# Patient Record
Sex: Female | Born: 1950
Health system: Southern US, Community
[De-identification: ages and names within clinical notes are randomized; demographics above are authoritative.]

## PROBLEM LIST (undated history)

## (undated) DIAGNOSIS — T7840XA Allergy, unspecified, initial encounter: Secondary | ICD-10-CM

## (undated) DIAGNOSIS — R7303 Prediabetes: Secondary | ICD-10-CM

## (undated) DIAGNOSIS — G43909 Migraine, unspecified, not intractable, without status migrainosus: Secondary | ICD-10-CM

## (undated) DIAGNOSIS — Z87442 Personal history of urinary calculi: Secondary | ICD-10-CM

## (undated) DIAGNOSIS — I10 Essential (primary) hypertension: Secondary | ICD-10-CM

## (undated) DIAGNOSIS — K5730 Diverticulosis of large intestine without perforation or abscess without bleeding: Secondary | ICD-10-CM

## (undated) DIAGNOSIS — J449 Chronic obstructive pulmonary disease, unspecified: Secondary | ICD-10-CM

## (undated) DIAGNOSIS — H9192 Unspecified hearing loss, left ear: Secondary | ICD-10-CM

## (undated) DIAGNOSIS — E876 Hypokalemia: Secondary | ICD-10-CM

## (undated) DIAGNOSIS — F411 Generalized anxiety disorder: Principal | ICD-10-CM

## (undated) DIAGNOSIS — M1990 Unspecified osteoarthritis, unspecified site: Secondary | ICD-10-CM

## (undated) DIAGNOSIS — J432 Centrilobular emphysema: Secondary | ICD-10-CM

## (undated) DIAGNOSIS — H8109 Meniere's disease, unspecified ear: Secondary | ICD-10-CM

## (undated) DIAGNOSIS — E785 Hyperlipidemia, unspecified: Secondary | ICD-10-CM

## (undated) DIAGNOSIS — K219 Gastro-esophageal reflux disease without esophagitis: Secondary | ICD-10-CM

## (undated) DIAGNOSIS — F32A Depression, unspecified: Secondary | ICD-10-CM

## (undated) DIAGNOSIS — F17200 Nicotine dependence, unspecified, uncomplicated: Secondary | ICD-10-CM

## (undated) DIAGNOSIS — J189 Pneumonia, unspecified organism: Secondary | ICD-10-CM

## (undated) DIAGNOSIS — E119 Type 2 diabetes mellitus without complications: Secondary | ICD-10-CM

## (undated) DIAGNOSIS — I6529 Occlusion and stenosis of unspecified carotid artery: Secondary | ICD-10-CM

## (undated) DIAGNOSIS — R519 Headache, unspecified: Secondary | ICD-10-CM

## (undated) DIAGNOSIS — H269 Unspecified cataract: Secondary | ICD-10-CM

## (undated) DIAGNOSIS — R42 Dizziness and giddiness: Secondary | ICD-10-CM

## (undated) DIAGNOSIS — M549 Dorsalgia, unspecified: Secondary | ICD-10-CM

## (undated) DIAGNOSIS — E039 Hypothyroidism, unspecified: Secondary | ICD-10-CM

## (undated) HISTORY — DX: Dorsalgia, unspecified: M54.9

## (undated) HISTORY — PX: UPPER GI ENDOSCOPY: SHX6162

## (undated) HISTORY — PX: TUBAL LIGATION: SHX77

## (undated) HISTORY — DX: Unspecified hearing loss, left ear: H91.92

## (undated) HISTORY — PX: APPENDECTOMY: SHX54

## (undated) HISTORY — DX: Centrilobular emphysema: J43.2

## (undated) HISTORY — DX: Unspecified osteoarthritis, unspecified site: M19.90

## (undated) HISTORY — DX: Meniere's disease, unspecified ear: H81.09

## (undated) HISTORY — PX: OTHER SURGICAL HISTORY: SHX169

## (undated) HISTORY — DX: Hypokalemia: E87.6

## (undated) HISTORY — DX: Occlusion and stenosis of unspecified carotid artery: I65.29

## (undated) HISTORY — DX: Hyperlipidemia, unspecified: E78.5

## (undated) HISTORY — DX: Nicotine dependence, unspecified, uncomplicated: F17.200

## (undated) HISTORY — DX: Unspecified cataract: H26.9

## (undated) HISTORY — DX: Diverticulosis of large intestine without perforation or abscess without bleeding: K57.30

## (undated) HISTORY — PX: COSMETIC SURGERY: SHX468

## (undated) HISTORY — DX: Migraine, unspecified, not intractable, without status migrainosus: G43.909

## (undated) HISTORY — PX: CHOLECYSTECTOMY: SHX55

## (undated) HISTORY — PX: COLONOSCOPY: SHX174

## (undated) HISTORY — DX: Generalized anxiety disorder: F41.1

## (undated) HISTORY — DX: Chronic obstructive pulmonary disease, unspecified: J44.9

## (undated) HISTORY — DX: Allergy, unspecified, initial encounter: T78.40XA

## (undated) HISTORY — DX: Dizziness and giddiness: R42

---

## 1990-01-01 HISTORY — PX: OTHER SURGICAL HISTORY: SHX169

## 2001-01-03 ENCOUNTER — Encounter: Payer: Self-pay | Admitting: Emergency Medicine

## 2001-01-03 ENCOUNTER — Emergency Department (HOSPITAL_COMMUNITY): Admission: EM | Admit: 2001-01-03 | Discharge: 2001-01-03 | Payer: Self-pay | Admitting: Emergency Medicine

## 2003-12-22 ENCOUNTER — Ambulatory Visit: Payer: Self-pay | Admitting: Internal Medicine

## 2004-02-09 ENCOUNTER — Ambulatory Visit: Payer: Self-pay | Admitting: Gastroenterology

## 2004-02-18 ENCOUNTER — Ambulatory Visit: Payer: Self-pay | Admitting: Gastroenterology

## 2004-07-12 ENCOUNTER — Ambulatory Visit: Payer: Self-pay | Admitting: Internal Medicine

## 2004-07-20 ENCOUNTER — Other Ambulatory Visit: Admission: RE | Admit: 2004-07-20 | Discharge: 2004-07-20 | Payer: Self-pay | Admitting: Internal Medicine

## 2004-07-20 ENCOUNTER — Ambulatory Visit: Payer: Self-pay | Admitting: Internal Medicine

## 2004-10-20 ENCOUNTER — Ambulatory Visit: Payer: Self-pay | Admitting: Internal Medicine

## 2005-02-08 ENCOUNTER — Ambulatory Visit: Payer: Self-pay | Admitting: Internal Medicine

## 2006-05-30 ENCOUNTER — Emergency Department (HOSPITAL_COMMUNITY): Admission: EM | Admit: 2006-05-30 | Discharge: 2006-05-30 | Payer: Self-pay | Admitting: Emergency Medicine

## 2006-06-04 ENCOUNTER — Ambulatory Visit: Payer: Self-pay | Admitting: Internal Medicine

## 2006-06-05 ENCOUNTER — Encounter: Payer: Self-pay | Admitting: Internal Medicine

## 2006-06-05 DIAGNOSIS — F419 Anxiety disorder, unspecified: Secondary | ICD-10-CM | POA: Insufficient documentation

## 2006-06-05 DIAGNOSIS — E785 Hyperlipidemia, unspecified: Secondary | ICD-10-CM | POA: Insufficient documentation

## 2006-06-05 DIAGNOSIS — R42 Dizziness and giddiness: Secondary | ICD-10-CM | POA: Insufficient documentation

## 2006-06-05 DIAGNOSIS — K5730 Diverticulosis of large intestine without perforation or abscess without bleeding: Secondary | ICD-10-CM

## 2006-06-05 DIAGNOSIS — F411 Generalized anxiety disorder: Secondary | ICD-10-CM | POA: Insufficient documentation

## 2006-06-05 HISTORY — DX: Generalized anxiety disorder: F41.1

## 2006-06-05 HISTORY — DX: Hyperlipidemia, unspecified: E78.5

## 2006-06-05 HISTORY — DX: Dizziness and giddiness: R42

## 2006-06-05 HISTORY — DX: Diverticulosis of large intestine without perforation or abscess without bleeding: K57.30

## 2006-06-18 ENCOUNTER — Ambulatory Visit: Payer: Self-pay | Admitting: Internal Medicine

## 2006-06-18 LAB — CONVERTED CEMR LAB
ALT: 15 units/L (ref 0–40)
AST: 21 units/L (ref 0–37)
Albumin: 4.1 g/dL (ref 3.5–5.2)
Alkaline Phosphatase: 81 units/L (ref 39–117)
BUN: 8 mg/dL (ref 6–23)
Basophils Absolute: 0.1 10*3/uL (ref 0.0–0.1)
Basophils Relative: 0.9 % (ref 0.0–1.0)
Bilirubin, Direct: 0.1 mg/dL (ref 0.0–0.3)
CO2: 32 meq/L (ref 19–32)
Calcium: 10.2 mg/dL (ref 8.4–10.5)
Chloride: 103 meq/L (ref 96–112)
Creatinine, Ser: 0.8 mg/dL (ref 0.4–1.2)
Eosinophils Absolute: 0.3 10*3/uL (ref 0.0–0.6)
Eosinophils Relative: 3.1 % (ref 0.0–5.0)
GFR calc Af Amer: 96 mL/min
GFR calc non Af Amer: 79 mL/min
Glucose, Bld: 110 mg/dL — ABNORMAL HIGH (ref 70–99)
HCT: 41.2 % (ref 36.0–46.0)
Hemoglobin: 14.3 g/dL (ref 12.0–15.0)
Hgb A1c MFr Bld: 6.5 % — ABNORMAL HIGH (ref 4.6–6.0)
Lipase: 25 units/L (ref 11.0–59.0)
Lymphocytes Relative: 24.6 % (ref 12.0–46.0)
MCHC: 34.8 g/dL (ref 30.0–36.0)
MCV: 86.7 fL (ref 78.0–100.0)
Monocytes Absolute: 0.7 10*3/uL (ref 0.2–0.7)
Monocytes Relative: 7.9 % (ref 3.0–11.0)
Neutro Abs: 5.5 10*3/uL (ref 1.4–7.7)
Neutrophils Relative %: 63.5 % (ref 43.0–77.0)
Platelets: 350 10*3/uL (ref 150–400)
Potassium: 4.3 meq/L (ref 3.5–5.1)
RBC: 4.76 M/uL (ref 3.87–5.11)
RDW: 13 % (ref 11.5–14.6)
Sodium: 141 meq/L (ref 135–145)
TSH: 1.85 microintl units/mL (ref 0.35–5.50)
Total Bilirubin: 0.3 mg/dL (ref 0.3–1.2)
Total Protein: 7.7 g/dL (ref 6.0–8.3)
WBC: 8.7 10*3/uL (ref 4.5–10.5)

## 2006-06-25 ENCOUNTER — Ambulatory Visit: Payer: Self-pay | Admitting: Internal Medicine

## 2006-06-25 ENCOUNTER — Encounter: Payer: Self-pay | Admitting: Internal Medicine

## 2006-06-25 DIAGNOSIS — H8109 Meniere's disease, unspecified ear: Secondary | ICD-10-CM | POA: Insufficient documentation

## 2006-06-25 HISTORY — DX: Meniere's disease, unspecified ear: H81.09

## 2007-01-06 ENCOUNTER — Encounter: Payer: Self-pay | Admitting: Internal Medicine

## 2007-04-22 ENCOUNTER — Encounter: Payer: Self-pay | Admitting: Internal Medicine

## 2007-11-10 ENCOUNTER — Telehealth: Payer: Self-pay | Admitting: Internal Medicine

## 2007-11-18 ENCOUNTER — Ambulatory Visit: Payer: Self-pay | Admitting: Internal Medicine

## 2007-11-18 LAB — CONVERTED CEMR LAB
ALT: 21 units/L (ref 0–35)
AST: 24 units/L (ref 0–37)
Albumin: 4 g/dL (ref 3.5–5.2)
Alkaline Phosphatase: 66 units/L (ref 39–117)
BUN: 16 mg/dL (ref 6–23)
Basophils Absolute: 0.1 10*3/uL (ref 0.0–0.1)
Basophils Relative: 0.7 % (ref 0.0–3.0)
Bilirubin Urine: NEGATIVE
Bilirubin, Direct: 0.1 mg/dL (ref 0.0–0.3)
CO2: 32 meq/L (ref 19–32)
Calcium: 9.6 mg/dL (ref 8.4–10.5)
Chloride: 97 meq/L (ref 96–112)
Cholesterol: 243 mg/dL (ref 0–200)
Creatinine, Ser: 0.8 mg/dL (ref 0.4–1.2)
Direct LDL: 151.2 mg/dL
Eosinophils Absolute: 0.3 10*3/uL (ref 0.0–0.7)
Eosinophils Relative: 3.5 % (ref 0.0–5.0)
GFR calc Af Amer: 95 mL/min
GFR calc non Af Amer: 79 mL/min
Glucose, Bld: 116 mg/dL — ABNORMAL HIGH (ref 70–99)
HCT: 44.7 % (ref 36.0–46.0)
HDL: 45.4 mg/dL (ref >39.0)
Hemoglobin, Urine: NEGATIVE
Hemoglobin: 15.7 g/dL — ABNORMAL HIGH (ref 12.0–15.0)
Leukocytes, UA: NEGATIVE
Lymphocytes Relative: 29.3 % (ref 12.0–46.0)
MCHC: 35 g/dL (ref 30.0–36.0)
MCV: 87 fL (ref 78.0–100.0)
Monocytes Absolute: 0.6 10*3/uL (ref 0.1–1.0)
Monocytes Relative: 7.8 % (ref 3.0–12.0)
Neutro Abs: 4.2 10*3/uL (ref 1.4–7.7)
Neutrophils Relative %: 58.7 % (ref 43.0–77.0)
Nitrite: NEGATIVE
Platelets: 358 10*3/uL (ref 150–400)
Potassium: 3.1 meq/L — ABNORMAL LOW (ref 3.5–5.1)
RBC: 5.13 M/uL — ABNORMAL HIGH (ref 3.87–5.11)
RDW: 12.3 % (ref 11.5–14.6)
Sodium: 138 meq/L (ref 135–145)
Specific Gravity, Urine: 1.01 (ref 1.000–1.03)
TSH: 2.41 microintl units/mL (ref 0.35–5.50)
Total Bilirubin: 0.2 mg/dL — ABNORMAL LOW (ref 0.3–1.2)
Total CHOL/HDL Ratio: 5.4
Total Protein, Urine: NEGATIVE mg/dL
Total Protein: 7.3 g/dL (ref 6.0–8.3)
Triglycerides: 119 mg/dL (ref 0–149)
Urine Glucose: NEGATIVE mg/dL
Urobilinogen, UA: 1 (ref 0.0–1.0)
VLDL: 24 mg/dL (ref 0–40)
WBC: 7.4 10*3/uL (ref 4.5–10.5)
pH: 7 (ref 5.0–8.0)

## 2007-11-25 ENCOUNTER — Ambulatory Visit: Payer: Self-pay | Admitting: Internal Medicine

## 2007-11-25 DIAGNOSIS — F17200 Nicotine dependence, unspecified, uncomplicated: Secondary | ICD-10-CM

## 2007-11-25 HISTORY — DX: Nicotine dependence, unspecified, uncomplicated: F17.200

## 2008-05-25 ENCOUNTER — Telehealth: Payer: Self-pay | Admitting: Internal Medicine

## 2008-12-07 ENCOUNTER — Ambulatory Visit: Payer: Self-pay | Admitting: Internal Medicine

## 2008-12-07 ENCOUNTER — Telehealth (INDEPENDENT_AMBULATORY_CARE_PROVIDER_SITE_OTHER): Payer: Self-pay

## 2008-12-07 LAB — CONVERTED CEMR LAB
ALT: 24 units/L (ref 0–35)
AST: 30 units/L (ref 0–37)
Albumin: 4.1 g/dL (ref 3.5–5.2)
Alkaline Phosphatase: 66 units/L (ref 39–117)
BUN: 19 mg/dL (ref 6–23)
Basophils Absolute: 0.1 10*3/uL (ref 0.0–0.1)
Basophils Relative: 0.9 % (ref 0.0–3.0)
Bilirubin Urine: NEGATIVE
Bilirubin, Direct: 0.1 mg/dL (ref 0.0–0.3)
CO2: 32 meq/L (ref 19–32)
Calcium: 9.6 mg/dL (ref 8.4–10.5)
Chloride: 95 meq/L — ABNORMAL LOW (ref 96–112)
Cholesterol: 194 mg/dL (ref 0–200)
Creatinine, Ser: 0.9 mg/dL (ref 0.4–1.2)
Eosinophils Absolute: 0.3 10*3/uL (ref 0.0–0.7)
Eosinophils Relative: 4.4 % (ref 0.0–5.0)
GFR calc non Af Amer: 68.55 mL/min (ref >60)
Glucose, Bld: 99 mg/dL (ref 70–99)
HCT: 47.2 % — ABNORMAL HIGH (ref 36.0–46.0)
HDL: 43.2 mg/dL (ref >39.00)
Hemoglobin, Urine: NEGATIVE
Hemoglobin: 16.1 g/dL — ABNORMAL HIGH (ref 12.0–15.0)
Ketones, ur: NEGATIVE mg/dL
LDL Cholesterol: 128 mg/dL — ABNORMAL HIGH (ref 0–99)
Leukocytes, UA: NEGATIVE
Lymphocytes Relative: 26.7 % (ref 12.0–46.0)
Lymphs Abs: 2.1 10*3/uL (ref 0.7–4.0)
MCHC: 34.2 g/dL (ref 30.0–36.0)
MCV: 90.8 fL (ref 78.0–100.0)
Monocytes Absolute: 0.6 10*3/uL (ref 0.1–1.0)
Monocytes Relative: 8.2 % (ref 3.0–12.0)
Neutro Abs: 4.6 10*3/uL (ref 1.4–7.7)
Neutrophils Relative %: 59.8 % (ref 43.0–77.0)
Nitrite: NEGATIVE
Platelets: 317 10*3/uL (ref 150.0–400.0)
Potassium: 2.8 meq/L — CL (ref 3.5–5.1)
RBC: 5.2 M/uL — ABNORMAL HIGH (ref 3.87–5.11)
RDW: 12.2 % (ref 11.5–14.6)
Sodium: 136 meq/L (ref 135–145)
Specific Gravity, Urine: 1.01 (ref 1.000–1.030)
TSH: 2.43 microintl units/mL (ref 0.35–5.50)
Total Bilirubin: 0.6 mg/dL (ref 0.3–1.2)
Total CHOL/HDL Ratio: 4
Total Protein, Urine: NEGATIVE mg/dL
Total Protein: 7.3 g/dL (ref 6.0–8.3)
Triglycerides: 112 mg/dL (ref 0.0–149.0)
Urine Glucose: NEGATIVE mg/dL
Urobilinogen, UA: 0.2 (ref 0.0–1.0)
VLDL: 22.4 mg/dL (ref 0.0–40.0)
WBC: 7.7 10*3/uL (ref 4.5–10.5)
pH: 7.5 (ref 5.0–8.0)

## 2008-12-14 ENCOUNTER — Ambulatory Visit: Payer: Self-pay | Admitting: Internal Medicine

## 2008-12-14 DIAGNOSIS — E876 Hypokalemia: Secondary | ICD-10-CM | POA: Insufficient documentation

## 2008-12-14 HISTORY — DX: Hypokalemia: E87.6

## 2008-12-14 LAB — CONVERTED CEMR LAB
BUN: 8 mg/dL (ref 6–23)
CO2: 31 meq/L (ref 19–32)
Calcium: 9.5 mg/dL (ref 8.4–10.5)
Chloride: 99 meq/L (ref 96–112)
Creatinine, Ser: 0.7 mg/dL (ref 0.4–1.2)
GFR calc non Af Amer: 91.6 mL/min (ref >60)
Glucose, Bld: 75 mg/dL (ref 70–99)
Potassium: 3.6 meq/L (ref 3.5–5.1)
Sodium: 137 meq/L (ref 135–145)

## 2008-12-15 ENCOUNTER — Telehealth: Payer: Self-pay | Admitting: Internal Medicine

## 2008-12-20 ENCOUNTER — Telehealth: Payer: Self-pay | Admitting: Internal Medicine

## 2009-01-05 ENCOUNTER — Encounter: Payer: Self-pay | Admitting: Internal Medicine

## 2009-07-26 ENCOUNTER — Telehealth: Payer: Self-pay | Admitting: Internal Medicine

## 2009-10-27 ENCOUNTER — Telehealth: Payer: Self-pay | Admitting: Internal Medicine

## 2010-01-05 ENCOUNTER — Ambulatory Visit
Admission: RE | Admit: 2010-01-05 | Discharge: 2010-01-05 | Payer: Self-pay | Source: Home / Self Care | Attending: Internal Medicine | Admitting: Internal Medicine

## 2010-01-05 DIAGNOSIS — M549 Dorsalgia, unspecified: Secondary | ICD-10-CM

## 2010-01-05 DIAGNOSIS — M545 Low back pain, unspecified: Secondary | ICD-10-CM | POA: Insufficient documentation

## 2010-01-05 HISTORY — DX: Dorsalgia, unspecified: M54.9

## 2010-01-06 ENCOUNTER — Telehealth: Payer: Self-pay | Admitting: Internal Medicine

## 2010-02-02 NOTE — Progress Notes (Signed)
Summary: refill alprazolam  Phone Note Refill Request Message from:  Fax from Pharmacy on July 26, 2009 12:55 PM  Refills Requested: Medication #1:  ALPRAZOLAM 0.25 MG  TABS 1 three times a day as needed   Last Refilled: 06/25/2009   Notes: #90 w/ 5 refills  Method Requested: Electronic Initial call taken by: Trixie Dredge,  July 26, 2009 12:56 PM    Prescriptions: ALPRAZOLAM 0.25 MG  TABS (ALPRAZOLAM) 1 three times a day as needed  #90 x 2   Entered by:   Duard Brady LPN   Authorized by:   Gordy Savers  MD   Signed by:   Duard Brady LPN on 16/10/9602   Method used:   Historical   RxID:   [id number]  [facility] [place]

## 2010-02-02 NOTE — Progress Notes (Signed)
Summary: refill xanax  Phone Note From Pharmacy   Caller: [facility] Call For: [facility]  Summary of Call: refill xanax 0.25mg  1 by mouth three times a day as needed Initial call taken by: Alfred Levins, CMA,  October 27, 2009 8:41 AM  Follow-up for Phone Call        #90  RF 3 Follow-up by: Gordy Savers  MD,  October 27, 2009 11:22 AM  Additional Follow-up for Phone Call Additional follow up Details #1::        Phone call completed, Pharmacist called Additional Follow-up by: Alfred Levins, CMA,  October 27, 2009 11:24 AM    Prescriptions: ALPRAZOLAM 0.25 MG  TABS (ALPRAZOLAM) 1 three times a day as needed  #90 x 3   Entered by:   Alfred Levins, CMA   Authorized by:   Gordy Savers  MD   Signed by:   Alfred Levins, CMA on 10/27/2009   Method used:   [facility]phoned to ...       [facility]  [address] [id number]       [address]       [place], [place]  [address]       Ph: [phone] or [phone]       Fax: [phone]   RxID:   [id number]

## 2010-02-02 NOTE — Assessment & Plan Note (Signed)
Summary: consult re: lower back pain since Saturday/pressue/cjr   Vital Signs:  Patient profile:   60 year old female Weight:      133 pounds Temp:     98.1 degrees F oral BP sitting:   120 / 80  (right arm) Cuff size:   regular  Vitals Entered By: Duard Brady LPN (January 05, 2010 8:52 AM) CC: c/o back pain on/off for awhile Is Patient Diabetic? No   CC:  c/o back pain on/off for awhile.  History of Present Illness: 60 -year-old patient seen today with a chief complaint of low back pain.  Five days ago she fell a sleep on a heating pad and cannot reposition and she awoke with low back pain.  She has been using a prescription of meloxicam and has enjoyed daily improvement.  No radicular symptoms.  She has a history of dyslipidemia and hypertension, which have been stable.  Allergies: 1)  ! Penicillin V Potassium (Penicillin V Potassium) 2)  ! Erythromycin Base (Erythromycin Base)  Past History:  Past Medical History: Reviewed history from 11/25/2007 and no changes required. Anxiety Diverticulosis, colon Dizziness or vertigo Hyperlipidemia Mnire's disease tobacco abuse impaired glucose tolerance  Past Surgical History: Reviewed history from 11/25/2007 and no changes required. Appendectomy 1996 Caesarean section  gravida one, para one, abortus one Cholecystectomy laparoscopic 1997 Tubal ligation  Mastoid surgery status post laparoscopy for ovarian cyst surgery for vocal cord nodule status post carpal tunnel release  colonoscopy January 2006  Family History: Reviewed history from 11/25/2007 and no changes required. mother is age 59, with a history of bilateral breast cancer and cerebrovascular disease  Father age 7  One sister in good health  A number of aunts and uncles with the diabetes  Review of Systems  The patient denies anorexia, fever, weight loss, weight gain, vision loss, decreased hearing, hoarseness, chest pain, syncope, dyspnea on  exertion, peripheral edema, prolonged cough, headaches, hemoptysis, abdominal pain, melena, hematochezia, severe indigestion/heartburn, hematuria, incontinence, genital sores, muscle weakness, suspicious skin lesions, transient blindness, difficulty walking, depression, unusual weight change, abnormal bleeding, enlarged lymph nodes, and angioedema.    Physical Exam  General:  Well-developed,well-nourished,in no acute distress; alert,appropriate and cooperative throughout examination Head:  Normocephalic and atraumatic without obvious abnormalities. No apparent alopecia or balding. Eyes:  No corneal or conjunctival inflammation noted. EOMI. Perrla. Funduscopic exam benign, without hemorrhages, exudates or papilledema. Vision grossly normal. Mouth:  Oral mucosa and oropharynx without lesions or exudates.  Teeth in good repair. Neck:  No deformities, masses, or tenderness noted. Lungs:  Normal respiratory effort, chest expands symmetrically. Lungs are clear to auscultation, no crackles or wheezes. Heart:  Normal rate and regular rhythm. S1 and S2 normal without gallop, murmur, click, rub or other extra sounds. Abdomen:  Bowel sounds positive,abdomen soft and non-tender without masses, organomegaly or hernias noted. Msk:  negative straight leg test.  Full range of motion of the hips   Impression & Recommendations:  Problem # 1:  HYPERLIPIDEMIA (ICD-272.4)  Her updated medication list for this problem includes:    Lipitor 10 Mg Tabs (Atorvastatin calcium) .Marland Kitchen... 1 once daily  Her updated medication list for this problem includes:    Lipitor 10 Mg Tabs (Atorvastatin calcium) .Marland Kitchen... 1 once daily  Problem # 2:  BACK PAIN (ICD-724.5)  Her updated medication list for this problem includes:    Meloxicam 7.5 Mg Tabs (Meloxicam) .Marland Kitchen..Marland Kitchen Two times a day prn  Her updated medication list for this problem includes:  Meloxicam 7.5 Mg Tabs (Meloxicam) .Marland Kitchen..Marland Kitchen Two times a day prn  Complete Medication  List: 1)  Prozac 20 Mg Caps (Fluoxetine hcl) .Marland Kitchen.. 1 once daily 2)  Alprazolam 0.25 Mg Tabs (Alprazolam) .Marland Kitchen.. 1 three times a day as needed 3)  Chlorthalidone 25 Mg Tabs (Chlorthalidone) .Marland Kitchen.. 1 once daily 4)  Lipitor 10 Mg Tabs (Atorvastatin calcium) .Marland Kitchen.. 1 once daily 5)  Xanax 0.25 Mg Tabs (Alprazolam) .... Take 1/2-1 tablet by mouth three times a day 6)  Doxycycline Hyclate 100 Mg Caps (Doxycycline hyclate) .... One two times a day. 7)  Meloxicam 7.5 Mg Tabs (Meloxicam) .... Two times a day prn 8)  Klor-con M20 20 Meq Cr-tabs (Potassium chloride crys cr) .... One daily  Other Orders: Psychology Referral (Psychology)  Patient Instructions: 1)  You may move around but avoid painful motions. Apply  heat  to sore area for 20 minutes 3-4 times a day for 2-3 days. 2)  Please schedule a follow-up appointment in 3 months for annual exam  Prescriptions: KLOR-CON M20 20 MEQ CR-TABS (POTASSIUM CHLORIDE CRYS CR) one daily  #90 x 3   Entered and Authorized by:   Gordy Savers  MD   Signed by:   Gordy Savers  MD on 01/05/2010   Method used:   Print then Give to Patient   RxID:   [id number] MELOXICAM 7.5 MG TABS (MELOXICAM) two times a day prn  #90 x 2   Entered and Authorized by:   Gordy Savers  MD   Signed by:   Gordy Savers  MD on 01/05/2010   Method used:   Print then Give to Patient   RxID:   [id number] ALPRAZOLAM 0.25 MG  TABS (ALPRAZOLAM) 1 three times a day as needed  #90 x 3   Entered and Authorized by:   Gordy Savers  MD   Signed by:   Gordy Savers  MD on 01/05/2010   Method used:   Print then Give to Patient   RxID:   [id number] LIPITOR 10 MG  TABS (ATORVASTATIN CALCIUM) 1 once daily  #90 x 6   Entered and Authorized by:   Gordy Savers  MD   Signed by:   Gordy Savers  MD on 01/05/2010   Method used:   Print then Give to Patient   RxID:   [id number] CHLORTHALIDONE 25 MG  TABS (CHLORTHALIDONE) 1 once daily   #90 x 6   Entered and Authorized by:   Gordy Savers  MD   Signed by:   Gordy Savers  MD on 01/05/2010   Method used:   Print then Give to Patient   RxID:   [id number] PROZAC 20 MG  CAPS (FLUOXETINE HCL) 1 once daily  #90 x 6   Entered and Authorized by:   Gordy Savers  MD   Signed by:   Gordy Savers  MD on 01/05/2010   Method used:   Print then Give to Patient   RxID:   [id number]    Orders Added: 1)  Est. Patient Level III [23762] 2)  Psychology Referral [Psychology]

## 2010-02-02 NOTE — Progress Notes (Signed)
Summary: back pain - meds  Phone Note Call from Patient   Caller: Patient Call For: Gordy Savers  MD Summary of Call: Pt has more pain in lower back and wants stronger pain meds, and muscle relaxers.  Had a terrible night. [phone] Initial call taken by: Lynann Beaver CMA AAMA,  January 06, 2010 4:14 PM  Follow-up for Phone Call        generic Vicodin 5/500, number 50, 1 every 6 hours as needed for pain Follow-up by: Gordy Savers  MD,  January 06, 2010 5:18 PM  Additional Follow-up for Phone Call Additional follow up Details #1::        change to med list , [facility] - [facility] aware KIK Additional Follow-up by: Duard Brady LPN,  January 06, 2010 5:24 PM    New/Updated Medications: VICODIN 5-500 MG TABS (HYDROCODONE-ACETAMINOPHEN) 1 by mouth q6hrs as needed pain Prescriptions: VICODIN 5-500 MG TABS (HYDROCODONE-ACETAMINOPHEN) 1 by mouth q6hrs as needed pain  #50 x 0   Entered by:   Duard Brady LPN   Authorized by:   Gordy Savers  MD   Signed by:   Duard Brady LPN on 45/40/9811   Method used:   Historical   RxID:   [id number]

## 2010-03-06 ENCOUNTER — Other Ambulatory Visit: Payer: Self-pay

## 2010-03-06 NOTE — Telephone Encounter (Signed)
recv'd [facility] - denied - was given printed rx in office 01/05/2010 with refills. KIK

## 2010-04-20 ENCOUNTER — Other Ambulatory Visit: Payer: Self-pay | Admitting: Internal Medicine

## 2010-04-20 ENCOUNTER — Other Ambulatory Visit: Payer: Self-pay

## 2010-04-20 DIAGNOSIS — Z0000 Encounter for general adult medical examination without abnormal findings: Secondary | ICD-10-CM

## 2010-04-27 ENCOUNTER — Encounter: Payer: Self-pay | Admitting: Internal Medicine

## 2010-05-11 ENCOUNTER — Ambulatory Visit (INDEPENDENT_AMBULATORY_CARE_PROVIDER_SITE_OTHER): Payer: Managed Care, Other (non HMO) | Admitting: Internal Medicine

## 2010-05-11 ENCOUNTER — Encounter: Payer: Self-pay | Admitting: Internal Medicine

## 2010-05-11 ENCOUNTER — Other Ambulatory Visit (INDEPENDENT_AMBULATORY_CARE_PROVIDER_SITE_OTHER): Payer: Managed Care, Other (non HMO)

## 2010-05-11 DIAGNOSIS — F17200 Nicotine dependence, unspecified, uncomplicated: Secondary | ICD-10-CM

## 2010-05-11 DIAGNOSIS — Z0000 Encounter for general adult medical examination without abnormal findings: Secondary | ICD-10-CM

## 2010-05-11 DIAGNOSIS — J45909 Unspecified asthma, uncomplicated: Secondary | ICD-10-CM

## 2010-05-11 DIAGNOSIS — F411 Generalized anxiety disorder: Secondary | ICD-10-CM

## 2010-05-11 LAB — HEPATIC FUNCTION PANEL
ALT: 38 U/L — ABNORMAL HIGH (ref 0–35)
AST: 38 U/L — ABNORMAL HIGH (ref 0–37)
Albumin: 3.7 g/dL (ref 3.5–5.2)
Alkaline Phosphatase: 51 U/L (ref 39–117)

## 2010-05-11 LAB — BASIC METABOLIC PANEL
CO2: 33 mEq/L — ABNORMAL HIGH (ref 19–32)
Calcium: 9.4 mg/dL (ref 8.4–10.5)
Creatinine, Ser: 0.7 mg/dL (ref 0.4–1.2)
Sodium: 139 mEq/L (ref 135–145)

## 2010-05-11 LAB — URINALYSIS
Hgb urine dipstick: NEGATIVE
Leukocytes, UA: NEGATIVE
Nitrite: NEGATIVE
Total Protein, Urine: NEGATIVE
pH: 7.5 (ref 5.0–8.0)

## 2010-05-11 LAB — CBC WITH DIFFERENTIAL/PLATELET
Basophils Relative: 0.5 % (ref 0.0–3.0)
Eosinophils Absolute: 0.5 10*3/uL (ref 0.0–0.7)
Eosinophils Relative: 6.5 % — ABNORMAL HIGH (ref 0.0–5.0)
Hemoglobin: 13.9 g/dL (ref 12.0–15.0)
Lymphocytes Relative: 27.8 % (ref 12.0–46.0)
MCHC: 34.9 g/dL (ref 30.0–36.0)
Monocytes Relative: 7.7 % (ref 3.0–12.0)
Neutro Abs: 4.1 10*3/uL (ref 1.4–7.7)
Neutrophils Relative %: 57.5 % (ref 43.0–77.0)
RBC: 4.52 Mil/uL (ref 3.87–5.11)
WBC: 7.1 10*3/uL (ref 4.5–10.5)

## 2010-05-11 LAB — LIPID PANEL
Cholesterol: 144 mg/dL (ref 0–200)
LDL Cholesterol: 76 mg/dL (ref 0–99)

## 2010-05-11 LAB — TSH: TSH: 2.35 u[IU]/mL (ref 0.35–5.50)

## 2010-05-11 MED ORDER — HYDROCODONE-HOMATROPINE 5-1.5 MG/5ML PO SYRP: 5.0000 mL | ORAL_SOLUTION | Freq: Four times a day (QID) | ORAL | Status: AC | PRN

## 2010-05-11 MED ORDER — DOXYCYCLINE HYCLATE 100 MG PO TABS: 100.0000 mg | ORAL_TABLET | Freq: Two times a day (BID) | ORAL | Status: AC

## 2010-05-11 NOTE — Progress Notes (Signed)
  Subjective:    Patient ID: Allison Jimenez, female    DOB: 03/03/1950, 60 y.o.   MRN: [id number]  HPI 60 year old patient who has a history of ongoing tobacco use. For the past few months she has cut her tobacco consumption down dramatically. 5 days ago she had the onset of cough wheezing and fever to 101.5. Cough is still productive of purulent material; over the past couple days she has had increasing wheezing.    Review of Systems  Constitutional: Positive for fever, appetite change and fatigue. Negative for chills and diaphoresis.  HENT: Positive for congestion. Negative for hearing loss, sore throat, rhinorrhea, dental problem, sinus pressure and tinnitus.   Eyes: Negative for pain, discharge and visual disturbance.  Respiratory: Positive for cough and wheezing. Negative for shortness of breath.   Cardiovascular: Negative for chest pain, palpitations and leg swelling.  Gastrointestinal: Negative for nausea, vomiting, abdominal pain, diarrhea, constipation, blood in stool and abdominal distention.  Genitourinary: Negative for dysuria, urgency, frequency, hematuria, flank pain, vaginal bleeding, vaginal discharge, difficulty urinating, vaginal pain and pelvic pain.  Musculoskeletal: Negative for joint swelling, arthralgias and gait problem.  Skin: Negative for rash.  Neurological: Negative for dizziness, syncope, speech difficulty, weakness, numbness and headaches.  Hematological: Negative for adenopathy.  Psychiatric/Behavioral: Negative for behavioral problems, dysphoric mood and agitation. The patient is not nervous/anxious.        Objective:   Physical Exam  Constitutional: She is oriented to person, place, and time. She appears well-developed and well-nourished.  HENT:  Head: Normocephalic.  Right Ear: External ear normal.  Left Ear: External ear normal.  Mouth/Throat: Oropharynx is clear and moist.  Eyes: Conjunctivae and EOM are normal. Pupils are equal, round, and reactive  to light.  Neck: Normal range of motion. Neck supple. No thyromegaly present.  Cardiovascular: Normal rate, regular rhythm, normal heart sounds and intact distal pulses.   Pulmonary/Chest: Effort normal. She has wheezes.       O2 saturation 95% Pulse rate 74  Left chest was clear Scattered wheezing noted on the right  Abdominal: Soft. Bowel sounds are normal. She exhibits no mass. There is no tenderness.  Musculoskeletal: Normal range of motion.  Lymphadenopathy:    She has no cervical adenopathy.  Neurological: She is alert and oriented to person, place, and time.  Skin: Skin is warm and dry. No rash noted.  Psychiatric: She has a normal mood and affect. Her behavior is normal.          Assessment & Plan:    Acute asthmatic bronchitis Ongoing tobacco use. Improved  We'll treat with short term inhalational bronchodilators. We'll also treat with doxycycline. Total cessation of smoking encouraged

## 2010-05-11 NOTE — Patient Instructions (Signed)
Get plenty of rest, Drink lots of  clear liquids, and use Tylenol or ibuprofen for fever and discomfort.    Call or return to clinic prn if these symptoms worsen or fail to improve as anticipated.  

## 2010-05-18 ENCOUNTER — Ambulatory Visit (INDEPENDENT_AMBULATORY_CARE_PROVIDER_SITE_OTHER): Payer: Managed Care, Other (non HMO) | Admitting: Internal Medicine

## 2010-05-18 ENCOUNTER — Encounter: Payer: Self-pay | Admitting: Internal Medicine

## 2010-05-18 DIAGNOSIS — Z0000 Encounter for general adult medical examination without abnormal findings: Secondary | ICD-10-CM

## 2010-05-18 DIAGNOSIS — H8109 Meniere's disease, unspecified ear: Secondary | ICD-10-CM

## 2010-05-18 DIAGNOSIS — F17200 Nicotine dependence, unspecified, uncomplicated: Secondary | ICD-10-CM

## 2010-05-18 DIAGNOSIS — E785 Hyperlipidemia, unspecified: Secondary | ICD-10-CM

## 2010-05-18 DIAGNOSIS — F411 Generalized anxiety disorder: Secondary | ICD-10-CM

## 2010-05-18 DIAGNOSIS — Z23 Encounter for immunization: Secondary | ICD-10-CM

## 2010-05-18 MED ORDER — ALPRAZOLAM 0.25 MG PO TABS: 0.2500 mg | ORAL_TABLET | Freq: Three times a day (TID) | ORAL | Status: DC | PRN

## 2010-05-18 MED ORDER — ATORVASTATIN CALCIUM 10 MG PO TABS: 10.0000 mg | ORAL_TABLET | Freq: Every day | ORAL | Status: DC

## 2010-05-18 MED ORDER — FLUOXETINE HCL 20 MG PO CAPS: 20.0000 mg | ORAL_CAPSULE | Freq: Every day | ORAL | Status: DC

## 2010-05-18 MED ORDER — CHLORTHALIDONE 25 MG PO TABS: 25.0000 mg | ORAL_TABLET | Freq: Every day | ORAL | Status: DC

## 2010-05-18 MED ORDER — MELOXICAM 7.5 MG PO TABS: 7.5000 mg | ORAL_TABLET | Freq: Every day | ORAL | Status: DC

## 2010-05-18 NOTE — Progress Notes (Signed)
  Subjective:    Patient ID: Allison Jimenez, female    DOB: February 20, 1950, 60 y.o.   MRN: [id number]  HPI    Review of Systems     Objective:   Physical Exam        Assessment & Plan:

## 2010-05-18 NOTE — Patient Instructions (Signed)
Limit your sodium (Salt) intake    It is important that you exercise regularly, at least 20 minutes 3 to 4 times per week.  If you develop chest pain or shortness of breath seek  medical attention.  Return in 6 months for follow-up  Take a calcium supplement, plus 800-1200 units of vitamin D 

## 2010-05-18 NOTE — Progress Notes (Signed)
Subjective:    Patient ID: Allison Jimenez, female    DOB: [birth year], 60 y.o.   MRN: [id number]  HPI  60 year old patient who is seen today for an annual exam. She has a history of tobacco use but has dramatically decreased her tobacco consumption. She was seen one week ago with acute bronchitis and has improved. She has a history of anxiety disorder which has been stable. She is on Lipitor for mild dyslipidemia. Laboratory studies reviewed. No new concerns or complaints.   . History of Present Illness:  the 60 year old patient who is seen today for a wellness exam. Medical problems include dyslipidemia. She has undergoing tobacco use, and is followed by ENT for Mnire's disease. She has been on thiazide diuretics for her Mnire's. She has had some diuretic associated hypokalemia and presently is on supplementation. At the present time. She has been treated by ENT for a sinus infection. She has had some mild antibiotic associated diarrhea  Allergies:  1) ! Penicillin V Potassium (Penicillin V Potassium)  2) ! Erythromycin Base (Erythromycin Base)  Past History:  Past Medical History:  Reviewed history from 11/25/2007 and no changes required.  Anxiety  Diverticulosis, colon  Dizziness or vertigo  Hyperlipidemia  Mnire's disease  tobacco abuse  impaired glucose tolerance  Past Surgical History:  Reviewed history from 11/25/2007 and no changes required.  Appendectomy 1996  Caesarean section gravida one, para one, abortus one  Cholecystectomy laparoscopic 1997  Tubal ligation  Mastoid surgery  status post laparoscopy for ovarian cyst  surgery for vocal cord nodule  status post carpal tunnel release  colonoscopy January 2006  Family History:  Reviewed history from 11/25/2007 and no changes required.  mother is age 61, with a history of bilateral breast cancer and cerebrovascular disease  Father age 50  One sister in good health  A number of aunts and uncles with the diabetes    Social History:  Reviewed history from 11/25/2007 and no changes required.  Divorced  one adult son      Review of Systems  Constitutional: Negative for fever, appetite change, fatigue and unexpected weight change.  HENT: Negative for hearing loss, ear pain, nosebleeds, congestion, sore throat, mouth sores, trouble swallowing, neck stiffness, dental problem, voice change, sinus pressure and tinnitus.   Eyes: Negative for photophobia, pain, redness and visual disturbance.  Respiratory: Positive for cough. Negative for chest tightness and shortness of breath.   Cardiovascular: Negative for chest pain, palpitations and leg swelling.  Gastrointestinal: Negative for nausea, vomiting, abdominal pain, diarrhea, constipation, blood in stool, abdominal distention and rectal pain.  Genitourinary: Negative for dysuria, urgency, frequency, hematuria, flank pain, vaginal bleeding, vaginal discharge, difficulty urinating, genital sores, vaginal pain, menstrual problem and pelvic pain.  Musculoskeletal: Negative for back pain and arthralgias.  Skin: Negative for rash.  Neurological: Negative for dizziness, syncope, speech difficulty, weakness, light-headedness, numbness and headaches.  Hematological: Negative for adenopathy. Does not bruise/bleed easily.  Psychiatric/Behavioral: Negative for suicidal ideas, behavioral problems, self-injury, dysphoric mood and agitation. The patient is not nervous/anxious.        Objective:   Physical Exam  Constitutional: She is oriented to person, place, and time. She appears well-developed and well-nourished.  HENT:  Head: Normocephalic and atraumatic.  Right Ear: External ear normal.  Left Ear: External ear normal.  Mouth/Throat: Oropharynx is clear and moist.  Eyes: Conjunctivae and EOM are normal.  Neck: Normal range of motion. Neck supple. No JVD present. No thyromegaly present.  Cardiovascular:  Normal rate, regular rhythm, normal heart sounds and intact  distal pulses.   No murmur heard. Pulmonary/Chest: Effort normal. She has no wheezes. She has no rales.       Bilateral faint coarse rhonchi  Abdominal: Soft. Bowel sounds are normal. She exhibits no distension and no mass. There is no tenderness. There is no rebound and no guarding.  Musculoskeletal: Normal range of motion. She exhibits no edema and no tenderness.  Neurological: She is alert and oriented to person, place, and time. She has normal reflexes. No cranial nerve deficit. She exhibits normal muscle tone. Coordination normal.  Skin: Skin is warm and dry. No rash noted.  Psychiatric: She has a normal mood and affect. Her behavior is normal.          Assessment & Plan:    Annual health examination  Tobacco use. improved Dyslipidemia. Will continue Lipitor 10 mg daily  Total tobacco use encouraged. Regular  exercise encouraged

## 2010-10-27 ENCOUNTER — Encounter: Payer: Self-pay | Admitting: Internal Medicine

## 2010-10-27 ENCOUNTER — Ambulatory Visit (INDEPENDENT_AMBULATORY_CARE_PROVIDER_SITE_OTHER): Payer: Managed Care, Other (non HMO) | Admitting: Internal Medicine

## 2010-10-27 DIAGNOSIS — M549 Dorsalgia, unspecified: Secondary | ICD-10-CM

## 2010-10-27 DIAGNOSIS — F411 Generalized anxiety disorder: Secondary | ICD-10-CM

## 2010-10-27 DIAGNOSIS — R109 Unspecified abdominal pain: Secondary | ICD-10-CM

## 2010-10-27 NOTE — Patient Instructions (Signed)
Abdominal CT scan as discussed   It is important that you exercise regularly, at least 20 minutes 3 to 4 times per week.  If you develop chest pain or shortness of breath seek  medical attention.  Drink as much fluid as you  can tolerate over the next few days  High-fiber diet  Use a stool softener once or twice daily

## 2010-10-27 NOTE — Progress Notes (Signed)
  Subjective:    Patient ID: Allison Jimenez, female    DOB: November 25, 1950, 60 y.o.   MRN: [id number]  HPI  60 year old patient who is seen today with a chief complaint of right-sided abdominal pain and flank pain. This has been present for approximately 2 months. She has been under considerable situational stress due to the poor health of her mother who has expired area and she has had a prior cholecystectomy and appendectomy. She has been receiving routine gynecologic care from her gynecologist who has recently retired. She is occasionally constipated but no real change in her bowel habits. She complains of pain in the right lower quadrant and right flank area. She also has a history of prior kidney stones on the right but this pain is dissimilar. No change in her weight. She is on anti-inflammatories and analgesics which has not been very helpful. She has had a colonoscopy in January of 2006    Review of Systems  Constitutional: Negative.  Negative for appetite change, fatigue and unexpected weight change.  HENT: Negative for hearing loss, congestion, sore throat, rhinorrhea, dental problem, sinus pressure and tinnitus.   Eyes: Negative for pain, discharge and visual disturbance.  Respiratory: Negative for cough and shortness of breath.   Cardiovascular: Negative for chest pain, palpitations and leg swelling.  Gastrointestinal: Positive for abdominal pain and constipation. Negative for nausea, vomiting, diarrhea, blood in stool and abdominal distention.  Genitourinary: Negative for dysuria, urgency, frequency, hematuria, flank pain, vaginal bleeding, vaginal discharge, difficulty urinating, vaginal pain and pelvic pain.  Musculoskeletal: Negative for joint swelling, arthralgias and gait problem.  Skin: Negative for rash.  Neurological: Negative for dizziness, syncope, speech difficulty, weakness, numbness and headaches.  Hematological: Negative for adenopathy.  Psychiatric/Behavioral: Positive for  dysphoric mood. Negative for behavioral problems and agitation. The patient is not nervous/anxious.        Objective:   Physical Exam  Constitutional: She is oriented to person, place, and time. She appears well-developed and well-nourished.  HENT:  Head: Normocephalic.  Right Ear: External ear normal.  Left Ear: External ear normal.  Mouth/Throat: Oropharynx is clear and moist.       The patient had a ulcerated lesion of the hard palate. She has been followed by her dentist for a suspected inflammatory problem. She is scheduled for followup and will be referred to oral surgery if this does not resolve  Eyes: Conjunctivae and EOM are normal. Pupils are equal, round, and reactive to light.  Neck: Normal range of motion. Neck supple. No thyromegaly present.  Cardiovascular: Normal rate, regular rhythm, normal heart sounds and intact distal pulses.   Pulmonary/Chest: Effort normal and breath sounds normal.  Abdominal: Soft. Bowel sounds are normal. She exhibits no distension and no mass. There is no tenderness. There is no rebound and no guarding.  Musculoskeletal: Normal range of motion.  Lymphadenopathy:    She has no cervical adenopathy.  Neurological: She is alert and oriented to person, place, and time.  Skin: Skin is warm and dry. No rash noted.  Psychiatric: She has a normal mood and affect. Her behavior is normal.          Assessment & Plan:   Abdominal pain of 2 months duration. History of diverticulosis status post appendectomy and cholecystectomy. We'll proceed with abdominal pelvic CT scan. Episodic constipation. This was discussed. Liberal fluid intake high fiber diet and exercise all encouraged as well as a stool softener

## 2010-10-30 ENCOUNTER — Telehealth: Payer: Self-pay

## 2010-10-30 NOTE — Telephone Encounter (Signed)
Called pt to set up bun and creatine labs and pt stated she was "not sure if she was going to get the ct scan done at this time due to meeting her deductible.  Pt states she may wait until the beginning of the year.  Pt states she will call her insurance company and see how much it will cost her to have her scan.  Pt requested to have the number to our billing office and was given that information.  Pt states she will call our office and let us know if she decides to have the scan or wait until next year.

## 2010-10-30 NOTE — Telephone Encounter (Signed)
Pt is waning to wait till January to get CT scan for insurance purposes. Pt would like to know if it is ok for her to wait this long. Pt requesting you contact her

## 2010-11-01 NOTE — Telephone Encounter (Signed)
Spoke with pt - I ecouraged her that she needed to have this done before JAN. But i also understand it is a finacial issue for her. I will inform dr. Amador Cunas next week when he gets back and see if he feels it is ok to wait. Pt aware any n/v/d , fever or increase in pain - call or go to ER.

## 2010-11-08 NOTE — Telephone Encounter (Signed)
Attempt to call pt - ans mach - LMTCB if problems - informed per dr. Maggie Jimenez ok to wait until Jan as long as stable at this time . Forwarded to terri to inform .

## 2010-11-08 NOTE — Telephone Encounter (Signed)
Ok to defer ct if patient is stable

## 2010-11-28 ENCOUNTER — Other Ambulatory Visit: Payer: Self-pay

## 2010-11-28 MED ORDER — ALPRAZOLAM 0.25 MG PO TABS: 0.2500 mg | ORAL_TABLET | Freq: Three times a day (TID) | ORAL | Status: DC | PRN

## 2011-01-03 ENCOUNTER — Telehealth: Payer: Self-pay | Admitting: Internal Medicine

## 2011-01-03 NOTE — Telephone Encounter (Signed)
Spoke withpt- has ENT appt tomorrow - "this feeling like it did last time " - instructed that is no better , to see Friday or Monday . KIK

## 2011-01-03 NOTE — Telephone Encounter (Signed)
Pt is experiencing a stiff neck for 3 days. Pt is having issues sleeping and is having pain when she moves her head from side to side.  Pt has been taking Meloxicam and Asprin but hasn't relieved her pain. Pt requesting you contact her.

## 2011-01-08 ENCOUNTER — Ambulatory Visit (INDEPENDENT_AMBULATORY_CARE_PROVIDER_SITE_OTHER): Payer: Managed Care, Other (non HMO) | Admitting: Internal Medicine

## 2011-01-08 ENCOUNTER — Encounter: Payer: Self-pay | Admitting: Internal Medicine

## 2011-01-08 DIAGNOSIS — M549 Dorsalgia, unspecified: Secondary | ICD-10-CM

## 2011-01-08 DIAGNOSIS — F17200 Nicotine dependence, unspecified, uncomplicated: Secondary | ICD-10-CM

## 2011-01-08 DIAGNOSIS — M542 Cervicalgia: Secondary | ICD-10-CM

## 2011-01-08 MED ORDER — METHYLPREDNISOLONE ACETATE PF 40 MG/ML IJ SUSP
40.0000 mg | Freq: Once | INTRAMUSCULAR | Status: AC
  Administered 2011-01-08: 40 mg via INTRAMUSCULAR

## 2011-01-08 MED ORDER — HYDROCODONE-ACETAMINOPHEN 5-500 MG PO TABS: 1.0000 | ORAL_TABLET | Freq: Four times a day (QID) | ORAL | Status: DC | PRN

## 2011-01-08 NOTE — Progress Notes (Signed)
  Subjective:    Patient ID: Allison Jimenez, female    DOB: 1950-04-06, 61 y.o.   MRN: [id number]  HPI  61 year old patient who has a history of low back pain and occasional cervical pain. For the past week she has had increasing left posterior neck pain. No radicular symptoms. No constitutional complaints. She has been evaluated by ENT and presently is on doxycycline for a suspected sinus infection    Review of Systems  Constitutional: Negative.   HENT: Negative for hearing loss, congestion, sore throat, rhinorrhea, dental problem, sinus pressure and tinnitus.   Eyes: Negative for pain, discharge and visual disturbance.  Respiratory: Negative for cough and shortness of breath.   Cardiovascular: Negative for chest pain, palpitations and leg swelling.  Gastrointestinal: Negative for nausea, vomiting, abdominal pain, diarrhea, constipation, blood in stool and abdominal distention.  Genitourinary: Negative for dysuria, urgency, frequency, hematuria, flank pain, vaginal bleeding, vaginal discharge, difficulty urinating, vaginal pain and pelvic pain.  Musculoskeletal: Negative for joint swelling, arthralgias and gait problem.       Neck pain of one week's duration  Skin: Negative for rash.  Neurological: Negative for dizziness, syncope, speech difficulty, weakness, numbness and headaches.  Hematological: Negative for adenopathy.  Psychiatric/Behavioral: Negative for behavioral problems, dysphoric mood and agitation. The patient is not nervous/anxious.        Objective:   Physical Exam  Constitutional: She appears well-developed and well-nourished. No distress.  Neck: Normal range of motion.       Range of motion the neck appear to be intact  No tenderness involving the posterior neck musculature  Musculoskeletal:       Normal grip strength          Assessment & Plan:   Neck pain. The patient is on Mobic 7.5 mg twice daily. We'll fill a new prescription for hydrocodone. We'll  treat with Depo-Medrol 40

## 2011-01-08 NOTE — Patient Instructions (Signed)
You  may move around, but avoid painful motions and activities.  Apply  Heat to the sore area for 15 to 20 minutes 3 or 4 times daily for the next two to 3 days.  Call or return to clinic prn if these symptoms worsen or fail to improve as anticipated.  

## 2011-01-18 ENCOUNTER — Encounter: Payer: Self-pay | Admitting: Gastroenterology

## 2011-02-05 ENCOUNTER — Other Ambulatory Visit: Payer: Self-pay | Admitting: Internal Medicine

## 2011-03-21 ENCOUNTER — Other Ambulatory Visit: Payer: Self-pay | Admitting: Internal Medicine

## 2011-03-25 ENCOUNTER — Other Ambulatory Visit: Payer: Self-pay | Admitting: Internal Medicine

## 2011-04-10 ENCOUNTER — Ambulatory Visit: Payer: Managed Care, Other (non HMO) | Admitting: Internal Medicine

## 2011-04-10 ENCOUNTER — Ambulatory Visit (INDEPENDENT_AMBULATORY_CARE_PROVIDER_SITE_OTHER): Payer: Managed Care, Other (non HMO) | Admitting: Internal Medicine

## 2011-04-10 ENCOUNTER — Encounter: Payer: Self-pay | Admitting: Internal Medicine

## 2011-04-10 VITALS — BP 140/80 | Temp 98.2°F | Wt 133.0 lb

## 2011-04-10 DIAGNOSIS — K529 Noninfective gastroenteritis and colitis, unspecified: Secondary | ICD-10-CM

## 2011-04-10 DIAGNOSIS — E785 Hyperlipidemia, unspecified: Secondary | ICD-10-CM

## 2011-04-10 DIAGNOSIS — K5289 Other specified noninfective gastroenteritis and colitis: Secondary | ICD-10-CM

## 2011-04-10 DIAGNOSIS — F17200 Nicotine dependence, unspecified, uncomplicated: Secondary | ICD-10-CM

## 2011-04-10 DIAGNOSIS — F411 Generalized anxiety disorder: Secondary | ICD-10-CM

## 2011-04-10 MED ORDER — DIPHENOXYLATE-ATROPINE 2.5-0.025 MG PO TABS: 1.0000 | ORAL_TABLET | Freq: Four times a day (QID) | ORAL | Status: AC | PRN

## 2011-04-10 MED ORDER — PROMETHAZINE HCL 25 MG PO TABS: 25.0000 mg | ORAL_TABLET | Freq: Three times a day (TID) | ORAL | Status: DC | PRN

## 2011-04-10 NOTE — Patient Instructions (Signed)
The main concern with gastroenteritis is dehydration.  Drink  plenty of  fluids, and advance your diet  slowly to solids as you feel improved.  If you are unable to keep anything down or  Show  signs of dehydration such as dry, cracked lips, or  not urinating, please notify our office.  HOLD CHLORTHALIDONE  PRN

## 2011-04-10 NOTE — Progress Notes (Signed)
  Subjective:    Patient ID: Allison Jimenez, female    DOB: May 26, 1950, 61 y.o.   MRN: [id number]  HPI  61 year old patient who is in today for followup. She has a history of dyslipidemia and treated hypertension she has a history also of ongoing tobacco use and low back pain. She also has a history of anxiety depression and has been under considerable situational stress her first husband died recently. She is seen today with a one-day history of nausea vomiting and profuse diarrhea. No fever. Denies any abdominal pain other than her intermittent mild right upper quadrant discomfort    Review of Systems  Constitutional: Negative.   HENT: Negative for hearing loss, congestion, sore throat, rhinorrhea, dental problem, sinus pressure and tinnitus.   Eyes: Negative for pain, discharge and visual disturbance.  Respiratory: Negative for cough and shortness of breath.   Cardiovascular: Negative for chest pain, palpitations and leg swelling.  Gastrointestinal: Positive for nausea, abdominal pain and diarrhea. Negative for vomiting, constipation, blood in stool and abdominal distention.  Genitourinary: Negative for dysuria, urgency, frequency, hematuria, flank pain, vaginal bleeding, vaginal discharge, difficulty urinating, vaginal pain and pelvic pain.  Musculoskeletal: Negative for joint swelling, arthralgias and gait problem.  Skin: Negative for rash.  Neurological: Negative for dizziness, syncope, speech difficulty, weakness, numbness and headaches.  Hematological: Negative for adenopathy.  Psychiatric/Behavioral: Negative for behavioral problems, dysphoric mood and agitation. The patient is not nervous/anxious.        Objective:   Physical Exam  Constitutional: She is oriented to person, place, and time. She appears well-developed and well-nourished.  HENT:  Head: Normocephalic.  Right Ear: External ear normal.  Left Ear: External ear normal.  Mouth/Throat: Oropharynx is clear and moist.    Eyes: Conjunctivae and EOM are normal. Pupils are equal, round, and reactive to light.  Neck: Normal range of motion. Neck supple. No thyromegaly present.  Cardiovascular: Normal rate, regular rhythm, normal heart sounds and intact distal pulses.   Pulmonary/Chest: Effort normal and breath sounds normal.  Abdominal: Soft. Bowel sounds are normal. She exhibits no mass. There is no tenderness. There is no rebound and no guarding.       Very mild right upper quadrant discomfort  Musculoskeletal: Normal range of motion.  Lymphadenopathy:    She has no cervical adenopathy.  Neurological: She is alert and oriented to person, place, and time.  Skin: Skin is warm and dry. No rash noted.  Psychiatric: She has a normal mood and affect. Her behavior is normal.          Assessment & Plan:   Viral gastroenteritis. Will treat symptomatically with Lomotil and Phenergan. We'll place on a clear liquids diet and advance as tolerated. Diuretic therapy will be held during the acute illness

## 2011-05-04 ENCOUNTER — Other Ambulatory Visit: Payer: Self-pay

## 2011-05-04 MED ORDER — ALPRAZOLAM 0.25 MG PO TABS: 0.2500 mg | ORAL_TABLET | Freq: Three times a day (TID) | ORAL | Status: DC | PRN

## 2011-06-05 ENCOUNTER — Telehealth: Payer: Self-pay | Admitting: Internal Medicine

## 2011-06-05 ENCOUNTER — Encounter: Payer: Self-pay | Admitting: Internal Medicine

## 2011-06-05 ENCOUNTER — Ambulatory Visit (INDEPENDENT_AMBULATORY_CARE_PROVIDER_SITE_OTHER): Payer: Managed Care, Other (non HMO) | Admitting: Internal Medicine

## 2011-06-05 VITALS — BP 130/80 | Temp 98.0°F | Wt 134.0 lb

## 2011-06-05 DIAGNOSIS — F411 Generalized anxiety disorder: Secondary | ICD-10-CM

## 2011-06-05 MED ORDER — ALPRAZOLAM 0.25 MG PO TABS: 0.2500 mg | ORAL_TABLET | Freq: Three times a day (TID) | ORAL | Status: DC | PRN

## 2011-06-05 MED ORDER — FLUOXETINE HCL 20 MG PO CAPS: ORAL_CAPSULE | ORAL | Status: DC

## 2011-06-05 NOTE — Progress Notes (Signed)
  Subjective:    Patient ID: Allison Jimenez, female    DOB: 03-21-1950, 61 y.o.   MRN: [id number]  HPI  61 year old patient who has a long history of anxiety depression. She has been on Prozac for greater than 20 years she also uses alprazolam when necessary. She's had increasing stress and anxiety and has had several panic attacks over the past 2 days. Yesterday she missed work due to the extreme anxiety. She's had some situational stress at work but also is having some issues with living alone and other stressors. She has been evaluated and treated by psychology and psychiatry in the past. She feels that she might benefit from behavioral health counseling    Review of Systems  Constitutional: Negative.   HENT: Negative for hearing loss, congestion, sore throat, rhinorrhea, dental problem, sinus pressure and tinnitus.   Eyes: Negative for pain, discharge and visual disturbance.  Respiratory: Negative for cough and shortness of breath.   Cardiovascular: Negative for chest pain, palpitations and leg swelling.  Gastrointestinal: Negative for nausea, vomiting, abdominal pain, diarrhea, constipation, blood in stool and abdominal distention.  Genitourinary: Negative for dysuria, urgency, frequency, hematuria, flank pain, vaginal bleeding, vaginal discharge, difficulty urinating, vaginal pain and pelvic pain.  Musculoskeletal: Negative for joint swelling, arthralgias and gait problem.  Skin: Negative for rash.  Neurological: Negative for dizziness, syncope, speech difficulty, weakness, numbness and headaches.  Hematological: Negative for adenopathy.  Psychiatric/Behavioral: Positive for behavioral problems and sleep disturbance. Negative for dysphoric mood and agitation. The patient is nervous/anxious.        Objective:   Physical Exam  Constitutional: She appears well-developed and well-nourished. No distress.       Blood pressure 120/82  Psychiatric: Her behavior is normal. Judgment and thought  content normal.       Anxious          Assessment & Plan:   Anxiety disorder with adjustment disorder  We'll increase Prozac to 40 mg daily. We'll continue when necessary alprazolam Information was given  concerning referral to behavioral health

## 2011-06-05 NOTE — Patient Instructions (Signed)
Behavioral health referral as discussed    It is important that you exercise regularly, at least 20 minutes 3 to 4 times per week.  If you develop chest pain or shortness of breath seek  medical attention.  Minimize caffeine use

## 2011-06-05 NOTE — Telephone Encounter (Signed)
Opened in error

## 2011-06-11 ENCOUNTER — Other Ambulatory Visit: Payer: Self-pay | Admitting: Internal Medicine

## 2011-06-18 ENCOUNTER — Ambulatory Visit: Payer: Managed Care, Other (non HMO) | Admitting: Psychology

## 2011-07-10 ENCOUNTER — Ambulatory Visit: Payer: Managed Care, Other (non HMO) | Admitting: Internal Medicine

## 2011-07-18 ENCOUNTER — Telehealth: Payer: Self-pay | Admitting: Internal Medicine

## 2011-07-18 NOTE — Telephone Encounter (Signed)
Caller: Allison Jimenez/Patient; PCP: Eleonore Chiquito; CB#: [phone]; ; ; Call regarding Medication Question;  Adrijana states that MD advised her to increase her Prozac from 20mg  to 40mg .  Hedi states she is apprehensive about increasing it to 40mg  because of side effects and wants to know if she can increase it to 30mg .  She has 20mg  pills at home and would like a script for 10mg  pills as well.  Please f/u with Darel Hong regarding medication concern.

## 2011-07-19 MED ORDER — FLUOXETINE HCL 20 MG PO CAPS: 20.0000 mg | ORAL_CAPSULE | Freq: Every day | ORAL | Status: DC

## 2011-07-19 NOTE — Telephone Encounter (Signed)
Ok-please call in a new prescription for Prozac 10

## 2011-07-19 NOTE — Telephone Encounter (Signed)
Pt aware, rx sent [facility]

## 2011-07-20 MED ORDER — FLUOXETINE HCL 10 MG PO CAPS: 30.0000 mg | ORAL_CAPSULE | Freq: Every day | ORAL | Status: DC

## 2011-07-20 NOTE — Addendum Note (Signed)
Addended by: Alfred Levins D on: 07/20/2011 01:33 PM   Modules accepted: Orders

## 2011-07-20 NOTE — Telephone Encounter (Signed)
Pt calling back to state the rx that was sent to the pharmacy yesterday was for prozac 20mg  instead of the 10mg .  Pt states she already has 20mg  and they should have not been called in unless Dr. Kirtland Bouchard is wanting her to change the dosage all together.  Please call pt to clarify the correct dosage she should be taking and send that rx to the pharmacy.

## 2011-07-20 NOTE — Telephone Encounter (Signed)
[facility] and [facility] 20 mg.  Dr Kirtland Bouchard what is the sig on the 10 mg?  Pt wants to know if she should take tid or all in the morning?  Also she has plenty of 20 mg left and wants to know if she could cut them in half?

## 2011-07-20 NOTE — Addendum Note (Signed)
Addended by: Alfred Levins D on: 07/20/2011 12:36 PM   Modules accepted: Orders

## 2011-07-23 ENCOUNTER — Telehealth: Payer: Self-pay

## 2011-07-23 MED ORDER — FLUOXETINE HCL 10 MG PO CAPS: 10.0000 mg | ORAL_CAPSULE | Freq: Three times a day (TID) | ORAL | Status: DC

## 2011-07-23 NOTE — Telephone Encounter (Signed)
Pharmacy still didn't have rx? Please call   [facility] - with instructions to dc all other prozac rx's Attempt to call - got VM - LMTCB if still with questions

## 2011-07-24 ENCOUNTER — Other Ambulatory Visit: Payer: Self-pay | Admitting: Internal Medicine

## 2011-08-13 ENCOUNTER — Other Ambulatory Visit: Payer: Self-pay

## 2011-08-13 MED ORDER — ALPRAZOLAM 0.25 MG PO TABS: 0.2500 mg | ORAL_TABLET | Freq: Three times a day (TID) | ORAL | Status: DC | PRN

## 2011-09-13 ENCOUNTER — Other Ambulatory Visit (INDEPENDENT_AMBULATORY_CARE_PROVIDER_SITE_OTHER): Payer: Managed Care, Other (non HMO)

## 2011-09-13 DIAGNOSIS — Z79899 Other long term (current) drug therapy: Secondary | ICD-10-CM

## 2011-09-13 DIAGNOSIS — Z0000 Encounter for general adult medical examination without abnormal findings: Secondary | ICD-10-CM

## 2011-09-13 LAB — HEPATIC FUNCTION PANEL
AST: 21 U/L (ref 0–37)
Alkaline Phosphatase: 46 U/L (ref 39–117)
Total Bilirubin: 0.4 mg/dL (ref 0.3–1.2)

## 2011-09-13 LAB — CBC WITH DIFFERENTIAL/PLATELET
Basophils Absolute: 0.1 10*3/uL (ref 0.0–0.1)
Hemoglobin: 14.1 g/dL (ref 12.0–15.0)
Lymphocytes Relative: 34.6 % (ref 12.0–46.0)
Monocytes Relative: 8.8 % (ref 3.0–12.0)
Neutro Abs: 3.6 10*3/uL (ref 1.4–7.7)
Platelets: 294 10*3/uL (ref 150.0–400.0)
RDW: 13.3 % (ref 11.5–14.6)
WBC: 7.2 10*3/uL (ref 4.5–10.5)

## 2011-09-13 LAB — POCT URINALYSIS DIPSTICK
Bilirubin, UA: NEGATIVE
Blood, UA: NEGATIVE
Glucose, UA: NEGATIVE
Spec Grav, UA: 1.015

## 2011-09-13 LAB — LIPID PANEL
HDL: 44.8 mg/dL (ref >39.00)
Total CHOL/HDL Ratio: 5
Triglycerides: 160 mg/dL — ABNORMAL HIGH (ref 0.0–149.0)
VLDL: 32 mg/dL (ref 0.0–40.0)

## 2011-09-14 LAB — BASIC METABOLIC PANEL
CO2: 25 mEq/L (ref 19–32)
Calcium: 9.8 mg/dL (ref 8.4–10.5)
Creatinine, Ser: 0.9 mg/dL (ref 0.4–1.2)
GFR: 67.89 mL/min (ref >60.00)
Sodium: 138 mEq/L (ref 135–145)

## 2011-09-14 LAB — LDL CHOLESTEROL, DIRECT: Direct LDL: 151.9 mg/dL

## 2011-09-20 ENCOUNTER — Encounter: Payer: Self-pay | Admitting: Internal Medicine

## 2011-09-20 ENCOUNTER — Ambulatory Visit (INDEPENDENT_AMBULATORY_CARE_PROVIDER_SITE_OTHER): Payer: Managed Care, Other (non HMO) | Admitting: Internal Medicine

## 2011-09-20 VITALS — BP 110/70 | HR 82 | Temp 98.1°F | Resp 18 | Ht 61.0 in | Wt 138.0 lb

## 2011-09-20 DIAGNOSIS — Z23 Encounter for immunization: Secondary | ICD-10-CM

## 2011-09-20 DIAGNOSIS — E785 Hyperlipidemia, unspecified: Secondary | ICD-10-CM

## 2011-09-20 DIAGNOSIS — F17200 Nicotine dependence, unspecified, uncomplicated: Secondary | ICD-10-CM

## 2011-09-20 DIAGNOSIS — H8109 Meniere's disease, unspecified ear: Secondary | ICD-10-CM

## 2011-09-20 DIAGNOSIS — F411 Generalized anxiety disorder: Secondary | ICD-10-CM

## 2011-09-20 DIAGNOSIS — Z0000 Encounter for general adult medical examination without abnormal findings: Secondary | ICD-10-CM

## 2011-09-20 MED ORDER — ALPRAZOLAM 0.25 MG PO TABS: 0.2500 mg | ORAL_TABLET | Freq: Three times a day (TID) | ORAL | Status: DC | PRN

## 2011-09-20 MED ORDER — CHLORTHALIDONE 25 MG PO TABS: 25.0000 mg | ORAL_TABLET | Freq: Every day | ORAL | Status: DC

## 2011-09-20 MED ORDER — FLUOXETINE HCL 20 MG PO TABS: 20.0000 mg | ORAL_TABLET | Freq: Every day | ORAL | Status: DC

## 2011-09-20 MED ORDER — POTASSIUM CHLORIDE 20 MEQ/15ML (10%) PO LIQD: 20.0000 meq | Freq: Every day | ORAL | Status: DC

## 2011-09-20 MED ORDER — ATORVASTATIN CALCIUM 10 MG PO TABS: 10.0000 mg | ORAL_TABLET | Freq: Every day | ORAL | Status: DC

## 2011-09-20 MED ORDER — MELOXICAM 7.5 MG PO TABS: 7.5000 mg | ORAL_TABLET | Freq: Every day | ORAL | Status: DC

## 2011-09-20 NOTE — Patient Instructions (Signed)
Smoking tobacco is very bad for your health. You should stop smoking immediately.  Take a calcium supplement, plus [phone] units of vitamin D    It is important that you exercise regularly, at least 20 minutes 3 to 4 times per week.  If you develop chest pain or shortness of breath seek  medical attention.

## 2011-09-20 NOTE — Progress Notes (Signed)
Patient ID: Allison Jimenez, female   DOB: [birth year], 61 y.o.   MRN: [id number]  Subjective:    Patient ID: Allison Jimenez, female    DOB: 03-19-1950, 61 y.o.   MRN: [id number]  HPI  61 year-old patient who is seen today for an annual exam. She has a history of tobacco use but has dramatically decreased her tobacco consumption. She was seen one week by GYN and did have a mammogram as well. She does get annual eye exams. She has a history of anxiety disorder which has been stable. She is on Lipitor for mild dyslipidemia. Laboratory studies reviewed. No new concerns or complaints.  Cholesterol was elevated the patient has not been taking Lipitor recently. She is on diuretic therapy for Mnire's disease. She has been followed by ENT.   Allergies:  1) ! Penicillin V Potassium (Penicillin V Potassium)  2) ! Erythromycin Base (Erythromycin Base)   Past History:  Past Medical History:  Reviewed history from 11/25/2007 and no changes required.  Anxiety  Diverticulosis, colon  Dizziness or vertigo  Hyperlipidemia  Mnire's disease  tobacco abuse  impaired glucose tolerance   Past Surgical History:  Reviewed history from 11/25/2007 and no changes required.  Appendectomy 1996  Caesarean section gravida one, para one, abortus one  Cholecystectomy laparoscopic 1997  Tubal ligation  Mastoid surgery  status post laparoscopy for ovarian cyst  surgery for vocal cord nodule  status post carpal tunnel release  colonoscopy January 2006   Family History:  Reviewed history from 11/25/2007 and no changes required.  mother died age 82, with a history of bilateral breast cancer and cerebrovascular disease  (10/12) Father age 110  One sister in good health  A number of aunts and uncles with the diabetes   Social History:  Reviewed history from 11/25/2007 and no changes required.  Divorced  one adult son  Cares for her elderly father (Mr Georgia Dom) with dementia    Review of Systems  Constitutional:  Negative for fever, appetite change, fatigue and unexpected weight change.  HENT: Negative for hearing loss, ear pain, nosebleeds, congestion, sore throat, mouth sores, trouble swallowing, neck stiffness, dental problem, voice change, sinus pressure and tinnitus.   Eyes: Negative for photophobia, pain, redness and visual disturbance.  Respiratory: Positive for cough. Negative for chest tightness and shortness of breath.   Cardiovascular: Negative for chest pain, palpitations and leg swelling.  Gastrointestinal: Negative for nausea, vomiting, abdominal pain, diarrhea, constipation, blood in stool, abdominal distention and rectal pain.  Genitourinary: Negative for dysuria, urgency, frequency, hematuria, flank pain, vaginal bleeding, vaginal discharge, difficulty urinating, genital sores, vaginal pain, menstrual problem and pelvic pain.  Musculoskeletal: Negative for back pain and arthralgias.  Skin: Negative for rash.  Neurological: Negative for dizziness, syncope, speech difficulty, weakness, light-headedness, numbness and headaches.  Hematological: Negative for adenopathy. Does not bruise/bleed easily.  Psychiatric/Behavioral: Negative for suicidal ideas, behavioral problems, self-injury, dysphoric mood and agitation. The patient is not nervous/anxious.        Objective:   Physical Exam  Constitutional: She is oriented to person, place, and time. She appears well-developed and well-nourished.  HENT:  Head: Normocephalic and atraumatic.  Right Ear: External ear normal.  Left Ear: External ear normal.  Mouth/Throat: Oropharynx is clear and moist.  Eyes: Conjunctivae normal and EOM are normal.  Neck: Normal range of motion. Neck supple. No JVD present. No thyromegaly present.  Cardiovascular: Normal rate, regular rhythm, normal heart sounds and intact distal pulses.   No murmur  heard. Pulmonary/Chest: Effort normal. She has no wheezes. She has no rales.       Bilateral faint coarse rhonchi    Abdominal: Soft. Bowel sounds are normal. She exhibits no distension and no mass. There is no tenderness. There is no rebound and no guarding.  Musculoskeletal: Normal range of motion. She exhibits no edema and no tenderness.  Neurological: She is alert and oriented to person, place, and time. She has normal reflexes. No cranial nerve deficit. She exhibits normal muscle tone. Coordination normal.  Skin: Skin is warm and dry. No rash noted.  Psychiatric: She has a normal mood and affect. Her behavior is normal.          Assessment & Plan:    Annual health examination  Tobacco use. improved Dyslipidemia. Will continue Lipitor 10 mg daily  Total tobacco use encouraged. Regular  exercise encouraged

## 2011-10-13 ENCOUNTER — Other Ambulatory Visit: Payer: Self-pay | Admitting: Internal Medicine

## 2011-10-16 ENCOUNTER — Other Ambulatory Visit: Payer: Self-pay | Admitting: Internal Medicine

## 2011-10-17 ENCOUNTER — Other Ambulatory Visit: Payer: Self-pay | Admitting: Internal Medicine

## 2011-11-10 ENCOUNTER — Other Ambulatory Visit: Payer: Self-pay | Admitting: Internal Medicine

## 2011-11-12 NOTE — Telephone Encounter (Signed)
Med filled.  

## 2012-01-11 ENCOUNTER — Telehealth: Payer: Self-pay | Admitting: Internal Medicine

## 2012-01-11 MED ORDER — FLUOXETINE HCL 20 MG PO CAPS: 20.0000 mg | ORAL_CAPSULE | Freq: Every day | ORAL | Status: DC

## 2012-01-11 NOTE — Telephone Encounter (Signed)
Pt needs refill of FLUoxetine (PROZAC) 20 MG capsule. Pls make sure this is capsule. Pt said this is 3rd request, but we have no record. I told pt we would try to do today, because pt is out of meds.

## 2012-01-11 NOTE — Telephone Encounter (Signed)
Called pt told her Rx for Fluoxetine 20 mg capsules was called into pharmacy.

## 2012-01-31 ENCOUNTER — Other Ambulatory Visit: Payer: Self-pay | Admitting: Internal Medicine

## 2012-02-05 NOTE — Telephone Encounter (Signed)
Pt is out of med

## 2012-02-06 NOTE — Telephone Encounter (Signed)
Pt is following up on refill request.  Ph[facility]: [facility] [address], [place]  [phone]  Pt needs today please.

## 2012-02-18 ENCOUNTER — Other Ambulatory Visit: Payer: Self-pay | Admitting: Internal Medicine

## 2012-03-27 ENCOUNTER — Other Ambulatory Visit: Payer: Self-pay | Admitting: Internal Medicine

## 2012-03-28 ENCOUNTER — Telehealth: Payer: Self-pay | Admitting: Internal Medicine

## 2012-03-28 MED ORDER — ALPRAZOLAM 0.25 MG PO TABS: ORAL_TABLET | ORAL | Status: DC

## 2012-03-28 NOTE — Telephone Encounter (Signed)
Pt calling to inquire about her ALPRAZolam (XANAX) 0.25 MG tablet refill. Please assist.

## 2012-03-28 NOTE — Telephone Encounter (Signed)
Pt notified Rx refill for Xanax was called into pharmacy.

## 2012-04-16 ENCOUNTER — Other Ambulatory Visit: Payer: Self-pay | Admitting: Internal Medicine

## 2012-04-17 ENCOUNTER — Telehealth: Payer: Self-pay | Admitting: Internal Medicine

## 2012-04-17 MED ORDER — ALPRAZOLAM 0.25 MG PO TABS: ORAL_TABLET | ORAL | Status: DC

## 2012-04-17 NOTE — Telephone Encounter (Signed)
Spoke to pt told her I called Alprazolam into the pharmacy for her, I increased the amount did not realize you were taking it three times a day. Pt verbalized understanding and stated yes she does take it three times a day. Told her okay.

## 2012-04-17 NOTE — Telephone Encounter (Signed)
Pt called to find out why her ALPRAZolam (XANAX) 0.25 MG tablet was refused. She said it has been 20 days and 60 pills at 3 x/day is only 20 days. Pls advise.

## 2012-05-13 ENCOUNTER — Other Ambulatory Visit: Payer: Self-pay | Admitting: Internal Medicine

## 2012-06-17 ENCOUNTER — Other Ambulatory Visit: Payer: Self-pay | Admitting: Internal Medicine

## 2012-06-27 ENCOUNTER — Other Ambulatory Visit: Payer: Self-pay | Admitting: Internal Medicine

## 2012-07-09 ENCOUNTER — Ambulatory Visit (INDEPENDENT_AMBULATORY_CARE_PROVIDER_SITE_OTHER): Payer: Managed Care, Other (non HMO) | Admitting: Internal Medicine

## 2012-07-09 ENCOUNTER — Encounter: Payer: Self-pay | Admitting: Internal Medicine

## 2012-07-09 VITALS — BP 108/70 | HR 77 | Temp 98.4°F | Resp 18 | Wt 135.0 lb

## 2012-07-09 DIAGNOSIS — F17200 Nicotine dependence, unspecified, uncomplicated: Secondary | ICD-10-CM

## 2012-07-09 DIAGNOSIS — R5383 Other fatigue: Secondary | ICD-10-CM

## 2012-07-09 DIAGNOSIS — F411 Generalized anxiety disorder: Secondary | ICD-10-CM

## 2012-07-09 DIAGNOSIS — R5381 Other malaise: Secondary | ICD-10-CM

## 2012-07-09 DIAGNOSIS — R42 Dizziness and giddiness: Secondary | ICD-10-CM

## 2012-07-09 DIAGNOSIS — E785 Hyperlipidemia, unspecified: Secondary | ICD-10-CM

## 2012-07-09 DIAGNOSIS — H8109 Meniere's disease, unspecified ear: Secondary | ICD-10-CM

## 2012-07-09 LAB — CBC WITH DIFFERENTIAL/PLATELET
Basophils Relative: 0.7 % (ref 0.0–3.0)
Eosinophils Relative: 2.7 % (ref 0.0–5.0)
Hemoglobin: 14.8 g/dL (ref 12.0–15.0)
Lymphs Abs: 3.1 10*3/uL (ref 0.7–4.0)
MCHC: 34 g/dL (ref 30.0–36.0)
MCV: 88 fl (ref 78.0–100.0)
Monocytes Relative: 8.3 % (ref 3.0–12.0)
RDW: 13.1 % (ref 11.5–14.6)

## 2012-07-09 LAB — COMPREHENSIVE METABOLIC PANEL
ALT: 20 U/L (ref 0–35)
AST: 21 U/L (ref 0–37)
Albumin: 4.5 g/dL (ref 3.5–5.2)
Alkaline Phosphatase: 47 U/L (ref 39–117)
Calcium: 10.3 mg/dL (ref 8.4–10.5)
Chloride: 98 mEq/L (ref 96–112)
Potassium: 4.5 mEq/L (ref 3.5–5.1)

## 2012-07-09 LAB — GLUCOSE, POCT (MANUAL RESULT ENTRY): POC Glucose: 87 mg/dl (ref 70–99)

## 2012-07-09 LAB — TSH: TSH: 3.04 u[IU]/mL (ref 0.35–5.50)

## 2012-07-09 MED ORDER — FLUTICASONE PROPIONATE 50 MCG/ACT NA SUSP: 2.0000 | Freq: Every day | NASAL | Status: DC

## 2012-07-09 NOTE — Patient Instructions (Addendum)
Smoking tobacco is very bad for your health. You should stop smoking immediately.  Call or return to clinic prn if these symptoms worsen or fail to improve as anticipated. Insomnia Insomnia is frequent trouble falling and/or staying asleep. Insomnia can be a long term problem or a short term problem. Both are common. Insomnia can be a short term problem when the wakefulness is related to a certain stress or worry. Long term insomnia is often related to ongoing stress during waking hours and/or poor sleeping habits. Overtime, sleep deprivation itself can make the problem worse. Every little thing feels more severe because you are overtired and your ability to cope is decreased. CAUSES   Stress, anxiety, and depression.  Poor sleeping habits.  Distractions such as TV in the bedroom.  Naps close to bedtime.  Engaging in emotionally charged conversations before bed.  Technical reading before sleep.  Alcohol and other sedatives. They may make the problem worse. They can hurt normal sleep patterns and normal dream activity.  Stimulants such as caffeine for several hours prior to bedtime.  Pain syndromes and shortness of breath can cause insomnia.  Exercise late at night.  Changing time zones may cause sleeping problems (jet lag). It is sometimes helpful to have someone observe your sleeping patterns. They should look for periods of not breathing during the night (sleep apnea). They should also look to see how long those periods last. If you live alone or observers are uncertain, you can also be observed at a sleep clinic where your sleep patterns will be professionally monitored. Sleep apnea requires a checkup and treatment. Give your caregivers your medical history. Give your caregivers observations your family has made about your sleep.  SYMPTOMS   Not feeling rested in the morning.  Anxiety and restlessness at bedtime.  Difficulty falling and staying asleep. TREATMENT   Your  caregiver may prescribe treatment for an underlying medical disorders. Your caregiver can give advice or help if you are using alcohol or other drugs for self-medication. Treatment of underlying problems will usually eliminate insomnia problems.  Medications can be prescribed for short time use. They are generally not recommended for lengthy use.  Over-the-counter sleep medicines are not recommended for lengthy use. They can be habit forming.  You can promote easier sleeping by making lifestyle changes such as:  Using relaxation techniques that help with breathing and reduce muscle tension.  Exercising earlier in the day.  Changing your diet and the time of your last meal. No night time snacks.  Establish a regular time to go to bed.  Counseling can help with stressful problems and worry.  Soothing music and white noise may be helpful if there are background noises you cannot remove.  Stop tedious detailed work at least one hour before bedtime. HOME CARE INSTRUCTIONS   Keep a diary. Inform your caregiver about your progress. This includes any medication side effects. See your caregiver regularly. Take note of:  Times when you are asleep.  Times when you are awake during the night.  The quality of your sleep.  How you feel the next day. This information will help your caregiver care for you.  Get out of bed if you are still awake after 15 minutes. Read or do some quiet activity. Keep the lights down. Wait until you feel sleepy and go back to bed.  Keep regular sleeping and waking hours. Avoid naps.  Exercise regularly.  Avoid distractions at bedtime. Distractions include watching television or engaging in any intense  or detailed activity like attempting to balance the household checkbook.  Develop a bedtime ritual. Keep a familiar routine of bathing, brushing your teeth, climbing into bed at the same time each night, listening to soothing music. Routines increase the success  of falling to sleep faster.  Use relaxation techniques. This can be using breathing and muscle tension release routines. It can also include visualizing peaceful scenes. You can also help control troubling or intruding thoughts by keeping your mind occupied with boring or repetitive thoughts like the old concept of counting sheep. You can make it more creative like imagining planting one beautiful flower after another in your backyard garden.  During your day, work to eliminate stress. When this is not possible use some of the previous suggestions to help reduce the anxiety that accompanies stressful situations. MAKE SURE YOU:   Understand these instructions.  Will watch your condition.  Will get help right away if you are not doing well or get worse. Document Released: 12/16/1999 Document Revised: 03/12/2011 Document Reviewed: 01/15/2007 [facility] Patient Information 2014 [place], [place].

## 2012-07-09 NOTE — Progress Notes (Signed)
Subjective:    Patient ID: Allison Jimenez, female    DOB: July 12, 1950, 62 y.o.   MRN: [id number]  HPI  62 year old patient who is seen today for followup. She has a history of anxiety depression and complains of increasing fatigue and insomnia. She's also been followed by ENT do to Mnire's disease which has been bothersome this week. She has been treated with chlorthalidone. She also describes some increasing sinus congestion and drainage. She continues to smoke but has cut back on her tobacco use. No fever or purulent drainage. Past Medical History  Diagnosis Date  . ANXIETY 06/05/2006  . BACK PAIN 01/05/2010  . DIVERTICULOSIS, COLON 06/05/2006  . DIZZINESS OR VERTIGO 06/05/2006  . HYPERLIPIDEMIA 06/05/2006  . HYPOKALEMIA 12/14/2008  . MENIERE'S DISEASE 06/25/2006  . TOBACCO USER 11/25/2007    History   Social History  . Marital Status: Married    Spouse Name: N/A    Number of Children: N/A  . Years of Education: N/A   Occupational History  . Not on file.   Social History Main Topics  . Smoking status: Current Some Day Smoker  . Smokeless tobacco: Never Used     Comment: using electronic cigs.  . Alcohol Use: No  . Drug Use: No  . Sexually Active: Not on file   Other Topics Concern  . Not on file   Social History Narrative  . No narrative on file    Past Surgical History  Procedure Laterality Date  . Appendectomy    . Cholecystectomy    . Cosmetic surgery    . Tubal ligation    . Carpal tunnel release    . Vocal cord surgery      nodule  . Mastoid surgery      No family history on file.  Allergies  Allergen Reactions  . Erythromycin Base   . Penicillins     Current Outpatient Prescriptions on File Prior to Visit  Medication Sig Dispense Refill  . ALPRAZolam (XANAX) 0.25 MG tablet TAKE 1 TABLET 3 TIMES A DAY AS NEEDED  90 tablet  1  . atorvastatin (LIPITOR) 10 MG tablet Take 1 tablet (10 mg total) by mouth daily.  90 tablet  6  . chlorthalidone (HYGROTON) 25  MG tablet Take 1 tablet (25 mg total) by mouth daily.  90 tablet  3  . FLUoxetine (PROZAC) 20 MG tablet Take 1 tablet (20 mg total) by mouth daily.  90 tablet  3  . meloxicam (MOBIC) 7.5 MG tablet Take 1 tablet (7.5 mg total) by mouth daily.  90 tablet  5  . potassium chloride 20 MEQ/15ML (10%) solution Take 15 mLs (20 mEq total) by mouth daily.  300 mL  6   No current facility-administered medications on file prior to visit.    BP 108/70  Pulse 77  Temp(Src) 98.4 F (36.9 C) (Oral)  Resp 18  Wt 135 lb (61.236 kg)  BMI 25.52 kg/m2  SpO2 98%       Review of Systems  Constitutional: Positive for fatigue.  HENT: Negative for hearing loss, congestion, sore throat, rhinorrhea, dental problem, sinus pressure and tinnitus.   Eyes: Negative for pain, discharge and visual disturbance.  Respiratory: Negative for cough and shortness of breath.   Cardiovascular: Negative for chest pain, palpitations and leg swelling.  Gastrointestinal: Negative for nausea, vomiting, abdominal pain, diarrhea, constipation, blood in stool and abdominal distention.  Genitourinary: Negative for dysuria, urgency, frequency, hematuria, flank pain, vaginal bleeding, vaginal discharge, difficulty  urinating, vaginal pain and pelvic pain.  Musculoskeletal: Negative for joint swelling, arthralgias and gait problem.  Skin: Negative for rash.  Neurological: Positive for dizziness, weakness and light-headedness. Negative for syncope, speech difficulty, numbness and headaches.  Hematological: Negative for adenopathy.  Psychiatric/Behavioral: Positive for dysphoric mood. Negative for behavioral problems and agitation. The patient is nervous/anxious.        Objective:   Physical Exam  Constitutional: She is oriented to person, place, and time. She appears well-developed and well-nourished.  HENT:  Head: Normocephalic.  Right Ear: External ear normal.  Left Ear: External ear normal.  Mouth/Throat: Oropharynx is clear  and moist.  Eyes: Conjunctivae and EOM are normal. Pupils are equal, round, and reactive to light.  Neck: Normal range of motion. Neck supple. No thyromegaly present.  Cardiovascular: Normal rate, regular rhythm, normal heart sounds and intact distal pulses.   Pulmonary/Chest: Effort normal and breath sounds normal.  Abdominal: Soft. Bowel sounds are normal. She exhibits no mass. There is no tenderness.  Musculoskeletal: Normal range of motion.  Lymphadenopathy:    She has no cervical adenopathy.  Neurological: She is alert and oriented to person, place, and time.  Skin: Skin is warm and dry. No rash noted.  Psychiatric: She has a normal mood and affect. Her behavior is normal.          Assessment & Plan:   Anxiety depression. Referral for counseling discussed. She will consider Dyslipidemia continue atorvastatin Ongoing tobacco use total smoking cessation encouraged Mnire's disease  We'll check laboratory update due to her chronic fatigue at her request. Has been almost one year since laboratory studies have been performed

## 2012-07-10 ENCOUNTER — Encounter: Payer: Self-pay | Admitting: Internal Medicine

## 2012-07-11 ENCOUNTER — Telehealth: Payer: Self-pay | Admitting: Internal Medicine

## 2012-07-11 NOTE — Telephone Encounter (Signed)
Pt is requesting blood work results. Pt is aware MD out of office

## 2012-07-14 NOTE — Telephone Encounter (Signed)
Please call/notify patient that lab/test/procedure is normal 

## 2012-07-14 NOTE — Telephone Encounter (Signed)
Please advise 

## 2012-07-15 NOTE — Telephone Encounter (Signed)
Left message on voicemail to call office.  

## 2012-07-16 NOTE — Telephone Encounter (Signed)
Left detailed message on personal voicemail lab results were normal per Dr. Amador Cunas.

## 2012-08-27 ENCOUNTER — Other Ambulatory Visit: Payer: Self-pay | Admitting: Internal Medicine

## 2012-10-09 ENCOUNTER — Encounter: Payer: Self-pay | Admitting: Neurology

## 2012-10-09 ENCOUNTER — Ambulatory Visit (INDEPENDENT_AMBULATORY_CARE_PROVIDER_SITE_OTHER): Payer: Managed Care, Other (non HMO) | Admitting: Neurology

## 2012-10-09 VITALS — BP 125/69 | HR 58 | Ht 62.0 in | Wt 131.0 lb

## 2012-10-09 DIAGNOSIS — H8109 Meniere's disease, unspecified ear: Secondary | ICD-10-CM

## 2012-10-09 DIAGNOSIS — E785 Hyperlipidemia, unspecified: Secondary | ICD-10-CM

## 2012-10-09 DIAGNOSIS — F411 Generalized anxiety disorder: Secondary | ICD-10-CM

## 2012-10-09 DIAGNOSIS — G43909 Migraine, unspecified, not intractable, without status migrainosus: Secondary | ICD-10-CM

## 2012-10-09 MED ORDER — RIZATRIPTAN BENZOATE 5 MG PO TBDP: 5.0000 mg | ORAL_TABLET | ORAL | Status: DC | PRN

## 2012-10-09 MED ORDER — TOPIRAMATE ER 100 MG PO CAP24: 100.0000 mg | ORAL_CAPSULE | Freq: Every day | ORAL | Status: DC

## 2012-10-09 NOTE — Progress Notes (Signed)
[facility]  PATIENT: Allison Jimenez DOB: [birth year]  HISTORICAL  Tove is a 62 years old right-handed Caucasian female, referred by  ENT Dr. Haroldine Laws, also primary care physician Dr. Amador Cunas for evaluation of abnormal MRI scan, headaches, dizziness spell  She had past medical history of anxiety depression, hyperlipidemia, long-standing history of Mnire's disease, also reported a history of migraine headaches all her life  Over the years, she had recurrent episode of dizziness, constant ringing in her left ear, she also have episodes of vertex area pounding headache with associated light headedness, unbalanced gait, nausea  vomiting, light sensitivity, lasting for 3 days, she had those episodes 2-3 times each year,  She has been treated with diuretic hydrochlorothiazide 25 mg, along with potassium, in 1992, with diagnosis of Mnire disease, frequent dizziness spells with headaches, she underwent left mastoidectomy, which only improve her symptoms temporarily,  Recent hearing test showed severe left-sided hearing loss, hearing threshold is at 70   She denies visual loss, denied lateralized motor or sensory deficit, but has mild dizziness with sudden positional changes, she also has mild daily headaches, has been taking aspirin 6 tablets each day for many years  REVIEW OF SYSTEMS: Full 14 system review of systems performed and notable only for  fatigue, hearing loss, spinning sensation, headaches, dizziness, insomnia, depression, anxiety, decreased energy,  ALLERGIES: Allergies  Allergen Reactions  . Erythromycin Base   . Penicillins     HOME MEDICATIONS: Outpatient Prescriptions Prior to Visit  Medication Sig Dispense Refill  . ALPRAZolam (XANAX) 0.25 MG tablet TAKE 1 TABLET 3 TIMES A DAY AS NEEDED  90 tablet  2  . atorvastatin (LIPITOR) 10 MG tablet Take 1 tablet (10 mg total) by mouth daily.  90 tablet  6  . chlorthalidone (HYGROTON) 25 MG tablet Take 1  tablet (25 mg total) by mouth daily.  90 tablet  3  . FLUoxetine (PROZAC) 20 MG tablet Take 1 tablet (20 mg total) by mouth daily.  90 tablet  3  . fluticasone (FLONASE) 50 MCG/ACT nasal spray Place 2 sprays into the nose daily.  16 g  6  . meloxicam (MOBIC) 7.5 MG tablet Take 1 tablet (7.5 mg total) by mouth daily.  90 tablet  5  . PATADAY 0.2 % SOLN       . potassium chloride 20 MEQ/15ML (10%) solution Take 15 mLs (20 mEq total) by mouth daily.  300 mL  6   No facility-administered medications prior to visit.    PAST MEDICAL HISTORY: Past Medical History  Diagnosis Date  . ANXIETY 06/05/2006  . BACK PAIN 01/05/2010  . DIVERTICULOSIS, COLON 06/05/2006  . DIZZINESS OR VERTIGO 06/05/2006  . HYPERLIPIDEMIA 06/05/2006  . HYPOKALEMIA 12/14/2008  . MENIERE'S DISEASE 06/25/2006  . TOBACCO USER 11/25/2007    PAST SURGICAL HISTORY: Past Surgical History  Procedure Laterality Date  . Appendectomy    . Cholecystectomy    . Cosmetic surgery    . Tubal ligation    . Carpal tunnel release    . Vocal cord surgery      nodule  . Mastoid surgery      FAMILY HISTORY: Family History  Problem Relation Age of Onset  . Migraines    . Depression    . Anxiety disorder    . High Cholesterol      SOCIAL HISTORY:  History   Social History  . Marital Status: Married    Spouse Name: N/A    Number of  Children: 1  . Years of Education: 13   Occupational History  .      Retired   Social History Main Topics  . Smoking status: Current Some Day Smoker  . Smokeless tobacco: Never Used     Comment: using electronic cigs.  . Alcohol Use: No  . Drug Use: No  . Sexual Activity: Not on file   Other Topics Concern  . Not on file   Social History Narrative   Patient lives at home alone. Patient has a Biochemist, clinical.      PHYSICAL EXAM   Filed Vitals:   10/09/12 1522  BP: 125/69  Pulse: 58  Height: 5\' 2"  (1.575 m)  Weight: 131 lb (59.421 kg)     Body mass index is 23.95  kg/(m^2).   Generalized: In no acute distress  Neck: Supple, no carotid bruits   Cardiac: Regular rate rhythm  Pulmonary: Clear to auscultation bilaterally  Musculoskeletal: No deformity  Neurological examination  Mentation: Alert oriented to time, place, history taking, and causual conversation  Cranial nerve II-XII: Pupils were equal round reactive to light extraocular movements were full, visual field were full on confrontational test. facial sensation and strength were normal. Hard of hearing bilaterally, left side bony conduction more than air conduction, Uvula tongue midline.  head turning and shoulder shrug and were normal and symmetric.Tongue protrusion into cheek strength was normal.  Motor: normal tone, bulk and strength.  Sensory: Intact to fine touch, pinprick, preserved vibratory sensation, and proprioception at toes.  Coordination: Normal finger to nose, heel-to-shin bilaterally there was no truncal ataxia  Gait: Rising up from seated position without assistance, normal stance, without trunk ataxia, moderate stride, good arm swing, smooth turning, able to perform tiptoe, and heel walking without difficulty.   Romberg signs: Negative  Deep tendon reflexes: Brachioradialis 2/2, biceps 2/2, triceps 2/2, patellar 2/2, Achilles 2/2, plantar responses were flexor bilaterally.   DIAGNOSTIC DATA (LABS, IMAGING, TESTING) - I reviewed patient records, labs, notes, testing and imaging myself where available.  Lab Results  Component Value Date   WBC 8.8 07/09/2012   HGB 14.8 07/09/2012   HCT 43.3 07/09/2012   MCV 88.0 07/09/2012   PLT 350.0 07/09/2012      Component Value Date/Time   NA 139 07/09/2012 1203   K 4.5 07/09/2012 1203   CL 98 07/09/2012 1203   CO2 27 07/09/2012 1203   GLUCOSE 93 07/09/2012 1203   BUN 16 07/09/2012 1203   CREATININE 0.8 07/09/2012 1203   CALCIUM 10.3 07/09/2012 1203   PROT 7.6 07/09/2012 1203   ALBUMIN 4.5 07/09/2012 1203   AST 21 07/09/2012 1203   ALT 20  07/09/2012 1203   ALKPHOS 47 07/09/2012 1203   BILITOT 0.4 07/09/2012 1203   GFRNONAA 91.60 12/14/2008 1600   GFRAA 95 11/18/2007 0814   Lab Results  Component Value Date   CHOL 235* 09/13/2011   HDL 44.80 09/13/2011   LDLCALC 76 05/11/2010   LDLDIRECT 151.9 09/13/2011   TRIG 160.0* 09/13/2011   CHOLHDL 5 09/13/2011   Lab Results  Component Value Date   HGBA1C 6.5* 06/18/2006   No results found for this basename: [id number]   Lab Results  Component Value Date   TSH 3.04 07/09/2012      ASSESSMENT AND PLAN   62 years old Caucasian female with history of migraine headaches, reported history of Mnire's disease, presenting with frequent dizziness spells, associated headache with migraine features, decreased hearing at left side, MRI  of the brain has demonstrated small vessel disease,    1. add on preventive medication Trokendi xr 100 mg every day 2.  Maxalt as needed for severe migraine 3  return to clinic in 3 months.    4 stop asa 6 tabs each day, only taken 1 tablet every day .      Levert Feinstein, M.D. Ph.D.  [facility] [address] [place], [place] [address] [phone]

## 2012-10-29 ENCOUNTER — Encounter: Payer: Self-pay | Admitting: Internal Medicine

## 2012-10-29 ENCOUNTER — Ambulatory Visit (INDEPENDENT_AMBULATORY_CARE_PROVIDER_SITE_OTHER): Payer: Managed Care, Other (non HMO) | Admitting: Internal Medicine

## 2012-10-29 VITALS — BP 110/66 | HR 83 | Temp 98.4°F | Resp 20 | Wt 132.0 lb

## 2012-10-29 DIAGNOSIS — F17200 Nicotine dependence, unspecified, uncomplicated: Secondary | ICD-10-CM

## 2012-10-29 DIAGNOSIS — G43909 Migraine, unspecified, not intractable, without status migrainosus: Secondary | ICD-10-CM

## 2012-10-29 DIAGNOSIS — L659 Nonscarring hair loss, unspecified: Secondary | ICD-10-CM

## 2012-10-29 DIAGNOSIS — F411 Generalized anxiety disorder: Secondary | ICD-10-CM

## 2012-10-29 MED ORDER — TOPIRAMATE 50 MG PO TABS: 50.0000 mg | ORAL_TABLET | Freq: Two times a day (BID) | ORAL | Status: DC

## 2012-10-29 MED ORDER — FLUOXETINE HCL 20 MG PO TABS: 20.0000 mg | ORAL_TABLET | Freq: Every day | ORAL | Status: DC

## 2012-10-29 MED ORDER — FLUOXETINE HCL 10 MG PO TABS: 10.0000 mg | ORAL_TABLET | Freq: Every day | ORAL | Status: DC

## 2012-10-29 NOTE — Patient Instructions (Signed)
Multivitamin daily    It is important that you exercise regularly, at least 20 minutes 3 to 4 times per week.  If you develop chest pain or shortness of breath seek  medical attention.

## 2012-10-30 ENCOUNTER — Encounter: Payer: Self-pay | Admitting: Internal Medicine

## 2012-10-30 NOTE — Progress Notes (Signed)
Subjective:    Patient ID: Allison Jimenez, female    DOB: 1950-01-16, 62 y.o.   MRN: [id number]  HPI   62 year old patient who is seen today with a chief complaint of hair loss. She describes some thinning in the frontal scalp area. She does feel that this has stabilized. Since her last visit here she has been placed on Topamax for migraine prophylaxis. Otherwise no new medications. She does have a history of anxiety and has requested an increase in her Prozac. Last month due to severe headaches and vertigo a Bran MRI was performed. 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  Past Medical History  Diagnosis Date  . ANXIETY 06/05/2006  . BACK PAIN 01/05/2010  . DIVERTICULOSIS, COLON 06/05/2006  . DIZZINESS OR VERTIGO 06/05/2006  . HYPERLIPIDEMIA 06/05/2006  . HYPOKALEMIA 12/14/2008  . MENIERE'S DISEASE 06/25/2006  . TOBACCO USER 11/25/2007    History   Social History  . Marital Status: Married    Spouse Name: N/A    Number of Children: 1  . Years of Education: 13   Occupational History  .      Retired   Social History Main Topics  . Smoking status: Current Some Day Smoker  . Smokeless tobacco: Never Used     Comment: using electronic cigs.  . Alcohol Use: No  . Drug Use: No  . Sexual Activity: Not on file   Other Topics Concern  . Not on file   Social History Narrative   Patient lives at home alone. Patient has a Biochemist, clinical.     Past Surgical History  Procedure Laterality Date  . Appendectomy    . Cholecystectomy    . Cosmetic surgery    . Tubal ligation    . Carpal tunnel release    . Vocal cord surgery      nodule  . Mastoid surgery      Family History  Problem Relation Age of Onset  . Migraines    . Depression    . Anxiety disorder    . High Cholesterol      Allergies  Allergen Reactions  . Erythromycin Base   . Penicillins     Current Outpatient Prescriptions on File Prior to Visit   Medication Sig Dispense Refill  . ALPRAZolam (XANAX) 0.25 MG tablet TAKE 1 TABLET 3 TIMES A DAY AS NEEDED  90 tablet  2  . atorvastatin (LIPITOR) 10 MG tablet Take 1 tablet (10 mg total) by mouth daily.  90 tablet  6  . chlorthalidone (HYGROTON) 25 MG tablet Take 1 tablet (25 mg total) by mouth daily.  90 tablet  3  . PATADAY 0.2 % SOLN       . potassium chloride 20 MEQ/15ML (10%) solution Take 15 mLs (20 mEq total) by mouth daily.  300 mL  6  . rizatriptan (MAXALT-MLT) 5 MG disintegrating tablet Take 1 tablet (5 mg total) by mouth as needed for migraine. May repeat in 2 hours if needed  15 tablet  6   No current facility-administered medications on file prior to visit.    BP 110/66  Pulse 83  Temp(Src) 98.4 F (36.9 C) (Oral)  Resp 20  Wt 132 lb (59.875 kg)  BMI 24.14 kg/m2  SpO2 96%       Review of Systems  Constitutional: Negative.   HENT: Negative for congestion, dental problem, hearing loss, rhinorrhea,  sinus pressure, sore throat and tinnitus.   Eyes: Negative for pain, discharge and visual disturbance.  Respiratory: Negative for cough and shortness of breath.   Cardiovascular: Negative for chest pain, palpitations and leg swelling.  Gastrointestinal: Negative for nausea, vomiting, abdominal pain, diarrhea, constipation, blood in stool and abdominal distention.  Genitourinary: Negative for dysuria, urgency, frequency, hematuria, flank pain, vaginal bleeding, vaginal discharge, difficulty urinating, vaginal pain and pelvic pain.  Musculoskeletal: Negative for arthralgias, gait problem and joint swelling.  Skin: Negative for rash.        Hair loss   Neurological: Negative for dizziness, syncope, speech difficulty, weakness, numbness and headaches.  Hematological: Negative for adenopathy.  Psychiatric/Behavioral: Negative for behavioral problems, dysphoric mood and agitation. The patient is nervous/anxious.        Objective:   Physical Exam  Constitutional: She is  oriented to person, place, and time. She appears well-developed and well-nourished. No distress.  HENT:  Head: Normocephalic.  Right Ear: External ear normal.  Left Ear: External ear normal.  Mouth/Throat: Oropharynx is clear and moist.  Eyes: Conjunctivae and EOM are normal. Pupils are equal, round, and reactive to light.  Neck: Normal range of motion. Neck supple. No thyromegaly present.  Cardiovascular: Normal rate, regular rhythm, normal heart sounds and intact distal pulses.   Pulmonary/Chest: Effort normal and breath sounds normal.  Abdominal: Soft. Bowel sounds are normal. She exhibits no mass. There is no tenderness.  Musculoskeletal: Normal range of motion.  Lymphadenopathy:    She has no cervical adenopathy.  Neurological: She is alert and oriented to person, place, and time.  Skin: Skin is warm and dry. No rash noted.  Thinning of the frontal scalp area especially on the left  Psychiatric: She has a normal mood and affect. Her behavior is normal.          Assessment & Plan:   Hair loss. This appears to have stabilized. We'll check a thyroid level and continue on multivitamins. If this continues may need to consider a trial of Topamax or consider dermatological referral Anxiety disorder. We'll increase Prozac to 30 mg daily

## 2012-10-31 ENCOUNTER — Telehealth: Payer: Self-pay | Admitting: Internal Medicine

## 2012-10-31 NOTE — Telephone Encounter (Signed)
Informed patient that Dr. Kirtland Bouchard has not read results yet. And as soon as Dr. Kirtland Bouchard reads the labs we will give her a call back

## 2012-10-31 NOTE — Telephone Encounter (Signed)
Pt would like results of labs done 10/29. pls call

## 2012-11-03 ENCOUNTER — Telehealth: Payer: Self-pay | Admitting: Neurology

## 2012-11-03 NOTE — Telephone Encounter (Signed)
Topiramate and Rizatriptan  Along with Xanax is causing drowniness, dizziness. Is there anything that she can take for migraines that will not cause this,. She wanted to let doctor know that she  had panic,  Anxiety attacks  last week.

## 2012-11-04 NOTE — Telephone Encounter (Signed)
Chart reviewed, better address her medicine issues during clinical visit, please give her a follow up visit with Eber Jones or me on next available.

## 2012-11-06 ENCOUNTER — Other Ambulatory Visit: Payer: Self-pay

## 2012-11-06 ENCOUNTER — Encounter: Payer: Self-pay | Admitting: Internal Medicine

## 2012-11-11 ENCOUNTER — Ambulatory Visit (INDEPENDENT_AMBULATORY_CARE_PROVIDER_SITE_OTHER): Payer: 59 | Admitting: Licensed Clinical Social Worker

## 2012-11-11 DIAGNOSIS — F331 Major depressive disorder, recurrent, moderate: Secondary | ICD-10-CM

## 2012-11-16 ENCOUNTER — Telehealth: Payer: Self-pay | Admitting: Neurology

## 2012-11-16 MED ORDER — NAPROXEN 250 MG PO TABS: 250.0000 mg | ORAL_TABLET | Freq: Two times a day (BID) | ORAL | Status: DC

## 2012-11-16 NOTE — Telephone Encounter (Signed)
Patient calls for concerns of headache, vertigo, anxiety, "mental fog". Reports the triptan and topamax make her feel worse and interact with her prozac. Instructed her to follow up with Dr Terrace Arabia in regards to potentially switching prophylactic agent. Will try naproxen prn for symptomatic relief of headache. Prescription sent to [facility] in [place].

## 2012-11-18 ENCOUNTER — Ambulatory Visit (INDEPENDENT_AMBULATORY_CARE_PROVIDER_SITE_OTHER): Payer: 59 | Admitting: Licensed Clinical Social Worker

## 2012-11-18 DIAGNOSIS — F331 Major depressive disorder, recurrent, moderate: Secondary | ICD-10-CM

## 2012-11-25 ENCOUNTER — Ambulatory Visit (INDEPENDENT_AMBULATORY_CARE_PROVIDER_SITE_OTHER): Payer: 59 | Admitting: Licensed Clinical Social Worker

## 2012-11-25 DIAGNOSIS — F331 Major depressive disorder, recurrent, moderate: Secondary | ICD-10-CM

## 2012-11-27 ENCOUNTER — Other Ambulatory Visit: Payer: Self-pay | Admitting: Internal Medicine

## 2012-12-02 ENCOUNTER — Telehealth: Payer: Self-pay | Admitting: Neurology

## 2012-12-02 ENCOUNTER — Ambulatory Visit: Payer: 59 | Admitting: Licensed Clinical Social Worker

## 2012-12-02 NOTE — Telephone Encounter (Signed)
Patient has some concerns about her medication. Patient states that the maxalt interferes with her prozac, sending too much serotonin into her blood stream. He topiramate was changed by her primary physician from 100 mg tablets to two 50 mg tablets as 100 mg tablets were too big. Patient has had vertigo since Saturday with a migraine. The only thing she is presently taking for pain is asa. Dr. Hosie Poisson [facility]) ordered naproxen and patient feels that it doesn't work and she doesn't want to take a medication that she has to take with food. Patient has returned to taking only asa (325 mg) for pain, about eight per day. Patient wants to know if she can start off taking 50 mg topiramate at night for a month and then increase to 100 mg, as she is having a terrible time with dizziness when she wakes at night.   I discussed that these symptoms are common and usually resolve in about a month. I also discussed the problem of taking such large quantities of asa.   Please advise.

## 2012-12-03 ENCOUNTER — Telehealth: Payer: Self-pay | Admitting: Internal Medicine

## 2012-12-03 ENCOUNTER — Encounter: Payer: Self-pay | Admitting: *Deleted

## 2012-12-03 NOTE — Telephone Encounter (Signed)
Spoke with patient.  She complains of migraine, vertigo, extreme fatigue, anxiety and depression.  An appointment today with Dr Selena Batten was offered for today but patient declined.  Appointment made with Dr Kirtland Bouchard tomorrow.  Patient may try OTC sleep aid tonight if needed.

## 2012-12-03 NOTE — Telephone Encounter (Signed)
Triage completed by Charlene Brooke, RN  Patient Information: Caller Name: Sybel Phone: [phone] Patient: Allison Jimenez, Allison Jimenez Gender: Female DOB: 21-Mar-1950 Age: 62 Years PCP: Eleonore Chiquito (Family Practice > 7yrs old)  Office Follow Up: Does the office need to follow up with this patient?: Yes Instructions For The Office: See RN Note.  RN Note: Information provided to Samara Deist who will discuss with Dr. Kirtland Bouchard. Patient will anticipate a follow up phone call from the office.  Symptoms Reason For Call & Symptoms: Onset of Headache and Vertigo 11/29. Patient reports by 12/1 she could hardly walk. Patient now calling to report on 12/3 having "no energy" and feeling "exhausted" and unable to go in to work.  PMH Anxiety/Depression; has recently started counseling which has been helpful per patient report although she reports meds for anxiety/depression and headaches have not seemed to be helping. Reviewed Health History In EMR: Yes Reviewed Medications In EMR: Yes Reviewed Allergies In EMR: Yes Reviewed Surgeries / Procedures: Yes Date of Onset of Symptoms: 11/29/2012 Treatments Tried: Rest Treatments Tried Worked: No  Guideline(s) Used: Weakness (Generalized) and Fatigue  Disposition Per Guideline:   Go to Office Now  Reason For Disposition Reached:   Moderate weakness (i.e., interferes with work, school, normal activities) and cause unknown  Advice Given: Reassurance Not drinking enough fluids and being a little dehydrated is a common cause of mild weakness. Fluids : Drink several glasses of fruit juice, other clear fluids, or water. This will improve hydration and blood glucose. Rest : Lie down with feet elevated for 1 hour. This will improve blood flow and increase blood flow to the brain.  Patient Will Follow Care Advice: YES

## 2012-12-04 ENCOUNTER — Encounter: Payer: Self-pay | Admitting: Internal Medicine

## 2012-12-04 ENCOUNTER — Ambulatory Visit (INDEPENDENT_AMBULATORY_CARE_PROVIDER_SITE_OTHER)
Admission: RE | Admit: 2012-12-04 | Discharge: 2012-12-04 | Disposition: A | Discharge status: Home / Self Care | Payer: Managed Care, Other (non HMO) | Source: Ambulatory Visit | Attending: Internal Medicine | Admitting: Internal Medicine

## 2012-12-04 ENCOUNTER — Ambulatory Visit (INDEPENDENT_AMBULATORY_CARE_PROVIDER_SITE_OTHER): Payer: Managed Care, Other (non HMO) | Admitting: Internal Medicine

## 2012-12-04 VITALS — BP 140/80 | HR 92 | Temp 98.0°F | Resp 20 | Wt 128.0 lb

## 2012-12-04 DIAGNOSIS — F17200 Nicotine dependence, unspecified, uncomplicated: Secondary | ICD-10-CM

## 2012-12-04 DIAGNOSIS — G43909 Migraine, unspecified, not intractable, without status migrainosus: Secondary | ICD-10-CM

## 2012-12-04 DIAGNOSIS — E785 Hyperlipidemia, unspecified: Secondary | ICD-10-CM

## 2012-12-04 DIAGNOSIS — F411 Generalized anxiety disorder: Secondary | ICD-10-CM

## 2012-12-04 DIAGNOSIS — R42 Dizziness and giddiness: Secondary | ICD-10-CM

## 2012-12-04 DIAGNOSIS — H8109 Meniere's disease, unspecified ear: Secondary | ICD-10-CM

## 2012-12-04 LAB — COMPREHENSIVE METABOLIC PANEL
ALT: 17 U/L (ref 0–35)
AST: 21 U/L (ref 0–37)
Alkaline Phosphatase: 51 U/L (ref 39–117)
BUN: 11 mg/dL (ref 6–23)
Calcium: 10.1 mg/dL (ref 8.4–10.5)
Chloride: 92 mEq/L — ABNORMAL LOW (ref 96–112)
Creatinine, Ser: 0.8 mg/dL (ref 0.4–1.2)
Glucose, Bld: 100 mg/dL — ABNORMAL HIGH (ref 70–99)
Total Bilirubin: 0.3 mg/dL (ref 0.3–1.2)

## 2012-12-04 LAB — CBC WITH DIFFERENTIAL/PLATELET
Basophils Relative: 0.5 % (ref 0.0–3.0)
Eosinophils Absolute: 0.1 10*3/uL (ref 0.0–0.7)
HCT: 43.8 % (ref 36.0–46.0)
Hemoglobin: 14.9 g/dL (ref 12.0–15.0)
Lymphocytes Relative: 20.1 % (ref 12.0–46.0)
Lymphs Abs: 2.4 10*3/uL (ref 0.7–4.0)
MCHC: 34.1 g/dL (ref 30.0–36.0)
Monocytes Absolute: 0.8 10*3/uL (ref 0.1–1.0)
Neutro Abs: 8.3 10*3/uL — ABNORMAL HIGH (ref 1.4–7.7)
RBC: 5.14 Mil/uL — ABNORMAL HIGH (ref 3.87–5.11)
WBC: 11.7 10*3/uL — ABNORMAL HIGH (ref 4.5–10.5)

## 2012-12-04 MED ORDER — CLONAZEPAM 0.5 MG PO TABS: 0.5000 mg | ORAL_TABLET | Freq: Two times a day (BID) | ORAL | Status: DC

## 2012-12-04 MED ORDER — FLUOXETINE HCL 10 MG PO CAPS: 20.0000 mg | ORAL_CAPSULE | Freq: Every day | ORAL | Status: DC

## 2012-12-04 NOTE — Telephone Encounter (Signed)
I have advise her, it is OK to titrating up her Topamax 50mg  to 2 tab po bid, she complains of worsening dizziness, headaches, anxiety, depression, she was started on clonazepam 0.5mg , She complains of naproxen does not work well.    She is no longer taking maxalt prn, worrying about the potential side effect, she is also taking prozac. Maxalt helps her headache very well.   She should stop daily 6-8 tabs of ASA. mobic as needed for her headaches.

## 2012-12-04 NOTE — Progress Notes (Signed)
Pre-visit discussion using our clinic review tool. No additional management support is needed unless otherwise documented below in the visit note.  

## 2012-12-04 NOTE — Patient Instructions (Signed)
It is important that you exercise regularly, at least 20 minutes 3 to 4 times per week.  If you develop chest pain or shortness of breath seek  medical attention.  Return in one month for follow-up  

## 2012-12-04 NOTE — Progress Notes (Signed)
Subjective:    Patient ID: Allison Jimenez, female    DOB: June 22, 1950, 62 y.o.   MRN: [id number]  HPI  62 year old patient who is seen today as an acute visit due to worsening anxiety insomnia and migraine headaches. She has been evaluated by neurology and also has had a fairly recent MRI. She is quite fearful of new drugs and has not been using Topamax. She has benefited from Taxol but is concerned about serotonin syndrome. She describes increase in anxiety stress. She does receive counseling but she feels that her present counselor may not be a good fit for her.  She states that she has been fearful of staying alone and has had worsening anxiety and panic. She complains of worsening migraine headaches as well as worsening vertigo she does have a history of Mnire's disease Medical regimen includes alprazolam which she takes on a fairly modest dose  Past Medical History  Diagnosis Date  . ANXIETY 06/05/2006  . BACK PAIN 01/05/2010  . DIVERTICULOSIS, COLON 06/05/2006  . DIZZINESS OR VERTIGO 06/05/2006  . HYPERLIPIDEMIA 06/05/2006  . HYPOKALEMIA 12/14/2008  . MENIERE'S DISEASE 06/25/2006  . TOBACCO USER 11/25/2007    History   Social History  . Marital Status: Married    Spouse Name: N/A    Number of Children: 1  . Years of Education: 13   Occupational History  .      Retired   Social History Main Topics  . Smoking status: Current Some Day Smoker  . Smokeless tobacco: Never Used     Comment: using electronic cigs.  . Alcohol Use: No  . Drug Use: No  . Sexual Activity: Not on file   Other Topics Concern  . Not on file   Social History Narrative   Patient lives at home alone. Patient has a Biochemist, clinical.     Past Surgical History  Procedure Laterality Date  . Appendectomy    . Cholecystectomy    . Cosmetic surgery    . Tubal ligation    . Carpal tunnel release    . Vocal cord surgery      nodule  . Mastoid surgery      Family History  Problem Relation Age of  Onset  . Migraines    . Depression    . Anxiety disorder    . High Cholesterol      Allergies  Allergen Reactions  . Erythromycin Base   . Penicillins     Current Outpatient Prescriptions on File Prior to Visit  Medication Sig Dispense Refill  . ALPRAZolam (XANAX) 0.25 MG tablet TAKE 1 TABLET BY MOUTH 3 TIMES A DAY AS NEEDED  90 tablet  2  . atorvastatin (LIPITOR) 10 MG tablet Take 1 tablet (10 mg total) by mouth daily.  90 tablet  6  . chlorthalidone (HYGROTON) 25 MG tablet Take 1 tablet (25 mg total) by mouth daily.  90 tablet  3  . FLUoxetine (PROZAC) 20 MG tablet Take 1 tablet (20 mg total) by mouth daily.  90 tablet  3  . meloxicam (MOBIC) 7.5 MG tablet Take 7.5 mg by mouth daily as needed.      . naproxen (NAPROSYN) 250 MG tablet Take 1 tablet (250 mg total) by mouth 2 (two) times daily with a meal.  30 tablet  0  . PATADAY 0.2 % SOLN       . potassium chloride 20 MEQ/15ML (10%) solution Take 15 mLs (20 mEq total) by mouth daily.  300 mL  6  . topiramate (TOPAMAX) 50 MG tablet Take 1 tablet (50 mg total) by mouth 2 (two) times daily.  180 tablet  4  . fluticasone (FLONASE) 50 MCG/ACT nasal spray Place 2 sprays into the nose daily as needed.       No current facility-administered medications on file prior to visit.    BP 140/80  Pulse 92  Temp(Src) 98 F (36.7 C) (Oral)  Resp 20  Wt 128 lb (58.06 kg)  SpO2 98%        Review of Systems  Neurological: Positive for dizziness, weakness, light-headedness and headaches.  Psychiatric/Behavioral: Positive for sleep disturbance and dysphoric mood. The patient is nervous/anxious.        Objective:   Physical Exam  Constitutional: She appears well-developed and well-nourished. No distress.  Quite anxious and at times tearful vital signs stable   Psychiatric: Judgment and thought content normal.  Anxious and depressed           Assessment & Plan:  Anxiety/panic disorder. Will place on clonazepam 0.5 mg at  bedtime and will continue when necessary alprazolam. Anxiety sleep and vertigo hopefully will be improved with a longer acting benzodiazepine. She was given 3 references for psychiatric followup. She will check with the offices to see if they carry her present insurance Migraine headaches Vertigo  Followup neurology Psychiatric referral  Increase Prozac to 30 mg daily Recheck in 4 weeks or when necessary A letter was dictated for a 4 week leave of absence Per her request a chest x-ray and lab will be reviewed

## 2012-12-05 ENCOUNTER — Ambulatory Visit: Payer: Self-pay | Admitting: Internal Medicine

## 2012-12-05 NOTE — Telephone Encounter (Signed)
Message copied by Levert Feinstein on Fri Dec 05, 2012  1:01 PM ------      Message from: [facility], [place]      Created: Mon Dec 01, 2012  4:18 PM      Regarding: Shaquina Gillham DOB:[birth year]      Contact: [phone]       Dr. Cheree Ditto would like for you to call him regarding this patient.  The number at the top is his cell number and his office number is [phone].            Thank you ------

## 2012-12-05 NOTE — Telephone Encounter (Signed)
Allison Jimenez, please connect me to Dr. Haroldine Laws. Thanks

## 2012-12-09 ENCOUNTER — Telehealth: Payer: Self-pay | Admitting: Internal Medicine

## 2012-12-09 NOTE — Telephone Encounter (Signed)
Pt would like donna to return her call concerning her meds. Pt was seen on 12-04-12

## 2012-12-09 NOTE — Telephone Encounter (Signed)
Spoke to pt c/o feeling dizziness and disoriented was seen in the [place] ED for Migraine and dizziness was given pain medication and fluids. Pt said she is still feeling dizzy and thinks medication is too strong the Clonazepam that she was started on. Put pt on hold and discussed with Dr.K . Told pt Dr. Kirtland Bouchard said to stop Clonazepam and see if that helps with dizziness. Pt verbalized understanding. Told pt to call me back Thurs with an update on how she is feeling increase we need to change things before Dr.K goes on vacation. Pt verbalized understanding.

## 2012-12-11 ENCOUNTER — Ambulatory Visit: Payer: 59 | Admitting: Licensed Clinical Social Worker

## 2012-12-15 NOTE — Telephone Encounter (Signed)
Left message on voicemail to call office.  

## 2012-12-16 NOTE — Telephone Encounter (Signed)
FYI

## 2012-12-16 NOTE — Telephone Encounter (Signed)
Pt stated she is doing better has a follow up appointment with Dr. Kirtland Bouchard in January. Told her I will left Dr. Kirtland Bouchard know.

## 2012-12-16 NOTE — Telephone Encounter (Signed)
Spoke to pt stated she stopped Topamax last Thurs and dizziness and fogginess is better, had little headaches took Tylenol with relief, but is seeing specialist [facility] next week. Pt also said she bought a treadmill that she is using everyday.

## 2013-01-05 ENCOUNTER — Ambulatory Visit (INDEPENDENT_AMBULATORY_CARE_PROVIDER_SITE_OTHER): Payer: Managed Care, Other (non HMO) | Admitting: Internal Medicine

## 2013-01-05 ENCOUNTER — Encounter: Payer: Self-pay | Admitting: Internal Medicine

## 2013-01-05 VITALS — BP 130/66 | HR 87 | Temp 98.0°F | Resp 20 | Ht 61.0 in | Wt 127.0 lb

## 2013-01-05 DIAGNOSIS — G43909 Migraine, unspecified, not intractable, without status migrainosus: Secondary | ICD-10-CM

## 2013-01-05 DIAGNOSIS — F411 Generalized anxiety disorder: Secondary | ICD-10-CM

## 2013-01-05 DIAGNOSIS — E876 Hypokalemia: Secondary | ICD-10-CM

## 2013-01-05 NOTE — Progress Notes (Signed)
Subjective:    Patient ID: Allison Jimenez, female    DOB: 1950/04/12, 63 y.o.   MRN: [id number]  HPI  63 year old patient who is seen for her one-month followup. She was placed on a 4 week leave of absence to 2 severe anxiety migraine headaches with vertigo.  She feels somewhat improved but still quite anxious. Her son has been with her for the past 2 weeks which has been quite helpful. He still has occasional migraine headaches. Since her last visit here she was seen at the headache wellness Center which she states she did not find helpful.  Her Prozac dose has been up titrated.  She felt quite poorly on a combination of clonazepam and when necessary alprazolam. At the present time she is taking one half of a 0.25 mg tablet every 3-4 hours. She anticipates being relieved from her job in 2 months. She has received counseling but still no psychiatric appointment. She still does not feel comfortable to drive and wishes to see a psychiatrist in the [place]  area  Past Medical History  Diagnosis Date  . ANXIETY 06/05/2006  . BACK PAIN 01/05/2010  . DIVERTICULOSIS, COLON 06/05/2006  . DIZZINESS OR VERTIGO 06/05/2006  . HYPERLIPIDEMIA 06/05/2006  . HYPOKALEMIA 12/14/2008  . MENIERE'S DISEASE 06/25/2006  . TOBACCO USER 11/25/2007    History   Social History  . Marital Status: Married    Spouse Name: N/A    Number of Children: 1  . Years of Education: 13   Occupational History  .      Retired   Social History Main Topics  . Smoking status: Current Some Day Smoker  . Smokeless tobacco: Never Used     Comment: using electronic cigs.  . Alcohol Use: No  . Drug Use: No  . Sexual Activity: Not on file   Other Topics Concern  . Not on file   Social History Narrative   Patient lives at home alone. Patient has a Data processing manager.     Past Surgical History  Procedure Laterality Date  . Appendectomy    . Cholecystectomy    . Cosmetic surgery    . Tubal ligation    . Carpal tunnel  release    . Vocal cord surgery      nodule  . Mastoid surgery      Family History  Problem Relation Age of Onset  . Migraines    . Depression    . Anxiety disorder    . High Cholesterol      Allergies  Allergen Reactions  . Erythromycin Base   . Penicillins     Current Outpatient Prescriptions on File Prior to Visit  Medication Sig Dispense Refill  . ALPRAZolam (XANAX) 0.25 MG tablet TAKE 1 TABLET BY MOUTH 3 TIMES A DAY AS NEEDED  90 tablet  2  . atorvastatin (LIPITOR) 10 MG tablet Take 1 tablet (10 mg total) by mouth daily.  90 tablet  6  . chlorthalidone (HYGROTON) 25 MG tablet Take 1 tablet (25 mg total) by mouth daily.  90 tablet  3  . FLUoxetine (PROZAC) 10 MG capsule Take 2 capsules (20 mg total) by mouth daily.  90 capsule  2  . FLUoxetine (PROZAC) 20 MG tablet Take 1 tablet (20 mg total) by mouth daily.  90 tablet  3  . fluticasone (FLONASE) 50 MCG/ACT nasal spray Place 2 sprays into the nose daily as needed.      . meloxicam (MOBIC) 7.5 MG  tablet Take 7.5 mg by mouth daily as needed.      . naproxen (NAPROSYN) 250 MG tablet Take 1 tablet (250 mg total) by mouth 2 (two) times daily with a meal.  30 tablet  0  . PATADAY 0.2 % SOLN       . potassium chloride 20 MEQ/15ML (10%) solution Take 15 mLs (20 mEq total) by mouth daily.  300 mL  6   No current facility-administered medications on file prior to visit.    BP 130/66  Pulse 87  Temp(Src) 98 F (36.7 C) (Oral)  Resp 20  Ht 5\' 1"  (1.549 m)  Wt 127 lb (57.607 kg)  BMI 24.01 kg/m2  SpO2 98%     Review of Systems  Constitutional: Negative.   HENT: Negative for congestion, dental problem, hearing loss, rhinorrhea, sinus pressure, sore throat and tinnitus.   Eyes: Negative for pain, discharge and visual disturbance.  Respiratory: Negative for cough and shortness of breath.   Cardiovascular: Negative for chest pain, palpitations and leg swelling.  Gastrointestinal: Negative for nausea, vomiting, abdominal  pain, diarrhea, constipation, blood in stool and abdominal distention.  Genitourinary: Negative for dysuria, urgency, frequency, hematuria, flank pain, vaginal bleeding, vaginal discharge, difficulty urinating, vaginal pain and pelvic pain.  Musculoskeletal: Negative for arthralgias, gait problem and joint swelling.  Skin: Negative for rash.  Neurological: Negative for dizziness, syncope, speech difficulty, weakness, numbness and headaches.  Hematological: Negative for adenopathy.  Psychiatric/Behavioral: Positive for behavioral problems, sleep disturbance, dysphoric mood and decreased concentration. Negative for suicidal ideas and agitation. The patient is nervous/anxious.        Objective:   Physical Exam  Constitutional: She appears well-developed and well-nourished. No distress.  Psychiatric: She has a normal mood and affect. Her behavior is normal. Judgment and thought content normal.  Accompanied by her son Alert appropriate Much less anxious           Assessment & Plan:   Anxiety disorder Mnire's with recurrent vertigo Migraine headaches  The patient will be seen by psychiatry. An appointment will be attempted to be made near her home. Medical regimen unchanged. Medical leave of absence extended for 8 weeks. We'll reassess at that time or by psychiatry

## 2013-01-05 NOTE — Patient Instructions (Signed)
Psychiatric followup as discussed    It is important that you exercise regularly, at least 20 minutes 3 to 4 times per week.  If you develop chest pain or shortness of breath seek  medical attention.  Return in 2 months for followup or as needed

## 2013-01-05 NOTE — Progress Notes (Signed)
Pre-visit discussion using our clinic review tool. No additional management support is needed unless otherwise documented below in the visit note.  

## 2013-01-09 ENCOUNTER — Ambulatory Visit: Payer: Managed Care, Other (non HMO) | Admitting: Neurology

## 2013-01-12 ENCOUNTER — Ambulatory Visit: Payer: Self-pay | Admitting: Internal Medicine

## 2013-01-22 ENCOUNTER — Other Ambulatory Visit: Payer: Self-pay | Admitting: Internal Medicine

## 2013-02-13 ENCOUNTER — Telehealth: Payer: Self-pay | Admitting: Internal Medicine

## 2013-02-13 NOTE — Telephone Encounter (Signed)
Patient Information:  Caller Name: Keilin  Phone: [phone]  Patient: Allison Jimenez, Allison Jimenez  Gender: Female  DOB: May 24, 1950  Age: 63 Years  PCP: Bluford Kaufmann (Family Practice > 59yrs old)  Office Follow Up:  Does the office need to follow up with this patient?: No  Instructions For The Office: N/A  RN Note:  Called to provide update of symptoms to PCP.  Diagnosed with pneumoinia.  No shortness of breath. Intermittent cough.  Drinking fluids and voiding. Verified side effects of Levofloxacin per Web MD including:  "Nausea, diarrhea, headache, dizziness, lightheadedness, or trouble sleeping may occur. If any of these effects persist or worsen, tell your doctor or pharmacist promptly." Call scheduler 02/16/13 if symptoms not improving for earlier appointment.  Currently scheduled for 02/19/13.  Symptoms  Reason For Call & Symptoms: Called to update MD about symptoms. On 02/10/13, had vertigo with "sinus symptoms" (rhinorrhea and nasal congestion) .  ENT ordered Doxicycline without seeing patient.  Went to [facility] 02/11/13; diagnosed with dehydration so sent to ED for fluids.  [place] [facility] 02/11/13 diagnosed community aquired pneumonia.    Antibiotic was changed from Doxicycline to Levofloxacin 750 mg po daily. Coninues to have nausea, anxiety, sleeping poorly and no energy.  Reviewed Health History In EMR: Yes  Reviewed Medications In EMR: Yes  Reviewed Allergies In EMR: Yes  Reviewed Surgeries / Procedures: Yes  Date of Onset of Symptoms: 02/11/2013  Treatments Tried: Levofloxacin, > water intake.  Treatments Tried Worked: No  Guideline(s) Used:  Infection on Antibiotic Follow-up Call  Disposition Per Guideline:   See Within 3 Days in Office  Reason For Disposition Reached:   Needs infection re-check appointment (per nurse judgment)  Advice Given:  Reassurance - Symptoms the Same and on Antibiotics for Less than 72 Hours:  Your child has been taking an antibiotic for less than 72 hours (3  days).  Most bacterial infections do not respond to the first dose of an antibiotic.  Often there is no improvement the first day.  Children gradually feel better after 2 or 3 days on antibiotics.  Continue Antibiotic:  Continue giving the antibiotic.  Also, continue any other treatment instructions from your child's doctor.  Fever Medicine:  Fevers only need to be treated with medicine if they cause discomfort.  RN Overrode Recommendation:  Patient Already Has Appt, Document Patient  Will call to rescheduled appointment if not improved within 72 hours.

## 2013-02-15 ENCOUNTER — Other Ambulatory Visit: Payer: Self-pay | Admitting: Internal Medicine

## 2013-02-24 ENCOUNTER — Telehealth: Payer: Self-pay | Admitting: Internal Medicine

## 2013-02-24 NOTE — Telephone Encounter (Signed)
PT has questions for Butch Penny regarding clearance to return to work, not sure if she actually need an appt for this.

## 2013-02-24 NOTE — Telephone Encounter (Signed)
Spoke to pt told her she needs to keep appointment in order to get release to go back to work. Pt verbalized understanding.

## 2013-02-25 ENCOUNTER — Ambulatory Visit (INDEPENDENT_AMBULATORY_CARE_PROVIDER_SITE_OTHER): Payer: Managed Care, Other (non HMO) | Admitting: Internal Medicine

## 2013-02-25 ENCOUNTER — Encounter: Payer: Self-pay | Admitting: Internal Medicine

## 2013-02-25 VITALS — BP 116/60 | HR 102 | Temp 98.1°F | Resp 20 | Ht 61.0 in | Wt 130.0 lb

## 2013-02-25 DIAGNOSIS — F411 Generalized anxiety disorder: Secondary | ICD-10-CM

## 2013-02-25 DIAGNOSIS — H8109 Meniere's disease, unspecified ear: Secondary | ICD-10-CM

## 2013-02-25 NOTE — Progress Notes (Signed)
Subjective:    Patient ID: Allison Jimenez, female    DOB: Dec 18, 1950, 63 y.o.   MRN: [id number]  HPI  63 year old patient who presents today after an additional 62 week leave of absence.  She is doing much better, but still has some chronic anxiety issues.  Since her last visit here.  She was seen at the [facility] , ED and treated for sinusitis, community-acquired pneumonia and a flare of her Mnire's disease.  Today she is much improved.  Apparently, due to her prolonged absence.  Her job is no longer available.  She is improved, however, and is requesting a letter stating that she is medically able to return to work.  She does state however, due to her chronic anxiety.  She does not feel that she is ever able to work full-time.  She also wonders whether Mnire's disease would qualify her for disability.  Still no psychiatric evaluation.  This again was stressed today  Past Medical History  Diagnosis Date  . ANXIETY 06/05/2006  . BACK PAIN 01/05/2010  . DIVERTICULOSIS, COLON 06/05/2006  . DIZZINESS OR VERTIGO 06/05/2006  . HYPERLIPIDEMIA 06/05/2006  . HYPOKALEMIA 12/14/2008  . MENIERE'S DISEASE 06/25/2006  . TOBACCO USER 11/25/2007    History   Social History  . Marital Status: Married    Spouse Name: N/A    Number of Children: 1  . Years of Education: 13   Occupational History  .      Retired   Social History Main Topics  . Smoking status: Current Some Day Smoker  . Smokeless tobacco: Never Used     Comment: using electronic cigs.  . Alcohol Use: No  . Drug Use: No  . Sexual Activity: Not on file   Other Topics Concern  . Not on file   Social History Narrative   Patient lives at home alone. Patient has a Data processing manager.     Past Surgical History  Procedure Laterality Date  . Appendectomy    . Cholecystectomy    . Cosmetic surgery    . Tubal ligation    . Carpal tunnel release    . Vocal cord surgery      nodule  . Mastoid surgery      Family History  Problem  Relation Age of Onset  . Migraines    . Depression    . Anxiety disorder    . High Cholesterol      Allergies  Allergen Reactions  . Erythromycin Base   . Penicillins     Current Outpatient Prescriptions on File Prior to Visit  Medication Sig Dispense Refill  . ALPRAZolam (XANAX) 0.25 MG tablet TAKE 1 TABLET BY MOUTH 3 TIMES A DAY AS NEEDED  90 tablet  2  . atorvastatin (LIPITOR) 10 MG tablet Take 1 tablet (10 mg total) by mouth daily.  90 tablet  6  . chlorthalidone (HYGROTON) 25 MG tablet Take 1 tablet (25 mg total) by mouth daily.  90 tablet  3  . FLUoxetine (PROZAC) 10 MG capsule Take 2 capsules (20 mg total) by mouth daily.  90 capsule  2  . FLUoxetine (PROZAC) 20 MG tablet Take 1 tablet (20 mg total) by mouth daily.  90 tablet  3  . meloxicam (MOBIC) 7.5 MG tablet Take 7.5 mg by mouth daily as needed.      . naproxen (NAPROSYN) 250 MG tablet Take 1 tablet (250 mg total) by mouth 2 (two) times daily with a meal.  30  tablet  0  . PATADAY 0.2 % SOLN       . potassium chloride 20 MEQ/15ML (10%) solution Take 15 mLs (20 mEq total) by mouth daily.  300 mL  6  . promethazine (PHENERGAN) 25 MG tablet TAKE 1 TABLET EVERY 8 HOURS AS NEEDED FOR NAUSEA  20 tablet  0   No current facility-administered medications on file prior to visit.    BP 116/60  Pulse 102  Temp(Src) 98.1 F (36.7 C) (Oral)  Resp 20  Ht 5\' 1"  (1.549 m)  Wt 130 lb (58.968 kg)  BMI 24.58 kg/m2  SpO2 95%       Review of Systems  Constitutional: Negative.   HENT: Negative for congestion, dental problem, hearing loss, rhinorrhea, sinus pressure, sore throat and tinnitus.   Eyes: Negative for pain, discharge and visual disturbance.  Respiratory: Negative for cough and shortness of breath.   Cardiovascular: Negative for chest pain, palpitations and leg swelling.  Gastrointestinal: Negative for nausea, vomiting, abdominal pain, diarrhea, constipation, blood in stool and abdominal distention.  Genitourinary:  Negative for dysuria, urgency, frequency, hematuria, flank pain, vaginal bleeding, vaginal discharge, difficulty urinating, vaginal pain and pelvic pain.  Musculoskeletal: Negative for arthralgias, gait problem and joint swelling.  Skin: Negative for rash.  Neurological: Positive for dizziness. Negative for syncope, speech difficulty, weakness, numbness and headaches.  Hematological: Negative for adenopathy.  Psychiatric/Behavioral: Positive for decreased concentration. Negative for behavioral problems, dysphoric mood and agitation. The patient is nervous/anxious.        Objective:   Physical Exam  Constitutional: She is oriented to person, place, and time. She appears well-developed and well-nourished.  HENT:  Head: Normocephalic.  Right Ear: External ear normal.  Left Ear: External ear normal.  Mouth/Throat: Oropharynx is clear and moist.  Eyes: Conjunctivae and EOM are normal. Pupils are equal, round, and reactive to light.  Neck: Normal range of motion. Neck supple. No thyromegaly present.  Cardiovascular: Normal rate, regular rhythm, normal heart sounds and intact distal pulses.   Pulmonary/Chest: Effort normal and breath sounds normal.  Abdominal: Soft. She exhibits no mass. There is no tenderness.  Musculoskeletal: She exhibits no edema.  Lymphadenopathy:    She has no cervical adenopathy.  Neurological: She is alert and oriented to person, place, and time.  Skin: Skin is warm and dry. No rash noted.  Psychiatric: She has a normal mood and affect. Her behavior is normal. Judgment and thought content normal.          Assessment & Plan:   Adjustment disorder with next anxiety and depression improved.  Continue present regimen.  Psychiatric followup recommended Status post pneumonia.  Resolved Mnire's disease.  Chronic with persistent exacerbations.  Followup ENT

## 2013-02-25 NOTE — Progress Notes (Signed)
Pre-visit discussion using our clinic review tool. No additional management support is needed unless otherwise documented below in the visit note.  

## 2013-02-25 NOTE — Patient Instructions (Signed)
Followup psychiatry and ENT  Return in 6 months for follow-up

## 2013-02-26 ENCOUNTER — Ambulatory Visit: Payer: Managed Care, Other (non HMO) | Admitting: Internal Medicine

## 2013-02-27 ENCOUNTER — Telehealth: Payer: Self-pay | Admitting: Internal Medicine

## 2013-02-27 ENCOUNTER — Ambulatory Visit: Payer: Managed Care, Other (non HMO) | Admitting: Internal Medicine

## 2013-02-27 NOTE — Telephone Encounter (Signed)
Relevant patient education assigned to patient using Emmi. ° °

## 2013-03-16 ENCOUNTER — Telehealth: Payer: Self-pay | Admitting: Internal Medicine

## 2013-03-16 NOTE — Telephone Encounter (Signed)
Pt would like to inform you she applied for SS benefits and they are going to contact the office for her records.

## 2013-03-16 NOTE — Telephone Encounter (Signed)
Noted  

## 2013-03-17 ENCOUNTER — Other Ambulatory Visit: Payer: Self-pay | Admitting: Internal Medicine

## 2013-03-20 NOTE — Telephone Encounter (Signed)
Pt called in about her ALPRAZolam Allison Jimenez) 0.25 MG tablet re-fill sent to [facility] on [address].    Abbott Pao can be reached at [phone]

## 2013-07-10 ENCOUNTER — Other Ambulatory Visit: Payer: Self-pay | Admitting: Internal Medicine

## 2013-07-14 ENCOUNTER — Other Ambulatory Visit: Payer: Self-pay | Admitting: Internal Medicine

## 2013-07-20 ENCOUNTER — Other Ambulatory Visit: Payer: Self-pay | Admitting: Internal Medicine

## 2013-07-22 ENCOUNTER — Telehealth: Payer: Self-pay | Admitting: Internal Medicine

## 2013-07-22 NOTE — Telephone Encounter (Signed)
Pt is wanting to come in to see dr. Raliegh Ip to discuss increase her rxFLUoxetine (PROZAC) 30 MG capsule  Pt states she will be in [place] on Friday. OK to use sda slot?

## 2013-07-23 NOTE — Telephone Encounter (Signed)
Ok to schedule in [place]

## 2013-07-29 NOTE — Telephone Encounter (Signed)
appt scheduled for pt.  

## 2013-08-25 ENCOUNTER — Ambulatory Visit (INDEPENDENT_AMBULATORY_CARE_PROVIDER_SITE_OTHER): Payer: Managed Care, Other (non HMO) | Admitting: Internal Medicine

## 2013-08-25 ENCOUNTER — Encounter: Payer: Self-pay | Admitting: Internal Medicine

## 2013-08-25 VITALS — BP 90/50 | HR 79 | Temp 98.1°F | Resp 18 | Ht 61.0 in | Wt 130.0 lb

## 2013-08-25 DIAGNOSIS — F411 Generalized anxiety disorder: Secondary | ICD-10-CM

## 2013-08-25 DIAGNOSIS — H8109 Meniere's disease, unspecified ear: Secondary | ICD-10-CM

## 2013-08-25 DIAGNOSIS — F17200 Nicotine dependence, unspecified, uncomplicated: Secondary | ICD-10-CM

## 2013-08-25 DIAGNOSIS — Z0000 Encounter for general adult medical examination without abnormal findings: Secondary | ICD-10-CM

## 2013-08-25 DIAGNOSIS — G43909 Migraine, unspecified, not intractable, without status migrainosus: Secondary | ICD-10-CM

## 2013-08-25 MED ORDER — CHLORTHALIDONE 25 MG PO TABS: ORAL_TABLET | ORAL | Status: DC

## 2013-08-25 NOTE — Progress Notes (Signed)
Pre visit review using our clinic review tool, if applicable. No additional management support is needed unless otherwise documented below in the visit note. 

## 2013-08-25 NOTE — Progress Notes (Signed)
Subjective:    Patient ID: Allison Jimenez, female    DOB: 04/22/1950, 63 y.o.   MRN: [id number]  HPI  63 year old patient who is in today for followup.  She has history of dyslipidemia, migraine headaches anxiety disorder, as well as Mnire's disease. She is still unemployed and has applied for disability due to her Mnire's.  This has been denied and she has obtained some legal assistance. She has a new therapist  and things are doing very well as far as her anxiety. She continues to use tobacco products.  Past Medical History  Diagnosis Date  . ANXIETY 06/05/2006  . BACK PAIN 01/05/2010  . DIVERTICULOSIS, COLON 06/05/2006  . DIZZINESS OR VERTIGO 06/05/2006  . HYPERLIPIDEMIA 06/05/2006  . HYPOKALEMIA 12/14/2008  . MENIERE'S DISEASE 06/25/2006  . TOBACCO USER 11/25/2007    History   Social History  . Marital Status: Married    Spouse Name: N/A    Number of Children: 1  . Years of Education: 13   Occupational History  .      Retired   Social History Main Topics  . Smoking status: Current Some Day Smoker  . Smokeless tobacco: Never Used     Comment: using electronic cigs.  . Alcohol Use: No  . Drug Use: No  . Sexual Activity: Not on file   Other Topics Concern  . Not on file   Social History Narrative   Patient lives at home alone. Patient has a Data processing manager.     Past Surgical History  Procedure Laterality Date  . Appendectomy    . Cholecystectomy    . Cosmetic surgery    . Tubal ligation    . Carpal tunnel release    . Vocal cord surgery      nodule  . Mastoid surgery      Family History  Problem Relation Age of Onset  . Migraines    . Depression    . Anxiety disorder    . High Cholesterol      Allergies  Allergen Reactions  . Erythromycin Base   . Penicillins     Current Outpatient Prescriptions on File Prior to Visit  Medication Sig Dispense Refill  . ALPRAZolam (XANAX) 0.25 MG tablet TAKE 1 TABLET BY MOUTH 3 TIMES A DAY  90 tablet  2    . atorvastatin (LIPITOR) 10 MG tablet Take 1 tablet (10 mg total) by mouth daily.  90 tablet  6  . chlorthalidone (HYGROTON) 25 MG tablet Take 1 tablet (25 mg total) by mouth daily.  90 tablet  3  . FLUoxetine (PROZAC) 20 MG capsule TAKE 1 CAPSULE EVERY DAY  90 capsule  1  . meloxicam (MOBIC) 7.5 MG tablet Take 7.5 mg by mouth daily as needed.      Marland Kitchen PATADAY 0.2 % SOLN       . potassium chloride 20 MEQ/15ML (10%) solution Take 15 mLs (20 mEq total) by mouth daily.  300 mL  6  . promethazine (PHENERGAN) 25 MG tablet TAKE 1 TABLET EVERY 8 HOURS AS NEEDED FOR NAUSEA  20 tablet  0  . rizatriptan (MAXALT) 5 MG tablet Take 5 mg by mouth as needed for migraine. May repeat in 2 hours if needed       No current facility-administered medications on file prior to visit.    BP 90/50  Pulse 79  Temp(Src) 98.1 F (36.7 C) (Oral)  Resp 18  Ht 5\' 1"  (1.549 m)  Wt  130 lb (58.968 kg)  BMI 24.58 kg/m2  SpO2 97%     Review of Systems  Constitutional: Negative.   HENT: Negative for congestion, dental problem, hearing loss, rhinorrhea, sinus pressure, sore throat and tinnitus.   Eyes: Negative for pain, discharge and visual disturbance.  Respiratory: Negative for cough and shortness of breath.   Cardiovascular: Negative for chest pain, palpitations and leg swelling.  Gastrointestinal: Negative for nausea, vomiting, abdominal pain, diarrhea, constipation, blood in stool and abdominal distention.  Genitourinary: Negative for dysuria, urgency, frequency, hematuria, flank pain, vaginal bleeding, vaginal discharge, difficulty urinating, vaginal pain and pelvic pain.  Musculoskeletal: Negative for arthralgias, gait problem and joint swelling.  Skin: Negative for rash.  Neurological: Positive for light-headedness. Negative for dizziness, syncope, speech difficulty, weakness, numbness and headaches.  Hematological: Negative for adenopathy.  Psychiatric/Behavioral: Negative for behavioral problems, dysphoric  mood and agitation. The patient is not nervous/anxious.        Objective:   Physical Exam  Constitutional: She is oriented to person, place, and time. She appears well-developed and well-nourished.  Blood pressure arrival, 90 over 50 Repeat blood pressure 118/60  HENT:  Head: Normocephalic.  Right Ear: External ear normal.  Left Ear: External ear normal.  Mouth/Throat: Oropharynx is clear and moist.  Eyes: Conjunctivae and EOM are normal. Pupils are equal, round, and reactive to light.  Neck: Normal range of motion. Neck supple. No thyromegaly present.  Cardiovascular: Normal rate, regular rhythm, normal heart sounds and intact distal pulses.   Pulmonary/Chest: Effort normal and breath sounds normal.  Abdominal: Soft. Bowel sounds are normal. She exhibits no mass. There is no tenderness.  Musculoskeletal: Normal range of motion.  Lymphadenopathy:    She has no cervical adenopathy.  Neurological: She is alert and oriented to person, place, and time.  Nonfocal Finger to nose testing normal Heel to shin testing normal Romberg normal  Skin: Skin is warm and dry. No rash noted.  Psychiatric: She has a normal mood and affect. Her behavior is normal.          Assessment & Plan:   Mnire's disease, ongoing tobacco use, dyslipidemia.  Continue atorvastatin Schedule CPX  Anxiety disorder.  Followup therapist  Hypotension.  We'll decrease chlorthalidone to 12 point 5 mg daily   Total smoking  cessation encouraged

## 2013-08-25 NOTE — Patient Instructions (Signed)
Return in 4 months for follow-up    It is important that you exercise regularly, at least 20 minutes 3 to 4 times per week.  If you develop chest pain or shortness of breath seek  medical attention.

## 2013-09-01 LAB — HM MAMMOGRAPHY

## 2013-09-03 ENCOUNTER — Telehealth: Payer: Self-pay | Admitting: Gastroenterology

## 2013-09-03 NOTE — Telephone Encounter (Signed)
Please advise 

## 2013-09-04 ENCOUNTER — Other Ambulatory Visit: Payer: Self-pay

## 2013-09-04 DIAGNOSIS — Z1211 Encounter for screening for malignant neoplasm of colon: Secondary | ICD-10-CM

## 2013-09-04 NOTE — Telephone Encounter (Signed)
Appointments scheduled for pre visit 9/11 and colon 11/11. Referral done. Encouraged the patient to call her insurance also.

## 2013-09-04 NOTE — Telephone Encounter (Signed)
No problem.

## 2013-09-17 ENCOUNTER — Other Ambulatory Visit (INDEPENDENT_AMBULATORY_CARE_PROVIDER_SITE_OTHER): Payer: Managed Care, Other (non HMO)

## 2013-09-17 DIAGNOSIS — Z0000 Encounter for general adult medical examination without abnormal findings: Secondary | ICD-10-CM

## 2013-09-17 LAB — HEPATIC FUNCTION PANEL
ALK PHOS: 43 U/L (ref 39–117)
ALT: 16 U/L (ref 0–35)
AST: 26 U/L (ref 0–37)
Albumin: 4.3 g/dL (ref 3.5–5.2)
BILIRUBIN DIRECT: 0 mg/dL (ref 0.0–0.3)
BILIRUBIN TOTAL: 0.2 mg/dL (ref 0.2–1.2)
Total Protein: 7.8 g/dL (ref 6.0–8.3)

## 2013-09-17 LAB — BASIC METABOLIC PANEL
BUN: 20 mg/dL (ref 6–23)
CALCIUM: 10.3 mg/dL (ref 8.4–10.5)
CHLORIDE: 96 meq/L (ref 96–112)
CO2: 31 meq/L (ref 19–32)
CREATININE: 0.9 mg/dL (ref 0.4–1.2)
GFR: 69.21 mL/min (ref >60.00)
Glucose, Bld: 99 mg/dL (ref 70–99)
Potassium: 4.3 mEq/L (ref 3.5–5.1)
Sodium: 136 mEq/L (ref 135–145)

## 2013-09-17 LAB — CBC WITH DIFFERENTIAL/PLATELET
Basophils Absolute: 0 10*3/uL (ref 0.0–0.1)
Basophils Relative: 0.5 % (ref 0.0–3.0)
EOS ABS: 0.3 10*3/uL (ref 0.0–0.7)
Eosinophils Relative: 4.6 % (ref 0.0–5.0)
HCT: 43.4 % (ref 36.0–46.0)
Hemoglobin: 14.6 g/dL (ref 12.0–15.0)
LYMPHS PCT: 33.2 % (ref 12.0–46.0)
Lymphs Abs: 2.5 10*3/uL (ref 0.7–4.0)
MCHC: 33.7 g/dL (ref 30.0–36.0)
MCV: 88.4 fl (ref 78.0–100.0)
Monocytes Absolute: 0.6 10*3/uL (ref 0.1–1.0)
Monocytes Relative: 8.2 % (ref 3.0–12.0)
NEUTROS PCT: 53.5 % (ref 43.0–77.0)
Neutro Abs: 4 10*3/uL (ref 1.4–7.7)
Platelets: 305 10*3/uL (ref 150.0–400.0)
RBC: 4.91 Mil/uL (ref 3.87–5.11)
RDW: 13.9 % (ref 11.5–15.5)
WBC: 7.6 10*3/uL (ref 4.0–10.5)

## 2013-09-17 LAB — LIPID PANEL
CHOLESTEROL: 245 mg/dL — AB (ref 0–200)
HDL: 48.2 mg/dL (ref >39.00)
LDL Cholesterol: 174 mg/dL — ABNORMAL HIGH (ref 0–99)
NonHDL: 196.8
TRIGLYCERIDES: 112 mg/dL (ref 0.0–149.0)
Total CHOL/HDL Ratio: 5
VLDL: 22.4 mg/dL (ref 0.0–40.0)

## 2013-09-17 LAB — POCT URINALYSIS DIPSTICK
BILIRUBIN UA: NEGATIVE
Glucose, UA: NEGATIVE
KETONES UA: NEGATIVE
LEUKOCYTES UA: NEGATIVE
Nitrite, UA: NEGATIVE
PROTEIN UA: NEGATIVE
Spec Grav, UA: 1.01
Urobilinogen, UA: 0.2
pH, UA: 6

## 2013-09-17 LAB — TSH: TSH: 2.31 u[IU]/mL (ref 0.35–4.50)

## 2013-09-18 ENCOUNTER — Other Ambulatory Visit: Payer: Self-pay

## 2013-09-23 ENCOUNTER — Encounter: Payer: Self-pay | Admitting: Internal Medicine

## 2013-09-23 ENCOUNTER — Ambulatory Visit (INDEPENDENT_AMBULATORY_CARE_PROVIDER_SITE_OTHER): Payer: Managed Care, Other (non HMO) | Admitting: Internal Medicine

## 2013-09-23 VITALS — BP 104/58 | HR 71 | Temp 98.0°F | Resp 18 | Ht 60.75 in | Wt 130.0 lb

## 2013-09-23 DIAGNOSIS — E785 Hyperlipidemia, unspecified: Secondary | ICD-10-CM

## 2013-09-23 DIAGNOSIS — F17200 Nicotine dependence, unspecified, uncomplicated: Secondary | ICD-10-CM

## 2013-09-23 DIAGNOSIS — Z0000 Encounter for general adult medical examination without abnormal findings: Secondary | ICD-10-CM

## 2013-09-23 DIAGNOSIS — Z23 Encounter for immunization: Secondary | ICD-10-CM

## 2013-09-23 DIAGNOSIS — F411 Generalized anxiety disorder: Secondary | ICD-10-CM

## 2013-09-23 DIAGNOSIS — G43909 Migraine, unspecified, not intractable, without status migrainosus: Secondary | ICD-10-CM

## 2013-09-23 DIAGNOSIS — H8109 Meniere's disease, unspecified ear: Secondary | ICD-10-CM

## 2013-09-23 MED ORDER — CHLORTHALIDONE 25 MG PO TABS: ORAL_TABLET | ORAL | Status: DC

## 2013-09-23 MED ORDER — CHLORTHALIDONE 25 MG PO TABS: 25.0000 mg | ORAL_TABLET | Freq: Every day | ORAL | Status: DC

## 2013-09-23 NOTE — Progress Notes (Signed)
Patient ID: Allison Jimenez, female   DOB: February 17, 1950, 63 y.o.   MRN: [id number]  Subjective:    Patient ID: Allison Jimenez, female    DOB: Jun 27, 1950, 63 y.o.   MRN: [id number]  HPI 63year-old patient who is seen today for an annual exam.  She has a history of tobacco use but has decreased her tobacco consumption. She is followed  by GYN and is scheduled for followup in November 2015.  She also has a GI followup scheduled in November.  She is approaching her 10 year anniversary of her last colonoscopy. She does get annual eye exams. She has a history of anxiety disorder which has been stable. She is on Lipitor for mild dyslipidemia. Laboratory studies reviewed. No new concerns or complaints.   She is on diuretic therapy for Mnire's disease. She has been followed by ENT.   Allergies:  1) ! Penicillin V Potassium (Penicillin V Potassium)  2) ! Erythromycin Base (Erythromycin Base)   Past History:  Past Medical History:   Anxiety  Diverticulosis, colon  Dizziness or vertigo  Hyperlipidemia  Mnire's disease  tobacco abuse  impaired glucose tolerance   Past Surgical History:   Appendectomy 1996  Caesarean section gravida one, para one, abortus one  Cholecystectomy laparoscopic 1997  Tubal ligation  Mastoid surgery  status post laparoscopy for ovarian cyst  surgery for vocal cord nodule  status post carpal tunnel release  colonoscopy January 2006   Family History:   mother died age 60, with a history of bilateral breast cancer and cerebrovascular disease  (10/12) Father age 5  One sister in good health  A number of aunts and uncles with the diabetes   Social History:   Divorced  one adult son      Review of Systems  Constitutional: Negative for fever, appetite change, fatigue and unexpected weight change.  HENT: Negative for congestion, dental problem, ear pain, hearing loss, mouth sores, nosebleeds, sinus pressure, sore throat, tinnitus, trouble swallowing and voice  change.   Eyes: Negative for photophobia, pain, redness and visual disturbance.  Respiratory: Positive for cough. Negative for chest tightness and shortness of breath.   Cardiovascular: Negative for chest pain, palpitations and leg swelling.  Gastrointestinal: Negative for nausea, vomiting, abdominal pain, diarrhea, constipation, blood in stool, abdominal distention and rectal pain.  Genitourinary: Negative for dysuria, urgency, frequency, hematuria, flank pain, vaginal bleeding, vaginal discharge, difficulty urinating, genital sores, vaginal pain, menstrual problem and pelvic pain.  Musculoskeletal: Negative for arthralgias, back pain and neck stiffness.  Skin: Negative for rash.  Neurological: Negative for dizziness, syncope, speech difficulty, weakness, light-headedness, numbness and headaches.  Hematological: Negative for adenopathy. Does not bruise/bleed easily.  Psychiatric/Behavioral: Negative for suicidal ideas, behavioral problems, self-injury, dysphoric mood and agitation. The patient is not nervous/anxious.        Objective:   Physical Exam  Constitutional: She is oriented to person, place, and time. She appears well-developed and well-nourished.  HENT:  Head: Normocephalic and atraumatic.  Right Ear: External ear normal.  Left Ear: External ear normal.  Mouth/Throat: Oropharynx is clear and moist.  Eyes: Conjunctivae and EOM are normal.  Neck: Normal range of motion. Neck supple. No JVD present. No thyromegaly present.  Cardiovascular: Normal rate, regular rhythm, normal heart sounds and intact distal pulses.   No murmur heard. Pulmonary/Chest: Effort normal. She has no wheezes. She has no rales.  Abdominal: Soft. Bowel sounds are normal. She exhibits no distension and no mass. There is no tenderness. There  is no rebound and no guarding.  Musculoskeletal: Normal range of motion. She exhibits no edema and no tenderness.  Neurological: She is alert and oriented to person,  place, and time. She has normal reflexes. No cranial nerve deficit. She exhibits normal muscle tone. Coordination normal.  Skin: Skin is warm and dry. No rash noted.  Psychiatric: She has a normal mood and affect. Her behavior is normal.          Assessment & Plan:    Annual health examination  Tobacco use. improved Dyslipidemia.  New guidelines for cholesterol management discussed at length.  Presently, she does not take Lipitor on a regular basis  Total tobacco use encouraged. Calcium and vitamin D supplements encouraged GYN and GI followup as scheduled  Return here in one year or as needed Regular  exercise encouraged

## 2013-09-23 NOTE — Patient Instructions (Signed)
It is important that you exercise regularly, at least 20 minutes 3 to 4 times per week.  If you develop chest pain or shortness of breath seek  medical attention.  Take a calcium supplement, plus [phone] units of vitamin D  Smoking tobacco is very bad for your health. You should stop smoking immediately.  Health Maintenance Adopting a healthy lifestyle and getting preventive care can go a long way to promote health and wellness. Talk with your health care provider about what schedule of regular examinations is right for you. This is a good chance for you to check in with your provider about disease prevention and staying healthy. In between checkups, there are plenty of things you can do on your own. Experts have done a lot of research about which lifestyle changes and preventive measures are most likely to keep you healthy. Ask your health care provider for more information. WEIGHT AND DIET  Eat a healthy diet  Be sure to include plenty of vegetables, fruits, low-fat dairy products, and lean protein.  Do not eat a lot of foods high in solid fats, added sugars, or salt.  Get regular exercise. This is one of the most important things you can do for your health.  Most adults should exercise for at least 150 minutes each week. The exercise should increase your heart rate and make you sweat (moderate-intensity exercise).  Most adults should also do strengthening exercises at least twice a week. This is in addition to the moderate-intensity exercise.  Maintain a healthy weight  Body mass index (BMI) is a measurement that can be used to identify possible weight problems. It estimates body fat based on height and weight. Your health care provider can help determine your BMI and help you achieve or maintain a healthy weight.  For females 3 years of age and older:   A BMI below 18.5 is considered underweight.  A BMI of 18.5 to 24.9 is normal.  A BMI of 25 to 29.9 is considered  overweight.  A BMI of 30 and above is considered obese.  Watch levels of cholesterol and blood lipids  You should start having your blood tested for lipids and cholesterol at 62 years of age, then have this test every 5 years.  You may need to have your cholesterol levels checked more often if:  Your lipid or cholesterol levels are high.  You are older than 62 years of age.  You are at high risk for heart disease.  CANCER SCREENING   Lung Cancer  Lung cancer screening is recommended for adults 65-3 years old who are at high risk for lung cancer because of a history of smoking.  A yearly low-dose CT scan of the lungs is recommended for people who:  Currently smoke.  Have quit within the past 15 years.  Have at least a 30-pack-year history of smoking. A pack year is smoking an average of one pack of cigarettes a day for 1 year.  Yearly screening should continue until it has been 15 years since you quit.  Yearly screening should stop if you develop a health problem that would prevent you from having lung cancer treatment.  Breast Cancer  Practice breast self-awareness. This means understanding how your breasts normally appear and feel.  It also means doing regular breast self-exams. Let your health care provider know about any changes, no matter how small.  If you are in your 20s or 30s, you should have a clinical breast exam (CBE) by a  health care provider every 1-3 years as part of a regular health exam.  If you are 26 or older, have a CBE every year. Also consider having a breast X-ray (mammogram) every year.  If you have a family history of breast cancer, talk to your health care provider about genetic screening.  If you are at high risk for breast cancer, talk to your health care provider about having an MRI and a mammogram every year.  Breast cancer gene (BRCA) assessment is recommended for women who have family members with BRCA-related cancers. BRCA-related  cancers include:  Breast.  Ovarian.  Tubal.  Peritoneal cancers.  Results of the assessment will determine the need for genetic counseling and BRCA1 and BRCA2 testing. Cervical Cancer Routine pelvic examinations to screen for cervical cancer are no longer recommended for nonpregnant women who are considered low risk for cancer of the pelvic organs (ovaries, uterus, and vagina) and who do not have symptoms. A pelvic examination may be necessary if you have symptoms including those associated with pelvic infections. Ask your health care provider if a screening pelvic exam is right for you.   The Pap test is the screening test for cervical cancer for women who are considered at risk.  If you had a hysterectomy for a problem that was not cancer or a condition that could lead to cancer, then you no longer need Pap tests.  If you are older than 65 years, and you have had normal Pap tests for the past 10 years, you no longer need to have Pap tests.  If you have had past treatment for cervical cancer or a condition that could lead to cancer, you need Pap tests and screening for cancer for at least 20 years after your treatment.  If you no longer get a Pap test, assess your risk factors if they change (such as having a new sexual partner). This can affect whether you should start being screened again.  Some women have medical problems that increase their chance of getting cervical cancer. If this is the case for you, your health care provider may recommend more frequent screening and Pap tests.  The human papillomavirus (HPV) test is another test that may be used for cervical cancer screening. The HPV test looks for the virus that can cause cell changes in the cervix. The cells collected during the Pap test can be tested for HPV.  The HPV test can be used to screen women 22 years of age and older. Getting tested for HPV can extend the interval between normal Pap tests from three to five  years.  An HPV test also should be used to screen women of any age who have unclear Pap test results.  After 62 years of age, women should have HPV testing as often as Pap tests.  Colorectal Cancer  This type of cancer can be detected and often prevented.  Routine colorectal cancer screening usually begins at 62 years of age and continues through 62 years of age.  Your health care provider may recommend screening at an earlier age if you have risk factors for colon cancer.  Your health care provider may also recommend using home test kits to check for hidden blood in the stool.  A small camera at the end of a tube can be used to examine your colon directly (sigmoidoscopy or colonoscopy). This is done to check for the earliest forms of colorectal cancer.  Routine screening usually begins at age 60.  Direct examination of  colon should be repeated every 5-10 years through 62 years of age. However, you may need to be screened more often if early forms of precancerous polyps or small growths are found. Skin Cancer  Check your skin from head to toe regularly.  Tell your health care provider about any new moles or changes in moles, especially if there is a change in a mole's shape or color.  Also tell your health care provider if you have a mole that is larger than the size of a pencil eraser.  Always use sunscreen. Apply sunscreen liberally and repeatedly throughout the day.  Protect yourself by wearing long sleeves, pants, a wide-brimmed hat, and sunglasses whenever you are outside. HEART DISEASE, DIABETES, AND HIGH BLOOD PRESSURE   Have your blood pressure checked at least every 1-2 years. High blood pressure causes heart disease and increases the risk of stroke.  If you are between 55 years and 79 years old, ask your health care provider if you should take aspirin to prevent strokes.  Have regular diabetes screenings. This involves taking a blood sample to check your fasting  blood sugar level.  If you are at a normal weight and have a low risk for diabetes, have this test once every three years after 62 years of age.  If you are overweight and have a high risk for diabetes, consider being tested at a younger age or more often. PREVENTING INFECTION  Hepatitis B  If you have a higher risk for hepatitis B, you should be screened for this virus. You are considered at high risk for hepatitis B if:  You were born in a country where hepatitis B is common. Ask your health care provider which countries are considered high risk.  Your parents were born in a high-risk country, and you have not been immunized against hepatitis B (hepatitis B vaccine).  You have HIV or AIDS.  You use needles to inject street drugs.  You live with someone who has hepatitis B.  You have had sex with someone who has hepatitis B.  You get hemodialysis treatment.  You take certain medicines for conditions, including cancer, organ transplantation, and autoimmune conditions. Hepatitis C  Blood testing is recommended for:  Everyone born from 1945 through 1965.  Anyone with known risk factors for hepatitis C. Sexually transmitted infections (STIs)  You should be screened for sexually transmitted infections (STIs) including gonorrhea and chlamydia if:  You are sexually active and are younger than 62 years of age.  You are older than 62 years of age and your health care provider tells you that you are at risk for this type of infection.  Your sexual activity has changed since you were last screened and you are at an increased risk for chlamydia or gonorrhea. Ask your health care provider if you are at risk.  If you do not have HIV, but are at risk, it may be recommended that you take a prescription medicine daily to prevent HIV infection. This is called pre-exposure prophylaxis (PrEP). You are considered at risk if:  You are sexually active and do not regularly use condoms or know  the HIV status of your partner(s).  You take drugs by injection.  You are sexually active with a partner who has HIV. Talk with your health care provider about whether you are at high risk of being infected with HIV. If you choose to begin PrEP, you should first be tested for HIV. You should then be tested every 3 months   months for as long as you are taking PrEP.  PREGNANCY   If you are premenopausal and you may become pregnant, ask your health care provider about preconception counseling.  If you may become pregnant, take 400 to 800 micrograms (mcg) of folic acid every day.  If you want to prevent pregnancy, talk to your health care provider about birth control (contraception). OSTEOPOROSIS AND MENOPAUSE   Osteoporosis is a disease in which the bones lose minerals and strength with aging. This can result in serious bone fractures. Your risk for osteoporosis can be identified using a bone density scan.  If you are 21 years of age or older, or if you are at risk for osteoporosis and fractures, ask your health care provider if you should be screened.  Ask your health care provider whether you should take a calcium or vitamin D supplement to lower your risk for osteoporosis.  Menopause may have certain physical symptoms and risks.  Hormone replacement therapy may reduce some of these symptoms and risks. Talk to your health care provider about whether hormone replacement therapy is right for you.  HOME CARE INSTRUCTIONS   Schedule regular health, dental, and eye exams.  Stay current with your immunizations.   Do not use any tobacco products including cigarettes, chewing tobacco, or electronic cigarettes.  If you are pregnant, do not drink alcohol.  If you are breastfeeding, limit how much and how often you drink alcohol.  Limit alcohol intake to no more than 1 drink per day for nonpregnant women. One drink equals 12 ounces of beer, 5 ounces of wine, or 1 ounces of hard liquor.  Do not  use street drugs.  Do not share needles.  Ask your health care provider for help if you need support or information about quitting drugs.  Tell your health care provider if you often feel depressed.  Tell your health care provider if you have ever been abused or do not feel safe at home. Document Released: 07/03/2010 Document Revised: 05/04/2013 Document Reviewed: 11/19/2012 [facility] Patient Information 2015 [place], [place]. This information is not intended to replace advice given to you by your health care provider. Make sure you discuss any questions you have with your health care provider. Cardiac Diet This diet can help prevent heart disease and stroke. Many factors influence your heart health, including eating and exercise habits. Coronary risk rises a lot with abnormal blood fat (lipid) levels. Cardiac meal planning includes limiting unhealthy fats, increasing healthy fats, and making other small dietary changes. General guidelines are as follows:  Adjust calorie intake to reach and maintain desirable body weight.  Limit total fat intake to less than 30% of total calories. Saturated fat should be less than 7% of calories.  Saturated fats are found in animal products and in some vegetable products. Saturated vegetable fats are found in coconut oil, cocoa butter, palm oil, and palm kernel oil. Read labels carefully to avoid these products as much as possible. Use butter in moderation. Choose tub margarines and oils that have 2 grams of fat or less. Good cooking oils are canola and olive oils.  Practice low-fat cooking techniques. Do not fry food. Instead, broil, bake, boil, steam, grill, roast on a rack, stir-fry, or microwave it. Other fat reducing suggestions include:  Remove the skin from poultry.  Remove all visible fat from meats.  Skim the fat off stews, soups, and gravies before serving them.  Steam vegetables in water or broth instead of sauting them in fat.  Avoid foods  with trans fat (or hydrogenated oils), such as commercially fried foods and commercially baked goods. Commercial shortening and deep-frying fats will contain trans fat.  Increase intake of fruits, vegetables, whole grains, and legumes to replace foods high in fat.  Increase consumption of nuts, legumes, and seeds to at least 4 servings weekly. One serving of a legume equals  cup, and 1 serving of nuts or seeds equals  cup.  Choose whole grains more often. Have 3 servings per day (a serving is 1 ounce [oz]).  Eat 4 to 5 servings of vegetables per day. A serving of vegetables is 1 cup of raw leafy vegetables;  cup of raw or cooked cut-up vegetables;  cup of vegetable juice.  Eat 4 to 5 servings of fruit per day. A serving of fruit is 1 medium whole fruit;  cup of dried fruit;  cup of fresh, frozen, or canned fruit;  cup of 100% fruit juice.  Increase your intake of dietary fiber to 20 to 30 grams per day. Insoluble fiber may help lower your risk of heart disease and may help curb your appetite.  Soluble fiber binds cholesterol to be removed from the blood. Foods high in soluble fiber are dried beans, citrus fruits, oats, apples, bananas, broccoli, Brussels sprouts, and eggplant.  Try to include foods fortified with plant sterols or stanols, such as yogurt, breads, juices, or margarines. Choose several fortified foods to achieve a daily intake of 2 to 3 grams of plant sterols or stanols.  Foods with omega-3 fats can help reduce your risk of heart disease. Aim to have a 3.5 oz portion of fatty fish twice per week, such as salmon, mackerel, albacore tuna, sardines, lake trout, or herring. If you wish to take a fish oil supplement, choose one that contains 1 gram of both DHA and EPA.  Limit processed meats to 2 servings (3 oz portion) weekly.  Limit the sodium in your diet to 1500 milligrams (mg) per day. If you have high blood pressure, talk to a registered dietitian about a DASH (Dietary  Approaches to Stop Hypertension) eating plan.  Limit sweets and beverages with added sugar, such as soda, to no more than 5 servings per week. One serving is:   1 tablespoon sugar.  1 tablespoon jelly or jam.   cup sorbet.  1 cup lemonade.   cup regular soda. CHOOSING FOODS Starches  Allowed: Breads: All kinds (wheat, rye, raisin, white, oatmeal, [place], [place], and English muffin bread). Low-fat rolls: English muffins, frankfurter and hamburger buns, bagels, pita bread, tortillas (not fried). Pancakes, waffles, biscuits, and muffins made with recommended oil.  Avoid: Products made with saturated or trans fats, oils, or whole milk products. Butter rolls, cheese breads, croissants. Commercial doughnuts, muffins, sweet rolls, biscuits, waffles, pancakes, store-bought mixes. Crackers  Allowed: Low-fat crackers and snacks: Animal, graham, rye, saltine (with recommended oil, no lard), oyster, and matzo crackers. Bread sticks, melba toast, rusks, flatbread, pretzels, and light popcorn.  Avoid: High-fat crackers: cheese crackers, butter crackers, and those made with coconut, palm oil, or trans fat (hydrogenated oils). Buttered popcorn. Cereals  Allowed: Hot or cold whole-grain cereals.  Avoid: Cereals containing coconut, hydrogenated vegetable fat, or animal fat. Potatoes / Pasta / Rice  Allowed: All kinds of potatoes, rice, and pasta (such as macaroni, spaghetti, and noodles).  Avoid: Pasta or rice prepared with cream sauce or high-fat cheese. Chow mein noodles, [place] fries. Vegetables  Allowed: All vegetables and vegetable juices.  Avoid: Fried vegetables.  Vegetables in cream, butter, or high-fat cheese sauces. Limit coconut. Fruit in cream or custard. Protein  Allowed: Limit your intake of meat, seafood, and poultry to no more than 6 oz (cooked weight) per day. All lean, well-trimmed beef, veal, pork, and lamb. All chicken and [place] without skin. All fish and shellfish.  Wild game: wild duck, rabbit, pheasant, and venison. Egg whites or low-cholesterol egg substitutes may be used as desired. Meatless dishes: recipes with dried beans, peas, lentils, and tofu (soybean curd). Seeds and nuts: all seeds and most nuts.  Avoid: Prime grade and other heavily marbled and fatty meats, such as short ribs, spare ribs, rib eye roast or steak, frankfurters, sausage, bacon, and high-fat luncheon meats, mutton. Caviar. Commercially fried fish. Domestic duck, goose, venison sausage. Organ meats: liver, gizzard, heart, chitterlings, brains, kidney, sweetbreads. Dairy  Allowed: Low-fat cheeses: nonfat or low-fat cottage cheese (1% or 2% fat), cheeses made with part skim milk, such as mozzarella, farmers, string, or ricotta. (Cheeses should be labeled no more than 2 to 6 grams fat per oz.). Skim (or 1%) milk: liquid, powdered, or evaporated. Buttermilk made with low-fat milk. Drinks made with skim or low-fat milk or cocoa. Chocolate milk or cocoa made with skim or low-fat (1%) milk. Nonfat or low-fat yogurt.  Avoid: Whole milk cheeses, including colby, cheddar, mu[place], Monterey Jack, [place], [place], [place], American, Swiss, and blue. Creamed cottage cheese, cream cheese. Whole milk and whole milk products, including buttermilk or yogurt made from whole milk, drinks made from whole milk. Condensed milk, evaporated whole milk, and 2% milk. Soups and Combination Foods  Allowed: Low-fat low-sodium soups: broth, dehydrated soups, homemade broth, soups with the fat removed, homemade cream soups made with skim or low-fat milk. Low-fat spaghetti, lasagna, chili, and Spanish rice if low-fat ingredients and low-fat cooking techniques are used.  Avoid: Cream soups made with whole milk, cream, or high-fat cheese. All other soups. Desserts and Sweets  Allowed: Sherbet, fruit ices, gelatins, meringues, and angel food cake. Homemade desserts with recommended fats, oils, and milk products. Jam,  jelly, honey, marmalade, sugars, and syrups. Pure sugar candy, such as gum drops, hard candy, jelly beans, marshmallows, mints, and small amounts of dark chocolate.  Avoid: Commercially prepared cakes, pies, cookies, frosting, pudding, or mixes for these products. Desserts containing whole milk products, chocolate, coconut, lard, palm oil, or palm kernel oil. Ice cream or ice cream drinks. Candy that contains chocolate, coconut, butter, hydrogenated fat, or unknown ingredients. Buttered syrups. Fats and Oils  Allowed: Vegetable oils: safflower, sunflower, corn, soybean, cottonseed, sesame, canola, olive, or peanut. Non-hydrogenated margarines. Salad dressing or mayonnaise: homemade or commercial, made with a recommended oil. Low or nonfat salad dressing or mayonnaise.  Limit added fats and oils to 6 to 8 tsp per day (includes fats used in cooking, baking, salads, and spreads on bread). Remember to count the "hidden fats" in foods.  Avoid: Solid fats and shortenings: butter, lard, salt pork, bacon drippings. Gravy containing meat fat, shortening, or suet. Cocoa butter, coconut. Coconut oil, palm oil, palm kernel oil, or hydrogenated oils: these ingredients are often used in bakery products, nondairy creamers, whipped toppings, candy, and commercially fried foods. Read labels carefully. Salad dressings made of unknown oils, sour cream, or cheese, such as blue cheese and Roquefort. Cream, all kinds: half-and-half, light, heavy, or whipping. Sour cream or cream cheese (even if "light" or low-fat). Nondairy cream substitutes: coffee creamers and sour cream substitutes made with palm, palm kernel, hydrogenated oils, or coconut  oil. Beverages  Allowed: Coffee (regular or decaffeinated), tea. Diet carbonated beverages, mineral water. Alcohol: Check with your caregiver. Moderation is recommended.  Avoid: Whole milk, regular sodas, and juice drinks with added sugar. Condiments  Allowed: All seasonings and  condiments. Cocoa powder. "Cream" sauces made with recommended ingredients.  Avoid: Carob powder made with hydrogenated fats. SAMPLE MENU Breakfast   cup orange juice   cup oatmeal  1 slice toast  1 tsp margarine  1 cup skim milk Lunch  [place] sandwich with 2 oz [place], 2 slices bread  Lettuce and tomato slices  Fresh fruit  Carrot sticks  Coffee or tea Snack  Fresh fruit or low-fat crackers Dinner  3 oz lean ground beef  1 baked potato  1 tsp margarine   cup asparagus  Lettuce salad  1 tbs non-creamy dressing   cup peach slices  1 cup skim milk Document Released: 09/27/2007 Document Revised: 06/19/2011 Document Reviewed: 02/17/2013 [employer] Patient Information 2015 [place], [place]. This information is not intended to replace advice given to you by your health care provider. Make sure you discuss any questions you have with your health care provider.

## 2013-09-23 NOTE — Progress Notes (Signed)
Pre visit review using our clinic review tool, if applicable. No additional management support is needed unless otherwise documented below in the visit note. 

## 2013-10-26 ENCOUNTER — Other Ambulatory Visit: Payer: Self-pay

## 2013-10-28 ENCOUNTER — Telehealth: Payer: Self-pay | Admitting: Internal Medicine

## 2013-10-28 MED ORDER — ZOSTER VACCINE LIVE 19400 UNT/0.65ML ~~LOC~~ SOLR: 0.6500 mL | Freq: Once | SUBCUTANEOUS | Status: DC

## 2013-10-28 NOTE — Telephone Encounter (Signed)
Pt notified Rx for Shingles vaccine sent to pharmacy. 

## 2013-10-28 NOTE — Telephone Encounter (Signed)
Pt needs rx for shingle vaccine  fax to [phone] [facility] [address] in [place]

## 2013-10-29 ENCOUNTER — Ambulatory Visit (AMBULATORY_SURGERY_CENTER): Payer: Self-pay | Admitting: *Deleted

## 2013-10-29 VITALS — Ht 60.5 in | Wt 130.4 lb

## 2013-10-29 DIAGNOSIS — Z1211 Encounter for screening for malignant neoplasm of colon: Secondary | ICD-10-CM

## 2013-10-29 NOTE — Progress Notes (Signed)
No home 02 use. ewm No egg or soy allergy. ewm No issues with past sedation...ether made pt N/V. ewm No diet pills. ewm

## 2013-11-02 ENCOUNTER — Encounter: Payer: Self-pay | Admitting: Internal Medicine

## 2013-11-03 ENCOUNTER — Telehealth: Payer: Self-pay | Admitting: Internal Medicine

## 2013-11-03 NOTE — Telephone Encounter (Signed)
[id number] - [place], [place] [address] is requesting re-fill on ALPRAZolam (XANAX) 0.25 MG tablet

## 2013-11-04 ENCOUNTER — Other Ambulatory Visit: Payer: Self-pay | Admitting: Internal Medicine

## 2013-11-04 MED ORDER — ALPRAZOLAM 0.25 MG PO TABS: ORAL_TABLET | ORAL | Status: DC

## 2013-11-04 NOTE — Telephone Encounter (Signed)
Rx called in to pharmacy. 

## 2013-11-05 ENCOUNTER — Other Ambulatory Visit: Payer: Self-pay | Admitting: Obstetrics and Gynecology

## 2013-11-09 LAB — CYTOLOGY - PAP

## 2013-11-11 ENCOUNTER — Encounter: Payer: Self-pay | Admitting: Gastroenterology

## 2013-11-14 ENCOUNTER — Other Ambulatory Visit: Payer: Self-pay | Admitting: Internal Medicine

## 2013-11-19 ENCOUNTER — Encounter: Payer: Self-pay | Admitting: Gastroenterology

## 2013-12-16 ENCOUNTER — Encounter: Payer: Self-pay | Admitting: Gastroenterology

## 2014-01-15 ENCOUNTER — Other Ambulatory Visit: Payer: Self-pay | Admitting: Internal Medicine

## 2014-02-16 ENCOUNTER — Other Ambulatory Visit: Payer: Self-pay | Admitting: Internal Medicine

## 2014-03-29 ENCOUNTER — Telehealth: Payer: Self-pay | Admitting: Internal Medicine

## 2014-03-29 DIAGNOSIS — R319 Hematuria, unspecified: Secondary | ICD-10-CM

## 2014-03-29 NOTE — Telephone Encounter (Addendum)
Pt went to [facility] in [place] and dx with uti. Pt would like a referral to dr hanspal urologist in [place] for blood in urine. [phone] fax [phone]. Pt has [facility] id #[id number]

## 2014-03-29 NOTE — Telephone Encounter (Signed)
Spoke to pt, told her order for referral to Urology was done and someone will contact you regarding an appointment. Pt verbalized understanding.

## 2014-03-29 NOTE — Telephone Encounter (Signed)
Please advise if okay to send referral for Urology?

## 2014-03-29 NOTE — Telephone Encounter (Signed)
Okay for urological referral 

## 2014-03-30 ENCOUNTER — Telehealth: Payer: Self-pay | Admitting: Internal Medicine

## 2014-03-30 NOTE — Telephone Encounter (Signed)
Confirmation Thank you for your online Referral submission. Your referral case information was transmitted on 03/30/2014 at 08:59 AM CDT Your Referral Number is [id number]

## 2014-04-06 ENCOUNTER — Telehealth: Payer: Self-pay | Admitting: Internal Medicine

## 2014-04-06 NOTE — Telephone Encounter (Signed)
Left message on voicemail to call office.  

## 2014-04-06 NOTE — Telephone Encounter (Signed)
No; suggest patient check with the local medical Society

## 2014-04-06 NOTE — Telephone Encounter (Signed)
Patient Name: Allison Jimenez  DOB: Jul 18, 1950    Initial Comment Caller states for the last 2 weeks, started with leiner's disease, then got a virus, this am woke up with hot flash, anxiety, tingling over body    Nurse Assessment  Nurse: Leilani Merl, RN, Heather Date/Time (Eastern Time): 04/06/2014 9:19:13 AM  Confirm and document reason for call. If symptomatic, describe symptoms. ---Caller states for the last 2 weeks, started with meniere's disease, then got a virus, she was seen at urgent care, then referred to a urologist and she had a CT and a scope of her bladder, she has been having issues with anxiety since she has been sick the last 2 weeks this am woke up with hot flash, anxiety, tingling over body  Has the patient traveled out of the country within the last 30 days? ---Not Applicable  Does the patient require triage? ---Yes  Related visit to physician within the last 2 weeks? ---No  Does the PT have any chronic conditions? (i.e. diabetes, asthma, etc.) ---Yes  List chronic conditions. ---Meniere's disease, urinary problems, anxiety     Guidelines    Guideline Title Affirmed Question Affirmed Notes  Anxiety and Panic Attack Patient sounds very sick or weak to the triager    Final Disposition User   Go to ED Now (or PCP triage) [place], RN, [place] states that she is going to go to the local ED to be evaluated. Caller is wondering if Dr. Burnice Logan could recommend a physician for her PCP that is closer to where she lives now in [place].

## 2014-04-06 NOTE — Telephone Encounter (Signed)
Dr.K, pt wants to know if you know of a PCP in [place] for her to see that is closer to her home?

## 2014-04-06 NOTE — Telephone Encounter (Signed)
Pt states she is having anxiety issues for not being well for over 2 wks. Pt wants appt w/ dr Raliegh Ip.  Advised dr Raliegh Ip did not have any appts. Pt has been to UC w/in the past 2 wks and should need a 30.min. No appts avail transferrred to [facility]

## 2014-04-07 MED ORDER — RIZATRIPTAN BENZOATE 5 MG PO TABS: 5.0000 mg | ORAL_TABLET | ORAL | Status: DC | PRN

## 2014-04-07 NOTE — Telephone Encounter (Signed)
Spoke to pt, told her Dr.K does not know anyone in [place] to check with local medical society. Pt verbalized understanding and said she went to ER yesterday and was given the name of a provider in [place]. Also pt said needed a refill on Maxalt. Told pt okay refill sent to pharmacy. Pt verbalized understanding.

## 2014-05-25 ENCOUNTER — Encounter: Payer: Self-pay | Admitting: Internal Medicine

## 2014-05-25 ENCOUNTER — Ambulatory Visit (INDEPENDENT_AMBULATORY_CARE_PROVIDER_SITE_OTHER): Payer: 59 | Admitting: Internal Medicine

## 2014-05-25 VITALS — BP 100/60 | HR 79 | Temp 97.9°F | Resp 18 | Ht 60.5 in | Wt 130.0 lb

## 2014-05-25 DIAGNOSIS — E785 Hyperlipidemia, unspecified: Secondary | ICD-10-CM

## 2014-05-25 DIAGNOSIS — F411 Generalized anxiety disorder: Secondary | ICD-10-CM

## 2014-05-25 MED ORDER — FLUOXETINE HCL 40 MG PO CAPS: 40.0000 mg | ORAL_CAPSULE | Freq: Every day | ORAL | Status: DC

## 2014-05-25 MED ORDER — FLUOXETINE HCL 20 MG PO TABS
40.0000 mg | ORAL_TABLET | Freq: Every day | ORAL | Status: DC
Start: 2014-05-25 — End: 2014-11-10

## 2014-05-25 NOTE — Progress Notes (Signed)
Subjective:    Patient ID: Allison Jimenez, female    DOB: 1950-08-05, 64 y.o.   MRN: [id number]  HPI  64 year old patient who is seen today in follow-up.  She has a history of anxiety disorder with panic component.  She was seen in the ED recently with a panic attack.  She is followed by ENT due to Mnire's disease.  She continues to smoke.  She is followed by a counselor but still is having anxiety.  She states that she is having a very difficult time adjusting to retirement.  Past Medical History  Diagnosis Date  . ANXIETY 06/05/2006  . BACK PAIN 01/05/2010  . DIVERTICULOSIS, COLON 06/05/2006  . DIZZINESS OR VERTIGO 06/05/2006    positional  . HYPERLIPIDEMIA 06/05/2006  . HYPOKALEMIA 12/14/2008  . MENIERE'S DISEASE 06/25/2006  . TOBACCO USER 11/25/2007  . Cataract     bilateral, left worse  . Hearing loss in left ear     History   Social History  . Marital Status: Married    Spouse Name: N/A  . Number of Children: 1  . Years of Education: 13   Occupational History  .      Retired   Social History Main Topics  . Smoking status: Current Some Day Smoker -- 0.50 packs/day    Types: Cigarettes  . Smokeless tobacco: Never Used     Comment: using electronic cigs.-12 cigs a day as well  . Alcohol Use: No  . Drug Use: No  . Sexual Activity: Not on file   Other Topics Concern  . Not on file   Social History Narrative   Patient lives at home alone. Patient has a Data processing manager.     Past Surgical History  Procedure Laterality Date  . Appendectomy    . Cholecystectomy    . Cosmetic surgery    . Tubal ligation    . Carpal tunnel release    . Vocal cord surgery      nodule x2  . Mastoid surgery      shunt in mastoid  . Colonoscopy    . Cesarean section      x1    Family History  Problem Relation Age of Onset  . Migraines    . Depression    . Anxiety disorder    . High Cholesterol    . Colon cancer Neg Hx   . Rectal cancer Neg Hx   . Stomach cancer Neg Hx      Allergies  Allergen Reactions  . Erythromycin Base   . Penicillins     Current Outpatient Prescriptions on File Prior to Visit  Medication Sig Dispense Refill  . ALPRAZolam (XANAX) 0.25 MG tablet TAKE 1 TABLET BY MOUTH 3 TIMES A DAY 90 tablet 2  . atorvastatin (LIPITOR) 10 MG tablet Take 1 tablet (10 mg total) by mouth daily. 90 tablet 6  . chlorthalidone (HYGROTON) 25 MG tablet Take 1 tablet (25 mg total) by mouth daily. 90 tablet 4  . FLUoxetine (PROZAC) 10 MG tablet TAKE 1 TABLET EVERY DAY 90 tablet 3  . FLUoxetine (PROZAC) 20 MG capsule TAKE 1 CAPSULE BY MOUTH EVERY DAY 90 capsule 1  . meloxicam (MOBIC) 7.5 MG tablet Take 7.5 mg by mouth daily as needed.    Marland Kitchen PATADAY 0.2 % SOLN 1 drop.     . potassium chloride 20 MEQ/15ML (10%) solution Take 15 mLs (20 mEq total) by mouth daily. 300 mL 6  . promethazine (PHENERGAN)  25 MG tablet TAKE 1 TABLET EVERY 8 HOURS AS NEEDED FOR NAUSEA 20 tablet 0  . rizatriptan (MAXALT) 5 MG tablet Take 1 tablet (5 mg total) by mouth as needed for migraine. May repeat in 2 hours if needed 10 tablet 1   No current facility-administered medications on file prior to visit.    BP 100/60 mmHg  Pulse 79  Temp(Src) 97.9 F (36.6 C) (Oral)  Resp 18  Ht 5' 0.5" (1.537 m)  Wt 130 lb (58.968 kg)  BMI 24.96 kg/m2  SpO2 96%     Review of Systems  Constitutional: Negative.   HENT: Negative for congestion, dental problem, hearing loss, rhinorrhea, sinus pressure, sore throat and tinnitus.   Eyes: Negative for pain, discharge and visual disturbance.  Respiratory: Negative for cough and shortness of breath.   Cardiovascular: Negative for chest pain, palpitations and leg swelling.  Gastrointestinal: Negative for nausea, vomiting, abdominal pain, diarrhea, constipation, blood in stool and abdominal distention.  Genitourinary: Negative for dysuria, urgency, frequency, hematuria, flank pain, vaginal bleeding, vaginal discharge, difficulty urinating, vaginal  pain and pelvic pain.  Musculoskeletal: Negative for joint swelling, arthralgias and gait problem.  Skin: Negative for rash.  Neurological: Negative for dizziness, syncope, speech difficulty, weakness, numbness and headaches.  Hematological: Negative for adenopathy.  Psychiatric/Behavioral: Positive for behavioral problems, sleep disturbance and dysphoric mood. Negative for agitation. The patient is hyperactive. The patient is not nervous/anxious.        Objective:   Physical Exam  Constitutional: She is oriented to person, place, and time. She appears well-developed and well-nourished.  HENT:  Head: Normocephalic.  Right Ear: External ear normal.  Left Ear: External ear normal.  Mouth/Throat: Oropharynx is clear and moist.  Eyes: Conjunctivae and EOM are normal. Pupils are equal, round, and reactive to light.  Neck: Normal range of motion. Neck supple. No thyromegaly present.  Cardiovascular: Normal rate, regular rhythm, normal heart sounds and intact distal pulses.   Pulmonary/Chest: Effort normal and breath sounds normal.  Abdominal: Soft. Bowel sounds are normal. She exhibits no mass. There is no tenderness.  Musculoskeletal: Normal range of motion.  Lymphadenopathy:    She has no cervical adenopathy.  Neurological: She is alert and oriented to person, place, and time.  Skin: Skin is warm and dry. No rash noted.  Psychiatric: She has a normal mood and affect. Her behavior is normal.          Assessment & Plan:   Anxiety disorder Ongoing tobacco use Mnire's disease  Continue to follow with her counselor.  Increase Prozac to 40 mg daily  CPX in 6 months

## 2014-05-25 NOTE — Progress Notes (Signed)
Pre visit review using our clinic review tool, if applicable. No additional management support is needed unless otherwise documented below in the visit note. 

## 2014-05-25 NOTE — Patient Instructions (Signed)
It is important that you exercise regularly, at least 20 minutes 3 to 4 times per week.  If you develop chest pain or shortness of breath seek  medical attention.  Return in 6 months for follow-up  

## 2014-06-02 ENCOUNTER — Telehealth: Payer: Self-pay

## 2014-06-02 NOTE — Telephone Encounter (Signed)
Spoke with pt., stated that she had mammogram Sept., 2015 at [facility] office but could not remember name.

## 2014-06-06 ENCOUNTER — Other Ambulatory Visit: Payer: Self-pay | Admitting: Internal Medicine

## 2014-07-12 ENCOUNTER — Other Ambulatory Visit: Payer: Self-pay | Admitting: *Deleted

## 2014-07-12 MED ORDER — RIZATRIPTAN BENZOATE 5 MG PO TABS
5.0000 mg | ORAL_TABLET | ORAL | Status: DC | PRN
Start: 2014-07-12 — End: 2014-08-12

## 2014-07-13 ENCOUNTER — Telehealth: Payer: Self-pay | Admitting: Internal Medicine

## 2014-07-13 DIAGNOSIS — H8110 Benign paroxysmal vertigo, unspecified ear: Secondary | ICD-10-CM

## 2014-07-13 DIAGNOSIS — G43909 Migraine, unspecified, not intractable, without status migrainosus: Secondary | ICD-10-CM

## 2014-07-13 DIAGNOSIS — H8109 Meniere's disease, unspecified ear: Secondary | ICD-10-CM

## 2014-07-13 NOTE — Telephone Encounter (Signed)
Pt need a referral to the following doctors.   Pt call to ask for a referral to see Dr Marcial Pacas at [facility] for migraines    Pt said she also need to extend her visit to the [facility] ENT     Dr Raeanne Gathers  Audiologist  For  Benign  Paroxysmal Positional Vertigo

## 2014-07-15 NOTE — Telephone Encounter (Signed)
Left detailed message on personal voicemail, that orders for all referrals were done as requested and our referral coordinator will be in touch with you regarding appointments or there office directly. Any questions please call the office.

## 2014-07-29 ENCOUNTER — Encounter: Payer: Self-pay | Admitting: *Deleted

## 2014-08-12 ENCOUNTER — Ambulatory Visit (INDEPENDENT_AMBULATORY_CARE_PROVIDER_SITE_OTHER): Payer: 59 | Admitting: Neurology

## 2014-08-12 ENCOUNTER — Encounter: Payer: Self-pay | Admitting: Neurology

## 2014-08-12 VITALS — BP 115/65 | HR 69 | Ht 61.0 in | Wt 130.0 lb

## 2014-08-12 DIAGNOSIS — H9193 Unspecified hearing loss, bilateral: Secondary | ICD-10-CM

## 2014-08-12 DIAGNOSIS — G43709 Chronic migraine without aura, not intractable, without status migrainosus: Secondary | ICD-10-CM | POA: Diagnosis not present

## 2014-08-12 DIAGNOSIS — R42 Dizziness and giddiness: Secondary | ICD-10-CM | POA: Diagnosis not present

## 2014-08-12 DIAGNOSIS — IMO0002 Reserved for concepts with insufficient information to code with codable children: Secondary | ICD-10-CM

## 2014-08-12 MED ORDER — RIZATRIPTAN BENZOATE 10 MG PO TABS: 10.0000 mg | ORAL_TABLET | ORAL | Status: DC | PRN

## 2014-08-12 MED ORDER — TOPIRAMATE 25 MG PO TABS: ORAL_TABLET | ORAL | Status: DC

## 2014-08-12 NOTE — Progress Notes (Signed)
PATIENT: Allison Jimenez DOB: [birth year]  Chief Complaint  Patient presents with  . Migraine    RM 5 - Patient has a headache daily,      HISTORICAL  Allison Jimenez is a 64 years old female, was referred by ENT Dr. Ernesto Rutherford, also primary care physician Dr. Burnice Logan for evaluation of abnormal MRI scan, headaches, dizziness spell, last clinical visit was October 2014  She had past medical history of anxiety depression, hyperlipidemia, long-standing history of Mnire's disease, also reported a history of migraine headaches all her life  Over the years, she had recurrent episode of dizziness, constant ringing in her left ear, she also have episodes of vertex area pounding headache with associated light headedness, unbalanced gait, nausea vomiting, light sensitivity, lasting for 3 days, she had those episodes 2-3 times each year,  She has been treated with diuretic hydrochlorothiazide 25 mg since 1992, with diagnosis of Mnire disease, frequent dizziness spells with headaches, she underwent left mastoidectomy, which only improve her symptoms temporarily,  Recent hearing test showed severe left-sided hearing loss, hearing threshold is at 70 decibel  At last clinical visit in October 2014, she was started on Trokendi, shortly afterwards, she presented with frequent dizziness spells, frequent headaches, she was put on clonazepam, she complains of excessive drowsiness, along with her disabilities rating frequent headaches, dizziness, she has lost her job  She came in today, continued to complains daily bilateral frontal bilateral temporal region moderate headaches, she is taking aspirin 325 mg 12 tablets each day for many years, to ease up her headaches, in addition she has 1 or 2 each week much more severe pounding headaches with associated light noise sensitivity, Maxalt as needed has been helpful, but she only gets 4 tablets each months from her insurance company  I also reviewed her  MRI of the brain without contrast in September 2014, compared to previous scan in 2008, continued evidence of mild periventricular small vessel disease, mild atrophy, slight progression  REVIEW OF SYSTEMS: Full 14 system review of systems performed and notable only for hearing loss, ringing ears, spinning sensation, trouble swallowing, confusion, headaches, dizziness, insomnia, sleepiness, restless legs, depression, anxiety, not enough sleep, decreased energy  ALLERGIES: Allergies  Allergen Reactions  . Erythromycin Base   . Penicillins     HOME MEDICATIONS: Current Outpatient Prescriptions  Medication Sig Dispense Refill  . ALPRAZolam (XANAX) 0.25 MG tablet TAKE 1 TABLET BY MOUTH 3 TIMES A DAY 90 tablet 2  . aspirin EC 325 MG tablet Take 325 mg by mouth daily.    Marland Kitchen atorvastatin (LIPITOR) 10 MG tablet Take 1 tablet (10 mg total) by mouth daily. 90 tablet 6  . chlorthalidone (HYGROTON) 25 MG tablet Take 1 tablet (25 mg total) by mouth daily. 90 tablet 4  . FLUoxetine (PROZAC) 20 MG tablet Take 2 tablets (40 mg total) by mouth daily. 180 tablet 4  . meloxicam (MOBIC) 7.5 MG tablet Take 7.5 mg by mouth daily as needed.    Marland Kitchen PATADAY 0.2 % SOLN 1 drop.     . potassium chloride 20 MEQ/15ML (10%) solution Take 15 mLs (20 mEq total) by mouth daily. 300 mL 6  . promethazine (PHENERGAN) 25 MG tablet TAKE 1 TABLET EVERY 8 HOURS AS NEEDED FOR NAUSEA 20 tablet 0  . rizatriptan (MAXALT) 5 MG tablet Take 1 tablet (5 mg total) by mouth as needed for migraine. May repeat in 2 hours if needed 10 tablet 1   No current facility-administered medications  for this visit.    PAST MEDICAL HISTORY: Past Medical History  Diagnosis Date  . ANXIETY 06/05/2006  . BACK PAIN 01/05/2010  . DIVERTICULOSIS, COLON 06/05/2006  . DIZZINESS OR VERTIGO 06/05/2006    positional  . HYPERLIPIDEMIA 06/05/2006  . HYPOKALEMIA 12/14/2008  . MENIERE'S DISEASE 06/25/2006  . TOBACCO USER 11/25/2007  . Cataract     bilateral, left worse   . Hearing loss in left ear     PAST SURGICAL HISTORY: Past Surgical History  Procedure Laterality Date  . Appendectomy    . Cholecystectomy    . Cosmetic surgery    . Tubal ligation    . Carpal tunnel release    . Vocal cord surgery      nodule x2  . Mastoid surgery      shunt in mastoid  . Colonoscopy    . Cesarean section      x1    FAMILY HISTORY: Family History  Problem Relation Age of Onset  . Migraines    . Depression    . Anxiety disorder    . High Cholesterol    . Colon cancer Neg Hx   . Rectal cancer Neg Hx   . Stomach cancer Neg Hx     SOCIAL HISTORY:  Social History   Social History  . Marital Status: Married    Spouse Name: N/A  . Number of Children: 1  . Years of Education: 13   Occupational History  .      Retired   Social History Main Topics  . Smoking status: Current Some Day Smoker -- 0.50 packs/day    Types: Cigarettes  . Smokeless tobacco: Never Used     Comment: using electronic cigs.-12 cigs a day as well  . Alcohol Use: No  . Drug Use: No  . Sexual Activity: Not on file   Other Topics Concern  . Not on file   Social History Narrative   Patient lives at home alone. Patient has a Data processing manager. Patient is divorced .   Patient is retired.   Education high school.   Right handed.   Caffeine none.     PHYSICAL EXAM   Filed Vitals:   08/12/14 1303  BP: 115/65  Pulse: 69  Height: 5\' 1"  (1.549 m)  Weight: 130 lb (58.968 kg)    Not recorded      Body mass index is 24.58 kg/(m^2).  PHYSICAL EXAMNIATION:  Gen: NAD, conversant, well nourised, obese, well groomed                     Cardiovascular: Regular rate rhythm, no peripheral edema, warm, nontender. Eyes: Conjunctivae clear without exudates or hemorrhage Neck: Supple, no carotid bruise. Pulmonary: Clear to auscultation bilaterally   NEUROLOGICAL EXAM:  MENTAL STATUS: Speech:    Speech is normal; fluent and spontaneous with normal comprehension.    Cognition:     Orientation to time, place and person     Normal recent and remote memory     Normal Attention span and concentration     Normal Language, naming, repeating,spontaneous speech     Fund of knowledge   CRANIAL NERVES: CN II: Visual fields are full to confrontation. Fundoscopic exam is normal with sharp discs and no vascular changes. Pupils are round equal and briskly reactive to light. CN III, IV, VI: extraocular movement are normal. No ptosis. CN V: Facial sensation is intact to pinprick in all 3 divisions bilaterally. Corneal responses are  intact.  CN VII: Face is symmetric with normal eye closure and smile. CN VIII: Decreased hearing at left ear, bony conduction is more than air conduction, bilateral tympanic membrane were intact CN IX, X: Palate elevates symmetrically. Phonation is normal. CN XI: Head turning and shoulder shrug are intact CN XII: Tongue is midline with normal movements and no atrophy.  MOTOR: There is no pronator drift of out-stretched arms. Muscle bulk and tone are normal. Muscle strength is normal.  REFLEXES: Reflexes are 2+ and symmetric at the biceps, triceps, knees, and ankles. Plantar responses are flexor.  SENSORY: Intact to light touch, pinprick, position sense, and vibration sense are intact in fingers and toes.  COORDINATION: Rapid alternating movements and fine finger movements are intact. There is no dysmetria on finger-to-nose and heel-knee-shin.    GAIT/STANCE: Posture is normal. Gait is steady with normal steps, base, arm swing, and turning. Heel and toe walking are normal. Tandem gait is normal.  Romberg is absent.   DIAGNOSTIC DATA (LABS, IMAGING, TESTING) - I reviewed patient records, labs, notes, testing and imaging myself where available.   ASSESSMENT AND PLAN  Allison Jimenez is a 64 y.o. female   Chronic migraine, component of medicine rebound headaches, she takes aspirin 325 mg 12 tablets daily  I emphasized the  importance of stop daily aspirin use, she should only take aspirin 325 mg 1 tablet every day  Only treat moderate to severe headaches with Maxalt as needed  Add on preventive medication Topamax 25 mg titrating to 2 tablets twice a day  The potential preventive medication also includes beta blocker, verapamil, nortriptyline, if she failed more than 3 preventive medications, may consider Botox injection as migraine prevention Depression anxiety  Keep Prozac daily    Marcial Pacas, M.D. Ph.D.  [facility] [address], [place], [place] [address] Ph: [phone] Fax: [phone]  CC: Referring Provider

## 2014-08-24 ENCOUNTER — Other Ambulatory Visit: Payer: Self-pay | Admitting: *Deleted

## 2014-08-24 MED ORDER — CHLORTHALIDONE 25 MG PO TABS: 25.0000 mg | ORAL_TABLET | Freq: Every day | ORAL | Status: DC

## 2014-09-24 ENCOUNTER — Other Ambulatory Visit: Payer: Self-pay | Admitting: *Deleted

## 2014-09-24 MED ORDER — ALPRAZOLAM 0.25 MG PO TABS: 0.2500 mg | ORAL_TABLET | Freq: Three times a day (TID) | ORAL | Status: DC

## 2014-10-04 ENCOUNTER — Other Ambulatory Visit: Payer: Self-pay | Admitting: Internal Medicine

## 2014-10-04 NOTE — Telephone Encounter (Signed)
Okay to refill Phenergan? 

## 2014-10-05 NOTE — Telephone Encounter (Signed)
ok 

## 2014-10-14 ENCOUNTER — Ambulatory Visit: Payer: 59 | Admitting: Nurse Practitioner

## 2014-11-04 ENCOUNTER — Telehealth: Payer: Self-pay | Admitting: Internal Medicine

## 2014-11-04 NOTE — Telephone Encounter (Signed)
Yes, if you can find one.

## 2014-11-04 NOTE — Telephone Encounter (Signed)
Pt would like cpx before end of year. Can I create 30 min slot? °

## 2014-11-05 NOTE — Telephone Encounter (Signed)
Pt has been sch

## 2014-11-10 ENCOUNTER — Other Ambulatory Visit: Payer: Self-pay | Admitting: Internal Medicine

## 2014-11-30 ENCOUNTER — Other Ambulatory Visit (INDEPENDENT_AMBULATORY_CARE_PROVIDER_SITE_OTHER): Payer: 59

## 2014-11-30 DIAGNOSIS — Z0000 Encounter for general adult medical examination without abnormal findings: Secondary | ICD-10-CM | POA: Diagnosis not present

## 2014-11-30 LAB — CBC WITH DIFFERENTIAL/PLATELET
BASOS ABS: 0 10*3/uL (ref 0.0–0.1)
Basophils Relative: 0.7 % (ref 0.0–3.0)
EOS PCT: 4.8 % (ref 0.0–5.0)
Eosinophils Absolute: 0.3 10*3/uL (ref 0.0–0.7)
HCT: 43.1 % (ref 36.0–46.0)
HEMOGLOBIN: 14.3 g/dL (ref 12.0–15.0)
LYMPHS ABS: 2.2 10*3/uL (ref 0.7–4.0)
Lymphocytes Relative: 31.9 % (ref 12.0–46.0)
MCHC: 33.3 g/dL (ref 30.0–36.0)
MCV: 87.2 fl (ref 78.0–100.0)
MONOS PCT: 7.5 % (ref 3.0–12.0)
Monocytes Absolute: 0.5 10*3/uL (ref 0.1–1.0)
NEUTROS PCT: 55.1 % (ref 43.0–77.0)
Neutro Abs: 3.9 10*3/uL (ref 1.4–7.7)
Platelets: 306 10*3/uL (ref 150.0–400.0)
RBC: 4.94 Mil/uL (ref 3.87–5.11)
RDW: 13.6 % (ref 11.5–15.5)
WBC: 7 10*3/uL (ref 4.0–10.5)

## 2014-11-30 LAB — BASIC METABOLIC PANEL
BUN: 17 mg/dL (ref 6–23)
CALCIUM: 10.1 mg/dL (ref 8.4–10.5)
CHLORIDE: 96 meq/L (ref 96–112)
CO2: 32 mEq/L (ref 19–32)
CREATININE: 0.78 mg/dL (ref 0.40–1.20)
GFR: 79.24 mL/min (ref >60.00)
Glucose, Bld: 89 mg/dL (ref 70–99)
Potassium: 4.2 mEq/L (ref 3.5–5.1)
SODIUM: 137 meq/L (ref 135–145)

## 2014-11-30 LAB — HEPATIC FUNCTION PANEL
ALBUMIN: 4.2 g/dL (ref 3.5–5.2)
ALK PHOS: 50 U/L (ref 39–117)
ALT: 15 U/L (ref 0–35)
AST: 23 U/L (ref 0–37)
BILIRUBIN TOTAL: 0.5 mg/dL (ref 0.2–1.2)
Bilirubin, Direct: 0.1 mg/dL (ref 0.0–0.3)
Total Protein: 6.9 g/dL (ref 6.0–8.3)

## 2014-11-30 LAB — POCT URINALYSIS DIPSTICK
BILIRUBIN UA: NEGATIVE
GLUCOSE UA: NEGATIVE
KETONES UA: NEGATIVE
LEUKOCYTES UA: NEGATIVE
NITRITE UA: NEGATIVE
Protein, UA: NEGATIVE
Spec Grav, UA: 1.02
Urobilinogen, UA: 0.2
pH, UA: 6.5

## 2014-11-30 LAB — LIPID PANEL
CHOL/HDL RATIO: 4
Cholesterol: 214 mg/dL — ABNORMAL HIGH (ref 0–200)
HDL: 51.4 mg/dL (ref >39.00)
LDL CALC: 143 mg/dL — AB (ref 0–99)
NONHDL: 162.55
TRIGLYCERIDES: 96 mg/dL (ref 0.0–149.0)
VLDL: 19.2 mg/dL (ref 0.0–40.0)

## 2014-11-30 LAB — TSH: TSH: 3.06 u[IU]/mL (ref 0.35–4.50)

## 2014-12-06 ENCOUNTER — Ambulatory Visit (INDEPENDENT_AMBULATORY_CARE_PROVIDER_SITE_OTHER): Payer: 59 | Admitting: Internal Medicine

## 2014-12-06 ENCOUNTER — Encounter: Payer: Self-pay | Admitting: Internal Medicine

## 2014-12-06 VITALS — BP 110/70 | HR 64 | Temp 98.1°F | Resp 18 | Ht 60.5 in | Wt 134.0 lb

## 2014-12-06 DIAGNOSIS — G43009 Migraine without aura, not intractable, without status migrainosus: Secondary | ICD-10-CM | POA: Diagnosis not present

## 2014-12-06 DIAGNOSIS — Z0000 Encounter for general adult medical examination without abnormal findings: Secondary | ICD-10-CM

## 2014-12-06 DIAGNOSIS — L409 Psoriasis, unspecified: Secondary | ICD-10-CM

## 2014-12-06 DIAGNOSIS — E785 Hyperlipidemia, unspecified: Secondary | ICD-10-CM

## 2014-12-06 DIAGNOSIS — F17200 Nicotine dependence, unspecified, uncomplicated: Secondary | ICD-10-CM | POA: Diagnosis not present

## 2014-12-06 MED ORDER — ALPRAZOLAM 0.25 MG PO TABS: 0.2500 mg | ORAL_TABLET | Freq: Three times a day (TID) | ORAL | Status: DC

## 2014-12-06 NOTE — Progress Notes (Signed)
Patient ID: Allison Jimenez, female   DOB: 08-08-1950, 64 y.o.   MRN: [id number]  Subjective:    Patient ID: Allison Jimenez, female    DOB: [birth year], 64 y.o.   MRN: [id number]  HPI 64year-old patient who is seen today for an annual exam.  She has a history of tobacco use but has decreased her tobacco consumption. She is followed  by GYN and had a mammogram in November 2015. She is also followed by GI. She does get annual eye exams. She has a history of anxiety disorder which has been stable. She is on Lipitor for mild dyslipidemia. Laboratory studies reviewed. No new concerns or complaints.   She is on diuretic therapy for Mnire's disease. She has also been followed by ENT.   Allergies:  1) ! Penicillin V Potassium (Penicillin V Potassium)  2) ! Erythromycin Base (Erythromycin Base)   Past History:  Past Medical History:   Anxiety  Diverticulosis, colon  Dizziness or vertigo  Hyperlipidemia  Mnire's disease  tobacco abuse  impaired glucose tolerance   Past Surgical History:   Appendectomy 1996  Caesarean section gravida one, para one, abortus one  Cholecystectomy laparoscopic 1997  Tubal ligation  Mastoid surgery  status post laparoscopy for ovarian cyst  surgery for vocal cord nodule  status post carpal tunnel release  colonoscopy January 2006   Family History:   mother died age 28, with a history of bilateral breast cancer and cerebrovascular disease  (10/12) Father age 80  One sister in good health  A number of aunts and uncles with the diabetes   Social History:   Divorced  one adult son      Review of Systems  Constitutional: Negative for fever, appetite change, fatigue and unexpected weight change.  HENT: Negative for congestion, dental problem, ear pain, hearing loss, mouth sores, nosebleeds, sinus pressure, sore throat, tinnitus, trouble swallowing and voice change.   Eyes: Negative for photophobia, pain, redness and visual disturbance.  Respiratory:  Positive for cough. Negative for chest tightness and shortness of breath.   Cardiovascular: Negative for chest pain, palpitations and leg swelling.  Gastrointestinal: Negative for nausea, vomiting, abdominal pain, diarrhea, constipation, blood in stool, abdominal distention and rectal pain.  Genitourinary: Negative for dysuria, urgency, frequency, hematuria, flank pain, vaginal bleeding, vaginal discharge, difficulty urinating, genital sores, vaginal pain, menstrual problem and pelvic pain.  Musculoskeletal: Negative for back pain, arthralgias and neck stiffness.  Skin: Negative for rash.  Neurological: Negative for dizziness, syncope, speech difficulty, weakness, light-headedness, numbness and headaches.  Hematological: Negative for adenopathy. Does not bruise/bleed easily.  Psychiatric/Behavioral: Negative for suicidal ideas, behavioral problems, self-injury, dysphoric mood and agitation. The patient is not nervous/anxious.        Objective:   Physical Exam  Constitutional: She is oriented to person, place, and time. She appears well-developed and well-nourished.  HENT:  Head: Normocephalic and atraumatic.  Right Ear: External ear normal.  Left Ear: External ear normal.  Mouth/Throat: Oropharynx is clear and moist.  Eyes: Conjunctivae and EOM are normal.  Neck: Normal range of motion. Neck supple. No JVD present. No thyromegaly present.  Cardiovascular: Normal rate, regular rhythm, normal heart sounds and intact distal pulses.   No murmur heard. Pulmonary/Chest: Effort normal. She has no wheezes. She has no rales.  Abdominal: Soft. Bowel sounds are normal. She exhibits no distension and no mass. There is no tenderness. There is no rebound and no guarding.  Musculoskeletal: Normal range of motion. She exhibits no edema  or tenderness.  Neurological: She is alert and oriented to person, place, and time. She has normal reflexes. No cranial nerve deficit. She exhibits normal muscle tone.  Coordination normal.  Skin: Skin is warm and dry. No rash noted.  Psychiatric: She has a normal mood and affect. Her behavior is normal.          Assessment & Plan:    Annual health examination.  Will schedule follow-up colonoscopy and bone density Tobacco use.  Dyslipidemia.  New guidelines for cholesterol management discussed at length.  Presently, she does not take Lipitor  Mnire's disease, stable Anxiety disorder, stable History migraines, stable  Total tobacco use encouraged. Calcium and vitamin D supplements encouraged GYN and GI followup as scheduled  Return here in one year or as needed Regular  exercise encouraged

## 2014-12-06 NOTE — Patient Instructions (Signed)
Smoking tobacco is very bad for your health. You should stop smoking immediately.    It is important that you exercise regularly, at least 20 minutes 3 to 4 times per week.  If you develop chest pain or shortness of breath seek  medical attention.  Take a calcium supplement, plus [phone] units of vitamin D  Schedule your colonoscopy to help detect colon cancer.  Menopause is a normal process in which your reproductive ability comes to an end. This process happens gradually over a span of months to years, usually between the ages of 7 and 78. Menopause is complete when you have missed 12 consecutive menstrual periods. It is important to talk with your health care provider about some of the most common conditions that affect postmenopausal women, such as heart disease, cancer, and bone loss (osteoporosis). Adopting a healthy lifestyle and getting preventive care can help to promote your health and wellness. Those actions can also lower your chances of developing some of these common conditions. WHAT SHOULD I KNOW ABOUT MENOPAUSE? During menopause, you may experience a number of symptoms, such as:  Moderate-to-severe hot flashes.  Night sweats.  Decrease in sex drive.  Mood swings.  Headaches.  Tiredness.  Irritability.  Memory problems.  Insomnia. Choosing to treat or not to treat menopausal changes is an individual decision that you make with your health care provider. WHAT SHOULD I KNOW ABOUT HORMONE REPLACEMENT THERAPY AND SUPPLEMENTS? Hormone therapy products are effective for treating symptoms that are associated with menopause, such as hot flashes and night sweats. Hormone replacement carries certain risks, especially as you become older. If you are thinking about using estrogen or estrogen with progestin treatments, discuss the benefits and risks with your health care provider. WHAT SHOULD I KNOW ABOUT HEART DISEASE AND STROKE? Heart disease, heart attack, and stroke become  more likely as you age. This may be due, in part, to the hormonal changes that your body experiences during menopause. These can affect how your body processes dietary fats, triglycerides, and cholesterol. Heart attack and stroke are both medical emergencies. There are many things that you can do to help prevent heart disease and stroke:  Have your blood pressure checked at least every 1-2 years. High blood pressure causes heart disease and increases the risk of stroke.  If you are 20-60 years old, ask your health care provider if you should take aspirin to prevent a heart attack or a stroke.  Do not use any tobacco products, including cigarettes, chewing tobacco, or electronic cigarettes. If you need help quitting, ask your health care provider.  It is important to eat a healthy diet and maintain a healthy weight.  Be sure to include plenty of vegetables, fruits, low-fat dairy products, and lean protein.  Avoid eating foods that are high in solid fats, added sugars, or salt (sodium).  Get regular exercise. This is one of the most important things that you can do for your health.  Try to exercise for at least 150 minutes each week. The type of exercise that you do should increase your heart rate and make you sweat. This is known as moderate-intensity exercise.  Try to do strengthening exercises at least twice each week. Do these in addition to the moderate-intensity exercise.  Know your numbers.Ask your health care provider to check your cholesterol and your blood glucose. Continue to have your blood tested as directed by your health care provider. WHAT SHOULD I KNOW ABOUT CANCER SCREENING? There are several types of cancer.  Take the following steps to reduce your risk and to catch any cancer development as early as possible. Breast Cancer  Practice breast self-awareness.  This means understanding how your breasts normally appear and feel.  It also means doing regular breast  self-exams. Let your health care provider know about any changes, no matter how small.  If you are 68 or older, have a clinician do a breast exam (clinical breast exam or CBE) every year. Depending on your age, family history, and medical history, it may be recommended that you also have a yearly breast X-ray (mammogram).  If you have a family history of breast cancer, talk with your health care provider about genetic screening.  If you are at high risk for breast cancer, talk with your health care provider about having an MRI and a mammogram every year.  Breast cancer (BRCA) gene test is recommended for women who have family members with BRCA-related cancers. Results of the assessment will determine the need for genetic counseling and BRCA1 and for BRCA2 testing. BRCA-related cancers include these types:  Breast. This occurs in males or females.  Ovarian.  Tubal. This may also be called fallopian tube cancer.  Cancer of the abdominal or pelvic lining (peritoneal cancer).  Prostate.  Pancreatic. Cervical, Uterine, and Ovarian Cancer Your health care provider may recommend that you be screened regularly for cancer of the pelvic organs. These include your ovaries, uterus, and vagina. This screening involves a pelvic exam, which includes checking for microscopic changes to the surface of your cervix (Pap test).  For women ages 21-65, health care providers may recommend a pelvic exam and a Pap test every three years. For women ages 80-65, they may recommend the Pap test and pelvic exam, combined with testing for human papilloma virus (HPV), every five years. Some types of HPV increase your risk of cervical cancer. Testing for HPV may also be done on women of any age who have unclear Pap test results.  Other health care providers may not recommend any screening for nonpregnant women who are considered low risk for pelvic cancer and have no symptoms. Ask your health care provider if a screening  pelvic exam is right for you.  If you have had past treatment for cervical cancer or a condition that could lead to cancer, you need Pap tests and screening for cancer for at least 20 years after your treatment. If Pap tests have been discontinued for you, your risk factors (such as having a new sexual partner) need to be reassessed to determine if you should start having screenings again. Some women have medical problems that increase the chance of getting cervical cancer. In these cases, your health care provider may recommend that you have screening and Pap tests more often.  If you have a family history of uterine cancer or ovarian cancer, talk with your health care provider about genetic screening.  If you have vaginal bleeding after reaching menopause, tell your health care provider.  There are currently no reliable tests available to screen for ovarian cancer. Lung Cancer Lung cancer screening is recommended for adults 51-32 years old who are at high risk for lung cancer because of a history of smoking. A yearly low-dose CT scan of the lungs is recommended if you:  Currently smoke.  Have a history of at least 30 pack-years of smoking and you currently smoke or have quit within the past 15 years. A pack-year is smoking an average of one pack of cigarettes per day  for one year. Yearly screening should:  Continue until it has been 15 years since you quit.  Stop if you develop a health problem that would prevent you from having lung cancer treatment. Colorectal Cancer  This type of cancer can be detected and can often be prevented.  Routine colorectal cancer screening usually begins at age 56 and continues through age 60.  If you have risk factors for colon cancer, your health care provider may recommend that you be screened at an earlier age.  If you have a family history of colorectal cancer, talk with your health care provider about genetic screening.  Your health care provider  may also recommend using home test kits to check for hidden blood in your stool.  A small camera at the end of a tube can be used to examine your colon directly (sigmoidoscopy or colonoscopy). This is done to check for the earliest forms of colorectal cancer.  Direct examination of the colon should be repeated every 5-10 years until age 67. However, if early forms of precancerous polyps or small growths are found or if you have a family history or genetic risk for colorectal cancer, you may need to be screened more often. Skin Cancer  Check your skin from head to toe regularly.  Monitor any moles. Be sure to tell your health care provider:  About any new moles or changes in moles, especially if there is a change in a mole's shape or color.  If you have a mole that is larger than the size of a pencil eraser.  If any of your family members has a history of skin cancer, especially at a young age, talk with your health care provider about genetic screening.  Always use sunscreen. Apply sunscreen liberally and repeatedly throughout the day.  Whenever you are outside, protect yourself by wearing long sleeves, pants, a wide-brimmed hat, and sunglasses. WHAT SHOULD I KNOW ABOUT OSTEOPOROSIS? Osteoporosis is a condition in which bone destruction happens more quickly than new bone creation. After menopause, you may be at an increased risk for osteoporosis. To help prevent osteoporosis or the bone fractures that can happen because of osteoporosis, the following is recommended:  If you are 62-42 years old, get at least 1,000 mg of calcium and at least 600 mg of vitamin D per day.  If you are older than age 58 but younger than age 64, get at least 1,200 mg of calcium and at least 600 mg of vitamin D per day.  If you are older than age 76, get at least 1,200 mg of calcium and at least 800 mg of vitamin D per day. Smoking and excessive alcohol intake increase the risk of osteoporosis. Eat foods that are  rich in calcium and vitamin D, and do weight-bearing exercises several times each week as directed by your health care provider. WHAT SHOULD I KNOW ABOUT HOW MENOPAUSE AFFECTS [place]? Depression may occur at any age, but it is more common as you become older. Common symptoms of depression include:  Low or sad mood.  Changes in sleep patterns.  Changes in appetite or eating patterns.  Feeling an overall lack of motivation or enjoyment of activities that you previously enjoyed.  Frequent crying spells. Talk with your health care provider if you think that you are experiencing depression. WHAT SHOULD I KNOW ABOUT IMMUNIZATIONS? It is important that you get and maintain your immunizations. These include:  Tetanus, diphtheria, and pertussis (Tdap) booster vaccine.  Influenza every year  before the flu season begins.  Pneumonia vaccine.  Shingles vaccine. Your health care provider may also recommend other immunizations.   This information is not intended to replace advice given to you by your health care provider. Make sure you discuss any questions you have with your health care provider.   Document Released: 02/09/2005 Document Revised: 01/08/2014 Document Reviewed: 08/20/2013 Elsevier Interactive Patient Education [employer].

## 2014-12-08 ENCOUNTER — Other Ambulatory Visit: Payer: Self-pay | Admitting: Obstetrics and Gynecology

## 2014-12-08 DIAGNOSIS — R928 Other abnormal and inconclusive findings on diagnostic imaging of breast: Secondary | ICD-10-CM

## 2014-12-09 ENCOUNTER — Ambulatory Visit (INDEPENDENT_AMBULATORY_CARE_PROVIDER_SITE_OTHER)
Admission: RE | Admit: 2014-12-09 | Discharge: 2014-12-09 | Disposition: A | Discharge status: Home / Self Care | Payer: 59 | Source: Ambulatory Visit | Attending: Internal Medicine | Admitting: Internal Medicine

## 2014-12-09 DIAGNOSIS — Z13820 Encounter for screening for osteoporosis: Secondary | ICD-10-CM

## 2014-12-09 DIAGNOSIS — Z0000 Encounter for general adult medical examination without abnormal findings: Secondary | ICD-10-CM

## 2014-12-14 ENCOUNTER — Ambulatory Visit
Admission: RE | Admit: 2014-12-14 | Discharge: 2014-12-14 | Disposition: A | Discharge status: Home / Self Care | Payer: 59 | Source: Ambulatory Visit | Attending: Obstetrics and Gynecology | Admitting: Obstetrics and Gynecology

## 2014-12-14 DIAGNOSIS — R928 Other abnormal and inconclusive findings on diagnostic imaging of breast: Secondary | ICD-10-CM

## 2015-01-17 ENCOUNTER — Telehealth: Payer: Self-pay | Admitting: Internal Medicine

## 2015-01-17 MED ORDER — ALPRAZOLAM 0.25 MG PO TABS: 0.2500 mg | ORAL_TABLET | Freq: Three times a day (TID) | ORAL | Status: DC

## 2015-01-17 NOTE — Telephone Encounter (Signed)
Pt needs a refill on alprazolam [facility] [address] in [place]. Pt is going out of town this morning.

## 2015-01-17 NOTE — Telephone Encounter (Signed)
Spoke to pt, told her she should have a Rx from her last appt. Pt said she does not have it. Told pt okay will call into the pharmacy. Pt verbalized understanding. Rx called into pharmacy.

## 2015-05-02 ENCOUNTER — Telehealth: Payer: Self-pay | Admitting: *Deleted

## 2015-05-02 MED ORDER — ALPRAZOLAM 0.25 MG PO TABS: 0.2500 mg | ORAL_TABLET | Freq: Three times a day (TID) | ORAL | Status: DC

## 2015-05-02 NOTE — Telephone Encounter (Signed)
[facility] [address] [place] [phone] Refill request ALPRAZolam Duanne Moron) 0.25 MG tablet

## 2015-05-02 NOTE — Telephone Encounter (Signed)
Rx called in to pharmacy. 

## 2015-05-05 ENCOUNTER — Other Ambulatory Visit: Payer: Self-pay | Admitting: Internal Medicine

## 2015-06-22 ENCOUNTER — Telehealth: Payer: Self-pay | Admitting: *Deleted

## 2015-06-22 NOTE — Telephone Encounter (Signed)
Pt records faxed to [facility] on 06/22/2015.

## 2015-07-08 ENCOUNTER — Telehealth: Payer: Self-pay | Admitting: Internal Medicine

## 2015-07-08 MED ORDER — MELOXICAM 7.5 MG PO TABS: 7.5000 mg | ORAL_TABLET | Freq: Every day | ORAL | Status: DC | PRN

## 2015-07-08 NOTE — Telephone Encounter (Signed)
Pt request refill  meloxicam (MOBIC) 7.5 MG tablet [id number]  [facility] [employer]

## 2015-07-08 NOTE — Telephone Encounter (Signed)
Spoke to pt, told her Rx was sent to pharmacy. Pt verbalized understanding. 

## 2015-08-08 ENCOUNTER — Other Ambulatory Visit: Payer: Self-pay | Admitting: Internal Medicine

## 2015-08-08 NOTE — Telephone Encounter (Signed)
Okay to refill? 

## 2015-08-24 ENCOUNTER — Other Ambulatory Visit: Payer: Self-pay | Admitting: Internal Medicine

## 2015-09-01 ENCOUNTER — Other Ambulatory Visit: Payer: Self-pay

## 2015-09-01 MED ORDER — POTASSIUM CHLORIDE 20 MEQ/15ML (10%) PO SOLN: 20.0000 meq | Freq: Every day | ORAL | 0 refills | Status: DC

## 2015-10-19 ENCOUNTER — Other Ambulatory Visit: Payer: Self-pay | Admitting: Internal Medicine

## 2015-11-08 ENCOUNTER — Other Ambulatory Visit: Payer: Self-pay | Admitting: Internal Medicine

## 2015-12-01 ENCOUNTER — Other Ambulatory Visit: Payer: Self-pay | Admitting: Internal Medicine

## 2015-12-07 ENCOUNTER — Other Ambulatory Visit (INDEPENDENT_AMBULATORY_CARE_PROVIDER_SITE_OTHER): Payer: BLUE CROSS/BLUE SHIELD

## 2015-12-07 DIAGNOSIS — Z0000 Encounter for general adult medical examination without abnormal findings: Secondary | ICD-10-CM | POA: Diagnosis not present

## 2015-12-07 LAB — HEPATIC FUNCTION PANEL
ALBUMIN: 4.1 g/dL (ref 3.5–5.2)
ALT: 11 U/L (ref 0–35)
AST: 18 U/L (ref 0–37)
Alkaline Phosphatase: 46 U/L (ref 39–117)
BILIRUBIN DIRECT: 0.1 mg/dL (ref 0.0–0.3)
TOTAL PROTEIN: 6.8 g/dL (ref 6.0–8.3)
Total Bilirubin: 0.3 mg/dL (ref 0.2–1.2)

## 2015-12-07 LAB — POC URINALSYSI DIPSTICK (AUTOMATED)
Bilirubin, UA: NEGATIVE
Blood, UA: NEGATIVE
GLUCOSE UA: NEGATIVE
Ketones, UA: NEGATIVE
Leukocytes, UA: NEGATIVE
NITRITE UA: NEGATIVE
Protein, UA: NEGATIVE
Spec Grav, UA: 1.02
UROBILINOGEN UA: 0.2
pH, UA: 6

## 2015-12-07 LAB — CBC WITH DIFFERENTIAL/PLATELET
BASOS PCT: 0.9 % (ref 0.0–3.0)
Basophils Absolute: 0.1 10*3/uL (ref 0.0–0.1)
EOS ABS: 0.6 10*3/uL (ref 0.0–0.7)
Eosinophils Relative: 8.2 % — ABNORMAL HIGH (ref 0.0–5.0)
HEMATOCRIT: 41.6 % (ref 36.0–46.0)
Hemoglobin: 14.5 g/dL (ref 12.0–15.0)
LYMPHS ABS: 2.1 10*3/uL (ref 0.7–4.0)
LYMPHS PCT: 27.2 % (ref 12.0–46.0)
MCHC: 34.8 g/dL (ref 30.0–36.0)
MCV: 85.4 fl (ref 78.0–100.0)
Monocytes Absolute: 0.6 10*3/uL (ref 0.1–1.0)
Monocytes Relative: 7.8 % (ref 3.0–12.0)
NEUTROS ABS: 4.3 10*3/uL (ref 1.4–7.7)
Neutrophils Relative %: 55.9 % (ref 43.0–77.0)
PLATELETS: 348 10*3/uL (ref 150.0–400.0)
RBC: 4.87 Mil/uL (ref 3.87–5.11)
RDW: 13.8 % (ref 11.5–15.5)
WBC: 7.7 10*3/uL (ref 4.0–10.5)

## 2015-12-07 LAB — BASIC METABOLIC PANEL
BUN: 17 mg/dL (ref 6–23)
CHLORIDE: 98 meq/L (ref 96–112)
CO2: 32 meq/L (ref 19–32)
CREATININE: 0.77 mg/dL (ref 0.40–1.20)
Calcium: 10 mg/dL (ref 8.4–10.5)
GFR: 80.17 mL/min (ref >60.00)
GLUCOSE: 97 mg/dL (ref 70–99)
Potassium: 3.7 mEq/L (ref 3.5–5.1)
Sodium: 137 mEq/L (ref 135–145)

## 2015-12-07 LAB — LIPID PANEL
CHOL/HDL RATIO: 5
Cholesterol: 256 mg/dL — ABNORMAL HIGH (ref 0–200)
HDL: 47.7 mg/dL (ref >39.00)
LDL Cholesterol: 182 mg/dL — ABNORMAL HIGH (ref 0–99)
NonHDL: 207.91
TRIGLYCERIDES: 128 mg/dL (ref 0.0–149.0)
VLDL: 25.6 mg/dL (ref 0.0–40.0)

## 2015-12-07 LAB — TSH: TSH: 3.42 u[IU]/mL (ref 0.35–4.50)

## 2015-12-12 ENCOUNTER — Other Ambulatory Visit: Payer: Self-pay | Admitting: Internal Medicine

## 2015-12-12 ENCOUNTER — Ambulatory Visit (INDEPENDENT_AMBULATORY_CARE_PROVIDER_SITE_OTHER): Payer: BLUE CROSS/BLUE SHIELD | Admitting: Internal Medicine

## 2015-12-12 ENCOUNTER — Encounter: Payer: Self-pay | Admitting: Internal Medicine

## 2015-12-12 VITALS — BP 128/58 | HR 75 | Temp 98.0°F | Ht 61.0 in | Wt 132.0 lb

## 2015-12-12 DIAGNOSIS — Z23 Encounter for immunization: Secondary | ICD-10-CM | POA: Diagnosis not present

## 2015-12-12 DIAGNOSIS — Z0000 Encounter for general adult medical examination without abnormal findings: Secondary | ICD-10-CM

## 2015-12-12 NOTE — Progress Notes (Signed)
Subjective:    Patient ID: Allison Jimenez, female    DOB: 1950/08/23, 65 y.o.   MRN: [id number]  HPI  65 year old patient who is seen today for a preventive health examination.  She has not been seen in one year since her last annual exam She is followed annually by gynecology and is scheduled to be evaluated soon with a mammogram She has essential hypertension that has been well controlled on diuretic therapy. She is also followed by ENT for Mnire's.  She has a history of migraines and dyslipidemia. Other issues include ongoing tobacco use. She has chronic anxiety.  Family history.  Father died recently at 64 from dementia.  Mother also died in her nineties   Past Medical History:  Diagnosis Date  . ANXIETY 06/05/2006  . BACK PAIN 01/05/2010  . Cataract    bilateral, left worse  . DIVERTICULOSIS, COLON 06/05/2006  . DIZZINESS OR VERTIGO 06/05/2006   positional  . Hearing loss in left ear   . HYPERLIPIDEMIA 06/05/2006  . HYPOKALEMIA 12/14/2008  . MENIERE'S DISEASE 06/25/2006  . TOBACCO USER 11/25/2007     Social History   Social History  . Marital status: Married    Spouse name: N/A  . Number of children: 1  . Years of education: 50   Occupational History  .  [place]    Retired   Social History Main Topics  . Smoking status: Current Some Day Smoker    Packs/day: 0.50    Types: Cigarettes  . Smokeless tobacco: Never Used     Comment: using electronic cigs.-12 cigs a day as well  . Alcohol use No  . Drug use: No  . Sexual activity: Not on file   Other Topics Concern  . Not on file   Social History Narrative   Patient lives at home alone. Patient has a Data processing manager. Patient is divorced .   Patient is retired.   Education high school.   Right handed.   Caffeine none.    Past Surgical History:  Procedure Laterality Date  . APPENDECTOMY    . CARPAL TUNNEL RELEASE    . CESAREAN SECTION     x1  . CHOLECYSTECTOMY    . COLONOSCOPY      . COSMETIC SURGERY    . mastoid surgery     shunt in mastoid  . TUBAL LIGATION    . vocal cord surgery     nodule x2    Family History  Problem Relation Age of Onset  . Migraines    . Depression    . Anxiety disorder    . High Cholesterol    . Colon cancer Neg Hx   . Rectal cancer Neg Hx   . Stomach cancer Neg Hx     Allergies  Allergen Reactions  . Erythromycin Base   . Penicillins     Current Outpatient Prescriptions on File Prior to Visit  Medication Sig Dispense Refill  . ALPRAZolam (XANAX) 0.25 MG tablet take 1 tablet by mouth three times a day 90 tablet 2  . aspirin EC 325 MG tablet Take 325 mg by mouth daily.    . chlorthalidone (HYGROTON) 25 MG tablet take 1 tablet by mouth once daily 90 tablet 1  . FLUoxetine (PROZAC) 20 MG tablet take 2 tablets by mouth once daily 60 tablet 5  . meloxicam (MOBIC) 7.5 MG tablet Take 1 tablet (7.5 mg total) by mouth daily as needed. 90 tablet 1  .  potassium chloride 20 MEQ/15ML (10%) SOLN take 15 milliliters by mouth once daily 450 mL 0  . promethazine (PHENERGAN) 25 MG tablet take 1 tablet by mouth every 8 hours if needed 20 tablet 0  . rizatriptan (MAXALT) 10 MG tablet Take 1 tablet (10 mg total) by mouth as needed for migraine. May repeat in 2 hours if needed 6 tablet 6   No current facility-administered medications on file prior to visit.     BP (!) 128/58 (BP Location: Right Arm, Patient Position: Sitting, Cuff Size: Normal)   Pulse 75   Temp 98 F (36.7 C) (Oral)   Ht 5\' 1"  (1.549 m)   Wt 132 lb (59.9 kg)   SpO2 97%   BMI 24.94 kg/m     Review of Systems  Constitutional: Negative for appetite change, fatigue, fever and unexpected weight change.  HENT: Positive for tinnitus. Negative for congestion, dental problem, ear pain, hearing loss, mouth sores, nosebleeds, sinus pressure, sore throat, trouble swallowing and voice change.   Eyes: Negative for photophobia, pain, redness and visual disturbance.  Respiratory:  Negative for cough, chest tightness and shortness of breath.   Cardiovascular: Negative for chest pain, palpitations and leg swelling.  Gastrointestinal: Negative for abdominal distention, abdominal pain, blood in stool, constipation, diarrhea, nausea, rectal pain and vomiting.  Genitourinary: Negative for difficulty urinating, dysuria, flank pain, frequency, genital sores, hematuria, menstrual problem, pelvic pain, urgency, vaginal bleeding, vaginal discharge and vaginal pain.  Musculoskeletal: Negative for arthralgias, back pain and neck stiffness.       Right shoulder pain  Skin: Negative for rash.  Neurological: Negative for dizziness, syncope, speech difficulty, weakness, light-headedness, numbness and headaches.  Hematological: Negative for adenopathy. Does not bruise/bleed easily.  Psychiatric/Behavioral: Negative for agitation, behavioral problems, dysphoric mood, self-injury and suicidal ideas. The patient is not nervous/anxious.        Objective:   Physical Exam  Constitutional: She is oriented to person, place, and time. She appears well-developed and well-nourished.  Blood pressure well controlled  HENT:  Head: Normocephalic and atraumatic.  Right Ear: External ear normal.  Left Ear: External ear normal.  Mouth/Throat: Oropharynx is clear and moist.  Eyes: Conjunctivae and EOM are normal.  Neck: Normal range of motion. Neck supple. No JVD present. No thyromegaly present.  Cardiovascular: Normal rate, regular rhythm, normal heart sounds and intact distal pulses.   No murmur heard. Pulmonary/Chest: Effort normal and breath sounds normal. She has no wheezes. She has no rales.  Few scattered rhonchi  Abdominal: Soft. Bowel sounds are normal. She exhibits no distension and no mass. There is no tenderness. There is no rebound and no guarding.  Musculoskeletal: Normal range of motion. She exhibits no edema or tenderness.  Neurological: She is alert and oriented to person, place,  and time. She has normal reflexes. No cranial nerve deficit. She exhibits normal muscle tone. Coordination normal.  Skin: Skin is warm and dry. No rash noted.  Psychiatric: She has a normal mood and affect. Her behavior is normal.          Assessment & Plan:   Preventive health exam.  Follow-up gynecology with mammogram as scheduled. Reschedule colonoscopy.  This may be delayed until the summer when her sister retires Migraine headaches Dyslipidemia.  Heart healthy diet discussed.  Information dispensed  Recheck one year or as needed  [employer]

## 2015-12-12 NOTE — Progress Notes (Signed)
Pre visit review using our clinic review tool, if applicable. No additional management support is needed unless otherwise documented below in the visit note. 

## 2015-12-12 NOTE — Patient Instructions (Addendum)
Preventive Care 40-64 Years, Female Preventive care refers to lifestyle choices and visits with your health care provider that can promote health and wellness. What does preventive care include?  A yearly physical exam. This is also called an annual well check.  Dental exams once or twice a year.  Routine eye exams. Ask your health care provider how often you should have your eyes checked.  Personal lifestyle choices, including:  Daily care of your teeth and gums.  Regular physical activity.  Eating a healthy diet.  Avoiding tobacco and drug use.  Limiting alcohol use.  Practicing safe sex.  Taking low-dose aspirin daily starting at age 50.  Taking vitamin and mineral supplements as recommended by your health care provider. What happens during an annual well check? The services and screenings done by your health care provider during your annual well check will depend on your age, overall health, lifestyle risk factors, and family history of disease. Counseling  Your health care provider may ask you questions about your:  Alcohol use.  Tobacco use.  Drug use.  Emotional well-being.  Home and relationship well-being.  Sexual activity.  Eating habits.  Work and work environment.  Method of birth control.  Menstrual cycle.  Pregnancy history. Screening  You may have the following tests or measurements:  Height, weight, and BMI.  Blood pressure.  Lipid and cholesterol levels. These may be checked every 5 years, or more frequently if you are over 50 years old.  Skin check.  Lung cancer screening. You may have this screening every year starting at age 55 if you have a 30-pack-year history of smoking and currently smoke or have quit within the past 15 years.  Fecal occult blood test (FOBT) of the stool. You may have this test every year starting at age 50.  Flexible sigmoidoscopy or colonoscopy. You may have a sigmoidoscopy every 5 years or a colonoscopy  every 10 years starting at age 50.  Hepatitis C blood test.  Hepatitis B blood test.  Sexually transmitted disease (STD) testing.  Diabetes screening. This is done by checking your blood sugar (glucose) after you have not eaten for a while (fasting). You may have this done every 1-3 years.  Mammogram. This may be done every 1-2 years. Talk to your health care provider about when you should start having regular mammograms. This may depend on whether you have a family history of breast cancer.  BRCA-related cancer screening. This may be done if you have a family history of breast, ovarian, tubal, or peritoneal cancers.  Pelvic exam and Pap test. This may be done every 3 years starting at age 21. Starting at age 30, this may be done every 5 years if you have a Pap test in combination with an HPV test.  Bone density scan. This is done to screen for osteoporosis. You may have this scan if you are at high risk for osteoporosis. Discuss your test results, treatment options, and if necessary, the need for more tests with your health care provider. Vaccines  Your health care provider may recommend certain vaccines, such as:  Influenza vaccine. This is recommended every year.  Tetanus, diphtheria, and acellular pertussis (Tdap, Td) vaccine. You may need a Td booster every 10 years.  Varicella vaccine. You may need this if you have not been vaccinated.  Zoster vaccine. You may need this after age 60.  Measles, mumps, and rubella (MMR) vaccine. You may need at least one dose of MMR if you were born   in 1957 or later. You may also need a second dose.  Pneumococcal 13-valent conjugate (PCV13) vaccine. You may need this if you have certain conditions and were not previously vaccinated.  Pneumococcal polysaccharide (PPSV23) vaccine. You may need one or two doses if you smoke cigarettes or if you have certain conditions.  Meningococcal vaccine. You may need this if you have certain  conditions.  Hepatitis A vaccine. You may need this if you have certain conditions or if you travel or work in places where you may be exposed to hepatitis A.  Hepatitis B vaccine. You may need this if you have certain conditions or if you travel or work in places where you may be exposed to hepatitis B.  Haemophilus influenzae type b (Hib) vaccine. You may need this if you have certain conditions. Talk to your health care provider about which screenings and vaccines you need and how often you need them. This information is not intended to replace advice given to you by your health care provider. Make sure you discuss any questions you have with your health care provider. Document Released: 01/14/2015 Document Revised: 09/07/2015 Document Reviewed: 10/19/2014 Elsevier Interactive Patient Education  2017 [place] and Cholesterol Restricted Diet Introduction Getting too much fat and cholesterol in your diet may cause health problems. Following this diet helps keep your fat and cholesterol at normal levels. This can keep you from getting sick. What types of fat should I choose?  Choose monosaturated and polyunsaturated fats. These are found in foods such as olive oil, canola oil, flaxseeds, walnuts, almonds, and seeds.  Eat more omega-3 fats. Good choices include salmon, mackerel, sardines, tuna, flaxseed oil, and ground flaxseeds.  Limit saturated fats. These are in animal products such as meats, butter, and cream. They can also be in plant products such as palm oil, palm kernel oil, and coconut oil.  Avoid foods with partially hydrogenated oils in them. These contain trans fats. Examples of foods that have trans fats are stick margarine, some tub margarines, cookies, crackers, and other baked goods. What general guidelines do I need to follow?  Check food labels. Look for the words "trans fat" and "saturated fat."  When preparing a meal:  Fill half of your plate with  vegetables and green salads.  Fill one fourth of your plate with whole grains. Look for the word "whole" as the first word in the ingredient list.  Fill one fourth of your plate with lean protein foods.  Eat more foods that have fiber, like apples, carrots, beans, peas, and barley.  Eat more home-cooked foods. Eat less at restaurants and buffets.  Limit or avoid alcohol.  Limit foods high in starch and sugar.  Limit fried foods.  Cook foods without frying them. Baking, boiling, grilling, and broiling are all great options.  Lose weight if you are overweight. Losing even a small amount of weight can help your overall health. It can also help prevent diseases such as diabetes and heart disease. What foods can I eat? Grains  Whole grains, such as whole wheat or whole grain breads, crackers, cereals, and pasta. Unsweetened oatmeal, bulgur, barley, quinoa, or brown rice. Corn or whole wheat flour tortillas. Vegetables  Fresh or frozen vegetables (raw, steamed, roasted, or grilled). Green salads. Fruits  All fresh, canned (in natural juice), or frozen fruits. Meat and Other Protein Products  Ground beef (85% or leaner), grass-fed beef, or beef trimmed of fat. Skinless chicken or [place]. Ground chicken or [place]. Pork  trimmed of fat. All fish and seafood. Eggs. Dried beans, peas, or lentils. Unsalted nuts or seeds. Unsalted canned or dry beans. Dairy  Low-fat dairy products, such as skim or 1% milk, 2% or reduced-fat cheeses, low-fat ricotta or cottage cheese, or plain low-fat yogurt. Fats and Oils  Tub margarines without trans fats. Light or reduced-fat mayonnaise and salad dressings. Avocado. Olive, canola, sesame, or safflower oils. Natural peanut or almond butter (choose ones without added sugar and oil). The items listed above may not be a complete list of recommended foods or beverages. Contact your dietitian for more options.  What foods are not recommended? Grains  White bread.  White pasta. White rice. Cornbread. Bagels, pastries, and croissants. Crackers that contain trans fat. Vegetables  White potatoes. Corn. Creamed or fried vegetables. Vegetables in a cheese sauce. Fruits  Dried fruits. Canned fruit in light or heavy syrup. Fruit juice. Meat and Other Protein Products  Fatty cuts of meat. Ribs, chicken wings, bacon, sausage, bologna, salami, chitterlings, fatback, hot dogs, bratwurst, and packaged luncheon meats. Liver and organ meats. Dairy  Whole or 2% milk, cream, half-and-half, and cream cheese. Whole milk cheeses. Whole-fat or sweetened yogurt. Full-fat cheeses. Nondairy creamers and whipped toppings. Processed cheese, cheese spreads, or cheese curds. Sweets and Desserts  Corn syrup, sugars, honey, and molasses. Candy. Jam and jelly. Syrup. Sweetened cereals. Cookies, pies, cakes, donuts, muffins, and ice cream. Fats and Oils  Butter, stick margarine, lard, shortening, ghee, or bacon fat. Coconut, palm kernel, or palm oils. Beverages  Alcohol. Sweetened drinks (such as sodas, lemonade, and fruit drinks or punches). The items listed above may not be a complete list of foods and beverages to avoid. Contact your dietitian for more information.  This information is not intended to replace advice given to you by your health care provider. Make sure you discuss any questions you have with your health care provider. Document Released: 06/19/2011 Document Revised: 08/25/2015 Document Reviewed: 03/19/2013  2017 Elsevier  Schedule your colonoscopy to help detect colon cancer.  Take a calcium supplement, plus [phone] units of vitamin D

## 2016-02-02 ENCOUNTER — Other Ambulatory Visit: Payer: Self-pay | Admitting: Internal Medicine

## 2016-02-14 ENCOUNTER — Other Ambulatory Visit: Payer: Self-pay | Admitting: Internal Medicine

## 2016-03-07 ENCOUNTER — Other Ambulatory Visit: Payer: Self-pay | Admitting: Internal Medicine

## 2016-03-11 ENCOUNTER — Other Ambulatory Visit: Payer: Self-pay | Admitting: Internal Medicine

## 2016-04-06 DIAGNOSIS — R937 Abnormal findings on diagnostic imaging of other parts of musculoskeletal system: Secondary | ICD-10-CM | POA: Diagnosis not present

## 2016-04-06 DIAGNOSIS — R946 Abnormal results of thyroid function studies: Secondary | ICD-10-CM | POA: Diagnosis not present

## 2016-04-17 ENCOUNTER — Encounter: Payer: Self-pay | Admitting: Internal Medicine

## 2016-05-07 ENCOUNTER — Other Ambulatory Visit: Payer: Self-pay | Admitting: Internal Medicine

## 2016-05-17 DIAGNOSIS — S39012A Strain of muscle, fascia and tendon of lower back, initial encounter: Secondary | ICD-10-CM | POA: Diagnosis not present

## 2016-05-17 DIAGNOSIS — M5136 Other intervertebral disc degeneration, lumbar region: Secondary | ICD-10-CM | POA: Diagnosis not present

## 2016-05-17 DIAGNOSIS — M9902 Segmental and somatic dysfunction of thoracic region: Secondary | ICD-10-CM | POA: Diagnosis not present

## 2016-05-17 DIAGNOSIS — M542 Cervicalgia: Secondary | ICD-10-CM | POA: Diagnosis not present

## 2016-05-17 DIAGNOSIS — S29019A Strain of muscle and tendon of unspecified wall of thorax, initial encounter: Secondary | ICD-10-CM | POA: Diagnosis not present

## 2016-05-17 DIAGNOSIS — M5032 Other cervical disc degeneration, mid-cervical region, unspecified level: Secondary | ICD-10-CM | POA: Diagnosis not present

## 2016-05-17 DIAGNOSIS — S161XXA Strain of muscle, fascia and tendon at neck level, initial encounter: Secondary | ICD-10-CM | POA: Diagnosis not present

## 2016-05-17 DIAGNOSIS — M9901 Segmental and somatic dysfunction of cervical region: Secondary | ICD-10-CM | POA: Diagnosis not present

## 2016-05-17 DIAGNOSIS — M9903 Segmental and somatic dysfunction of lumbar region: Secondary | ICD-10-CM | POA: Diagnosis not present

## 2016-05-22 ENCOUNTER — Other Ambulatory Visit: Payer: Self-pay | Admitting: Internal Medicine

## 2016-05-24 DIAGNOSIS — M5136 Other intervertebral disc degeneration, lumbar region: Secondary | ICD-10-CM | POA: Diagnosis not present

## 2016-05-24 DIAGNOSIS — M5032 Other cervical disc degeneration, mid-cervical region, unspecified level: Secondary | ICD-10-CM | POA: Diagnosis not present

## 2016-05-24 DIAGNOSIS — S161XXA Strain of muscle, fascia and tendon at neck level, initial encounter: Secondary | ICD-10-CM | POA: Diagnosis not present

## 2016-05-24 DIAGNOSIS — S39012A Strain of muscle, fascia and tendon of lower back, initial encounter: Secondary | ICD-10-CM | POA: Diagnosis not present

## 2016-05-24 DIAGNOSIS — M9901 Segmental and somatic dysfunction of cervical region: Secondary | ICD-10-CM | POA: Diagnosis not present

## 2016-05-24 DIAGNOSIS — S29019A Strain of muscle and tendon of unspecified wall of thorax, initial encounter: Secondary | ICD-10-CM | POA: Diagnosis not present

## 2016-05-24 DIAGNOSIS — M9903 Segmental and somatic dysfunction of lumbar region: Secondary | ICD-10-CM | POA: Diagnosis not present

## 2016-05-24 DIAGNOSIS — M542 Cervicalgia: Secondary | ICD-10-CM | POA: Diagnosis not present

## 2016-05-24 DIAGNOSIS — M9902 Segmental and somatic dysfunction of thoracic region: Secondary | ICD-10-CM | POA: Diagnosis not present

## 2016-05-30 ENCOUNTER — Other Ambulatory Visit: Payer: Self-pay | Admitting: Internal Medicine

## 2016-06-04 DIAGNOSIS — M7542 Impingement syndrome of left shoulder: Secondary | ICD-10-CM | POA: Diagnosis not present

## 2016-06-04 DIAGNOSIS — M25512 Pain in left shoulder: Secondary | ICD-10-CM | POA: Diagnosis not present

## 2016-06-05 DIAGNOSIS — H02102 Unspecified ectropion of right lower eyelid: Secondary | ICD-10-CM | POA: Diagnosis not present

## 2016-06-05 DIAGNOSIS — H02105 Unspecified ectropion of left lower eyelid: Secondary | ICD-10-CM | POA: Diagnosis not present

## 2016-06-05 DIAGNOSIS — H04563 Stenosis of bilateral lacrimal punctum: Secondary | ICD-10-CM | POA: Diagnosis not present

## 2016-06-06 DIAGNOSIS — M9901 Segmental and somatic dysfunction of cervical region: Secondary | ICD-10-CM | POA: Diagnosis not present

## 2016-06-06 DIAGNOSIS — M9903 Segmental and somatic dysfunction of lumbar region: Secondary | ICD-10-CM | POA: Diagnosis not present

## 2016-06-06 DIAGNOSIS — S29019A Strain of muscle and tendon of unspecified wall of thorax, initial encounter: Secondary | ICD-10-CM | POA: Diagnosis not present

## 2016-06-06 DIAGNOSIS — M5136 Other intervertebral disc degeneration, lumbar region: Secondary | ICD-10-CM | POA: Diagnosis not present

## 2016-06-06 DIAGNOSIS — S161XXA Strain of muscle, fascia and tendon at neck level, initial encounter: Secondary | ICD-10-CM | POA: Diagnosis not present

## 2016-06-06 DIAGNOSIS — M5032 Other cervical disc degeneration, mid-cervical region, unspecified level: Secondary | ICD-10-CM | POA: Diagnosis not present

## 2016-06-06 DIAGNOSIS — M542 Cervicalgia: Secondary | ICD-10-CM | POA: Diagnosis not present

## 2016-06-06 DIAGNOSIS — S39012A Strain of muscle, fascia and tendon of lower back, initial encounter: Secondary | ICD-10-CM | POA: Diagnosis not present

## 2016-06-06 DIAGNOSIS — M9902 Segmental and somatic dysfunction of thoracic region: Secondary | ICD-10-CM | POA: Diagnosis not present

## 2016-06-10 DIAGNOSIS — J0100 Acute maxillary sinusitis, unspecified: Secondary | ICD-10-CM | POA: Diagnosis not present

## 2016-06-13 DIAGNOSIS — S39012A Strain of muscle, fascia and tendon of lower back, initial encounter: Secondary | ICD-10-CM | POA: Diagnosis not present

## 2016-06-13 DIAGNOSIS — M9902 Segmental and somatic dysfunction of thoracic region: Secondary | ICD-10-CM | POA: Diagnosis not present

## 2016-06-13 DIAGNOSIS — S161XXA Strain of muscle, fascia and tendon at neck level, initial encounter: Secondary | ICD-10-CM | POA: Diagnosis not present

## 2016-06-13 DIAGNOSIS — M9901 Segmental and somatic dysfunction of cervical region: Secondary | ICD-10-CM | POA: Diagnosis not present

## 2016-06-13 DIAGNOSIS — M542 Cervicalgia: Secondary | ICD-10-CM | POA: Diagnosis not present

## 2016-06-13 DIAGNOSIS — M5032 Other cervical disc degeneration, mid-cervical region, unspecified level: Secondary | ICD-10-CM | POA: Diagnosis not present

## 2016-06-13 DIAGNOSIS — S29019A Strain of muscle and tendon of unspecified wall of thorax, initial encounter: Secondary | ICD-10-CM | POA: Diagnosis not present

## 2016-06-13 DIAGNOSIS — M5136 Other intervertebral disc degeneration, lumbar region: Secondary | ICD-10-CM | POA: Diagnosis not present

## 2016-06-13 DIAGNOSIS — M9903 Segmental and somatic dysfunction of lumbar region: Secondary | ICD-10-CM | POA: Diagnosis not present

## 2016-06-18 ENCOUNTER — Other Ambulatory Visit: Payer: Self-pay | Admitting: Internal Medicine

## 2016-06-20 DIAGNOSIS — M9901 Segmental and somatic dysfunction of cervical region: Secondary | ICD-10-CM | POA: Diagnosis not present

## 2016-06-20 DIAGNOSIS — M9903 Segmental and somatic dysfunction of lumbar region: Secondary | ICD-10-CM | POA: Diagnosis not present

## 2016-06-20 DIAGNOSIS — M5032 Other cervical disc degeneration, mid-cervical region, unspecified level: Secondary | ICD-10-CM | POA: Diagnosis not present

## 2016-06-20 DIAGNOSIS — S39012A Strain of muscle, fascia and tendon of lower back, initial encounter: Secondary | ICD-10-CM | POA: Diagnosis not present

## 2016-06-20 DIAGNOSIS — M5136 Other intervertebral disc degeneration, lumbar region: Secondary | ICD-10-CM | POA: Diagnosis not present

## 2016-06-20 DIAGNOSIS — S161XXA Strain of muscle, fascia and tendon at neck level, initial encounter: Secondary | ICD-10-CM | POA: Diagnosis not present

## 2016-06-20 DIAGNOSIS — S29019A Strain of muscle and tendon of unspecified wall of thorax, initial encounter: Secondary | ICD-10-CM | POA: Diagnosis not present

## 2016-06-20 DIAGNOSIS — M542 Cervicalgia: Secondary | ICD-10-CM | POA: Diagnosis not present

## 2016-06-20 DIAGNOSIS — M9902 Segmental and somatic dysfunction of thoracic region: Secondary | ICD-10-CM | POA: Diagnosis not present

## 2016-06-20 DIAGNOSIS — B370 Candidal stomatitis: Secondary | ICD-10-CM | POA: Diagnosis not present

## 2016-06-20 DIAGNOSIS — N760 Acute vaginitis: Secondary | ICD-10-CM | POA: Diagnosis not present

## 2016-07-02 ENCOUNTER — Telehealth: Payer: Self-pay | Admitting: Internal Medicine

## 2016-07-03 NOTE — Telephone Encounter (Signed)
Rx was phoned to pt pharmacy [id number] with 0 refill. Pt is aware.

## 2016-07-03 NOTE — Telephone Encounter (Signed)
Okay for refill?  

## 2016-07-03 NOTE — Telephone Encounter (Signed)
Pt would like to make sure you received the message that she scheduled followed up appt in order for her to get her refill  ALPRAZolam (XANAX) 0.25 MG tablet  [id number] - [place],  - [place]  Pt on the way home from [place].

## 2016-07-03 NOTE — Telephone Encounter (Signed)
Pt need new Rx for Xanax.  Pt has an appointment scheduled.

## 2016-07-03 NOTE — Telephone Encounter (Signed)
Pt last refill for Xanax 0.25 mg was filled on 03/13/2016, pt is requesting for refills, pt has an OV scheduled. Please Advise

## 2016-07-05 ENCOUNTER — Encounter: Payer: Self-pay | Admitting: Gastroenterology

## 2016-07-09 ENCOUNTER — Encounter: Payer: Self-pay | Admitting: Internal Medicine

## 2016-07-09 ENCOUNTER — Ambulatory Visit (INDEPENDENT_AMBULATORY_CARE_PROVIDER_SITE_OTHER): Payer: Medicare Other | Admitting: Internal Medicine

## 2016-07-09 ENCOUNTER — Ambulatory Visit: Payer: Self-pay | Admitting: Internal Medicine

## 2016-07-09 VITALS — BP 122/66 | HR 77 | Temp 97.7°F | Ht 61.0 in | Wt 128.4 lb

## 2016-07-09 DIAGNOSIS — R634 Abnormal weight loss: Secondary | ICD-10-CM

## 2016-07-09 DIAGNOSIS — F17200 Nicotine dependence, unspecified, uncomplicated: Secondary | ICD-10-CM | POA: Diagnosis not present

## 2016-07-09 DIAGNOSIS — F411 Generalized anxiety disorder: Secondary | ICD-10-CM | POA: Diagnosis not present

## 2016-07-09 DIAGNOSIS — E785 Hyperlipidemia, unspecified: Secondary | ICD-10-CM

## 2016-07-09 DIAGNOSIS — E876 Hypokalemia: Secondary | ICD-10-CM

## 2016-07-09 LAB — CBC WITH DIFFERENTIAL/PLATELET
BASOS PCT: 0.8 % (ref 0.0–3.0)
Basophils Absolute: 0.1 10*3/uL (ref 0.0–0.1)
EOS ABS: 0.3 10*3/uL (ref 0.0–0.7)
EOS PCT: 3.2 % (ref 0.0–5.0)
HCT: 41.1 % (ref 36.0–46.0)
HEMOGLOBIN: 14.2 g/dL (ref 12.0–15.0)
Lymphocytes Relative: 26.8 % (ref 12.0–46.0)
Lymphs Abs: 2.2 10*3/uL (ref 0.7–4.0)
MCHC: 34.5 g/dL (ref 30.0–36.0)
MCV: 86.8 fl (ref 78.0–100.0)
MONO ABS: 0.6 10*3/uL (ref 0.1–1.0)
Monocytes Relative: 7.5 % (ref 3.0–12.0)
NEUTROS ABS: 5.1 10*3/uL (ref 1.4–7.7)
Neutrophils Relative %: 61.7 % (ref 43.0–77.0)
PLATELETS: 334 10*3/uL (ref 150.0–400.0)
RBC: 4.73 Mil/uL (ref 3.87–5.11)
RDW: 14.2 % (ref 11.5–15.5)
WBC: 8.2 10*3/uL (ref 4.0–10.5)

## 2016-07-09 LAB — COMPREHENSIVE METABOLIC PANEL
ALBUMIN: 4.1 g/dL (ref 3.5–5.2)
ALT: 14 U/L (ref 0–35)
AST: 16 U/L (ref 0–37)
Alkaline Phosphatase: 42 U/L (ref 39–117)
BUN: 16 mg/dL (ref 6–23)
CHLORIDE: 96 meq/L (ref 96–112)
CO2: 32 meq/L (ref 19–32)
CREATININE: 0.81 mg/dL (ref 0.40–1.20)
Calcium: 10.2 mg/dL (ref 8.4–10.5)
GFR: 75.48 mL/min (ref >60.00)
GLUCOSE: 100 mg/dL — AB (ref 70–99)
POTASSIUM: 3.6 meq/L (ref 3.5–5.1)
SODIUM: 135 meq/L (ref 135–145)
Total Bilirubin: 0.2 mg/dL (ref 0.2–1.2)
Total Protein: 6.6 g/dL (ref 6.0–8.3)

## 2016-07-09 LAB — TSH: TSH: 1.58 u[IU]/mL (ref 0.35–4.50)

## 2016-07-09 MED ORDER — ALPRAZOLAM 0.25 MG PO TABS: 0.2500 mg | ORAL_TABLET | Freq: Three times a day (TID) | ORAL | 2 refills | Status: DC

## 2016-07-09 NOTE — Patient Instructions (Addendum)
WE NOW OFFER   [facility] FAST TRACK!!!  SAME DAY Appointments for ACUTE CARE  Such as: Sprains, Injuries, cuts, abrasions, rashes, muscle pain, joint pain, back pain Colds, flu, sore throats, headache, allergies, cough, fever  Ear pain, sinus and eye infections Abdominal pain, nausea, vomiting, diarrhea, upset stomach Animal/insect bites  3 Easy Ways to Schedule: Walk-In Scheduling Call in scheduling Mychart Sign-up: [email or web address]  Smoking tobacco is very bad for your health. You should stop smoking immediately.    It is important that you exercise regularly, at least 20 minutes 3 to 4 times per week.  If you develop chest pain or shortness of breath seek  medical attention.  Schedule your colonoscopy to help detect colon cancer.  Return in 6 months for follow-up

## 2016-07-09 NOTE — Progress Notes (Signed)
Subjective:    Patient ID: Allison Jimenez, female    DOB: Nov 12, 1950, 66 y.o.   MRN: [id number]  HPI  66 year old patient who is seen today for her six-month follow-up. She describes a general sense of unwellness and requests follow-up lab.  She lost her father in October of last year and just now completed estate settlement She continues to smoke but has cutback. She is scheduled for follow-up colonoscopy in September and anticipates cataract extraction surgery.  Also, later this summer or fall She has a history of anxiety disorder and Mnire's. She has dyslipidemia. She also has a history of migraines that have been stable.  Wt Readings from Last 3 Encounters:  07/09/16 128 lb 6.4 oz (58.2 kg)  12/12/15 132 lb (59.9 kg)  12/06/14 134 lb (60.8 kg)    Past Medical History:  Diagnosis Date  . ANXIETY 06/05/2006  . BACK PAIN 01/05/2010  . Cataract    bilateral, left worse  . DIVERTICULOSIS, COLON 06/05/2006  . DIZZINESS OR VERTIGO 06/05/2006   positional  . Hearing loss in left ear   . HYPERLIPIDEMIA 06/05/2006  . HYPOKALEMIA 12/14/2008  . MENIERE'S DISEASE 06/25/2006  . TOBACCO USER 11/25/2007     Social History   Social History  . Marital status: Married    Spouse name: N/A  . Number of children: 1  . Years of education: 22   Occupational History  .  [employer]    Retired   Social History Main Topics  . Smoking status: Current Some Day Smoker    Packs/day: 0.50    Types: Cigarettes  . Smokeless tobacco: Never Used     Comment: using electronic cigs.-12 cigs a day as well  . Alcohol use No  . Drug use: No  . Sexual activity: Not on file   Other Topics Concern  . Not on file   Social History Narrative   Patient lives at home alone. Patient has a Data processing manager. Patient is divorced .   Patient is retired.   Education high school.   Right handed.   Caffeine none.    Past Surgical History:  Procedure Laterality Date  . APPENDECTOMY    .  CARPAL TUNNEL RELEASE    . CESAREAN SECTION     x1  . CHOLECYSTECTOMY    . COLONOSCOPY    . COSMETIC SURGERY    . mastoid surgery     shunt in mastoid  . TUBAL LIGATION    . vocal cord surgery     nodule x2    Family History  Problem Relation Age of Onset  . Migraines Unknown   . Depression Unknown   . Anxiety disorder Unknown   . High Cholesterol Unknown   . Colon cancer Neg Hx   . Rectal cancer Neg Hx   . Stomach cancer Neg Hx     Allergies  Allergen Reactions  . Erythromycin Base   . Penicillins     Current Outpatient Prescriptions on File Prior to Visit  Medication Sig Dispense Refill  . ALPRAZolam (XANAX) 0.25 MG tablet take 1 tablet by mouth three times a day 90 tablet 2  . aspirin EC 325 MG tablet Take 325 mg by mouth daily.    . chlorthalidone (HYGROTON) 25 MG tablet take 1 tablet by mouth once daily 90 tablet 1  . FLUoxetine (PROZAC) 20 MG tablet take 2 tablets by mouth once daily 60 tablet 5  . meloxicam (MOBIC) 7.5 MG tablet  Take 1 tablet (7.5 mg total) by mouth daily as needed. 90 tablet 1  . potassium chloride 20 MEQ/15ML (10%) SOLN take 15 milliliters by mouth once daily 450 mL 1  . promethazine (PHENERGAN) 25 MG tablet take 1 tablet by mouth every 8 hours if needed 20 tablet 0  . rizatriptan (MAXALT) 10 MG tablet take 1 tablet by mouth if needed for MIGRAINE. MAY REPEAT IN 2 HOURS if needed 6 tablet 6   No current facility-administered medications on file prior to visit.     BP 122/66 (BP Location: Left Arm, Patient Position: Sitting, Cuff Size: Normal)   Pulse 77   Temp 97.7 F (36.5 C) (Oral)   Ht 5\' 1"  (1.549 m)   Wt 128 lb 6.4 oz (58.2 kg)   SpO2 99%   BMI 24.26 kg/m     Review of Systems  Constitutional: Negative.   HENT: Negative for congestion, dental problem, hearing loss, rhinorrhea, sinus pressure, sore throat and tinnitus.   Eyes: Negative for pain, discharge and visual disturbance.  Respiratory: Negative for cough and shortness of  breath.   Cardiovascular: Negative for chest pain, palpitations and leg swelling.  Gastrointestinal: Negative for abdominal distention, abdominal pain, blood in stool, constipation, diarrhea, nausea and vomiting.  Genitourinary: Negative for difficulty urinating, dysuria, flank pain, frequency, hematuria, pelvic pain, urgency, vaginal bleeding, vaginal discharge and vaginal pain.  Musculoskeletal: Positive for arthralgias. Negative for gait problem and joint swelling.       Bilateral shoulder pain  Skin: Negative for rash.  Neurological: Negative for dizziness, syncope, speech difficulty, weakness, numbness and headaches.  Hematological: Negative for adenopathy.  Psychiatric/Behavioral: Positive for dysphoric mood. Negative for agitation and behavioral problems. The patient is nervous/anxious.        Objective:   Physical Exam  Constitutional: She is oriented to person, place, and time. She appears well-developed and well-nourished. No distress.   Anxious Blood pressure low normal Thin  HENT:  Head: Normocephalic.  Right Ear: External ear normal.  Left Ear: External ear normal.  Mouth/Throat: Oropharynx is clear and moist.  Eyes: Conjunctivae and EOM are normal. Pupils are equal, round, and reactive to light.  Neck: Normal range of motion. Neck supple. No thyromegaly present.  Cardiovascular: Normal rate, regular rhythm, normal heart sounds and intact distal pulses.   Pulmonary/Chest: Effort normal and breath sounds normal.  A few scattered coarse rhonchi  O2 saturation 99  Abdominal: Soft. Bowel sounds are normal. She exhibits no mass. There is no tenderness.  Musculoskeletal: Normal range of motion.  Lymphadenopathy:    She has no cervical adenopathy.  Neurological: She is alert and oriented to person, place, and time.  Skin: Skin is warm and dry. No rash noted.  Psychiatric: She has a normal mood and affect. Her behavior is normal.          Assessment & Plan:    Weakness/fatigue Anxiety disorder Ongoing tobacco use  Migraine headaches, stable Mnire's disease  We'll check follow-up lab We'll continue  SSRI therapy  CPX 6 months  Manali Mcelmurry Pilar Plate

## 2016-07-12 DIAGNOSIS — S161XXA Strain of muscle, fascia and tendon at neck level, initial encounter: Secondary | ICD-10-CM | POA: Diagnosis not present

## 2016-07-12 DIAGNOSIS — M9901 Segmental and somatic dysfunction of cervical region: Secondary | ICD-10-CM | POA: Diagnosis not present

## 2016-07-12 DIAGNOSIS — M5136 Other intervertebral disc degeneration, lumbar region: Secondary | ICD-10-CM | POA: Diagnosis not present

## 2016-07-12 DIAGNOSIS — S29019A Strain of muscle and tendon of unspecified wall of thorax, initial encounter: Secondary | ICD-10-CM | POA: Diagnosis not present

## 2016-07-12 DIAGNOSIS — S39012A Strain of muscle, fascia and tendon of lower back, initial encounter: Secondary | ICD-10-CM | POA: Diagnosis not present

## 2016-07-12 DIAGNOSIS — M5032 Other cervical disc degeneration, mid-cervical region, unspecified level: Secondary | ICD-10-CM | POA: Diagnosis not present

## 2016-07-12 DIAGNOSIS — M9903 Segmental and somatic dysfunction of lumbar region: Secondary | ICD-10-CM | POA: Diagnosis not present

## 2016-07-12 DIAGNOSIS — M542 Cervicalgia: Secondary | ICD-10-CM | POA: Diagnosis not present

## 2016-07-12 DIAGNOSIS — M9902 Segmental and somatic dysfunction of thoracic region: Secondary | ICD-10-CM | POA: Diagnosis not present

## 2016-07-13 ENCOUNTER — Ambulatory Visit: Payer: Self-pay | Admitting: Internal Medicine

## 2016-07-13 ENCOUNTER — Encounter: Payer: Self-pay | Admitting: Internal Medicine

## 2016-07-23 DIAGNOSIS — M542 Cervicalgia: Secondary | ICD-10-CM | POA: Diagnosis not present

## 2016-07-23 DIAGNOSIS — S39012A Strain of muscle, fascia and tendon of lower back, initial encounter: Secondary | ICD-10-CM | POA: Diagnosis not present

## 2016-07-23 DIAGNOSIS — M9903 Segmental and somatic dysfunction of lumbar region: Secondary | ICD-10-CM | POA: Diagnosis not present

## 2016-07-23 DIAGNOSIS — M9902 Segmental and somatic dysfunction of thoracic region: Secondary | ICD-10-CM | POA: Diagnosis not present

## 2016-07-23 DIAGNOSIS — M9901 Segmental and somatic dysfunction of cervical region: Secondary | ICD-10-CM | POA: Diagnosis not present

## 2016-07-23 DIAGNOSIS — S29019A Strain of muscle and tendon of unspecified wall of thorax, initial encounter: Secondary | ICD-10-CM | POA: Diagnosis not present

## 2016-07-23 DIAGNOSIS — S161XXA Strain of muscle, fascia and tendon at neck level, initial encounter: Secondary | ICD-10-CM | POA: Diagnosis not present

## 2016-07-23 DIAGNOSIS — M5032 Other cervical disc degeneration, mid-cervical region, unspecified level: Secondary | ICD-10-CM | POA: Diagnosis not present

## 2016-07-23 DIAGNOSIS — M5136 Other intervertebral disc degeneration, lumbar region: Secondary | ICD-10-CM | POA: Diagnosis not present

## 2016-08-06 DIAGNOSIS — S39012A Strain of muscle, fascia and tendon of lower back, initial encounter: Secondary | ICD-10-CM | POA: Diagnosis not present

## 2016-08-06 DIAGNOSIS — M5136 Other intervertebral disc degeneration, lumbar region: Secondary | ICD-10-CM | POA: Diagnosis not present

## 2016-08-06 DIAGNOSIS — M542 Cervicalgia: Secondary | ICD-10-CM | POA: Diagnosis not present

## 2016-08-06 DIAGNOSIS — M9901 Segmental and somatic dysfunction of cervical region: Secondary | ICD-10-CM | POA: Diagnosis not present

## 2016-08-06 DIAGNOSIS — S161XXA Strain of muscle, fascia and tendon at neck level, initial encounter: Secondary | ICD-10-CM | POA: Diagnosis not present

## 2016-08-06 DIAGNOSIS — S29019A Strain of muscle and tendon of unspecified wall of thorax, initial encounter: Secondary | ICD-10-CM | POA: Diagnosis not present

## 2016-08-06 DIAGNOSIS — M9902 Segmental and somatic dysfunction of thoracic region: Secondary | ICD-10-CM | POA: Diagnosis not present

## 2016-08-06 DIAGNOSIS — M9903 Segmental and somatic dysfunction of lumbar region: Secondary | ICD-10-CM | POA: Diagnosis not present

## 2016-08-06 DIAGNOSIS — M5032 Other cervical disc degeneration, mid-cervical region, unspecified level: Secondary | ICD-10-CM | POA: Diagnosis not present

## 2016-08-07 DIAGNOSIS — H35413 Lattice degeneration of retina, bilateral: Secondary | ICD-10-CM | POA: Diagnosis not present

## 2016-08-07 DIAGNOSIS — H43813 Vitreous degeneration, bilateral: Secondary | ICD-10-CM | POA: Diagnosis not present

## 2016-08-15 DIAGNOSIS — H10413 Chronic giant papillary conjunctivitis, bilateral: Secondary | ICD-10-CM | POA: Diagnosis not present

## 2016-08-15 DIAGNOSIS — H04223 Epiphora due to insufficient drainage, bilateral lacrimal glands: Secondary | ICD-10-CM | POA: Diagnosis not present

## 2016-08-15 DIAGNOSIS — H04123 Dry eye syndrome of bilateral lacrimal glands: Secondary | ICD-10-CM | POA: Diagnosis not present

## 2016-08-15 DIAGNOSIS — H25813 Combined forms of age-related cataract, bilateral: Secondary | ICD-10-CM | POA: Diagnosis not present

## 2016-08-20 DIAGNOSIS — S161XXA Strain of muscle, fascia and tendon at neck level, initial encounter: Secondary | ICD-10-CM | POA: Diagnosis not present

## 2016-08-20 DIAGNOSIS — M9901 Segmental and somatic dysfunction of cervical region: Secondary | ICD-10-CM | POA: Diagnosis not present

## 2016-08-20 DIAGNOSIS — H9042 Sensorineural hearing loss, unilateral, left ear, with unrestricted hearing on the contralateral side: Secondary | ICD-10-CM | POA: Diagnosis not present

## 2016-08-20 DIAGNOSIS — M542 Cervicalgia: Secondary | ICD-10-CM | POA: Diagnosis not present

## 2016-08-20 DIAGNOSIS — M5032 Other cervical disc degeneration, mid-cervical region, unspecified level: Secondary | ICD-10-CM | POA: Diagnosis not present

## 2016-08-20 DIAGNOSIS — S29019A Strain of muscle and tendon of unspecified wall of thorax, initial encounter: Secondary | ICD-10-CM | POA: Diagnosis not present

## 2016-08-20 DIAGNOSIS — M5136 Other intervertebral disc degeneration, lumbar region: Secondary | ICD-10-CM | POA: Diagnosis not present

## 2016-08-20 DIAGNOSIS — M9902 Segmental and somatic dysfunction of thoracic region: Secondary | ICD-10-CM | POA: Diagnosis not present

## 2016-08-20 DIAGNOSIS — H9312 Tinnitus, left ear: Secondary | ICD-10-CM | POA: Diagnosis not present

## 2016-08-20 DIAGNOSIS — S39012A Strain of muscle, fascia and tendon of lower back, initial encounter: Secondary | ICD-10-CM | POA: Diagnosis not present

## 2016-08-20 DIAGNOSIS — J3081 Allergic rhinitis due to animal (cat) (dog) hair and dander: Secondary | ICD-10-CM | POA: Diagnosis not present

## 2016-08-20 DIAGNOSIS — J301 Allergic rhinitis due to pollen: Secondary | ICD-10-CM | POA: Diagnosis not present

## 2016-08-20 DIAGNOSIS — M9903 Segmental and somatic dysfunction of lumbar region: Secondary | ICD-10-CM | POA: Diagnosis not present

## 2016-08-28 ENCOUNTER — Ambulatory Visit (AMBULATORY_SURGERY_CENTER): Payer: Self-pay | Admitting: *Deleted

## 2016-08-28 VITALS — Ht 60.5 in | Wt 130.6 lb

## 2016-08-28 DIAGNOSIS — Z1211 Encounter for screening for malignant neoplasm of colon: Secondary | ICD-10-CM

## 2016-08-28 MED ORDER — NA SULFATE-K SULFATE-MG SULF 17.5-3.13-1.6 GM/177ML PO SOLN: 1.0000 | Freq: Once | ORAL | 0 refills | Status: AC

## 2016-08-28 NOTE — Progress Notes (Signed)
No egg or soy allergy known to patient  No issues with past sedation with any surgeries  or procedures, no intubation problems  No diet pills per patient No home 02 use per patient  No blood thinners per patient  Pt denies issues with constipation - eats a lot of fiber  No A fib or A flutter  EMMI video sent to pt's e mail - pt declined

## 2016-09-04 ENCOUNTER — Telehealth: Payer: Self-pay | Admitting: Gastroenterology

## 2016-09-04 NOTE — Telephone Encounter (Signed)
Attempted pt again- left message to call [phone] and ask for pre visit- Lenard Galloway RN

## 2016-09-04 NOTE — Telephone Encounter (Signed)
Spoke with pt. Due to insurance not accepting the coupon and the Suprep will cost over $93.00 per pt, the pt will come by the office to pick up a sample of Suprep on Thursday this week. Suprep lot # [id number], Exp 06/20 will be left at the front desk on the 4th floor. Pt aware.

## 2016-09-04 NOTE — Telephone Encounter (Signed)
Called pt back on her cell phone and left message to call our office regarding the Krebs. Allison Jimenez

## 2016-09-11 ENCOUNTER — Encounter: Payer: Self-pay | Admitting: Gastroenterology

## 2016-09-11 DIAGNOSIS — H52221 Regular astigmatism, right eye: Secondary | ICD-10-CM | POA: Diagnosis not present

## 2016-09-11 DIAGNOSIS — H25811 Combined forms of age-related cataract, right eye: Secondary | ICD-10-CM | POA: Diagnosis not present

## 2016-09-18 DIAGNOSIS — H25812 Combined forms of age-related cataract, left eye: Secondary | ICD-10-CM | POA: Diagnosis not present

## 2016-09-18 DIAGNOSIS — H268 Other specified cataract: Secondary | ICD-10-CM | POA: Diagnosis not present

## 2016-09-18 DIAGNOSIS — H52202 Unspecified astigmatism, left eye: Secondary | ICD-10-CM | POA: Diagnosis not present

## 2016-09-20 ENCOUNTER — Encounter: Payer: Self-pay | Admitting: Internal Medicine

## 2016-10-12 ENCOUNTER — Other Ambulatory Visit: Payer: Self-pay | Admitting: Internal Medicine

## 2016-10-28 DIAGNOSIS — Z23 Encounter for immunization: Secondary | ICD-10-CM | POA: Diagnosis not present

## 2016-10-31 ENCOUNTER — Telehealth: Payer: Self-pay | Admitting: Gastroenterology

## 2016-10-31 DIAGNOSIS — H04222 Epiphora due to insufficient drainage, left lacrimal gland: Secondary | ICD-10-CM | POA: Diagnosis not present

## 2016-10-31 DIAGNOSIS — H04322 Acute dacryocystitis of left lacrimal passage: Secondary | ICD-10-CM | POA: Diagnosis not present

## 2016-10-31 DIAGNOSIS — H5712 Ocular pain, left eye: Secondary | ICD-10-CM | POA: Diagnosis not present

## 2016-10-31 NOTE — Telephone Encounter (Signed)
Spoke with pt and told her to take Keflex as directed and it will not interfere with our procedure. Understanding voiced.

## 2016-11-06 ENCOUNTER — Ambulatory Visit (AMBULATORY_SURGERY_CENTER): Payer: Medicare Other | Admitting: Gastroenterology

## 2016-11-06 ENCOUNTER — Encounter: Payer: Self-pay | Admitting: Gastroenterology

## 2016-11-06 VITALS — BP 108/57 | HR 51 | Temp 97.5°F | Resp 10 | Ht 66.0 in | Wt 130.0 lb

## 2016-11-06 DIAGNOSIS — K635 Polyp of colon: Secondary | ICD-10-CM | POA: Diagnosis not present

## 2016-11-06 DIAGNOSIS — K621 Rectal polyp: Secondary | ICD-10-CM | POA: Diagnosis not present

## 2016-11-06 DIAGNOSIS — D128 Benign neoplasm of rectum: Secondary | ICD-10-CM

## 2016-11-06 DIAGNOSIS — I10 Essential (primary) hypertension: Secondary | ICD-10-CM | POA: Diagnosis not present

## 2016-11-06 DIAGNOSIS — Z1211 Encounter for screening for malignant neoplasm of colon: Secondary | ICD-10-CM

## 2016-11-06 DIAGNOSIS — D129 Benign neoplasm of anus and anal canal: Secondary | ICD-10-CM

## 2016-11-06 DIAGNOSIS — Z1212 Encounter for screening for malignant neoplasm of rectum: Secondary | ICD-10-CM | POA: Diagnosis not present

## 2016-11-06 MED ORDER — NON FORMULARY: 1.0000 "application " | Freq: Three times a day (TID) | Status: DC

## 2016-11-06 MED ORDER — SODIUM CHLORIDE 0.9 % IV SOLN: 500.0000 mL | INTRAVENOUS | Status: DC

## 2016-11-06 MED ORDER — AMBULATORY NON FORMULARY MEDICATION: 0 refills | Status: DC

## 2016-11-06 NOTE — Progress Notes (Signed)
Report to PACU, RN, vss, BBS= Clear.  

## 2016-11-06 NOTE — Patient Instructions (Signed)
Handout given on polyps  YOU HAD AN ENDOSCOPIC PROCEDURE TODAY: Refer to the procedure report and other information in the discharge instructions given to you for any specific questions about what was found during the examination. If this information does not answer your questions, please call [facility] office at [phone] to clarify.   YOU SHOULD EXPECT: Some feelings of bloating in the abdomen. Passage of more gas than usual. Walking can help get rid of the air that was put into your GI tract during the procedure and reduce the bloating. If you had a lower endoscopy (such as a colonoscopy or flexible sigmoidoscopy) you may notice spotting of blood in your stool or on the toilet paper. Some abdominal soreness may be present for a day or two, also.  DIET: Your first meal following the procedure should be a light meal and then it is ok to progress to your normal diet. A half-sandwich or bowl of soup is an example of a good first meal. Heavy or fried foods are harder to digest and may make you feel nauseous or bloated. Drink plenty of fluids but you should avoid alcoholic beverages for 24 hours. If you had a esophageal dilation, please see attached instructions for diet.    ACTIVITY: Your care partner should take you home directly after the procedure. You should plan to take it easy, moving slowly for the rest of the day. You can resume normal activity the day after the procedure however YOU SHOULD NOT DRIVE, use power tools, machinery or perform tasks that involve climbing or major physical exertion for 24 hours (because of the sedation medicines used during the test).   SYMPTOMS TO REPORT IMMEDIATELY: A gastroenterologist can be reached at any hour. Please call [phone]  for any of the following symptoms:  Following lower endoscopy (colonoscopy, flexible sigmoidoscopy) Excessive amounts of blood in the stool  Significant tenderness, worsening of abdominal pains  Swelling of the abdomen that is  new, acute  Fever of 100 or higher    FOLLOW UP:  If any biopsies were taken you will be contacted by phone or by letter within the next 1-3 weeks. Call [phone]  if you have not heard about the biopsies in 3 weeks.  Please also call with any specific questions about appointments or follow up tests.  

## 2016-11-06 NOTE — Op Note (Signed)
[place] Patient Name: Allison Jimenez Procedure Date: 11/06/2016 10:39 AM MRN: [id number] Endoscopist: Remo Lipps P. Tyreshia Ingman MD, MD Age: 66 Referring MD:  Date of Birth: [birth year] Gender: Female Account #: [id number] Procedure:                Colonoscopy Indications:              Screening for colorectal malignant neoplasm Medicines:                Monitored Anesthesia Care Procedure:                Pre-Anesthesia Assessment:                           - Prior to the procedure, a History and Physical                            was performed, and patient medications and                            allergies were reviewed. The patient's tolerance of                            previous anesthesia was also reviewed. The risks                            and benefits of the procedure and the sedation                            options and risks were discussed with the patient.                            All questions were answered, and informed consent                            was obtained. Prior Anticoagulants: The patient has                            taken no previous anticoagulant or antiplatelet                            agents. ASA Grade Assessment: II - A patient with                            mild systemic disease. After reviewing the risks                            and benefits, the patient was deemed in                            satisfactory condition to undergo the procedure.                           After obtaining informed consent, the colonoscope  was passed under direct vision. Throughout the                            procedure, the patient's blood pressure, pulse, and                            oxygen saturations were monitored continuously. The                            Model PCF-H190DL [id number]) scope was introduced                            through the anus and advanced to the the cecum,   identified by appendiceal orifice and ileocecal                            valve. The colonoscopy was performed without                            difficulty. The patient tolerated the procedure                            well. The quality of the bowel preparation was                            adequate. The ileocecal valve, appendiceal orifice,                            and rectum were photographed. Scope In: 10:47:12 AM Scope Out: 11:03:38 AM Scope Withdrawal Time: 0 hours 13 minutes 19 seconds  Total Procedure Duration: 0 hours 16 minutes 26 seconds  Findings:                 The perianal exam findings include anal fissure and                            a skin tag.                           A 3 mm polyp was found in the rectum. The polyp was                            sessile. The polyp was removed with a cold snare.                            Resection and retrieval were complete.                           Internal hemorrhoids were found during                            retroflexion. The hemorrhoids were small.                           The exam was otherwise without abnormality.  Complications:            No immediate complications. Estimated blood loss:                            Minimal. Estimated Blood Loss:     Estimated blood loss was minimal. Impression:               - Anal fissure and perianal skin tag found on                            perianal exam.                           - One 3 mm polyp in the rectum, removed with a cold                            snare. Resected and retrieved.                           - Internal hemorrhoids.                           - The examination was otherwise normal. Recommendation:           - Patient has a contact number available for                            emergencies. The signs and symptoms of potential                            delayed complications were discussed with the                            patient. Return to normal  activities tomorrow.                            Written discharge instructions were provided to the                            patient.                           - Resume previous diet.                           - Continue present medications.                           - Topical 0.125% nitroglycerin ointment - pea sized                            amount 2-3 times daily for anal fissure                           - Await pathology results.                           -  Repeat colonoscopy is recommended for                            surveillance. The colonoscopy date will be                            determined after pathology results from today's                            exam become available for review. Remo Lipps P. Zienna Ahlin MD, MD 11/06/2016 11:07:27 AM This report has been signed electronically.

## 2016-11-06 NOTE — Progress Notes (Signed)
Called to room to assist during endoscopic procedure.  Patient ID and intended procedure confirmed with present staff. Received instructions for my participation in the procedure from the performing physician.  

## 2016-11-07 ENCOUNTER — Telehealth: Payer: Self-pay

## 2016-11-07 NOTE — Telephone Encounter (Signed)
  Follow up Call-  Call back number 11/06/2016  Post procedure Call Back phone  # [phone]  Permission to leave phone message Yes  Some recent data might be hidden     Patient questions:  Do you have a fever, pain , or abdominal swelling? No. Pain Score  0 *  Have you tolerated food without any problems? Yes.    Have you been able to return to your normal activities? Yes.    Do you have any questions about your discharge instructions: Diet   No. Medications  No. Follow up visit  No.  Do you have questions or concerns about your Care? No.  Actions: * If pain score is 4 or above: No action needed, pain <4.  No problems noted per pt. maw

## 2016-11-08 DIAGNOSIS — H04223 Epiphora due to insufficient drainage, bilateral lacrimal glands: Secondary | ICD-10-CM | POA: Diagnosis not present

## 2016-11-09 ENCOUNTER — Encounter: Payer: Self-pay | Admitting: Gastroenterology

## 2016-11-13 DIAGNOSIS — Z01419 Encounter for gynecological examination (general) (routine) without abnormal findings: Secondary | ICD-10-CM | POA: Diagnosis not present

## 2016-11-13 DIAGNOSIS — Z6824 Body mass index (BMI) 24.0-24.9, adult: Secondary | ICD-10-CM | POA: Diagnosis not present

## 2016-11-13 DIAGNOSIS — N760 Acute vaginitis: Secondary | ICD-10-CM | POA: Diagnosis not present

## 2016-11-13 DIAGNOSIS — Z1231 Encounter for screening mammogram for malignant neoplasm of breast: Secondary | ICD-10-CM | POA: Diagnosis not present

## 2016-11-26 DIAGNOSIS — H04221 Epiphora due to insufficient drainage, right lacrimal gland: Secondary | ICD-10-CM | POA: Diagnosis not present

## 2016-11-26 DIAGNOSIS — H04222 Epiphora due to insufficient drainage, left lacrimal gland: Secondary | ICD-10-CM | POA: Diagnosis not present

## 2016-11-29 DIAGNOSIS — M7062 Trochanteric bursitis, left hip: Secondary | ICD-10-CM | POA: Diagnosis not present

## 2016-11-29 DIAGNOSIS — M5136 Other intervertebral disc degeneration, lumbar region: Secondary | ICD-10-CM | POA: Diagnosis not present

## 2016-12-03 DIAGNOSIS — H04222 Epiphora due to insufficient drainage, left lacrimal gland: Secondary | ICD-10-CM | POA: Diagnosis not present

## 2016-12-03 DIAGNOSIS — H1032 Unspecified acute conjunctivitis, left eye: Secondary | ICD-10-CM | POA: Diagnosis not present

## 2016-12-03 DIAGNOSIS — H02105 Unspecified ectropion of left lower eyelid: Secondary | ICD-10-CM | POA: Diagnosis not present

## 2016-12-05 ENCOUNTER — Other Ambulatory Visit: Payer: Self-pay | Admitting: Internal Medicine

## 2016-12-11 ENCOUNTER — Other Ambulatory Visit: Payer: Self-pay | Admitting: Internal Medicine

## 2016-12-13 ENCOUNTER — Telehealth: Payer: Self-pay | Admitting: Internal Medicine

## 2016-12-13 ENCOUNTER — Encounter: Payer: Self-pay | Admitting: Internal Medicine

## 2016-12-13 NOTE — Telephone Encounter (Signed)
Pt  Requesting   A  Refill   Of  Xanax

## 2016-12-13 NOTE — Telephone Encounter (Signed)
Copied from [place]. Topic: Quick Communication - See Telephone Encounter >> Dec 13, 2016 10:17 AM Synthia Innocent wrote: CRM for notification. See Telephone encounter for:  Requesting refill on xanax, she has contacted pharmacy, [facility] [phone]. Patient would like a call once sent 12/13/16.

## 2016-12-13 NOTE — Telephone Encounter (Signed)
Spoke with pt, she "just picked up medication" from the pharmacy.

## 2017-01-15 DIAGNOSIS — J039 Acute tonsillitis, unspecified: Secondary | ICD-10-CM | POA: Diagnosis not present

## 2017-01-15 DIAGNOSIS — J322 Chronic ethmoidal sinusitis: Secondary | ICD-10-CM | POA: Diagnosis not present

## 2017-01-15 DIAGNOSIS — H6122 Impacted cerumen, left ear: Secondary | ICD-10-CM | POA: Diagnosis not present

## 2017-01-15 DIAGNOSIS — J320 Chronic maxillary sinusitis: Secondary | ICD-10-CM | POA: Diagnosis not present

## 2017-01-17 ENCOUNTER — Other Ambulatory Visit: Payer: Self-pay | Admitting: Internal Medicine

## 2017-01-24 ENCOUNTER — Encounter: Payer: Self-pay | Admitting: Internal Medicine

## 2017-01-24 ENCOUNTER — Ambulatory Visit (INDEPENDENT_AMBULATORY_CARE_PROVIDER_SITE_OTHER): Payer: Medicare Other | Admitting: Internal Medicine

## 2017-01-24 VITALS — BP 100/60 | HR 73 | Temp 97.9°F | Ht 60.05 in | Wt 127.8 lb

## 2017-01-24 DIAGNOSIS — F17200 Nicotine dependence, unspecified, uncomplicated: Secondary | ICD-10-CM | POA: Diagnosis not present

## 2017-01-24 DIAGNOSIS — H90A32 Mixed conductive and sensorineural hearing loss, unilateral, left ear with restricted hearing on the contralateral side: Secondary | ICD-10-CM | POA: Insufficient documentation

## 2017-01-24 DIAGNOSIS — Z1159 Encounter for screening for other viral diseases: Secondary | ICD-10-CM | POA: Diagnosis not present

## 2017-01-24 DIAGNOSIS — Z0000 Encounter for general adult medical examination without abnormal findings: Secondary | ICD-10-CM | POA: Diagnosis not present

## 2017-01-24 DIAGNOSIS — E785 Hyperlipidemia, unspecified: Secondary | ICD-10-CM

## 2017-01-24 DIAGNOSIS — F411 Generalized anxiety disorder: Secondary | ICD-10-CM

## 2017-01-24 DIAGNOSIS — Z23 Encounter for immunization: Secondary | ICD-10-CM | POA: Diagnosis not present

## 2017-01-24 DIAGNOSIS — H8103 Meniere's disease, bilateral: Secondary | ICD-10-CM | POA: Insufficient documentation

## 2017-01-24 DIAGNOSIS — H9312 Tinnitus, left ear: Secondary | ICD-10-CM | POA: Insufficient documentation

## 2017-01-24 LAB — LIPID PANEL
CHOLESTEROL: 240 mg/dL — AB (ref 0–200)
HDL: 58.8 mg/dL (ref >39.00)
LDL Cholesterol: 160 mg/dL — ABNORMAL HIGH (ref 0–99)
NonHDL: 180.87
TRIGLYCERIDES: 104 mg/dL (ref 0.0–149.0)
Total CHOL/HDL Ratio: 4
VLDL: 20.8 mg/dL (ref 0.0–40.0)

## 2017-01-24 NOTE — Patient Instructions (Addendum)
Take a calcium supplement, plus [phone] units of vitamin D    It is important that you exercise regularly, at least 20 minutes 3 to 4 times per week.  If you develop chest pain or shortness of breath seek  medical attention.  Smoking tobacco is very bad for your health. You should stop smoking immediately.  Return in 6 months for follow-up   Health Maintenance for Postmenopausal Women Menopause is a normal process in which your reproductive ability comes to an end. This process happens gradually over a span of months to years, usually between the ages of 37 and 6. Menopause is complete when you have missed 12 consecutive menstrual periods. It is important to talk with your health care provider about some of the most common conditions that affect postmenopausal women, such as heart disease, cancer, and bone loss (osteoporosis). Adopting a healthy lifestyle and getting preventive care can help to promote your health and wellness. Those actions can also lower your chances of developing some of these common conditions. What should I know about menopause? During menopause, you may experience a number of symptoms, such as:  Moderate-to-severe hot flashes.  Night sweats.  Decrease in sex drive.  Mood swings.  Headaches.  Tiredness.  Irritability.  Memory problems.  Insomnia.  Choosing to treat or not to treat menopausal changes is an individual decision that you make with your health care provider. What should I know about hormone replacement therapy and supplements? Hormone therapy products are effective for treating symptoms that are associated with menopause, such as hot flashes and night sweats. Hormone replacement carries certain risks, especially as you become older. If you are thinking about using estrogen or estrogen with progestin treatments, discuss the benefits and risks with your health care provider. What should I know about heart disease and stroke? Heart disease, heart  attack, and stroke become more likely as you age. This may be due, in part, to the hormonal changes that your body experiences during menopause. These can affect how your body processes dietary fats, triglycerides, and cholesterol. Heart attack and stroke are both medical emergencies. There are many things that you can do to help prevent heart disease and stroke:  Have your blood pressure checked at least every 1-2 years. High blood pressure causes heart disease and increases the risk of stroke.  If you are 6-39 years old, ask your health care provider if you should take aspirin to prevent a heart attack or a stroke.  Do not use any tobacco products, including cigarettes, chewing tobacco, or electronic cigarettes. If you need help quitting, ask your health care provider.  It is important to eat a healthy diet and maintain a healthy weight. ? Be sure to include plenty of vegetables, fruits, low-fat dairy products, and lean protein. ? Avoid eating foods that are high in solid fats, added sugars, or salt (sodium).  Get regular exercise. This is one of the most important things that you can do for your health. ? Try to exercise for at least 150 minutes each week. The type of exercise that you do should increase your heart rate and make you sweat. This is known as moderate-intensity exercise. ? Try to do strengthening exercises at least twice each week. Do these in addition to the moderate-intensity exercise.  Know your numbers.Ask your health care provider to check your cholesterol and your blood glucose. Continue to have your blood tested as directed by your health care provider.  What should I know about cancer screening?  There are several types of cancer. Take the following steps to reduce your risk and to catch any cancer development as early as possible. Breast Cancer  Practice breast self-awareness. ? This means understanding how your breasts normally appear and feel. ? It also means  doing regular breast self-exams. Let your health care provider know about any changes, no matter how small.  If you are 64 or older, have a clinician do a breast exam (clinical breast exam or CBE) every year. Depending on your age, family history, and medical history, it may be recommended that you also have a yearly breast X-ray (mammogram).  If you have a family history of breast cancer, talk with your health care provider about genetic screening.  If you are at high risk for breast cancer, talk with your health care provider about having an MRI and a mammogram every year.  Breast cancer (BRCA) gene test is recommended for women who have family members with BRCA-related cancers. Results of the assessment will determine the need for genetic counseling and BRCA1 and for BRCA2 testing. BRCA-related cancers include these types: ? Breast. This occurs in males or females. ? Ovarian. ? Tubal. This may also be called fallopian tube cancer. ? Cancer of the abdominal or pelvic lining (peritoneal cancer). ? Prostate. ? Pancreatic.  Cervical, Uterine, and Ovarian Cancer Your health care provider may recommend that you be screened regularly for cancer of the pelvic organs. These include your ovaries, uterus, and vagina. This screening involves a pelvic exam, which includes checking for microscopic changes to the surface of your cervix (Pap test).  For women ages 21-65, health care providers may recommend a pelvic exam and a Pap test every three years. For women ages 38-65, they may recommend the Pap test and pelvic exam, combined with testing for human papilloma virus (HPV), every five years. Some types of HPV increase your risk of cervical cancer. Testing for HPV may also be done on women of any age who have unclear Pap test results.  Other health care providers may not recommend any screening for nonpregnant women who are considered low risk for pelvic cancer and have no symptoms. Ask your health care  provider if a screening pelvic exam is right for you.  If you have had past treatment for cervical cancer or a condition that could lead to cancer, you need Pap tests and screening for cancer for at least 20 years after your treatment. If Pap tests have been discontinued for you, your risk factors (such as having a new sexual partner) need to be reassessed to determine if you should start having screenings again. Some women have medical problems that increase the chance of getting cervical cancer. In these cases, your health care provider may recommend that you have screening and Pap tests more often.  If you have a family history of uterine cancer or ovarian cancer, talk with your health care provider about genetic screening.  If you have vaginal bleeding after reaching menopause, tell your health care provider.  There are currently no reliable tests available to screen for ovarian cancer.  Lung Cancer Lung cancer screening is recommended for adults 47-36 years old who are at high risk for lung cancer because of a history of smoking. A yearly low-dose CT scan of the lungs is recommended if you:  Currently smoke.  Have a history of at least 30 pack-years of smoking and you currently smoke or have quit within the past 15 years. A pack-year is smoking an  average of one pack of cigarettes per day for one year.  Yearly screening should:  Continue until it has been 15 years since you quit.  Stop if you develop a health problem that would prevent you from having lung cancer treatment.  Colorectal Cancer  This type of cancer can be detected and can often be prevented.  Routine colorectal cancer screening usually begins at age 9 and continues through age 18.  If you have risk factors for colon cancer, your health care provider may recommend that you be screened at an earlier age.  If you have a family history of colorectal cancer, talk with your health care provider about genetic  screening.  Your health care provider may also recommend using home test kits to check for hidden blood in your stool.  A small camera at the end of a tube can be used to examine your colon directly (sigmoidoscopy or colonoscopy). This is done to check for the earliest forms of colorectal cancer.  Direct examination of the colon should be repeated every 5-10 years until age 70. However, if early forms of precancerous polyps or small growths are found or if you have a family history or genetic risk for colorectal cancer, you may need to be screened more often.  Skin Cancer  Check your skin from head to toe regularly.  Monitor any moles. Be sure to tell your health care provider: ? About any new moles or changes in moles, especially if there is a change in a mole's shape or color. ? If you have a mole that is larger than the size of a pencil eraser.  If any of your family members has a history of skin cancer, especially at a young age, talk with your health care provider about genetic screening.  Always use sunscreen. Apply sunscreen liberally and repeatedly throughout the day.  Whenever you are outside, protect yourself by wearing long sleeves, pants, a wide-brimmed hat, and sunglasses.  What should I know about osteoporosis? Osteoporosis is a condition in which bone destruction happens more quickly than new bone creation. After menopause, you may be at an increased risk for osteoporosis. To help prevent osteoporosis or the bone fractures that can happen because of osteoporosis, the following is recommended:  If you are 76-9 years old, get at least 1,000 mg of calcium and at least 600 mg of vitamin D per day.  If you are older than age 45 but younger than age 75, get at least 1,200 mg of calcium and at least 600 mg of vitamin D per day.  If you are older than age 39, get at least 1,200 mg of calcium and at least 800 mg of vitamin D per day.  Smoking and excessive alcohol intake  increase the risk of osteoporosis. Eat foods that are rich in calcium and vitamin D, and do weight-bearing exercises several times each week as directed by your health care provider. What should I know about how menopause affects my mental health? Depression may occur at any age, but it is more common as you become older. Common symptoms of depression include:  Low or sad mood.  Changes in sleep patterns.  Changes in appetite or eating patterns.  Feeling an overall lack of motivation or enjoyment of activities that you previously enjoyed.  Frequent crying spells.  Talk with your health care provider if you think that you are experiencing depression. What should I know about immunizations? It is important that you get and maintain your immunizations.  These include:  Tetanus, diphtheria, and pertussis (Tdap) booster vaccine.  Influenza every year before the flu season begins.  Pneumonia vaccine.  Shingles vaccine.  Your health care provider may also recommend other immunizations. This information is not intended to replace advice given to you by your health care provider. Make sure you discuss any questions you have with your health care provider. Document Released: 02/09/2005 Document Revised: 07/08/2015 Document Reviewed: 09/21/2014 Elsevier Interactive Patient Education  2018 [employer].

## 2017-01-24 NOTE — Progress Notes (Signed)
Subjective:    Patient ID: Allison Jimenez, female    DOB: 1950-10-20, 67 y.o.   MRN: [id number]  HPI  67 year old patient who is seen today for a annual follow-up visit as well as a subsequent Medicare wellness visit.  She has a history of migraine headaches Which have not been well controlled.  She takes aspirin almost daily. She is followed by ENT with a history of Mnire's.  Colonoscopy November 2018.  She also had bilateral cataract extraction surgery. She continues to be a 1/2 pack/day smoker.  Laboratory studies were checked in the summer except for a lipid profile.  Past Medical History:  Diagnosis Date  . Allergy   . ANXIETY 06/05/2006  . Arthritis    SHOULDERS   . BACK PAIN 01/05/2010  . Cataract    bilateral, left worse  . DIVERTICULOSIS, COLON 06/05/2006  . DIZZINESS OR VERTIGO 06/05/2006   positional  . Hearing loss in left ear   . HYPERLIPIDEMIA 06/05/2006  . HYPOKALEMIA 12/14/2008  . MENIERE'S DISEASE 06/25/2006  . TOBACCO USER 11/25/2007     Social History   Socioeconomic History  . Marital status: Married    Spouse name: Not on file  . Number of children: 1  . Years of education: 57  . Highest education level: Not on file  Social Needs  . Financial resource strain: Not on file  . Food insecurity - worry: Not on file  . Food insecurity - inability: Not on file  . Transportation needs - medical: Not on file  . Transportation needs - non-medical: Not on file  Occupational History    Employer: [employer]    Comment: Retired  Tobacco Use  . Smoking status: Current Some Day Smoker    Packs/day: 0.50    Types: Cigarettes  . Smokeless tobacco: Never Used  . Tobacco comment: using electronic cigs.-12 cigs a day as well  Substance and Sexual Activity  . Alcohol use: No    Alcohol/week: 0.0 oz  . Drug use: No  . Sexual activity: Not on file  Other Topics Concern  . Not on file  Social History Narrative   Patient lives at home alone. Patient  has a Data processing manager. Patient is divorced .   Patient is retired.   Education high school.   Right handed.   Caffeine none.    Past Surgical History:  Procedure Laterality Date  . APPENDECTOMY    . CESAREAN SECTION     x1  . CHOLECYSTECTOMY    . COLONOSCOPY    . COSMETIC SURGERY    . mastoid surgery  1992   shunt in mastoid- endolymphatic sac in left ear   . TUBAL LIGATION    . vocal cord surgery     nodule x2    Family History  Problem Relation Age of Onset  . Breast cancer Mother   . Migraines Unknown   . Depression Unknown   . Anxiety disorder Unknown   . High Cholesterol Unknown   . Colon cancer Neg Hx   . Rectal cancer Neg Hx   . Stomach cancer Neg Hx   . Colon polyps Neg Hx   . Esophageal cancer Neg Hx     Allergies  Allergen Reactions  . Erythromycin Base Diarrhea  . Penicillins     Current Outpatient Medications on File Prior to Visit  Medication Sig Dispense Refill  . ALPRAZolam (XANAX) 0.25 MG tablet take 1 tablet by mouth three times a  day 90 tablet 0  . AMBULATORY NON FORMULARY MEDICATION Medication Name: Nitro gel 0.125%- apply pea size amount 2-3 times daily for anal fissure 30 g 0  . aspirin EC 325 MG tablet Take 325 mg by mouth daily.    . chlorthalidone (HYGROTON) 25 MG tablet take 1 tablet by mouth once daily 90 tablet 1  . FLUoxetine (PROZAC) 20 MG capsule take 2 capsules by mouth once daily 60 capsule 5  . meloxicam (MOBIC) 7.5 MG tablet Take 1 tablet (7.5 mg total) by mouth daily as needed. 90 tablet 1  . potassium chloride 20 MEQ/15ML (10%) SOLN take 15 milliliters by mouth once daily 450 mL 1  . promethazine (PHENERGAN) 25 MG tablet take 1 tablet by mouth every 8 hours if needed 20 tablet 0  . rizatriptan (MAXALT) 10 MG tablet take 1 tablet by mouth if needed for MIGRAINE. MAY REPEAT IN 2 HOURS if needed 6 tablet 6  . doxycycline (VIBRA-TABS) 100 MG tablet Take 100 mg by mouth 2 (two) times daily.  0  . fluconazole (DIFLUCAN) 100 MG  tablet TAKE 1 TABLET BY MOUTH ON FIRST, FIFTH, AND TENTH DAY.  0  . FLUZONE HIGH-DOSE 0.5 ML injection inject 0.5 milliliter intramuscularly  0  . nystatin-triamcinolone (MYCOLOG II) cream apply SPARINGLY to affected area twice a day  0   No current facility-administered medications on file prior to visit.     BP 100/60 (BP Location: Right Arm, Patient Position: Sitting, Cuff Size: Normal)   Pulse 73   Temp 97.9 F (36.6 C) (Oral)   Ht 5' 0.05" (1.525 m)   Wt 127 lb 12.8 oz (58 kg)   SpO2 97%   BMI 24.92 kg/m   Subsequent Medicare wellness visit  1. Risk factors, based on past  M,S,F history.  Patient has a history of mild dyslipidemia but no other cardiovascular risk factors except for ongoing tobacco use  2.  Physical activities: No significant exercise program.  3.  Depression/mood: History of chronic anxiety but no history of major depression  4.  Hearing: No deficits.  Is followed by ENT frequently due to a history of Mnire's  5.  ADL's: Independent in all aspects of daily living  6.  Fall risk: Low 7.  Home safety: No problems identified  8.  Height weight, and visual acuity; height and weight stable.  Patient is quite pleased following bilateral cataract extraction surgery  9.  Counseling: Total smoking cessation encouraged  10. Lab orders based on risk factors: Laboratory update reviewed.  Will order a lipid profile  11. Referral : Pulmonary referral for low-dose chest CT screening  12. Care plan: Continue efforts at aggressive risk factor modification including smoking cessation  13. Cognitive assessment: Alert and appropriate normal affect no cognitive dysfunction  14. Screening: Patient provided with a written and personalized 5-10 year screening schedule in the AVS.    15. Provider List Update: Ophthalmology GI primary care and pulmonary medicine    Review of Systems  Constitutional: Negative.   HENT: Positive for tinnitus. Negative for congestion,  dental problem, hearing loss, rhinorrhea, sinus pressure and sore throat.   Eyes: Negative for pain, discharge and visual disturbance.  Respiratory: Negative for cough and shortness of breath.   Cardiovascular: Negative for chest pain, palpitations and leg swelling.  Gastrointestinal: Negative for abdominal distention, abdominal pain, blood in stool, constipation, diarrhea, nausea and vomiting.  Genitourinary: Negative for difficulty urinating, dysuria, flank pain, frequency, hematuria, pelvic pain, urgency, vaginal bleeding,  vaginal discharge and vaginal pain.  Musculoskeletal: Negative for arthralgias, gait problem and joint swelling.  Skin: Negative for rash.  Neurological: Positive for headaches. Negative for dizziness, syncope, speech difficulty, weakness and numbness.  Hematological: Negative for adenopathy.  Psychiatric/Behavioral: Negative for agitation, behavioral problems and dysphoric mood. The patient is nervous/anxious.        Objective:   Physical Exam  Constitutional: She is oriented to person, place, and time. She appears well-developed and well-nourished.  HENT:  Head: Normocephalic.  Right Ear: External ear normal.  Left Ear: External ear normal.  Mouth/Throat: Oropharynx is clear and moist.  Eyes: Conjunctivae and EOM are normal. Pupils are equal, round, and reactive to light.  Neck: Normal range of motion. Neck supple. No thyromegaly present.  Cardiovascular: Normal rate, regular rhythm, normal heart sounds and intact distal pulses.  Pulmonary/Chest: Effort normal and breath sounds normal.  Abdominal: Soft. Bowel sounds are normal. She exhibits no mass. There is no tenderness.  Musculoskeletal: Normal range of motion.  Lymphadenopathy:    She has no cervical adenopathy.  Neurological: She is alert and oriented to person, place, and time.  Skin: Skin is warm and dry. No rash noted.  Psychiatric: She has a normal mood and affect. Her behavior is normal.           Assessment & Plan:   Subsequent Medicare wellness visit Ongoing tobacco use.  Will screen with a low-dose chest CT Mnire's disease follow-up ENT Chronic daily headaches.  History of migraine headaches.  Possible component of medicine overuse syndrome Chronic anxiety disorder.  Alprazolam refilled Osteoarthritis Osteopenia  Review lipid profile Follow-up 6 months Pulmonary consultation for low-dose chest CT screening  Nyoka Cowden

## 2017-01-25 LAB — HEPATITIS C ANTIBODY
Hepatitis C Ab: NONREACTIVE
SIGNAL TO CUT-OFF: 0.01 (ref <1.00)

## 2017-01-31 ENCOUNTER — Encounter: Payer: Self-pay | Admitting: Internal Medicine

## 2017-01-31 DIAGNOSIS — M7062 Trochanteric bursitis, left hip: Secondary | ICD-10-CM | POA: Diagnosis not present

## 2017-01-31 DIAGNOSIS — M5136 Other intervertebral disc degeneration, lumbar region: Secondary | ICD-10-CM | POA: Diagnosis not present

## 2017-01-31 DIAGNOSIS — M7060 Trochanteric bursitis, unspecified hip: Secondary | ICD-10-CM | POA: Diagnosis not present

## 2017-02-05 NOTE — Addendum Note (Signed)
Addended by: Dorrene German on: 02/05/2017 01:10 PM   Modules accepted: Orders

## 2017-02-06 DIAGNOSIS — M5136 Other intervertebral disc degeneration, lumbar region: Secondary | ICD-10-CM | POA: Diagnosis not present

## 2017-02-06 DIAGNOSIS — M545 Low back pain: Secondary | ICD-10-CM | POA: Diagnosis not present

## 2017-02-06 DIAGNOSIS — M25552 Pain in left hip: Secondary | ICD-10-CM | POA: Diagnosis not present

## 2017-02-06 DIAGNOSIS — M6281 Muscle weakness (generalized): Secondary | ICD-10-CM | POA: Diagnosis not present

## 2017-02-06 DIAGNOSIS — M7062 Trochanteric bursitis, left hip: Secondary | ICD-10-CM | POA: Diagnosis not present

## 2017-02-06 DIAGNOSIS — R2689 Other abnormalities of gait and mobility: Secondary | ICD-10-CM | POA: Diagnosis not present

## 2017-02-08 ENCOUNTER — Telehealth: Payer: Self-pay | Admitting: Acute Care

## 2017-02-08 DIAGNOSIS — M5136 Other intervertebral disc degeneration, lumbar region: Secondary | ICD-10-CM | POA: Diagnosis not present

## 2017-02-08 DIAGNOSIS — M25552 Pain in left hip: Secondary | ICD-10-CM | POA: Diagnosis not present

## 2017-02-08 DIAGNOSIS — M6281 Muscle weakness (generalized): Secondary | ICD-10-CM | POA: Diagnosis not present

## 2017-02-08 DIAGNOSIS — F17210 Nicotine dependence, cigarettes, uncomplicated: Principal | ICD-10-CM

## 2017-02-08 DIAGNOSIS — M7062 Trochanteric bursitis, left hip: Secondary | ICD-10-CM | POA: Diagnosis not present

## 2017-02-08 DIAGNOSIS — Z122 Encounter for screening for malignant neoplasm of respiratory organs: Secondary | ICD-10-CM

## 2017-02-08 DIAGNOSIS — M545 Low back pain: Secondary | ICD-10-CM | POA: Diagnosis not present

## 2017-02-08 DIAGNOSIS — R2689 Other abnormalities of gait and mobility: Secondary | ICD-10-CM | POA: Diagnosis not present

## 2017-02-08 NOTE — Telephone Encounter (Signed)
Spoke with pt and scheduled SDMV 02/15/17 2:00 CT ordered Nothing further needed

## 2017-02-11 ENCOUNTER — Other Ambulatory Visit: Payer: Self-pay | Admitting: Internal Medicine

## 2017-02-14 DIAGNOSIS — R2689 Other abnormalities of gait and mobility: Secondary | ICD-10-CM | POA: Diagnosis not present

## 2017-02-14 DIAGNOSIS — M7062 Trochanteric bursitis, left hip: Secondary | ICD-10-CM | POA: Diagnosis not present

## 2017-02-14 DIAGNOSIS — M5136 Other intervertebral disc degeneration, lumbar region: Secondary | ICD-10-CM | POA: Diagnosis not present

## 2017-02-14 DIAGNOSIS — M25552 Pain in left hip: Secondary | ICD-10-CM | POA: Diagnosis not present

## 2017-02-14 DIAGNOSIS — M6281 Muscle weakness (generalized): Secondary | ICD-10-CM | POA: Diagnosis not present

## 2017-02-14 DIAGNOSIS — M545 Low back pain: Secondary | ICD-10-CM | POA: Diagnosis not present

## 2017-02-15 ENCOUNTER — Encounter: Payer: Self-pay | Admitting: Acute Care

## 2017-02-15 ENCOUNTER — Inpatient Hospital Stay: Admission: RE | Admit: 2017-02-15 | Payer: Self-pay | Source: Ambulatory Visit

## 2017-02-19 DIAGNOSIS — M7062 Trochanteric bursitis, left hip: Secondary | ICD-10-CM | POA: Diagnosis not present

## 2017-02-19 DIAGNOSIS — M545 Low back pain: Secondary | ICD-10-CM | POA: Diagnosis not present

## 2017-02-19 DIAGNOSIS — M25552 Pain in left hip: Secondary | ICD-10-CM | POA: Diagnosis not present

## 2017-02-19 DIAGNOSIS — M6281 Muscle weakness (generalized): Secondary | ICD-10-CM | POA: Diagnosis not present

## 2017-02-19 DIAGNOSIS — M5136 Other intervertebral disc degeneration, lumbar region: Secondary | ICD-10-CM | POA: Diagnosis not present

## 2017-02-19 DIAGNOSIS — R2689 Other abnormalities of gait and mobility: Secondary | ICD-10-CM | POA: Diagnosis not present

## 2017-02-22 ENCOUNTER — Ambulatory Visit (INDEPENDENT_AMBULATORY_CARE_PROVIDER_SITE_OTHER): Payer: Medicare Other | Admitting: Acute Care

## 2017-02-22 ENCOUNTER — Encounter: Payer: Self-pay | Admitting: Acute Care

## 2017-02-22 ENCOUNTER — Ambulatory Visit (INDEPENDENT_AMBULATORY_CARE_PROVIDER_SITE_OTHER)
Admission: RE | Admit: 2017-02-22 | Discharge: 2017-02-22 | Disposition: A | Discharge status: Home / Self Care | Payer: Medicare Other | Source: Ambulatory Visit | Attending: Acute Care | Admitting: Acute Care

## 2017-02-22 DIAGNOSIS — F17210 Nicotine dependence, cigarettes, uncomplicated: Secondary | ICD-10-CM | POA: Diagnosis not present

## 2017-02-22 DIAGNOSIS — Z87891 Personal history of nicotine dependence: Secondary | ICD-10-CM | POA: Diagnosis not present

## 2017-02-22 DIAGNOSIS — Z122 Encounter for screening for malignant neoplasm of respiratory organs: Secondary | ICD-10-CM

## 2017-02-22 NOTE — Progress Notes (Signed)
Shared Decision Making Visit Lung Cancer Screening Program [id number])   Eligibility:  Age 67 y.o.  Pack Years Smoking History Calculation 65 pack year smoking history (# packs/per year x # years smoked)  Recent History of coughing up blood  no  Unexplained weight loss? no ( >Than 15 pounds within the last 6 months )  Prior History Lung / other cancer no (Diagnosis within the last 5 years already requiring surveillance chest CT Scans).  Smoking Status Current Smoker  Former Smokers: Years since quit: NA  Quit Date: NA  Visit Components:  Discussion included one or more decision making aids. yes  Discussion included risk/benefits of screening. yes  Discussion included potential follow up diagnostic testing for abnormal scans. yes  Discussion included meaning and risk of over diagnosis. yes  Discussion included meaning and risk of False Positives. yes  Discussion included meaning of total radiation exposure. yes  Counseling Included:  Importance of adherence to annual lung cancer LDCT screening. yes  Impact of comorbidities on ability to participate in the program. yes  Ability and willingness to under diagnostic treatment. yes  Smoking Cessation Counseling:  Current Smokers:   Discussed importance of smoking cessation. yes  Information about tobacco cessation classes and interventions provided to patient. yes  Patient provided with "ticket" for LDCT Scan. yes  Symptomatic Patient. no  Counseling NA  Diagnosis Code: Tobacco Use Z72.0  Asymptomatic Patient yes  Counseling (Intermediate counseling: > three minutes counseling) A2130  Former Smokers:   Discussed the importance of maintaining cigarette abstinence. yes  Diagnosis Code: Personal History of Nicotine Dependence. Q65.784  Information about tobacco cessation classes and interventions provided to patient. Yes  Patient provided with "ticket" for LDCT Scan. yes  Written Order for Lung Cancer  Screening with LDCT placed in Epic. Yes (CT Chest Lung Cancer Screening Low Dose W/O CM) [id number] Z12.2-Screening of respiratory organs Z87.891-Personal history of nicotine dependence  I have spent 25 minutes of face to face time with Ms. Perko discussing the risks and benefits of lung cancer screening. We viewed a power point together that explained in detail the above noted topics. We paused at intervals to allow for questions to be asked and answered to ensure understanding.We discussed that the single most powerful action that she can take to decrease her risk of developing lung cancer is to quit smoking. We discussed whether or not she is ready to commit to setting a quit date. We discussed options for tools to aid in quitting smoking including nicotine replacement therapy, non-nicotine medications, support groups, Quit Smart classes, and behavior modification. We discussed that often times setting smaller, more achievable goals, such as eliminating 1 cigarette a day for a week and then 2 cigarettes a day for a week can be helpful in slowly decreasing the number of cigarettes smoked. This allows for a sense of accomplishment as well as providing a clinical benefit. I gave her the " Be Stronger Than Your Excuses" card with contact information for community resources, classes, free nicotine replacement therapy, and access to mobile apps, text messaging, and on-line smoking cessation help. I have also given her my card and contact information in the event she needs to contact me. We discussed the time and location of the scan, and that either Doroteo Glassman RN or I will call with the results within 24-48 hours of receiving them. I have offered her  a copy of the power point we viewed  as a resource in the event they need reinforcement  of the concepts we discussed today in the office. The patient verbalized understanding of all of  the above and had no further questions upon leaving the office. They have my  contact information in the event they have any further questions.  I spent 5 minutes counseling on smoking cessation and the health risks of continued tobacco abuse.  I explained to the patient that there has been a high incidence of coronary artery disease noted on these exams. I explained that this is a non-gated exam therefore degree or severity cannot be determined. This patient is not on statin therapy. I have asked the patient to follow-up with their PCP regarding any incidental finding of coronary artery disease and management with diet or medication as their PCP  feels is clinically indicated. The patient verbalized understanding of the above and had no further questions upon completion of the visit.      Magdalen Spatz, NP 02/22/2017 4:37 PM

## 2017-02-25 ENCOUNTER — Other Ambulatory Visit: Payer: Self-pay | Admitting: Internal Medicine

## 2017-02-25 NOTE — Telephone Encounter (Signed)
Dr K pt 

## 2017-02-26 ENCOUNTER — Other Ambulatory Visit: Payer: Self-pay | Admitting: Internal Medicine

## 2017-02-26 ENCOUNTER — Telehealth: Payer: Self-pay | Admitting: Internal Medicine

## 2017-02-26 ENCOUNTER — Telehealth: Payer: Self-pay | Admitting: Acute Care

## 2017-02-26 DIAGNOSIS — M7062 Trochanteric bursitis, left hip: Secondary | ICD-10-CM | POA: Diagnosis not present

## 2017-02-26 DIAGNOSIS — Z122 Encounter for screening for malignant neoplasm of respiratory organs: Secondary | ICD-10-CM

## 2017-02-26 DIAGNOSIS — M545 Low back pain: Secondary | ICD-10-CM | POA: Diagnosis not present

## 2017-02-26 DIAGNOSIS — M5136 Other intervertebral disc degeneration, lumbar region: Secondary | ICD-10-CM | POA: Diagnosis not present

## 2017-02-26 DIAGNOSIS — M25552 Pain in left hip: Secondary | ICD-10-CM | POA: Diagnosis not present

## 2017-02-26 DIAGNOSIS — R2689 Other abnormalities of gait and mobility: Secondary | ICD-10-CM | POA: Diagnosis not present

## 2017-02-26 DIAGNOSIS — F17210 Nicotine dependence, cigarettes, uncomplicated: Principal | ICD-10-CM

## 2017-02-26 DIAGNOSIS — M6281 Muscle weakness (generalized): Secondary | ICD-10-CM | POA: Diagnosis not present

## 2017-02-27 ENCOUNTER — Encounter: Payer: Self-pay | Admitting: Internal Medicine

## 2017-02-27 NOTE — Telephone Encounter (Signed)
LMTC x 1  

## 2017-02-27 NOTE — Telephone Encounter (Signed)
Spoke with patient and she has her medication

## 2017-02-27 NOTE — Telephone Encounter (Signed)
Patient calling to find out why her Xanax was denied?

## 2017-02-27 NOTE — Telephone Encounter (Signed)
Pt informed of CT results per Sarah Groce, NP.  PT verbalized understanding.  Copy sent to PCP.  Order placed for 1 yr f/u CT.  

## 2017-02-28 DIAGNOSIS — M5136 Other intervertebral disc degeneration, lumbar region: Secondary | ICD-10-CM | POA: Diagnosis not present

## 2017-02-28 DIAGNOSIS — M6281 Muscle weakness (generalized): Secondary | ICD-10-CM | POA: Diagnosis not present

## 2017-02-28 DIAGNOSIS — M7062 Trochanteric bursitis, left hip: Secondary | ICD-10-CM | POA: Diagnosis not present

## 2017-02-28 DIAGNOSIS — M545 Low back pain: Secondary | ICD-10-CM | POA: Diagnosis not present

## 2017-02-28 DIAGNOSIS — M25552 Pain in left hip: Secondary | ICD-10-CM | POA: Diagnosis not present

## 2017-02-28 DIAGNOSIS — R2689 Other abnormalities of gait and mobility: Secondary | ICD-10-CM | POA: Diagnosis not present

## 2017-03-11 ENCOUNTER — Other Ambulatory Visit: Payer: Self-pay | Admitting: Internal Medicine

## 2017-03-11 NOTE — Telephone Encounter (Signed)
Okay to refill #20

## 2017-03-11 NOTE — Telephone Encounter (Signed)
Patient was seen in a 67month time frame 01/24/2017 However medication hasn't been filled since 05/25/2016  Okay to fill? Please advise

## 2017-03-12 NOTE — Telephone Encounter (Signed)
Medication filled to pharmacy as requested.   

## 2017-03-14 ENCOUNTER — Encounter: Payer: Self-pay | Admitting: Internal Medicine

## 2017-03-14 DIAGNOSIS — Z8669 Personal history of other diseases of the nervous system and sense organs: Secondary | ICD-10-CM

## 2017-03-15 ENCOUNTER — Encounter: Payer: Self-pay | Admitting: Neurology

## 2017-03-15 ENCOUNTER — Other Ambulatory Visit: Payer: Self-pay | Admitting: Internal Medicine

## 2017-03-15 DIAGNOSIS — Z8669 Personal history of other diseases of the nervous system and sense organs: Secondary | ICD-10-CM

## 2017-03-20 DIAGNOSIS — M5136 Other intervertebral disc degeneration, lumbar region: Secondary | ICD-10-CM | POA: Diagnosis not present

## 2017-03-20 DIAGNOSIS — M7060 Trochanteric bursitis, unspecified hip: Secondary | ICD-10-CM | POA: Diagnosis not present

## 2017-03-20 DIAGNOSIS — M7062 Trochanteric bursitis, left hip: Secondary | ICD-10-CM | POA: Diagnosis not present

## 2017-03-22 NOTE — Addendum Note (Signed)
Addended by: Gwenyth Ober R on: 03/22/2017 10:23 AM   Modules accepted: Orders

## 2017-04-01 ENCOUNTER — Other Ambulatory Visit: Payer: Self-pay | Admitting: Internal Medicine

## 2017-04-02 ENCOUNTER — Encounter: Payer: Self-pay | Admitting: Internal Medicine

## 2017-04-04 DIAGNOSIS — J309 Allergic rhinitis, unspecified: Secondary | ICD-10-CM | POA: Diagnosis not present

## 2017-04-04 NOTE — Telephone Encounter (Signed)
Medication faxed an pharmacy called for verification.

## 2017-04-17 DIAGNOSIS — J301 Allergic rhinitis due to pollen: Secondary | ICD-10-CM | POA: Diagnosis not present

## 2017-04-17 DIAGNOSIS — H9312 Tinnitus, left ear: Secondary | ICD-10-CM | POA: Diagnosis not present

## 2017-04-17 DIAGNOSIS — H8103 Meniere's disease, bilateral: Secondary | ICD-10-CM | POA: Diagnosis not present

## 2017-04-17 DIAGNOSIS — H8110 Benign paroxysmal vertigo, unspecified ear: Secondary | ICD-10-CM | POA: Diagnosis not present

## 2017-04-30 ENCOUNTER — Encounter

## 2017-04-30 ENCOUNTER — Ambulatory Visit (INDEPENDENT_AMBULATORY_CARE_PROVIDER_SITE_OTHER): Payer: Medicare Other | Admitting: Neurology

## 2017-04-30 ENCOUNTER — Encounter: Payer: Self-pay | Admitting: Neurology

## 2017-04-30 VITALS — BP 118/62 | HR 95 | Ht 61.5 in | Wt 131.0 lb

## 2017-04-30 DIAGNOSIS — F17200 Nicotine dependence, unspecified, uncomplicated: Secondary | ICD-10-CM

## 2017-04-30 DIAGNOSIS — H8102 Meniere's disease, left ear: Secondary | ICD-10-CM

## 2017-04-30 DIAGNOSIS — G43109 Migraine with aura, not intractable, without status migrainosus: Secondary | ICD-10-CM

## 2017-04-30 DIAGNOSIS — F17210 Nicotine dependence, cigarettes, uncomplicated: Secondary | ICD-10-CM | POA: Diagnosis not present

## 2017-04-30 DIAGNOSIS — G4440 Drug-induced headache, not elsewhere classified, not intractable: Secondary | ICD-10-CM

## 2017-04-30 MED ORDER — TOPIRAMATE 25 MG PO TABS: 25.0000 mg | ORAL_TABLET | Freq: Every day | ORAL | 2 refills | Status: DC

## 2017-04-30 MED ORDER — RIZATRIPTAN BENZOATE 10 MG PO TABS
10.0000 mg | ORAL_TABLET | ORAL | 0 refills | Status: DC | PRN
Start: 2017-04-30 — End: 2022-07-31

## 2017-04-30 MED ORDER — RIZATRIPTAN BENZOATE 10 MG PO TABS: ORAL_TABLET | ORAL | 3 refills | Status: DC

## 2017-04-30 MED ORDER — TOPIRAMATE 25 MG PO TABS: 25.0000 mg | ORAL_TABLET | Freq: Two times a day (BID) | ORAL | 3 refills | Status: DC

## 2017-04-30 NOTE — Progress Notes (Signed)
NEUROLOGY CONSULTATION NOTE  Allison Jimenez MRN: [id number] DOB: [birth year]  Referring provider: Dr. Burnice Logan Primary care provider: Dr. Burnice Logan  Reason for consult:  headache  HISTORY OF PRESENT ILLNESS: Allison Jimenez is a 67 year old  female with dyslipidemia, anxiety, Meniere's disease, cigarette smoker and migraines who presents for migraines.  History supplemented by prior neurologist's notes.  Onset:  She has had migraines since her 21s but wasn't formally diagnosed until 2014.  She has had left-sided Meniere's disease since the early 1990s..  She has daily bi-frontal/temporal dull nonthrobbing headache.  Her migraines are as follows: Location:  Right frontal Quality:  pounding Intensity:  severe.  She denies new headache, thunderclap headache or severe headache that wakes her from sleep. Aura:  Shimmering light in visual field of either eye lasting 15-20 minutes Prodrome:  no Postdrome:  no Associated symptoms:  Nausea, photophobia, phonophobia and sometimes vertigo.  She denies associated vomiting and unilateral numbness or weakness. Duration:  Often within an hour with Maxalt but will later return in the day Frequency:  2 days a week. Frequency of abortive medication: Tylenol and ASA daily Triggers/exacerbating factors:  Stress, change in weather Relieving factors:  Cold compresses, rest Activity:  aggravates  Current NSAIDS:  ASA 325mg , Celebrex (infrequent for arthritis) Current analgesics:  Tylenol Current triptans:  rizatriptan 5mg  Current anti-emetic:  promethazine 25mg  Current muscle relaxants:  no Current anti-anxiolytic:  alprazolam 0.25mg  Current sleep aide:  no Current Antihypertensive medications:  no Current Antidepressant medications:  fluoxetine 40mg  Current Anticonvulsant medications:  no Current Vitamins/Herbal/Supplements:  no Current Antihistamines/Decongestants:  no Other therapy:  Chiropractor, massage  Past NSAIDS:  naproxen 250mg ,  meloxicam Past analgesics:  Goodys Past abortive triptans:  no Past muscle relaxants:  no Past anti-emetic:  no Past antihypertensive medications:  no Past antidepressant medications:  no Past anticonvulsant medications:  no Past vitamins/Herbal/Supplements:  no Past antihistamines/decongestants:  no Other past therapies:  no  Caffeine:  no Alcohol:  no Smoker:  12 cigarettes daily Diet:  hydrates Depression:  yes; Anxiety:  yes Other pain:  arthritis Sleep hygiene:  varies  As per prior neurologist's notes, she has reportedly had two brain MRIs.  MRI from September 2014 showed mild periventricular small vessel disease and mild atrophy which was slightly progressed from prior study in 2008.  07/09/16 LABS:  CBC with WBC 8.2, HGB 14.2, HCT 41.1, PLT 334; CMP with Na 135, K 3.6, Cl 96, CO2 32, glucose 100, BUN 16, Cr 0.81, t bili 0.2, ALP 42, AST 16 and ALT 14.  PAST MEDICAL HISTORY: Past Medical History:  Diagnosis Date  . Allergy   . ANXIETY 06/05/2006  . Arthritis    SHOULDERS   . BACK PAIN 01/05/2010  . Cataract    bilateral, left worse  . DIVERTICULOSIS, COLON 06/05/2006  . DIZZINESS OR VERTIGO 06/05/2006   positional  . Hearing loss in left ear   . HYPERLIPIDEMIA 06/05/2006  . HYPOKALEMIA 12/14/2008  . MENIERE'S DISEASE 06/25/2006  . TOBACCO USER 11/25/2007    PAST SURGICAL HISTORY: Past Surgical History:  Procedure Laterality Date  . APPENDECTOMY    . CESAREAN SECTION     x1  . CHOLECYSTECTOMY    . COLONOSCOPY    . COSMETIC SURGERY    . mastoid surgery  1992   shunt in mastoid- endolymphatic sac in left ear   . TUBAL LIGATION    . vocal cord surgery     nodule x2    MEDICATIONS:  Current Outpatient Medications on File Prior to Visit  Medication Sig Dispense Refill  . ALPRAZolam (XANAX) 0.25 MG tablet TAKE 1 TABLET BY MOUTH THREE TIMES A DAY. 90 tablet 0  . AMBULATORY NON FORMULARY MEDICATION Medication Name: Nitro gel 0.125%- apply pea size amount 2-3 times  daily for anal fissure (Patient not taking: Reported on 04/30/2017) 30 g 0  . aspirin EC 325 MG tablet Take 325 mg by mouth daily.    . celecoxib (CELEBREX) 200 MG capsule Take 200 mg by mouth daily.  0  . chlorthalidone (HYGROTON) 25 MG tablet take 1 tablet by mouth once daily 90 tablet 1  . doxycycline (VIBRA-TABS) 100 MG tablet Take 100 mg by mouth 2 (two) times daily.  0  . fluconazole (DIFLUCAN) 100 MG tablet TAKE 1 TABLET BY MOUTH ON FIRST, FIFTH, AND TENTH DAY.  0  . FLUoxetine (PROZAC) 20 MG capsule take 2 capsules by mouth once daily 60 capsule 5  . FLUZONE HIGH-DOSE 0.5 ML injection inject 0.5 milliliter intramuscularly  0  . meloxicam (MOBIC) 7.5 MG tablet Take 1 tablet (7.5 mg total) by mouth daily as needed. (Patient not taking: Reported on 04/30/2017) 90 tablet 1  . nystatin-triamcinolone (MYCOLOG II) cream apply SPARINGLY to affected area twice a day  0  . potassium chloride 20 MEQ/15ML (10%) SOLN take 15 milliliters by mouth once daily 450 mL 1  . promethazine (PHENERGAN) 25 MG tablet take 1 tablet by mouth every 8 hours if needed 20 tablet 0   No current facility-administered medications on file prior to visit.     ALLERGIES: Allergies  Allergen Reactions  . Erythromycin Base Diarrhea  . Penicillins     FAMILY HISTORY: Family History  Problem Relation Age of Onset  . Breast cancer Mother   . Migraines Unknown   . Depression Unknown   . Anxiety disorder Unknown   . High Cholesterol Unknown   . Colon cancer Neg Hx   . Rectal cancer Neg Hx   . Stomach cancer Neg Hx   . Colon polyps Neg Hx   . Esophageal cancer Neg Hx     SOCIAL HISTORY: Social History   Socioeconomic History  . Marital status: Divorced    Spouse name: Not on file  . Number of children: 1  . Years of education: 3  . Highest education level: Not on file  Occupational History    Employer: [employer]    Comment: Retired  Scientific laboratory technician  . Financial resource strain: Not on file    . Food insecurity:    Worry: Not on file    Inability: Not on file  . Transportation needs:    Medical: Not on file    Non-medical: Not on file  Tobacco Use  . Smoking status: Current Every Day Smoker    Packs/day: 1.50    Years: 43.00    Pack years: 64.50    Types: Cigarettes  . Smokeless tobacco: Never Used  . Tobacco comment: using electronic cigs.-12 cigs a day as well  Substance and Sexual Activity  . Alcohol use: No    Alcohol/week: 0.0 oz  . Drug use: No  . Sexual activity: Not on file  Lifestyle  . Physical activity:    Days per week: Not on file    Minutes per session: Not on file  . Stress: Not on file  Relationships  . Social connections:    Talks on phone: Not on file    Gets together:  Not on file    Attends religious service: Not on file    Active member of club or organization: Not on file    Attends meetings of clubs or organizations: Not on file    Relationship status: Not on file  . Intimate partner violence:    Fear of current or ex partner: Not on file    Emotionally abused: Not on file    Physically abused: Not on file    Forced sexual activity: Not on file  Other Topics Concern  . Not on file  Social History Narrative   Patient lives at home alone. Patient has a Data processing manager. Patient is divorced .   Patient is retired.   Education high school.   Right handed.   Caffeine none.    REVIEW OF SYSTEMS: Constitutional: No fevers, chills, or sweats, no generalized fatigue, change in appetite Eyes: No visual changes, double vision, eye pain Ear, nose and throat: left sided hearing loss and tinnitus Cardiovascular: No chest pain, palpitations Respiratory:  No shortness of breath at rest or with exertion, wheezes GastrointestinaI: No nausea, vomiting, diarrhea, abdominal pain, fecal incontinence Genitourinary:  No dysuria, urinary retention or frequency Musculoskeletal:  No neck pain, back pain Integumentary: No rash, pruritus, skin  lesions Neurological: as above Psychiatric: depression, anxiety Endocrine: No palpitations, fatigue, diaphoresis, mood swings, change in appetite, change in weight, increased thirst Hematologic/Lymphatic:  No purpura, petechiae. Allergic/Immunologic: no itchy/runny eyes, nasal congestion, recent allergic reactions, rashes  PHYSICAL EXAM: Vitals:   04/30/17 1304  BP: 118/62  Pulse: 95  SpO2: 96%   General: No acute distress.  Patient appears well-groomed.  Head:  Normocephalic/atraumatic Eyes:  fundi examined but not visualized Neck: supple, no paraspinal tenderness, full range of motion Back: No paraspinal tenderness Heart: regular rate and rhythm Lungs: Clear to auscultation bilaterally. Vascular: No carotid bruits. Neurological Exam: Mental status: alert and oriented to person, place, and time, recent and remote memory intact, fund of knowledge intact, attention and concentration intact, speech fluent and not dysarthric, language intact. Cranial nerves: CN I: not tested CN II: pupils equal, round and reactive to light, visual fields intact CN III, IV, VI:  full range of motion, no nystagmus, no ptosis CN V: facial sensation intact CN VII: upper and lower face symmetric CN VIII: decreased hearing on left CN IX, X: gag intact, uvula midline CN XI: sternocleidomastoid and trapezius muscles intact CN XII: tongue midline Bulk & Tone: normal, no fasciculations. Motor:  5/5 throughout  Sensation: temperature and vibration sensation intact. Deep Tendon Reflexes:  2+ throughout, toes downgoing.  Finger to nose testing:  Without dysmetria.  Heel to shin:  Without dysmetria.  Gait:  Normal station and stride.  Able to turn and tandem walk. Romberg negative.  IMPRESSION: Chronic migraine Migraine with aura Medication overuse headache Meniere's disease, left  PLAN: 1.  Start topiramate 25mg  at bedtime.  Call in 4 weeks with update and we can adjust dose if needed.  Discussed  side effects including rare but increased risk of kidney stones and glaucoma 2.  Take rizatriptan 10mg  at earliest onset of headache.  May repeat dose once in 2 hours if needed.  Do not exceed two tablets in 24 hours. 3.  STOP ASPIRIN AND TYLENOL.  Limit use of pain relievers to no more than 2 days out of the week.  These medications include acetaminophen, ibuprofen, triptans and narcotics.  This will help reduce risk of rebound headaches. 4.  Be aware of common  food triggers such as processed sweets, processed foods with nitrites (such as deli meat, hot dogs, sausages), foods with MSG, alcohol (such as wine), chocolate, certain cheeses, certain fruits (dried fruits, bananas, some citrus fruit), vinegar, diet soda. 4.  Avoid caffeine 5.  Routine exercise 6.  Proper sleep hygiene 7.  Stay adequately hydrated with water 8.  Keep a headache diary. 9.  Maintain proper stress management. 10.  Do not skip meals. 11.  Consider supplements:  Magnesium citrate 400mg  to 600mg  daily, riboflavin 400mg , Coenzyme Q 10 100mg  three times daily 12.  Follow up in 3 months. 13.  Tobacco cessation counseling (CPT 99406):  Tobacco use with no history of CAD, stroke, or cancer  - Currently smoking 12 cigarettes/day   - Patient was informed of the dangers of tobacco abuse including stroke, cancer, and MI, as well as benefits of tobacco cessation. - Patient is not willing to quit at this time. - Approximately 5 mins were spent counseling patient cessation techniques. We discussed various methods to help quit smoking, including deciding on a date to quit, joining a support group, pharmacological agents- nicotine gum/patch/lozenges, chantix.  - I will reassess her progress at the next follow-up visit 14.  Follow up in 3 months.  Thank you for allowing me to take part in the care of this patient.  Metta Clines, DO  CC: Dr. Burnice Logan

## 2017-04-30 NOTE — Patient Instructions (Signed)
Migraine Recommendations: 1.  Start topiramate 25mg  at bedtime.  Call in 4 weeks with update and we can adjust dose if needed. 2.  Take rizatriptan 10mg  at earliest onset of headache.  May repeat dose once in 2 hours if needed.  Do not exceed two tablets in 24 hours. 3.  STOP ASPIRIN AND TYLENOL.  Limit use of pain relievers to no more than 2 days out of the week.  These medications include acetaminophen, ibuprofen, triptans and narcotics.  This will help reduce risk of rebound headaches. 4.  Be aware of common food triggers such as processed sweets, processed foods with nitrites (such as deli meat, hot dogs, sausages), foods with MSG, alcohol (such as wine), chocolate, certain cheeses, certain fruits (dried fruits, bananas, some citrus fruit), vinegar, diet soda. 4.  Avoid caffeine 5.  Routine exercise 6.  Proper sleep hygiene 7.  Stay adequately hydrated with water 8.  Keep a headache diary. 9.  Maintain proper stress management. 10.  Do not skip meals. 11.  Consider supplements:  Magnesium citrate 400mg  to 600mg  daily, riboflavin 400mg , Coenzyme Q 10 100mg  three times daily 12.  Follow up in 3 months.

## 2017-04-30 NOTE — Progress Notes (Signed)
Pt indicated she needs medications sent to [facility] in [place] on [address], not [facility]. Called [facility] and [facility] Rx's, re-sent to [facility]

## 2017-05-02 ENCOUNTER — Encounter: Payer: Self-pay | Admitting: Neurology

## 2017-05-03 ENCOUNTER — Encounter: Payer: Self-pay | Admitting: Neurology

## 2017-05-06 ENCOUNTER — Other Ambulatory Visit: Payer: Self-pay | Admitting: Internal Medicine

## 2017-05-07 ENCOUNTER — Telehealth: Payer: Self-pay | Admitting: *Deleted

## 2017-05-07 ENCOUNTER — Other Ambulatory Visit: Payer: Self-pay

## 2017-05-07 MED ORDER — ALPRAZOLAM 0.25 MG PO TABS: 0.2500 mg | ORAL_TABLET | Freq: Three times a day (TID) | ORAL | 0 refills | Status: DC

## 2017-05-07 NOTE — Telephone Encounter (Signed)
Copied from [place] [phone] [id number]. Topic: Quick Communication - Rx Refill/Question >> May 07, 2017  9:28 AM Percell Belt A wrote: Medication: ALPRAZolam Duanne Moron) 0.25 MG tablet [[id number]]  Has the patient contacted their pharmacy? No  (Agent: If no, request that the patient contact the pharmacy for the refill.) Preferred Pharmacy (with phone number or street name): [facility] on [address] [place]  Agent: Please be advised that RX refills may take up to 3 business days. We ask that you follow-up with your pharmacy.

## 2017-05-15 ENCOUNTER — Telehealth: Payer: Self-pay | Admitting: Neurology

## 2017-05-15 NOTE — Telephone Encounter (Signed)
Called and spoke with Pt. She does not want to take topiramate any longer, she feels like it is not helping, and in fact headaches seem worse. Pt has taken Maxalt 3 days in the last 5 days. She was trying to wean off Tylenol and ASA, but "has to take something to ease the pain". I did remind her she is not to take triptans that often. She has been taking Tylenol and ASA daily again. When asked about taking Aleve, she said she has never taken it and is afraid to try it. She has not been taking Celebrex. She wants to know if there is something else she can try? She is aware call back will be tomorrow.

## 2017-05-15 NOTE — Telephone Encounter (Signed)
Patient called and is needing to speak with you regarding her Topiramate medication and her headaches. Please Call. Thanks

## 2017-05-15 NOTE — Telephone Encounter (Signed)
My recommendation is to continue topiramate.  She did not give it a chance.  She was on it at the lowest possible dose for less than a month.  We can increase dose to 50mg  at bedtime, but she will need to give that dose 4 weeks before making any further changes.  There is nothing I can give her that will reduce her headaches so soon, no matter what medication.  It will unfortunately have to take time and patience.  And as long as she co ntinues to take pain relievers so frequently, the harder it will be to get the headaches controlled

## 2017-05-16 DIAGNOSIS — M542 Cervicalgia: Secondary | ICD-10-CM | POA: Diagnosis not present

## 2017-05-16 DIAGNOSIS — M9901 Segmental and somatic dysfunction of cervical region: Secondary | ICD-10-CM | POA: Diagnosis not present

## 2017-05-16 DIAGNOSIS — M9903 Segmental and somatic dysfunction of lumbar region: Secondary | ICD-10-CM | POA: Diagnosis not present

## 2017-05-16 DIAGNOSIS — M9902 Segmental and somatic dysfunction of thoracic region: Secondary | ICD-10-CM | POA: Diagnosis not present

## 2017-05-16 DIAGNOSIS — M5136 Other intervertebral disc degeneration, lumbar region: Secondary | ICD-10-CM | POA: Diagnosis not present

## 2017-05-16 DIAGNOSIS — M5032 Other cervical disc degeneration, mid-cervical region, unspecified level: Secondary | ICD-10-CM | POA: Diagnosis not present

## 2017-05-16 NOTE — Telephone Encounter (Signed)
Called and LMOVM for Pt to call me back. 

## 2017-05-17 ENCOUNTER — Telehealth: Payer: Self-pay | Admitting: Neurology

## 2017-05-17 NOTE — Telephone Encounter (Signed)
Called and spoke with Pt, advsd her of Dr Georgie Chard recommendations. Pt will consider the increase.

## 2017-05-17 NOTE — Telephone Encounter (Signed)
Pt left a message that she was retuning Sandy's call

## 2017-05-23 DIAGNOSIS — M9901 Segmental and somatic dysfunction of cervical region: Secondary | ICD-10-CM | POA: Diagnosis not present

## 2017-05-23 DIAGNOSIS — M9902 Segmental and somatic dysfunction of thoracic region: Secondary | ICD-10-CM | POA: Diagnosis not present

## 2017-05-23 DIAGNOSIS — M5032 Other cervical disc degeneration, mid-cervical region, unspecified level: Secondary | ICD-10-CM | POA: Diagnosis not present

## 2017-05-23 DIAGNOSIS — M542 Cervicalgia: Secondary | ICD-10-CM | POA: Diagnosis not present

## 2017-05-23 DIAGNOSIS — M9903 Segmental and somatic dysfunction of lumbar region: Secondary | ICD-10-CM | POA: Diagnosis not present

## 2017-05-23 DIAGNOSIS — M5136 Other intervertebral disc degeneration, lumbar region: Secondary | ICD-10-CM | POA: Diagnosis not present

## 2017-05-30 DIAGNOSIS — M9902 Segmental and somatic dysfunction of thoracic region: Secondary | ICD-10-CM | POA: Diagnosis not present

## 2017-05-30 DIAGNOSIS — M542 Cervicalgia: Secondary | ICD-10-CM | POA: Diagnosis not present

## 2017-05-30 DIAGNOSIS — M5136 Other intervertebral disc degeneration, lumbar region: Secondary | ICD-10-CM | POA: Diagnosis not present

## 2017-05-30 DIAGNOSIS — M5032 Other cervical disc degeneration, mid-cervical region, unspecified level: Secondary | ICD-10-CM | POA: Diagnosis not present

## 2017-05-30 DIAGNOSIS — M9903 Segmental and somatic dysfunction of lumbar region: Secondary | ICD-10-CM | POA: Diagnosis not present

## 2017-05-30 DIAGNOSIS — M9901 Segmental and somatic dysfunction of cervical region: Secondary | ICD-10-CM | POA: Diagnosis not present

## 2017-06-03 ENCOUNTER — Other Ambulatory Visit: Payer: Self-pay | Admitting: Internal Medicine

## 2017-06-06 ENCOUNTER — Other Ambulatory Visit: Payer: Self-pay | Admitting: Internal Medicine

## 2017-06-06 DIAGNOSIS — M5032 Other cervical disc degeneration, mid-cervical region, unspecified level: Secondary | ICD-10-CM | POA: Diagnosis not present

## 2017-06-06 DIAGNOSIS — M9902 Segmental and somatic dysfunction of thoracic region: Secondary | ICD-10-CM | POA: Diagnosis not present

## 2017-06-06 DIAGNOSIS — M542 Cervicalgia: Secondary | ICD-10-CM | POA: Diagnosis not present

## 2017-06-06 DIAGNOSIS — M9903 Segmental and somatic dysfunction of lumbar region: Secondary | ICD-10-CM | POA: Diagnosis not present

## 2017-06-06 DIAGNOSIS — M5136 Other intervertebral disc degeneration, lumbar region: Secondary | ICD-10-CM | POA: Diagnosis not present

## 2017-06-06 DIAGNOSIS — M9901 Segmental and somatic dysfunction of cervical region: Secondary | ICD-10-CM | POA: Diagnosis not present

## 2017-06-07 ENCOUNTER — Other Ambulatory Visit: Payer: Self-pay

## 2017-06-07 MED ORDER — ALPRAZOLAM 0.25 MG PO TABS: 0.2500 mg | ORAL_TABLET | Freq: Three times a day (TID) | ORAL | 0 refills | Status: DC

## 2017-06-17 DIAGNOSIS — M542 Cervicalgia: Secondary | ICD-10-CM | POA: Diagnosis not present

## 2017-06-17 DIAGNOSIS — M5136 Other intervertebral disc degeneration, lumbar region: Secondary | ICD-10-CM | POA: Diagnosis not present

## 2017-06-17 DIAGNOSIS — M9902 Segmental and somatic dysfunction of thoracic region: Secondary | ICD-10-CM | POA: Diagnosis not present

## 2017-06-17 DIAGNOSIS — M5032 Other cervical disc degeneration, mid-cervical region, unspecified level: Secondary | ICD-10-CM | POA: Diagnosis not present

## 2017-06-17 DIAGNOSIS — M9901 Segmental and somatic dysfunction of cervical region: Secondary | ICD-10-CM | POA: Diagnosis not present

## 2017-06-17 DIAGNOSIS — M9903 Segmental and somatic dysfunction of lumbar region: Secondary | ICD-10-CM | POA: Diagnosis not present

## 2017-06-18 ENCOUNTER — Encounter: Payer: Self-pay | Admitting: Internal Medicine

## 2017-06-18 ENCOUNTER — Other Ambulatory Visit: Payer: Self-pay | Admitting: Internal Medicine

## 2017-06-18 ENCOUNTER — Telehealth: Payer: Self-pay | Admitting: Internal Medicine

## 2017-06-18 NOTE — Telephone Encounter (Signed)
Copied from [place] [id number]. Topic: Quick Communication - Rx Refill/Question >> Jun 18, 2017 10:16 AM Mcneil, Ja-Kwan wrote: Medication: potassium chloride 20 MEQ/15ML (10%) SOLN  Has the patient contacted their pharmacy? yes   Preferred Pharmacy (with phone number or street name): [facility] [place], [address] AT [address] [phone] (Phone) [phone] (Fax)   Agent: Please be advised that RX refills may take up to 3 business days. We ask that you follow-up with your pharmacy.

## 2017-06-18 NOTE — Telephone Encounter (Signed)
See pt email regarding refill request for Potassium Chloride 20 MEQ/31ml (10%) SOLN.   Last filled on 02/12/17 with 1 refill Last OV: 01/24/17 PCP: Dr. Myra Gianotti   [phone] in [place]

## 2017-06-19 NOTE — Telephone Encounter (Signed)
Medication filled to pharmacy as requested.   

## 2017-06-26 ENCOUNTER — Other Ambulatory Visit: Payer: Self-pay | Admitting: Internal Medicine

## 2017-06-28 ENCOUNTER — Ambulatory Visit: Payer: Self-pay | Admitting: Neurology

## 2017-07-03 ENCOUNTER — Other Ambulatory Visit: Payer: Self-pay | Admitting: Internal Medicine

## 2017-07-03 DIAGNOSIS — M7062 Trochanteric bursitis, left hip: Secondary | ICD-10-CM | POA: Diagnosis not present

## 2017-07-03 NOTE — Telephone Encounter (Signed)
Sent to the pharmacy by e-scribe for 30 days. Pt now scheduled to see Dr. Raliegh Ip on 07/23/17.  No further action required.

## 2017-07-03 NOTE — Telephone Encounter (Signed)
Pt due for 6 month follow up.  Left a message for a return call.  CRM created.

## 2017-07-03 NOTE — Telephone Encounter (Signed)
Patient returned call to [facility] to state that she made an appointment for 6 month follow up and wanted to know the status of her refill request.  Please call back as soon as possible to confirm.  CB# [phone].

## 2017-07-11 DIAGNOSIS — J322 Chronic ethmoidal sinusitis: Secondary | ICD-10-CM | POA: Diagnosis not present

## 2017-07-11 DIAGNOSIS — J370 Chronic laryngitis: Secondary | ICD-10-CM | POA: Diagnosis not present

## 2017-07-11 DIAGNOSIS — J320 Chronic maxillary sinusitis: Secondary | ICD-10-CM | POA: Diagnosis not present

## 2017-07-11 DIAGNOSIS — J40 Bronchitis, not specified as acute or chronic: Secondary | ICD-10-CM | POA: Diagnosis not present

## 2017-07-11 DIAGNOSIS — J039 Acute tonsillitis, unspecified: Secondary | ICD-10-CM | POA: Diagnosis not present

## 2017-07-17 ENCOUNTER — Other Ambulatory Visit: Payer: Self-pay

## 2017-07-17 ENCOUNTER — Other Ambulatory Visit: Payer: Self-pay | Admitting: Internal Medicine

## 2017-07-17 MED ORDER — ALPRAZOLAM 0.25 MG PO TABS: 0.2500 mg | ORAL_TABLET | Freq: Three times a day (TID) | ORAL | 0 refills | Status: DC

## 2017-07-23 ENCOUNTER — Ambulatory Visit (INDEPENDENT_AMBULATORY_CARE_PROVIDER_SITE_OTHER): Payer: Medicare Other | Admitting: Internal Medicine

## 2017-07-23 ENCOUNTER — Encounter: Payer: Self-pay | Admitting: Internal Medicine

## 2017-07-23 VITALS — BP 116/52 | HR 81 | Temp 98.1°F | Wt 128.4 lb

## 2017-07-23 DIAGNOSIS — H8109 Meniere's disease, unspecified ear: Secondary | ICD-10-CM

## 2017-07-23 DIAGNOSIS — E876 Hypokalemia: Secondary | ICD-10-CM | POA: Diagnosis not present

## 2017-07-23 DIAGNOSIS — F17200 Nicotine dependence, unspecified, uncomplicated: Secondary | ICD-10-CM

## 2017-07-23 DIAGNOSIS — G43009 Migraine without aura, not intractable, without status migrainosus: Secondary | ICD-10-CM | POA: Diagnosis not present

## 2017-07-23 MED ORDER — ALPRAZOLAM 0.25 MG PO TABS: 0.2500 mg | ORAL_TABLET | Freq: Three times a day (TID) | ORAL | 0 refills | Status: DC

## 2017-07-23 MED ORDER — CHLORTHALIDONE 25 MG PO TABS: 25.0000 mg | ORAL_TABLET | Freq: Every day | ORAL | 4 refills | Status: DC

## 2017-07-23 MED ORDER — ATORVASTATIN CALCIUM 20 MG PO TABS: 20.0000 mg | ORAL_TABLET | Freq: Every day | ORAL | 3 refills | Status: DC

## 2017-07-23 NOTE — Progress Notes (Signed)
Subjective:    Patient ID: Allison Jimenez, female    DOB: [birth year], 67 y.o.   MRN: [id number]  HPI  67 year old patient who is seen today for her biannual follow-up.  She is followed by neurology with a history of migraine headaches.  She was given a trial of Topamax which was not helpful and she feels that it may have worsened the headaches.  She has self discontinued. She has a history of ongoing tobacco use and earlier in the year underwent chest CT low-dose scanning.  This revealed some emphysematous changes and also coronary artery calcification.  Patient has been on statin therapy in the past.  Risks benefits of statin therapy discussed. She has a history of anxiety disorder and remains on alprazolam.  She takes 1/2 tablet 3 times daily and occasionally a whole tablet at bedtime.  She feels she needs this for anxiety as well as treatment for Mnire's disease.  She also is on chronic diuretic therapy for Mnire's. Otherwise doing well.  She continues to smoke but has decreased considerably. Pulmonary status has been stable  She states that she will be  relocating to a practice near the [place] area    Past Medical History:  Diagnosis Date  . Allergy   . ANXIETY 06/05/2006  . Arthritis    SHOULDERS   . BACK PAIN 01/05/2010  . Cataract    bilateral, left worse  . DIVERTICULOSIS, COLON 06/05/2006  . DIZZINESS OR VERTIGO 06/05/2006   positional  . Hearing loss in left ear   . HYPERLIPIDEMIA 06/05/2006  . HYPOKALEMIA 12/14/2008  . MENIERE'S DISEASE 06/25/2006  . TOBACCO USER 11/25/2007     Social History   Socioeconomic History  . Marital status: Divorced    Spouse name: Not on file  . Number of children: 1  . Years of education: 81  . Highest education level: Not on file  Occupational History    Employer: [employer]    Comment: Retired  Scientific laboratory technician  . Financial resource strain: Not on file  . Food insecurity:    Worry: Not on file    Inability: Not on file   . Transportation needs:    Medical: Not on file    Non-medical: Not on file  Tobacco Use  . Smoking status: Current Every Day Smoker    Packs/day: 1.50    Years: 43.00    Pack years: 64.50    Types: Cigarettes  . Smokeless tobacco: Never Used  . Tobacco comment: using electronic cigs.-12 cigs a day as well  Substance and Sexual Activity  . Alcohol use: No    Alcohol/week: 0.0 oz  . Drug use: No  . Sexual activity: Not on file  Lifestyle  . Physical activity:    Days per week: Not on file    Minutes per session: Not on file  . Stress: Not on file  Relationships  . Social connections:    Talks on phone: Not on file    Gets together: Not on file    Attends religious service: Not on file    Active member of club or organization: Not on file    Attends meetings of clubs or organizations: Not on file    Relationship status: Not on file  . Intimate partner violence:    Fear of current or ex partner: Not on file    Emotionally abused: Not on file    Physically abused: Not on file    Forced sexual activity:  Not on file  Other Topics Concern  . Not on file  Social History Narrative   Patient lives at home alone. Patient has a Data processing manager. Patient is divorced .   Patient is retired.   Education high school.   Right handed.   Caffeine none.    Past Surgical History:  Procedure Laterality Date  . APPENDECTOMY    . CESAREAN SECTION     x1  . CHOLECYSTECTOMY    . COLONOSCOPY    . COSMETIC SURGERY    . mastoid surgery  1992   shunt in mastoid- endolymphatic sac in left ear   . TUBAL LIGATION    . vocal cord surgery     nodule x2    Family History  Problem Relation Age of Onset  . Breast cancer Mother   . Migraines Unknown   . Depression Unknown   . Anxiety disorder Unknown   . High Cholesterol Unknown   . Colon cancer Neg Hx   . Rectal cancer Neg Hx   . Stomach cancer Neg Hx   . Colon polyps Neg Hx   . Esophageal cancer Neg Hx     Allergies    Allergen Reactions  . Erythromycin Base Diarrhea  . Penicillins     Current Outpatient Medications on File Prior to Visit  Medication Sig Dispense Refill  . AMBULATORY NON FORMULARY MEDICATION Medication Name: Nitro gel 0.125%- apply pea size amount 2-3 times daily for anal fissure 30 g 0  . aspirin EC 325 MG tablet Take 325 mg by mouth daily.    . celecoxib (CELEBREX) 200 MG capsule Take 200 mg by mouth daily.  0  . FLUoxetine (PROZAC) 20 MG capsule TAKE 2 CAPSULES BY MOUTH ONCE DAILY. 60 capsule 0  . potassium chloride 20 MEQ/15ML (10%) SOLN take 15 milliliters by mouth once daily 450 mL 1  . promethazine (PHENERGAN) 25 MG tablet TAKE 1 TABLET BY MOUTH EVERY 8 HOURS IF NEEDED 20 tablet 0  . rizatriptan (MAXALT) 10 MG tablet Take 1 tablet (10 mg total) by mouth as needed for migraine. May repeat in 2 hours if needed 10 tablet 0  . topiramate (TOPAMAX) 25 MG tablet Take 1 tablet (25 mg total) by mouth 2 (two) times daily. 120 tablet 3   No current facility-administered medications on file prior to visit.     BP (!) 116/52 (BP Location: Left Arm, Patient Position: Sitting, Cuff Size: Normal)   Pulse 81   Temp 98.1 F (36.7 C) (Oral)   Wt 128 lb 6.4 oz (58.2 kg)   SpO2 97%   BMI 23.87 kg/m     Review of Systems  Constitutional: Negative.   HENT: Negative for congestion, dental problem, hearing loss, rhinorrhea, sinus pressure, sore throat and tinnitus.   Eyes: Negative for pain, discharge and visual disturbance.  Respiratory: Negative for cough and shortness of breath.   Cardiovascular: Negative for chest pain, palpitations and leg swelling.  Gastrointestinal: Negative for abdominal distention, abdominal pain, blood in stool, constipation, diarrhea, nausea and vomiting.  Genitourinary: Negative for difficulty urinating, dysuria, flank pain, frequency, hematuria, pelvic pain, urgency, vaginal bleeding, vaginal discharge and vaginal pain.  Musculoskeletal: Negative for arthralgias,  gait problem and joint swelling.  Skin: Negative for rash.  Neurological: Negative for dizziness, syncope, speech difficulty, weakness, numbness and headaches.  Hematological: Negative for adenopathy.  Psychiatric/Behavioral: Positive for sleep disturbance. Negative for agitation, behavioral problems and dysphoric mood. The patient is nervous/anxious.  Objective:   Physical Exam  Constitutional: She is oriented to person, place, and time. She appears well-developed and well-nourished.  HENT:  Head: Normocephalic.  Right Ear: External ear normal.  Left Ear: External ear normal.  Mouth/Throat: Oropharynx is clear and moist.  Eyes: Pupils are equal, round, and reactive to light. Conjunctivae and EOM are normal.  Neck: Normal range of motion. Neck supple. No thyromegaly present.  Cardiovascular: Normal rate, regular rhythm, normal heart sounds and intact distal pulses.  Pulmonary/Chest: Effort normal and breath sounds normal.  Abdominal: Soft. Bowel sounds are normal. She exhibits no mass. There is no tenderness.  Musculoskeletal: Normal range of motion.  Lymphadenopathy:    She has no cervical adenopathy.  Neurological: She is alert and oriented to person, place, and time.  Skin: Skin is warm and dry. No rash noted.  Psychiatric: She has a normal mood and affect. Her behavior is normal.          Assessment & Plan:   Mnire's disease.  Follow-up ENT.  Continue chlorthalidone.  Medication refilled Anxiety disorder.  Alprazolam refilled Migraine headaches.  Follow-up neurology   Patient will be transferring her medical records to a [place] practice  Marletta Lor

## 2017-07-23 NOTE — Patient Instructions (Signed)
Smoking tobacco is very bad for your health. You should stop smoking immediately.  Return in 6 months for follow-up  GOOD LUCK!!

## 2017-07-25 DIAGNOSIS — M542 Cervicalgia: Secondary | ICD-10-CM | POA: Diagnosis not present

## 2017-07-25 DIAGNOSIS — M9902 Segmental and somatic dysfunction of thoracic region: Secondary | ICD-10-CM | POA: Diagnosis not present

## 2017-07-25 DIAGNOSIS — M9903 Segmental and somatic dysfunction of lumbar region: Secondary | ICD-10-CM | POA: Diagnosis not present

## 2017-07-25 DIAGNOSIS — M5032 Other cervical disc degeneration, mid-cervical region, unspecified level: Secondary | ICD-10-CM | POA: Diagnosis not present

## 2017-07-25 DIAGNOSIS — M5136 Other intervertebral disc degeneration, lumbar region: Secondary | ICD-10-CM | POA: Diagnosis not present

## 2017-07-25 DIAGNOSIS — M9901 Segmental and somatic dysfunction of cervical region: Secondary | ICD-10-CM | POA: Diagnosis not present

## 2017-07-29 ENCOUNTER — Other Ambulatory Visit: Payer: Self-pay | Admitting: Internal Medicine

## 2017-08-01 ENCOUNTER — Ambulatory Visit: Payer: Self-pay | Admitting: Neurology

## 2017-08-19 DIAGNOSIS — H5310 Unspecified subjective visual disturbances: Secondary | ICD-10-CM | POA: Diagnosis not present

## 2017-08-26 DIAGNOSIS — H5310 Unspecified subjective visual disturbances: Secondary | ICD-10-CM | POA: Diagnosis not present

## 2017-08-30 ENCOUNTER — Other Ambulatory Visit: Payer: Self-pay | Admitting: Internal Medicine

## 2017-09-04 DIAGNOSIS — E782 Mixed hyperlipidemia: Secondary | ICD-10-CM | POA: Diagnosis not present

## 2017-09-04 DIAGNOSIS — H8102 Meniere's disease, left ear: Secondary | ICD-10-CM | POA: Diagnosis not present

## 2017-09-04 DIAGNOSIS — G43809 Other migraine, not intractable, without status migrainosus: Secondary | ICD-10-CM | POA: Diagnosis not present

## 2017-09-04 DIAGNOSIS — E876 Hypokalemia: Secondary | ICD-10-CM | POA: Diagnosis not present

## 2017-09-04 DIAGNOSIS — H538 Other visual disturbances: Secondary | ICD-10-CM | POA: Diagnosis not present

## 2017-09-04 DIAGNOSIS — F17219 Nicotine dependence, cigarettes, with unspecified nicotine-induced disorders: Secondary | ICD-10-CM | POA: Diagnosis not present

## 2017-09-04 DIAGNOSIS — F330 Major depressive disorder, recurrent, mild: Secondary | ICD-10-CM | POA: Diagnosis not present

## 2017-09-05 DIAGNOSIS — E782 Mixed hyperlipidemia: Secondary | ICD-10-CM | POA: Diagnosis not present

## 2017-09-19 DIAGNOSIS — H5310 Unspecified subjective visual disturbances: Secondary | ICD-10-CM | POA: Diagnosis not present

## 2017-09-25 ENCOUNTER — Other Ambulatory Visit: Payer: Self-pay | Admitting: Internal Medicine

## 2017-09-25 NOTE — Telephone Encounter (Signed)
Pt stated that she has a new PCP that is refilling this Rx. No further action is needed!

## 2017-10-04 DIAGNOSIS — F330 Major depressive disorder, recurrent, mild: Secondary | ICD-10-CM | POA: Diagnosis not present

## 2017-10-04 DIAGNOSIS — M546 Pain in thoracic spine: Secondary | ICD-10-CM | POA: Diagnosis not present

## 2017-10-04 DIAGNOSIS — R1319 Other dysphagia: Secondary | ICD-10-CM | POA: Diagnosis not present

## 2017-10-04 DIAGNOSIS — E876 Hypokalemia: Secondary | ICD-10-CM | POA: Diagnosis not present

## 2017-10-04 DIAGNOSIS — Z23 Encounter for immunization: Secondary | ICD-10-CM | POA: Diagnosis not present

## 2017-10-04 DIAGNOSIS — G43809 Other migraine, not intractable, without status migrainosus: Secondary | ICD-10-CM | POA: Diagnosis not present

## 2017-10-14 DIAGNOSIS — R51 Headache: Secondary | ICD-10-CM | POA: Diagnosis not present

## 2017-10-14 DIAGNOSIS — H539 Unspecified visual disturbance: Secondary | ICD-10-CM | POA: Diagnosis not present

## 2017-10-14 DIAGNOSIS — H9311 Tinnitus, right ear: Secondary | ICD-10-CM | POA: Diagnosis not present

## 2017-10-17 DIAGNOSIS — H539 Unspecified visual disturbance: Secondary | ICD-10-CM | POA: Diagnosis not present

## 2017-10-28 DIAGNOSIS — G479 Sleep disorder, unspecified: Secondary | ICD-10-CM | POA: Diagnosis not present

## 2017-10-28 DIAGNOSIS — R51 Headache: Secondary | ICD-10-CM | POA: Diagnosis not present

## 2017-11-11 DIAGNOSIS — M25512 Pain in left shoulder: Secondary | ICD-10-CM | POA: Diagnosis not present

## 2017-11-14 DIAGNOSIS — M25512 Pain in left shoulder: Secondary | ICD-10-CM | POA: Diagnosis not present

## 2017-11-14 DIAGNOSIS — M7542 Impingement syndrome of left shoulder: Secondary | ICD-10-CM | POA: Diagnosis not present

## 2017-11-14 DIAGNOSIS — M6281 Muscle weakness (generalized): Secondary | ICD-10-CM | POA: Diagnosis not present

## 2017-11-15 DIAGNOSIS — J00 Acute nasopharyngitis [common cold]: Secondary | ICD-10-CM | POA: Diagnosis not present

## 2017-12-31 DIAGNOSIS — S76319A Strain of muscle, fascia and tendon of the posterior muscle group at thigh level, unspecified thigh, initial encounter: Secondary | ICD-10-CM | POA: Diagnosis not present

## 2017-12-31 DIAGNOSIS — J3089 Other allergic rhinitis: Secondary | ICD-10-CM | POA: Diagnosis not present

## 2018-01-08 DIAGNOSIS — F330 Major depressive disorder, recurrent, mild: Secondary | ICD-10-CM | POA: Diagnosis not present

## 2018-01-08 DIAGNOSIS — E782 Mixed hyperlipidemia: Secondary | ICD-10-CM | POA: Diagnosis not present

## 2018-01-08 DIAGNOSIS — J432 Centrilobular emphysema: Secondary | ICD-10-CM | POA: Diagnosis not present

## 2018-01-08 DIAGNOSIS — G43809 Other migraine, not intractable, without status migrainosus: Secondary | ICD-10-CM | POA: Diagnosis not present

## 2018-01-08 DIAGNOSIS — F17219 Nicotine dependence, cigarettes, with unspecified nicotine-induced disorders: Secondary | ICD-10-CM | POA: Diagnosis not present

## 2018-01-28 DIAGNOSIS — R51 Headache: Secondary | ICD-10-CM | POA: Diagnosis not present

## 2018-01-28 DIAGNOSIS — M542 Cervicalgia: Secondary | ICD-10-CM | POA: Diagnosis not present

## 2018-02-19 DIAGNOSIS — J301 Allergic rhinitis due to pollen: Secondary | ICD-10-CM | POA: Diagnosis not present

## 2018-02-19 DIAGNOSIS — J320 Chronic maxillary sinusitis: Secondary | ICD-10-CM | POA: Diagnosis not present

## 2018-02-19 DIAGNOSIS — J322 Chronic ethmoidal sinusitis: Secondary | ICD-10-CM | POA: Diagnosis not present

## 2018-02-19 DIAGNOSIS — J370 Chronic laryngitis: Secondary | ICD-10-CM | POA: Diagnosis not present

## 2018-02-25 ENCOUNTER — Inpatient Hospital Stay: Admission: RE | Admit: 2018-02-25 | Payer: Self-pay | Source: Ambulatory Visit

## 2018-03-04 ENCOUNTER — Ambulatory Visit (INDEPENDENT_AMBULATORY_CARE_PROVIDER_SITE_OTHER)
Admission: RE | Admit: 2018-03-04 | Discharge: 2018-03-04 | Disposition: A | Discharge status: Home / Self Care | Payer: Medicare Other | Source: Ambulatory Visit | Attending: Acute Care | Admitting: Acute Care

## 2018-03-04 DIAGNOSIS — Z122 Encounter for screening for malignant neoplasm of respiratory organs: Secondary | ICD-10-CM

## 2018-03-04 DIAGNOSIS — F17210 Nicotine dependence, cigarettes, uncomplicated: Secondary | ICD-10-CM | POA: Diagnosis not present

## 2018-03-06 ENCOUNTER — Other Ambulatory Visit: Payer: Self-pay | Admitting: Acute Care

## 2018-03-06 DIAGNOSIS — K6389 Other specified diseases of intestine: Secondary | ICD-10-CM | POA: Diagnosis not present

## 2018-03-06 DIAGNOSIS — K5901 Slow transit constipation: Secondary | ICD-10-CM | POA: Diagnosis not present

## 2018-03-06 DIAGNOSIS — F17210 Nicotine dependence, cigarettes, uncomplicated: Principal | ICD-10-CM

## 2018-03-06 DIAGNOSIS — R1084 Generalized abdominal pain: Secondary | ICD-10-CM | POA: Diagnosis not present

## 2018-03-06 DIAGNOSIS — R1905 Periumbilic swelling, mass or lump: Secondary | ICD-10-CM | POA: Diagnosis not present

## 2018-03-06 DIAGNOSIS — Z122 Encounter for screening for malignant neoplasm of respiratory organs: Secondary | ICD-10-CM

## 2018-03-06 DIAGNOSIS — Z87891 Personal history of nicotine dependence: Secondary | ICD-10-CM

## 2018-03-06 DIAGNOSIS — R10815 Periumbilic abdominal tenderness: Secondary | ICD-10-CM | POA: Diagnosis not present

## 2018-03-07 DIAGNOSIS — I714 Abdominal aortic aneurysm, without rupture: Secondary | ICD-10-CM | POA: Diagnosis not present

## 2018-03-07 DIAGNOSIS — R1030 Lower abdominal pain, unspecified: Secondary | ICD-10-CM | POA: Diagnosis not present

## 2018-04-10 DIAGNOSIS — J0101 Acute recurrent maxillary sinusitis: Secondary | ICD-10-CM | POA: Diagnosis not present

## 2018-04-23 DIAGNOSIS — Z1159 Encounter for screening for other viral diseases: Secondary | ICD-10-CM | POA: Diagnosis not present

## 2018-04-23 DIAGNOSIS — K5904 Chronic idiopathic constipation: Secondary | ICD-10-CM | POA: Diagnosis not present

## 2018-04-23 DIAGNOSIS — N952 Postmenopausal atrophic vaginitis: Secondary | ICD-10-CM | POA: Diagnosis not present

## 2018-04-23 DIAGNOSIS — G43009 Migraine without aura, not intractable, without status migrainosus: Secondary | ICD-10-CM | POA: Diagnosis not present

## 2018-04-23 DIAGNOSIS — E782 Mixed hyperlipidemia: Secondary | ICD-10-CM | POA: Diagnosis not present

## 2018-04-23 DIAGNOSIS — H8102 Meniere's disease, left ear: Secondary | ICD-10-CM | POA: Diagnosis not present

## 2018-04-23 DIAGNOSIS — F17219 Nicotine dependence, cigarettes, with unspecified nicotine-induced disorders: Secondary | ICD-10-CM | POA: Diagnosis not present

## 2018-04-23 DIAGNOSIS — F330 Major depressive disorder, recurrent, mild: Secondary | ICD-10-CM | POA: Diagnosis not present

## 2018-04-29 DIAGNOSIS — M542 Cervicalgia: Secondary | ICD-10-CM | POA: Diagnosis not present

## 2018-04-29 DIAGNOSIS — R51 Headache: Secondary | ICD-10-CM | POA: Diagnosis not present

## 2018-06-03 DIAGNOSIS — J018 Other acute sinusitis: Secondary | ICD-10-CM | POA: Diagnosis not present

## 2018-06-03 DIAGNOSIS — F17219 Nicotine dependence, cigarettes, with unspecified nicotine-induced disorders: Secondary | ICD-10-CM | POA: Diagnosis not present

## 2018-06-03 DIAGNOSIS — J449 Chronic obstructive pulmonary disease, unspecified: Secondary | ICD-10-CM | POA: Diagnosis not present

## 2018-07-08 DIAGNOSIS — J9801 Acute bronchospasm: Secondary | ICD-10-CM | POA: Diagnosis not present

## 2018-07-08 DIAGNOSIS — J060 Acute laryngopharyngitis: Secondary | ICD-10-CM | POA: Diagnosis not present

## 2018-07-29 DIAGNOSIS — K5904 Chronic idiopathic constipation: Secondary | ICD-10-CM | POA: Diagnosis not present

## 2018-07-29 DIAGNOSIS — J0191 Acute recurrent sinusitis, unspecified: Secondary | ICD-10-CM | POA: Diagnosis not present

## 2018-07-29 DIAGNOSIS — G43009 Migraine without aura, not intractable, without status migrainosus: Secondary | ICD-10-CM | POA: Diagnosis not present

## 2018-07-29 DIAGNOSIS — F330 Major depressive disorder, recurrent, mild: Secondary | ICD-10-CM | POA: Diagnosis not present

## 2018-07-29 DIAGNOSIS — E782 Mixed hyperlipidemia: Secondary | ICD-10-CM | POA: Diagnosis not present

## 2018-07-29 DIAGNOSIS — Z1159 Encounter for screening for other viral diseases: Secondary | ICD-10-CM | POA: Diagnosis not present

## 2018-07-29 DIAGNOSIS — F17219 Nicotine dependence, cigarettes, with unspecified nicotine-induced disorders: Secondary | ICD-10-CM | POA: Diagnosis not present

## 2018-07-30 DIAGNOSIS — M542 Cervicalgia: Secondary | ICD-10-CM | POA: Diagnosis not present

## 2018-07-30 DIAGNOSIS — R51 Headache: Secondary | ICD-10-CM | POA: Diagnosis not present

## 2018-08-13 DIAGNOSIS — E782 Mixed hyperlipidemia: Secondary | ICD-10-CM | POA: Diagnosis not present

## 2018-08-13 DIAGNOSIS — N3000 Acute cystitis without hematuria: Secondary | ICD-10-CM | POA: Diagnosis not present

## 2018-08-13 DIAGNOSIS — B373 Candidiasis of vulva and vagina: Secondary | ICD-10-CM | POA: Diagnosis not present

## 2018-08-13 DIAGNOSIS — J018 Other acute sinusitis: Secondary | ICD-10-CM | POA: Diagnosis not present

## 2018-08-19 DIAGNOSIS — N3000 Acute cystitis without hematuria: Secondary | ICD-10-CM | POA: Diagnosis not present

## 2018-09-10 DIAGNOSIS — F330 Major depressive disorder, recurrent, mild: Secondary | ICD-10-CM | POA: Insufficient documentation

## 2018-09-11 DIAGNOSIS — N760 Acute vaginitis: Secondary | ICD-10-CM | POA: Diagnosis not present

## 2018-09-11 DIAGNOSIS — Z1231 Encounter for screening mammogram for malignant neoplasm of breast: Secondary | ICD-10-CM | POA: Diagnosis not present

## 2018-09-11 DIAGNOSIS — Z6825 Body mass index (BMI) 25.0-25.9, adult: Secondary | ICD-10-CM | POA: Diagnosis not present

## 2018-09-11 DIAGNOSIS — Z01419 Encounter for gynecological examination (general) (routine) without abnormal findings: Secondary | ICD-10-CM | POA: Diagnosis not present

## 2018-09-25 DIAGNOSIS — J018 Other acute sinusitis: Secondary | ICD-10-CM | POA: Diagnosis not present

## 2018-10-30 DIAGNOSIS — E782 Mixed hyperlipidemia: Secondary | ICD-10-CM | POA: Diagnosis not present

## 2018-10-30 DIAGNOSIS — F17219 Nicotine dependence, cigarettes, with unspecified nicotine-induced disorders: Secondary | ICD-10-CM | POA: Diagnosis not present

## 2018-10-30 DIAGNOSIS — J432 Centrilobular emphysema: Secondary | ICD-10-CM | POA: Diagnosis not present

## 2018-10-30 DIAGNOSIS — F330 Major depressive disorder, recurrent, mild: Secondary | ICD-10-CM | POA: Diagnosis not present

## 2018-10-30 DIAGNOSIS — G43009 Migraine without aura, not intractable, without status migrainosus: Secondary | ICD-10-CM | POA: Diagnosis not present

## 2018-11-18 DIAGNOSIS — E782 Mixed hyperlipidemia: Secondary | ICD-10-CM | POA: Diagnosis not present

## 2018-12-09 DIAGNOSIS — Z20828 Contact with and (suspected) exposure to other viral communicable diseases: Secondary | ICD-10-CM | POA: Diagnosis not present

## 2018-12-17 DIAGNOSIS — J018 Other acute sinusitis: Secondary | ICD-10-CM | POA: Diagnosis not present

## 2018-12-17 DIAGNOSIS — Z1159 Encounter for screening for other viral diseases: Secondary | ICD-10-CM | POA: Diagnosis not present

## 2019-02-19 ENCOUNTER — Ambulatory Visit: Payer: Medicare Other | Admitting: Family Medicine

## 2019-02-23 DIAGNOSIS — J309 Allergic rhinitis, unspecified: Secondary | ICD-10-CM | POA: Diagnosis not present

## 2019-02-23 DIAGNOSIS — J343 Hypertrophy of nasal turbinates: Secondary | ICD-10-CM | POA: Diagnosis not present

## 2019-02-23 DIAGNOSIS — J342 Deviated nasal septum: Secondary | ICD-10-CM | POA: Diagnosis not present

## 2019-02-23 DIAGNOSIS — Z8709 Personal history of other diseases of the respiratory system: Secondary | ICD-10-CM | POA: Diagnosis not present

## 2019-02-23 DIAGNOSIS — H61303 Acquired stenosis of external ear canal, unspecified, bilateral: Secondary | ICD-10-CM | POA: Diagnosis not present

## 2019-02-23 DIAGNOSIS — H938X9 Other specified disorders of ear, unspecified ear: Secondary | ICD-10-CM | POA: Diagnosis not present

## 2019-02-23 DIAGNOSIS — H8102 Meniere's disease, left ear: Secondary | ICD-10-CM | POA: Diagnosis not present

## 2019-02-23 DIAGNOSIS — H6123 Impacted cerumen, bilateral: Secondary | ICD-10-CM | POA: Diagnosis not present

## 2019-02-23 DIAGNOSIS — H9312 Tinnitus, left ear: Secondary | ICD-10-CM | POA: Diagnosis not present

## 2019-02-27 ENCOUNTER — Other Ambulatory Visit: Payer: Self-pay

## 2019-02-27 ENCOUNTER — Encounter: Payer: Self-pay | Admitting: Family Medicine

## 2019-02-27 ENCOUNTER — Ambulatory Visit: Payer: Medicare PPO | Admitting: Family Medicine

## 2019-02-27 VITALS — BP 122/68 | HR 78 | Temp 98.2°F | Ht 61.5 in | Wt 129.0 lb

## 2019-02-27 DIAGNOSIS — G43009 Migraine without aura, not intractable, without status migrainosus: Secondary | ICD-10-CM | POA: Diagnosis not present

## 2019-02-27 DIAGNOSIS — J301 Allergic rhinitis due to pollen: Secondary | ICD-10-CM | POA: Diagnosis not present

## 2019-02-27 DIAGNOSIS — F330 Major depressive disorder, recurrent, mild: Secondary | ICD-10-CM

## 2019-02-27 DIAGNOSIS — E785 Hyperlipidemia, unspecified: Secondary | ICD-10-CM

## 2019-02-27 DIAGNOSIS — Z122 Encounter for screening for malignant neoplasm of respiratory organs: Secondary | ICD-10-CM

## 2019-02-27 MED ORDER — ONDANSETRON HCL 4 MG PO TABS: 4.0000 mg | ORAL_TABLET | Freq: Three times a day (TID) | ORAL | 0 refills | Status: DC | PRN

## 2019-02-27 NOTE — Progress Notes (Signed)
Subjective:  Patient ID: Allison Jimenez, female    DOB: 07-04-1950  Age: 69 y.o. MRN: [id number]  Chief Complaint  Patient presents with  . Hyperlipidemia  . Hypertension  . Constipation    HPI Patient is a 69 year old white female who presents for follow-up of hyperlipidemia, hypertension and constipation.  She is eating healthy however not exercising.  Her current cholesterol medicines are atorvastatin 20 mg twice weekly.  She was supposed to be taking fenofibrate however she is unable to swallow these due to the size of the pills.  Patient's hypertension is well controlled on chlorthalidone 25 mg once daily.  Her constipation is well controlled on Linzess 145 mg 1 daily and occasionally takes MiraLAX.  The patient also suffers from chronic migraines for which she sees neurology.  She says these are fairly well controlled on Aimovig and Maxalt.  Patient has depression and anxiety which are also well controlled on fluoxetine 20 mg 2 daily and Xanax 0.25 mg 1 pill 3 times a day as needed.  Patient is a chronic smoker and does not have significant desire to quit.  She has postnasal drainage and rhinorrhea which are her main complaints today.  She smokes 11 cigarettes/day which she says is down.  She is no longer vaping.  She is due for her low contrast lung CT scan for lung cancer screening.   Social History   Socioeconomic History  . Marital status: Divorced    Spouse name: Not on file  . Number of children: 1  . Years of education: 62  . Highest education level: Not on file  Occupational History    Employer: [employer]    Comment: Retired  Tobacco Use  . Smoking status: Current Every Day Smoker    Packs/day: 1.50    Years: 43.00    Pack years: 64.50    Types: Cigarettes  . Smokeless tobacco: Never Used  . Tobacco comment: using electronic cigs.-12 cigs a day as well  Substance and Sexual Activity  . Alcohol use: No    Alcohol/week: 0.0 standard drinks  . Drug  use: No  . Sexual activity: Not on file  Other Topics Concern  . Not on file  Social History Narrative   Patient lives at home alone. Patient has a Data processing manager. Patient is divorced .   Patient is retired.   Education high school.   Right handed.   Caffeine none.   Social Determinants of Health   Financial Resource Strain:   . Difficulty of Paying Living Expenses: Not on file  Food Insecurity:   . Worried About Charity fundraiser in the Last Year: Not on file  . Ran Out of Food in the Last Year: Not on file  Transportation Needs:   . Lack of Transportation (Medical): Not on file  . Lack of Transportation (Non-Medical): Not on file  Physical Activity:   . Days of Exercise per Week: Not on file  . Minutes of Exercise per Session: Not on file  Stress:   . Feeling of Stress : Not on file  Social Connections:   . Frequency of Communication with Friends and Family: Not on file  . Frequency of Social Gatherings with Friends and Family: Not on file  . Attends Religious Services: Not on file  . Active Member of Clubs or Organizations: Not on file  . Attends Archivist Meetings: Not on file  . Marital Status: Not on file   Past  Medical History:  Diagnosis Date  . Allergy   . ANXIETY 06/05/2006  . Arthritis    SHOULDERS   . BACK PAIN 01/05/2010  . Cataract    bilateral, left worse  . Centrilobular emphysema ([place])   . COPD (chronic obstructive pulmonary disease) ([place])   . DIVERTICULOSIS, COLON 06/05/2006  . DIZZINESS OR VERTIGO 06/05/2006   positional  . Hearing loss in left ear   . HYPERLIPIDEMIA 06/05/2006  . HYPOKALEMIA 12/14/2008  . MENIERE'S DISEASE 06/25/2006  . Migraine   . TOBACCO USER 11/25/2007   Family History  Problem Relation Age of Onset  . Breast cancer Mother   . Migraines Other   . Depression Other   . Anxiety disorder Other   . High Cholesterol Other   . Colon cancer Neg Hx   . Rectal cancer Neg Hx   . Stomach cancer Neg Hx   . Colon  polyps Neg Hx   . Esophageal cancer Neg Hx     Review of Systems  Constitutional: Negative for chills, fatigue and fever.  HENT: Positive for postnasal drip. Negative for congestion, ear pain and sore throat.   Respiratory: Negative for cough and shortness of breath.   Cardiovascular: Negative for chest pain.  Gastrointestinal: Negative for abdominal pain, constipation, diarrhea, nausea and vomiting.  Endocrine: Negative for polydipsia, polyphagia and polyuria.  Genitourinary: Negative for dysuria and urgency.  Musculoskeletal: Negative for arthralgias and myalgias.  Neurological: Positive for headaches. Negative for dizziness.       Sees neurology.   Psychiatric/Behavioral: Negative for dysphoric mood. The patient is not nervous/anxious.      Objective:  BP 122/68 (BP Location: Right Arm, Patient Position: Sitting)   Pulse 78   Temp 98.2 F (36.8 C) (Temporal)   Ht 5' 1.5" (1.562 m)   Wt 129 lb (58.5 kg)   SpO2 97%   BMI 23.98 kg/m   BP/Weight 02/27/2019 07/23/2017 [id number]  Systolic BP [id number]  Diastolic BP 68 52 62  Wt. (Lbs) 129 128.4 131  BMI 23.98 23.87 24.35    Physical Exam     Physical Exam Vitals reviewed.  Constitutional:      Appearance: Normal appearance.  Heent: Nose pale edema (mild), TMs normal, Throat - mild drainage -cobblestoning, but no erythema or exudate.  Neck:     Vascular: No carotid bruit.  Cardiovascular:     Rate and Rhythm: Normal rate and regular rhythm.     Pulses: Normal pulses.     Heart sounds: Normal heart sounds.  Pulmonary:     Effort: Pulmonary effort is normal.     Breath sounds: Normal breath sounds. No wheezing, rhonchi or rales.  Abdominal:     General: Bowel sounds are normal.     Palpations: Abdomen is soft.     Tenderness: There is no abdominal tenderness.  Neurological:     Mental Status: ALERT Psychiatric:        Mood and Affect: Mood normal.        Behavior: Behavior normal.    Lab Results  Component  Value Date   WBC 8.2 07/09/2016   HGB 14.2 07/09/2016   HCT 41.1 07/09/2016   PLT 334.0 07/09/2016   GLUCOSE 100 (H) 07/09/2016   CHOL 240 (H) 01/24/2017   TRIG 104.0 01/24/2017   HDL 58.80 01/24/2017   LDLDIRECT 151.9 09/13/2011   LDLCALC 160 (H) 01/24/2017   ALT 14 07/09/2016   AST 16 07/09/2016   NA 135 07/09/2016  K 3.6 07/09/2016   CL 96 07/09/2016   CREATININE 0.81 07/09/2016   BUN 16 07/09/2016   CO2 32 07/09/2016   TSH 1.58 07/09/2016   HGBA1C 6.5 (H) 06/18/2006      Assessment & Plan:  Migraine without status migrainosus, not intractable Fairly well controlled.  Follow-up with neurology regularly.  Dyslipidemia Checking cholesterol today.  Unsure if controlled.  Recommend low-fat diet and exercise.  Major depressive disorder, recurrent episode, mild ([place]) Well-controlled.  Continue current medications.  Non-seasonal allergic rhinitis due to pollen Recommend try alternate antihistamine.  Encounter for screening for lung cancer CT low contrast lungs ordered.   Follow-up: No follow-ups on file.  AVS was given to patient prior to departure.  Rochel Brome [facility] [phone]

## 2019-02-27 NOTE — Patient Instructions (Signed)
No changes to medicines.  Labs drawn. Ct of lungs for lung cancer screening ordered . Continue low fat diet and exercise. Consider alternative antihistamine for allergies. Allegra, Claritin or Zyrtec.

## 2019-02-28 LAB — COMP. METABOLIC PANEL (12)
AST: 18 IU/L (ref 0–40)
Albumin/Globulin Ratio: 2 (ref 1.2–2.2)
Albumin: 4.6 g/dL (ref 3.8–4.8)
Alkaline Phosphatase: 66 IU/L (ref 39–117)
BUN/Creatinine Ratio: 14 (ref 12–28)
BUN: 11 mg/dL (ref 8–27)
Bilirubin Total: 0.4 mg/dL (ref 0.0–1.2)
Calcium: 10.2 mg/dL (ref 8.7–10.3)
Chloride: 92 mmol/L — ABNORMAL LOW (ref 96–106)
Creatinine, Ser: 0.76 mg/dL (ref 0.57–1.00)
GFR calc Af Amer: 94 mL/min/{1.73_m2} (ref >59)
GFR calc non Af Amer: 81 mL/min/{1.73_m2} (ref >59)
Globulin, Total: 2.3 g/dL (ref 1.5–4.5)
Glucose: 95 mg/dL (ref 65–99)
Potassium: 4 mmol/L (ref 3.5–5.2)
Sodium: 134 mmol/L (ref 134–144)
Total Protein: 6.9 g/dL (ref 6.0–8.5)

## 2019-02-28 LAB — CBC WITH DIFFERENTIAL/PLATELET
Basophils Absolute: 0.1 10*3/uL (ref 0.0–0.2)
Basos: 1 %
EOS (ABSOLUTE): 0.5 10*3/uL — ABNORMAL HIGH (ref 0.0–0.4)
Eos: 6 %
Hematocrit: 39.4 % (ref 34.0–46.6)
Hemoglobin: 13.7 g/dL (ref 11.1–15.9)
Immature Grans (Abs): 0 10*3/uL (ref 0.0–0.1)
Immature Granulocytes: 0 %
Lymphocytes Absolute: 2.3 10*3/uL (ref 0.7–3.1)
Lymphs: 27 %
MCH: 30.9 pg (ref 26.6–33.0)
MCHC: 34.8 g/dL (ref 31.5–35.7)
MCV: 89 fL (ref 79–97)
Monocytes Absolute: 0.8 10*3/uL (ref 0.1–0.9)
Monocytes: 9 %
Neutrophils Absolute: 4.8 10*3/uL (ref 1.4–7.0)
Neutrophils: 57 %
Platelets: 362 10*3/uL (ref 150–450)
RBC: 4.43 x10E6/uL (ref 3.77–5.28)
RDW: 12.9 % (ref 11.7–15.4)
WBC: 8.5 10*3/uL (ref 3.4–10.8)

## 2019-02-28 LAB — LIPID PANEL
Chol/HDL Ratio: 3 ratio (ref 0.0–4.4)
Cholesterol, Total: 125 mg/dL (ref 100–199)
HDL: 42 mg/dL (ref >39)
LDL Chol Calc (NIH): 58 mg/dL (ref 0–99)
Triglycerides: 144 mg/dL (ref 0–149)
VLDL Cholesterol Cal: 25 mg/dL (ref 5–40)

## 2019-02-28 LAB — CARDIOVASCULAR RISK ASSESSMENT

## 2019-03-01 ENCOUNTER — Encounter: Payer: Self-pay | Admitting: Family Medicine

## 2019-03-01 DIAGNOSIS — J301 Allergic rhinitis due to pollen: Secondary | ICD-10-CM | POA: Insufficient documentation

## 2019-03-01 DIAGNOSIS — Z122 Encounter for screening for malignant neoplasm of respiratory organs: Secondary | ICD-10-CM | POA: Insufficient documentation

## 2019-03-01 NOTE — Assessment & Plan Note (Signed)
Fairly well controlled.  Follow-up with neurology regularly.

## 2019-03-01 NOTE — Assessment & Plan Note (Signed)
CT low contrast lungs ordered.

## 2019-03-01 NOTE — Assessment & Plan Note (Signed)
Well controlled. Continue current medications  

## 2019-03-01 NOTE — Assessment & Plan Note (Signed)
Checking cholesterol today.  Unsure if controlled.  Recommend low-fat diet and exercise.

## 2019-03-01 NOTE — Assessment & Plan Note (Signed)
Recommend try alternate antihistamine.

## 2019-03-04 ENCOUNTER — Other Ambulatory Visit: Payer: Self-pay | Admitting: Family Medicine

## 2019-03-08 ENCOUNTER — Other Ambulatory Visit: Payer: Self-pay | Admitting: Family Medicine

## 2019-03-12 ENCOUNTER — Other Ambulatory Visit: Payer: Self-pay | Admitting: Family Medicine

## 2019-03-13 ENCOUNTER — Other Ambulatory Visit: Payer: Self-pay

## 2019-03-13 ENCOUNTER — Ambulatory Visit
Admission: RE | Admit: 2019-03-13 | Discharge: 2019-03-13 | Disposition: A | Discharge status: Home / Self Care | Payer: Medicare PPO | Source: Ambulatory Visit | Attending: Family Medicine | Admitting: Family Medicine

## 2019-03-13 ENCOUNTER — Ambulatory Visit (INDEPENDENT_AMBULATORY_CARE_PROVIDER_SITE_OTHER)
Admission: RE | Admit: 2019-03-13 | Discharge: 2019-03-13 | Disposition: A | Discharge status: Home / Self Care | Payer: Medicare PPO | Source: Ambulatory Visit

## 2019-03-13 DIAGNOSIS — Z122 Encounter for screening for malignant neoplasm of respiratory organs: Secondary | ICD-10-CM

## 2019-03-13 DIAGNOSIS — F17210 Nicotine dependence, cigarettes, uncomplicated: Secondary | ICD-10-CM

## 2019-03-13 DIAGNOSIS — Z87891 Personal history of nicotine dependence: Secondary | ICD-10-CM | POA: Diagnosis not present

## 2019-03-24 ENCOUNTER — Other Ambulatory Visit: Payer: Self-pay | Admitting: *Deleted

## 2019-03-24 DIAGNOSIS — F17210 Nicotine dependence, cigarettes, uncomplicated: Secondary | ICD-10-CM

## 2019-03-24 DIAGNOSIS — Z122 Encounter for screening for malignant neoplasm of respiratory organs: Secondary | ICD-10-CM

## 2019-03-24 NOTE — Progress Notes (Signed)
Please call patient and let them  know their  low dose Ct was read as a Lung RADS 1, negative study: no nodules or definitely benign nodules. Radiology recommendation is for a repeat LDCT in 12 months. .Please let them  know we will order and schedule their  annual screening scan for 03/2020. Please let them  know there was notation of CAD on their  scan.  Please remind the patient  that this is a non-gated exam therefore degree or severity of disease  cannot be determined. Please have them  follow up with their PCP regarding potential risk factor modification, dietary therapy or pharmacologic therapy if clinically indicated. Pt.  is currently on statin therapy. Please place order for annual  screening scan for  03/2020 and fax results to PCP. Thanks so much.

## 2019-03-26 ENCOUNTER — Telehealth: Payer: Self-pay | Admitting: Acute Care

## 2019-03-26 DIAGNOSIS — Z87891 Personal history of nicotine dependence: Secondary | ICD-10-CM

## 2019-03-26 DIAGNOSIS — F17210 Nicotine dependence, cigarettes, uncomplicated: Secondary | ICD-10-CM

## 2019-03-26 NOTE — Telephone Encounter (Signed)
Somehow, this result ( of a scan I ordered) was sent to her PCP in [place] and not to me.  Is there any way we can look into what happened here. If this was done at [facility] they need to know they cannot do this. The results have to go to the ordering provider.   Langley Gauss, please call the patient with the results.  Lung RADS 1, negative study: no nodules or definitely benign nodules. Radiology recommendation is for a repeat LDCT in 12 months. Fax to PCP and place order for 12 month follow up. Thanks so much    Mediastinum/Nodes: No mediastinal or definite hilar adenopathy, given limitations of unenhanced CT.  Lungs/Pleura: No pleural fluid. Moderate centrilobular emphysema. Right lower lobe calcified granuloma. Lingular scarring.  Upper Abdomen: Cholecystectomy. Normal imaged portions of the liver, spleen, stomach, pancreas, adrenal glands, kidneys.  Musculoskeletal: Remote fourth lateral right rib fracture.  IMPRESSION: Lung-RADS 1, negative. Continue annual screening with low-dose chest CT without contrast in 12 months.  Aortic atherosclerosis (ICD10-I70.0), coronary artery atherosclerosis and emphysema (ICD10-J43.9).

## 2019-03-26 NOTE — Telephone Encounter (Signed)
Dr. Tobie Poet, [facility] has been following this patient for her low dose CT screenings since 2019. Recently you cancelled my order for the scan and ordered you own scan. As a result I did not get her screening results. This is always concerning as I fear there will be patients who slip through the cracks, and their scan results will not get called, or there is not clarity regarding who is taking responsibility for annual screening.  We will place her follow up screening scan for 03/2020, and continue to follow her annually. Thanks so much

## 2019-03-30 NOTE — Telephone Encounter (Signed)
Pt informed of CT results per Sarah Groce, NP.  PT verbalized understanding.  Copy sent to PCP.  Order placed for 1 yr f/u CT.  

## 2019-03-30 NOTE — Addendum Note (Signed)
Addended by: Doroteo Glassman D on: 03/30/2019 09:26 AM   Modules accepted: Orders

## 2019-04-01 ENCOUNTER — Other Ambulatory Visit: Payer: Self-pay | Admitting: Family Medicine

## 2019-05-11 ENCOUNTER — Other Ambulatory Visit: Payer: Self-pay | Admitting: Family Medicine

## 2019-05-12 ENCOUNTER — Ambulatory Visit: Payer: Medicare PPO | Admitting: Family Medicine

## 2019-05-12 ENCOUNTER — Encounter: Payer: Self-pay | Admitting: Family Medicine

## 2019-05-12 ENCOUNTER — Other Ambulatory Visit: Payer: Self-pay

## 2019-05-12 VITALS — BP 118/62 | HR 95 | Temp 97.3°F | Ht 61.5 in | Wt 131.0 lb

## 2019-05-12 DIAGNOSIS — J301 Allergic rhinitis due to pollen: Secondary | ICD-10-CM

## 2019-05-12 DIAGNOSIS — J0100 Acute maxillary sinusitis, unspecified: Secondary | ICD-10-CM | POA: Insufficient documentation

## 2019-05-12 DIAGNOSIS — J329 Chronic sinusitis, unspecified: Secondary | ICD-10-CM | POA: Insufficient documentation

## 2019-05-12 DIAGNOSIS — J019 Acute sinusitis, unspecified: Secondary | ICD-10-CM | POA: Insufficient documentation

## 2019-05-12 DIAGNOSIS — J018 Other acute sinusitis: Secondary | ICD-10-CM

## 2019-05-12 MED ORDER — DOXYCYCLINE HYCLATE 100 MG PO TABS: 100.0000 mg | ORAL_TABLET | Freq: Two times a day (BID) | ORAL | 0 refills | Status: DC

## 2019-05-12 MED ORDER — TRIAMCINOLONE ACETONIDE 40 MG/ML IJ SUSP
80.0000 mg | Freq: Once | INTRAMUSCULAR | Status: AC
  Administered 2019-05-12: 80 mg via INTRAMUSCULAR

## 2019-05-12 NOTE — Progress Notes (Signed)
Acute Office Visit  Subjective:    Patient ID: Allison Jimenez, female    DOB: 1950-09-25, 69 y.o.   MRN: [id number]  Chief Complaint  Patient presents with  . Allergies    x 2-3 months    HPI Patient is in today complaining of sinusitis. She is complaining of postnasal drainage, cough, clear to yellow sputum, sinus pressure, sneezing for 2- 3 times month. She is taking zyrtec and nasocort.   She saw Dr. Gaylyn Cheers in 03/2019 and he did not feel she needed an antibiotic.   Past Medical History:  Diagnosis Date  . Allergy   . ANXIETY 06/05/2006  . Arthritis    SHOULDERS   . BACK PAIN 01/05/2010  . Cataract    bilateral, left worse  . Centrilobular emphysema ([place])   . COPD (chronic obstructive pulmonary disease) ([place])   . DIVERTICULOSIS, COLON 06/05/2006  . DIZZINESS OR VERTIGO 06/05/2006   positional  . Hearing loss in left ear   . HYPERLIPIDEMIA 06/05/2006  . HYPOKALEMIA 12/14/2008  . MENIERE'S DISEASE 06/25/2006  . Migraine   . TOBACCO USER 11/25/2007    Past Surgical History:  Procedure Laterality Date  . APPENDECTOMY    . CESAREAN SECTION     x1  . CHOLECYSTECTOMY    . COLONOSCOPY    . COSMETIC SURGERY    . mastoid surgery  1992   shunt in mastoid- endolymphatic sac in left ear   . TUBAL LIGATION    . vocal cord surgery     nodule x2    Family History  Problem Relation Age of Onset  . Breast cancer Mother   . Migraines Other   . Depression Other   . Anxiety disorder Other   . High Cholesterol Other   . Colon cancer Neg Hx   . Rectal cancer Neg Hx   . Stomach cancer Neg Hx   . Colon polyps Neg Hx   . Esophageal cancer Neg Hx     Social History   Socioeconomic History  . Marital status: Divorced    Spouse name: Not on file  . Number of children: 1  . Years of education: 8  . Highest education level: Not on file  Occupational History    Employer: [employer]    Comment: Retired  Tobacco Use  . Smoking status: Current Every Day Smoker   Packs/day: 1.50    Years: 43.00    Pack years: 64.50    Types: Cigarettes  . Smokeless tobacco: Never Used  . Tobacco comment: using electronic cigs.-12 cigs a day as well  Substance and Sexual Activity  . Alcohol use: No    Alcohol/week: 0.0 standard drinks  . Drug use: No  . Sexual activity: Not on file  Other Topics Concern  . Not on file  Social History Narrative   Patient lives at home alone. Patient has a Data processing manager. Patient is divorced .   Patient is retired.   Education high school.   Right handed.   Caffeine none.   Social Determinants of Health   Financial Resource Strain:   . Difficulty of Paying Living Expenses:   Food Insecurity:   . Worried About Charity fundraiser in the Last Year:   . Arboriculturist in the Last Year:   Transportation Needs:   . Film/video editor (Medical):   Marland Kitchen Lack of Transportation (Non-Medical):   Physical Activity:   . Days of Exercise per Week:   .  Minutes of Exercise per Session:   Stress:   . Feeling of Stress :   Social Connections:   . Frequency of Communication with Friends and Family:   . Frequency of Social Gatherings with Friends and Family:   . Attends Religious Services:   . Active Member of Clubs or Organizations:   . Attends Archivist Meetings:   Marland Kitchen Marital Status:   Intimate Partner Violence:   . Fear of Current or Ex-Partner:   . Emotionally Abused:   Marland Kitchen Physically Abused:   . Sexually Abused:     Outpatient Medications Prior to Visit  Medication Sig Dispense Refill  . albuterol (VENTOLIN HFA) 108 (90 Base) MCG/ACT inhaler Inhale into the lungs.    . ALPRAZolam (XANAX) 0.25 MG tablet TAKE 1 TABLET(0.25 MG) BY MOUTH THREE TIMES DAILY 90 tablet 1  . aspirin EC 325 MG tablet Take 325 mg by mouth daily.    Marland Kitchen atorvastatin (LIPITOR) 20 MG tablet Take 1 tablet (20 mg total) by mouth daily. 90 tablet 3  . celecoxib (CELEBREX) 200 MG capsule Take 200 mg by mouth daily.  0  . Cetirizine HCl 10  MG CAPS Take by mouth.    . chlorthalidone (HYGROTON) 25 MG tablet TAKE 1 TABLET(25 MG) BY MOUTH EVERY DAY 90 tablet 4  . diclofenac Sodium (VOLTAREN) 1 % GEL diclofenac 1 % topical gel    . dicyclomine (BENTYL) 20 MG tablet dicyclomine 20 mg tablet  TK 1 T PO BID PRN    . Erenumab-aooe (AIMOVIG) 140 MG/ML SOAJ Aimovig Autoinjector 140 mg/mL subcutaneous auto-injector  Inject by subcutaneous route.    Marland Kitchen FLUoxetine (PROZAC) 20 MG capsule TAKE 2 CAPSULES BY MOUTH EVERY DAY 180 capsule 0  . ondansetron (ZOFRAN) 4 MG tablet Take 1 tablet (4 mg total) by mouth every 8 (eight) hours as needed for nausea or vomiting. 90 tablet 0  . potassium chloride (MICRO-K) 10 MEQ CR capsule TAKE 1 CAPSULE(10 MEQ) BY MOUTH 1 TIME DAILY WITH FOOD 60 capsule 0  . potassium chloride SA (KLOR-CON) 20 MEQ tablet Take 20 mEq by mouth daily.    . promethazine (PHENERGAN) 25 MG tablet TAKE 1 TABLET BY MOUTH EVERY 8 HOURS IF NEEDED (Patient not taking: Reported on 02/27/2019) 20 tablet 0  . rizatriptan (MAXALT) 10 MG tablet Take 1 tablet (10 mg total) by mouth as needed for migraine. May repeat in 2 hours if needed 10 tablet 0  . tiZANidine (ZANAFLEX) 4 MG tablet tizanidine 4 mg tablet     No facility-administered medications prior to visit.    Allergies  Allergen Reactions  . Erythromycin Base Diarrhea  . Penicillins     Review of Systems  Constitutional: Negative for chills, fatigue and fever.  HENT: Positive for congestion, postnasal drip (worse at night), rhinorrhea, sinus pressure and sinus pain. Negative for ear pain and sore throat.   Respiratory: Positive for cough (early in the a.m. (clear)). Negative for shortness of breath.   Cardiovascular: Negative for chest pain.  Genitourinary: Negative for dysuria and urgency.  Musculoskeletal: Positive for arthralgias, back pain and myalgias.  Neurological: Positive for dizziness (vertigo) and headaches.  Psychiatric/Behavioral: Positive for dysphoric mood. The  patient is nervous/anxious.        Objective:    Physical Exam Vitals reviewed.  Constitutional:      Appearance: Normal appearance. She is normal weight.  HENT:     Right Ear: Tympanic membrane and ear canal normal.     Left  Ear: Tympanic membrane and ear canal normal.     Nose: Congestion and rhinorrhea present.     Comments: Pale mucosa. Sinus tenderness BL.     Mouth/Throat:     Mouth: Mucous membranes are moist.     Pharynx: Posterior oropharyngeal erythema (cobble stoning.) present. No oropharyngeal exudate.  Cardiovascular:     Rate and Rhythm: Normal rate and regular rhythm.     Heart sounds: Normal heart sounds.  Pulmonary:     Effort: Pulmonary effort is normal. No respiratory distress.     Breath sounds: Normal breath sounds.  Neurological:     Mental Status: She is alert.  Psychiatric:        Mood and Affect: Mood normal.        Behavior: Behavior normal.     BP 118/62   Pulse 95   Temp (!) 97.3 F (36.3 C)   Ht 5' 1.5" (1.562 m)   Wt 131 lb (59.4 kg)   SpO2 98%   BMI 24.35 kg/m  Wt Readings from Last 3 Encounters:  05/12/19 131 lb (59.4 kg)  02/27/19 129 lb (58.5 kg)  07/23/17 128 lb 6.4 oz (58.2 kg)    Health Maintenance Due  Topic Date Due  . COVID-19 Vaccine (1) Never done  . MAMMOGRAM  11/02/2018    There are no preventive care reminders to display for this patient.   Lab Results  Component Value Date   TSH 1.58 07/09/2016   Lab Results  Component Value Date   WBC 8.5 02/27/2019   HGB 13.7 02/27/2019   HCT 39.4 02/27/2019   MCV 89 02/27/2019   PLT 362 02/27/2019   Lab Results  Component Value Date   NA 134 02/27/2019   K 4.0 02/27/2019   CO2 32 07/09/2016   GLUCOSE 95 02/27/2019   BUN 11 02/27/2019   CREATININE 0.76 02/27/2019   BILITOT 0.4 02/27/2019   ALKPHOS 66 02/27/2019   AST 18 02/27/2019   ALT 14 07/09/2016   PROT 6.9 02/27/2019   ALBUMIN 4.6 02/27/2019   CALCIUM 10.2 02/27/2019   GFR 75.48 07/09/2016   Lab  Results  Component Value Date   CHOL 125 02/27/2019   Lab Results  Component Value Date   HDL 42 02/27/2019   Lab Results  Component Value Date   LDLCALC 58 02/27/2019   Lab Results  Component Value Date   TRIG 144 02/27/2019   Lab Results  Component Value Date   CHOLHDL 3.0 02/27/2019   Lab Results  Component Value Date   HGBA1C 6.5 (H) 06/18/2006       Assessment & Plan:  1. Acute non-recurrent sinusitis of other sinus - triamcinolone acetonide (KENALOG-40) injection 80 mg - doxycycline prescription sent. 2. Seasonal allergic rhinitis due to pollen - triamcinolone acetonide (KENALOG-40) injection 80 mg  - Continue zyrtec and nasocort.  Meds ordered this encounter  Medications  . doxycycline (VIBRA-TABS) 100 MG tablet    Sig: Take 1 tablet (100 mg total) by mouth 2 (two) times daily.    Dispense:  20 tablet    Refill:  0  . triamcinolone acetonide (KENALOG-40) injection 80 mg    No orders of the defined types were placed in this encounter.    Follow-up: No follow-ups on file.  An After Visit Summary was printed and given to the patient.  Rochel Brome [facility] [phone]

## 2019-05-12 NOTE — Patient Instructions (Signed)
1. Acute non-recurrent sinusitis of other sinus - triamcinolone acetonide (KENALOG-40) injection 80 mg - doxycycline prescription sent. 2. Seasonal allergic rhinitis due to pollen - triamcinolone acetonide (KENALOG-40) injection 80 mg  - Continue zyrtec and nasocort.  3. HAVE A GREAT TRIP AND BE CAREFUL!

## 2019-05-28 ENCOUNTER — Ambulatory Visit: Payer: Medicare PPO | Admitting: Family Medicine

## 2019-06-04 ENCOUNTER — Ambulatory Visit: Payer: Medicare PPO | Admitting: Family Medicine

## 2019-06-05 DIAGNOSIS — M546 Pain in thoracic spine: Secondary | ICD-10-CM | POA: Diagnosis not present

## 2019-06-05 DIAGNOSIS — M5136 Other intervertebral disc degeneration, lumbar region: Secondary | ICD-10-CM | POA: Diagnosis not present

## 2019-06-10 ENCOUNTER — Other Ambulatory Visit: Payer: Self-pay | Admitting: Family Medicine

## 2019-06-11 ENCOUNTER — Other Ambulatory Visit: Payer: Self-pay | Admitting: Family Medicine

## 2019-06-15 ENCOUNTER — Other Ambulatory Visit: Payer: Self-pay

## 2019-06-15 ENCOUNTER — Ambulatory Visit: Payer: Medicare PPO | Admitting: Family Medicine

## 2019-06-15 VITALS — BP 116/82 | HR 86 | Temp 97.5°F | Ht 61.5 in | Wt 128.0 lb

## 2019-06-15 DIAGNOSIS — F17219 Nicotine dependence, cigarettes, with unspecified nicotine-induced disorders: Secondary | ICD-10-CM | POA: Diagnosis not present

## 2019-06-15 DIAGNOSIS — E785 Hyperlipidemia, unspecified: Secondary | ICD-10-CM | POA: Diagnosis not present

## 2019-06-15 DIAGNOSIS — F330 Major depressive disorder, recurrent, mild: Secondary | ICD-10-CM

## 2019-06-15 DIAGNOSIS — I10 Essential (primary) hypertension: Secondary | ICD-10-CM

## 2019-06-15 DIAGNOSIS — J302 Other seasonal allergic rhinitis: Secondary | ICD-10-CM | POA: Diagnosis not present

## 2019-06-15 MED ORDER — MONTELUKAST SODIUM 10 MG PO TABS: 10.0000 mg | ORAL_TABLET | Freq: Every day | ORAL | 2 refills | Status: DC

## 2019-06-15 MED ORDER — DICLOFENAC SODIUM 1 % EX GEL: CUTANEOUS | 0 refills | Status: DC

## 2019-06-15 NOTE — Progress Notes (Signed)
Subjective:  Patient ID: Allison Jimenez, female    DOB: March 07, 1950  Age: 69 y.o. MRN: [id number]  Chief Complaint  Patient presents with  . Hyperlipidemia  . Hypertension  . Anxiety  . Depression  . Constipation    HPI Patient is a 69 year old white female who presents for follow-up of hyperlipidemia, hypertension and constipation.  She is eating healthy however not exercising.    Hyperlipidemia: Her current cholesterol medicines are atorvastatin 20 mg daily.    Hypertension:  well controlled on chlorthalidone 25 mg once daily.  Her constipation is well controlled on Linzess 145 mg 1 daily and occasionally takes MiraLAX.  Chronic migraines:  She sees neurology.  She says these are fairly well controlled on Aimovig and Maxalt.    Depression and anxiety: Well controlled on fluoxetine 20 mg 2 daily and Xanax 0.25 mg 1 pill 3 times a day as needed. Denies depression, anhedonia, anxiety, change in appetite, change in sleep patterns, or suicidal thoughts.   Tobacco Abuse: She has postnasal drainage and rhinorrhea which are her main complaints today.  She smokes 1/2 ppd. No longer vaping. Denies dypsnea.    Social History   Socioeconomic History  . Marital status: Divorced    Spouse name: Not on file  . Number of children: 1  . Years of education: 43  . Highest education level: Not on file  Occupational History    Employer: [employer]    Comment: Retired  Tobacco Use  . Smoking status: Current Every Day Smoker    Packs/day: 1.50    Years: 43.00    Pack years: 64.50    Types: Cigarettes  . Smokeless tobacco: Never Used  . Tobacco comment: using electronic cigs.-12 cigs a day as well  Substance and Sexual Activity  . Alcohol use: No    Alcohol/week: 0.0 standard drinks  . Drug use: No  . Sexual activity: Not on file  Other Topics Concern  . Not on file  Social History Narrative   Patient lives at home alone. Patient has a Data processing manager. Patient is  divorced .   Patient is retired.   Education high school.   Right handed.   Caffeine none.   Social Determinants of Health   Financial Resource Strain:   . Difficulty of Paying Living Expenses:   Food Insecurity:   . Worried About Charity fundraiser in the Last Year:   . Arboriculturist in the Last Year:   Transportation Needs:   . Film/video editor (Medical):   Marland Kitchen Lack of Transportation (Non-Medical):   Physical Activity:   . Days of Exercise per Week:   . Minutes of Exercise per Session:   Stress:   . Feeling of Stress :   Social Connections:   . Frequency of Communication with Friends and Family:   . Frequency of Social Gatherings with Friends and Family:   . Attends Religious Services:   . Active Member of Clubs or Organizations:   . Attends Archivist Meetings:   Marland Kitchen Marital Status:    Past Medical History:  Diagnosis Date  . Allergy   . ANXIETY 06/05/2006  . Arthritis    SHOULDERS   . BACK PAIN 01/05/2010  . Cataract    bilateral, left worse  . Centrilobular emphysema ([place])   . COPD (chronic obstructive pulmonary disease) ([place])   . DIVERTICULOSIS, COLON 06/05/2006  . DIZZINESS OR VERTIGO 06/05/2006   positional  . Hearing  loss in left ear   . HYPERLIPIDEMIA 06/05/2006  . HYPOKALEMIA 12/14/2008  . MENIERE'S DISEASE 06/25/2006  . Migraine   . TOBACCO USER 11/25/2007   Family History  Problem Relation Age of Onset  . Breast cancer Mother   . Migraines Other   . Depression Other   . Anxiety disorder Other   . High Cholesterol Other   . Colon cancer Neg Hx   . Rectal cancer Neg Hx   . Stomach cancer Neg Hx   . Colon polyps Neg Hx   . Esophageal cancer Neg Hx     Review of Systems  Constitutional: Negative for chills, fatigue and fever.  HENT: Positive for postnasal drip. Negative for congestion, ear pain and sore throat.   Respiratory: Positive for cough. Negative for shortness of breath.   Cardiovascular: Negative for chest pain.   Gastrointestinal: Negative for abdominal pain, constipation, diarrhea, nausea and vomiting.  Endocrine: Negative for polydipsia, polyphagia and polyuria.  Genitourinary: Negative for dysuria and urgency.  Musculoskeletal: Positive for back pain and myalgias (Back). Negative for arthralgias.  Neurological: Positive for headaches. Negative for dizziness.       Sees neurology.   Psychiatric/Behavioral: Negative for dysphoric mood. The patient is not nervous/anxious.      Objective:  BP 116/82   Pulse 86   Temp (!) 97.5 F (36.4 C)   Ht 5' 1.5" (1.562 m)   Wt 128 lb (58.1 kg)   SpO2 97%   BMI 23.79 kg/m   BP/Weight 06/15/2019 05/12/2019 0/93/2671  Systolic BP 245 809 983  Diastolic BP 82 62 68  Wt. (Lbs) 128 131 129  BMI 23.79 24.35 23.98    Physical Exam Vitals reviewed.  Constitutional:      Appearance: Normal appearance.  HENT:     Nose: Congestion (mild, erythema. Had kenalog shot 3 weeks ago, which helped only briefly. ) present.     Comments: swollen    Mouth/Throat:     Mouth: Mucous membranes are dry.     Pharynx: No oropharyngeal exudate or posterior oropharyngeal erythema.  Cardiovascular:     Rate and Rhythm: Normal rate and regular rhythm.  Pulmonary:     Effort: Pulmonary effort is normal.     Breath sounds: Normal breath sounds.  Abdominal:     General: Bowel sounds are normal.  Musculoskeletal:        General: Normal range of motion.     Cervical back: Normal range of motion.  Skin:    General: Skin is warm.  Neurological:     Mental Status: She is alert.  Psychiatric:        Mood and Affect: Mood normal.        Behavior: Behavior normal.     Lab Results  Component Value Date   WBC 8.5 02/27/2019   HGB 13.7 02/27/2019   HCT 39.4 02/27/2019   PLT 362 02/27/2019   GLUCOSE 95 02/27/2019   CHOL 125 02/27/2019   TRIG 144 02/27/2019   HDL 42 02/27/2019   LDLDIRECT 151.9 09/13/2011   LDLCALC 58 02/27/2019   ALT 14 07/09/2016   AST 18  02/27/2019   NA 134 02/27/2019   K 4.0 02/27/2019   CL 92 (L) 02/27/2019   CREATININE 0.76 02/27/2019   BUN 11 02/27/2019   CO2 32 07/09/2016   TSH 1.58 07/09/2016   HGBA1C 6.5 (H) 06/18/2006      Assessment & Plan:  1. Dyslipidemia Status: pending labs. Recommend continue to work  on eating healthy diet and exercise. - Lipid panel  2. Major depressive disorder, recurrent episode, mild (HCC) The current medical regimen is effective;  continue present plan and medications.  3. Essential hypertension Well controlled.  No changes to medicines.  Continue to work on eating a healthy diet and exercise.  Labs drawn today.  - CBC with Differential/Platelet - Comprehensive metabolic panel - TSH  4. Seasonal allergies Add singulair. May take antihistamine and steroid nasal spray with singulair if needed.  - montelukast (SINGULAIR) 10 MG tablet; Take 1 tablet (10 mg total) by mouth at bedtime.  Dispense: 30 tablet; Refill: 2  5. Cigarette nicotine dependence with nicotine-induced disorder No desire to quit smoking.   Follow-up: Return in about 3 months (around 09/15/2019).  AVS was given to patient prior to departure.  [facility] [phone]

## 2019-06-16 ENCOUNTER — Encounter: Payer: Self-pay | Admitting: Family Medicine

## 2019-06-16 DIAGNOSIS — J302 Other seasonal allergic rhinitis: Secondary | ICD-10-CM | POA: Insufficient documentation

## 2019-06-16 LAB — CBC WITH DIFFERENTIAL/PLATELET
Basophils Absolute: 0.1 10*3/uL (ref 0.0–0.2)
Basos: 1 %
EOS (ABSOLUTE): 0.3 10*3/uL (ref 0.0–0.4)
Eos: 3 %
Hematocrit: 35.8 % (ref 34.0–46.6)
Hemoglobin: 11.7 g/dL (ref 11.1–15.9)
Immature Grans (Abs): 0 10*3/uL (ref 0.0–0.1)
Immature Granulocytes: 0 %
Lymphocytes Absolute: 2.8 10*3/uL (ref 0.7–3.1)
Lymphs: 33 %
MCH: 29.9 pg (ref 26.6–33.0)
MCHC: 32.7 g/dL (ref 31.5–35.7)
MCV: 92 fL (ref 79–97)
Monocytes Absolute: 0.7 10*3/uL (ref 0.1–0.9)
Monocytes: 9 %
Neutrophils Absolute: 4.7 10*3/uL (ref 1.4–7.0)
Neutrophils: 54 %
Platelets: 357 10*3/uL (ref 150–450)
RBC: 3.91 x10E6/uL (ref 3.77–5.28)
RDW: 12.6 % (ref 11.7–15.4)
WBC: 8.6 10*3/uL (ref 3.4–10.8)

## 2019-06-16 LAB — COMPREHENSIVE METABOLIC PANEL
ALT: 24 IU/L (ref 0–32)
AST: 19 IU/L (ref 0–40)
Albumin/Globulin Ratio: 2 (ref 1.2–2.2)
Albumin: 4.5 g/dL (ref 3.8–4.8)
Alkaline Phosphatase: 56 IU/L (ref 48–121)
BUN/Creatinine Ratio: 19 (ref 12–28)
BUN: 14 mg/dL (ref 8–27)
Bilirubin Total: 0.2 mg/dL (ref 0.0–1.2)
CO2: 26 mmol/L (ref 20–29)
Calcium: 10 mg/dL (ref 8.7–10.3)
Chloride: 93 mmol/L — ABNORMAL LOW (ref 96–106)
Creatinine, Ser: 0.73 mg/dL (ref 0.57–1.00)
GFR calc Af Amer: 99 mL/min/{1.73_m2} (ref >59)
GFR calc non Af Amer: 85 mL/min/{1.73_m2} (ref >59)
Globulin, Total: 2.2 g/dL (ref 1.5–4.5)
Glucose: 90 mg/dL (ref 65–99)
Potassium: 3.9 mmol/L (ref 3.5–5.2)
Sodium: 131 mmol/L — ABNORMAL LOW (ref 134–144)
Total Protein: 6.7 g/dL (ref 6.0–8.5)

## 2019-06-16 LAB — LIPID PANEL
Chol/HDL Ratio: 2.6 ratio (ref 0.0–4.4)
Cholesterol, Total: 146 mg/dL (ref 100–199)
HDL: 56 mg/dL (ref >39)
LDL Chol Calc (NIH): 70 mg/dL (ref 0–99)
Triglycerides: 111 mg/dL (ref 0–149)
VLDL Cholesterol Cal: 20 mg/dL (ref 5–40)

## 2019-06-16 LAB — TSH: TSH: 2.47 u[IU]/mL (ref 0.450–4.500)

## 2019-06-16 LAB — CARDIOVASCULAR RISK ASSESSMENT

## 2019-07-28 ENCOUNTER — Other Ambulatory Visit: Payer: Self-pay | Admitting: Family Medicine

## 2019-07-28 NOTE — Telephone Encounter (Signed)
Please check how often potasssium is being taken. Kc

## 2019-08-11 ENCOUNTER — Other Ambulatory Visit: Payer: Self-pay | Admitting: Family Medicine

## 2019-08-18 ENCOUNTER — Other Ambulatory Visit: Payer: Self-pay | Admitting: Family Medicine

## 2019-08-18 MED ORDER — ATORVASTATIN CALCIUM 20 MG PO TABS: 20.0000 mg | ORAL_TABLET | Freq: Every day | ORAL | 1 refills | Status: DC

## 2019-09-08 ENCOUNTER — Other Ambulatory Visit: Payer: Self-pay | Admitting: Family Medicine

## 2019-09-14 ENCOUNTER — Other Ambulatory Visit: Payer: Self-pay | Admitting: Family Medicine

## 2019-09-16 ENCOUNTER — Ambulatory Visit: Payer: Medicare PPO | Admitting: Family Medicine

## 2019-09-25 DIAGNOSIS — H43811 Vitreous degeneration, right eye: Secondary | ICD-10-CM | POA: Diagnosis not present

## 2019-09-25 DIAGNOSIS — H04123 Dry eye syndrome of bilateral lacrimal glands: Secondary | ICD-10-CM | POA: Diagnosis not present

## 2019-10-15 ENCOUNTER — Encounter: Payer: Self-pay | Admitting: Family Medicine

## 2019-10-15 ENCOUNTER — Other Ambulatory Visit: Payer: Self-pay

## 2019-10-15 ENCOUNTER — Ambulatory Visit: Payer: Medicare PPO | Admitting: Family Medicine

## 2019-10-15 VITALS — BP 118/80 | HR 77 | Temp 97.8°F | Ht 62.0 in | Wt 129.8 lb

## 2019-10-15 DIAGNOSIS — L304 Erythema intertrigo: Secondary | ICD-10-CM | POA: Diagnosis not present

## 2019-10-15 DIAGNOSIS — E785 Hyperlipidemia, unspecified: Secondary | ICD-10-CM | POA: Diagnosis not present

## 2019-10-15 DIAGNOSIS — I10 Essential (primary) hypertension: Secondary | ICD-10-CM | POA: Diagnosis not present

## 2019-10-15 DIAGNOSIS — Z23 Encounter for immunization: Secondary | ICD-10-CM | POA: Diagnosis not present

## 2019-10-15 DIAGNOSIS — F17219 Nicotine dependence, cigarettes, with unspecified nicotine-induced disorders: Secondary | ICD-10-CM | POA: Diagnosis not present

## 2019-10-15 DIAGNOSIS — G43009 Migraine without aura, not intractable, without status migrainosus: Secondary | ICD-10-CM

## 2019-10-15 DIAGNOSIS — J018 Other acute sinusitis: Secondary | ICD-10-CM

## 2019-10-15 DIAGNOSIS — F330 Major depressive disorder, recurrent, mild: Secondary | ICD-10-CM

## 2019-10-15 MED ORDER — DOXYCYCLINE HYCLATE 100 MG PO TABS: 100.0000 mg | ORAL_TABLET | Freq: Two times a day (BID) | ORAL | 0 refills | Status: DC

## 2019-10-15 MED ORDER — FLUCONAZOLE 100 MG PO TABS: 100.0000 mg | ORAL_TABLET | Freq: Every day | ORAL | 0 refills | Status: DC

## 2019-10-15 NOTE — Patient Instructions (Signed)
Rash under breasts: Diflucan prescribed. Sinusitis: Doxycycline given. Labs drawn. COVID-19 booster given. Fluad given.

## 2019-10-15 NOTE — Progress Notes (Signed)
° °  Covid-19 Vaccination Clinic  Name:  Allison Jimenez    MRN: [id number] DOB: 02-Feb-1950  10/15/2019  [name] was observed post Covid-19 immunization for 15 minutes without incident. She was provided with Vaccine Information Sheet and instruction to access the V-Safe system.   [name] was instructed to call 911 with any severe reactions post vaccine:  Difficulty breathing   Swelling of face and throat   A fast heartbeat   A bad rash all over body   Dizziness and weakness

## 2019-10-15 NOTE — Progress Notes (Signed)
Subjective:  Patient ID: Allison Jimenez, female    DOB: August 14, 1950  Age: 69 y.o. MRN: [id number]  Chief Complaint  Patient presents with  . Dyslipidemia    HPI Patient is a 69 year old white female who presents for follow-up of hyperlipidemia, hypertension and constipation.  She is eating healthy however not exercising.    Hyperlipidemia: Her current cholesterol medicines are atorvastatin 20 mg daily.    Hypertension:  well controlled on chlorthalidone 25 mg once daily.    Her constipation is well controlled on Linzess 145 mg 1 daily and occasionally takes MiraLAX.  Chronic migraines:  She sees neurology.  She says these are fairly well controlled on Aimovig and Maxalt.    Depression and anxiety: Well controlled on fluoxetine 20 mg 2 daily and Xanax 0.25 mg 1 pill 3 times a day as needed. Denies depression, anhedonia, anxiety, change in appetite, change in sleep patterns, or suicidal thoughts.   Tobacco Abuse: She has postnasal drainage and rhinorrhea which are her main complaints today which have continued despite changes to her allergy medications.  She has not been treated for sinus infection in the last 6 months..  She smokes 1/2 ppd. No longer vaping. Denies dypsnea.  Patient has no desire to quit.  Patient had a flareup of her Mnire's disease while she was in [place] approximately 1 week ago.  At this time she doubles her chlorthalidone which seems to help.  She also takes Phenergan for nausea.  And then she goes to sleep.   Current Outpatient Medications on File Prior to Visit  Medication Sig Dispense Refill  . albuterol (VENTOLIN HFA) 108 (90 Base) MCG/ACT inhaler Inhale into the lungs.    . ALPRAZolam (XANAX) 0.25 MG tablet TAKE 1 TABLET(0.25 MG) BY MOUTH THREE TIMES DAILY 90 tablet 1  . aspirin EC 325 MG tablet Take 325 mg by mouth daily.    Marland Kitchen atorvastatin (LIPITOR) 20 MG tablet Take 1 tablet (20 mg total) by mouth daily. 90 tablet 1  . celecoxib (CELEBREX) 100 MG  capsule TAKE 1 CAPSULE BY MOUTH TWICE DAILY 180 capsule 0  . chlorthalidone (HYGROTON) 25 MG tablet TAKE 1 TABLET(25 MG) BY MOUTH EVERY DAY 90 tablet 4  . diclofenac Sodium (VOLTAREN) 1 % GEL diclofenac 1 % topical gel 100 g 0  . dicyclomine (BENTYL) 20 MG tablet dicyclomine 20 mg tablet  TK 1 T PO BID PRN    . Erenumab-aooe (AIMOVIG) 140 MG/ML SOAJ Aimovig Autoinjector 140 mg/mL subcutaneous auto-injector  Inject by subcutaneous route.    Marland Kitchen FLUoxetine (PROZAC) 20 MG capsule TAKE 2 CAPSULES BY MOUTH EVERY DAY 180 capsule 0  . montelukast (SINGULAIR) 10 MG tablet Take 1 tablet (10 mg total) by mouth at bedtime. 30 tablet 2  . ondansetron (ZOFRAN) 4 MG tablet Take 1 tablet (4 mg total) by mouth every 8 (eight) hours as needed for nausea or vomiting. 90 tablet 0  . potassium chloride (MICRO-K) 10 MEQ CR capsule TAKE 1 CAPSULE(10 MEQ) BY MOUTH 1 TIME DAILY WITH FOOD 90 capsule 0  . promethazine (PHENERGAN) 25 MG tablet TAKE 1 TABLET BY MOUTH EVERY 8 HOURS IF NEEDED 20 tablet 0  . rizatriptan (MAXALT) 10 MG tablet Take 1 tablet (10 mg total) by mouth as needed for migraine. May repeat in 2 hours if needed 10 tablet 0  . tiZANidine (ZANAFLEX) 4 MG tablet tizanidine 4 mg tablet     No current facility-administered medications on file prior to visit.   Past  Medical History:  Diagnosis Date  . Allergy   . ANXIETY 06/05/2006  . Arthritis    SHOULDERS   . BACK PAIN 01/05/2010  . Cataract    bilateral, left worse  . Centrilobular emphysema ([place])   . COPD (chronic obstructive pulmonary disease) ([place])   . DIVERTICULOSIS, COLON 06/05/2006  . DIZZINESS OR VERTIGO 06/05/2006   positional  . Hearing loss in left ear   . HYPERLIPIDEMIA 06/05/2006  . HYPOKALEMIA 12/14/2008  . MENIERE'S DISEASE 06/25/2006  . Migraine   . TOBACCO USER 11/25/2007   Past Surgical History:  Procedure Laterality Date  . APPENDECTOMY    . CESAREAN SECTION     x1  . CHOLECYSTECTOMY    . COLONOSCOPY    . COSMETIC SURGERY    .  mastoid surgery  1992   shunt in mastoid- endolymphatic sac in left ear   . TUBAL LIGATION    . vocal cord surgery     nodule x2    Family History  Problem Relation Age of Onset  . Breast cancer Mother   . Migraines Other   . Depression Other   . Anxiety disorder Other   . High Cholesterol Other   . Colon cancer Neg Hx   . Rectal cancer Neg Hx   . Stomach cancer Neg Hx   . Colon polyps Neg Hx   . Esophageal cancer Neg Hx    Social History   Socioeconomic History  . Marital status: Divorced    Spouse name: Not on file  . Number of children: 1  . Years of education: 28  . Highest education level: Not on file  Occupational History    Employer: [employer]    Comment: Retired  Tobacco Use  . Smoking status: Current Every Day Smoker    Packs/day: 1.50    Years: 43.00    Pack years: 64.50    Types: Cigarettes  . Smokeless tobacco: Never Used  . Tobacco comment: using electronic cigs.-12 cigs a day as well  Substance and Sexual Activity  . Alcohol use: No    Alcohol/week: 0.0 standard drinks  . Drug use: No  . Sexual activity: Not on file  Other Topics Concern  . Not on file  Social History Narrative   Patient lives at home alone. Patient has a Data processing manager. Patient is divorced .   Patient is retired.   Education high school.   Right handed.   Caffeine none.   Social Determinants of Health   Financial Resource Strain:   . Difficulty of Paying Living Expenses: Not on file  Food Insecurity:   . Worried About Charity fundraiser in the Last Year: Not on file  . Ran Out of Food in the Last Year: Not on file  Transportation Needs:   . Lack of Transportation (Medical): Not on file  . Lack of Transportation (Non-Medical): Not on file  Physical Activity:   . Days of Exercise per Week: Not on file  . Minutes of Exercise per Session: Not on file  Stress:   . Feeling of Stress : Not on file  Social Connections:   . Frequency of Communication  with Friends and Family: Not on file  . Frequency of Social Gatherings with Friends and Family: Not on file  . Attends Religious Services: Not on file  . Active Member of Clubs or Organizations: Not on file  . Attends Archivist Meetings: Not on file  . Marital Status:  Not on file    Review of Systems  Constitutional: Negative for chills, fatigue and fever.  HENT: Positive for congestion and sinus pain. Negative for ear pain and sore throat.   Respiratory: Positive for shortness of breath. Negative for cough.   Cardiovascular: Negative for chest pain.  Gastrointestinal: Negative for abdominal pain, constipation, diarrhea, nausea and vomiting.  Genitourinary: Negative for dysuria and urgency.  Musculoskeletal: Negative for arthralgias and myalgias.  Skin: Negative for rash.  Neurological: Negative for dizziness and headaches.  Psychiatric/Behavioral: Negative for dysphoric mood. The patient is not nervous/anxious.      Objective:  BP 118/80 (BP Location: Right Arm, Patient Position: Sitting, Cuff Size: Normal)   Pulse 77   Temp 97.8 F (36.6 C) (Temporal)   Ht 5\' 2"  (1.575 m)   Wt 129 lb 12.8 oz (58.9 kg)   SpO2 99%   BMI 23.74 kg/m   BP/Weight 10/15/2019 06/15/2019 6/78/9381  Systolic BP 017 510 258  Diastolic BP 80 82 62  Wt. (Lbs) 129.8 128 131  BMI 23.74 23.79 24.35    Physical Exam Vitals reviewed.  Constitutional:      Appearance: Normal appearance. She is normal weight.  HENT:     Right Ear: Tympanic membrane, ear canal and external ear normal.     Left Ear: Tympanic membrane, ear canal and external ear normal.     Nose: Congestion (Erythema) present.     Mouth/Throat:     Pharynx: Oropharynx is clear.  Neck:     Vascular: No carotid bruit.  Cardiovascular:     Rate and Rhythm: Normal rate and regular rhythm.     Pulses: Normal pulses.     Heart sounds: Normal heart sounds. No murmur heard.   Pulmonary:     Effort: Pulmonary effort is normal.  No respiratory distress (zole).     Breath sounds: Normal breath sounds.  Abdominal:     General: Abdomen is flat. Bowel sounds are normal.     Palpations: Abdomen is soft.     Tenderness: There is no abdominal tenderness.  Skin:    Findings: Rash (Rash under both breasts.  Mild.) present.  Neurological:     Mental Status: She is alert and oriented to person, place, and time.  Psychiatric:        Mood and Affect: Mood normal.        Behavior: Behavior normal.     Diabetic Foot Exam - Simple   No data filed       Lab Results  Component Value Date   WBC 8.6 06/15/2019   HGB 11.7 06/15/2019   HCT 35.8 06/15/2019   PLT 357 06/15/2019   GLUCOSE 90 06/15/2019   CHOL 146 06/15/2019   TRIG 111 06/15/2019   HDL 56 06/15/2019   LDLDIRECT 151.9 09/13/2011   LDLCALC 70 06/15/2019   ALT 24 06/15/2019   AST 19 06/15/2019   NA 131 (L) 06/15/2019   K 3.9 06/15/2019   CL 93 (L) 06/15/2019   CREATININE 0.73 06/15/2019   BUN 14 06/15/2019   CO2 26 06/15/2019   TSH 2.470 06/15/2019   HGBA1C 6.5 (H) 06/18/2006      Assessment & Plan:   1. Intertrigo - fluconazole (DIFLUCAN) 100 MG tablet; Take 1 tablet (100 mg total) by mouth daily.  Dispense: 7 tablet; Refill: 0  2. Acute non-recurrent sinusitis of other sinus - doxycycline (VIBRA-TABS) 100 MG tablet; Take 1 tablet (100 mg total) by mouth 2 (two) times daily.  Dispense: 20 tablet; Refill: 0  3. Need for vaccination Fluad given.  4. Dyslipidemia Well controlled.  No changes to medicines.  Continue to work on eating a healthy diet and exercise.  Labs drawn today.  - Lipid panel  5. Essential hypertension - CBC with Differential/Platelet - Comprehensive metabolic panel  6. Major depressive disorder, recurrent episode, mild (HCC) The current medical regimen is effective;  continue present plan and medications.  7. Cigarette nicotine dependence with nicotine-induced disorder Recommend cessation  8. Migraine without  aura and without status migrainosus, not intractable  The current medical regimen is effective;  continue present plan and medications.  Meds ordered this encounter  Medications  . fluconazole (DIFLUCAN) 100 MG tablet    Sig: Take 1 tablet (100 mg total) by mouth daily.    Dispense:  7 tablet    Refill:  0  . doxycycline (VIBRA-TABS) 100 MG tablet    Sig: Take 1 tablet (100 mg total) by mouth 2 (two) times daily.    Dispense:  20 tablet    Refill:  0    Orders Placed This Encounter  Procedures  . CBC with Differential/Platelet  . Comprehensive metabolic panel  . Lipid panel     Follow-up: Return in about 4 months (around 02/15/2020) for fasting.  An After Visit Summary was printed and given to the patient.  Rochel Brome [facility] [phone]

## 2019-10-16 LAB — CBC WITH DIFFERENTIAL/PLATELET
Basophils Absolute: 0.1 10*3/uL (ref 0.0–0.2)
Basos: 1 %
EOS (ABSOLUTE): 0.5 10*3/uL — ABNORMAL HIGH (ref 0.0–0.4)
Eos: 7 %
Hematocrit: 37.4 % (ref 34.0–46.6)
Hemoglobin: 12.3 g/dL (ref 11.1–15.9)
Immature Grans (Abs): 0 10*3/uL (ref 0.0–0.1)
Immature Granulocytes: 0 %
Lymphocytes Absolute: 2.3 10*3/uL (ref 0.7–3.1)
Lymphs: 29 %
MCH: 28.7 pg (ref 26.6–33.0)
MCHC: 32.9 g/dL (ref 31.5–35.7)
MCV: 87 fL (ref 79–97)
Monocytes Absolute: 0.7 10*3/uL (ref 0.1–0.9)
Monocytes: 9 %
Neutrophils Absolute: 4.3 10*3/uL (ref 1.4–7.0)
Neutrophils: 54 %
Platelets: 344 10*3/uL (ref 150–450)
RBC: 4.29 x10E6/uL (ref 3.77–5.28)
RDW: 15.3 % (ref 11.7–15.4)
WBC: 8 10*3/uL (ref 3.4–10.8)

## 2019-10-16 LAB — COMPREHENSIVE METABOLIC PANEL
ALT: 17 IU/L (ref 0–32)
AST: 18 IU/L (ref 0–40)
Albumin/Globulin Ratio: 2 (ref 1.2–2.2)
Albumin: 4.3 g/dL (ref 3.8–4.8)
Alkaline Phosphatase: 61 IU/L (ref 44–121)
BUN/Creatinine Ratio: 15 (ref 12–28)
BUN: 10 mg/dL (ref 8–27)
Bilirubin Total: 0.4 mg/dL (ref 0.0–1.2)
CO2: 26 mmol/L (ref 20–29)
Calcium: 9.7 mg/dL (ref 8.7–10.3)
Chloride: 94 mmol/L — ABNORMAL LOW (ref 96–106)
Creatinine, Ser: 0.66 mg/dL (ref 0.57–1.00)
GFR calc Af Amer: 105 mL/min/{1.73_m2} (ref >59)
GFR calc non Af Amer: 91 mL/min/{1.73_m2} (ref >59)
Globulin, Total: 2.1 g/dL (ref 1.5–4.5)
Glucose: 99 mg/dL (ref 65–99)
Potassium: 3.9 mmol/L (ref 3.5–5.2)
Sodium: 132 mmol/L — ABNORMAL LOW (ref 134–144)
Total Protein: 6.4 g/dL (ref 6.0–8.5)

## 2019-10-16 LAB — LIPID PANEL
Chol/HDL Ratio: 3.2 ratio (ref 0.0–4.4)
Cholesterol, Total: 141 mg/dL (ref 100–199)
HDL: 44 mg/dL (ref >39)
LDL Chol Calc (NIH): 73 mg/dL (ref 0–99)
Triglycerides: 134 mg/dL (ref 0–149)
VLDL Cholesterol Cal: 24 mg/dL (ref 5–40)

## 2019-10-16 LAB — CARDIOVASCULAR RISK ASSESSMENT

## 2019-10-24 ENCOUNTER — Other Ambulatory Visit: Payer: Self-pay | Admitting: Family Medicine

## 2019-11-10 ENCOUNTER — Ambulatory Visit: Payer: Medicare PPO | Admitting: Family Medicine

## 2019-11-10 ENCOUNTER — Other Ambulatory Visit: Payer: Self-pay

## 2019-11-10 VITALS — BP 116/64 | HR 84 | Temp 97.3°F | Resp 14 | Ht 60.5 in | Wt 128.0 lb

## 2019-11-10 DIAGNOSIS — H6123 Impacted cerumen, bilateral: Secondary | ICD-10-CM | POA: Diagnosis not present

## 2019-11-10 DIAGNOSIS — F5101 Primary insomnia: Secondary | ICD-10-CM

## 2019-11-10 DIAGNOSIS — R011 Cardiac murmur, unspecified: Secondary | ICD-10-CM

## 2019-11-10 DIAGNOSIS — H8103 Meniere's disease, bilateral: Secondary | ICD-10-CM

## 2019-11-10 DIAGNOSIS — H93A3 Pulsatile tinnitus, bilateral: Secondary | ICD-10-CM | POA: Diagnosis not present

## 2019-11-10 LAB — EKG 12-LEAD

## 2019-11-10 MED ORDER — ALPRAZOLAM 0.25 MG PO TABS
ORAL_TABLET | ORAL | 1 refills | Status: DC
Start: 2019-11-10 — End: 2020-01-11

## 2019-11-10 MED ORDER — TRAZODONE HCL 50 MG PO TABS: 50.0000 mg | ORAL_TABLET | Freq: Every evening | ORAL | 3 refills | Status: DC | PRN

## 2019-11-10 NOTE — Patient Instructions (Signed)
Echo ordered.  Carotid US.  Start trazodone 50 mg one at night.

## 2019-11-10 NOTE — Progress Notes (Signed)
Acute Office Visit  Subjective:    Patient ID: Allison Jimenez, female    DOB: August 27, 1950, 69 y.o.   MRN: [id number]  Chief Complaint  Patient presents with  . nausea and vomiting  . Anxiety  . pounding left ear    HPI  Two weeks ago had migraine/meniere's flare and resolved in 2 days and then on 10/30-10/31 felt bloated after eating (no nausea/vomiting or diarrhea), then had a panic attack on 11/02/2019 which include tingling all over, severe nausea, and diarrhea, and feels shaky. No associated sob, chest pain. Took xanax x 1 - 2 and it helped. Then on 11/2-11/3 improved. 11/4 nausea and diarrhea. Which sparked off another panic attack similar to the one on 11/1. Took one - two xanax which helped. Then she felt depressed on  11/6.  Last Friday began having muscle spasms along the left side of her entire back.  Took tizanidine. Saw chiropractor Monday 11/08/2019 which helped. Her neurologist changed her muscle relaxant from tizanidine to flexeril today. Also complaining of pulsative sound in her head...feels like her heart is beating in her head.  Panic attacks: may have 5 in a year.  For the last few days she has been hearing her heart beat in her head.   Past Medical History:  Diagnosis Date  . Allergy   . ANXIETY 06/05/2006  . Arthritis    SHOULDERS   . BACK PAIN 01/05/2010  . Cataract    bilateral, left worse  . Centrilobular emphysema ([place])   . COPD (chronic obstructive pulmonary disease) ([place])   . DIVERTICULOSIS, COLON 06/05/2006  . DIZZINESS OR VERTIGO 06/05/2006   positional  . Hearing loss in left ear   . HYPERLIPIDEMIA 06/05/2006  . HYPOKALEMIA 12/14/2008  . MENIERE'S DISEASE 06/25/2006  . Migraine   . TOBACCO USER 11/25/2007    Past Surgical History:  Procedure Laterality Date  . APPENDECTOMY    . CESAREAN SECTION     x1  . CHOLECYSTECTOMY    . COLONOSCOPY    . COSMETIC SURGERY    . mastoid surgery  1992   shunt in mastoid- endolymphatic sac in left ear   . TUBAL  LIGATION    . vocal cord surgery     nodule x2    Family History  Problem Relation Age of Onset  . Breast cancer Mother   . Migraines Other   . Depression Other   . Anxiety disorder Other   . High Cholesterol Other   . Colon cancer Neg Hx   . Rectal cancer Neg Hx   . Stomach cancer Neg Hx   . Colon polyps Neg Hx   . Esophageal cancer Neg Hx     Social History   Socioeconomic History  . Marital status: Divorced    Spouse name: Not on file  . Number of children: 1  . Years of education: 40  . Highest education level: Not on file  Occupational History    Employer: [employer]    Comment: Retired  Tobacco Use  . Smoking status: Current Every Day Smoker    Packs/day: 1.50    Years: 43.00    Pack years: 64.50    Types: Cigarettes  . Smokeless tobacco: Never Used  . Tobacco comment: using electronic cigs.-12 cigs a day as well  Substance and Sexual Activity  . Alcohol use: No    Alcohol/week: 0.0 standard drinks  . Drug use: No  . Sexual activity: Not on file  Other Topics Concern  . Not on file  Social History Narrative   Patient lives at home alone. Patient has a Data processing manager. Patient is divorced .   Patient is retired.   Education high school.   Right handed.   Caffeine none.   Social Determinants of Health   Financial Resource Strain:   . Difficulty of Paying Living Expenses: Not on file  Food Insecurity:   . Worried About Charity fundraiser in the Last Year: Not on file  . Ran Out of Food in the Last Year: Not on file  Transportation Needs:   . Lack of Transportation (Medical): Not on file  . Lack of Transportation (Non-Medical): Not on file  Physical Activity:   . Days of Exercise per Week: Not on file  . Minutes of Exercise per Session: Not on file  Stress:   . Feeling of Stress : Not on file  Social Connections:   . Frequency of Communication with Friends and Family: Not on file  . Frequency of Social Gatherings with  Friends and Family: Not on file  . Attends Religious Services: Not on file  . Active Member of Clubs or Organizations: Not on file  . Attends Archivist Meetings: Not on file  . Marital Status: Not on file  Intimate Partner Violence:   . Fear of Current or Ex-Partner: Not on file  . Emotionally Abused: Not on file  . Physically Abused: Not on file  . Sexually Abused: Not on file    Outpatient Medications Prior to Visit  Medication Sig Dispense Refill  . albuterol (VENTOLIN HFA) 108 (90 Base) MCG/ACT inhaler Inhale into the lungs.    Marland Kitchen aspirin EC 325 MG tablet Take 325 mg by mouth daily.    Marland Kitchen atorvastatin (LIPITOR) 20 MG tablet Take 1 tablet (20 mg total) by mouth daily. 90 tablet 1  . celecoxib (CELEBREX) 100 MG capsule TAKE 1 CAPSULE BY MOUTH TWICE DAILY 180 capsule 0  . chlorthalidone (HYGROTON) 25 MG tablet TAKE 1 TABLET(25 MG) BY MOUTH EVERY DAY 90 tablet 4  . cyclobenzaprine (FLEXERIL) 10 MG tablet Take by mouth.    . diclofenac Sodium (VOLTAREN) 1 % GEL diclofenac 1 % topical gel 100 g 0  . dicyclomine (BENTYL) 20 MG tablet dicyclomine 20 mg tablet  TK 1 T PO BID PRN    . Erenumab-aooe (AIMOVIG) 140 MG/ML SOAJ Aimovig Autoinjector 140 mg/mL subcutaneous auto-injector  Inject by subcutaneous route.    Marland Kitchen FLUoxetine (PROZAC) 20 MG capsule TAKE 2 CAPSULES BY MOUTH EVERY DAY 180 capsule 0  . montelukast (SINGULAIR) 10 MG tablet Take 1 tablet (10 mg total) by mouth at bedtime. 30 tablet 2  . ondansetron (ZOFRAN) 4 MG tablet Take 1 tablet (4 mg total) by mouth every 8 (eight) hours as needed for nausea or vomiting. 90 tablet 0  . potassium chloride (MICRO-K) 10 MEQ CR capsule TAKE 1 CAPSULE(10 MEQ) BY MOUTH EVERY DAY WITH FOOD 90 capsule 1  . promethazine (PHENERGAN) 25 MG tablet TAKE 1 TABLET BY MOUTH EVERY 8 HOURS IF NEEDED 20 tablet 0  . rizatriptan (MAXALT) 10 MG tablet Take 1 tablet (10 mg total) by mouth as needed for migraine. May repeat in 2 hours if needed 10 tablet 0   . ALPRAZolam (XANAX) 0.25 MG tablet TAKE 1 TABLET(0.25 MG) BY MOUTH THREE TIMES DAILY 90 tablet 1  . nystatin-triamcinolone (MYCOLOG II) cream nystatin-triamcinolone 100,000 unit/g-0.1 % topical cream    . tiZANidine (  ZANAFLEX) 4 MG tablet tizanidine 4 mg tablet    . doxycycline (VIBRA-TABS) 100 MG tablet Take 1 tablet (100 mg total) by mouth 2 (two) times daily. 20 tablet 0  . fluconazole (DIFLUCAN) 100 MG tablet Take 1 tablet (100 mg total) by mouth daily. 7 tablet 0   No facility-administered medications prior to visit.    Allergies  Allergen Reactions  . Erythromycin Base Diarrhea  . Penicillins     Review of Systems  Constitutional: Negative for chills, fatigue and fever.  HENT: Positive for ear pain (pulsation in left ear). Negative for congestion, rhinorrhea and sore throat.   Respiratory: Negative for cough and shortness of breath.   Cardiovascular: Negative for chest pain.  Gastrointestinal: Positive for nausea and vomiting. Negative for abdominal pain, constipation and diarrhea.  Genitourinary: Negative for dysuria and urgency.  Musculoskeletal: Negative for back pain and myalgias.  Neurological: Positive for dizziness and headaches. Negative for weakness and light-headedness.  Psychiatric/Behavioral: Positive for dysphoric mood. The patient is nervous/anxious.        Objective:    Physical Exam Vitals reviewed.  Constitutional:      Appearance: Normal appearance. She is normal weight.  HENT:     Right Ear: Tympanic membrane, ear canal and external ear normal. There is impacted cerumen.     Left Ear: Tympanic membrane, ear canal and external ear normal. There is impacted cerumen.     Nose: Nose normal.     Mouth/Throat:     Pharynx: Oropharynx is clear.  Neck:     Vascular: No carotid bruit.  Cardiovascular:     Rate and Rhythm: Normal rate and regular rhythm.     Pulses: Normal pulses.     Heart sounds: Murmur (2/6. ) heard.   Pulmonary:     Effort:  Pulmonary effort is normal. No respiratory distress.     Breath sounds: Normal breath sounds.  Neurological:     Mental Status: She is alert and oriented to person, place, and time.  Psychiatric:        Mood and Affect: Mood normal.        Behavior: Behavior normal.     BP 116/64   Pulse 84   Temp (!) 97.3 F (36.3 C)   Resp 14   Ht 5' 0.5" (1.537 m)   Wt 128 lb (58.1 kg)   BMI 24.59 kg/m  Wt Readings from Last 3 Encounters:  11/10/19 128 lb (58.1 kg)  10/15/19 129 lb 12.8 oz (58.9 kg)  06/15/19 128 lb (58.1 kg)    Health Maintenance Due  Topic Date Due  . MAMMOGRAM  11/02/2018    There are no preventive care reminders to display for this patient.   Lab Results  Component Value Date   TSH 2.470 06/15/2019   Lab Results  Component Value Date   WBC 8.0 10/15/2019   HGB 12.3 10/15/2019   HCT 37.4 10/15/2019   MCV 87 10/15/2019   PLT 344 10/15/2019   Lab Results  Component Value Date   NA 132 (L) 10/15/2019   K 3.9 10/15/2019   CO2 26 10/15/2019   GLUCOSE 99 10/15/2019   BUN 10 10/15/2019   CREATININE 0.66 10/15/2019   BILITOT 0.4 10/15/2019   ALKPHOS 61 10/15/2019   AST 18 10/15/2019   ALT 17 10/15/2019   PROT 6.4 10/15/2019   ALBUMIN 4.3 10/15/2019   CALCIUM 9.7 10/15/2019   GFR 75.48 07/09/2016   Lab Results  Component Value Date  CHOL 141 10/15/2019   Lab Results  Component Value Date   HDL 44 10/15/2019   Lab Results  Component Value Date   LDLCALC 73 10/15/2019   Lab Results  Component Value Date   TRIG 134 10/15/2019   Lab Results  Component Value Date   CHOLHDL 3.2 10/15/2019   Lab Results  Component Value Date   HGBA1C 6.5 (H) 06/18/2006       Assessment & Plan:  1. Bilateral impacted cerumen  Instrumentation used to clear wax. Tinnitus seemed to improve. Pt instructed to call back if returned, which she did.   2. Pulsatile tinnitus of both ears  Check carotid US BL and ECHO.  3. Murmur  Check echo.  4. Meniere's  disease of both ears  Keeps meclizine on hand.   Defer mgmt to ENT.   5. Primary insomnia   Start trazodone.   Meds ordered this encounter  Medications  . traZODone (DESYREL) 50 MG tablet    Sig: Take 1 tablet (50 mg total) by mouth at bedtime as needed for sleep.    Dispense:  30 tablet    Refill:  3  . ALPRAZolam (XANAX) 0.25 MG tablet    Sig: TAKE 1 TABLET(0.25 MG) BY MOUTH THREE TIMES DAILY    Dispense:  90 tablet    Refill:  1    No orders of the defined types were placed in this encounter.    Follow-up: Return in about 4 weeks (around 12/08/2019) for anxiety/depression.  An After Visit Summary was printed and given to the patient.  Rochel Brome [facility] [phone]

## 2019-11-13 DIAGNOSIS — H3561 Retinal hemorrhage, right eye: Secondary | ICD-10-CM | POA: Diagnosis not present

## 2019-11-16 ENCOUNTER — Other Ambulatory Visit (INDEPENDENT_AMBULATORY_CARE_PROVIDER_SITE_OTHER): Payer: Medicare PPO

## 2019-11-16 DIAGNOSIS — I10 Essential (primary) hypertension: Secondary | ICD-10-CM | POA: Diagnosis not present

## 2019-11-17 ENCOUNTER — Ambulatory Visit: Payer: Medicare PPO | Admitting: Family Medicine

## 2019-11-20 ENCOUNTER — Telehealth: Payer: Self-pay

## 2019-11-20 DIAGNOSIS — M5136 Other intervertebral disc degeneration, lumbar region: Secondary | ICD-10-CM | POA: Diagnosis not present

## 2019-11-20 DIAGNOSIS — M546 Pain in thoracic spine: Secondary | ICD-10-CM | POA: Diagnosis not present

## 2019-11-20 NOTE — Telephone Encounter (Signed)
Dr. Cox/Carolyn please advise.  We wasn't sure on the orders to put in for pt's Carotid US is it the stress? And the

## 2019-11-22 ENCOUNTER — Other Ambulatory Visit: Payer: Self-pay | Admitting: Family Medicine

## 2019-11-22 DIAGNOSIS — R011 Cardiac murmur, unspecified: Secondary | ICD-10-CM

## 2019-11-24 ENCOUNTER — Encounter: Payer: Self-pay | Admitting: Family Medicine

## 2019-11-25 DIAGNOSIS — R011 Cardiac murmur, unspecified: Secondary | ICD-10-CM | POA: Diagnosis not present

## 2019-11-25 DIAGNOSIS — E785 Hyperlipidemia, unspecified: Secondary | ICD-10-CM | POA: Diagnosis not present

## 2019-11-25 DIAGNOSIS — I6523 Occlusion and stenosis of bilateral carotid arteries: Secondary | ICD-10-CM | POA: Diagnosis not present

## 2019-11-25 DIAGNOSIS — Z720 Tobacco use: Secondary | ICD-10-CM | POA: Diagnosis not present

## 2019-11-28 DIAGNOSIS — J029 Acute pharyngitis, unspecified: Secondary | ICD-10-CM | POA: Diagnosis not present

## 2019-12-01 ENCOUNTER — Other Ambulatory Visit: Payer: Self-pay

## 2019-12-01 DIAGNOSIS — I6523 Occlusion and stenosis of bilateral carotid arteries: Secondary | ICD-10-CM

## 2019-12-03 ENCOUNTER — Other Ambulatory Visit: Payer: Self-pay | Admitting: Family Medicine

## 2019-12-10 ENCOUNTER — Other Ambulatory Visit: Payer: Self-pay

## 2019-12-10 ENCOUNTER — Ambulatory Visit (INDEPENDENT_AMBULATORY_CARE_PROVIDER_SITE_OTHER): Payer: Medicare PPO | Admitting: Family Medicine

## 2019-12-10 DIAGNOSIS — Z1231 Encounter for screening mammogram for malignant neoplasm of breast: Secondary | ICD-10-CM

## 2019-12-10 DIAGNOSIS — N958 Other specified menopausal and perimenopausal disorders: Secondary | ICD-10-CM

## 2019-12-10 DIAGNOSIS — F5101 Primary insomnia: Secondary | ICD-10-CM

## 2019-12-10 DIAGNOSIS — H93A3 Pulsatile tinnitus, bilateral: Secondary | ICD-10-CM

## 2019-12-10 DIAGNOSIS — I6523 Occlusion and stenosis of bilateral carotid arteries: Secondary | ICD-10-CM | POA: Diagnosis not present

## 2019-12-10 NOTE — Patient Instructions (Signed)
Wellbutrin XL (USED TO HELP QUIT SMOKING. ) IS AN ANTIDEPRESSANT/ANXIETY  Carotid Artery Disease  Carotid artery disease is the narrowing or blockage of one or both carotid arteries. This condition is also called carotid artery stenosis. The carotid arteries are the two main blood vessels on either side of the neck. They send blood to the brain, other parts of the head, and the neck.  This condition increases your risk for a stroke or a transient ischemic attack (TIA). A TIA is a "mini-stroke" that causes stroke-like symptoms that go away quickly. What are the causes? This condition is mainly caused by a narrowing and hardening of the carotid arteries. The carotid arteries can become narrow or clogged with a buildup of plaque. Plaque includes:  Fat.  Cholesterol.  Calcium.  Other substances. What increases the risk? The following factors may make you more likely to develop this condition:  Having certain medical conditions, such as: ? High cholesterol. ? High blood pressure. ? Diabetes. ? Obesity.  Smoking.  A family history of cardiovascular disease.  Not being active or lack of regular exercise.  Being female. Men have a higher risk of having arteries become narrow and harden earlier in life than women.  Old age. What are the signs or symptoms? This condition may not have any signs or symptoms until a stroke or TIA happens. In some cases, your doctor may be able to hear a whooshing sound. This can suggest a change in blood flow caused by plaque buildup. An eye exam can also help find signs of the condition. How is this treated? This condition may be treated with more than one treatment. Treatment options include:  Lifestyle changes, such as: ? Quitting smoking. ? Getting regular exercise, or getting exercise as told by your doctor. ? Eating a healthy diet. ? Managing stress. ? Keeping a healthy weight.  Medicines to control: ? Blood pressure. ? Cholesterol. ? Blood  clotting.  Surgery. You may have: ? A surgery to remove the blockages in the carotid arteries. ? A procedure in which a small mesh tube (stent) is used to widen the blocked carotid arteries. Follow these instructions at home: Eating and drinking Follow instructions about your diet from your doctor. It is important to follow a healthy diet.  Eat a diet that includes: ? A lot of fresh fruits and vegetables. ? Low-fat (lean) meats.  Avoid these foods: ? Foods that are high in fat. ? Foods that are high in salt (sodium). ? Foods that are fried. ? Foods that are processed. ? Foods that have few good nutrients (poor nutritional value).  Lifestyle   Keep a healthy weight.  Do exercises as told by your doctor to stay active. Each week, you should get one of the following: ? At least 150 minutes of exercise that raises your heart rate and makes you sweat (moderate-intensity exercise). ? At least 75 minutes of exercise that takes a lot of effort.  Do not use any products that contain nicotine or tobacco, such as cigarettes, e-cigarettes, and chewing tobacco. If you need help quitting, ask your doctor.  Do not drink alcohol if: ? Your doctor tells you not to drink. ? You are pregnant, may be pregnant, or are planning to become pregnant.  If you drink alcohol: ? Limit how much you use to:  0-1 drink a day for women.  0-2 drinks a day for men. ? Be aware of how much alcohol is in your drink. In the [place] one  drink equals one 12 oz bottle of beer (355 mL), one 5 oz glass of wine (148 mL), or one 1 oz glass of hard liquor (44 mL).  Do not use drugs.  Manage your stress. Ask your doctor for tips on how to do this. General instructions  Take over-the-counter and prescription medicines only as told by your doctor.  Keep all follow-up visits as told by your doctor. This is important. Where to find more information  American Heart Association: www.heart.org Get help right away  if:  You have any signs of a stroke. "BE FAST" is an easy way to remember the main warning signs: ? B - Balance. Signs are dizziness, sudden trouble walking, or loss of balance. ? E - Eyes. Signs are trouble seeing or a change in how you see. ? F - Face. Signs are sudden weakness or loss of feeling of the face, or the face or eyelid drooping on one side. ? A - Arms. Signs are weakness or loss of feeling in an arm. This happens suddenly and usually on one side of the body. ? S - Speech. Signs are sudden trouble speaking, slurred speech, or trouble understanding what people say. ? T - Time. Time to call emergency services. Write down what time symptoms started.  You have other signs of a stroke, such as: ? A sudden, very bad headache with no known cause. ? Feeling like you may vomit (nausea). ? Vomiting. ? A seizure. These symptoms may be an emergency. Do not wait to see if the symptoms will go away. Get medical help right away. Call your local emergency services (911 in the [place] Do not drive yourself to the hospital. Summary  The carotid arteries are blood vessels on both sides of the neck.  If these arteries get smaller or get blocked, you are more likely to have a stroke or a mini-stroke.  This condition can be treated with lifestyle changes, medicines, surgery, or a blend of these treatments.  Get help right away if you have any signs of a stroke. "BE FAST" is an easy way to remember the main warning signs of stroke. This information is not intended to replace advice given to you by your health care provider. Make sure you discuss any questions you have with your health care provider. Document Revised: 07/14/2018 Document Reviewed: 07/14/2018 Elsevier Patient Education  [place].

## 2019-12-10 NOTE — Progress Notes (Signed)
Subjective:  Patient ID: Allison Jimenez, female    DOB: Dec 08, 1950  Age: 69 y.o. MRN: [id number]  Chief Complaint  Patient presents with   Insomnia   HPI Carotid stenosis - pt has pulsatile tinnitus. Carotid artery [place] is abnormal. Echo was normal.   Insomnia - trazodone has helped.   Current Outpatient Medications on File Prior to Visit  Medication Sig Dispense Refill   albuterol (VENTOLIN HFA) 108 (90 Base) MCG/ACT inhaler Inhale into the lungs.     ALPRAZolam (XANAX) 0.25 MG tablet TAKE 1 TABLET(0.25 MG) BY MOUTH THREE TIMES DAILY 90 tablet 1   aspirin EC 325 MG tablet Take 325 mg by mouth daily.     meloxicam (MOBIC) 7.5 MG tablet Take 7.5 mg by mouth daily.     atorvastatin (LIPITOR) 20 MG tablet Take 1 tablet (20 mg total) by mouth daily. 90 tablet 1   chlorthalidone (HYGROTON) 25 MG tablet TAKE 1 TABLET(25 MG) BY MOUTH EVERY DAY 90 tablet 4   cyclobenzaprine (FLEXERIL) 10 MG tablet Take by mouth.     diclofenac Sodium (VOLTAREN) 1 % GEL diclofenac 1 % topical gel 100 g 0   dicyclomine (BENTYL) 20 MG tablet dicyclomine 20 mg tablet  TK 1 T PO BID PRN     Erenumab-aooe (AIMOVIG) 140 MG/ML SOAJ Aimovig Autoinjector 140 mg/mL subcutaneous auto-injector  Inject by subcutaneous route.     FLUoxetine (PROZAC) 20 MG capsule TAKE 2 CAPSULES BY MOUTH EVERY DAY 180 capsule 0   montelukast (SINGULAIR) 10 MG tablet Take 1 tablet (10 mg total) by mouth at bedtime. 30 tablet 2   nystatin-triamcinolone (MYCOLOG II) cream nystatin-triamcinolone 100,000 unit/g-0.1 % topical cream     ondansetron (ZOFRAN) 4 MG tablet Take 1 tablet (4 mg total) by mouth every 8 (eight) hours as needed for nausea or vomiting. 90 tablet 0   potassium chloride (MICRO-K) 10 MEQ CR capsule TAKE 1 CAPSULE(10 MEQ) BY MOUTH EVERY DAY WITH FOOD 90 capsule 1   promethazine (PHENERGAN) 25 MG tablet TAKE 1 TABLET BY MOUTH EVERY 8 HOURS IF NEEDED 20 tablet 0   rizatriptan (MAXALT) 10 MG tablet Take 1 tablet  (10 mg total) by mouth as needed for migraine. May repeat in 2 hours if needed 10 tablet 0   tiZANidine (ZANAFLEX) 4 MG tablet tizanidine 4 mg tablet     traZODone (DESYREL) 50 MG tablet Take 1 tablet (50 mg total) by mouth at bedtime as needed for sleep. 30 tablet 3   No current facility-administered medications on file prior to visit.   Past Medical History:  Diagnosis Date   Allergy    ANXIETY 06/05/2006   Arthritis    SHOULDERS    BACK PAIN 01/05/2010   Cataract    bilateral, left worse   Centrilobular emphysema ([facility])    Centrilobular emphysema ([facility])    COPD (chronic obstructive pulmonary disease) ([place])    DIVERTICULOSIS, COLON 06/05/2006   DIZZINESS OR VERTIGO 06/05/2006   positional   Hearing loss in left ear    HYPERLIPIDEMIA 06/05/2006   HYPOKALEMIA 12/14/2008   MENIERE'S DISEASE 06/25/2006   Migraine    TOBACCO USER 11/25/2007   Past Surgical History:  Procedure Laterality Date   APPENDECTOMY     CESAREAN SECTION     x1   CHOLECYSTECTOMY     COLONOSCOPY     COSMETIC SURGERY     mastoid surgery  1992   shunt in mastoid- endolymphatic sac in left ear  TUBAL LIGATION     vocal cord surgery     nodule x2    Family History  Problem Relation Age of Onset   Breast cancer Mother    Migraines Other    Depression Other    Anxiety disorder Other    High Cholesterol Other    Colon cancer Neg Hx    Rectal cancer Neg Hx    Stomach cancer Neg Hx    Colon polyps Neg Hx    Esophageal cancer Neg Hx    Social History   Socioeconomic History   Marital status: Divorced    Spouse name: Not on file   Number of children: 1   Years of education: 13   Highest education level: Not on file  Occupational History    Employer: [employer]    Comment: Retired  Tobacco Use   Smoking status: Current Every Day Smoker    Packs/day: 1.50    Years: 43.00    Pack years: 64.50    Types: Cigarettes   Smokeless tobacco: Never Used    Tobacco comment: using electronic cigs.-12 cigs a day as well  Substance and Sexual Activity   Alcohol use: No    Alcohol/week: 0.0 standard drinks   Drug use: No   Sexual activity: Not on file  Other Topics Concern   Not on file  Social History Narrative   Patient lives at home alone. Patient has a Data processing manager. Patient is divorced .   Patient is retired.   Education high school.   Right handed.   Caffeine none.   Social Determinants of Health   Financial Resource Strain: Not on file  Food Insecurity: Not on file  Transportation Needs: Not on file  Physical Activity: Not on file  Stress: Not on file  Social Connections: Not on file    Review of Systems  Constitutional: Negative for chills, fatigue and fever.  HENT: Positive for sore throat. Negative for congestion and rhinorrhea.   Respiratory: Negative for cough and shortness of breath.   Cardiovascular: Positive for palpitations (pounding sensation in left ear). Negative for chest pain.  Gastrointestinal: Negative for abdominal pain, constipation, diarrhea, nausea and vomiting.  Genitourinary: Negative for dysuria and urgency.  Musculoskeletal: Negative for back pain and myalgias.  Neurological: Positive for headaches. Negative for dizziness, weakness and light-headedness.  Psychiatric/Behavioral: Negative for dysphoric mood. The patient is not nervous/anxious.      Objective:  There were no vitals taken for this visit.  BP/Weight 11/10/2019 10/15/2019 02/21/2540  Systolic BP 706 237 628  Diastolic BP 64 80 82  Wt. (Lbs) 128 129.8 128  BMI 24.59 23.74 23.79    Physical Exam Vitals reviewed.  Constitutional:      Appearance: Normal appearance. She is normal weight.  Cardiovascular:     Rate and Rhythm: Normal rate and regular rhythm.     Pulses: Normal pulses.     Heart sounds: Normal heart sounds.  Pulmonary:     Effort: Pulmonary effort is normal. No respiratory distress.     Breath  sounds: Normal breath sounds.  Neurological:     Mental Status: She is alert and oriented to person, place, and time.  Psychiatric:        Mood and Affect: Mood normal.        Behavior: Behavior normal.     Assessment & Plan:   1. Bilateral carotid artery stenosis Continue lipitor and aspirin. Keep appointment with vascular surgeon. Reviewed carotid US  and echo results.   2. Pulsatile tinnitus of both ears Concerning for being due to carotid stenosis.  3. Encounter for screening mammogram for breast cancer - [facility] DIGITAL SCREENING BILATERAL  4. Other specified menopausal and perimenopausal disorders - DG Bone Density  5. Primary insomnia Improved.   Continue trazodone 50 mg once at night.   Orders Placed This Encounter  Procedures   MM DIGITAL SCREENING BILATERAL   DG Bone Density     Follow-up: No follow-ups on file.  An After Visit Summary was printed and given to the patient.  Rochel Brome, MD [facility] [phone]

## 2019-12-11 ENCOUNTER — Encounter: Payer: Self-pay | Admitting: Family Medicine

## 2019-12-17 ENCOUNTER — Other Ambulatory Visit: Payer: Self-pay | Admitting: Family Medicine

## 2019-12-17 DIAGNOSIS — J302 Other seasonal allergic rhinitis: Secondary | ICD-10-CM

## 2020-01-11 ENCOUNTER — Other Ambulatory Visit: Payer: Self-pay | Admitting: Family Medicine

## 2020-01-12 ENCOUNTER — Encounter: Payer: Self-pay | Admitting: Vascular Surgery

## 2020-01-12 ENCOUNTER — Other Ambulatory Visit: Payer: Self-pay

## 2020-01-12 ENCOUNTER — Ambulatory Visit: Payer: Medicare PPO | Admitting: Vascular Surgery

## 2020-01-12 VITALS — BP 118/66 | HR 95 | Temp 97.5°F | Resp 20 | Ht 60.0 in | Wt 124.0 lb

## 2020-01-12 DIAGNOSIS — I6523 Occlusion and stenosis of bilateral carotid arteries: Secondary | ICD-10-CM

## 2020-01-12 NOTE — Progress Notes (Signed)
ASSESSMENT & PLAN:  Allison Jimenez is a 70 y.o. female with asymptomatic bilateral carotid artery stenosis (R [PSV 219] > L [PSV 104]).   I do not think the patient is a candidate for any type of intervention for carotid artery stenosis as of yet. Will avoid intervention until patient becomes symptomatic or lesion reaches 80% threshold for asymptomatic intervention.  The patient should continue best medical therapy for carotid artery stenosis including: Complete cessation from all tobacco products. Blood glucose control with goal A1c < 7%. Blood pressure control with goal blood pressure < 140/90 mmHg. Lipid reduction therapy with goal LDL-C <100 mg/dL (<70 if symptomatic from carotid artery stenosis).  Aspirin 81mg  PO QD.  Atorvastatin 40-80mg  PO QD (or other "high intensity" statin therapy).  Follow up with me or PA in 6 months with repeat duplex.  CHIEF COMPLAINT:   Carotid stenosis  HISTORY:  HISTORY OF PRESENT ILLNESS: Allison Jimenez is a 70 y.o. female referred to clinic for evaluation of carotid artery stenosis.  This was found in the course of the work-up of severe headache, pulsatile tinnitus.  Patient reports a long history of Mnire's disease, migraine headache, anxiety.  She denies any symptoms typical of stroke or TIA.  She specifically denies amaurosis, unilateral weakness or numbness, dysphagia or dysarthria.  Course of work-up a carotid duplex was obtained.  This revealed bilateral carotid artery stenosis.  She is here to discuss.  Past Medical History:  Diagnosis Date  . Allergy   . ANXIETY 06/05/2006  . Arthritis    SHOULDERS   . BACK PAIN 01/05/2010  . Cataract    bilateral, left worse  . Centrilobular emphysema ([place])   . Centrilobular emphysema ([place])   . COPD (chronic obstructive pulmonary disease) ([place])   . DIVERTICULOSIS, COLON 06/05/2006  . DIZZINESS OR VERTIGO 06/05/2006   positional  . Hearing loss in left ear   . HYPERLIPIDEMIA 06/05/2006  . HYPOKALEMIA  12/14/2008  . MENIERE'S DISEASE 06/25/2006  . Migraine   . TOBACCO USER 11/25/2007    Past Surgical History:  Procedure Laterality Date  . APPENDECTOMY    . CESAREAN SECTION     x1  . CHOLECYSTECTOMY    . COLONOSCOPY    . COSMETIC SURGERY    . mastoid surgery  1992   shunt in mastoid- endolymphatic sac in left ear   . TUBAL LIGATION    . vocal cord surgery     nodule x2    Family History  Problem Relation Age of Onset  . Breast cancer Mother   . Migraines Other   . Depression Other   . Anxiety disorder Other   . High Cholesterol Other   . Colon cancer Neg Hx   . Rectal cancer Neg Hx   . Stomach cancer Neg Hx   . Colon polyps Neg Hx   . Esophageal cancer Neg Hx     Social History   Socioeconomic History  . Marital status: Divorced    Spouse name: Not on file  . Number of children: 1  . Years of education: 62  . Highest education level: Not on file  Occupational History    Employer: [employer]    Comment: Retired  Tobacco Use  . Smoking status: Current Every Day Smoker    Packs/day: 1.00    Years: 43.00    Pack years: 43.00    Types: Cigarettes  . Smokeless tobacco: Never Used  . Tobacco comment: using electronic  cigs.-12 cigs a day as well  Vaping Use  . Vaping Use: Some days  . Substances: Nicotine  Substance and Sexual Activity  . Alcohol use: No    Alcohol/week: 0.0 standard drinks  . Drug use: No  . Sexual activity: Not on file  Other Topics Concern  . Not on file  Social History Narrative   Patient lives at home alone. Patient has a Data processing manager. Patient is divorced .   Patient is retired.   Education high school.   Right handed.   Caffeine none.   Social Determinants of Health   Financial Resource Strain: Not on file  Food Insecurity: Not on file  Transportation Needs: Not on file  Physical Activity: Not on file  Stress: Not on file  Social Connections: Not on file  Intimate Partner Violence: Not on file     Allergies  Allergen Reactions  . Erythromycin Base Diarrhea  . Penicillins     Current Outpatient Medications  Medication Sig Dispense Refill  . albuterol (VENTOLIN HFA) 108 (90 Base) MCG/ACT inhaler Inhale into the lungs.    . ALPRAZolam (XANAX) 0.25 MG tablet TAKE 1 TABLET(0.25 MG) BY MOUTH THREE TIMES DAILY 90 tablet 2  . aspirin EC 325 MG tablet Take 325 mg by mouth daily.    Marland Kitchen atorvastatin (LIPITOR) 20 MG tablet Take 1 tablet (20 mg total) by mouth daily. 90 tablet 1  . chlorthalidone (HYGROTON) 25 MG tablet TAKE 1 TABLET(25 MG) BY MOUTH EVERY DAY 90 tablet 4  . cyclobenzaprine (FLEXERIL) 10 MG tablet Take by mouth.    . diclofenac Sodium (VOLTAREN) 1 % GEL diclofenac 1 % topical gel 100 g 0  . dicyclomine (BENTYL) 20 MG tablet dicyclomine 20 mg tablet  TK 1 T PO BID PRN    . Erenumab-aooe (AIMOVIG) 140 MG/ML SOAJ Aimovig Autoinjector 140 mg/mL subcutaneous auto-injector  Inject by subcutaneous route.    Marland Kitchen FLUoxetine (PROZAC) 20 MG capsule TAKE 2 CAPSULES BY MOUTH EVERY DAY 180 capsule 0  . meloxicam (MOBIC) 7.5 MG tablet Take 7.5 mg by mouth daily.    . montelukast (SINGULAIR) 10 MG tablet TAKE 1 TABLET(10 MG) BY MOUTH AT BEDTIME 90 tablet 2  . nystatin-triamcinolone (MYCOLOG II) cream nystatin-triamcinolone 100,000 unit/g-0.1 % topical cream    . ondansetron (ZOFRAN) 4 MG tablet Take 1 tablet (4 mg total) by mouth every 8 (eight) hours as needed for nausea or vomiting. 90 tablet 0  . potassium chloride (MICRO-K) 10 MEQ CR capsule TAKE 1 CAPSULE(10 MEQ) BY MOUTH EVERY DAY WITH FOOD 90 capsule 1  . promethazine (PHENERGAN) 25 MG tablet TAKE 1 TABLET BY MOUTH EVERY 8 HOURS IF NEEDED 20 tablet 0  . rizatriptan (MAXALT) 10 MG tablet Take 1 tablet (10 mg total) by mouth as needed for migraine. May repeat in 2 hours if needed 10 tablet 0  . traZODone (DESYREL) 50 MG tablet Take 1 tablet (50 mg total) by mouth at bedtime as needed for sleep. 30 tablet 3   No current  facility-administered medications for this visit.    REVIEW OF SYSTEMS:  [X]  denotes positive finding, [ ]  denotes negative finding Cardiac  Comments:  Chest pain or chest pressure:    Shortness of breath upon exertion:    Short of breath when lying flat:    Irregular heart rhythm:        Vascular    Pain in calf, thigh, or hip brought on by ambulation:    Pain in  feet at night that wakes you up from your sleep:     Blood clot in your veins:    Leg swelling:         Pulmonary    Oxygen at home:    Productive cough:     Wheezing:  x       Neurologic    Sudden weakness in arms or legs:     Sudden numbness in arms or legs:     Sudden onset of difficulty speaking or slurred speech:    Temporary loss of vision in one eye:     Problems with dizziness:  x       Gastrointestinal    Blood in stool:     Vomited blood:         Genitourinary    Burning when urinating:     Blood in urine:        Psychiatric    Major depression:  x       Hematologic    Bleeding problems:    Problems with blood clotting too easily:        Skin    Rashes or ulcers:        Constitutional    Fever or chills:     PHYSICAL EXAM:   Vitals:   01/12/20 1333 01/12/20 1335  BP: (!) 141/77 118/66  Pulse: 95   Resp: 20   Temp: (!) 97.5 F (36.4 C)   SpO2: 98%   Weight: 124 lb (56.2 kg)   Height: 5' (1.524 m)    Constitutional: Well appearing in no distress. Appears well nourished.  Neurologic: Normal gait and station. CN intact. No weakness. No sensory loss. Psychiatric: Mood and affect symmetric and appropriate. Eyes: No icterus. No conjunctival pallor. Ears, nose, throat: mucous membranes moist. Midline trachea. Cardiac: regular rate and rhythm.  Respiratory: unlabored. Abdominal: soft, non-tender, non-distended.  Extremity: No edema. No cyanosis. No pallor.  Skin: No gangrene. No ulceration.  Lymphatic: No Stemmer's sign. No palpable lymphadenopathy.'  DATA REVIEW:    Most recent  CBC CBC Latest Ref Rng & Units 10/15/2019 06/15/2019 02/27/2019  WBC 3.4 - 10.8 x10E3/uL 8.0 8.6 8.5  Hemoglobin 11.1 - 15.9 g/dL 12.3 11.7 13.7  Hematocrit 34.0 - 46.6 % 37.4 35.8 39.4  Platelets 150 - 450 x10E3/uL 344 357 362     Most recent CMP CMP Latest Ref Rng & Units 10/15/2019 06/15/2019 02/27/2019  Glucose 65 - 99 mg/dL 99 90 95  BUN 8 - 27 mg/dL 10 14 11   Creatinine 0.57 - 1.00 mg/dL 0.66 0.73 0.76  Sodium 134 - 144 mmol/L 132(L) 131(L) 134  Potassium 3.5 - 5.2 mmol/L 3.9 3.9 4.0  Chloride 96 - 106 mmol/L 94(L) 93(L) 92(L)  CO2 20 - 29 mmol/L 26 26 -  Calcium 8.7 - 10.3 mg/dL 9.7 10.0 10.2  Total Protein 6.0 - 8.5 g/dL 6.4 6.7 6.9  Total Bilirubin 0.0 - 1.2 mg/dL 0.4 0.2 0.4  Alkaline Phos 44 - 121 IU/L 61 56 66  AST 0 - 40 IU/L 18 19 18   ALT 0 - 32 IU/L 17 24 -    Renal function CrCl cannot be calculated (Patient's most recent lab result is older than the maximum 21 days allowed.).  Hgb A1c MFr Bld (%)  Date Value  06/18/2006 6.5 (H)    LDL Chol Calc (NIH)  Date Value Ref Range Status  10/15/2019 73 0 - 99 mg/dL Final   Direct LDL  Date Value Ref  Range Status  09/13/2011 151.9 mg/dL Final    Comment:    Optimal:  <100 mg/dLNear or Above Optimal:  100-129 mg/dLBorderline High:  130-159 mg/dLHigh:  160-189 mg/dLVery High:  >190 mg/dL     Vascular Imaging: See outside report 11/22/19 (copied in A/P above).  Yevonne Aline. Stanford Breed, MD [facility] Phone Number: [phone] 01/12/2020 1:41 PM

## 2020-01-13 ENCOUNTER — Other Ambulatory Visit: Payer: Self-pay

## 2020-01-13 DIAGNOSIS — I6523 Occlusion and stenosis of bilateral carotid arteries: Secondary | ICD-10-CM

## 2020-01-15 DIAGNOSIS — N958 Other specified menopausal and perimenopausal disorders: Secondary | ICD-10-CM | POA: Diagnosis not present

## 2020-01-15 DIAGNOSIS — M8589 Other specified disorders of bone density and structure, multiple sites: Secondary | ICD-10-CM | POA: Diagnosis not present

## 2020-01-15 DIAGNOSIS — Z20828 Contact with and (suspected) exposure to other viral communicable diseases: Secondary | ICD-10-CM | POA: Diagnosis not present

## 2020-01-15 DIAGNOSIS — M791 Myalgia, unspecified site: Secondary | ICD-10-CM | POA: Diagnosis not present

## 2020-01-15 DIAGNOSIS — Z1231 Encounter for screening mammogram for malignant neoplasm of breast: Secondary | ICD-10-CM | POA: Diagnosis not present

## 2020-01-15 DIAGNOSIS — J0100 Acute maxillary sinusitis, unspecified: Secondary | ICD-10-CM | POA: Diagnosis not present

## 2020-01-19 ENCOUNTER — Other Ambulatory Visit: Payer: Self-pay | Admitting: Family Medicine

## 2020-01-22 ENCOUNTER — Ambulatory Visit: Payer: Medicare PPO

## 2020-02-15 ENCOUNTER — Other Ambulatory Visit: Payer: Self-pay

## 2020-02-15 ENCOUNTER — Encounter: Payer: Self-pay | Admitting: Family Medicine

## 2020-02-15 ENCOUNTER — Ambulatory Visit: Payer: Medicare PPO | Admitting: Family Medicine

## 2020-02-15 VITALS — BP 118/56 | HR 104 | Temp 95.9°F | Ht 60.0 in | Wt 124.0 lb

## 2020-02-15 DIAGNOSIS — E785 Hyperlipidemia, unspecified: Secondary | ICD-10-CM

## 2020-02-15 DIAGNOSIS — H8103 Meniere's disease, bilateral: Secondary | ICD-10-CM

## 2020-02-15 DIAGNOSIS — F330 Major depressive disorder, recurrent, mild: Secondary | ICD-10-CM | POA: Diagnosis not present

## 2020-02-15 DIAGNOSIS — G43109 Migraine with aura, not intractable, without status migrainosus: Secondary | ICD-10-CM | POA: Diagnosis not present

## 2020-02-15 DIAGNOSIS — J029 Acute pharyngitis, unspecified: Secondary | ICD-10-CM | POA: Diagnosis not present

## 2020-02-15 DIAGNOSIS — F5101 Primary insomnia: Secondary | ICD-10-CM

## 2020-02-15 DIAGNOSIS — Z122 Encounter for screening for malignant neoplasm of respiratory organs: Secondary | ICD-10-CM

## 2020-02-15 LAB — POCT RAPID STREP A (OFFICE): Rapid Strep A Screen: NEGATIVE

## 2020-02-15 NOTE — Progress Notes (Signed)
Subjective:  Patient ID: Allison Jimenez, female    DOB: 1950/04/27  Age: 71 y.o. MRN: [id number]  Chief Complaint  Patient presents with  . Hypertension  . Hyperlipidemia    HPI Primary insomnia: on trazodone.   Meniere: On chlorthalidone.   Major depressive disorder, recurrent episode, mild (HCC) - on trazodone and prozac 20 mg 2 capsules daily. Also on xanax 0.25 mg I po tid.   Dyslipidemia: on atorvastatin.   Carotid stenosis: checked by Dr. Luan Pulling. He recommended recheck the left carotid in 6 weeks.   Pt continues to have throbbing headache and feels like she hears her heart beat intermittently. Pt sees neurology. She gets aimovig every 2 weeks. Acute treatment with maxalt about twice a week.   Current Outpatient Medications on File Prior to Visit  Medication Sig Dispense Refill  . albuterol (VENTOLIN HFA) 108 (90 Base) MCG/ACT inhaler Inhale into the lungs.    . ALPRAZolam (XANAX) 0.25 MG tablet TAKE 1 TABLET(0.25 MG) BY MOUTH THREE TIMES DAILY 90 tablet 2  . aspirin EC 325 MG tablet Take 325 mg by mouth daily.    Marland Kitchen atorvastatin (LIPITOR) 20 MG tablet Take 1 tablet (20 mg total) by mouth daily. 90 tablet 1  . chlorthalidone (HYGROTON) 25 MG tablet TAKE 1 TABLET(25 MG) BY MOUTH EVERY DAY 90 tablet 4  . cyclobenzaprine (FLEXERIL) 10 MG tablet Take by mouth.    . diclofenac Sodium (VOLTAREN) 1 % GEL diclofenac 1 % topical gel 100 g 0  . dicyclomine (BENTYL) 20 MG tablet dicyclomine 20 mg tablet  TK 1 T PO BID PRN    . Erenumab-aooe (AIMOVIG) 140 MG/ML SOAJ Aimovig Autoinjector 140 mg/mL subcutaneous auto-injector  Inject by subcutaneous route.    Marland Kitchen FLUoxetine (PROZAC) 20 MG capsule TAKE 2 CAPSULES BY MOUTH EVERY DAY 180 capsule 0  . meloxicam (MOBIC) 7.5 MG tablet Take 7.5 mg by mouth daily.    . montelukast (SINGULAIR) 10 MG tablet TAKE 1 TABLET(10 MG) BY MOUTH AT BEDTIME 90 tablet 2  . nystatin-triamcinolone (MYCOLOG II) cream nystatin-triamcinolone 100,000 unit/g-0.1 %  topical cream    . ondansetron (ZOFRAN) 4 MG tablet TAKE 1 TABLET(4 MG) BY MOUTH EVERY 8 HOURS AS NEEDED FOR NAUSEA OR VOMITING 90 tablet 0  . potassium chloride (MICRO-K) 10 MEQ CR capsule TAKE 1 CAPSULE(10 MEQ) BY MOUTH EVERY DAY WITH FOOD 90 capsule 1  . promethazine (PHENERGAN) 25 MG tablet TAKE 1 TABLET BY MOUTH EVERY 8 HOURS IF NEEDED 20 tablet 0  . rizatriptan (MAXALT) 10 MG tablet Take 1 tablet (10 mg total) by mouth as needed for migraine. May repeat in 2 hours if needed 10 tablet 0  . traZODone (DESYREL) 50 MG tablet Take 1 tablet (50 mg total) by mouth at bedtime as needed for sleep. 30 tablet 3   No current facility-administered medications on file prior to visit.   Past Medical History:  Diagnosis Date  . Allergy   . ANXIETY 06/05/2006  . Arthritis    SHOULDERS   . BACK PAIN 01/05/2010  . Cataract    bilateral, left worse  . Centrilobular emphysema ([place])   . Centrilobular emphysema ([place])   . COPD (chronic obstructive pulmonary disease) ([place])   . DIVERTICULOSIS, COLON 06/05/2006  . DIZZINESS OR VERTIGO 06/05/2006   positional  . Hearing loss in left ear   . HYPERLIPIDEMIA 06/05/2006  . HYPOKALEMIA 12/14/2008  . MENIERE'S DISEASE 06/25/2006  . Migraine   . TOBACCO USER 11/25/2007  Past Surgical History:  Procedure Laterality Date  . APPENDECTOMY    . CESAREAN SECTION     x1  . CHOLECYSTECTOMY    . COLONOSCOPY    . COSMETIC SURGERY    . mastoid surgery  1992   shunt in mastoid- endolymphatic sac in left ear   . TUBAL LIGATION    . vocal cord surgery     nodule x2    Family History  Problem Relation Age of Onset  . Breast cancer Mother   . Migraines Other   . Depression Other   . Anxiety disorder Other   . High Cholesterol Other   . Colon cancer Neg Hx   . Rectal cancer Neg Hx   . Stomach cancer Neg Hx   . Colon polyps Neg Hx   . Esophageal cancer Neg Hx    Social History   Socioeconomic History  . Marital status: Divorced    Spouse name: Not on file  .  Number of children: 1  . Years of education: 17  . Highest education level: Not on file  Occupational History    Employer: [employer]    Comment: Retired  Tobacco Use  . Smoking status: Current Every Day Smoker    Packs/day: 1.00    Years: 43.00    Pack years: 43.00    Types: Cigarettes  . Smokeless tobacco: Never Used  . Tobacco comment: using electronic cigs.-12 cigs a day as well  Vaping Use  . Vaping Use: Some days  . Substances: Nicotine  Substance and Sexual Activity  . Alcohol use: No    Alcohol/week: 0.0 standard drinks  . Drug use: No  . Sexual activity: Not on file  Other Topics Concern  . Not on file  Social History Narrative   Patient lives at home alone. Patient has a Data processing manager. Patient is divorced .   Patient is retired.   Education high school.   Right handed.   Caffeine none.   Social Determinants of Health   Financial Resource Strain: Not on file  Food Insecurity: Not on file  Transportation Needs: Not on file  Physical Activity: Not on file  Stress: Not on file  Social Connections: Not on file    Review of Systems  Constitutional: Negative for chills, fatigue and fever.  HENT: Positive for rhinorrhea and sore throat. Negative for congestion and ear pain.   Respiratory: Negative for cough and shortness of breath.   Cardiovascular: Negative for chest pain.  Gastrointestinal: Negative for abdominal pain, constipation, diarrhea, nausea and vomiting.  Genitourinary: Negative for dysuria and urgency.  Musculoskeletal: Negative for back pain and myalgias.  Neurological: Negative for dizziness, weakness, light-headedness and headaches.  Psychiatric/Behavioral: Negative for dysphoric mood. The patient is not nervous/anxious.      Objective:  BP (!) 118/56   Pulse (!) 104   Temp (!) 95.9 F (35.5 C)   Ht 5' (1.524 m)   Wt 124 lb (56.2 kg)   SpO2 100%   BMI 24.22 kg/m   BP/Weight 02/15/2020 01/12/2020 71/0/6269   Systolic BP 485 462 703  Diastolic BP 56 66 64  Wt. (Lbs) 124 124 128  BMI 24.22 24.22 24.59    Physical Exam Vitals reviewed.  Constitutional:      Appearance: Normal appearance. She is normal weight.  HENT:     Nose: Nose normal.     Mouth/Throat:     Pharynx: Posterior oropharyngeal erythema present.  Neck:  Vascular: No carotid bruit.  Cardiovascular:     Rate and Rhythm: Normal rate and regular rhythm.     Pulses: Normal pulses.     Heart sounds: Normal heart sounds.  Pulmonary:     Effort: Pulmonary effort is normal. No respiratory distress.     Breath sounds: Normal breath sounds.  Abdominal:     General: Abdomen is flat. Bowel sounds are normal.     Palpations: Abdomen is soft.     Tenderness: There is no abdominal tenderness.  Neurological:     Mental Status: She is alert and oriented to person, place, and time.  Psychiatric:        Mood and Affect: Mood normal.        Behavior: Behavior normal.     Diabetic Foot Exam - Simple   No data filed      Lab Results  Component Value Date   WBC 8.0 10/15/2019   HGB 12.3 10/15/2019   HCT 37.4 10/15/2019   PLT 344 10/15/2019   GLUCOSE 99 10/15/2019   CHOL 141 10/15/2019   TRIG 134 10/15/2019   HDL 44 10/15/2019   LDLDIRECT 151.9 09/13/2011   LDLCALC 73 10/15/2019   ALT 17 10/15/2019   AST 18 10/15/2019   NA 132 (L) 10/15/2019   K 3.9 10/15/2019   CL 94 (L) 10/15/2019   CREATININE 0.66 10/15/2019   BUN 10 10/15/2019   CO2 26 10/15/2019   TSH 2.470 06/15/2019   HGBA1C 6.5 (H) 06/18/2006      Assessment & Plan:   1. Dyslipidemia Well controlled.  No changes to medicines.  Continue to work on eating a healthy diet and exercise.  Labs drawn today.  - Lipid panel - CBC with Differential/Platelet - Comprehensive metabolic panel  2. Primary insomnia The current medical regimen is effective;  continue present plan and medications.  3. Major depressive disorder, recurrent episode, mild  (HCC) The current medical regimen is effective;  continue present plan and medications.  4. Sorethroat - POCT rapid strep A negative. Recommend quit smoking.  Recommend gargle warm salt water.   5. Meniere's disease of both ears The current medical regimen is effective;  continue present plan and medications.  6. Migraine with aura and without status migrainosus, not intractable The current medical regimen is effective;  continue present plan and medications.  7. Encounter for screening for lung cancer Pulmonology has a ct scan of chest for screening ordered for March.   Orders Placed This Encounter  Procedures  . CBC with Differential/Platelet  . Comprehensive metabolic panel  . Lipid panel  . POCT rapid strep A     Follow-up: Return in about 4 months (around 06/14/2020) for FASTING.  An After Visit Summary was printed and given to the patient.  Rochel Brome, MD [facility] [phone]

## 2020-02-16 LAB — CARDIOVASCULAR RISK ASSESSMENT

## 2020-02-16 LAB — COMPREHENSIVE METABOLIC PANEL
ALT: 13 IU/L (ref 0–32)
AST: 19 IU/L (ref 0–40)
Albumin/Globulin Ratio: 1.8 (ref 1.2–2.2)
Albumin: 4.3 g/dL (ref 3.8–4.8)
Alkaline Phosphatase: 63 IU/L (ref 44–121)
BUN/Creatinine Ratio: 19 (ref 12–28)
BUN: 13 mg/dL (ref 8–27)
Bilirubin Total: 0.2 mg/dL (ref 0.0–1.2)
CO2: 25 mmol/L (ref 20–29)
Calcium: 9.8 mg/dL (ref 8.7–10.3)
Chloride: 95 mmol/L — ABNORMAL LOW (ref 96–106)
Creatinine, Ser: 0.69 mg/dL (ref 0.57–1.00)
GFR calc Af Amer: 103 mL/min/{1.73_m2} (ref >59)
GFR calc non Af Amer: 90 mL/min/{1.73_m2} (ref >59)
Globulin, Total: 2.4 g/dL (ref 1.5–4.5)
Glucose: 106 mg/dL — ABNORMAL HIGH (ref 65–99)
Potassium: 3.8 mmol/L (ref 3.5–5.2)
Sodium: 135 mmol/L (ref 134–144)
Total Protein: 6.7 g/dL (ref 6.0–8.5)

## 2020-02-16 LAB — CBC WITH DIFFERENTIAL/PLATELET
Basophils Absolute: 0.1 10*3/uL (ref 0.0–0.2)
Basos: 1 %
EOS (ABSOLUTE): 0.5 10*3/uL — ABNORMAL HIGH (ref 0.0–0.4)
Eos: 7 %
Hematocrit: 37 % (ref 34.0–46.6)
Hemoglobin: 11.7 g/dL (ref 11.1–15.9)
Immature Grans (Abs): 0 10*3/uL (ref 0.0–0.1)
Immature Granulocytes: 0 %
Lymphocytes Absolute: 2.3 10*3/uL (ref 0.7–3.1)
Lymphs: 32 %
MCH: 26 pg — ABNORMAL LOW (ref 26.6–33.0)
MCHC: 31.6 g/dL (ref 31.5–35.7)
MCV: 82 fL (ref 79–97)
Monocytes Absolute: 0.8 10*3/uL (ref 0.1–0.9)
Monocytes: 11 %
Neutrophils Absolute: 3.5 10*3/uL (ref 1.4–7.0)
Neutrophils: 49 %
Platelets: 411 10*3/uL (ref 150–450)
RBC: 4.5 x10E6/uL (ref 3.77–5.28)
RDW: 14.3 % (ref 11.7–15.4)
WBC: 7.2 10*3/uL (ref 3.4–10.8)

## 2020-02-16 LAB — LIPID PANEL
Chol/HDL Ratio: 2.7 ratio (ref 0.0–4.4)
Cholesterol, Total: 122 mg/dL (ref 100–199)
HDL: 46 mg/dL (ref >39)
LDL Chol Calc (NIH): 52 mg/dL (ref 0–99)
Triglycerides: 135 mg/dL (ref 0–149)
VLDL Cholesterol Cal: 24 mg/dL (ref 5–40)

## 2020-02-19 DIAGNOSIS — M546 Pain in thoracic spine: Secondary | ICD-10-CM | POA: Diagnosis not present

## 2020-02-19 DIAGNOSIS — M542 Cervicalgia: Secondary | ICD-10-CM | POA: Diagnosis not present

## 2020-02-23 ENCOUNTER — Telehealth (INDEPENDENT_AMBULATORY_CARE_PROVIDER_SITE_OTHER): Payer: Medicare PPO | Admitting: Nurse Practitioner

## 2020-02-23 ENCOUNTER — Encounter: Payer: Self-pay | Admitting: Nurse Practitioner

## 2020-02-23 ENCOUNTER — Telehealth: Payer: Self-pay

## 2020-02-23 VITALS — Ht 60.0 in | Wt 124.0 lb

## 2020-02-23 DIAGNOSIS — J00 Acute nasopharyngitis [common cold]: Secondary | ICD-10-CM

## 2020-02-23 DIAGNOSIS — F17219 Nicotine dependence, cigarettes, with unspecified nicotine-induced disorders: Secondary | ICD-10-CM

## 2020-02-23 MED ORDER — AZITHROMYCIN 250 MG PO TABS: ORAL_TABLET | ORAL | 0 refills | Status: DC

## 2020-02-23 NOTE — Telephone Encounter (Signed)
Pt left message stating her throat is still hurting.  Called pt back. Pt states her throat is still hurting, hoarse.  She sounds hoarse, also complains of cough, nasal drainage, sore throat.   [name], [place] 02/23/20 11:44 AM

## 2020-02-23 NOTE — Progress Notes (Signed)
Virtual Visit via Telephone Note   This visit type was conducted due to national recommendations for restrictions regarding the COVID-19 Pandemic (e.g. social distancing) in an effort to limit this patient's exposure and mitigate transmission in our community.  Due to her co-morbid illnesses, this patient is at least at moderate risk for complications without adequate follow up.  This format is felt to be most appropriate for this patient at this time.  The patient did not have access to video technology/had technical difficulties with video requiring transitioning to audio format only (telephone).  All issues noted in this document were discussed and addressed.  No physical exam could be performed with this format.  Patient verbally consented to a telehealth visit.   Date:  02/23/2020   ID:  Allison Jimenez, DOB 03/04/1950, MRN [id number]  Patient Location: Home Provider Location: Office/Clinic  PCP:  Rochel Brome, MD   Evaluation Performed:  Established patient, acute telemedicine visit  Chief Complaint: Sore throat  History of Present Illness:    Allison Jimenez is a 70 y.o. female with sore throat and rhinorrhea for 8 days. Treatment has included Singulair and Zyrtec. She was seen in office on 02/15/20, strep test negative. States sore throat has not subsided. States she is experiencing hoarse voice.  The patient does have symptoms concerning for COVID-19 infection (fever, chills, cough, or new shortness of breath).    Past Medical History:  Diagnosis Date  . Allergy   . ANXIETY 06/05/2006  . Arthritis    SHOULDERS   . BACK PAIN 01/05/2010  . Cataract    bilateral, left worse  . Centrilobular emphysema ([place])   . Centrilobular emphysema ([place])   . COPD (chronic obstructive pulmonary disease) ([place])   . DIVERTICULOSIS, COLON 06/05/2006  . DIZZINESS OR VERTIGO 06/05/2006   positional  . Hearing loss in left ear   . HYPERLIPIDEMIA 06/05/2006  . HYPOKALEMIA 12/14/2008  . MENIERE'S  DISEASE 06/25/2006  . Migraine   . TOBACCO USER 11/25/2007    Past Surgical History:  Procedure Laterality Date  . APPENDECTOMY    . CESAREAN SECTION     x1  . CHOLECYSTECTOMY    . COLONOSCOPY    . COSMETIC SURGERY    . mastoid surgery  1992   shunt in mastoid- endolymphatic sac in left ear   . TUBAL LIGATION    . vocal cord surgery     nodule x2    Family History  Problem Relation Age of Onset  . Breast cancer Mother   . Migraines Other   . Depression Other   . Anxiety disorder Other   . High Cholesterol Other   . Colon cancer Neg Hx   . Rectal cancer Neg Hx   . Stomach cancer Neg Hx   . Colon polyps Neg Hx   . Esophageal cancer Neg Hx     Social History   Socioeconomic History  . Marital status: Divorced    Spouse name: Not on file  . Number of children: 1  . Years of education: 46  . Highest education level: Not on file  Occupational History    Employer: [employer]    Comment: Retired  Tobacco Use  . Smoking status: Current Every Day Smoker    Packs/day: 1.00    Years: 43.00    Pack years: 43.00    Types: Cigarettes  . Smokeless tobacco: Never Used  . Tobacco comment: using electronic cigs.-12 cigs a day as well  Vaping Use  . Vaping Use: Some days  . Substances: Nicotine  Substance and Sexual Activity  . Alcohol use: No    Alcohol/week: 0.0 standard drinks  . Drug use: No  . Sexual activity: Not on file  Other Topics Concern  . Not on file  Social History Narrative   Patient lives at home alone. Patient has a Data processing manager. Patient is divorced .   Patient is retired.   Education high school.   Right handed.   Caffeine none.   Social Determinants of Health   Financial Resource Strain: Not on file  Food Insecurity: Not on file  Transportation Needs: Not on file  Physical Activity: Not on file  Stress: Not on file  Social Connections: Not on file  Intimate Partner Violence: Not on file    Outpatient Medications  Prior to Visit  Medication Sig Dispense Refill  . albuterol (VENTOLIN HFA) 108 (90 Base) MCG/ACT inhaler Inhale into the lungs.    . ALPRAZolam (XANAX) 0.25 MG tablet TAKE 1 TABLET(0.25 MG) BY MOUTH THREE TIMES DAILY 90 tablet 2  . aspirin EC 325 MG tablet Take 325 mg by mouth daily.    Marland Kitchen atorvastatin (LIPITOR) 20 MG tablet Take 1 tablet (20 mg total) by mouth daily. 90 tablet 1  . chlorthalidone (HYGROTON) 25 MG tablet TAKE 1 TABLET(25 MG) BY MOUTH EVERY DAY 90 tablet 4  . cyclobenzaprine (FLEXERIL) 10 MG tablet Take by mouth.    . diclofenac Sodium (VOLTAREN) 1 % GEL diclofenac 1 % topical gel 100 g 0  . dicyclomine (BENTYL) 20 MG tablet dicyclomine 20 mg tablet  TK 1 T PO BID PRN    . Erenumab-aooe (AIMOVIG) 140 MG/ML SOAJ Aimovig Autoinjector 140 mg/mL subcutaneous auto-injector  Inject by subcutaneous route.    Marland Kitchen FLUoxetine (PROZAC) 20 MG capsule TAKE 2 CAPSULES BY MOUTH EVERY DAY 180 capsule 0  . meloxicam (MOBIC) 7.5 MG tablet Take 7.5 mg by mouth daily.    . montelukast (SINGULAIR) 10 MG tablet TAKE 1 TABLET(10 MG) BY MOUTH AT BEDTIME 90 tablet 2  . nystatin-triamcinolone (MYCOLOG II) cream nystatin-triamcinolone 100,000 unit/g-0.1 % topical cream    . ondansetron (ZOFRAN) 4 MG tablet TAKE 1 TABLET(4 MG) BY MOUTH EVERY 8 HOURS AS NEEDED FOR NAUSEA OR VOMITING 90 tablet 0  . potassium chloride (MICRO-K) 10 MEQ CR capsule TAKE 1 CAPSULE(10 MEQ) BY MOUTH EVERY DAY WITH FOOD 90 capsule 1  . promethazine (PHENERGAN) 25 MG tablet TAKE 1 TABLET BY MOUTH EVERY 8 HOURS IF NEEDED 20 tablet 0  . rizatriptan (MAXALT) 10 MG tablet Take 1 tablet (10 mg total) by mouth as needed for migraine. May repeat in 2 hours if needed 10 tablet 0  . traZODone (DESYREL) 50 MG tablet Take 1 tablet (50 mg total) by mouth at bedtime as needed for sleep. 30 tablet 3   No facility-administered medications prior to visit.    Allergies:   Erythromycin base and Penicillins   Social History   Tobacco Use  .  Smoking status: Current Every Day Smoker    Packs/day: 1.00    Years: 43.00    Pack years: 43.00    Types: Cigarettes  . Smokeless tobacco: Never Used  . Tobacco comment: using electronic cigs.-12 cigs a day as well  Vaping Use  . Vaping Use: Some days  . Substances: Nicotine  Substance Use Topics  . Alcohol use: No    Alcohol/week: 0.0 standard drinks  . Drug use: No  Review of Systems  Constitutional: Negative for fever and malaise/fatigue.  HENT: Positive for sore throat. Negative for congestion and sinus pain.        Rhinorrhea and post-nasal-drip  Respiratory: Negative for cough, sputum production, shortness of breath and wheezing.   Cardiovascular: Negative for chest pain and palpitations.  Gastrointestinal: Negative for abdominal pain, constipation, diarrhea, heartburn, nausea and vomiting.  Genitourinary: Negative for dysuria and urgency.  Musculoskeletal: Negative for joint pain and myalgias.  Neurological: Negative for dizziness and headaches.     Labs/Other Tests and Data Reviewed:    Recent Labs: 06/15/2019: TSH 2.470 02/15/2020: ALT 13; BUN 13; Creatinine, Ser 0.69; Hemoglobin 11.7; Platelets 411; Potassium 3.8; Sodium 135   Recent Lipid Panel Lab Results  Component Value Date/Time   CHOL 122 02/15/2020 10:40 AM   TRIG 135 02/15/2020 10:40 AM   HDL 46 02/15/2020 10:40 AM   CHOLHDL 2.7 02/15/2020 10:40 AM   CHOLHDL 4 01/24/2017 10:20 AM   LDLCALC 52 02/15/2020 10:40 AM   LDLDIRECT 151.9 09/13/2011 08:34 AM    Wt Readings from Last 3 Encounters:  02/23/20 124 lb (56.2 kg)  02/15/20 124 lb (56.2 kg)  01/12/20 124 lb (56.2 kg)     Objective:    Vital Signs:  Ht 5' (1.524 m)   Wt 124 lb (56.2 kg)   BMI 24.22 kg/m    Physical Exam No physical exam due to telemedicine visit  ASSESSMENT & PLAN:     1. Rhinopharyngitis - azithromycin (ZITHROMAX) 250 MG tablet; Take two tablets by mouth on day one, take one tablet by mouth on days two-five   Dispense: 6 tablet; Refill: 0   Rest and push fluids Replace toothbrushes and toothpaste Warm salt water gargles and throat lozenges as needed Continue Zyrtec and Singulair as prescribed Notify office if no improvement or symptoms worsen    COVID-19 Education: The signs and symptoms of COVID-19 were discussed with the patient and how to seek care for testing (follow up with PCP or arrange E-visit). The importance of social distancing was discussed today.   I spent < time > minutes dedicated to the care of this patient on the date of this encounter to include face-to-face time with the patient, as well as: EMR review and prescription medication management.   Follow Up:  In Person prn  Signed,  Jerrell Belfast, DNP  02/23/2020 2:47 PM    [place]

## 2020-03-07 ENCOUNTER — Other Ambulatory Visit: Payer: Self-pay | Admitting: Family Medicine

## 2020-03-29 ENCOUNTER — Encounter: Payer: Self-pay | Admitting: Nurse Practitioner

## 2020-03-29 ENCOUNTER — Telehealth (INDEPENDENT_AMBULATORY_CARE_PROVIDER_SITE_OTHER): Payer: Medicare PPO | Admitting: Nurse Practitioner

## 2020-03-29 VITALS — BP 140/76 | Ht 60.0 in | Wt 124.0 lb

## 2020-03-29 DIAGNOSIS — J0181 Other acute recurrent sinusitis: Secondary | ICD-10-CM | POA: Diagnosis not present

## 2020-03-29 DIAGNOSIS — J301 Allergic rhinitis due to pollen: Secondary | ICD-10-CM

## 2020-03-29 MED ORDER — AZITHROMYCIN 250 MG PO TABS: ORAL_TABLET | ORAL | 0 refills | Status: DC

## 2020-03-29 NOTE — Progress Notes (Signed)
Virtual Visit via Telephone Note   This visit type was conducted due to national recommendations for restrictions regarding the COVID-19 Pandemic (e.g. social distancing) in an effort to limit this patient's exposure and mitigate transmission in our community.  Due to her co-morbid illnesses, this patient is at least at moderate risk for complications without adequate follow up.  This format is felt to be most appropriate for this patient at this time.  The patient did not have access to video technology/had technical difficulties with video requiring transitioning to audio format only (telephone).  All issues noted in this document were discussed and addressed.  No physical exam could be performed with this format.  Patient verbally consented to a telehealth visit.   Date:  03/29/2020   ID:  Allison Jimenez, DOB 14-Jul-1950, MRN [id number]  Patient Location: Home Provider Location: Office/Clinic  PCP:  Rochel Brome, MD   Evaluation Performed:  Established patient, acute telemedicine visit  Chief Complaint:  Sore throat  History of Present Illness:    Allison Jimenez is a 70 y.o. female with sinus congestion, pain, and pressure. She is experiencing post-nasal-drip, sore throat, and non-productive cough. Onset of symptoms was 4-days ago. Treatment has included Singulair and Zyrtec. She does have a history of chronic allergic rhinitis and long-term cigarette smoker. She denies chest pain, dyspnea, or fever. States she took a home COVID-19 test yesterday that was negative.  The patient does have symptoms concerning for COVID-19 infection (fever, chills, cough, or new shortness of breath).    Past Medical History:  Diagnosis Date  . Allergy   . ANXIETY 06/05/2006  . Arthritis    SHOULDERS   . BACK PAIN 01/05/2010  . Cataract    bilateral, left worse  . Centrilobular emphysema ([place])   . Centrilobular emphysema ([place])   . COPD (chronic obstructive pulmonary disease) ([place])   . DIVERTICULOSIS,  COLON 06/05/2006  . DIZZINESS OR VERTIGO 06/05/2006   positional  . Hearing loss in left ear   . HYPERLIPIDEMIA 06/05/2006  . HYPOKALEMIA 12/14/2008  . MENIERE'S DISEASE 06/25/2006  . Migraine   . TOBACCO USER 11/25/2007    Past Surgical History:  Procedure Laterality Date  . APPENDECTOMY    . CESAREAN SECTION     x1  . CHOLECYSTECTOMY    . COLONOSCOPY    . COSMETIC SURGERY    . mastoid surgery  1992   shunt in mastoid- endolymphatic sac in left ear   . TUBAL LIGATION    . vocal cord surgery     nodule x2    Family History  Problem Relation Age of Onset  . Breast cancer Mother   . Migraines Other   . Depression Other   . Anxiety disorder Other   . High Cholesterol Other   . Colon cancer Neg Hx   . Rectal cancer Neg Hx   . Stomach cancer Neg Hx   . Colon polyps Neg Hx   . Esophageal cancer Neg Hx     Social History   Socioeconomic History  . Marital status: Divorced    Spouse name: Not on file  . Number of children: 1  . Years of education: 49  . Highest education level: Not on file  Occupational History    Employer: [employer]    Comment: Retired  Tobacco Use  . Smoking status: Current Every Day Smoker    Packs/day: 1.00    Years: 43.00    Pack years: 43.00  Types: Cigarettes  . Smokeless tobacco: Never Used  . Tobacco comment: using electronic cigs.-12 cigs a day as well  Vaping Use  . Vaping Use: Some days  . Substances: Nicotine  Substance and Sexual Activity  . Alcohol use: No    Alcohol/week: 0.0 standard drinks  . Drug use: No  . Sexual activity: Not on file  Other Topics Concern  . Not on file  Social History Narrative   Patient lives at home alone. Patient has a Data processing manager. Patient is divorced .   Patient is retired.   Education high school.   Right handed.   Caffeine none.   Social Determinants of Health   Financial Resource Strain: Not on file  Food Insecurity: Not on file  Transportation Needs: Not on file   Physical Activity: Not on file  Stress: Not on file  Social Connections: Not on file  Intimate Partner Violence: Not on file    Outpatient Medications Prior to Visit  Medication Sig Dispense Refill  . albuterol (VENTOLIN HFA) 108 (90 Base) MCG/ACT inhaler Inhale into the lungs.    . ALPRAZolam (XANAX) 0.25 MG tablet TAKE 1 TABLET(0.25 MG) BY MOUTH THREE TIMES DAILY 90 tablet 2  . aspirin EC 325 MG tablet Take 325 mg by mouth daily.    Marland Kitchen atorvastatin (LIPITOR) 20 MG tablet Take 1 tablet (20 mg total) by mouth daily. 90 tablet 1  . chlorthalidone (HYGROTON) 25 MG tablet TAKE 1 TABLET(25 MG) BY MOUTH EVERY DAY 90 tablet 4  . cyclobenzaprine (FLEXERIL) 10 MG tablet Take by mouth.    . diclofenac Sodium (VOLTAREN) 1 % GEL diclofenac 1 % topical gel 100 g 0  . dicyclomine (BENTYL) 20 MG tablet dicyclomine 20 mg tablet  TK 1 T PO BID PRN    . Erenumab-aooe (AIMOVIG) 140 MG/ML SOAJ Aimovig Autoinjector 140 mg/mL subcutaneous auto-injector  Inject by subcutaneous route.    Marland Kitchen FLUoxetine (PROZAC) 20 MG capsule TAKE 2 CAPSULES BY MOUTH EVERY DAY 180 capsule 0  . meloxicam (MOBIC) 7.5 MG tablet Take 7.5 mg by mouth daily.    . montelukast (SINGULAIR) 10 MG tablet TAKE 1 TABLET(10 MG) BY MOUTH AT BEDTIME 90 tablet 2  . nystatin-triamcinolone (MYCOLOG II) cream nystatin-triamcinolone 100,000 unit/g-0.1 % topical cream    . ondansetron (ZOFRAN) 4 MG tablet TAKE 1 TABLET(4 MG) BY MOUTH EVERY 8 HOURS AS NEEDED FOR NAUSEA OR VOMITING 90 tablet 0  . potassium chloride (MICRO-K) 10 MEQ CR capsule TAKE 1 CAPSULE(10 MEQ) BY MOUTH EVERY DAY WITH FOOD 90 capsule 1  . promethazine (PHENERGAN) 25 MG tablet TAKE 1 TABLET BY MOUTH EVERY 8 HOURS IF NEEDED 20 tablet 0  . rizatriptan (MAXALT) 10 MG tablet Take 1 tablet (10 mg total) by mouth as needed for migraine. May repeat in 2 hours if needed 10 tablet 0  . traZODone (DESYREL) 50 MG tablet Take 1 tablet (50 mg total) by mouth at bedtime as needed for sleep. 30  tablet 3  . azithromycin (ZITHROMAX) 250 MG tablet Take two tablets by mouth on day one, take one tablet by mouth on days two-five 6 tablet 0   No facility-administered medications prior to visit.    Allergies:   Erythromycin base and Penicillins   Social History   Tobacco Use  . Smoking status: Current Every Day Smoker    Packs/day: 1.00    Years: 43.00    Pack years: 43.00    Types: Cigarettes  . Smokeless tobacco: Never  Used  . Tobacco comment: using electronic cigs.-12 cigs a day as well  Vaping Use  . Vaping Use: Some days  . Substances: Nicotine  Substance Use Topics  . Alcohol use: No    Alcohol/week: 0.0 standard drinks  . Drug use: No     Review of Systems  Constitutional: Positive for malaise/fatigue. Negative for fever.  HENT: Positive for congestion, sinus pain and sore throat.   Skin: Negative for rash.  Neurological: Positive for headaches.     Labs/Other Tests and Data Reviewed:    Recent Labs: 06/15/2019: TSH 2.470 02/15/2020: ALT 13; BUN 13; Creatinine, Ser 0.69; Hemoglobin 11.7; Platelets 411; Potassium 3.8; Sodium 135   Recent Lipid Panel Lab Results  Component Value Date/Time   CHOL 122 02/15/2020 10:40 AM   TRIG 135 02/15/2020 10:40 AM   HDL 46 02/15/2020 10:40 AM   CHOLHDL 2.7 02/15/2020 10:40 AM   CHOLHDL 4 01/24/2017 10:20 AM   LDLCALC 52 02/15/2020 10:40 AM   LDLDIRECT 151.9 09/13/2011 08:34 AM    Wt Readings from Last 3 Encounters:  02/23/20 124 lb (56.2 kg)  02/15/20 124 lb (56.2 kg)  01/12/20 124 lb (56.2 kg)     Objective:    Vital Signs:  BP 140/76   Ht 5' (1.524 m)   Wt 124 lb (56.2 kg)   BMI 24.22 kg/m    Physical Exam No physical exam performed due to telemedicine visit  ASSESSMENT & PLAN:    1. Seasonal allergic rhinitis due to pollen  2. Other acute recurrent sinusitis - azithromycin (ZITHROMAX) 250 MG tablet; Take two tablets by mouth on day one, take one tablet by mouth on days two-five  Dispense: 6 tablet;  Refill: 0    Continue Zyrtec and Singulair May use OTC allergy nasal spray as directed for seasonal allergies Warm salt water gargles for sore throat Replace toothbrushes and toothpaste Follow-up as needed if no improvement or symptoms worsen Seek emergency medical care if symptoms are severe  COVID-19 Education: The signs and symptoms of COVID-19 were discussed with the patient and how to seek care for testing (follow up with PCP or arrange E-visit). The importance of social distancing was discussed today.   I spent 15 minutes dedicated to the care of this patient on the date of this encounter to include telephone time with the patient, as well as:EMR review and prescription medication management.  Follow Up:  In Person prn  Signed,  Rip Harbour, NP  03/29/2020 3:20 PM    [place]

## 2020-03-31 ENCOUNTER — Telehealth: Payer: Self-pay | Admitting: Acute Care

## 2020-04-01 NOTE — Telephone Encounter (Signed)
Will forward to [facility] ) to contact pt to schedule CT.

## 2020-04-04 ENCOUNTER — Other Ambulatory Visit: Payer: Self-pay | Admitting: Family Medicine

## 2020-04-04 DIAGNOSIS — H3561 Retinal hemorrhage, right eye: Secondary | ICD-10-CM | POA: Diagnosis not present

## 2020-04-04 NOTE — Telephone Encounter (Signed)
I have spoke with Allison Jimenez and her LCS CT has been scheduled on 04/14/20 @2 :00pm at [facility] CT and she is aware of the appt and location

## 2020-04-11 ENCOUNTER — Ambulatory Visit: Payer: Medicare PPO

## 2020-04-14 ENCOUNTER — Inpatient Hospital Stay: Admission: RE | Admit: 2020-04-14 | Payer: Medicare PPO | Source: Ambulatory Visit

## 2020-04-14 ENCOUNTER — Other Ambulatory Visit: Payer: Self-pay | Admitting: Family Medicine

## 2020-04-25 ENCOUNTER — Other Ambulatory Visit: Payer: Self-pay

## 2020-04-25 MED ORDER — PROMETHAZINE HCL 25 MG PO TABS: ORAL_TABLET | ORAL | 0 refills | Status: DC

## 2020-04-26 ENCOUNTER — Other Ambulatory Visit: Payer: Self-pay | Admitting: Nurse Practitioner

## 2020-04-26 MED ORDER — PROMETHAZINE HCL 25 MG PO TABS: ORAL_TABLET | ORAL | 0 refills | Status: DC

## 2020-04-28 ENCOUNTER — Inpatient Hospital Stay: Admission: RE | Admit: 2020-04-28 | Payer: Medicare PPO | Source: Ambulatory Visit

## 2020-05-05 ENCOUNTER — Ambulatory Visit (INDEPENDENT_AMBULATORY_CARE_PROVIDER_SITE_OTHER)
Admission: RE | Admit: 2020-05-05 | Discharge: 2020-05-05 | Disposition: A | Discharge status: Home / Self Care | Payer: Medicare PPO | Source: Ambulatory Visit | Attending: Acute Care | Admitting: Acute Care

## 2020-05-05 ENCOUNTER — Other Ambulatory Visit: Payer: Self-pay

## 2020-05-05 DIAGNOSIS — F17210 Nicotine dependence, cigarettes, uncomplicated: Secondary | ICD-10-CM | POA: Diagnosis not present

## 2020-05-05 DIAGNOSIS — Z87891 Personal history of nicotine dependence: Secondary | ICD-10-CM

## 2020-05-12 ENCOUNTER — Encounter: Payer: Self-pay | Admitting: *Deleted

## 2020-05-12 ENCOUNTER — Other Ambulatory Visit: Payer: Self-pay | Admitting: *Deleted

## 2020-05-12 DIAGNOSIS — F17210 Nicotine dependence, cigarettes, uncomplicated: Secondary | ICD-10-CM

## 2020-05-12 DIAGNOSIS — Z87891 Personal history of nicotine dependence: Secondary | ICD-10-CM

## 2020-05-12 NOTE — Progress Notes (Signed)
Please call patient and let them  know their  low dose Ct was read as a Lung RADS 2: nodules that are benign in appearance and behavior with a very low likelihood of becoming a clinically active cancer due to size or lack of growth. Recommendation per radiology is for a repeat LDCT in 12 months. .Please let them  know we will order and schedule their  annual screening scan for 05/2021. Please let them  know there was notation of CAD on their  scan.  Please remind the patient  that this is a non-gated exam therefore degree or severity of disease  cannot be determined. Please have them  follow up with their PCP regarding potential risk factor modification, dietary therapy or pharmacologic therapy if clinically indicated. Pt.  is  currently on statin therapy. Please place order for annual  screening scan for  05/2021 and fax results to PCP. Thanks so much. 

## 2020-05-18 ENCOUNTER — Other Ambulatory Visit: Payer: Self-pay | Admitting: Physician Assistant

## 2020-06-02 ENCOUNTER — Other Ambulatory Visit: Payer: Self-pay | Admitting: Family Medicine

## 2020-06-02 ENCOUNTER — Other Ambulatory Visit: Payer: Self-pay | Admitting: Physician Assistant

## 2020-06-02 ENCOUNTER — Encounter: Payer: Self-pay | Admitting: Legal Medicine

## 2020-06-02 ENCOUNTER — Ambulatory Visit: Payer: Medicare PPO | Admitting: Legal Medicine

## 2020-06-02 VITALS — BP 122/68 | HR 85 | Temp 97.3°F | Ht 61.5 in | Wt 125.0 lb

## 2020-06-02 DIAGNOSIS — J028 Acute pharyngitis due to other specified organisms: Secondary | ICD-10-CM | POA: Diagnosis not present

## 2020-06-02 DIAGNOSIS — Z9189 Other specified personal risk factors, not elsewhere classified: Secondary | ICD-10-CM

## 2020-06-02 DIAGNOSIS — J0181 Other acute recurrent sinusitis: Secondary | ICD-10-CM | POA: Diagnosis not present

## 2020-06-02 LAB — POCT RAPID STREP A (OFFICE): Rapid Strep A Screen: NEGATIVE

## 2020-06-02 LAB — POC COVID19 BINAXNOW: SARS Coronavirus 2 Ag: NEGATIVE

## 2020-06-02 MED ORDER — DOXYCYCLINE HYCLATE 100 MG PO TABS: 100.0000 mg | ORAL_TABLET | Freq: Two times a day (BID) | ORAL | 0 refills | Status: DC

## 2020-06-02 NOTE — Telephone Encounter (Signed)
Your pt

## 2020-06-02 NOTE — Progress Notes (Signed)
Acute Office Visit  Subjective:    Patient ID: Allison Jimenez, female    DOB: [birth year], 70 y.o.   MRN: [id number]  Chief Complaint  Patient presents with  . Sinusitis    HPI Patient is in today for sinus sxs states these sxs have been going on for 3 weeks. C/o HA/cough/sneezing/runny nose. Has been taking otc allergy medications which have not helped. No fever or chills.  Negative covid test.  Past Medical History:  Diagnosis Date  . Allergy   . ANXIETY 06/05/2006  . Arthritis    SHOULDERS   . BACK PAIN 01/05/2010  . Cataract    bilateral, left worse  . Centrilobular emphysema ([place])   . Centrilobular emphysema ([place])   . COPD (chronic obstructive pulmonary disease) ([place])   . DIVERTICULOSIS, COLON 06/05/2006  . DIZZINESS OR VERTIGO 06/05/2006   positional  . Hearing loss in left ear   . HYPERLIPIDEMIA 06/05/2006  . HYPOKALEMIA 12/14/2008  . MENIERE'S DISEASE 06/25/2006  . Migraine   . TOBACCO USER 11/25/2007    Past Surgical History:  Procedure Laterality Date  . APPENDECTOMY    . CESAREAN SECTION     x1  . CHOLECYSTECTOMY    . COLONOSCOPY    . COSMETIC SURGERY    . mastoid surgery  1992   shunt in mastoid- endolymphatic sac in left ear   . TUBAL LIGATION    . vocal cord surgery     nodule x2    Family History  Problem Relation Age of Onset  . Breast cancer Mother   . Migraines Other   . Depression Other   . Anxiety disorder Other   . High Cholesterol Other   . Colon cancer Neg Hx   . Rectal cancer Neg Hx   . Stomach cancer Neg Hx   . Colon polyps Neg Hx   . Esophageal cancer Neg Hx     Social History   Socioeconomic History  . Marital status: Divorced    Spouse name: Not on file  . Number of children: 1  . Years of education: 24  . Highest education level: Not on file  Occupational History    Employer: [employer]    Comment: Retired  Tobacco Use  . Smoking status: Current Every Day Smoker    Packs/day: 1.00    Years: 43.00    Pack  years: 43.00    Types: Cigarettes  . Smokeless tobacco: Never Used  . Tobacco comment: using electronic cigs.-12 cigs a day as well  Vaping Use  . Vaping Use: Some days  . Substances: Nicotine  Substance and Sexual Activity  . Alcohol use: No    Alcohol/week: 0.0 standard drinks  . Drug use: No  . Sexual activity: Not on file  Other Topics Concern  . Not on file  Social History Narrative   Patient lives at home alone. Patient has a Data processing manager. Patient is divorced .   Patient is retired.   Education high school.   Right handed.   Caffeine none.   Social Determinants of Health   Financial Resource Strain: Not on file  Food Insecurity: Not on file  Transportation Needs: Not on file  Physical Activity: Not on file  Stress: Not on file  Social Connections: Not on file  Intimate Partner Violence: Not on file    Outpatient Medications Prior to Visit  Medication Sig Dispense Refill  . albuterol (VENTOLIN HFA) 108 (90 Base) MCG/ACT inhaler Inhale  into the lungs.    . ALPRAZolam (XANAX) 0.25 MG tablet TAKE 1 TABLET(0.25 MG) BY MOUTH THREE TIMES DAILY 90 tablet 2  . aspirin EC 325 MG tablet Take 325 mg by mouth daily.    Marland Kitchen atorvastatin (LIPITOR) 20 MG tablet TAKE 1 TABLET(20 MG) BY MOUTH DAILY 90 tablet 1  . azithromycin (ZITHROMAX) 250 MG tablet Take two tablets by mouth on day one, take one tablet by mouth on days two-five 6 tablet 0  . chlorthalidone (HYGROTON) 25 MG tablet TAKE 1 TABLET(25 MG) BY MOUTH EVERY DAY 90 tablet 4  . cyclobenzaprine (FLEXERIL) 10 MG tablet Take by mouth.    . diclofenac Sodium (VOLTAREN) 1 % GEL diclofenac 1 % topical gel 100 g 0  . dicyclomine (BENTYL) 20 MG tablet dicyclomine 20 mg tablet  TK 1 T PO BID PRN    . Erenumab-aooe (AIMOVIG) 140 MG/ML SOAJ Aimovig Autoinjector 140 mg/mL subcutaneous auto-injector  Inject by subcutaneous route.    Marland Kitchen FLUoxetine (PROZAC) 20 MG capsule TAKE 2 CAPSULES BY MOUTH EVERY DAY 180 capsule 0  . meloxicam  (MOBIC) 7.5 MG tablet Take 7.5 mg by mouth daily.    . montelukast (SINGULAIR) 10 MG tablet TAKE 1 TABLET(10 MG) BY MOUTH AT BEDTIME 90 tablet 2  . nystatin-triamcinolone (MYCOLOG II) cream nystatin-triamcinolone 100,000 unit/g-0.1 % topical cream    . ondansetron (ZOFRAN) 4 MG tablet TAKE 1 TABLET(4 MG) BY MOUTH EVERY 8 HOURS AS NEEDED FOR NAUSEA OR VOMITING 90 tablet 0  . potassium chloride (MICRO-K) 10 MEQ CR capsule TAKE 1 CAPSULE(10 MEQ) BY MOUTH EVERY DAY WITH FOOD 90 capsule 0  . promethazine (PHENERGAN) 25 MG tablet TAKE 1 TABLET BY MOUTH EVERY 8 HOURS IF NEEDED 20 tablet 0  . rizatriptan (MAXALT) 10 MG tablet Take 1 tablet (10 mg total) by mouth as needed for migraine. May repeat in 2 hours if needed 10 tablet 0  . traZODone (DESYREL) 50 MG tablet Take 1 tablet (50 mg total) by mouth at bedtime as needed for sleep. 30 tablet 3   No facility-administered medications prior to visit.    Allergies  Allergen Reactions  . Erythromycin Base Diarrhea  . Penicillins     Review of Systems  Constitutional: Negative for chills, fatigue and fever.  HENT: Positive for congestion, postnasal drip, rhinorrhea and sore throat. Negative for ear pain, sinus pressure and sinus pain.   Respiratory: Positive for cough. Negative for shortness of breath.   Cardiovascular: Negative for chest pain.  Gastrointestinal: Negative for diarrhea and nausea.  Genitourinary: Negative for difficulty urinating and dysuria.  Musculoskeletal: Negative.   Neurological: Positive for headaches. Negative for dizziness.       Objective:    Physical Exam Vitals reviewed.  Constitutional:      Appearance: Normal appearance.  HENT:     Right Ear: Tympanic membrane, ear canal and external ear normal.     Left Ear: Tympanic membrane, ear canal and external ear normal.     Mouth/Throat:     Mouth: Mucous membranes are moist.     Pharynx: Posterior oropharyngeal erythema present.  Eyes:     Extraocular Movements:  Extraocular movements intact.     Conjunctiva/sclera: Conjunctivae normal.     Pupils: Pupils are equal, round, and reactive to light.  Cardiovascular:     Rate and Rhythm: Normal rate and regular rhythm.     Pulses: Normal pulses.     Heart sounds: Normal heart sounds. No murmur heard. No gallop.  Pulmonary:     Effort: Pulmonary effort is normal. No respiratory distress.     Breath sounds: Normal breath sounds. No rales.  Neurological:     Mental Status: She is alert.     BP 122/68   Pulse 85   Temp (!) 97.3 F (36.3 C)   Ht 5' 1.5" (1.562 m)   Wt 125 lb (56.7 kg)   SpO2 95%   BMI 23.24 kg/m  Wt Readings from Last 3 Encounters:  06/02/20 125 lb (56.7 kg)  03/29/20 124 lb (56.2 kg)  02/23/20 124 lb (56.2 kg)    Health Maintenance Due  Topic Date Due  . Zoster Vaccines- Shingrix (2 of 2) 06/22/2016  . Pneumococcal Vaccine 60-84 Years old (1 of 2 - PPSV23) Never done  . TETANUS/TDAP  05/17/2020    There are no preventive care reminders to display for this patient.   Lab Results  Component Value Date   TSH 2.470 06/15/2019   Lab Results  Component Value Date   WBC 7.2 02/15/2020   HGB 11.7 02/15/2020   HCT 37.0 02/15/2020   MCV 82 02/15/2020   PLT 411 02/15/2020   Lab Results  Component Value Date   NA 135 02/15/2020   K 3.8 02/15/2020   CO2 25 02/15/2020   GLUCOSE 106 (H) 02/15/2020   BUN 13 02/15/2020   CREATININE 0.69 02/15/2020   BILITOT 0.2 02/15/2020   ALKPHOS 63 02/15/2020   AST 19 02/15/2020   ALT 13 02/15/2020   PROT 6.7 02/15/2020   ALBUMIN 4.3 02/15/2020   CALCIUM 9.8 02/15/2020   GFR 75.48 07/09/2016   Lab Results  Component Value Date   CHOL 122 02/15/2020   Lab Results  Component Value Date   HDL 46 02/15/2020   Lab Results  Component Value Date   LDLCALC 52 02/15/2020   Lab Results  Component Value Date   TRIG 135 02/15/2020   Lab Results  Component Value Date   CHOLHDL 2.7 02/15/2020   Lab Results  Component  Value Date   HGBA1C 6.5 (H) 06/18/2006       Assessment & Plan:  Diagnoses and all orders for this visit: Other acute recurrent sinusitis -     doxycycline (VIBRA-TABS) 100 MG tablet; Take 1 tablet (100 mg total) by mouth 2 (two) times daily. Acute sinusitis treat with doxycycline  At increased risk of exposure to COVID-19 virus -     POC COVID-19 Negative COVID test  Pharyngitis due to other organism -     Rapid Strep A Strep test negative        I spent 15 minutes dedicated to the care of this patient on the date of this encounter to include face-to-face time with the patient, as well as:   Follow-up: Return if symptoms worsen or fail to improve.  An After Visit Summary was printed and given to the patient.  Reinaldo Meeker, MD [facility] [phone]

## 2020-06-03 ENCOUNTER — Other Ambulatory Visit: Payer: Self-pay | Admitting: Legal Medicine

## 2020-06-03 DIAGNOSIS — Z122 Encounter for screening for malignant neoplasm of respiratory organs: Secondary | ICD-10-CM

## 2020-06-03 MED ORDER — FLUOXETINE HCL 20 MG PO CAPS: 40.0000 mg | ORAL_CAPSULE | Freq: Every day | ORAL | 1 refills | Status: DC

## 2020-06-14 ENCOUNTER — Ambulatory Visit: Payer: Medicare PPO | Admitting: Family Medicine

## 2020-06-24 DIAGNOSIS — H31002 Unspecified chorioretinal scars, left eye: Secondary | ICD-10-CM | POA: Diagnosis not present

## 2020-06-24 DIAGNOSIS — G43909 Migraine, unspecified, not intractable, without status migrainosus: Secondary | ICD-10-CM | POA: Diagnosis not present

## 2020-06-28 DIAGNOSIS — M542 Cervicalgia: Secondary | ICD-10-CM | POA: Diagnosis not present

## 2020-06-28 DIAGNOSIS — W19XXXA Unspecified fall, initial encounter: Secondary | ICD-10-CM | POA: Diagnosis not present

## 2020-06-28 DIAGNOSIS — M545 Low back pain, unspecified: Secondary | ICD-10-CM | POA: Diagnosis not present

## 2020-06-28 DIAGNOSIS — M5136 Other intervertebral disc degeneration, lumbar region: Secondary | ICD-10-CM | POA: Diagnosis not present

## 2020-06-28 DIAGNOSIS — M546 Pain in thoracic spine: Secondary | ICD-10-CM | POA: Diagnosis not present

## 2020-06-29 DIAGNOSIS — G8929 Other chronic pain: Secondary | ICD-10-CM | POA: Diagnosis not present

## 2020-06-29 DIAGNOSIS — R519 Headache, unspecified: Secondary | ICD-10-CM | POA: Diagnosis not present

## 2020-06-29 DIAGNOSIS — H539 Unspecified visual disturbance: Secondary | ICD-10-CM | POA: Diagnosis not present

## 2020-07-01 DIAGNOSIS — R531 Weakness: Secondary | ICD-10-CM | POA: Diagnosis not present

## 2020-07-01 DIAGNOSIS — M542 Cervicalgia: Secondary | ICD-10-CM | POA: Diagnosis not present

## 2020-07-01 DIAGNOSIS — M2560 Stiffness of unspecified joint, not elsewhere classified: Secondary | ICD-10-CM | POA: Diagnosis not present

## 2020-07-01 DIAGNOSIS — M545 Low back pain, unspecified: Secondary | ICD-10-CM | POA: Diagnosis not present

## 2020-07-01 DIAGNOSIS — M25552 Pain in left hip: Secondary | ICD-10-CM | POA: Diagnosis not present

## 2020-07-05 ENCOUNTER — Telehealth: Payer: Self-pay

## 2020-07-05 ENCOUNTER — Ambulatory Visit: Payer: Medicare PPO | Admitting: Family Medicine

## 2020-07-05 VITALS — BP 116/76 | HR 116 | Temp 97.3°F | Resp 18 | Ht 60.0 in | Wt 123.2 lb

## 2020-07-05 DIAGNOSIS — E039 Hypothyroidism, unspecified: Secondary | ICD-10-CM

## 2020-07-05 DIAGNOSIS — J029 Acute pharyngitis, unspecified: Secondary | ICD-10-CM | POA: Diagnosis not present

## 2020-07-05 DIAGNOSIS — E785 Hyperlipidemia, unspecified: Secondary | ICD-10-CM | POA: Diagnosis not present

## 2020-07-05 DIAGNOSIS — F330 Major depressive disorder, recurrent, mild: Secondary | ICD-10-CM | POA: Diagnosis not present

## 2020-07-05 DIAGNOSIS — R7303 Prediabetes: Secondary | ICD-10-CM | POA: Diagnosis not present

## 2020-07-05 DIAGNOSIS — I10 Essential (primary) hypertension: Secondary | ICD-10-CM | POA: Diagnosis not present

## 2020-07-05 DIAGNOSIS — G43801 Other migraine, not intractable, with status migrainosus: Secondary | ICD-10-CM | POA: Diagnosis not present

## 2020-07-05 DIAGNOSIS — J018 Other acute sinusitis: Secondary | ICD-10-CM

## 2020-07-05 LAB — POCT RAPID STREP A: Strep A Ag: NOT DETECTED

## 2020-07-05 LAB — POC COVID19 BINAXNOW: SARS Coronavirus 2 Ag: NEGATIVE

## 2020-07-05 MED ORDER — AZITHROMYCIN 250 MG PO TABS: ORAL_TABLET | ORAL | 0 refills | Status: DC

## 2020-07-05 NOTE — Telephone Encounter (Signed)
Patient calls today to report an increase in her headaches. She says she has had them for years, but over the past month or so - they have drastically increased in frequency.Says she sometimes has multiple a day and they last for 15 to 20 minutes at a time. Describes "flashers" in her eye as a little white light that acts as an aura for her headaches. The headaches have not increased in severity and she denies having sudden, severe, or different pain. She wonders if these are due to her carotid artery blockages. Asked Dr. Stanford Breed - it is unlikely they are related. Patient verbalizes understanding and will keep appt at the end of the month.

## 2020-07-05 NOTE — Progress Notes (Signed)
Subjective:  Patient ID: Allison Jimenez, female    DOB: 08-27-1950  Age: 70 y.o. MRN: [id number]  Chief Complaint  Patient presents with   Hyperlipidemia   Headache    HPI Pt was seen on June 3rd here by Dr. Henrene Pastor for sinusitis and was treated doxycycline. Pt has a lot of drainage. Dry throat and coughing.  Patient has been have flashes, not floaters increasing in left eye since early June. Pt has had migraines following flashers numerous times over the last month. She has kept excellent migraine logs. She called eye doctor and was seen on 6/24. Retinas were fine. Visual acuity 20/20. She saw Teodora Medici, NP with neurology in [place] and she was given baclofen, which did not help, and started on zonisamide. She is supposed to call back to neurology today.   Patient is also due for lipids and a1c. She has hyperlipidemia (atorvastatin.) She is prediabetes and hypertension. On chlorthalidone.    Current Outpatient Medications on File Prior to Visit  Medication Sig Dispense Refill   albuterol (VENTOLIN HFA) 108 (90 Base) MCG/ACT inhaler Inhale into the lungs.     ALPRAZolam (XANAX) 0.25 MG tablet TAKE 1 TABLET(0.25 MG) BY MOUTH THREE TIMES DAILY 90 tablet 2   aspirin EC 325 MG tablet Take 325 mg by mouth daily.     atorvastatin (LIPITOR) 20 MG tablet TAKE 1 TABLET(20 MG) BY MOUTH DAILY 90 tablet 1   chlorthalidone (HYGROTON) 25 MG tablet TAKE 1 TABLET(25 MG) BY MOUTH EVERY DAY 90 tablet 4   cyclobenzaprine (FLEXERIL) 10 MG tablet Take by mouth.     diclofenac Sodium (VOLTAREN) 1 % GEL diclofenac 1 % topical gel 100 g 0   dicyclomine (BENTYL) 20 MG tablet dicyclomine 20 mg tablet  TK 1 T PO BID PRN     Erenumab-aooe (AIMOVIG) 140 MG/ML SOAJ Aimovig Autoinjector 140 mg/mL subcutaneous auto-injector  Inject by subcutaneous route.     FLUoxetine (PROZAC) 20 MG capsule Take 2 capsules (40 mg total) by mouth daily. 180 capsule 1   meloxicam (MOBIC) 7.5 MG tablet Take 7.5 mg by mouth daily.      montelukast (SINGULAIR) 10 MG tablet TAKE 1 TABLET(10 MG) BY MOUTH AT BEDTIME 90 tablet 2   nystatin-triamcinolone (MYCOLOG II) cream nystatin-triamcinolone 100,000 unit/g-0.1 % topical cream     ondansetron (ZOFRAN) 4 MG tablet TAKE 1 TABLET(4 MG) BY MOUTH EVERY 8 HOURS AS NEEDED FOR NAUSEA OR VOMITING 90 tablet 0   potassium chloride (MICRO-K) 10 MEQ CR capsule TAKE 1 CAPSULE(10 MEQ) BY MOUTH EVERY DAY WITH FOOD 90 capsule 0   promethazine (PHENERGAN) 25 MG tablet TAKE 1 TABLET BY MOUTH EVERY 8 HOURS IF NEEDED 20 tablet 0   rizatriptan (MAXALT) 10 MG tablet Take 1 tablet (10 mg total) by mouth as needed for migraine. May repeat in 2 hours if needed 10 tablet 0   traZODone (DESYREL) 50 MG tablet Take 1 tablet (50 mg total) by mouth at bedtime as needed for sleep. 30 tablet 3   No current facility-administered medications on file prior to visit.   Past Medical History:  Diagnosis Date   Allergy    ANXIETY 06/05/2006   Arthritis    SHOULDERS    BACK PAIN 01/05/2010   Cataract    bilateral, left worse   Centrilobular emphysema ([facility])    Centrilobular emphysema ([facility])    COPD (chronic obstructive pulmonary disease) ([place])    DIVERTICULOSIS, COLON 06/05/2006   DIZZINESS OR VERTIGO 06/05/2006  positional   Hearing loss in left ear    HYPERLIPIDEMIA 06/05/2006   HYPOKALEMIA 12/14/2008   MENIERE'S DISEASE 06/25/2006   Migraine    TOBACCO USER 11/25/2007   Past Surgical History:  Procedure Laterality Date   APPENDECTOMY     CESAREAN SECTION     x1   CHOLECYSTECTOMY     COLONOSCOPY     COSMETIC SURGERY     mastoid surgery  1992   shunt in mastoid- endolymphatic sac in left ear    TUBAL LIGATION     vocal cord surgery     nodule x2    Family History  Problem Relation Age of Onset   Breast cancer Mother    Migraines Other    Depression Other    Anxiety disorder Other    High Cholesterol Other    Colon cancer Neg Hx    Rectal cancer Neg Hx    Stomach cancer Neg Hx    Colon polyps Neg  Hx    Esophageal cancer Neg Hx    Social History   Socioeconomic History   Marital status: Divorced    Spouse name: Not on file   Number of children: 1   Years of education: 13   Highest education level: Not on file  Occupational History    Employer: [employer]    Comment: Retired  Tobacco Use   Smoking status: Every Day    Packs/day: 1.00    Years: 43.00    Pack years: 43.00    Types: Cigarettes   Smokeless tobacco: Never   Tobacco comments:    using electronic cigs.-12 cigs a day as well  Vaping Use   Vaping Use: Some days   Substances: Nicotine  Substance and Sexual Activity   Alcohol use: No    Alcohol/week: 0.0 standard drinks   Drug use: No   Sexual activity: Not on file  Other Topics Concern   Not on file  Social History Narrative   Patient lives at home alone. Patient has a Data processing manager. Patient is divorced .   Patient is retired.   Education high school.   Right handed.   Caffeine none.   Social Determinants of Health   Financial Resource Strain: Not on file  Food Insecurity: Not on file  Transportation Needs: Not on file  Physical Activity: Not on file  Stress: Not on file  Social Connections: Not on file    Review of Systems  Constitutional:  Negative for chills, fatigue and fever.  HENT:  Positive for congestion and sore throat. Negative for rhinorrhea.   Eyes:  Positive for visual disturbance.  Respiratory:  Positive for cough. Negative for shortness of breath.   Cardiovascular:  Negative for chest pain and palpitations.  Gastrointestinal:  Negative for abdominal pain, constipation, diarrhea, nausea and vomiting.  Genitourinary:  Negative for dysuria and urgency.  Musculoskeletal:  Positive for back pain. Negative for myalgias.  Neurological:  Positive for headaches. Negative for dizziness, weakness and light-headedness.  Psychiatric/Behavioral:  Negative for dysphoric mood. The patient is nervous/anxious.      Objective:  BP 116/76   Pulse (!) 116   Temp (!) 97.3 F (36.3 C)   Resp 18   Ht 5' (1.524 m)   Wt 123 lb 3.2 oz (55.9 kg)   BMI 24.06 kg/m   BP/Weight 07/05/2020 06/02/2020 05/19/8414  Systolic BP 606 301 601  Diastolic BP 76 68 76  Wt. (Lbs) 123.2 125 124  BMI 24.06  23.24 24.22    Physical Exam Vitals reviewed.  Constitutional:      Appearance: Normal appearance. She is normal weight.  HENT:     Right Ear: Tympanic membrane, ear canal and external ear normal.     Left Ear: Tympanic membrane, ear canal and external ear normal.     Nose: Congestion present.     Comments: Sinusitis tenderness    Mouth/Throat:     Pharynx: Oropharynx is clear.  Neck:     Vascular: No carotid bruit.  Cardiovascular:     Rate and Rhythm: Normal rate and regular rhythm.     Pulses: Normal pulses.     Heart sounds: Normal heart sounds. No murmur heard. Pulmonary:     Effort: Pulmonary effort is normal. No respiratory distress.     Breath sounds: Normal breath sounds.  Abdominal:     General: Abdomen is flat. Bowel sounds are normal.     Palpations: Abdomen is soft.     Tenderness: There is no abdominal tenderness.  Neurological:     Mental Status: She is alert and oriented to person, place, and time.  Psychiatric:        Mood and Affect: Mood normal.        Behavior: Behavior normal.    Diabetic Foot Exam - Simple   No data filed      Lab Results  Component Value Date   WBC 8.9 07/05/2020   HGB 11.8 07/05/2020   HCT 37.5 07/05/2020   PLT 374 07/05/2020   GLUCOSE 101 (H) 07/05/2020   CHOL 129 07/05/2020   TRIG 132 07/05/2020   HDL 48 07/05/2020   LDLDIRECT 151.9 09/13/2011   LDLCALC 58 07/05/2020   ALT 11 07/05/2020   AST 17 07/05/2020   NA 130 (L) 07/05/2020   K 3.9 07/05/2020   CL 90 (L) 07/05/2020   CREATININE 0.64 07/05/2020   BUN 9 07/05/2020   CO2 29 07/05/2020   TSH 9.910 (H) 07/05/2020   HGBA1C 6.0 (H) 07/05/2020      Assessment & Plan:   1. Sore  throat - POCT Rapid Strep A negative - POC COVID-19 BinaxNow negative  2. Dyslipidemia - CBC with Differential/Platelet - Comprehensive metabolic panel - Lipid panel - TSH  3. Major depressive disorder, recurrent episode, mild (HCC) The current medical regimen is effective;  continue present plan and medications.   4. Ocular migraine with status migrainosus - Sedimentation rate (came back negative.) Mgmt by neurology  5. Prediabetes - Hemoglobin A1c  6. Acquired hypothyroidism New dx. Started on synthroid 25 mcg once daily. Recheck in 6 weeks.   7. Essential hypertension The current medical regimen is effective;  continue present plan and medications.  8. Acute non-recurrent sinusitis of other sinus Zpack rx given.   Meds ordered this encounter  Medications   azithromycin (ZITHROMAX) 250 MG tablet    Sig: 2 pills first day, 1 pill daily x 4 days.    Dispense:  6 each    Refill:  0    Orders Placed This Encounter  Procedures   CBC with Differential/Platelet   Comprehensive metabolic panel   Hemoglobin A1c   Lipid panel   TSH   Sedimentation rate   Cardiovascular Risk Assessment   POC COVID-19 BinaxNow     Follow-up: Return in about 3 months (around 10/05/2020) for fasting.  An After Visit Summary was printed and given to the patient.  Rochel Brome, MD [facility] [phone]

## 2020-07-06 LAB — CBC WITH DIFFERENTIAL/PLATELET
Basophils Absolute: 0.1 10*3/uL (ref 0.0–0.2)
Basos: 1 %
EOS (ABSOLUTE): 0.5 10*3/uL — ABNORMAL HIGH (ref 0.0–0.4)
Eos: 5 %
Hematocrit: 37.5 % (ref 34.0–46.6)
Hemoglobin: 11.8 g/dL (ref 11.1–15.9)
Immature Grans (Abs): 0 10*3/uL (ref 0.0–0.1)
Immature Granulocytes: 0 %
Lymphocytes Absolute: 2.3 10*3/uL (ref 0.7–3.1)
Lymphs: 25 %
MCH: 27.8 pg (ref 26.6–33.0)
MCHC: 31.5 g/dL (ref 31.5–35.7)
MCV: 88 fL (ref 79–97)
Monocytes Absolute: 0.8 10*3/uL (ref 0.1–0.9)
Monocytes: 9 %
Neutrophils Absolute: 5.3 10*3/uL (ref 1.4–7.0)
Neutrophils: 60 %
Platelets: 374 10*3/uL (ref 150–450)
RBC: 4.25 x10E6/uL (ref 3.77–5.28)
RDW: 13.4 % (ref 11.7–15.4)
WBC: 8.9 10*3/uL (ref 3.4–10.8)

## 2020-07-06 LAB — COMPREHENSIVE METABOLIC PANEL
ALT: 11 IU/L (ref 0–32)
AST: 17 IU/L (ref 0–40)
Albumin/Globulin Ratio: 1.9 (ref 1.2–2.2)
Albumin: 4.4 g/dL (ref 3.8–4.8)
Alkaline Phosphatase: 72 IU/L (ref 44–121)
BUN/Creatinine Ratio: 14 (ref 12–28)
BUN: 9 mg/dL (ref 8–27)
Bilirubin Total: 0.3 mg/dL (ref 0.0–1.2)
CO2: 29 mmol/L (ref 20–29)
Calcium: 9.8 mg/dL (ref 8.7–10.3)
Chloride: 90 mmol/L — ABNORMAL LOW (ref 96–106)
Creatinine, Ser: 0.64 mg/dL (ref 0.57–1.00)
Globulin, Total: 2.3 g/dL (ref 1.5–4.5)
Glucose: 101 mg/dL — ABNORMAL HIGH (ref 65–99)
Potassium: 3.9 mmol/L (ref 3.5–5.2)
Sodium: 130 mmol/L — ABNORMAL LOW (ref 134–144)
Total Protein: 6.7 g/dL (ref 6.0–8.5)
eGFR: 96 mL/min/{1.73_m2} (ref >59)

## 2020-07-06 LAB — LIPID PANEL
Chol/HDL Ratio: 2.7 ratio (ref 0.0–4.4)
Cholesterol, Total: 129 mg/dL (ref 100–199)
HDL: 48 mg/dL (ref >39)
LDL Chol Calc (NIH): 58 mg/dL (ref 0–99)
Triglycerides: 132 mg/dL (ref 0–149)
VLDL Cholesterol Cal: 23 mg/dL (ref 5–40)

## 2020-07-06 LAB — SEDIMENTATION RATE: Sed Rate: 5 mm/hr (ref 0–40)

## 2020-07-06 LAB — HEMOGLOBIN A1C
Est. average glucose Bld gHb Est-mCnc: 126 mg/dL
Hgb A1c MFr Bld: 6 % — ABNORMAL HIGH (ref 4.8–5.6)

## 2020-07-06 LAB — CARDIOVASCULAR RISK ASSESSMENT

## 2020-07-06 LAB — TSH: TSH: 9.91 u[IU]/mL — ABNORMAL HIGH (ref 0.450–4.500)

## 2020-07-07 ENCOUNTER — Other Ambulatory Visit: Payer: Self-pay

## 2020-07-07 DIAGNOSIS — E039 Hypothyroidism, unspecified: Secondary | ICD-10-CM

## 2020-07-07 MED ORDER — LEVOTHYROXINE SODIUM 25 MCG PO TABS: 25.0000 ug | ORAL_TABLET | Freq: Every day | ORAL | 3 refills | Status: DC

## 2020-07-07 NOTE — Progress Notes (Signed)
syn

## 2020-07-08 DIAGNOSIS — I609 Nontraumatic subarachnoid hemorrhage, unspecified: Secondary | ICD-10-CM | POA: Diagnosis not present

## 2020-07-10 ENCOUNTER — Encounter: Payer: Self-pay | Admitting: Family Medicine

## 2020-07-10 DIAGNOSIS — I10 Essential (primary) hypertension: Secondary | ICD-10-CM | POA: Insufficient documentation

## 2020-07-10 DIAGNOSIS — E039 Hypothyroidism, unspecified: Secondary | ICD-10-CM | POA: Insufficient documentation

## 2020-07-10 DIAGNOSIS — G43801 Other migraine, not intractable, with status migrainosus: Secondary | ICD-10-CM | POA: Insufficient documentation

## 2020-07-10 DIAGNOSIS — I152 Hypertension secondary to endocrine disorders: Secondary | ICD-10-CM | POA: Insufficient documentation

## 2020-07-10 DIAGNOSIS — R7303 Prediabetes: Secondary | ICD-10-CM | POA: Insufficient documentation

## 2020-07-10 DIAGNOSIS — E1159 Type 2 diabetes mellitus with other circulatory complications: Secondary | ICD-10-CM | POA: Insufficient documentation

## 2020-07-11 ENCOUNTER — Emergency Department (HOSPITAL_COMMUNITY)
Admission: EM | Admit: 2020-07-11 | Discharge: 2020-07-11 | Disposition: A | Discharge status: Home / Self Care | Payer: Medicare PPO | Attending: Emergency Medicine | Admitting: Emergency Medicine

## 2020-07-11 ENCOUNTER — Emergency Department: Payer: Self-pay

## 2020-07-11 ENCOUNTER — Encounter (HOSPITAL_COMMUNITY): Payer: Self-pay | Admitting: Emergency Medicine

## 2020-07-11 ENCOUNTER — Emergency Department (HOSPITAL_COMMUNITY): Payer: Medicare PPO

## 2020-07-11 DIAGNOSIS — Z79899 Other long term (current) drug therapy: Secondary | ICD-10-CM | POA: Diagnosis not present

## 2020-07-11 DIAGNOSIS — Z7982 Long term (current) use of aspirin: Secondary | ICD-10-CM | POA: Insufficient documentation

## 2020-07-11 DIAGNOSIS — R9389 Abnormal findings on diagnostic imaging of other specified body structures: Secondary | ICD-10-CM | POA: Diagnosis not present

## 2020-07-11 DIAGNOSIS — R930 Abnormal findings on diagnostic imaging of skull and head, not elsewhere classified: Secondary | ICD-10-CM | POA: Insufficient documentation

## 2020-07-11 DIAGNOSIS — E039 Hypothyroidism, unspecified: Secondary | ICD-10-CM | POA: Diagnosis not present

## 2020-07-11 DIAGNOSIS — F17210 Nicotine dependence, cigarettes, uncomplicated: Secondary | ICD-10-CM | POA: Insufficient documentation

## 2020-07-11 DIAGNOSIS — R519 Headache, unspecified: Secondary | ICD-10-CM | POA: Diagnosis present

## 2020-07-11 DIAGNOSIS — G43809 Other migraine, not intractable, without status migrainosus: Secondary | ICD-10-CM | POA: Insufficient documentation

## 2020-07-11 DIAGNOSIS — I609 Nontraumatic subarachnoid hemorrhage, unspecified: Secondary | ICD-10-CM | POA: Diagnosis not present

## 2020-07-11 DIAGNOSIS — J449 Chronic obstructive pulmonary disease, unspecified: Secondary | ICD-10-CM | POA: Insufficient documentation

## 2020-07-11 DIAGNOSIS — I672 Cerebral atherosclerosis: Secondary | ICD-10-CM | POA: Diagnosis not present

## 2020-07-11 DIAGNOSIS — I6523 Occlusion and stenosis of bilateral carotid arteries: Secondary | ICD-10-CM | POA: Diagnosis not present

## 2020-07-11 DIAGNOSIS — Z87828 Personal history of other (healed) physical injury and trauma: Secondary | ICD-10-CM | POA: Diagnosis not present

## 2020-07-11 DIAGNOSIS — M47812 Spondylosis without myelopathy or radiculopathy, cervical region: Secondary | ICD-10-CM | POA: Diagnosis not present

## 2020-07-11 HISTORY — PX: MRI: SHX5353

## 2020-07-11 MED ORDER — IOHEXOL 350 MG/ML SOLN
100.0000 mL | Freq: Once | INTRAVENOUS | Status: AC | PRN
  Administered 2020-07-11: 75 mL via INTRAVENOUS

## 2020-07-11 MED ORDER — SODIUM CHLORIDE (PF) 0.9 % IJ SOLN
INTRAMUSCULAR | Status: AC
  Filled 2020-07-11: qty 50

## 2020-07-11 MED ORDER — ACETAMINOPHEN 325 MG PO TABS
650.0000 mg | ORAL_TABLET | Freq: Once | ORAL | Status: AC
  Administered 2020-07-11: 650 mg via ORAL
  Filled 2020-07-11: qty 2

## 2020-07-11 NOTE — ED Triage Notes (Signed)
Patient reports sent by neurology after called for MRI results showing brain bleed. Patient c/o worsening headaches and "white flashing" in L eye.

## 2020-07-11 NOTE — ED Provider Notes (Signed)
[facility] EMERGENCY DEPARTMENT Provider Note  CSN: [id number] Arrival date & time: 07/11/20 1237    History Chief Complaint  Patient presents with   Abnormal Lab    Allison Jimenez is a 70 y.o. female with history of migraines reports increasing frequency of headaches for the last few weeks, associated with seeing 'flashers'. She has been to see her neurologist who has tried different medications without improvement and so she was sent for an MRI last week. She reports the MRI was done at the [facility]). She was called by her neurologist who told her the MRI showed 'bleeding on the brain' and advised her to come to the ED. She reports some continued headache, no numbness, weakness, facial droop. She does not take any blood thinners, she takes ASA daily. She was also recently diagnosed with hypothyroidism and started on Synthroid.    Past Medical History:  Diagnosis Date   Allergy    ANXIETY 06/05/2006   Arthritis    SHOULDERS    BACK PAIN 01/05/2010   Cataract    bilateral, left worse   Centrilobular emphysema (HCC)    Centrilobular emphysema (HCC)    COPD (chronic obstructive pulmonary disease) ([place])    DIVERTICULOSIS, COLON 06/05/2006   DIZZINESS OR VERTIGO 06/05/2006   positional   Hearing loss in left ear    HYPERLIPIDEMIA 06/05/2006   HYPOKALEMIA 12/14/2008   MENIERE'S DISEASE 06/25/2006   Migraine    TOBACCO USER 11/25/2007    Past Surgical History:  Procedure Laterality Date   APPENDECTOMY     CESAREAN SECTION     x1   CHOLECYSTECTOMY     COLONOSCOPY     COSMETIC SURGERY     mastoid surgery  1992   shunt in mastoid- endolymphatic sac in left ear    TUBAL LIGATION     vocal cord surgery     nodule x2    Family History  Problem Relation Age of Onset   Breast cancer Mother    Migraines Other    Depression Other    Anxiety disorder Other    High Cholesterol Other    Colon cancer Neg Hx    Rectal cancer Neg Hx    Stomach cancer Neg Hx     Colon polyps Neg Hx    Esophageal cancer Neg Hx     Social History   Tobacco Use   Smoking status: Every Day    Packs/day: 1.00    Years: 43.00    Pack years: 43.00    Types: Cigarettes   Smokeless tobacco: Never   Tobacco comments:    using electronic cigs.-12 cigs a day as well  Vaping Use   Vaping Use: Some days   Substances: Nicotine  Substance Use Topics   Alcohol use: No    Alcohol/week: 0.0 standard drinks   Drug use: No     Home Medications Prior to Admission medications   Medication Sig Start Date End Date Taking? Authorizing Provider  albuterol (VENTOLIN HFA) 108 (90 Base) MCG/ACT inhaler Inhale into the lungs. 07/08/18   [provider]  ALPRAZolam Duanne Moron) 0.25 MG tablet TAKE 1 TABLET(0.25 MG) BY MOUTH THREE TIMES DAILY 05/18/20   Cox, Elnita Maxwell, MD  aspirin EC 325 MG tablet Take 325 mg by mouth daily.    [provider]  atorvastatin (LIPITOR) 20 MG tablet TAKE 1 TABLET(20 MG) BY MOUTH DAILY 04/04/20   Marge Duncans, PA-C  azithromycin (ZITHROMAX) 250 MG tablet 2 pills  first day, 1 pill daily x 4 days. 07/05/20   Cox, Elnita Maxwell, MD  chlorthalidone (HYGROTON) 25 MG tablet TAKE 1 TABLET(25 MG) BY MOUTH EVERY DAY 05/18/20   Cox, Kirsten, MD  cyclobenzaprine (FLEXERIL) 10 MG tablet Take by mouth. 11/10/19   [provider]  diclofenac Sodium (VOLTAREN) 1 % GEL diclofenac 1 % topical gel 06/15/19   Cox, Kirsten, MD  dicyclomine (BENTYL) 20 MG tablet dicyclomine 20 mg tablet  TK 1 T PO BID PRN    [provider]  Erenumab-aooe (AIMOVIG) 140 MG/ML SOAJ Aimovig Autoinjector 140 mg/mL subcutaneous auto-injector  Inject by subcutaneous route.    [provider]  FLUoxetine (PROZAC) 20 MG capsule Take 2 capsules (40 mg total) by mouth daily. 06/03/20   Lillard Anes, MD  levothyroxine (SYNTHROID) 25 MCG tablet Take 1 tablet (25 mcg total) by mouth daily. 07/07/20   Cox, Elnita Maxwell, MD  meloxicam (MOBIC) 7.5 MG tablet Take 7.5 mg by mouth  daily.    [provider]  montelukast (SINGULAIR) 10 MG tablet TAKE 1 TABLET(10 MG) BY MOUTH AT BEDTIME 12/18/19   Cox, Kirsten, MD  nystatin-triamcinolone (MYCOLOG II) cream nystatin-triamcinolone 100,000 unit/g-0.1 % topical cream    [provider]  ondansetron (ZOFRAN) 4 MG tablet TAKE 1 TABLET(4 MG) BY MOUTH EVERY 8 HOURS AS NEEDED FOR NAUSEA OR VOMITING 01/19/20   Cox, Kirsten, MD  potassium chloride (MICRO-K) 10 MEQ CR capsule TAKE 1 CAPSULE(10 MEQ) BY MOUTH EVERY DAY WITH FOOD 06/02/20   Marge Duncans, PA-C  promethazine (PHENERGAN) 25 MG tablet TAKE 1 TABLET BY MOUTH EVERY 8 HOURS IF NEEDED 04/26/20   Rip Harbour, NP  rizatriptan (MAXALT) 10 MG tablet Take 1 tablet (10 mg total) by mouth as needed for migraine. May repeat in 2 hours if needed 04/30/17   Pieter Partridge, DO  traZODone (DESYREL) 50 MG tablet Take 1 tablet (50 mg total) by mouth at bedtime as needed for sleep. 11/10/19   CoxElnita Maxwell, MD     Allergies    Erythromycin base and Penicillins   Review of Systems   Review of Systems A comprehensive review of systems was completed and negative except as noted in HPI.    Physical Exam BP (!) 143/88   Pulse 77   Temp 98 F (36.7 C) (Oral)   Resp 18   SpO2 99%   Physical Exam Vitals and nursing note reviewed.  Constitutional:      Appearance: Normal appearance.  HENT:     Head: Normocephalic and atraumatic.     Nose: Nose normal.     Mouth/Throat:     Mouth: Mucous membranes are moist.  Eyes:     Extraocular Movements: Extraocular movements intact.     Conjunctiva/sclera: Conjunctivae normal.  Cardiovascular:     Rate and Rhythm: Normal rate.  Pulmonary:     Effort: Pulmonary effort is normal.     Breath sounds: Normal breath sounds.  Abdominal:     General: Abdomen is flat.     Palpations: Abdomen is soft.     Tenderness: There is no abdominal tenderness.  Musculoskeletal:        General: No swelling. Normal range of motion.     Cervical  back: Neck supple.  Skin:    General: Skin is warm and dry.  Neurological:     General: No focal deficit present.     Mental Status: She is alert.     Cranial Nerves: No cranial nerve  deficit.     Sensory: No sensory deficit.     Motor: No weakness.     Gait: Gait normal.  Psychiatric:        Mood and Affect: Mood normal.     ED Results / Procedures / Treatments   Labs (all labs ordered are listed, but only abnormal results are displayed) Labs Reviewed - No data to display  EKG None  Radiology CT Angio Head W or Wo Contrast  Result Date: 07/11/2020 CLINICAL DATA:  Subarachnoid hemorrhage seen on outpatient MRI. EXAM: CT ANGIOGRAPHY HEAD AND NECK TECHNIQUE: Multidetector CT imaging of the head and neck was performed using the standard protocol during bolus administration of intravenous contrast. Multiplanar CT image reconstructions and MIPs were obtained to evaluate the vascular anatomy. Carotid stenosis measurements (when applicable) are obtained utilizing NASCET criteria, using the distal internal carotid diameter as the denominator. CONTRAST:  50mL OMNIPAQUE IOHEXOL 350 MG/ML SOLN COMPARISON:  None. FINDINGS: CT HEAD FINDINGS Brain: Mild age related volume loss. No evidence of old or acute infarction, mass, hemorrhage, hydrocephalus or extra-axial collection. Vascular: There is atherosclerotic calcification of the major vessels at the base of the brain. Skull: Negative Sinuses: Clear Orbits: Normal Review of the MIP images confirms the above findings CTA NECK FINDINGS Aortic arch: Aortic atherosclerotic calcification. Branching pattern is normal without origin stenosis. Right carotid system: Common carotid artery widely patent to the bifurcation. Scattered calcified plaque. Soft and calcified plaque at the carotid bifurcation and ICA bulb but no stenosis. Cervical ICA widely patent. Left carotid system: Common carotid artery shows scattered plaque but no stenosis. At the bifurcation,  there is minimal plaque but no stenosis. Cervical ICA is normal. Vertebral arteries: Both vertebral artery origins are widely patent. Both vertebral arteries appear normal through the cervical region to the foramen magnum. Skeleton: Ordinary degenerative arthritis at C1-2 and mild midcervical spondylosis. Other neck: No soft tissue mass or lymphadenopathy. Upper chest: Advanced emphysema. No focal or active finding otherwise. Review of the MIP images confirms the above findings CTA HEAD FINDINGS Anterior circulation: Both internal carotid arteries are widely patent through the skull base and siphon regions. There is ordinary siphon atherosclerotic calcification but no stenosis. The anterior and middle cerebral vessels are normal. No occlusion, proximal stenosis, aneurysm or vascular malformation. Posterior circulation: Both vertebral arteries widely patent to the basilar. No basilar stenosis. Posterior circulation branch vessels are normal. Venous sinuses: Patent and normal. Anatomic variants: None significant. Review of the MIP images confirms the above findings IMPRESSION: Head CT does not show any acute finding. No visible hemorrhage. Age related atrophy. Aortic Atherosclerosis (ICD10-I70.0) and Emphysema (ICD10-J43.9). Atherosclerotic disease affecting both common carotid arteries and carotid bifurcations, but without flow limiting stenosis. Mild atherosclerotic calcification in the carotid siphon regions. No abnormality seen otherwise of the intracranial vasculature. No visible aneurysm or vascular malformation. Electronically Signed   By: Nelson Chimes M.D.   On: 07/11/2020 15:37   CT Angio Neck W and/or Wo Contrast  Result Date: 07/11/2020 CLINICAL DATA:  Subarachnoid hemorrhage seen on outpatient MRI. EXAM: CT ANGIOGRAPHY HEAD AND NECK TECHNIQUE: Multidetector CT imaging of the head and neck was performed using the standard protocol during bolus administration of intravenous contrast. Multiplanar CT image  reconstructions and MIPs were obtained to evaluate the vascular anatomy. Carotid stenosis measurements (when applicable) are obtained utilizing NASCET criteria, using the distal internal carotid diameter as the denominator. CONTRAST:  26mL OMNIPAQUE IOHEXOL 350 MG/ML SOLN COMPARISON:  None. FINDINGS: CT HEAD FINDINGS Brain:  Mild age related volume loss. No evidence of old or acute infarction, mass, hemorrhage, hydrocephalus or extra-axial collection. Vascular: There is atherosclerotic calcification of the major vessels at the base of the brain. Skull: Negative Sinuses: Clear Orbits: Normal Review of the MIP images confirms the above findings CTA NECK FINDINGS Aortic arch: Aortic atherosclerotic calcification. Branching pattern is normal without origin stenosis. Right carotid system: Common carotid artery widely patent to the bifurcation. Scattered calcified plaque. Soft and calcified plaque at the carotid bifurcation and ICA bulb but no stenosis. Cervical ICA widely patent. Left carotid system: Common carotid artery shows scattered plaque but no stenosis. At the bifurcation, there is minimal plaque but no stenosis. Cervical ICA is normal. Vertebral arteries: Both vertebral artery origins are widely patent. Both vertebral arteries appear normal through the cervical region to the foramen magnum. Skeleton: Ordinary degenerative arthritis at C1-2 and mild midcervical spondylosis. Other neck: No soft tissue mass or lymphadenopathy. Upper chest: Advanced emphysema. No focal or active finding otherwise. Review of the MIP images confirms the above findings CTA HEAD FINDINGS Anterior circulation: Both internal carotid arteries are widely patent through the skull base and siphon regions. There is ordinary siphon atherosclerotic calcification but no stenosis. The anterior and middle cerebral vessels are normal. No occlusion, proximal stenosis, aneurysm or vascular malformation. Posterior circulation: Both vertebral arteries  widely patent to the basilar. No basilar stenosis. Posterior circulation branch vessels are normal. Venous sinuses: Patent and normal. Anatomic variants: None significant. Review of the MIP images confirms the above findings IMPRESSION: Head CT does not show any acute finding. No visible hemorrhage. Age related atrophy. Aortic Atherosclerosis (ICD10-I70.0) and Emphysema (ICD10-J43.9). Atherosclerotic disease affecting both common carotid arteries and carotid bifurcations, but without flow limiting stenosis. Mild atherosclerotic calcification in the carotid siphon regions. No abnormality seen otherwise of the intracranial vasculature. No visible aneurysm or vascular malformation. Electronically Signed   By: Nelson Chimes M.D.   On: 07/11/2020 15:37    Procedures Procedures  Medications Ordered in the ED Medications  acetaminophen (TYLENOL) tablet 650 mg (650 mg Oral Given 07/11/20 1425)  iohexol (OMNIPAQUE) 350 MG/ML injection 100 mL (75 mLs Intravenous Contrast Given 07/11/20 1452)  sodium chloride (PF) 0.9 % injection (  Given by Other 07/11/20 1503)     MDM Rules/Calculators/A&P MDM The patient's MRI was done at [facility] (a [facility] facility) but neither the images or the report are available in [place]. I will attempt to contact the [facility] to obtain a copy of the report and hopefully also get the images sent to our PACS.   ED Course  I have reviewed the triage vital signs and the nursing notes.  Pertinent labs & imaging results that were available during my care of the patient were reviewed by me and considered in my medical decision making (see chart for details).  Clinical Course as of 07/11/20 1600  Mon Jul 11, 2020  1343 No answer at [facility] records to get images sent to PACS. Surveyor, quantity is going to try to fax the report for review.  [CS]  6712 Impression from MRI reads: "Compared with 10/01/2017, there is a new area of superficial subarachnoid  hemorrhage over the right parietal convexity. This is of uncertain acuity. "  Will discuss ED workup with Neurology.  [CS]  4580 DXIPJ with Dr. Rory Percy, neurology who recommends CT and CTA to evaluate reported MRI findings. Patient requesting some tylenol for headache.  [CS]  1407 Labs done last  week reviewed, normal kidney function then. [CS]  9574 Spoke with Medical Records at [facility], who will push the MRI images from last week and from 2019 into [facility].  [CS]  7340 CT/CTA head are negative. Reviewed MRI and CT images with Dr. Rory Percy. No signs of acute SAH that would be causing her headaches. She was encouraged to continue her home medications and follow up with her outpatient neurologist for further management.  [CS]    Clinical Course User Index [CS] Truddie Hidden, MD    Final Clinical Impression(s) / ED Diagnoses Final diagnoses:  Other migraine without status migrainosus, not intractable  Abnormal MRI of head    Rx / DC Orders ED Discharge Orders     None        Truddie Hidden, MD 07/11/20 1600

## 2020-07-14 ENCOUNTER — Other Ambulatory Visit (HOSPITAL_COMMUNITY)
Admission: RE | Admit: 2020-07-14 | Discharge: 2020-07-14 | Disposition: A | Discharge status: Home / Self Care | Payer: Medicare PPO | Source: Ambulatory Visit

## 2020-07-14 DIAGNOSIS — Z029 Encounter for administrative examinations, unspecified: Secondary | ICD-10-CM | POA: Diagnosis present

## 2020-07-14 DIAGNOSIS — R519 Headache, unspecified: Secondary | ICD-10-CM | POA: Diagnosis not present

## 2020-07-14 LAB — CBC WITH DIFFERENTIAL/PLATELET
Abs Immature Granulocytes: 0.04 10*3/uL (ref 0.00–0.07)
Basophils Absolute: 0.1 10*3/uL (ref 0.0–0.1)
Basophils Relative: 1 %
Eosinophils Absolute: 0.6 10*3/uL — ABNORMAL HIGH (ref 0.0–0.5)
Eosinophils Relative: 6 %
HCT: 35.7 % — ABNORMAL LOW (ref 36.0–46.0)
Hemoglobin: 12 g/dL (ref 12.0–15.0)
Immature Granulocytes: 0 %
Lymphocytes Relative: 26 %
Lymphs Abs: 2.4 10*3/uL (ref 0.7–4.0)
MCH: 28.6 pg (ref 26.0–34.0)
MCHC: 33.6 g/dL (ref 30.0–36.0)
MCV: 85 fL (ref 80.0–100.0)
Monocytes Absolute: 0.7 10*3/uL (ref 0.1–1.0)
Monocytes Relative: 8 %
Neutro Abs: 5.4 10*3/uL (ref 1.7–7.7)
Neutrophils Relative %: 59 %
Platelets: 326 10*3/uL (ref 150–400)
RBC: 4.2 MIL/uL (ref 3.87–5.11)
RDW: 13.3 % (ref 11.5–15.5)
WBC: 9.2 10*3/uL (ref 4.0–10.5)
nRBC: 0 % (ref 0.0–0.2)

## 2020-07-14 LAB — C-REACTIVE PROTEIN: CRP: 0.6 mg/dL (ref <1.0)

## 2020-07-14 LAB — SEDIMENTATION RATE: Sed Rate: 15 mm/hr (ref 0–22)

## 2020-07-19 ENCOUNTER — Other Ambulatory Visit: Payer: Self-pay | Admitting: Family Medicine

## 2020-07-19 DIAGNOSIS — R531 Weakness: Secondary | ICD-10-CM | POA: Diagnosis not present

## 2020-07-19 DIAGNOSIS — M545 Low back pain, unspecified: Secondary | ICD-10-CM | POA: Diagnosis not present

## 2020-07-19 DIAGNOSIS — M2560 Stiffness of unspecified joint, not elsewhere classified: Secondary | ICD-10-CM | POA: Diagnosis not present

## 2020-07-19 DIAGNOSIS — M25552 Pain in left hip: Secondary | ICD-10-CM | POA: Diagnosis not present

## 2020-07-19 DIAGNOSIS — M542 Cervicalgia: Secondary | ICD-10-CM | POA: Diagnosis not present

## 2020-07-20 ENCOUNTER — Telehealth: Payer: Self-pay

## 2020-07-20 NOTE — Telephone Encounter (Signed)
Pt calling due to ongoing fatigue, "flashers in L eye" and headaches. Pt has been on thyroid medication 2 weeks. Pt also stated she had brain imaging last Monday at first they believed their was bleeding but the second imaging was negative. Will attach findings.   Found MRI in care everywhere from 07/08/20. Findings are as follows:  There is an area of superficial subarachnoid hemorrhage over the right parietal convexity with low signal on the gradient echo image and high signal on the FLAIR image. This is new compared with the prior study.  There are several small  foci of T2 hyperintensity in the cerebral white matter. These do not have restricted diffusion.  There is no acute or chronic infarct.  There is normal flow signal void in both internal carotid arteries and the basilar artery.  There is no mass lesion.  There is no hydrocephalus.  The bone marrow signal is normal.  The paranasal sinuses are clear.  Extracranial soft tissues are normal.   Pt then had CT on 7/11. Findings are as follows:   Brain: Mild age related volume loss. No evidence of old or acute infarction, mass, hemorrhage, hydrocephalus or extra-axial collection. Vascular: There is atherosclerotic calcification of the major vessels at the base of the brain. Skull: Negative Sinuses: Clear Orbits: Normal Review of the MIP images confirms the above findings   CTA NECK FINDINGS   Aortic arch: Aortic atherosclerotic calcification. Branching pattern is normal without origin stenosis. Right carotid system: Common carotid artery widely patent to the bifurcation. Scattered calcified plaque. Soft and calcified plaque at the carotid bifurcation and ICA bulb but no stenosis. Cervical ICA widely patent. Left carotid system: Common carotid artery shows scattered plaque but no stenosis. At the bifurcation, there is minimal plaque but no stenosis. Cervical ICA is normal. Vertebral arteries: Both vertebral artery origins are  widely patent. Both vertebral arteries appear normal through the cervical region to the foramen magnum. Skeleton: Ordinary degenerative arthritis at C1-2 and mild midcervical spondylosis. Other neck: No soft tissue mass or lymphadenopathy. Upper chest: Advanced emphysema. No focal or active finding otherwise. Review of the MIP images confirms the above findings   CTA HEAD FINDINGS   Anterior circulation: Both internal carotid arteries are widely patent through the skull base and siphon regions. There is ordinary siphon atherosclerotic calcification but no stenosis. The anterior and middle cerebral vessels are normal. No occlusion, proximal stenosis, aneurysm or vascular malformation. Posterior circulation: Both vertebral arteries widely patent to the basilar. No basilar stenosis. Posterior circulation branch vessels are normal. Venous sinuses: Patent and normal. Anatomic variants: None significant.  [name], [place] 07/20/20 10:26 AM

## 2020-07-20 NOTE — Telephone Encounter (Signed)
Pt returned call. Pt made appointment for 8/1. Pt states she saw eye doctor last about a week ago, he also did labs which were negative.   Harrell Lark 07/20/20 2:57 PM

## 2020-07-20 NOTE — Telephone Encounter (Signed)
Attempted to call pt. No answer, left detailed message on identifying VM box requesting call back for an appointment. Pt did state earlier she has seen eye doctor who stated everything was normal. Pt did not state when, Will ask when pt calls back, when she was seen by eye dr.   [name], [place] 07/20/20 1:59 PM

## 2020-07-26 ENCOUNTER — Ambulatory Visit: Payer: Medicare PPO | Admitting: Vascular Surgery

## 2020-07-26 ENCOUNTER — Ambulatory Visit (HOSPITAL_COMMUNITY)
Admission: RE | Admit: 2020-07-26 | Discharge: 2020-07-26 | Disposition: A | Discharge status: Home / Self Care | Payer: Medicare PPO | Source: Ambulatory Visit | Attending: Vascular Surgery | Admitting: Vascular Surgery

## 2020-07-26 ENCOUNTER — Other Ambulatory Visit: Payer: Self-pay

## 2020-07-26 ENCOUNTER — Encounter: Payer: Self-pay | Admitting: Vascular Surgery

## 2020-07-26 VITALS — BP 120/68 | HR 84 | Temp 97.7°F | Resp 16 | Ht 60.0 in | Wt 123.0 lb

## 2020-07-26 DIAGNOSIS — I6523 Occlusion and stenosis of bilateral carotid arteries: Secondary | ICD-10-CM | POA: Diagnosis not present

## 2020-07-26 NOTE — Progress Notes (Signed)
ASSESSMENT & PLAN:  Allison Jimenez is a 70 y.o. female with asymptomatic mild bilateral carotid artery stenosis.  The patient should continue best medical therapy for carotid artery stenosis including: Complete cessation from all tobacco products. Blood glucose control with goal A1c < 7%. Blood pressure control with goal blood pressure < 140/90 mmHg. Lipid reduction therapy with goal LDL-C <100 mg/dL (<70 if symptomatic from carotid artery stenosis).  Aspirin '81mg'$  PO QD.  Atorvastatin 40-'80mg'$  PO QD (or other "high intensity" statin therapy).  Follow up with me or PA in 2 years with repeat duplex.  CHIEF COMPLAINT:   Carotid stenosis  HISTORY:  HISTORY OF PRESENT ILLNESS: Allison Jimenez is a 70 y.o. female referred to clinic for evaluation of carotid artery stenosis.  This was found in the course of the work-up of severe headache, pulsatile tinnitus.  Patient reports a long history of Mnire's disease, migraine headache, anxiety.  She denies any symptoms typical of stroke or TIA.  She specifically denies amaurosis, unilateral weakness or numbness, dysphagia or dysarthria.  Course of work-up a carotid duplex was obtained.  This revealed bilateral carotid artery stenosis.  She is here to discuss.  07/26/20: Returns to clinic for surveillance of carotid artery stenosis.  She had a recent a work-up for potential intracranial hemorrhage including a CT angiogram of her head and neck.  This revealed mild, if any stenosis in her carotid arteries.  She has been started on Synthroid for thyroidism, with significant improvement in flashers, headaches, and lethargy.  Continues to smoke.  Past Medical History:  Diagnosis Date   Allergy    ANXIETY 06/05/2006   Arthritis    SHOULDERS    BACK PAIN 01/05/2010   Cataract    bilateral, left worse   Centrilobular emphysema (HCC)    Centrilobular emphysema (HCC)    COPD (chronic obstructive pulmonary disease) ([place])    DIVERTICULOSIS, COLON 06/05/2006    DIZZINESS OR VERTIGO 06/05/2006   positional   Hearing loss in left ear    HYPERLIPIDEMIA 06/05/2006   HYPOKALEMIA 12/14/2008   MENIERE'S DISEASE 06/25/2006   Migraine    TOBACCO USER 11/25/2007    Past Surgical History:  Procedure Laterality Date   APPENDECTOMY     CESAREAN SECTION     x1   CHOLECYSTECTOMY     COLONOSCOPY     COSMETIC SURGERY     mastoid surgery  1992   shunt in mastoid- endolymphatic sac in left ear    TUBAL LIGATION     vocal cord surgery     nodule x2    Family History  Problem Relation Age of Onset   Breast cancer Mother    Migraines Other    Depression Other    Anxiety disorder Other    High Cholesterol Other    Colon cancer Neg Hx    Rectal cancer Neg Hx    Stomach cancer Neg Hx    Colon polyps Neg Hx    Esophageal cancer Neg Hx     Social History   Socioeconomic History   Marital status: Divorced    Spouse name: Not on file   Number of children: 1   Years of education: 13   Highest education level: Not on file  Occupational History    Employer: [employer]    Comment: Retired  Tobacco Use   Smoking status: Every Day    Packs/day: 0.50    Years: 43.00    Pack years: 21.50  Types: Cigarettes   Smokeless tobacco: Never   Tobacco comments:    using electronic cigs.-12 cigs a day as well  Vaping Use   Vaping Use: Some days   Substances: Nicotine  Substance and Sexual Activity   Alcohol use: No    Alcohol/week: 0.0 standard drinks   Drug use: No   Sexual activity: Not on file  Other Topics Concern   Not on file  Social History Narrative   Patient lives at home alone. Patient has a Data processing manager. Patient is divorced .   Patient is retired.   Education high school.   Right handed.   Caffeine none.   Social Determinants of Health   Financial Resource Strain: Not on file  Food Insecurity: Not on file  Transportation Needs: Not on file  Physical Activity: Not on file  Stress: Not on file  Social  Connections: Not on file  Intimate Partner Violence: Not on file    Allergies  Allergen Reactions   Erythromycin Base Diarrhea   Penicillins     Current Outpatient Medications  Medication Sig Dispense Refill   albuterol (VENTOLIN HFA) 108 (90 Base) MCG/ACT inhaler Inhale into the lungs.     ALPRAZolam (XANAX) 0.25 MG tablet TAKE 1 TABLET(0.25 MG) BY MOUTH THREE TIMES DAILY 90 tablet 2   aspirin EC 325 MG tablet Take 325 mg by mouth daily.     atorvastatin (LIPITOR) 20 MG tablet TAKE 1 TABLET(20 MG) BY MOUTH DAILY 90 tablet 1   chlorthalidone (HYGROTON) 25 MG tablet TAKE 1 TABLET(25 MG) BY MOUTH EVERY DAY 90 tablet 4   cyclobenzaprine (FLEXERIL) 10 MG tablet Take by mouth.     diclofenac Sodium (VOLTAREN) 1 % GEL diclofenac 1 % topical gel 100 g 0   dicyclomine (BENTYL) 20 MG tablet dicyclomine 20 mg tablet  TK 1 T PO BID PRN     Erenumab-aooe (AIMOVIG) 140 MG/ML SOAJ Aimovig Autoinjector 140 mg/mL subcutaneous auto-injector  Inject by subcutaneous route.     FLUoxetine (PROZAC) 20 MG capsule Take 2 capsules (40 mg total) by mouth daily. 180 capsule 1   levothyroxine (SYNTHROID) 25 MCG tablet Take 1 tablet (25 mcg total) by mouth daily. 90 tablet 3   meloxicam (MOBIC) 7.5 MG tablet Take 7.5 mg by mouth daily.     montelukast (SINGULAIR) 10 MG tablet TAKE 1 TABLET(10 MG) BY MOUTH AT BEDTIME 90 tablet 2   nystatin-triamcinolone (MYCOLOG II) cream nystatin-triamcinolone 100,000 unit/g-0.1 % topical cream     ondansetron (ZOFRAN) 4 MG tablet TAKE 1 TABLET(4 MG) BY MOUTH EVERY 8 HOURS AS NEEDED FOR NAUSEA OR VOMITING 90 tablet 0   potassium chloride (MICRO-K) 10 MEQ CR capsule TAKE 1 CAPSULE(10 MEQ) BY MOUTH EVERY DAY WITH FOOD 90 capsule 0   promethazine (PHENERGAN) 25 MG tablet TAKE 1 TABLET BY MOUTH EVERY 8 HOURS IF NEEDED 20 tablet 0   rizatriptan (MAXALT) 10 MG tablet Take 1 tablet (10 mg total) by mouth as needed for migraine. May repeat in 2 hours if needed 10 tablet 0   traZODone  (DESYREL) 50 MG tablet Take 1 tablet (50 mg total) by mouth at bedtime as needed for sleep. 30 tablet 3   No current facility-administered medications for this visit.    REVIEW OF SYSTEMS:  '[X]'$  denotes positive finding, '[ ]'$  denotes negative finding Cardiac  Comments:  Chest pain or chest pressure:    Shortness of breath upon exertion:    Short of breath when lying flat:  Irregular heart rhythm:        Vascular    Pain in calf, thigh, or hip brought on by ambulation:    Pain in feet at night that wakes you up from your sleep:     Blood clot in your veins:    Leg swelling:         Pulmonary    Oxygen at home:    Productive cough:     Wheezing:  x       Neurologic    Sudden weakness in arms or legs:     Sudden numbness in arms or legs:     Sudden onset of difficulty speaking or slurred speech:    Temporary loss of vision in one eye:     Problems with dizziness:  x       Gastrointestinal    Blood in stool:     Vomited blood:         Genitourinary    Burning when urinating:     Blood in urine:        Psychiatric    Major depression:  x       Hematologic    Bleeding problems:    Problems with blood clotting too easily:        Skin    Rashes or ulcers:        Constitutional    Fever or chills:     PHYSICAL EXAM:   Vitals:   07/26/20 1545 07/26/20 1549  BP: (!) 144/84 120/68  Pulse: 84   Resp: 16   Temp: 97.7 F (36.5 C)   SpO2: 99%   Weight: 123 lb (55.8 kg)   Height: 5' (1.524 m)    Constitutional: Well appearing in no distress. Appears well nourished.  Neurologic: Normal gait and station. CN intact. No weakness. No sensory loss. Psychiatric: Mood and affect symmetric and appropriate. Eyes: No icterus. No conjunctival pallor. Ears, nose, throat: mucous membranes moist. Midline trachea. Cardiac: regular rate and rhythm.  Respiratory: unlabored. Abdominal: soft, non-tender, non-distended.  Extremity: No edema. No cyanosis. No pallor.  Skin: No  gangrene. No ulceration.  Lymphatic: No Stemmer's sign. No palpable lymphadenopathy.'  DATA REVIEW:    Most recent CBC CBC Latest Ref Rng & Units 07/14/2020 07/05/2020 02/15/2020  WBC 4.0 - 10.5 K/uL 9.2 8.9 7.2  Hemoglobin 12.0 - 15.0 g/dL 12.0 11.8 11.7  Hematocrit 36.0 - 46.0 % 35.7(L) 37.5 37.0  Platelets 150 - 400 K/uL 326 374 411     Most recent CMP CMP Latest Ref Rng & Units 07/05/2020 02/15/2020 10/15/2019  Glucose 65 - 99 mg/dL 101(H) 106(H) 99  BUN 8 - 27 mg/dL '9 13 10  '$ Creatinine 0.57 - 1.00 mg/dL 0.64 0.69 0.66  Sodium 134 - 144 mmol/L 130(L) 135 132(L)  Potassium 3.5 - 5.2 mmol/L 3.9 3.8 3.9  Chloride 96 - 106 mmol/L 90(L) 95(L) 94(L)  CO2 20 - 29 mmol/L '29 25 26  '$ Calcium 8.7 - 10.3 mg/dL 9.8 9.8 9.7  Total Protein 6.0 - 8.5 g/dL 6.7 6.7 6.4  Total Bilirubin 0.0 - 1.2 mg/dL 0.3 0.2 0.4  Alkaline Phos 44 - 121 IU/L 72 63 61  AST 0 - 40 IU/L '17 19 18  '$ ALT 0 - 32 IU/L '11 13 17    '$ Renal function CrCl cannot be calculated (Patient's most recent lab result is older than the maximum 21 days allowed.).  Hgb A1c MFr Bld (%)  Date Value  07/05/2020 6.0 (H)  LDL Chol Calc (NIH)  Date Value Ref Range Status  07/05/2020 58 0 - 99 mg/dL Final   Direct LDL  Date Value Ref Range Status  09/13/2011 151.9 mg/dL Final    Comment:    Optimal:  <100 mg/dLNear or Above Optimal:  100-129 mg/dLBorderline High:  130-159 mg/dLHigh:  160-189 mg/dLVery High:  >190 mg/dL     Vascular Imaging: See outside report 11/22/19 (copied in A/P above).  CTA of head and neck 07/11/2020 personally reviewed.  Mild, if any carotid artery stenosis bilaterally.  Carotid duplex 07/26/2020 40 to 59% right carotid artery stenosis.  1 to 39% left carotid artery stenosis.  Yevonne Aline. Stanford Breed, MD [facility] Phone Number: [phone] 07/26/2020 4:29 PM

## 2020-08-01 ENCOUNTER — Other Ambulatory Visit: Payer: Self-pay | Admitting: Family Medicine

## 2020-08-01 ENCOUNTER — Other Ambulatory Visit: Payer: Self-pay

## 2020-08-01 ENCOUNTER — Ambulatory Visit: Payer: Medicare PPO | Admitting: Family Medicine

## 2020-08-01 VITALS — BP 106/50 | HR 88 | Temp 97.8°F | Resp 16 | Ht 60.0 in | Wt 124.4 lb

## 2020-08-01 DIAGNOSIS — G43109 Migraine with aura, not intractable, without status migrainosus: Secondary | ICD-10-CM

## 2020-08-01 DIAGNOSIS — E039 Hypothyroidism, unspecified: Secondary | ICD-10-CM | POA: Diagnosis not present

## 2020-08-01 DIAGNOSIS — I700 Atherosclerosis of aorta: Secondary | ICD-10-CM | POA: Diagnosis not present

## 2020-08-01 DIAGNOSIS — J302 Other seasonal allergic rhinitis: Secondary | ICD-10-CM

## 2020-08-01 DIAGNOSIS — Z9289 Personal history of other medical treatment: Secondary | ICD-10-CM

## 2020-08-01 HISTORY — DX: Personal history of other medical treatment: Z92.89

## 2020-08-01 NOTE — Patient Instructions (Signed)
Piriformis Syndrome  Piriformis syndrome is a condition that can cause pain and numbness in your buttocks and down the back of your leg. Piriformis syndrome happens when the small muscle that connects the base of your spine to your hip (piriformis muscle) presses on the nerve that runs down the back of your leg (sciatic nerve). The piriformis muscle helps your hip rotate and helps to bring your leg back and out. It also helps shift your weight to keep you stable while you are walking. The sciatic nerve runs under or through the piriformis muscle. Damage to the piriformis muscle can cause spasms that put pressure on the nerve below. This causes pain and discomfort while sitting and moving. The pain may feel as if it begins in the buttock and spreads (radiates) down your hip and thigh. What are the causes? This condition is caused by pressure on the sciatic nerve from the piriformis muscle. The piriformis muscle can get irritated with overuse, especially ifother hip muscles are weak and the piriformis muscle has to do extra work. Piriformis syndrome can also occur after an injury, like a fall onto yourbuttocks. What increases the risk? You are more likely to develop this condition if you: Are a woman. Sit for long periods of time. Are a cyclist. Have weak buttocks muscles (gluteal muscles). What are the signs or symptoms? Symptoms of this condition include: Pain, tingling, or numbness that starts in the buttock and runs down the back of your leg (sciatica). Pain in the groin or thigh area. Your symptoms may get worse: The longer you sit. When you walk, run, or climb stairs. When straining to have a bowel movement. How is this diagnosed? This condition is diagnosed based on your symptoms, medical history, and physical exam. During the exam, your health care provider may: Move your leg into different positions to check for pain. Press on the muscles of your hip and buttock to see if that  increases your symptoms. You may also have tests, including: Imaging tests such as X-rays, MRI, or ultrasound. Electromyogram (EMG). This test measures electrical signals sent by your nerves into the muscles. Nerve conduction study. This test measures how well electrical signals pass through your nerves. How is this treated? This condition may be treated by: Stopping all activities that cause pain or make your condition worse. Applying ice or using heat therapy. Taking medicines to reduce pain and swelling. Taking a muscle relaxer (muscle relaxant) to stop muscle spasms. Doing range-of-motion and strengthening exercises (physical therapy) as told by your health care provider. Massaging the area. Having acupuncture. Getting an injection of medicine in the piriformis muscle. Your health care provider will choose the medicine based on your condition. He or she may inject: An anti-inflammatory medicine (steroid) to reduce swelling. A numbing medicine (local anesthetic) to block the pain. Botulinum toxin. The toxin blocks nerve impulses to specific muscles to reduce muscle tension. In rare cases, you may need surgery to cut the muscle and release pressure onthe nerve if other treatments do not work. Follow these instructions at home: Activity Do not sit for long periods. Get up and walk around every 20 minutes or as often as told by your health care provider. When driving long distances, make sure to take frequent stops to get up and stretch. Use a cushion when you sit on hard surfaces. Do exercises as told by your health care provider. Return to your normal activities as told by your health care provider. Ask your health care provider  what activities are safe for you. Managing pain, stiffness, and swelling     If directed, apply heat to the affected area as often as told by your health care provider. Use the heat source that your health care provider recommends, such as a moist heat pack or  a heating pad. Place a towel between your skin and the heat source. Leave the heat on for 20-30 minutes. Remove the heat if your skin turns bright red. This is especially important if you are unable to feel pain, heat, or cold. You may have a greater risk of getting burned. If directed, put ice on the injured area. Put ice in a plastic bag. Place a towel between your skin and the bag. Leave the ice on for 20 minutes, 2-3 times a day. General instructions Take over-the-counter and prescription medicines only as told by your health care provider. Ask your health care provider if the medicine prescribed to you requires you to avoid driving or using heavy machinery. You may need to take actions to prevent or treat constipation, such as: Drink enough fluid to keep your urine pale yellow. Take over-the-counter or prescription medicines. Eat foods that are high in fiber, such as beans, whole grains, and fresh fruits and vegetables. Limit foods that are high in fat and processed sugars, such as fried or sweet foods. Keep all follow-up visits as told by your health care provider. This is important. How is this prevented? Do not sit for longer than 20 minutes at a time. When you sit, choose padded surfaces. Warm up and stretch before being active. Cool down and stretch after being active. Give your body time to rest between periods of activity. Make sure to use equipment that fits you. Maintain physical fitness, including: Strength. Flexibility. Contact a health care provider if: Your pain and stiffness continue or get worse. Your leg or hip becomes weak. You have changes in your bowel function or bladder function. Summary Piriformis syndrome is a condition that can cause pain, tingling, and numbness in your buttocks and down the back of your leg. You may try applying heat or ice to relieve the pain. Do not sit for long periods. Get up and walk around every 20 minutes or as often as told by  your health care provider. This information is not intended to replace advice given to you by your health care provider. Make sure you discuss any questions you have with your healthcare provider. Document Revised: 04/10/2018 Document Reviewed: 08/14/2017 Elsevier Patient Education  [employer].    Piriformis Syndrome Rehab Ask your health care provider which exercises are safe for you. Do exercises exactly as told by your health care provider and adjust them as directed. It is normal to feel mild stretching, pulling, tightness, or discomfort as you do these exercises. Stop right away if you feel sudden pain or your pain gets worse. Do not begin these exercises until told by your health care provider. Stretching and range-of-motion exercises These exercises warm up your muscles and joints and improve the movement and flexibility of your hip and pelvis. The exercises also help to relieve pain,numbness, and tingling. Hip rotation This is an exercise in which you lie on your back and stretch the muscles that rotate your hip (hip rotators) to stretch your buttocks. Lie on your back on a firm surface. Pull your left / right knee toward your same shoulder with your left / right hand until your knee is pointing toward the ceiling. Hold your  left / right ankle with your other hand. Keeping your knee steady, gently pull your left / right ankle toward your other shoulder until you feel a stretch in your buttocks. Hold this position for __________ seconds. Repeat __________ times. Complete this exercise __________ times a day. Hip extensor This is an exercise in which you lie on your back and pull your knee to your chest. Lie on your back on a firm surface. Both of your legs should be straight. Pull your left / right knee to your chest. Hold your leg in this position by holding onto the back of your thigh or the front of your knee. Hold this position for __________ seconds. Slowly return to the  starting position. Repeat __________ times. Complete this exercise __________ times a day. Strengthening exercises These exercises build strength and endurance in your hip and thigh muscles. Endurance is the ability to use your muscles for a long time, even after theyget tired. Straight leg raises, side-lying This exercise strengthens the muscles that rotate the leg at the hip and move it away from your body (hip abductors). Lie on your side with your left / right leg in the top position. Lie so your head, shoulder, knee, and hip line up. Bend your bottom knee to help you balance. Lift your top leg 4-6 inches (10-15 cm) while keeping your toes pointed straight ahead. Hold this position for __________ seconds. Slowly lower your leg to the starting position. Let your muscles relax completely after each repetition. Repeat __________ times. Complete this exercise __________ times a day. Hip abduction and rotation This is sometimes called quadruped (on hands and knees) exercises. Get on your hands and knees on a firm, lightly padded surface. Your hands should be directly below your shoulders, and your knees should be directly below your hips. Lift your left / right knee out to the side. Keep your knee bent. Do not twist your body. Hold this position for __________ seconds. Slowly lower your leg. Repeat __________ times. Complete this exercise __________ times a day. Straight leg raises, face-down This exercise stretches the muscles that move your hips away from the front of the pelvis (hip extensors). Lie on your abdomen on a bed or a firm surface with a pillow under your hips. Squeeze your buttocks muscles and lift your left / right leg about 4-6 inches (10-15 cm) off the bed. Do not let your back arch. Hold this position for __________ seconds. Slowly lower your leg to the starting position. Let your muscles relax completely after each repetition. Repeat __________ times. Complete this  exercise __________ times a day. This information is not intended to replace advice given to you by your health care provider. Make sure you discuss any questions you have with your healthcare provider. Document Revised: 04/10/2018 Document Reviewed: 10/10/2017 Elsevier Patient Education  2022 [employer].

## 2020-08-01 NOTE — Progress Notes (Signed)
Acute Office Visit  Subjective:    Patient ID: Allison Jimenez, female    DOB: 1950/06/08, 70 y.o.   MRN: [id number]  Chief Complaint  Patient presents with   Migraine   HPI Patient is in today for ocular migraine follow up. Pt has seen eye doctors, neurology and vascular doctors. Vascular physician saw pt for asymptomatic carotid artery stenosis BL. Repeat carotid US done by vascular wsa not as bad. Recommended continue aspirin and lipitor. ED visit on 07/11/2020:  MRI concerning for bleeding on brain and she was advised by neurology to go to ED.   Head/Neck CTA does not show any acute finding. No visible hemorrhage. Age related atrophy.  Aortic Atherosclerosis (ICD10-I70.0) and Emphysema (ICD10-J43.9).  Atherosclerotic disease affecting both common carotid arteries and carotid bifurcations, but without flow limiting stenosis.  Mild atherosclerotic calcification in the carotid siphon regions. No abnormality seen otherwise of the intracranial vasculature. No visible aneurysm or vascular malformation.  Pt has started synthroid 25 mcg 3 weeks ago. Due for repeat in 3 weeks.   Past Medical History:  Diagnosis Date   Allergy    ANXIETY 06/05/2006   Arthritis    SHOULDERS    BACK PAIN 01/05/2010   Cataract    bilateral, left worse   Centrilobular emphysema (HCC)    Centrilobular emphysema (HCC)    COPD (chronic obstructive pulmonary disease) (HCC)    DIVERTICULOSIS, COLON 06/05/2006   DIZZINESS OR VERTIGO 06/05/2006   positional   Hearing loss in left ear    HYPERLIPIDEMIA 06/05/2006   HYPOKALEMIA 12/14/2008   MENIERE'S DISEASE 06/25/2006   Migraine    TOBACCO USER 11/25/2007    Past Surgical History:  Procedure Laterality Date   APPENDECTOMY     CESAREAN SECTION     x1   CHOLECYSTECTOMY     COLONOSCOPY     COSMETIC SURGERY     mastoid surgery  1992   shunt in mastoid- endolymphatic sac in left ear    TUBAL LIGATION     vocal cord surgery     nodule x2    Family History   Problem Relation Age of Onset   Breast cancer Mother    Migraines Other    Depression Other    Anxiety disorder Other    High Cholesterol Other    Colon cancer Neg Hx    Rectal cancer Neg Hx    Stomach cancer Neg Hx    Colon polyps Neg Hx    Esophageal cancer Neg Hx     Social History   Socioeconomic History   Marital status: Divorced    Spouse name: Not on file   Number of children: 1   Years of education: 13   Highest education level: Not on file  Occupational History    Employer: [employer]    Comment: Retired  Tobacco Use   Smoking status: Every Day    Packs/day: 0.50    Years: 43.00    Pack years: 21.50    Types: Cigarettes   Smokeless tobacco: Never   Tobacco comments:    using electronic cigs.-12 cigs a day as well  Vaping Use   Vaping Use: Some days   Substances: Nicotine  Substance and Sexual Activity   Alcohol use: No    Alcohol/week: 0.0 standard drinks   Drug use: No   Sexual activity: Not on file  Other Topics Concern   Not on file  Social History Narrative   Patient lives at  home alone. Patient has a Data processing manager. Patient is divorced .   Patient is retired.   Education high school.   Right handed.   Caffeine none.   Social Determinants of Health   Financial Resource Strain: Not on file  Food Insecurity: Not on file  Transportation Needs: Not on file  Physical Activity: Not on file  Stress: Not on file  Social Connections: Not on file  Intimate Partner Violence: Not on file    Outpatient Medications Prior to Visit  Medication Sig Dispense Refill   albuterol (VENTOLIN HFA) 108 (90 Base) MCG/ACT inhaler Inhale into the lungs.     ALPRAZolam (XANAX) 0.25 MG tablet TAKE 1 TABLET(0.25 MG) BY MOUTH THREE TIMES DAILY 90 tablet 2   aspirin EC 325 MG tablet Take 325 mg by mouth daily.     atorvastatin (LIPITOR) 20 MG tablet TAKE 1 TABLET(20 MG) BY MOUTH DAILY 90 tablet 1   chlorthalidone (HYGROTON) 25 MG tablet TAKE 1  TABLET(25 MG) BY MOUTH EVERY DAY 90 tablet 4   cyclobenzaprine (FLEXERIL) 10 MG tablet Take by mouth.     diclofenac Sodium (VOLTAREN) 1 % GEL diclofenac 1 % topical gel 100 g 0   dicyclomine (BENTYL) 20 MG tablet dicyclomine 20 mg tablet  TK 1 T PO BID PRN     Erenumab-aooe (AIMOVIG) 140 MG/ML SOAJ Aimovig Autoinjector 140 mg/mL subcutaneous auto-injector  Inject by subcutaneous route.     FLUoxetine (PROZAC) 20 MG capsule Take 2 capsules (40 mg total) by mouth daily. 180 capsule 1   levothyroxine (SYNTHROID) 25 MCG tablet Take 1 tablet (25 mcg total) by mouth daily. 90 tablet 3   meloxicam (MOBIC) 7.5 MG tablet Take 7.5 mg by mouth daily.     nystatin-triamcinolone (MYCOLOG II) cream nystatin-triamcinolone 100,000 unit/g-0.1 % topical cream     ondansetron (ZOFRAN) 4 MG tablet TAKE 1 TABLET(4 MG) BY MOUTH EVERY 8 HOURS AS NEEDED FOR NAUSEA OR VOMITING 90 tablet 0   potassium chloride (MICRO-K) 10 MEQ CR capsule TAKE 1 CAPSULE(10 MEQ) BY MOUTH EVERY DAY WITH FOOD 90 capsule 0   promethazine (PHENERGAN) 25 MG tablet TAKE 1 TABLET BY MOUTH EVERY 8 HOURS IF NEEDED 20 tablet 0   rizatriptan (MAXALT) 10 MG tablet Take 1 tablet (10 mg total) by mouth as needed for migraine. May repeat in 2 hours if needed 10 tablet 0   traZODone (DESYREL) 50 MG tablet Take 1 tablet (50 mg total) by mouth at bedtime as needed for sleep. 30 tablet 3   montelukast (SINGULAIR) 10 MG tablet TAKE 1 TABLET(10 MG) BY MOUTH AT BEDTIME 90 tablet 2   No facility-administered medications prior to visit.    Allergies  Allergen Reactions   Erythromycin Base Diarrhea   Penicillins     Review of Systems  Constitutional:  Negative for chills, fatigue and fever.  HENT:  Negative for congestion, rhinorrhea and sore throat.   Eyes:  Positive for visual disturbance.  Respiratory:  Negative for cough and shortness of breath.   Cardiovascular:  Negative for chest pain.  Gastrointestinal:  Negative for abdominal pain,  constipation, diarrhea, nausea and vomiting.  Genitourinary:  Negative for dysuria and urgency.  Musculoskeletal:  Positive for arthralgias, back pain and myalgias.  Neurological:  Positive for headaches. Negative for dizziness, weakness and light-headedness.  Psychiatric/Behavioral:  Negative for dysphoric mood. The patient is not nervous/anxious.       Objective:    Physical Exam Vitals reviewed.  Constitutional:  Appearance: Normal appearance. She is normal weight.  HENT:     Right Ear: Tympanic membrane normal.     Left Ear: Tympanic membrane normal.     Nose: Nose normal.     Mouth/Throat:     Pharynx: No oropharyngeal exudate or posterior oropharyngeal erythema.  Neck:     Vascular: No carotid bruit.  Cardiovascular:     Rate and Rhythm: Normal rate and regular rhythm.     Pulses: Normal pulses.     Heart sounds: Normal heart sounds.  Pulmonary:     Effort: Pulmonary effort is normal. No respiratory distress.     Breath sounds: Normal breath sounds.  Neurological:     Mental Status: She is alert and oriented to person, place, and time.     Cranial Nerves: No cranial nerve deficit.  Psychiatric:        Mood and Affect: Mood normal.        Behavior: Behavior normal.    BP (!) 106/50 (BP Location: Right Arm, Patient Position: Sitting)   Pulse 88   Temp 97.8 F (36.6 C)   Resp 16   Ht 5' (1.524 m)   Wt 124 lb 6.4 oz (56.4 kg)   BMI 24.30 kg/m  Wt Readings from Last 3 Encounters:  08/01/20 124 lb 6.4 oz (56.4 kg)  07/26/20 123 lb (55.8 kg)  07/05/20 123 lb 3.2 oz (55.9 kg)    Health Maintenance Due  Topic Date Due   Zoster Vaccines- Shingrix (2 of 2) 06/22/2016   COVID-19 Vaccine (4 - Booster for Pfizer series) 02/15/2020   TETANUS/TDAP  05/17/2020   INFLUENZA VACCINE  08/01/2020    There are no preventive care reminders to display for this patient.   Lab Results  Component Value Date   TSH 9.910 (H) 07/05/2020   Lab Results  Component Value  Date   WBC 9.2 07/14/2020   HGB 12.0 07/14/2020   HCT 35.7 (L) 07/14/2020   MCV 85.0 07/14/2020   PLT 326 07/14/2020   Lab Results  Component Value Date   NA 130 (L) 07/05/2020   K 3.9 07/05/2020   CO2 29 07/05/2020   GLUCOSE 101 (H) 07/05/2020   BUN 9 07/05/2020   CREATININE 0.64 07/05/2020   BILITOT 0.3 07/05/2020   ALKPHOS 72 07/05/2020   AST 17 07/05/2020   ALT 11 07/05/2020   PROT 6.7 07/05/2020   ALBUMIN 4.4 07/05/2020   CALCIUM 9.8 07/05/2020   EGFR 96 07/05/2020   GFR 75.48 07/09/2016   Lab Results  Component Value Date   CHOL 129 07/05/2020   Lab Results  Component Value Date   HDL 48 07/05/2020   Lab Results  Component Value Date   LDLCALC 58 07/05/2020   Lab Results  Component Value Date   TRIG 132 07/05/2020   Lab Results  Component Value Date   CHOLHDL 2.7 07/05/2020   Lab Results  Component Value Date   HGBA1C 6.0 (H) 07/05/2020       Assessment & Plan:  1. Hypothyroidism (acquired) - TSH; Future in 3 weeks.  2. Ocular migraine Defer mgmt to neurology.  On aimovig.   3. Atherosclerosis of aorta (HCC)  Continue aspirin daily and lipitor 20 mg once daily.    Orders Placed This Encounter  Procedures   TSH     Follow-up: Return in about 3 weeks (around 08/22/2020) for lab visit. Marland Kitchen  An After Visit Summary was printed and given to the patient.  Elnita Maxwell  Zaiah Eckerson, MD [place] [phone]

## 2020-08-07 ENCOUNTER — Encounter: Payer: Self-pay | Admitting: Family Medicine

## 2020-08-12 DIAGNOSIS — M546 Pain in thoracic spine: Secondary | ICD-10-CM | POA: Diagnosis not present

## 2020-08-18 ENCOUNTER — Encounter: Payer: Self-pay | Admitting: Nurse Practitioner

## 2020-08-18 ENCOUNTER — Other Ambulatory Visit: Payer: Self-pay | Admitting: Nurse Practitioner

## 2020-08-18 ENCOUNTER — Ambulatory Visit: Payer: Medicare PPO | Admitting: Nurse Practitioner

## 2020-08-18 VITALS — BP 152/68 | HR 105 | Temp 97.8°F | Ht 60.0 in | Wt 122.0 lb

## 2020-08-18 DIAGNOSIS — R059 Cough, unspecified: Secondary | ICD-10-CM

## 2020-08-18 DIAGNOSIS — E039 Hypothyroidism, unspecified: Secondary | ICD-10-CM | POA: Diagnosis not present

## 2020-08-18 DIAGNOSIS — J028 Acute pharyngitis due to other specified organisms: Secondary | ICD-10-CM

## 2020-08-18 DIAGNOSIS — J441 Chronic obstructive pulmonary disease with (acute) exacerbation: Secondary | ICD-10-CM

## 2020-08-18 DIAGNOSIS — J029 Acute pharyngitis, unspecified: Secondary | ICD-10-CM | POA: Diagnosis not present

## 2020-08-18 LAB — POC COVID19 BINAXNOW: SARS Coronavirus 2 Ag: NEGATIVE

## 2020-08-18 LAB — POCT RAPID STREP A (OFFICE): Rapid Strep A Screen: NEGATIVE

## 2020-08-18 MED ORDER — ALBUTEROL SULFATE HFA 108 (90 BASE) MCG/ACT IN AERS
2.0000 | INHALATION_SPRAY | Freq: Four times a day (QID) | RESPIRATORY_TRACT | 0 refills | Status: DC | PRN
Start: 2020-08-18 — End: 2020-08-18

## 2020-08-18 MED ORDER — AZITHROMYCIN 250 MG PO TABS: ORAL_TABLET | ORAL | 0 refills | Status: AC

## 2020-08-18 MED ORDER — PROMETHAZINE-DM 6.25-15 MG/5ML PO SYRP: 5.0000 mL | ORAL_SOLUTION | Freq: Four times a day (QID) | ORAL | 0 refills | Status: DC | PRN

## 2020-08-18 MED ORDER — TRELEGY ELLIPTA 100-62.5-25 MCG/INH IN AEPB: 1.0000 | INHALATION_SPRAY | Freq: Every day | RESPIRATORY_TRACT | 0 refills | Status: DC

## 2020-08-18 NOTE — Patient Instructions (Addendum)
Take Z-pack as prescribed Replace toothbrush and toothpaste Use Trelegy inhaler once daily, rinse mouth daily Use Albuterol inhaler as needed for shortness of breath Warm salt water gargles as needed Use cough syrup as needed four times daily Seek emergency medical care for severe or concerning symptoms Notify office if symptoms fail to improve  Sinusitis, Adult Sinusitis is soreness and swelling (inflammation) of your sinuses. Sinuses are hollow spaces in the bones around your face. They are located: Around your eyes. In the middle of your forehead. Behind your nose. In your cheekbones. Your sinuses and nasal passages are lined with a fluid called mucus. Mucus drains out of your sinuses. Swelling can trap mucus in your sinuses. This lets germs (bacteria, virus, or fungus) grow, which leads to infection. Most of the time, this condition is caused bya virus. What are the causes? This condition is caused by: Allergies. Asthma. Germs. Things that block your nose or sinuses. Growths in the nose (nasal polyps). Chemicals or irritants in the air. Fungus (rare). What increases the risk? You are more likely to develop this condition if: You have a weak body defense system (immune system). You do a lot of swimming or diving. You use nasal sprays too much. You smoke. What are the signs or symptoms? The main symptoms of this condition are pain and a feeling of pressure around the sinuses. Other symptoms include: Stuffy nose (congestion). Runny nose (drainage). Swelling and warmth in the sinuses. Headache. Toothache. A cough that may get worse at night. Mucus that collects in the throat or the back of the nose (postnasal drip). Being unable to smell and taste. Being very tired (fatigue). A fever. Sore throat. Bad breath. How is this diagnosed? This condition is diagnosed based on: Your symptoms. Your medical history. A physical exam. Tests to find out if your condition is  short-term (acute) or long-term (chronic). Your doctor may: Check your nose for growths (polyps). Check your sinuses using a tool that has a light (endoscope). Check for allergies or germs. Do imaging tests, such as an MRI or CT scan. How is this treated? Treatment for this condition depends on the cause and whether it is short-term or long-term. If caused by a virus, your symptoms should go away on their own within 10 days. You may be given medicines to relieve symptoms. They include: Medicines that shrink swollen tissue in the nose. Medicines that treat allergies (antihistamines). A spray that treats swelling of the nostrils.  Rinses that help get rid of thick mucus in your nose (nasal saline washes). If caused by bacteria, your doctor may wait to see if you will get better without treatment. You may be given antibiotic medicine if you have: A very bad infection. A weak body defense system. If caused by growths in the nose, you may need to have surgery. Follow these instructions at home: Medicines Take, use, or apply over-the-counter and prescription medicines only as told by your doctor. These may include nasal sprays. If you were prescribed an antibiotic medicine, take it as told by your doctor. Do not stop taking the antibiotic even if you start to feel better. Hydrate and humidify  Drink enough water to keep your pee (urine) pale yellow. Use a cool mist humidifier to keep the humidity level in your home above 50%. Breathe in steam for 10-15 minutes, 3-4 times a day, or as told by your doctor. You can do this in the bathroom while a hot shower is running. Try not to spend  time in cool or dry air.  Rest Rest as much as you can. Sleep with your head raised (elevated). Make sure you get enough sleep each night. General instructions  Put a warm, moist washcloth on your face 3-4 times a day, or as often as told by your doctor. This will help with discomfort. Wash your hands often  with soap and water. If there is no soap and water, use hand sanitizer. Do not smoke. Avoid being around people who are smoking (secondhand smoke). Keep all follow-up visits as told by your doctor. This is important.  Contact a doctor if: You have a fever. Your symptoms get worse. Your symptoms do not get better within 10 days. Get help right away if: You have a very bad headache. You cannot stop throwing up (vomiting). You have very bad pain or swelling around your face or eyes. You have trouble seeing. You feel confused. Your neck is stiff. You have trouble breathing. Summary Sinusitis is swelling of your sinuses. Sinuses are hollow spaces in the bones around your face. This condition is caused by tissues in your nose that become inflamed or swollen. This traps germs. These can lead to infection. If you were prescribed an antibiotic medicine, take it as told by your doctor. Do not stop taking it even if you start to feel better. Keep all follow-up visits as told by your doctor. This is important. This information is not intended to replace advice given to you by your health care provider. Make sure you discuss any questions you have with your healthcare provider. Document Revised: 05/20/2017 Document Reviewed: 05/20/2017 Elsevier Patient Education  2022 [employer]. Chronic Obstructive Pulmonary Disease Exacerbation  Chronic obstructive pulmonary disease (COPD) is a long-term (chronic) lung problem. In COPD, the flow of air from the lungs is limited. COPD exacerbations are times that breathing gets worse and you need more than your normal treatment. Without treatment, they can be life-threatening. If theyhappen often, your lungs can become more damaged. What are the causes? Having infections that affect your airways and lungs. Being exposed to: Smoke. Air pollution. Chemical fumes. Dust. Things that can cause an allergic reaction (allergens). Not taking your usual COPD  medicines as told. Having medical problems already, such as heart failure or infections not involving the lungs. In many cases, the cause is not known. What increases the risk? Smoking. Being an older adult. Having frequent prior COPD exacerbations. What are the signs or symptoms? Increased coughing. Increased mucus from your lungs. Increased wheezing. Increased shortness of breath. Fast breathing and finding it hard to breathe. Chest tightness. Less energy than usual. Sleep disruption from symptoms. Confusion. Increased sleepiness. Often, these symptoms happen or get worse even with the use of medicines. How is this treated? Treatment for this condition depends on how bad it is and the cause of the symptoms. You may need to stay in the hospital for treatment. Treatment may include: Taking medicines. Using oxygen. Being treated with different ways to clear your airway, such as using a mask to deliver oxygen. Follow these instructions at home: Medicines Take over-the-counter and prescription medicines only as told by your doctor. Use all inhaled medicines the correct way. If you were prescribed an antibiotic or steroid medicine, take it as told by your doctor. Do not stop taking it even if you start to feel better. Lifestyle Do not smoke or use any products that contain nicotine or tobacco. If you need help quitting, ask your doctor. Eat healthy foods. Exercise  regularly. Get enough sleep. Most adults need 7 or more hours per night. Avoid tobacco smoke and other things that can bother your lungs. Several times a day, wash your hands with soap and water for at least 20 seconds. If you cannot use soap and water, use hand sanitizer. This may help keep you from getting an infection. During flu season, avoid areas that are crowded with people. General instructions Drink enough fluid to keep your pee (urine) pale yellow. Do not do this if your doctor has told you not to. Use a cool  mist machine (vaporizer). If you use oxygen or a machine that turns medicine into a mist (nebulizer), continue to use it as told. Keep all follow-up visits. How is this prevented? Keep up with shots (vaccinations) as told by your doctor. Be sure to get a yearly flu (influenza) shot. If you smoke, quit smoking. Smoking makes the problem worse. Follow all instructions for rehabilitation. These are steps you can take to make your body work better. Work with your doctor to develop and follow an action plan. This tells you what steps to take when you experience certain symptoms. Contact a doctor if: Your COPD symptoms get worse than normal. Get help right away if: You are short of breath and it gets worse, even when you are resting. You have trouble talking. You have chest pain. You cough up blood. You have a fever. You keep vomiting. You feel weak or you pass out (faint). You feel confused. You are not able to sleep because of your symptoms. You have trouble doing daily activities. These symptoms may be an emergency. Get help right away. Call your local emergency services (911 in the [place] Do not wait to see if the symptoms will go away. Do not drive yourself to the hospital. Summary COPD exacerbations are times that breathing gets worse and you need more treatment than normal. COPD exacerbations can be very serious and may cause your lungs to become more damaged. Do not smoke. If you need help quitting, ask your doctor. Stay up to date on your shots. Get a flu shot every year. This information is not intended to replace advice given to you by your health care provider. Make sure you discuss any questions you have with your healthcare provider. Document Revised: 11/11/2019 Document Reviewed: 10/27/2019 Elsevier Patient Education  2022 [employer].

## 2020-08-18 NOTE — Progress Notes (Signed)
Acute Office Visit  Subjective:    Patient ID: Allison Jimenez, female    DOB: 1950/02/10, 70 y.o.   MRN: [id number]  Chief Complaint  Patient presents with   Sore Throat    Chest congestion   HPI: Allison Jimenez is a 70 year old Caucasian female that presents with upper respiratory symptoms of post-nasal-drip, sore throat, and productive cough. Onset of symptoms was 3-days ago. Treatment has included chronic allergic rhinitis medications: Nasacort, Singulair, and OTC antihistamine. She tells me that she has a history of emphysema. Admits to smoking 15 cigarettes daily but has set a goal to decrease to 10. She denies known exposure to ill-contacts.She denies fever.    Past Medical History:  Diagnosis Date   Allergy    ANXIETY 06/05/2006   Arthritis    SHOULDERS    BACK PAIN 01/05/2010   Cataract    bilateral, left worse   Centrilobular emphysema (HCC)    Centrilobular emphysema (HCC)    COPD (chronic obstructive pulmonary disease) ([place])    DIVERTICULOSIS, COLON 06/05/2006   DIZZINESS OR VERTIGO 06/05/2006   positional   Hearing loss in left ear    HYPERLIPIDEMIA 06/05/2006   HYPOKALEMIA 12/14/2008   MENIERE'S DISEASE 06/25/2006   Migraine    TOBACCO USER 11/25/2007    Past Surgical History:  Procedure Laterality Date   APPENDECTOMY     CESAREAN SECTION     x1   CHOLECYSTECTOMY     COLONOSCOPY     COSMETIC SURGERY     mastoid surgery  1992   shunt in mastoid- endolymphatic sac in left ear    TUBAL LIGATION     vocal cord surgery     nodule x2    Family History  Problem Relation Age of Onset   Breast cancer Mother    Migraines Other    Depression Other    Anxiety disorder Other    High Cholesterol Other    Colon cancer Neg Hx    Rectal cancer Neg Hx    Stomach cancer Neg Hx    Colon polyps Neg Hx    Esophageal cancer Neg Hx     Social History   Socioeconomic History   Marital status: Divorced    Spouse name: Not on file   Number of children: 1   Years of education:  13   Highest education level: Not on file  Occupational History    Employer: [employer]    Comment: Retired  Tobacco Use   Smoking status: Every Day    Packs/day: 0.50    Years: 43.00    Pack years: 21.50    Types: Cigarettes   Smokeless tobacco: Never   Tobacco comments:    using electronic cigs.-12 cigs a day as well  Vaping Use   Vaping Use: Some days   Substances: Nicotine  Substance and Sexual Activity   Alcohol use: No    Alcohol/week: 0.0 standard drinks   Drug use: No   Sexual activity: Not on file  Other Topics Concern   Not on file  Social History Narrative   Patient lives at home alone. Patient has a Data processing manager. Patient is divorced .   Patient is retired.   Education high school.   Right handed.   Caffeine none.   Social Determinants of Health   Financial Resource Strain: Not on file  Food Insecurity: Not on file  Transportation Needs: Not on file  Physical Activity: Not on file  Stress: Not  on file  Social Connections: Not on file  Intimate Partner Violence: Not on file    Outpatient Medications Prior to Visit  Medication Sig Dispense Refill   albuterol (VENTOLIN HFA) 108 (90 Base) MCG/ACT inhaler Inhale into the lungs.     ALPRAZolam (XANAX) 0.25 MG tablet TAKE 1 TABLET(0.25 MG) BY MOUTH THREE TIMES DAILY 90 tablet 2   aspirin EC 325 MG tablet Take 325 mg by mouth daily.     atorvastatin (LIPITOR) 20 MG tablet TAKE 1 TABLET(20 MG) BY MOUTH DAILY 90 tablet 1   chlorthalidone (HYGROTON) 25 MG tablet TAKE 1 TABLET(25 MG) BY MOUTH EVERY DAY 90 tablet 4   cyclobenzaprine (FLEXERIL) 10 MG tablet Take by mouth.     diclofenac Sodium (VOLTAREN) 1 % GEL diclofenac 1 % topical gel 100 g 0   dicyclomine (BENTYL) 20 MG tablet dicyclomine 20 mg tablet  TK 1 T PO BID PRN     Erenumab-aooe (AIMOVIG) 140 MG/ML SOAJ Aimovig Autoinjector 140 mg/mL subcutaneous auto-injector  Inject by subcutaneous route.     FLUoxetine (PROZAC) 20 MG capsule  Take 2 capsules (40 mg total) by mouth daily. 180 capsule 1   levothyroxine (SYNTHROID) 25 MCG tablet Take 1 tablet (25 mcg total) by mouth daily. 90 tablet 3   meloxicam (MOBIC) 7.5 MG tablet Take 7.5 mg by mouth daily.     montelukast (SINGULAIR) 10 MG tablet TAKE 1 TABLET(10 MG) BY MOUTH AT BEDTIME 90 tablet 2   montelukast (SINGULAIR) 10 MG tablet TAKE 1 TABLET(10 MG) BY MOUTH AT BEDTIME 90 tablet 2   nystatin-triamcinolone (MYCOLOG II) cream nystatin-triamcinolone 100,000 unit/g-0.1 % topical cream     ondansetron (ZOFRAN) 4 MG tablet TAKE 1 TABLET(4 MG) BY MOUTH EVERY 8 HOURS AS NEEDED FOR NAUSEA OR VOMITING 90 tablet 0   potassium chloride (MICRO-K) 10 MEQ CR capsule TAKE 1 CAPSULE(10 MEQ) BY MOUTH EVERY DAY WITH FOOD 90 capsule 0   promethazine (PHENERGAN) 25 MG tablet TAKE 1 TABLET BY MOUTH EVERY 8 HOURS IF NEEDED 20 tablet 0   rizatriptan (MAXALT) 10 MG tablet Take 1 tablet (10 mg total) by mouth as needed for migraine. May repeat in 2 hours if needed 10 tablet 0   traZODone (DESYREL) 50 MG tablet Take 1 tablet (50 mg total) by mouth at bedtime as needed for sleep. 30 tablet 3   No facility-administered medications prior to visit.    Allergies  Allergen Reactions   Erythromycin Base Diarrhea   Penicillins     Review of Systems  Constitutional:  Negative for fatigue and fever.  HENT:  Positive for congestion, postnasal drip, rhinorrhea, sinus pressure, sinus pain, sore throat and voice change (hoarseness). Negative for ear pain.   Eyes:  Negative for pain.  Respiratory:  Positive for cough and shortness of breath. Negative for chest tightness and wheezing.   Cardiovascular:  Negative for chest pain and palpitations.  Gastrointestinal:  Negative for abdominal pain, constipation, diarrhea, nausea and vomiting.  Endocrine: Negative.   Genitourinary:  Negative for dysuria and hematuria.  Musculoskeletal:  Negative for arthralgias, back pain, joint swelling and myalgias.  Skin:   Negative for rash.  Allergic/Immunologic: Positive for environmental allergies.  Neurological:  Positive for headaches. Negative for dizziness and weakness.  Psychiatric/Behavioral:  Negative for dysphoric mood. The patient is not nervous/anxious.       Objective:    Physical Exam Vitals reviewed.  Constitutional:      Appearance: Normal appearance. She is well-developed.  HENT:  Right Ear: Tympanic membrane and external ear normal.     Left Ear: Tympanic membrane and external ear normal.     Nose:     Right Turbinates: Swollen.     Left Turbinates: Swollen.     Right Sinus: Maxillary sinus tenderness and frontal sinus tenderness present.     Left Sinus: Maxillary sinus tenderness and frontal sinus tenderness present.     Mouth/Throat:     Mouth: Mucous membranes are moist.     Pharynx: Posterior oropharyngeal erythema present.  Cardiovascular:     Rate and Rhythm: Regular rhythm. Tachycardia present.     Pulses: Normal pulses.     Heart sounds: Normal heart sounds.  Pulmonary:     Effort: Pulmonary effort is normal.     Breath sounds: Rhonchi present.  Abdominal:     General: Bowel sounds are normal.     Palpations: Abdomen is soft.  Musculoskeletal:        General: Normal range of motion.     Cervical back: Neck supple.  Skin:    General: Skin is warm and dry.     Capillary Refill: Capillary refill takes less than 2 seconds.  Neurological:     General: No focal deficit present.     Mental Status: She is alert and oriented to person, place, and time.  Psychiatric:        Mood and Affect: Mood normal.        Behavior: Behavior normal.        Thought Content: Thought content normal.        Judgment: Judgment normal.    BP (!) 152/68 (BP Location: Left Arm, Patient Position: Sitting)   Pulse (!) 105   Temp 97.8 F (36.6 C) (Temporal)   Ht 5' (1.524 m)   Wt 122 lb (55.3 kg)   SpO2 96%   BMI 23.83 kg/m  Wt Readings from Last 3 Encounters:  08/18/20 122 lb  (55.3 kg)  08/01/20 124 lb 6.4 oz (56.4 kg)  07/26/20 123 lb (55.8 kg)    Health Maintenance Due  Topic Date Due   Zoster Vaccines- Shingrix (2 of 2) 06/22/2016   COVID-19 Vaccine (4 - Booster for Pfizer series) 02/15/2020   TETANUS/TDAP  05/17/2020   INFLUENZA VACCINE  08/01/2020       Lab Results  Component Value Date   TSH 9.910 (H) 07/05/2020   Lab Results  Component Value Date   WBC 9.2 07/14/2020   HGB 12.0 07/14/2020   HCT 35.7 (L) 07/14/2020   MCV 85.0 07/14/2020   PLT 326 07/14/2020   Lab Results  Component Value Date   NA 130 (L) 07/05/2020   K 3.9 07/05/2020   CO2 29 07/05/2020   GLUCOSE 101 (H) 07/05/2020   BUN 9 07/05/2020   CREATININE 0.64 07/05/2020   BILITOT 0.3 07/05/2020   ALKPHOS 72 07/05/2020   AST 17 07/05/2020   ALT 11 07/05/2020   PROT 6.7 07/05/2020   ALBUMIN 4.4 07/05/2020   CALCIUM 9.8 07/05/2020   EGFR 96 07/05/2020   GFR 75.48 07/09/2016   Lab Results  Component Value Date   CHOL 129 07/05/2020   Lab Results  Component Value Date   HDL 48 07/05/2020   Lab Results  Component Value Date   LDLCALC 58 07/05/2020   Lab Results  Component Value Date   TRIG 132 07/05/2020   Lab Results  Component Value Date   CHOLHDL 2.7 07/05/2020   Lab Results  Component Value Date   HGBA1C 6.0 (H) 07/05/2020         Assessment & Plan:   1. COPD with exacerbation ([place]) - Fluticasone-Umeclidin-Vilant (TRELEGY ELLIPTA) 100-62.5-25 MCG/INH AEPB; Inhale 1 puff into the lungs daily.  Dispense: 1 each; Refill: 0 - albuterol (VENTOLIN HFA) 108 (90 Base) MCG/ACT inhaler; Inhale 2 puffs into the lungs every 6 (six) hours as needed for wheezing or shortness of breath.  Dispense: 8 g; Refill: 0  2. Pharyngitis due to other organism - azithromycin (ZITHROMAX) 250 MG tablet; Take 2 tablets on day 1, then 1 tablet daily on days 2 through 5  Dispense: 6 tablet; Refill: 0  3. Sore throat - POCT rapid strep A-negative  4. Cough -  promethazine-dextromethorphan (PROMETHAZINE-DM) 6.25-15 MG/5ML syrup; Take 5 mLs by mouth 4 (four) times daily as needed for cough.  Dispense: 118 mL; Refill: 0 - POC COVID-19-negative  5. Hypothyroidism (acquired) - TSH    Take Z-pack as prescribed Replace toothbrush and toothpaste Use Trelegy inhaler once daily, rinse mouth daily Use Albuterol inhaler as needed for shortness of breath Warm salt water gargles as needed Use cough syrup as needed four times daily Seek emergency medical care for severe or concerning symptoms Notify office if symptoms fail to improve  I,Lauren M Auman,acting as a Education administrator for [employer], NP.,have documented all relevant documentation on the behalf of Rip Harbour, NP,as directed by  Rip Harbour, NP while in the presence of Rip Harbour, NP.   I, Rip Harbour, NP, have reviewed all documentation for this visit. The documentation on 08/18/20 for the exam, diagnosis, procedures, and orders are all accurate and complete.

## 2020-08-19 ENCOUNTER — Telehealth: Payer: Self-pay

## 2020-08-19 DIAGNOSIS — J029 Acute pharyngitis, unspecified: Secondary | ICD-10-CM

## 2020-08-19 LAB — TSH: TSH: 1.32 u[IU]/mL (ref 0.450–4.500)

## 2020-08-19 NOTE — Telephone Encounter (Signed)
Pt calling stating she saw TSH online and it decreased from 9.910 to 1.32. States she would like to decrease dose of medication due to feeling jittery, palpitations, and feels "on a high." States she typically feels her best when TSH is between 2-3. Please advise.   [name], [place] 08/19/20 10:58 AM

## 2020-08-22 ENCOUNTER — Other Ambulatory Visit: Payer: Medicare PPO

## 2020-08-22 ENCOUNTER — Other Ambulatory Visit: Payer: Self-pay | Admitting: Family Medicine

## 2020-08-22 ENCOUNTER — Telehealth: Payer: Self-pay

## 2020-08-22 MED ORDER — CEFDINIR 250 MG/5ML PO SUSR: 300.0000 mg | Freq: Two times a day (BID) | ORAL | 0 refills | Status: DC

## 2020-08-22 NOTE — Telephone Encounter (Signed)
Patient called stating that her sore throat is not any better and seems to be getting worse. She was seen by shannon last Thursday and was given Zpack. Per Dr. Tobie Poet she will send patient in a different antibiotic to her pharmacy to help her sore throat.

## 2020-08-22 NOTE — Telephone Encounter (Signed)
Reviewed and agree.kc

## 2020-08-22 NOTE — Addendum Note (Signed)
Addended by: Alonna Minium on: 08/22/2020 10:56 AM   Modules accepted: Orders

## 2020-08-22 NOTE — Telephone Encounter (Signed)
Called pt. States penicillin reaction is rash. Pt states she has never taken cefdinir before. States she is having troubles swallowing and would like a medication that would be easier to swallow or be able to break in half. Asking if doxycycline would work. Wants tablet form.   Also pt still feels 25 mcg is too high. States she has been breaking this in half. Did again state what was recommended: 25 mcg once daily.  Spoke w/ Dr. Tobie Poet, states doxycycline does not treat what is needed. Will pt take liquid form of cefdinir? Pt can try 1/2 tablet of 25 mcg for 6 weeks w/ TSH recheck.    Called pt. Pt is ok w/ liquid of cefdinir. Pt would like to continue 1/2 tablet of 32mg daily. Scheduled lab visit for 9/26 for TSH recheck. Pt VU.   VRoyce Macadamia [place]08/22/22 10:48 AM

## 2020-08-23 ENCOUNTER — Encounter: Payer: Self-pay | Admitting: Family Medicine

## 2020-08-24 ENCOUNTER — Other Ambulatory Visit: Payer: Self-pay

## 2020-08-24 ENCOUNTER — Telehealth: Payer: Self-pay

## 2020-08-24 ENCOUNTER — Other Ambulatory Visit: Payer: Self-pay | Admitting: Family Medicine

## 2020-08-24 DIAGNOSIS — E039 Hypothyroidism, unspecified: Secondary | ICD-10-CM

## 2020-08-24 MED ORDER — NYSTATIN 100000 UNIT/ML MT SUSP: 5.0000 mL | Freq: Four times a day (QID) | OROMUCOSAL | 0 refills | Status: DC

## 2020-08-24 NOTE — Telephone Encounter (Signed)
Patient called stating that now since she has been on the new antibiotic that her tounge and mouth has white spots all over it. She is wanting to know if she can have a mouth wash to clear it up? Please advise, I also told the patient and reminded her that she needs to be drinking plenty of fluids and also to alternate either gatorade and pedialyte with the water she states that she is taking in every day.

## 2020-08-25 ENCOUNTER — Other Ambulatory Visit: Payer: Self-pay

## 2020-08-25 ENCOUNTER — Ambulatory Visit: Payer: Medicare PPO | Admitting: Family Medicine

## 2020-08-25 ENCOUNTER — Other Ambulatory Visit: Payer: Self-pay | Admitting: Family Medicine

## 2020-08-25 ENCOUNTER — Encounter: Payer: Self-pay | Admitting: Family Medicine

## 2020-08-25 VITALS — BP 130/60 | HR 120 | Temp 97.4°F | Resp 16 | Ht 60.5 in | Wt 117.0 lb

## 2020-08-25 DIAGNOSIS — I951 Orthostatic hypotension: Secondary | ICD-10-CM

## 2020-08-25 DIAGNOSIS — R000 Tachycardia, unspecified: Secondary | ICD-10-CM | POA: Diagnosis not present

## 2020-08-25 DIAGNOSIS — E039 Hypothyroidism, unspecified: Secondary | ICD-10-CM

## 2020-08-25 DIAGNOSIS — K921 Melena: Secondary | ICD-10-CM | POA: Diagnosis not present

## 2020-08-25 DIAGNOSIS — I714 Abdominal aortic aneurysm, without rupture: Secondary | ICD-10-CM | POA: Diagnosis not present

## 2020-08-25 DIAGNOSIS — E119 Type 2 diabetes mellitus without complications: Secondary | ICD-10-CM | POA: Diagnosis not present

## 2020-08-25 DIAGNOSIS — D509 Iron deficiency anemia, unspecified: Secondary | ICD-10-CM | POA: Diagnosis not present

## 2020-08-25 DIAGNOSIS — Z20822 Contact with and (suspected) exposure to covid-19: Secondary | ICD-10-CM | POA: Diagnosis not present

## 2020-08-25 DIAGNOSIS — I719 Aortic aneurysm of unspecified site, without rupture: Secondary | ICD-10-CM | POA: Diagnosis not present

## 2020-08-25 DIAGNOSIS — D72829 Elevated white blood cell count, unspecified: Secondary | ICD-10-CM | POA: Diagnosis not present

## 2020-08-25 DIAGNOSIS — J029 Acute pharyngitis, unspecified: Secondary | ICD-10-CM | POA: Diagnosis not present

## 2020-08-25 DIAGNOSIS — J449 Chronic obstructive pulmonary disease, unspecified: Secondary | ICD-10-CM | POA: Diagnosis not present

## 2020-08-25 DIAGNOSIS — R5383 Other fatigue: Secondary | ICD-10-CM | POA: Diagnosis not present

## 2020-08-25 DIAGNOSIS — R002 Palpitations: Secondary | ICD-10-CM | POA: Diagnosis not present

## 2020-08-25 DIAGNOSIS — B370 Candidal stomatitis: Secondary | ICD-10-CM | POA: Diagnosis not present

## 2020-08-25 LAB — COMPREHENSIVE METABOLIC PANEL
ALT: 35 IU/L — ABNORMAL HIGH (ref 0–32)
AST: 24 IU/L (ref 0–40)
Albumin/Globulin Ratio: 2.4 — ABNORMAL HIGH (ref 1.2–2.2)
Albumin: 4.3 g/dL (ref 3.8–4.8)
Alkaline Phosphatase: 57 IU/L (ref 44–121)
BUN/Creatinine Ratio: 23 (ref 12–28)
BUN: 14 mg/dL (ref 8–27)
Bilirubin Total: 0.2 mg/dL (ref 0.0–1.2)
CO2: 30 mmol/L — ABNORMAL HIGH (ref 20–29)
Calcium: 9.6 mg/dL (ref 8.7–10.3)
Chloride: 89 mmol/L — ABNORMAL LOW (ref 96–106)
Creatinine, Ser: 0.62 mg/dL (ref 0.57–1.00)
Globulin, Total: 1.8 g/dL (ref 1.5–4.5)
Glucose: 103 mg/dL — ABNORMAL HIGH (ref 65–99)
Potassium: 3.4 mmol/L — ABNORMAL LOW (ref 3.5–5.2)
Sodium: 128 mmol/L — ABNORMAL LOW (ref 134–144)
Total Protein: 6.1 g/dL (ref 6.0–8.5)
eGFR: 97 mL/min/{1.73_m2} (ref >59)

## 2020-08-25 LAB — CBC WITH DIFFERENTIAL/PLATELET
Basophils Absolute: 0.1 10*3/uL (ref 0.0–0.2)
Basos: 0 %
EOS (ABSOLUTE): 0.1 10*3/uL (ref 0.0–0.4)
Eos: 0 %
Hematocrit: 25.6 % — ABNORMAL LOW (ref 34.0–46.6)
Hemoglobin: 8.5 g/dL — ABNORMAL LOW (ref 11.1–15.9)
Immature Grans (Abs): 0.4 10*3/uL — ABNORMAL HIGH (ref 0.0–0.1)
Immature Granulocytes: 2 %
Lymphocytes Absolute: 2.7 10*3/uL (ref 0.7–3.1)
Lymphs: 10 %
MCH: 29.2 pg (ref 26.6–33.0)
MCHC: 33.2 g/dL (ref 31.5–35.7)
MCV: 88 fL (ref 79–97)
Monocytes Absolute: 1.9 10*3/uL — ABNORMAL HIGH (ref 0.1–0.9)
Monocytes: 7 %
Neutrophils Absolute: 21.7 10*3/uL — ABNORMAL HIGH (ref 1.4–7.0)
Neutrophils: 81 %
Platelets: 492 10*3/uL — ABNORMAL HIGH (ref 150–450)
RBC: 2.91 x10E6/uL — ABNORMAL LOW (ref 3.77–5.28)
RDW: 14.1 % (ref 11.7–15.4)
WBC: 26.8 10*3/uL (ref 3.4–10.8)

## 2020-08-25 LAB — HEMOCCULT GUIAC POC 1CARD (OFFICE)
Card #1 Date: 82522
Fecal Occult Blood, POC: POSITIVE — AB

## 2020-08-25 NOTE — Progress Notes (Addendum)
Acute Office Visit  Subjective:    Patient ID: Allison Jimenez, female    DOB: 20-Jan-1950, 70 y.o.   MRN: [id number]  Chief Complaint  Patient presents with   Sore Throat   Irregular Heart Beat    Pt presented today due to ongoing sore throat and irregular heart beat. Treated for strep with zpack 8/18, felt it was not helping and cefdinir was sent 8/22. Has two days left of cefdinir and feels it has not helped. Has had loose stools once a day described as dark brown, feels off when sitting to standing Denies urinary symptoms, n/v. Has not been able to eat as she normally would due to throat. Has been drinking 3-4 small bottles of water a day and eating popscicles, mashed potatoes, She kept young children over weekend and has upcoming trip to [place].     Past Medical History:  Diagnosis Date   Allergy    ANXIETY 06/05/2006   Arthritis    SHOULDERS    BACK PAIN 01/05/2010   Cataract    bilateral, left worse   Centrilobular emphysema (HCC)    Centrilobular emphysema (HCC)    COPD (chronic obstructive pulmonary disease) (HCC)    DIVERTICULOSIS, COLON 06/05/2006   DIZZINESS OR VERTIGO 06/05/2006   positional   Hearing loss in left ear    HYPERLIPIDEMIA 06/05/2006   HYPOKALEMIA 12/14/2008   MENIERE'S DISEASE 06/25/2006   Migraine    TOBACCO USER 11/25/2007    Past Surgical History:  Procedure Laterality Date   APPENDECTOMY     CESAREAN SECTION     x1   CHOLECYSTECTOMY     COLONOSCOPY     COSMETIC SURGERY     mastoid surgery  1992   shunt in mastoid- endolymphatic sac in left ear    TUBAL LIGATION     vocal cord surgery     nodule x2    Family History  Problem Relation Age of Onset   Breast cancer Mother    Migraines Other    Depression Other    Anxiety disorder Other    High Cholesterol Other    Colon cancer Neg Hx    Rectal cancer Neg Hx    Stomach cancer Neg Hx    Colon polyps Neg Hx    Esophageal cancer Neg Hx     Social History   Socioeconomic History    Marital status: Divorced    Spouse name: Not on file   Number of children: 1   Years of education: 13   Highest education level: Not on file  Occupational History    Employer: [employer]    Comment: Retired  Tobacco Use   Smoking status: Every Day    Packs/day: 0.50    Years: 43.00    Pack years: 21.50    Types: Cigarettes   Smokeless tobacco: Never   Tobacco comments:    using electronic cigs.-12 cigs a day as well  Vaping Use   Vaping Use: Some days   Substances: Nicotine  Substance and Sexual Activity   Alcohol use: No    Alcohol/week: 0.0 standard drinks   Drug use: No   Sexual activity: Not on file  Other Topics Concern   Not on file  Social History Narrative   Patient lives at home alone. Patient has a Biochemist, clinical. Patient is divorced .   Patient is retired.   Education high school.   Right handed.   Caffeine none.   Social  Determinants of Health   Financial Resource Strain: Not on file  Food Insecurity: Not on file  Transportation Needs: Not on file  Physical Activity: Not on file  Stress: Not on file  Social Connections: Not on file  Intimate Partner Violence: Not on file    Outpatient Medications Prior to Visit  Medication Sig Dispense Refill   albuterol (VENTOLIN HFA) 108 (90 Base) MCG/ACT inhaler Inhale into the lungs.     albuterol (VENTOLIN HFA) 108 (90 Base) MCG/ACT inhaler INHALE 2 PUFFS INTO THE LUNGS EVERY 6 HOURS AS NEEDED FOR WHEEZING OR SHORTNESS OF BREATH 20.1 g 1   ALPRAZolam (XANAX) 0.25 MG tablet TAKE 1 TABLET(0.25 MG) BY MOUTH THREE TIMES DAILY 90 tablet 1   aspirin EC 325 MG tablet Take 325 mg by mouth daily.     atorvastatin (LIPITOR) 20 MG tablet TAKE 1 TABLET(20 MG) BY MOUTH DAILY 90 tablet 1   cefdinir (OMNICEF) 250 MG/5ML suspension Take 6 mLs (300 mg total) by mouth 2 (two) times daily. 60 mL 0   chlorthalidone (HYGROTON) 25 MG tablet TAKE 1 TABLET(25 MG) BY MOUTH EVERY DAY 90 tablet 4   cyclobenzaprine  (FLEXERIL) 10 MG tablet Take by mouth.     diclofenac Sodium (VOLTAREN) 1 % GEL diclofenac 1 % topical gel 100 g 0   dicyclomine (BENTYL) 20 MG tablet dicyclomine 20 mg tablet  TK 1 T PO BID PRN     Erenumab-aooe (AIMOVIG) 140 MG/ML SOAJ Aimovig Autoinjector 140 mg/mL subcutaneous auto-injector  Inject by subcutaneous route.     FLUoxetine (PROZAC) 20 MG capsule Take 2 capsules (40 mg total) by mouth daily. 180 capsule 1   Fluticasone-Umeclidin-Vilant (TRELEGY ELLIPTA) 100-62.5-25 MCG/INH AEPB Inhale 1 puff into the lungs daily. 1 each 0   levothyroxine (SYNTHROID) 25 MCG tablet Take 1 tablet (25 mcg total) by mouth daily. 90 tablet 3   meloxicam (MOBIC) 7.5 MG tablet Take 7.5 mg by mouth daily.     montelukast (SINGULAIR) 10 MG tablet TAKE 1 TABLET(10 MG) BY MOUTH AT BEDTIME 90 tablet 2   montelukast (SINGULAIR) 10 MG tablet TAKE 1 TABLET(10 MG) BY MOUTH AT BEDTIME 90 tablet 2   nystatin (MYCOSTATIN) 100000 UNIT/ML suspension Take 5 mLs (500,000 Units total) by mouth 4 (four) times daily. Swish, gargle and spit x 1 week 473 mL 0   nystatin-triamcinolone (MYCOLOG II) cream nystatin-triamcinolone 100,000 unit/g-0.1 % topical cream     ondansetron (ZOFRAN) 4 MG tablet TAKE 1 TABLET(4 MG) BY MOUTH EVERY 8 HOURS AS NEEDED FOR NAUSEA OR VOMITING 90 tablet 0   potassium chloride (MICRO-K) 10 MEQ CR capsule TAKE 1 CAPSULE(10 MEQ) BY MOUTH EVERY DAY WITH FOOD 90 capsule 0   promethazine (PHENERGAN) 25 MG tablet TAKE 1 TABLET BY MOUTH EVERY 8 HOURS IF NEEDED 20 tablet 0   promethazine-dextromethorphan (PROMETHAZINE-DM) 6.25-15 MG/5ML syrup Take 5 mLs by mouth 4 (four) times daily as needed for cough. 118 mL 0   rizatriptan (MAXALT) 10 MG tablet Take 1 tablet (10 mg total) by mouth as needed for migraine. May repeat in 2 hours if needed 10 tablet 0   traZODone (DESYREL) 50 MG tablet Take 1 tablet (50 mg total) by mouth at bedtime as needed for sleep. 30 tablet 3   No facility-administered medications  prior to visit.    Allergies  Allergen Reactions   Erythromycin Base Diarrhea   Penicillins Rash    Review of Systems  Constitutional:  Positive for fatigue.  HENT:  Positive  for congestion, sore throat and tinnitus.   Respiratory:  Positive for shortness of breath.   Cardiovascular:  Positive for palpitations.  Neurological:  Positive for weakness and light-headedness.  Psychiatric/Behavioral:  Negative for behavioral problems.       Objective:    Physical Exam Vitals reviewed.  Constitutional:      General: She is not in acute distress.    Appearance: Normal appearance. She is normal weight.  HENT:     Right Ear: Tympanic membrane and ear canal normal.     Left Ear: Tympanic membrane and ear canal normal.     Nose: Nose normal. No congestion or rhinorrhea.     Mouth/Throat:     Pharynx: Posterior oropharyngeal erythema (tongue, throat. No exudate.) present.  Eyes:     Conjunctiva/sclera: Conjunctivae normal.  Neck:     Thyroid: No thyroid mass.     Vascular: No carotid bruit.  Cardiovascular:     Rate and Rhythm: Regular rhythm. Tachycardia present.     Heart sounds: Normal heart sounds. No murmur heard. Pulmonary:     Effort: Pulmonary effort is normal.     Breath sounds: Normal breath sounds.  Abdominal:     General: Bowel sounds are normal.     Palpations: Abdomen is soft. There is no mass.     Tenderness: There is no abdominal tenderness.  Musculoskeletal:     Right lower leg: No edema.     Left lower leg: No edema.  Neurological:     Mental Status: She is alert and oriented to person, place, and time.  Psychiatric:        Mood and Affect: Mood normal.        Behavior: Behavior normal.    BP 130/60   Pulse (!) 120   Temp (!) 97.4 F (36.3 C)   Resp 16   Ht 5' 0.5" (1.537 m)   Wt 117 lb (53.1 kg)   BMI 22.47 kg/m  Wt Readings from Last 3 Encounters:  08/25/20 117 lb (53.1 kg)  08/18/20 122 lb (55.3 kg)  08/01/20 124 lb 6.4 oz (56.4 kg)     Health Maintenance Due  Topic Date Due   Zoster Vaccines- Shingrix (2 of 2) 06/22/2016   COVID-19 Vaccine (4 - Booster for Pfizer series) 02/15/2020   TETANUS/TDAP  05/17/2020   INFLUENZA VACCINE  08/01/2020    There are no preventive care reminders to display for this patient.   Lab Results  Component Value Date   TSH 1.320 08/18/2020   Lab Results  Component Value Date   WBC 9.2 07/14/2020   HGB 12.0 07/14/2020   HCT 35.7 (L) 07/14/2020   MCV 85.0 07/14/2020   PLT 326 07/14/2020   Lab Results  Component Value Date   NA 130 (L) 07/05/2020   K 3.9 07/05/2020   CO2 29 07/05/2020   GLUCOSE 101 (H) 07/05/2020   BUN 9 07/05/2020   CREATININE 0.64 07/05/2020   BILITOT 0.3 07/05/2020   ALKPHOS 72 07/05/2020   AST 17 07/05/2020   ALT 11 07/05/2020   PROT 6.7 07/05/2020   ALBUMIN 4.4 07/05/2020   CALCIUM 9.8 07/05/2020   EGFR 96 07/05/2020   GFR 75.48 07/09/2016   Lab Results  Component Value Date   CHOL 129 07/05/2020   Lab Results  Component Value Date   HDL 48 07/05/2020   Lab Results  Component Value Date   LDLCALC 58 07/05/2020   Lab Results  Component Value Date  TRIG 132 07/05/2020   Lab Results  Component Value Date   CHOLHDL 2.7 07/05/2020   Lab Results  Component Value Date   HGBA1C 6.0 (H) 07/05/2020       Assessment & Plan:  1. Orthostatic hypotension - CBC with Differential/Platelet - Comprehensive metabolic panel  2. Sinus tachycardia - CBC with Differential/Platelet - Comprehensive metabolic panel - EKG 40-BTCY  3. Acquired hypothyroidism - T4, free - TSH  4. Melena - Hemoccult - 1 Card (office)   Based on results of labs, will consider IVF, Transfusion, Referral for holtor monitor, and adjusting thyroid medicines.   [facility] called: WBC 27,000 with immature wbcs.  Sending to [facility] [employer] to confirm.  No other labs were given to our staff. Rest of results are pending.  I was concerned she may have been anemic.   Pt was called and was sent to the ED by private vehicle. She was instructed to not drive herself.   Orders Placed This Encounter  Procedures   CBC with Differential/Platelet   Comprehensive metabolic panel   T4, free   TSH   Hemoccult - 1 Card (office)   EKG 12-Lead     Follow-up: Return if symptoms worsen or fail to improve (based on lab results).  An After Visit Summary was printed and given to the patient.  Rochel Brome, MD [facility] [phone]

## 2020-08-26 DIAGNOSIS — E039 Hypothyroidism, unspecified: Secondary | ICD-10-CM | POA: Diagnosis not present

## 2020-08-26 DIAGNOSIS — B370 Candidal stomatitis: Secondary | ICD-10-CM | POA: Diagnosis not present

## 2020-08-26 DIAGNOSIS — I714 Abdominal aortic aneurysm, without rupture: Secondary | ICD-10-CM | POA: Diagnosis not present

## 2020-08-26 LAB — TSH: TSH: 2.27 u[IU]/mL (ref 0.450–4.500)

## 2020-08-26 LAB — T4, FREE: Free T4: 0.96 ng/dL (ref 0.82–1.77)

## 2020-08-30 ENCOUNTER — Other Ambulatory Visit: Payer: Self-pay

## 2020-08-30 ENCOUNTER — Ambulatory Visit: Payer: Medicare PPO | Admitting: Family Medicine

## 2020-08-30 VITALS — BP 124/76 | HR 108 | Temp 97.4°F | Resp 16 | Wt 117.0 lb

## 2020-08-30 DIAGNOSIS — K921 Melena: Secondary | ICD-10-CM | POA: Diagnosis not present

## 2020-08-30 DIAGNOSIS — I7143 Infrarenal abdominal aortic aneurysm, without rupture: Secondary | ICD-10-CM

## 2020-08-30 DIAGNOSIS — D62 Acute posthemorrhagic anemia: Secondary | ICD-10-CM | POA: Diagnosis not present

## 2020-08-30 DIAGNOSIS — Z122 Encounter for screening for malignant neoplasm of respiratory organs: Secondary | ICD-10-CM | POA: Diagnosis not present

## 2020-08-30 DIAGNOSIS — I714 Abdominal aortic aneurysm, without rupture: Secondary | ICD-10-CM | POA: Diagnosis not present

## 2020-08-30 DIAGNOSIS — E039 Hypothyroidism, unspecified: Secondary | ICD-10-CM | POA: Diagnosis not present

## 2020-08-30 DIAGNOSIS — E876 Hypokalemia: Secondary | ICD-10-CM

## 2020-08-30 MED ORDER — NYSTATIN-TRIAMCINOLONE 100000-0.1 UNIT/GM-% EX CREA: TOPICAL_CREAM | CUTANEOUS | 0 refills | Status: DC

## 2020-08-30 MED ORDER — FLUCONAZOLE 150 MG PO TABS: 150.0000 mg | ORAL_TABLET | Freq: Once | ORAL | 0 refills | Status: AC

## 2020-08-30 MED ORDER — FLUOXETINE HCL 20 MG PO CAPS: 40.0000 mg | ORAL_CAPSULE | Freq: Every day | ORAL | 1 refills | Status: DC

## 2020-08-30 NOTE — Progress Notes (Signed)
Acute Office Visit  Subjective:    Patient ID: Allison Jimenez, female    DOB: [birth year], 70 y.o.   MRN: [id number]  Chief Complaint  Patient presents with   Anemia     HPI Patient is in today for emergency department visit follow-up.  Patient was sent to the hospital following an appointment with me on August 25, 2020.  The patient had been complaining of palpitations, sore throat, sore mouth, and nasal congestion.  She was quite convinced it was the Synthroid 25 mcg daily that she had started 6 weeks ago that was causing her symptoms.  She had discontinued this by the time I saw her during the prior week.  She had also reported dark stools at her appointment with me.  The patient was sent to the emergency department when her stat CBC showed hemoglobin was 8.7 and her white blood cell count was 27,000.  Patient was Hemoccult positive in my office although note not grossly. Lab work-up: Negative viral PCR.  I had treated the patient with Zithromax for possible sinusitis earlier in the week.  Patient had mildly elevated liver functions.  She was hypokalemic with a potassium of 2.7.  EKG showed normal sinus rhythm no ST changes.  Chest x-ray was normal. Hemoglobin dropped to 7.2.  Patient was transfused and hemoglobin was 8 upon discharge.  [facility] has no inpatient GI and no hospital was excepting transfers.  The patient had stabilized and therefore was felt to be safe for discharge from the emergency department.  Patient is scheduled to GI in approximately 1 week.  She feels much better.  No particular source of infection was found to account for her white count being elevated.  She does report the Synthroid I had given her had helped with her headaches and visual symptoms he been having.  The symptoms have been investigated by ophthalmology and thought to be ocular migraines.  She is very willing to restart this as she realizes her symptoms were not caused by this..  Past Medical  History:  Diagnosis Date   Allergy    ANXIETY 06/05/2006   Arthritis    SHOULDERS    BACK PAIN 01/05/2010   Cataract    bilateral, left worse   Centrilobular emphysema (HCC)    Centrilobular emphysema (HCC)    COPD (chronic obstructive pulmonary disease) ([place])    DIVERTICULOSIS, COLON 06/05/2006   DIZZINESS OR VERTIGO 06/05/2006   positional   Hearing loss in left ear    HYPERLIPIDEMIA 06/05/2006   HYPOKALEMIA 12/14/2008   MENIERE'S DISEASE 06/25/2006   Migraine    TOBACCO USER 11/25/2007    Past Surgical History:  Procedure Laterality Date   APPENDECTOMY     CESAREAN SECTION     x1   CHOLECYSTECTOMY     COLONOSCOPY     COSMETIC SURGERY     mastoid surgery  1992   shunt in mastoid- endolymphatic sac in left ear    TUBAL LIGATION     vocal cord surgery     nodule x2    Family History  Problem Relation Age of Onset   Breast cancer Mother    Migraines Other    Depression Other    Anxiety disorder Other    High Cholesterol Other    Colon cancer Neg Hx    Rectal cancer Neg Hx    Stomach cancer Neg Hx    Colon polyps Neg Hx    Esophageal cancer Neg Hx  Social History   Socioeconomic History   Marital status: Divorced    Spouse name: Not on file   Number of children: 1   Years of education: 13   Highest education level: Not on file  Occupational History    Employer: [employer]    Comment: Retired  Tobacco Use   Smoking status: Every Day    Packs/day: 0.50    Years: 43.00    Pack years: 21.50    Types: Cigarettes   Smokeless tobacco: Never   Tobacco comments:    using electronic cigs.-12 cigs a day as well  Vaping Use   Vaping Use: Some days   Substances: Nicotine  Substance and Sexual Activity   Alcohol use: No    Alcohol/week: 0.0 standard drinks   Drug use: No   Sexual activity: Not on file  Other Topics Concern   Not on file  Social History Narrative   Patient lives at home alone. Patient has a Data processing manager. Patient is  divorced .   Patient is retired.   Education high school.   Right handed.   Caffeine none.   Social Determinants of Health   Financial Resource Strain: Not on file  Food Insecurity: Not on file  Transportation Needs: Not on file  Physical Activity: Not on file  Stress: Not on file  Social Connections: Not on file  Intimate Partner Violence: Not on file    Outpatient Medications Prior to Visit  Medication Sig Dispense Refill   pantoprazole (PROTONIX) 40 MG tablet Take 40 mg by mouth daily.     albuterol (VENTOLIN HFA) 108 (90 Base) MCG/ACT inhaler Inhale into the lungs.     albuterol (VENTOLIN HFA) 108 (90 Base) MCG/ACT inhaler INHALE 2 PUFFS INTO THE LUNGS EVERY 6 HOURS AS NEEDED FOR WHEEZING OR SHORTNESS OF BREATH 20.1 g 1   ALPRAZolam (XANAX) 0.25 MG tablet TAKE 1 TABLET(0.25 MG) BY MOUTH THREE TIMES DAILY 90 tablet 1   aspirin EC 325 MG tablet Take 325 mg by mouth daily.     atorvastatin (LIPITOR) 20 MG tablet TAKE 1 TABLET(20 MG) BY MOUTH DAILY 90 tablet 1   cefdinir (OMNICEF) 250 MG/5ML suspension Take 6 mLs (300 mg total) by mouth 2 (two) times daily. 60 mL 0   chlorthalidone (HYGROTON) 25 MG tablet TAKE 1 TABLET(25 MG) BY MOUTH EVERY DAY 90 tablet 4   cyclobenzaprine (FLEXERIL) 10 MG tablet Take by mouth.     diclofenac Sodium (VOLTAREN) 1 % GEL diclofenac 1 % topical gel 100 g 0   dicyclomine (BENTYL) 20 MG tablet dicyclomine 20 mg tablet  TK 1 T PO BID PRN     Erenumab-aooe (AIMOVIG) 140 MG/ML SOAJ Aimovig Autoinjector 140 mg/mL subcutaneous auto-injector  Inject by subcutaneous route.     Fluticasone-Umeclidin-Vilant (TRELEGY ELLIPTA) 100-62.5-25 MCG/INH AEPB Inhale 1 puff into the lungs daily. 1 each 0   levothyroxine (SYNTHROID) 25 MCG tablet Take 1 tablet (25 mcg total) by mouth daily. 90 tablet 3   montelukast (SINGULAIR) 10 MG tablet TAKE 1 TABLET(10 MG) BY MOUTH AT BEDTIME 90 tablet 2   montelukast (SINGULAIR) 10 MG tablet TAKE 1 TABLET(10 MG) BY MOUTH AT BEDTIME  90 tablet 2   nystatin (MYCOSTATIN) 100000 UNIT/ML suspension Take 5 mLs (500,000 Units total) by mouth 4 (four) times daily. Swish, gargle and spit x 1 week 473 mL 0   ondansetron (ZOFRAN) 4 MG tablet TAKE 1 TABLET(4 MG) BY MOUTH EVERY 8 HOURS AS NEEDED FOR  NAUSEA OR VOMITING 90 tablet 0   potassium chloride (MICRO-K) 10 MEQ CR capsule TAKE 1 CAPSULE(10 MEQ) BY MOUTH EVERY DAY WITH FOOD 90 capsule 0   promethazine (PHENERGAN) 25 MG tablet TAKE 1 TABLET BY MOUTH EVERY 8 HOURS IF NEEDED 20 tablet 0   rizatriptan (MAXALT) 10 MG tablet Take 1 tablet (10 mg total) by mouth as needed for migraine. May repeat in 2 hours if needed 10 tablet 0   traZODone (DESYREL) 50 MG tablet Take 1 tablet (50 mg total) by mouth at bedtime as needed for sleep. 30 tablet 3   FLUoxetine (PROZAC) 20 MG capsule Take 2 capsules (40 mg total) by mouth daily. 180 capsule 1   meloxicam (MOBIC) 7.5 MG tablet Take 7.5 mg by mouth daily.     nystatin-triamcinolone (MYCOLOG II) cream nystatin-triamcinolone 100,000 unit/g-0.1 % topical cream     promethazine-dextromethorphan (PROMETHAZINE-DM) 6.25-15 MG/5ML syrup Take 5 mLs by mouth 4 (four) times daily as needed for cough. 118 mL 0   No facility-administered medications prior to visit.    Allergies  Allergen Reactions   Erythromycin Base Diarrhea   Penicillins Rash    Review of Systems  Constitutional:  Positive for fatigue. Negative for chills and fever.  HENT:  Negative for congestion.        Mouth ulcers  Respiratory:  Negative for cough and shortness of breath.   Cardiovascular:  Negative for chest pain and palpitations.  Gastrointestinal:  Negative for abdominal pain, nausea and vomiting.  Genitourinary:  Negative for dysuria.       Vaginal itching   Psychiatric/Behavioral:  Negative for dysphoric mood. The patient is not nervous/anxious.       Objective:    Physical Exam Vitals reviewed.  Constitutional:      Appearance: Normal appearance. She is normal  weight.  Neck:     Vascular: No carotid bruit.  Cardiovascular:     Rate and Rhythm: Normal rate and regular rhythm.     Pulses: Normal pulses.     Heart sounds: Normal heart sounds.  Pulmonary:     Effort: Pulmonary effort is normal. No respiratory distress.     Breath sounds: Normal breath sounds.  Abdominal:     General: Abdomen is flat. Bowel sounds are normal.     Palpations: Abdomen is soft.     Tenderness: There is no abdominal tenderness.  Neurological:     Mental Status: She is alert and oriented to person, place, and time.  Psychiatric:        Mood and Affect: Mood normal.        Behavior: Behavior normal.    BP 124/76   Pulse (!) 108   Temp (!) 97.4 F (36.3 C)   Resp 16   Wt 117 lb (53.1 kg)   BMI 22.47 kg/m  Wt Readings from Last 3 Encounters:  08/30/20 117 lb (53.1 kg)  08/25/20 117 lb (53.1 kg)  08/18/20 122 lb (55.3 kg)    Health Maintenance Due  Topic Date Due   Zoster Vaccines- Shingrix (2 of 2) 06/22/2016   COVID-19 Vaccine (4 - Booster for Pfizer series) 02/15/2020   TETANUS/TDAP  05/17/2020   INFLUENZA VACCINE  08/01/2020    There are no preventive care reminders to display for this patient.   Lab Results  Component Value Date   TSH 5.650 (H) 08/30/2020   Lab Results  Component Value Date   WBC 14.6 (H) 08/30/2020   HGB 11.3 08/30/2020  HCT 33.6 (L) 08/30/2020   MCV 90 08/30/2020   PLT 474 (H) 08/30/2020   Lab Results  Component Value Date   NA 130 (L) 08/30/2020   K 3.5 08/30/2020   CO2 23 08/30/2020   GLUCOSE 97 08/30/2020   BUN 11 08/30/2020   CREATININE 0.62 08/30/2020   BILITOT 0.3 08/30/2020   ALKPHOS 57 08/30/2020   AST 17 08/30/2020   ALT 33 (H) 08/30/2020   PROT 6.0 08/30/2020   ALBUMIN 4.1 08/30/2020   CALCIUM 9.5 08/30/2020   EGFR 97 08/30/2020   GFR 75.48 07/09/2016   Lab Results  Component Value Date   CHOL 129 07/05/2020   Lab Results  Component Value Date   HDL 48 07/05/2020   Lab Results   Component Value Date   LDLCALC 58 07/05/2020   Lab Results  Component Value Date   TRIG 132 07/05/2020   Lab Results  Component Value Date   CHOLHDL 2.7 07/05/2020   Lab Results  Component Value Date   HGBA1C 6.0 (H) 07/05/2020       Assessment & Plan:  1. Acute blood loss anemia (ABLA) Secondary to GI bleed.  Keep follow-up with gastroenterology. Recommended iron sulfate 325 mg once daily. - CBC with Differential/Platelet  2. Hypothyroidism (acquired) Restart Synthroid 25 mcg once daily. - T4, free - TSH  3. Hypokalemia - Comprehensive metabolic panel  4. Encounter for screening for lung cancer - FLUoxetine (PROZAC) 20 MG capsule; Take 2 capsules (40 mg total) by mouth daily.  Dispense: 180 capsule; Refill: 1  5. Melena Secondary to GI bleed.  Has resolved.  6. Aneurysm of infrarenal abdominal aorta (HCC)  Check every 2 years.  Found during her ED visit.  I will need to get more reports  Follow-up: Return in about 6 weeks (around 10/14/2020).  Fasting.  An After Visit Summary was printed and given to the patient.  Rochel Brome, MD [facility] [phone]

## 2020-08-31 LAB — CBC WITH DIFFERENTIAL/PLATELET
Basophils Absolute: 0 10*3/uL (ref 0.0–0.2)
Basos: 0 %
EOS (ABSOLUTE): 0.2 10*3/uL (ref 0.0–0.4)
Eos: 1 %
Hematocrit: 33.6 % — ABNORMAL LOW (ref 34.0–46.6)
Hemoglobin: 11.3 g/dL (ref 11.1–15.9)
Immature Grans (Abs): 0.1 10*3/uL (ref 0.0–0.1)
Immature Granulocytes: 1 %
Lymphocytes Absolute: 1.9 10*3/uL (ref 0.7–3.1)
Lymphs: 13 %
MCH: 30.1 pg (ref 26.6–33.0)
MCHC: 33.6 g/dL (ref 31.5–35.7)
MCV: 90 fL (ref 79–97)
Monocytes Absolute: 1.2 10*3/uL — ABNORMAL HIGH (ref 0.1–0.9)
Monocytes: 8 %
Neutrophils Absolute: 11.2 10*3/uL — ABNORMAL HIGH (ref 1.4–7.0)
Neutrophils: 77 %
Platelets: 474 10*3/uL — ABNORMAL HIGH (ref 150–450)
RBC: 3.75 x10E6/uL — ABNORMAL LOW (ref 3.77–5.28)
RDW: 14 % (ref 11.7–15.4)
WBC: 14.6 10*3/uL — ABNORMAL HIGH (ref 3.4–10.8)

## 2020-08-31 LAB — COMPREHENSIVE METABOLIC PANEL
ALT: 33 IU/L — ABNORMAL HIGH (ref 0–32)
AST: 17 IU/L (ref 0–40)
Albumin/Globulin Ratio: 2.2 (ref 1.2–2.2)
Albumin: 4.1 g/dL (ref 3.8–4.8)
Alkaline Phosphatase: 57 IU/L (ref 44–121)
BUN/Creatinine Ratio: 18 (ref 12–28)
BUN: 11 mg/dL (ref 8–27)
Bilirubin Total: 0.3 mg/dL (ref 0.0–1.2)
CO2: 23 mmol/L (ref 20–29)
Calcium: 9.5 mg/dL (ref 8.7–10.3)
Chloride: 91 mmol/L — ABNORMAL LOW (ref 96–106)
Creatinine, Ser: 0.62 mg/dL (ref 0.57–1.00)
Globulin, Total: 1.9 g/dL (ref 1.5–4.5)
Glucose: 97 mg/dL (ref 65–99)
Potassium: 3.5 mmol/L (ref 3.5–5.2)
Sodium: 130 mmol/L — ABNORMAL LOW (ref 134–144)
Total Protein: 6 g/dL (ref 6.0–8.5)
eGFR: 97 mL/min/{1.73_m2} (ref >59)

## 2020-08-31 LAB — TSH: TSH: 5.65 u[IU]/mL — ABNORMAL HIGH (ref 0.450–4.500)

## 2020-08-31 LAB — T4, FREE: Free T4: 0.97 ng/dL (ref 0.82–1.77)

## 2020-09-01 ENCOUNTER — Encounter: Payer: Self-pay | Admitting: Family Medicine

## 2020-09-02 ENCOUNTER — Telehealth: Payer: Self-pay

## 2020-09-02 NOTE — Telephone Encounter (Signed)
Pt calling states she has centrum silver liquid form and would like to take this. It does have iron 9 mg. Pt wants to make sure this will be appropriate for her. Please advise.   [name], [place] 09/02/20 10:08 AM

## 2020-09-02 NOTE — Telephone Encounter (Signed)
Called pt. Pt VU.   Allison Jimenez, [place] 09/02/20 10:50 AM

## 2020-09-05 ENCOUNTER — Encounter: Payer: Self-pay | Admitting: Family Medicine

## 2020-09-05 DIAGNOSIS — I714 Abdominal aortic aneurysm, without rupture: Secondary | ICD-10-CM | POA: Insufficient documentation

## 2020-09-05 DIAGNOSIS — I7143 Infrarenal abdominal aortic aneurysm, without rupture: Secondary | ICD-10-CM | POA: Insufficient documentation

## 2020-09-05 DIAGNOSIS — D62 Acute posthemorrhagic anemia: Secondary | ICD-10-CM | POA: Insufficient documentation

## 2020-09-05 HISTORY — DX: Acute posthemorrhagic anemia: D62

## 2020-09-06 ENCOUNTER — Telehealth: Payer: Self-pay

## 2020-09-06 NOTE — Telephone Encounter (Signed)
Patient called and stated she wants to try the name brand Synthroid instead of getting the generic brand. She has already looked up the cost and would like for it to be sent to [facility] on [address], please only send in 30 days supply. Is it  ok to send?

## 2020-09-07 ENCOUNTER — Other Ambulatory Visit: Payer: Self-pay | Admitting: Family Medicine

## 2020-09-07 ENCOUNTER — Encounter: Payer: Self-pay | Admitting: Family Medicine

## 2020-09-07 DIAGNOSIS — D649 Anemia, unspecified: Secondary | ICD-10-CM | POA: Diagnosis not present

## 2020-09-07 DIAGNOSIS — K5900 Constipation, unspecified: Secondary | ICD-10-CM | POA: Diagnosis not present

## 2020-09-07 DIAGNOSIS — K921 Melena: Secondary | ICD-10-CM | POA: Diagnosis not present

## 2020-09-07 MED ORDER — SYNTHROID 25 MCG PO TABS: 25.0000 ug | ORAL_TABLET | Freq: Every day | ORAL | 0 refills | Status: DC

## 2020-09-07 NOTE — Telephone Encounter (Signed)
Pt sent mychart message with the following info:  "I continue to have bad side effects with the generic levothyroxine.  I would like to try the original brand name Synthroid .  I know a 30 day supply will cost me $46 a month.  I am okay with paying that.  I called [facility] on [address] they do not have the 25 mg in stock but can order it & have it by tomorrow.  The pharmacy said to designate NO generic.  I see the stomach doctor in [place] today."  Please advise.   Allison Jimenez, [place] 09/07/20 1:24 PM

## 2020-09-08 NOTE — Telephone Encounter (Signed)
Sent pt mychart message with update.   Allison Jimenez, [place] 09/08/20 7:28 AM

## 2020-09-09 DIAGNOSIS — K921 Melena: Secondary | ICD-10-CM | POA: Diagnosis not present

## 2020-09-09 DIAGNOSIS — K222 Esophageal obstruction: Secondary | ICD-10-CM | POA: Diagnosis not present

## 2020-09-09 DIAGNOSIS — K449 Diaphragmatic hernia without obstruction or gangrene: Secondary | ICD-10-CM | POA: Diagnosis not present

## 2020-09-09 DIAGNOSIS — J439 Emphysema, unspecified: Secondary | ICD-10-CM | POA: Diagnosis not present

## 2020-09-09 DIAGNOSIS — K2289 Other specified disease of esophagus: Secondary | ICD-10-CM | POA: Diagnosis not present

## 2020-09-09 DIAGNOSIS — K2101 Gastro-esophageal reflux disease with esophagitis, with bleeding: Secondary | ICD-10-CM | POA: Diagnosis not present

## 2020-09-09 DIAGNOSIS — D649 Anemia, unspecified: Secondary | ICD-10-CM | POA: Diagnosis not present

## 2020-09-09 DIAGNOSIS — H8109 Meniere's disease, unspecified ear: Secondary | ICD-10-CM | POA: Diagnosis not present

## 2020-09-09 DIAGNOSIS — K259 Gastric ulcer, unspecified as acute or chronic, without hemorrhage or perforation: Secondary | ICD-10-CM | POA: Diagnosis not present

## 2020-09-09 DIAGNOSIS — E039 Hypothyroidism, unspecified: Secondary | ICD-10-CM | POA: Diagnosis not present

## 2020-09-09 DIAGNOSIS — R1310 Dysphagia, unspecified: Secondary | ICD-10-CM | POA: Diagnosis not present

## 2020-09-09 DIAGNOSIS — K219 Gastro-esophageal reflux disease without esophagitis: Secondary | ICD-10-CM | POA: Diagnosis not present

## 2020-09-16 ENCOUNTER — Other Ambulatory Visit: Payer: Self-pay | Admitting: Nurse Practitioner

## 2020-09-16 ENCOUNTER — Encounter: Payer: Self-pay | Admitting: Nurse Practitioner

## 2020-09-16 ENCOUNTER — Ambulatory Visit: Payer: Medicare PPO | Admitting: Nurse Practitioner

## 2020-09-16 ENCOUNTER — Other Ambulatory Visit: Payer: Self-pay

## 2020-09-16 VITALS — BP 124/74 | HR 92 | Temp 96.7°F | Ht 60.5 in | Wt 114.0 lb

## 2020-09-16 DIAGNOSIS — R059 Cough, unspecified: Secondary | ICD-10-CM

## 2020-09-16 DIAGNOSIS — D62 Acute posthemorrhagic anemia: Secondary | ICD-10-CM

## 2020-09-16 DIAGNOSIS — J029 Acute pharyngitis, unspecified: Secondary | ICD-10-CM

## 2020-09-16 DIAGNOSIS — E039 Hypothyroidism, unspecified: Secondary | ICD-10-CM

## 2020-09-16 DIAGNOSIS — J441 Chronic obstructive pulmonary disease with (acute) exacerbation: Secondary | ICD-10-CM | POA: Diagnosis not present

## 2020-09-16 DIAGNOSIS — J0181 Other acute recurrent sinusitis: Secondary | ICD-10-CM | POA: Diagnosis not present

## 2020-09-16 LAB — POC COVID19 BINAXNOW: SARS Coronavirus 2 Ag: NEGATIVE

## 2020-09-16 LAB — POCT RAPID STREP A (OFFICE): Rapid Strep A Screen: NEGATIVE

## 2020-09-16 MED ORDER — PROMETHAZINE-DM 6.25-15 MG/5ML PO SYRP: 5.0000 mL | ORAL_SOLUTION | Freq: Four times a day (QID) | ORAL | 0 refills | Status: DC | PRN

## 2020-09-16 MED ORDER — CLARITHROMYCIN 250 MG/5ML PO SUSR: 500.0000 mg | Freq: Two times a day (BID) | ORAL | 0 refills | Status: DC

## 2020-09-16 MED ORDER — FLUTICASONE PROPIONATE 50 MCG/ACT NA SUSP
2.0000 | Freq: Every day | NASAL | 6 refills | Status: DC
Start: 2020-09-16 — End: 2021-04-24

## 2020-09-16 MED ORDER — CLARITHROMYCIN 250 MG PO TABS: 250.0000 mg | ORAL_TABLET | Freq: Two times a day (BID) | ORAL | 0 refills | Status: DC

## 2020-09-16 NOTE — Progress Notes (Signed)
Acute Office Visit  Subjective:    Patient ID: Allison Jimenez, female    DOB: 12-05-1950, 70 y.o.   MRN: [id number]  Chief Complaint  Patient presents with   URI    HPI Allison Jimenez is a 70 year old Caucasian female that presents for evaluation of URI symptoms of sinus drainage, post-nasal-drip, hoarseness, cough with productive white sputum. Denies fever or dyspnea. She has COPD and is currently a cigarette smoker. Onset was 2-days ago. Denies previous treatments tried at home. She tells me that she had an EGD with esophageal dilation this week with Dr Lyda Jester. She was hospitalized 3-weeks ago for anemia at [facility]. She was found to have gastric ulcers and received 2 units PRBCs. She has requested CBC and TSH labs today to follow-up on recent abnormal values. She is excited to be going to [place] tomorrow to visit with her adult son for 10 days. Past Medical History:  Diagnosis Date   Allergy    ANXIETY 06/05/2006   Arthritis    SHOULDERS    BACK PAIN 01/05/2010   Cataract    bilateral, left worse   Centrilobular emphysema (HCC)    Centrilobular emphysema (HCC)    COPD (chronic obstructive pulmonary disease) ([place])    DIVERTICULOSIS, COLON 06/05/2006   DIZZINESS OR VERTIGO 06/05/2006   positional   Hearing loss in left ear    HYPERLIPIDEMIA 06/05/2006   HYPOKALEMIA 12/14/2008   MENIERE'S DISEASE 06/25/2006   Migraine    TOBACCO USER 11/25/2007    Past Surgical History:  Procedure Laterality Date   APPENDECTOMY     CESAREAN SECTION     x1   CHOLECYSTECTOMY     COLONOSCOPY     COSMETIC SURGERY     mastoid surgery  1992   shunt in mastoid- endolymphatic sac in left ear    TUBAL LIGATION     vocal cord surgery     nodule x2    Family History  Problem Relation Age of Onset   Breast cancer Mother    Migraines Other    Depression Other    Anxiety disorder Other    High Cholesterol Other    Colon cancer Neg Hx    Rectal cancer Neg Hx    Stomach cancer Neg Hx    Colon  polyps Neg Hx    Esophageal cancer Neg Hx     Social History   Socioeconomic History   Marital status: Divorced    Spouse name: Not on file   Number of children: 1   Years of education: 13   Highest education level: Not on file  Occupational History    Employer: [employer]    Comment: Retired  Tobacco Use   Smoking status: Every Day    Packs/day: 0.50    Years: 43.00    Pack years: 21.50    Types: Cigarettes   Smokeless tobacco: Never   Tobacco comments:    using electronic cigs.-12 cigs a day as well  Vaping Use   Vaping Use: Some days   Substances: Nicotine  Substance and Sexual Activity   Alcohol use: No    Alcohol/week: 0.0 standard drinks   Drug use: No   Sexual activity: Not on file  Other Topics Concern   Not on file  Social History Narrative   Patient lives at home alone. Patient has a Data processing manager. Patient is divorced .   Patient is retired.   Education high school.   Right handed.  Caffeine none.   Social Determinants of Health   Financial Resource Strain: Not on file  Food Insecurity: Not on file  Transportation Needs: Not on file  Physical Activity: Not on file  Stress: Not on file  Social Connections: Not on file  Intimate Partner Violence: Not on file    Outpatient Medications Prior to Visit  Medication Sig Dispense Refill   albuterol (VENTOLIN HFA) 108 (90 Base) MCG/ACT inhaler Inhale into the lungs.     albuterol (VENTOLIN HFA) 108 (90 Base) MCG/ACT inhaler INHALE 2 PUFFS INTO THE LUNGS EVERY 6 HOURS AS NEEDED FOR WHEEZING OR SHORTNESS OF BREATH 20.1 g 1   ALPRAZolam (XANAX) 0.25 MG tablet TAKE 1 TABLET(0.25 MG) BY MOUTH THREE TIMES DAILY 90 tablet 1   aspirin EC 325 MG tablet Take 325 mg by mouth daily.     atorvastatin (LIPITOR) 20 MG tablet TAKE 1 TABLET(20 MG) BY MOUTH DAILY 90 tablet 1   chlorthalidone (HYGROTON) 25 MG tablet TAKE 1 TABLET(25 MG) BY MOUTH EVERY DAY 90 tablet 4   cyclobenzaprine (FLEXERIL) 10 MG  tablet Take by mouth.     diclofenac Sodium (VOLTAREN) 1 % GEL diclofenac 1 % topical gel 100 g 0   dicyclomine (BENTYL) 20 MG tablet dicyclomine 20 mg tablet  TK 1 T PO BID PRN     Erenumab-aooe (AIMOVIG) 140 MG/ML SOAJ Aimovig Autoinjector 140 mg/mL subcutaneous auto-injector  Inject by subcutaneous route.     FLUoxetine (PROZAC) 20 MG capsule Take 2 capsules (40 mg total) by mouth daily. 180 capsule 1   Fluticasone-Umeclidin-Vilant (TRELEGY ELLIPTA) 100-62.5-25 MCG/INH AEPB Inhale 1 puff into the lungs daily. 1 each 0   montelukast (SINGULAIR) 10 MG tablet TAKE 1 TABLET(10 MG) BY MOUTH AT BEDTIME 90 tablet 2   montelukast (SINGULAIR) 10 MG tablet TAKE 1 TABLET(10 MG) BY MOUTH AT BEDTIME 90 tablet 2   nystatin (MYCOSTATIN) 100000 UNIT/ML suspension Take 5 mLs (500,000 Units total) by mouth 4 (four) times daily. Swish, gargle and spit x 1 week 473 mL 0   nystatin-triamcinolone (MYCOLOG II) cream Apply twice a day prn 60 g 0   ondansetron (ZOFRAN) 4 MG tablet TAKE 1 TABLET(4 MG) BY MOUTH EVERY 8 HOURS AS NEEDED FOR NAUSEA OR VOMITING 90 tablet 0   pantoprazole (PROTONIX) 40 MG tablet Take 40 mg by mouth daily.     potassium chloride (MICRO-K) 10 MEQ CR capsule TAKE 1 CAPSULE(10 MEQ) BY MOUTH EVERY DAY WITH FOOD 90 capsule 0   promethazine (PHENERGAN) 25 MG tablet TAKE 1 TABLET BY MOUTH EVERY 8 HOURS IF NEEDED 20 tablet 0   rizatriptan (MAXALT) 10 MG tablet Take 1 tablet (10 mg total) by mouth as needed for migraine. May repeat in 2 hours if needed 10 tablet 0   SYNTHROID 25 MCG tablet Take 1 tablet (25 mcg total) by mouth daily before breakfast. 30 tablet 0   traZODone (DESYREL) 50 MG tablet Take 1 tablet (50 mg total) by mouth at bedtime as needed for sleep. 30 tablet 3   cefdinir (OMNICEF) 250 MG/5ML suspension Take 6 mLs (300 mg total) by mouth 2 (two) times daily. 60 mL 0   No facility-administered medications prior to visit.    Allergies  Allergen Reactions   Erythromycin Base Diarrhea    Penicillins Rash    Review of Systems  Constitutional:  Negative for chills, fatigue and fever.  HENT:  Positive for congestion, postnasal drip, rhinorrhea, sinus pressure, sinus pain and sore throat. Negative for  ear pain.   Eyes: Negative.   Respiratory:  Positive for cough and choking. Negative for shortness of breath.   Cardiovascular:  Negative for chest pain.  Gastrointestinal:  Negative for diarrhea and nausea.  Endocrine: Negative.   Musculoskeletal: Negative.   Skin: Negative.   Allergic/Immunologic: Positive for environmental allergies.  Neurological:  Negative for dizziness and headaches.  Hematological: Negative.   Psychiatric/Behavioral:  The patient is nervous/anxious.       Objective:    Physical Exam Vitals reviewed.  Constitutional:      Appearance: Normal appearance. She is normal weight.  HENT:     Right Ear: Tympanic membrane, ear canal and external ear normal.     Left Ear: Tympanic membrane, ear canal and external ear normal.     Nose: Congestion and rhinorrhea present.     Mouth/Throat:     Pharynx: Oropharynx is clear. Posterior oropharyngeal erythema present.  Cardiovascular:     Rate and Rhythm: Normal rate and regular rhythm.     Pulses: Normal pulses.     Heart sounds: Normal heart sounds. No murmur heard. Pulmonary:     Effort: Pulmonary effort is normal. No respiratory distress.     Comments: Diminished lung sounds in all lobes Abdominal:     General: Abdomen is flat. Bowel sounds are normal.     Palpations: Abdomen is soft.     Tenderness: There is no abdominal tenderness.  Skin:    General: Skin is warm and dry.     Capillary Refill: Capillary refill takes less than 2 seconds.  Neurological:     General: No focal deficit present.     Mental Status: She is alert and oriented to person, place, and time.  Psychiatric:        Mood and Affect: Mood normal.        Behavior: Behavior normal.    Pulse 92   Temp (!) 96.7 F (35.9 C)    Ht 5' 0.5" (1.537 m)   Wt 114 lb (51.7 kg)   SpO2 99%   BMI 21.90 kg/m  Wt Readings from Last 3 Encounters:  09/16/20 114 lb (51.7 kg)  08/30/20 117 lb (53.1 kg)  08/25/20 117 lb (53.1 kg)    Health Maintenance Due  Topic Date Due   Zoster Vaccines- Shingrix (2 of 2) 06/22/2016   COVID-19 Vaccine (4 - Booster for Pfizer series) 02/15/2020   TETANUS/TDAP  05/17/2020   INFLUENZA VACCINE  08/01/2020       Lab Results  Component Value Date   TSH 5.650 (H) 08/30/2020   Lab Results  Component Value Date   WBC 14.6 (H) 08/30/2020   HGB 11.3 08/30/2020   HCT 33.6 (L) 08/30/2020   MCV 90 08/30/2020   PLT 474 (H) 08/30/2020   Lab Results  Component Value Date   NA 130 (L) 08/30/2020   K 3.5 08/30/2020   CO2 23 08/30/2020   GLUCOSE 97 08/30/2020   BUN 11 08/30/2020   CREATININE 0.62 08/30/2020   BILITOT 0.3 08/30/2020   ALKPHOS 57 08/30/2020   AST 17 08/30/2020   ALT 33 (H) 08/30/2020   PROT 6.0 08/30/2020   ALBUMIN 4.1 08/30/2020   CALCIUM 9.5 08/30/2020   EGFR 97 08/30/2020   GFR 75.48 07/09/2016   Lab Results  Component Value Date   CHOL 129 07/05/2020   Lab Results  Component Value Date   HDL 48 07/05/2020   Lab Results  Component Value Date   LDLCALC 58  07/05/2020   Lab Results  Component Value Date   TRIG 132 07/05/2020   Lab Results  Component Value Date   CHOLHDL 2.7 07/05/2020   Lab Results  Component Value Date   HGBA1C 6.0 (H) 07/05/2020       Assessment & Plan:    1. COPD with exacerbation ([place]) - clarithromycin (BIAXIN) 250 MG/5ML suspension; Take 10 mLs (500 mg total) by mouth 2 (two) times daily.  Dispense: 100 mL; Refill: 0 Pt requested liquid medication due to difficulty swallowing tablets  2. Sore throat - POC COVID-19 - POCT rapid strep A  3. Cough - POC COVID-19 - POCT rapid strep A - promethazine-dextromethorphan (PROMETHAZINE-DM) 6.25-15 MG/5ML syrup; Take 5 mLs by mouth 4 (four) times daily as needed.  Dispense:  118 mL; Refill: 0  4. Other acute recurrent sinusitis - fluticasone (FLONASE) 50 MCG/ACT nasal spray; Place 2 sprays into both nostrils daily.  Dispense: 16 g; Refill: 6  5. Acute blood loss anemia (ABLA) - CBC with Differential/Platelet  6. Acquired hypothyroidism - TSH   Flonase nasal spray daily Clarithromycin suspension 2 teaspoons twice daily for 5 days Use cough syrup up to 4 times daily Seek emergency medical care for severe shortness of breath Follow-up as needed      I spent 20 minutes dedicated to the care of this patient on the date of this encounter to include face-to-face time with the patient, as well as: EMR and prescription medication management.   Follow-up: No follow-ups on file.  An After Visit Summary was printed and given to the patient.  Rip Harbour, NP [place] [phone]

## 2020-09-16 NOTE — Patient Instructions (Addendum)
Flonase nasal spray daily Clarithromycin suspension 2 teaspoons twice daily for 5 days Use cough syrup up to 4 times daily Seek emergency medical care for severe shortness of breath Follow-up as needed   Sinusitis, Adult Sinusitis is soreness and swelling (inflammation) of your sinuses. Sinuses are hollow spaces in the bones around your face. They are located: Around your eyes. In the middle of your forehead. Behind your nose. In your cheekbones. Your sinuses and nasal passages are lined with a fluid called mucus. Mucus drains out of your sinuses. Swelling can trap mucus in your sinuses. This lets germs (bacteria, virus, or fungus) grow, which leads to infection. Most of the time, this condition is caused by a virus. What are the causes? This condition is caused by: Allergies. Asthma. Germs. Things that block your nose or sinuses. Growths in the nose (nasal polyps). Chemicals or irritants in the air. Fungus (rare). What increases the risk? You are more likely to develop this condition if: You have a weak body defense system (immune system). You do a lot of swimming or diving. You use nasal sprays too much. You smoke. What are the signs or symptoms? The main symptoms of this condition are pain and a feeling of pressure around the sinuses. Other symptoms include: Stuffy nose (congestion). Runny nose (drainage). Swelling and warmth in the sinuses. Headache. Toothache. A cough that may get worse at night. Mucus that collects in the throat or the back of the nose (postnasal drip). Being unable to smell and taste. Being very tired (fatigue). A fever. Sore throat. Bad breath. How is this diagnosed? This condition is diagnosed based on: Your symptoms. Your medical history. A physical exam. Tests to find out if your condition is short-term (acute) or long-term (chronic). Your doctor may: Check your nose for growths (polyps). Check your sinuses using a tool that has a light  (endoscope). Check for allergies or germs. Do imaging tests, such as an MRI or CT scan. How is this treated? Treatment for this condition depends on the cause and whether it is short-term or long-term. If caused by a virus, your symptoms should go away on their own within 10 days. You may be given medicines to relieve symptoms. They include: Medicines that shrink swollen tissue in the nose. Medicines that treat allergies (antihistamines). A spray that treats swelling of the nostrils.  Rinses that help get rid of thick mucus in your nose (nasal saline washes). If caused by bacteria, your doctor may wait to see if you will get better without treatment. You may be given antibiotic medicine if you have: A very bad infection. A weak body defense system. If caused by growths in the nose, you may need to have surgery. Follow these instructions at home: Medicines Take, use, or apply over-the-counter and prescription medicines only as told by your doctor. These may include nasal sprays. If you were prescribed an antibiotic medicine, take it as told by your doctor. Do not stop taking the antibiotic even if you start to feel better. Hydrate and humidify  Drink enough water to keep your pee (urine) pale yellow. Use a cool mist humidifier to keep the humidity level in your home above 50%. Breathe in steam for 10-15 minutes, 3-4 times a day, or as told by your doctor. You can do this in the bathroom while a hot shower is running. Try not to spend time in cool or dry air. Rest Rest as much as you can. Sleep with your head raised (elevated). Make  sure you get enough sleep each night. General instructions  Put a warm, moist washcloth on your face 3-4 times a day, or as often as told by your doctor. This will help with discomfort. Wash your hands often with soap and water. If there is no soap and water, use hand sanitizer. Do not smoke. Avoid being around people who are smoking (secondhand  smoke). Keep all follow-up visits as told by your doctor. This is important. Contact a doctor if: You have a fever. Your symptoms get worse. Your symptoms do not get better within 10 days. Get help right away if: You have a very bad headache. You cannot stop throwing up (vomiting). You have very bad pain or swelling around your face or eyes. You have trouble seeing. You feel confused. Your neck is stiff. You have trouble breathing. Summary Sinusitis is swelling of your sinuses. Sinuses are hollow spaces in the bones around your face. This condition is caused by tissues in your nose that become inflamed or swollen. This traps germs. These can lead to infection. If you were prescribed an antibiotic medicine, take it as told by your doctor. Do not stop taking it even if you start to feel better. Keep all follow-up visits as told by your doctor. This is important. This information is not intended to replace advice given to you by your health care provider. Make sure you discuss any questions you have with your health care provider. Document Revised: 05/20/2017 Document Reviewed: 05/20/2017 Elsevier Patient Education  2022 [place].  Chronic Obstructive Pulmonary Disease Exacerbation Chronic obstructive pulmonary disease (COPD) is a long-term (chronic) lung problem. In COPD, the flow of air from the lungs is limited. COPD exacerbations are times that breathing gets worse and you need more than your normal treatment. Without treatment, they can be life-threatening. If they happen often, your lungs can become more damaged. What are the causes? Having infections that affect your airways and lungs. Being exposed to: Smoke. Air pollution. Chemical fumes. Dust. Things that can cause an allergic reaction (allergens). Not taking your usual COPD medicines as told. Having medical problems already, such as heart failure or infections not involving the lungs. In many cases, the cause is not  known. What increases the risk? Smoking. Being an older adult. Having frequent prior COPD exacerbations. What are the signs or symptoms? Increased coughing. Increased mucus from your lungs. Increased wheezing. Increased shortness of breath. Fast breathing and finding it hard to breathe. Chest tightness. Less energy than usual. Sleep disruption from symptoms. Confusion. Increased sleepiness. Often, these symptoms happen or get worse even with the use of medicines. How is this treated? Treatment for this condition depends on how bad it is and the cause of the symptoms. You may need to stay in the hospital for treatment. Treatment may include: Taking medicines. Using oxygen. Being treated with different ways to clear your airway, such as using a mask to deliver oxygen. Follow these instructions at home: Medicines Take over-the-counter and prescription medicines only as told by your doctor. Use all inhaled medicines the correct way. If you were prescribed an antibiotic or steroid medicine, take it as told by your doctor. Do not stop taking it even if you start to feel better. Lifestyle Do not smoke or use any products that contain nicotine or tobacco. If you need help quitting, ask your doctor. Eat healthy foods. Exercise regularly. Get enough sleep. Most adults need 7 or more hours per night. Avoid tobacco smoke and other things that  can bother your lungs. Several times a day, wash your hands with soap and water for at least 20 seconds. If you cannot use soap and water, use hand sanitizer. This may help keep you from getting an infection. During flu season, avoid areas that are crowded with people. General instructions Drink enough fluid to keep your pee (urine) pale yellow. Do not do this if your doctor has told you not to. Use a cool mist machine (vaporizer). If you use oxygen or a machine that turns medicine into a mist (nebulizer), continue to use it as told. Keep all  follow-up visits. How is this prevented? Keep up with shots (vaccinations) as told by your doctor. Be sure to get a yearly flu (influenza) shot. If you smoke, quit smoking. Smoking makes the problem worse. Follow all instructions for rehabilitation. These are steps you can take to make your body work better. Work with your doctor to develop and follow an action plan. This tells you what steps to take when you experience certain symptoms. Contact a doctor if: Your COPD symptoms get worse than normal. Get help right away if: You are short of breath and it gets worse, even when you are resting. You have trouble talking. You have chest pain. You cough up blood. You have a fever. You keep vomiting. You feel weak or you pass out (faint). You feel confused. You are not able to sleep because of your symptoms. You have trouble doing daily activities. These symptoms may be an emergency. Get help right away. Call your local emergency services (911 in the [place] Do not wait to see if the symptoms will go away. Do not drive yourself to the hospital. Summary COPD exacerbations are times that breathing gets worse and you need more treatment than normal. COPD exacerbations can be very serious and may cause your lungs to become more damaged. Do not smoke. If you need help quitting, ask your doctor. Stay up to date on your shots. Get a flu shot every year. This information is not intended to replace advice given to you by your health care provider. Make sure you discuss any questions you have with your health care provider. Document Revised: 11/11/2019 Document Reviewed: 10/27/2019 Elsevier Patient Education  2022 [employer].

## 2020-09-16 NOTE — Addendum Note (Signed)
Addended by: Reinaldo Meeker on: 09/16/2020 12:40 PM   Modules accepted: Orders

## 2020-09-17 LAB — CBC WITH DIFFERENTIAL/PLATELET
Basophils Absolute: 0.1 10*3/uL (ref 0.0–0.2)
Basos: 1 %
EOS (ABSOLUTE): 0.2 10*3/uL (ref 0.0–0.4)
Eos: 2 %
Hematocrit: 34.5 % (ref 34.0–46.6)
Hemoglobin: 12 g/dL (ref 11.1–15.9)
Immature Grans (Abs): 0.1 10*3/uL (ref 0.0–0.1)
Immature Granulocytes: 1 %
Lymphocytes Absolute: 2.4 10*3/uL (ref 0.7–3.1)
Lymphs: 28 %
MCH: 29.6 pg (ref 26.6–33.0)
MCHC: 34.8 g/dL (ref 31.5–35.7)
MCV: 85 fL (ref 79–97)
Monocytes Absolute: 1 10*3/uL — ABNORMAL HIGH (ref 0.1–0.9)
Monocytes: 11 %
Neutrophils Absolute: 5.1 10*3/uL (ref 1.4–7.0)
Neutrophils: 57 %
Platelets: 413 10*3/uL (ref 150–450)
RBC: 4.05 x10E6/uL (ref 3.77–5.28)
RDW: 13.7 % (ref 11.7–15.4)
WBC: 8.8 10*3/uL (ref 3.4–10.8)

## 2020-09-17 LAB — TSH: TSH: 3.72 u[IU]/mL (ref 0.450–4.500)

## 2020-09-26 ENCOUNTER — Other Ambulatory Visit: Payer: Medicare PPO

## 2020-10-05 ENCOUNTER — Other Ambulatory Visit: Payer: Self-pay | Admitting: Physician Assistant

## 2020-10-07 DIAGNOSIS — M545 Low back pain, unspecified: Secondary | ICD-10-CM | POA: Diagnosis not present

## 2020-10-07 DIAGNOSIS — M25552 Pain in left hip: Secondary | ICD-10-CM | POA: Diagnosis not present

## 2020-10-07 DIAGNOSIS — M7062 Trochanteric bursitis, left hip: Secondary | ICD-10-CM | POA: Diagnosis not present

## 2020-10-11 ENCOUNTER — Ambulatory Visit: Payer: Medicare PPO | Admitting: Family Medicine

## 2020-10-11 DIAGNOSIS — E119 Type 2 diabetes mellitus without complications: Secondary | ICD-10-CM | POA: Diagnosis not present

## 2020-10-11 LAB — HM DIABETES EYE EXAM

## 2020-10-12 ENCOUNTER — Telehealth: Payer: Self-pay | Admitting: *Deleted

## 2020-10-12 ENCOUNTER — Encounter: Payer: Self-pay | Admitting: Family Medicine

## 2020-10-12 NOTE — Chronic Care Management (AMB) (Signed)
  Chronic Care Management   Note  10/12/2020 Name: KANIJAH GROSECLOSE MRN: [id number] DOB: 06/14/1950  ZALIA HAUTALA is a 70 y.o. year old female who is a primary care patient of Cox, Kirsten, MD. I reached out to Rich Fuchs by phone today in response to a referral sent by Ms. Lisbeth Renshaw Barbe's PCP.  Ms. Glendenning was given information about Chronic Care Management services today including:  CCM service includes personalized support from designated clinical staff supervised by her physician, including individualized plan of care and coordination with other care providers 24/7 contact phone numbers for assistance for urgent and routine care needs. Service will only be billed when office clinical staff spend 20 minutes or more in a month to coordinate care. Only one practitioner may furnish and bill the service in a calendar month. The patient may stop CCM services at any time (effective at the end of the month) by phone call to the office staff. The patient is responsible for co-pay (up to 20% after annual deductible is met) if co-pay is required by the individual health plan.   Patient agreed to services and verbal consent obtained.   Follow up plan: Telephone appointment with care management team member scheduled for:10/27/20  [facility]  Direct Dial: [phone]

## 2020-10-14 ENCOUNTER — Ambulatory Visit: Payer: Medicare PPO | Admitting: Family Medicine

## 2020-10-16 ENCOUNTER — Encounter: Payer: Self-pay | Admitting: Family Medicine

## 2020-10-17 ENCOUNTER — Encounter: Payer: Self-pay | Admitting: Family Medicine

## 2020-10-17 ENCOUNTER — Ambulatory Visit: Payer: Medicare PPO | Admitting: Family Medicine

## 2020-10-17 VITALS — BP 148/70 | HR 88 | Temp 97.4°F | Ht 61.0 in | Wt 115.0 lb

## 2020-10-17 DIAGNOSIS — R7303 Prediabetes: Secondary | ICD-10-CM | POA: Diagnosis not present

## 2020-10-17 DIAGNOSIS — S51811A Laceration without foreign body of right forearm, initial encounter: Secondary | ICD-10-CM

## 2020-10-17 DIAGNOSIS — Z23 Encounter for immunization: Secondary | ICD-10-CM | POA: Diagnosis not present

## 2020-10-17 DIAGNOSIS — I10 Essential (primary) hypertension: Secondary | ICD-10-CM | POA: Diagnosis not present

## 2020-10-17 DIAGNOSIS — F330 Major depressive disorder, recurrent, mild: Secondary | ICD-10-CM | POA: Diagnosis not present

## 2020-10-17 DIAGNOSIS — E785 Hyperlipidemia, unspecified: Secondary | ICD-10-CM | POA: Diagnosis not present

## 2020-10-17 DIAGNOSIS — F17200 Nicotine dependence, unspecified, uncomplicated: Secondary | ICD-10-CM

## 2020-10-17 DIAGNOSIS — E876 Hypokalemia: Secondary | ICD-10-CM

## 2020-10-17 DIAGNOSIS — E039 Hypothyroidism, unspecified: Secondary | ICD-10-CM | POA: Diagnosis not present

## 2020-10-17 DIAGNOSIS — H90A32 Mixed conductive and sensorineural hearing loss, unilateral, left ear with restricted hearing on the contralateral side: Secondary | ICD-10-CM | POA: Diagnosis not present

## 2020-10-17 DIAGNOSIS — H8103 Meniere's disease, bilateral: Secondary | ICD-10-CM

## 2020-10-17 MED ORDER — NYSTATIN 100000 UNIT/ML MT SUSP: 5.0000 mL | Freq: Four times a day (QID) | OROMUCOSAL | 0 refills | Status: DC
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
## 2020-10-18 LAB — CBC WITH DIFFERENTIAL/PLATELET
Basophils Absolute: 0 10*3/uL (ref 0.0–0.2)
Basos: 0 %
EOS (ABSOLUTE): 0 10*3/uL (ref 0.0–0.4)
Eos: 0 %
Hematocrit: 39.3 % (ref 34.0–46.6)
Hemoglobin: 13.1 g/dL (ref 11.1–15.9)
Immature Grans (Abs): 0.2 10*3/uL — ABNORMAL HIGH (ref 0.0–0.1)
Immature Granulocytes: 1 %
Lymphocytes Absolute: 3.1 10*3/uL (ref 0.7–3.1)
Lymphs: 21 %
MCH: 30.1 pg (ref 26.6–33.0)
MCHC: 33.3 g/dL (ref 31.5–35.7)
MCV: 90 fL (ref 79–97)
Monocytes Absolute: 1.2 10*3/uL — ABNORMAL HIGH (ref 0.1–0.9)
Monocytes: 8 %
Neutrophils Absolute: 10.6 10*3/uL — ABNORMAL HIGH (ref 1.4–7.0)
Neutrophils: 70 %
Platelets: 419 10*3/uL (ref 150–450)
RBC: 4.35 x10E6/uL (ref 3.77–5.28)
RDW: 14.2 % (ref 11.7–15.4)
WBC: 15.1 10*3/uL — ABNORMAL HIGH (ref 3.4–10.8)

## 2020-10-18 LAB — COMPREHENSIVE METABOLIC PANEL
ALT: 21 IU/L (ref 0–32)
AST: 21 IU/L (ref 0–40)
Albumin/Globulin Ratio: 2.1 (ref 1.2–2.2)
Albumin: 4.6 g/dL (ref 3.8–4.8)
Alkaline Phosphatase: 50 IU/L (ref 44–121)
BUN/Creatinine Ratio: 24 (ref 12–28)
BUN: 13 mg/dL (ref 8–27)
Bilirubin Total: 0.5 mg/dL (ref 0.0–1.2)
CO2: 29 mmol/L (ref 20–29)
Calcium: 10.2 mg/dL (ref 8.7–10.3)
Chloride: 87 mmol/L — ABNORMAL LOW (ref 96–106)
Creatinine, Ser: 0.55 mg/dL — ABNORMAL LOW (ref 0.57–1.00)
Globulin, Total: 2.2 g/dL (ref 1.5–4.5)
Glucose: 101 mg/dL — ABNORMAL HIGH (ref 70–99)
Potassium: 3.3 mmol/L — ABNORMAL LOW (ref 3.5–5.2)
Sodium: 129 mmol/L — ABNORMAL LOW (ref 134–144)
Total Protein: 6.8 g/dL (ref 6.0–8.5)
eGFR: 99 mL/min/{1.73_m2} (ref >59)

## 2020-10-18 LAB — LIPID PANEL
Chol/HDL Ratio: 2.3 ratio (ref 0.0–4.4)
Cholesterol, Total: 144 mg/dL (ref 100–199)
HDL: 62 mg/dL (ref >39)
LDL Chol Calc (NIH): 63 mg/dL (ref 0–99)
Triglycerides: 103 mg/dL (ref 0–149)
VLDL Cholesterol Cal: 19 mg/dL (ref 5–40)

## 2020-10-18 LAB — TSH: TSH: 2.89 u[IU]/mL (ref 0.450–4.500)

## 2020-10-19 DIAGNOSIS — D649 Anemia, unspecified: Secondary | ICD-10-CM | POA: Diagnosis not present

## 2020-10-19 DIAGNOSIS — R1310 Dysphagia, unspecified: Secondary | ICD-10-CM | POA: Diagnosis not present

## 2020-10-19 DIAGNOSIS — K5900 Constipation, unspecified: Secondary | ICD-10-CM | POA: Diagnosis not present

## 2020-10-19 DIAGNOSIS — K259 Gastric ulcer, unspecified as acute or chronic, without hemorrhage or perforation: Secondary | ICD-10-CM | POA: Diagnosis not present

## 2020-10-20 ENCOUNTER — Other Ambulatory Visit: Payer: Self-pay | Admitting: Family Medicine

## 2020-10-20 NOTE — Telephone Encounter (Signed)
"  Thyroid function normal. Does not need to restart synthroid."

## 2020-10-21 ENCOUNTER — Other Ambulatory Visit: Payer: Self-pay

## 2020-10-21 DIAGNOSIS — I10 Essential (primary) hypertension: Secondary | ICD-10-CM

## 2020-10-25 ENCOUNTER — Encounter: Payer: Self-pay | Admitting: Nurse Practitioner

## 2020-10-25 ENCOUNTER — Ambulatory Visit: Payer: Medicare PPO | Admitting: Nurse Practitioner

## 2020-10-25 VITALS — BP 130/70 | HR 101 | Temp 96.6°F | Ht 61.5 in | Wt 115.0 lb

## 2020-10-25 DIAGNOSIS — Z5320 Procedure and treatment not carried out because of patient's decision for unspecified reasons: Secondary | ICD-10-CM | POA: Diagnosis not present

## 2020-10-25 DIAGNOSIS — J441 Chronic obstructive pulmonary disease with (acute) exacerbation: Secondary | ICD-10-CM

## 2020-10-25 DIAGNOSIS — J018 Other acute sinusitis: Secondary | ICD-10-CM | POA: Diagnosis not present

## 2020-10-25 DIAGNOSIS — J410 Simple chronic bronchitis: Secondary | ICD-10-CM | POA: Diagnosis not present

## 2020-10-25 DIAGNOSIS — J432 Centrilobular emphysema: Secondary | ICD-10-CM

## 2020-10-25 DIAGNOSIS — F17218 Nicotine dependence, cigarettes, with other nicotine-induced disorders: Secondary | ICD-10-CM

## 2020-10-25 LAB — POCT RAPID STREP A (OFFICE): Rapid Strep A Screen: NEGATIVE

## 2020-10-25 LAB — POCT INFLUENZA A/B
Influenza A, POC: NEGATIVE
Influenza B, POC: NEGATIVE

## 2020-10-25 LAB — POC COVID19 BINAXNOW: SARS Coronavirus 2 Ag: NEGATIVE

## 2020-10-25 MED ORDER — CEFDINIR 250 MG/5ML PO SUSR: 300.0000 mg | Freq: Two times a day (BID) | ORAL | 0 refills | Status: DC

## 2020-10-25 MED ORDER — CEFDINIR 250 MG/5ML PO SUSR: 300.0000 mg | Freq: Two times a day (BID) | ORAL | 0 refills | Status: AC

## 2020-10-25 NOTE — Patient Instructions (Addendum)
Take Cefdinir liquid twice daily for 7 days Use inhalers as directed Consider quitting smoking Continue Delsym cough syrup as directed Follow-up as needed  Sinusitis, Adult Sinusitis is soreness and swelling (inflammation) of your sinuses. Sinuses are hollow spaces in the bones around your face. They are located: Around your eyes. In the middle of your forehead. Behind your nose. In your cheekbones. Your sinuses and nasal passages are lined with a fluid called mucus. Mucus drains out of your sinuses. Swelling can trap mucus in your sinuses. This lets germs (bacteria, virus, or fungus) grow, which leads to infection. Most of the time, this condition is caused by a virus. What are the causes? This condition is caused by: Allergies. Asthma. Germs. Things that block your nose or sinuses. Growths in the nose (nasal polyps). Chemicals or irritants in the air. Fungus (rare). What increases the risk? You are more likely to develop this condition if: You have a weak body defense system (immune system). You do a lot of swimming or diving. You use nasal sprays too much. You smoke. What are the signs or symptoms? The main symptoms of this condition are pain and a feeling of pressure around the sinuses. Other symptoms include: Stuffy nose (congestion). Runny nose (drainage). Swelling and warmth in the sinuses. Headache. Toothache. A cough that may get worse at night. Mucus that collects in the throat or the back of the nose (postnasal drip). Being unable to smell and taste. Being very tired (fatigue). A fever. Sore throat. Bad breath. How is this diagnosed? This condition is diagnosed based on: Your symptoms. Your medical history. A physical exam. Tests to find out if your condition is short-term (acute) or long-term (chronic). Your doctor may: Check your nose for growths (polyps). Check your sinuses using a tool that has a light (endoscope). Check for allergies or germs. Do  imaging tests, such as an MRI or CT scan. How is this treated? Treatment for this condition depends on the cause and whether it is short-term or long-term. If caused by a virus, your symptoms should go away on their own within 10 days. You may be given medicines to relieve symptoms. They include: Medicines that shrink swollen tissue in the nose. Medicines that treat allergies (antihistamines). A spray that treats swelling of the nostrils.  Rinses that help get rid of thick mucus in your nose (nasal saline washes). If caused by bacteria, your doctor may wait to see if you will get better without treatment. You may be given antibiotic medicine if you have: A very bad infection. A weak body defense system. If caused by growths in the nose, you may need to have surgery. Follow these instructions at home: Medicines Take, use, or apply over-the-counter and prescription medicines only as told by your doctor. These may include nasal sprays. If you were prescribed an antibiotic medicine, take it as told by your doctor. Do not stop taking the antibiotic even if you start to feel better. Hydrate and humidify  Drink enough water to keep your pee (urine) pale yellow. Use a cool mist humidifier to keep the humidity level in your home above 50%. Breathe in steam for 10-15 minutes, 3-4 times a day, or as told by your doctor. You can do this in the bathroom while a hot shower is running. Try not to spend time in cool or dry air. Rest Rest as much as you can. Sleep with your head raised (elevated). Make sure you get enough sleep each night. General instructions  Put a warm, moist washcloth on your face 3-4 times a day, or as often as told by your doctor. This will help with discomfort. Wash your hands often with soap and water. If there is no soap and water, use hand sanitizer. Do not smoke. Avoid being around people who are smoking (secondhand smoke). Keep all follow-up visits as told by your doctor.  This is important. Contact a doctor if: You have a fever. Your symptoms get worse. Your symptoms do not get better within 10 days. Get help right away if: You have a very bad headache. You cannot stop throwing up (vomiting). You have very bad pain or swelling around your face or eyes. You have trouble seeing. You feel confused. Your neck is stiff. You have trouble breathing. Summary Sinusitis is swelling of your sinuses. Sinuses are hollow spaces in the bones around your face. This condition is caused by tissues in your nose that become inflamed or swollen. This traps germs. These can lead to infection. If you were prescribed an antibiotic medicine, take it as told by your doctor. Do not stop taking it even if you start to feel better. Keep all follow-up visits as told by your doctor. This is important. This information is not intended to replace advice given to you by your health care provider. Make sure you discuss any questions you have with your health care provider. Document Revised: 05/20/2017 Document Reviewed: 05/20/2017 Elsevier Patient Education  2022 [employer].

## 2020-10-25 NOTE — Progress Notes (Signed)
Acute Office Visit  Subjective:    Patient ID: Allison Jimenez, female    DOB: [birth year], 70 y.o.   MRN: [id number]  Chief Complaint  Patient presents with   URI    HPI: Patient is in today for cough, wheezing, sinus congestion, rhinorrhea, post-nasal-drip, and dyspnea. Onset of symptoms was one week ago while traveling to the beach with friends. States one of her friends developed URI symptoms while at [employer]. Treatment has included Delsym cough syrup. Allison Jimenez has COPD and is a current smoker.She was  prescribed a course of Biaxin 09/16/20 for COPD exacerbation. She uses Trelegy inhaler intermittently.   Past Medical History:  Diagnosis Date   Allergy    ANXIETY 06/05/2006   Arthritis    SHOULDERS    BACK PAIN 01/05/2010   Cataract    bilateral, left worse   Centrilobular emphysema (HCC)    Centrilobular emphysema (HCC)    COPD (chronic obstructive pulmonary disease) (HCC)    DIVERTICULOSIS, COLON 06/05/2006   DIZZINESS OR VERTIGO 06/05/2006   positional   Hearing loss in left ear    HYPERLIPIDEMIA 06/05/2006   HYPOKALEMIA 12/14/2008   MENIERE'S DISEASE 06/25/2006   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   Qualifier: Diagnosis of  By: Amador Cunas  MD, Janett Labella    Migraine    TOBACCO USER 11/25/2007    Past Surgical History:  Procedure Laterality Date   APPENDECTOMY     CESAREAN SECTION     x1   CHOLECYSTECTOMY     COLONOSCOPY     COSMETIC SURGERY     mastoid surgery  1992   shunt in mastoid- endolymphatic sac in left ear    TUBAL LIGATION     vocal cord surgery     nodule x2    Family History  Problem Relation Age of Onset   Breast cancer Mother    Migraines Other    Depression Other    Anxiety disorder Other    High Cholesterol Other    Colon cancer Neg Hx    Rectal cancer Neg Hx    Stomach cancer Neg Hx    Colon polyps Neg Hx    Esophageal cancer Neg Hx     Social History   Socioeconomic History   Marital status: Divorced    Spouse name: Not on file   Number of children: 1   Years of education: 13   Highest education level: Not on file  Occupational History    Employer: [employer]    Comment: Retired  Tobacco Use   Smoking status: Every Day    Packs/day: 0.50    Years: 43.00    Pack years: 21.50    Types: Cigarettes   Smokeless tobacco: Never   Tobacco comments:    using electronic cigs.-12 cigs a day as well  Vaping Use   Vaping Use: Some days   Substances: Nicotine  Substance and Sexual Activity   Alcohol use: No    Alcohol/week: 0.0  standard drinks   Drug use: No   Sexual activity: Not on file  Other Topics Concern   Not on file  Social History Narrative   Patient lives at home alone. Patient has a Biochemist, clinical. Patient is divorced .   Patient is retired.   Education high school.   Right handed.   Caffeine none.   Social Determinants of Health   Financial Resource Strain: Not on file  Food Insecurity: Not on file  Transportation Needs: Not on  file  Physical Activity: Not on file  Stress: Not on file  Social Connections: Not on file  Intimate Partner Violence: Not on file    Outpatient Medications Prior to Visit  Medication Sig Dispense Refill   albuterol (VENTOLIN HFA) 108 (90 Base) MCG/ACT inhaler Inhale into the lungs.     ALPRAZolam (XANAX) 0.25 MG tablet TAKE 1 TABLET(0.25 MG) BY MOUTH THREE TIMES DAILY 90 tablet 1   atorvastatin (LIPITOR) 20 MG tablet TAKE 1 TABLET(20 MG) BY MOUTH DAILY 90 tablet 1   chlorthalidone (HYGROTON) 25 MG tablet TAKE 1 TABLET(25 MG) BY MOUTH EVERY DAY 90 tablet 4   cyclobenzaprine (FLEXERIL) 10 MG tablet Take by mouth.     diclofenac Sodium (VOLTAREN) 1 % GEL diclofenac 1 % topical gel 100 g 0   dicyclomine (BENTYL) 20 MG tablet dicyclomine 20 mg tablet  TK 1 T PO BID PRN     Erenumab-aooe (AIMOVIG) 140 MG/ML SOAJ Aimovig Autoinjector 140 mg/mL subcutaneous auto-injector  Inject by subcutaneous route.     FLUoxetine (PROZAC) 20 MG capsule Take 2 capsules (40 mg total) by mouth daily. 180 capsule 1   fluticasone (FLONASE) 50 MCG/ACT nasal spray Place 2 sprays into both nostrils daily. 16 g 6   montelukast (SINGULAIR) 10 MG tablet TAKE 1 TABLET(10 MG) BY MOUTH AT BEDTIME 90 tablet 2   nystatin (MYCOSTATIN) 100000 UNIT/ML suspension Take 5 mLs (500,000 Units total) by mouth 4 (four) times daily. Swish, gargle and spit x 1 week 473 mL 0   nystatin-triamcinolone (MYCOLOG II) cream Apply twice a day prn 60 g 0   ondansetron (ZOFRAN) 4 MG tablet TAKE 1 TABLET(4 MG) BY  MOUTH EVERY 8 HOURS AS NEEDED FOR NAUSEA OR VOMITING 90 tablet 0   pantoprazole (PROTONIX) 40 MG tablet Take 40 mg by mouth daily.     potassium chloride (MICRO-K) 10 MEQ CR capsule TAKE 1 CAPSULE(10 MEQ) BY MOUTH EVERY DAY WITH FOOD 90 capsule 0   promethazine (PHENERGAN) 25 MG tablet TAKE 1 TABLET BY MOUTH EVERY 8 HOURS IF NEEDED 20 tablet 0   rizatriptan (MAXALT) 10 MG tablet Take 1 tablet (10 mg total) by mouth as needed for migraine. May repeat in 2 hours if needed 10 tablet 0   SYNTHROID 25 MCG tablet Take 1 tablet (25 mcg total) by mouth daily before breakfast. (Patient not taking: Reported on 10/17/2020) 30 tablet 0   traZODone (DESYREL) 50 MG tablet Take 1 tablet (50 mg total) by mouth at bedtime as needed for sleep. 30 tablet 3   No facility-administered medications prior to visit.    Allergies  Allergen Reactions   Erythromycin Base Diarrhea   Penicillins Rash    Review of Systems  Constitutional:  Negative for chills, fatigue and fever.  HENT:  Positive for congestion, postnasal drip, rhinorrhea, sinus pressure, sinus pain, sore throat and voice change (hoarseness). Negative for ear pain.   Eyes: Negative.   Respiratory:  Positive for cough, shortness of breath and wheezing.   Cardiovascular:  Negative for chest pain.  Gastrointestinal:  Negative for diarrhea and nausea.  Genitourinary: Negative.   Allergic/Immunologic: Positive for environmental allergies.  Neurological:  Positive for headaches. Negative for dizziness.  Hematological: Negative.   Psychiatric/Behavioral: Negative.        Objective:    Physical Exam Vitals reviewed.  Constitutional:      Appearance: Normal appearance.  HENT:     Right Ear: Tympanic membrane normal.     Left Ear: Tympanic membrane  normal.     Nose: Congestion and rhinorrhea present.     Mouth/Throat:     Mouth: Mucous membranes are moist.     Pharynx: Posterior oropharyngeal erythema present.  Cardiovascular:     Rate and  Rhythm: Regular rhythm. Tachycardia present.     Pulses: Normal pulses.     Heart sounds: Normal heart sounds.  Pulmonary:     Effort: Pulmonary effort is normal.     Breath sounds: Wheezing (inspiratory) and rhonchi present.  Abdominal:     General: Bowel sounds are normal.     Palpations: Abdomen is soft.  Skin:    General: Skin is warm and dry.     Capillary Refill: Capillary refill takes less than 2 seconds.  Neurological:     General: No focal deficit present.     Mental Status: She is alert and oriented to person, place, and time.  Psychiatric:        Mood and Affect: Mood normal.        Behavior: Behavior normal.    BP 130/70   Pulse (!) 101   Temp (!) 96.6 F (35.9 C)   Ht 5' 1.5" (1.562 m)   Wt 115 lb (52.2 kg)   SpO2 96%   BMI 21.38 kg/m  Wt Readings from Last 3 Encounters:  10/25/20 115 lb (52.2 kg)  10/17/20 115 lb (52.2 kg)  09/16/20 114 lb (51.7 kg)    Health Maintenance Due  Topic Date Due   Zoster Vaccines- Shingrix (2 of 2) 06/22/2016   TETANUS/TDAP  05/17/2020     Lab Results  Component Value Date   TSH 2.890 10/17/2020   Lab Results  Component Value Date   WBC 15.1 (H) 10/17/2020   HGB 13.1 10/17/2020   HCT 39.3 10/17/2020   MCV 90 10/17/2020   PLT 419 10/17/2020   Lab Results  Component Value Date   NA 129 (L) 10/17/2020   K 3.3 (L) 10/17/2020   CO2 29 10/17/2020   GLUCOSE 101 (H) 10/17/2020   BUN 13 10/17/2020   CREATININE 0.55 (L) 10/17/2020   BILITOT 0.5 10/17/2020   ALKPHOS 50 10/17/2020   AST 21 10/17/2020   ALT 21 10/17/2020   PROT 6.8 10/17/2020   ALBUMIN 4.6 10/17/2020   CALCIUM 10.2 10/17/2020   EGFR 99 10/17/2020   GFR 75.48 07/09/2016   Lab Results  Component Value Date   CHOL 144 10/17/2020   Lab Results  Component Value Date   HDL 62 10/17/2020   Lab Results  Component Value Date   LDLCALC 63 10/17/2020   Lab Results  Component Value Date   TRIG 103 10/17/2020   Lab Results  Component Value Date    CHOLHDL 2.3 10/17/2020   Lab Results  Component Value Date   HGBA1C 6.0 (H) 07/05/2020       Assessment & Plan:   1. COPD exacerbation (HCC) - cefdinir (OMNICEF) 250 MG/5ML suspension; Take 6 mLs (300 mg total) by mouth 2 (two) times daily for 7 days.  Dispense: 100 mL; Refill: 0  2. Acute non-recurrent sinusitis of other sinus - cefdinir (OMNICEF) 250 MG/5ML suspension; Take 6 mLs (300 mg total) by mouth 2 (two) times daily for 7 days.  Dispense: 100 mL; Refill: 0  3. Centrilobular emphysema ([place]) -continue Trelegy inhaler daily, rinse mouth afterwards  4. Cigarette nicotine dependence with other nicotine-induced disorder -recommend cessation  5. Smokers' cough ([place]) - POCT Influenza A/B-NEGATIVE - POC COVID-19 BinaxNow-NEGATIVE - POCT  rapid strep A-NEGATIVE  6. Recommendation refused by patient  -Pt declined in-office duoneb treatment for wheezing and rhonchi, stated she was concerned she would become jittery   Take Cefdinir liquid twice daily for 7 days Use inhalers as directed Consider quitting smoking Continue Delsym cough syrup as directed Follow-up as needed      Follow-up: PRN  An After Visit Summary was printed and given to the patient.  I, Rip Harbour, NP, have reviewed all documentation for this visit. The documentation on 10/25/20 for the exam, diagnosis, procedures, and orders are all accurate and complete.    Signed, Rip Harbour, NP [place] [phone]

## 2020-10-27 ENCOUNTER — Telehealth: Payer: Self-pay

## 2020-10-27 ENCOUNTER — Other Ambulatory Visit: Payer: Self-pay | Admitting: Family Medicine

## 2020-10-27 ENCOUNTER — Telehealth: Payer: Medicare PPO

## 2020-10-27 NOTE — Telephone Encounter (Signed)
Placed call to patient for initial CCM encounter and patient is sick. Very little voice. Appointment cancelled and rescheduled.  Tomasa Rand RN, BSN, CEN RN Case Freight forwarder - [employer] officer Mobile: [phone]

## 2020-10-31 ENCOUNTER — Encounter: Payer: Self-pay | Admitting: Family Medicine

## 2020-10-31 ENCOUNTER — Telehealth: Payer: Self-pay

## 2020-10-31 ENCOUNTER — Encounter: Payer: Self-pay | Admitting: Nurse Practitioner

## 2020-10-31 MED ORDER — POTASSIUM CHLORIDE ER 10 MEQ PO CPCR: 20.0000 meq | ORAL_CAPSULE | Freq: Every day | ORAL | 0 refills | Status: DC

## 2020-10-31 NOTE — Assessment & Plan Note (Signed)
The current medical regimen is effective;  continue present plan and medications. Continue taking xanax 0.25 mg one tid and prozac 20 mg 2 daily. Trazodone prn for insomnia.

## 2020-10-31 NOTE — Assessment & Plan Note (Signed)
Increase potassium chloride (micro K) to 10 meq 2 daily.

## 2020-10-31 NOTE — Assessment & Plan Note (Signed)
Chronic issue. Has seen ENT in the past.

## 2020-10-31 NOTE — Telephone Encounter (Signed)
Pt calling she is feeling better but not 100%. She is continuing to have congestion and cough. Wants refill of cefdinir.   [name], [place] 10/31/20 3:07 PM

## 2020-10-31 NOTE — Assessment & Plan Note (Signed)
Controlled with chlorthalidone

## 2020-10-31 NOTE — Assessment & Plan Note (Signed)
Recommend continue to work on eating healthy diet and exercise. Continue lipitor 20 mg once daily.

## 2020-10-31 NOTE — Assessment & Plan Note (Signed)
Refuses to quit. Understands risks.

## 2020-10-31 NOTE — Assessment & Plan Note (Signed)
Continue chlorthalidone which is more for her meniere's. Start on hydralazine 25 mg one bid.

## 2020-10-31 NOTE — Assessment & Plan Note (Signed)
tsh normalized.  Remain off synthroid.

## 2020-11-01 ENCOUNTER — Encounter: Payer: Self-pay | Admitting: Nurse Practitioner

## 2020-11-01 ENCOUNTER — Ambulatory Visit: Payer: Medicare PPO | Admitting: Nurse Practitioner

## 2020-11-01 ENCOUNTER — Ambulatory Visit (INDEPENDENT_AMBULATORY_CARE_PROVIDER_SITE_OTHER): Payer: Medicare PPO

## 2020-11-01 VITALS — BP 130/64 | HR 108 | Temp 97.3°F | Ht 61.5 in | Wt 116.0 lb

## 2020-11-01 DIAGNOSIS — F17200 Nicotine dependence, unspecified, uncomplicated: Secondary | ICD-10-CM

## 2020-11-01 DIAGNOSIS — J441 Chronic obstructive pulmonary disease with (acute) exacerbation: Secondary | ICD-10-CM | POA: Diagnosis not present

## 2020-11-01 DIAGNOSIS — R911 Solitary pulmonary nodule: Secondary | ICD-10-CM | POA: Diagnosis not present

## 2020-11-01 DIAGNOSIS — R059 Cough, unspecified: Secondary | ICD-10-CM | POA: Diagnosis not present

## 2020-11-01 DIAGNOSIS — J439 Emphysema, unspecified: Secondary | ICD-10-CM | POA: Diagnosis not present

## 2020-11-01 DIAGNOSIS — J449 Chronic obstructive pulmonary disease, unspecified: Secondary | ICD-10-CM

## 2020-11-01 DIAGNOSIS — H8103 Meniere's disease, bilateral: Secondary | ICD-10-CM

## 2020-11-01 DIAGNOSIS — I10 Essential (primary) hypertension: Secondary | ICD-10-CM

## 2020-11-01 MED ORDER — TRELEGY ELLIPTA 100-62.5-25 MCG/ACT IN AEPB: 1.0000 | INHALATION_SPRAY | Freq: Every day | RESPIRATORY_TRACT | 0 refills | Status: DC

## 2020-11-01 MED ORDER — IPRATROPIUM-ALBUTEROL 0.5-2.5 (3) MG/3ML IN SOLN: 3.0000 mL | Freq: Once | RESPIRATORY_TRACT | Status: DC

## 2020-11-01 NOTE — Telephone Encounter (Signed)
Scheduled an appointment 

## 2020-11-01 NOTE — Progress Notes (Signed)
Acute Office Visit  Subjective:    Patient ID: Allison Jimenez, female    DOB: November 15, 1950, 70 y.o.   MRN: [id number]  Chief Complaint  Patient presents with   COPD    HPI: Allison Jimenez returns to the office with dyspnea, cough, and fatigue. She was seen 1-week ago for COPD exacerbation. She was encouraged to use Trelegy inhaler daily and to quit smoking. She declined in-office nebulizer treatment last week but has agreed to one today.   Patient is in today for COPD, Follow up  She was last seen for this 1 weeks ago. Changes made include Omnicef antibiotic prescribed.Encouraged pt to use Trelegy inhaler daily, rinse mouth afterwards   She reports poor compliance with treatment. She is not having side effects.  she uses rescue inhaler 0 per . She IS experiencing dyspnea, cough, wheezing, and fatigue. She is NOT experiencing chills or fever. she reports breathing is Unchanged.    Past Medical History:  Diagnosis Date   Acute blood loss anemia (ABLA) 09/05/2020   Allergy    ANXIETY 06/05/2006   Arthritis    SHOULDERS    BACK PAIN 01/05/2010   Cataract    bilateral, left worse   Centrilobular emphysema (HCC)    Centrilobular emphysema (HCC)    COPD (chronic obstructive pulmonary disease) ([place])    DIVERTICULOSIS, COLON 06/05/2006   DIZZINESS OR VERTIGO 06/05/2006   positional   Hearing loss in left ear    HYPERLIPIDEMIA 06/05/2006   HYPOKALEMIA 12/14/2008   MENIERE'S DISEASE 06/25/2006   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   Qualifier: Diagnosis of  By: Burnice Logan  MD, Doretha Sou    Migraine    TOBACCO USER 11/25/2007    Past Surgical History:  Procedure Laterality Date   APPENDECTOMY     CESAREAN SECTION     x1   CHOLECYSTECTOMY     COLONOSCOPY     COSMETIC SURGERY     mastoid surgery  1992   shunt in mastoid- endolymphatic sac in left ear    TUBAL LIGATION     vocal cord surgery     nodule x2    Family History  Problem Relation Age of Onset   Breast cancer Mother    Migraines  Other    Depression Other    Anxiety disorder Other    High Cholesterol Other    Colon cancer Neg Hx    Rectal cancer Neg Hx    Stomach cancer Neg Hx    Colon polyps Neg Hx    Esophageal cancer Neg Hx     Social History   Socioeconomic History   Marital status: Divorced    Spouse name: Not on file   Number of children: 1   Years of education: 13   Highest education level: Not on file  Occupational History    Employer: [employer]    Comment: Retired  Tobacco Use   Smoking status: Every Day    Packs/day: 0.50    Years: 43.00    Pack years: 21.50    Types: Cigarettes   Smokeless tobacco: Never   Tobacco comments:    using electronic cigs.-12 cigs a day as well  Vaping Use   Vaping Use: Some days   Substances: Nicotine  Substance and Sexual Activity   Alcohol use: No    Alcohol/week: 0.0 standard drinks   Drug use: No   Sexual activity: Not on file  Other Topics Concern   Not on file  Social History  Narrative   Patient lives at home alone. Patient has a Data processing manager. Patient is divorced .   Patient is retired.   Education high school.   Right handed.   Caffeine none.   Social Determinants of Health   Financial Resource Strain: Not on file  Food Insecurity: Not on file  Transportation Needs: Not on file  Physical Activity: Not on file  Stress: Not on file  Social Connections: Not on file  Intimate Partner Violence: Not on file    Outpatient Medications Prior to Visit  Medication Sig Dispense Refill   albuterol (VENTOLIN HFA) 108 (90 Base) MCG/ACT inhaler Inhale into the lungs.     ALPRAZolam (XANAX) 0.25 MG tablet TAKE 1 TABLET(0.25 MG) BY MOUTH THREE TIMES DAILY 90 tablet 1   atorvastatin (LIPITOR) 20 MG tablet TAKE 1 TABLET(20 MG) BY MOUTH DAILY 90 tablet 1   cefdinir (OMNICEF) 250 MG/5ML suspension Take 6 mLs (300 mg total) by mouth 2 (two) times daily for 7 days. 100 mL 0   chlorthalidone (HYGROTON) 25 MG tablet TAKE 1 TABLET(25  MG) BY MOUTH EVERY DAY 90 tablet 4   cyclobenzaprine (FLEXERIL) 10 MG tablet Take by mouth.     diclofenac Sodium (VOLTAREN) 1 % GEL diclofenac 1 % topical gel 100 g 0   dicyclomine (BENTYL) 20 MG tablet dicyclomine 20 mg tablet  TK 1 T PO BID PRN     Erenumab-aooe (AIMOVIG) 140 MG/ML SOAJ Aimovig Autoinjector 140 mg/mL subcutaneous auto-injector  Inject by subcutaneous route.     FLUoxetine (PROZAC) 20 MG capsule Take 2 capsules (40 mg total) by mouth daily. 180 capsule 1   fluticasone (FLONASE) 50 MCG/ACT nasal spray Place 2 sprays into both nostrils daily. 16 g 6   montelukast (SINGULAIR) 10 MG tablet TAKE 1 TABLET(10 MG) BY MOUTH AT BEDTIME 90 tablet 2   nystatin (MYCOSTATIN) 100000 UNIT/ML suspension Take 5 mLs (500,000 Units total) by mouth 4 (four) times daily. Swish, gargle and spit x 1 week 473 mL 0   nystatin-triamcinolone (MYCOLOG II) cream Apply twice a day prn 60 g 0   ondansetron (ZOFRAN) 4 MG tablet TAKE 1 TABLET(4 MG) BY MOUTH EVERY 8 HOURS AS NEEDED FOR NAUSEA OR VOMITING 90 tablet 0   pantoprazole (PROTONIX) 40 MG tablet Take 40 mg by mouth daily.     potassium chloride (MICRO-K) 10 MEQ CR capsule Take 2 capsules (20 mEq total) by mouth daily. 180 capsule 0   promethazine (PHENERGAN) 25 MG tablet TAKE 1 TABLET BY MOUTH EVERY 8 HOURS IF NEEDED 20 tablet 0   rizatriptan (MAXALT) 10 MG tablet Take 1 tablet (10 mg total) by mouth as needed for migraine. May repeat in 2 hours if needed 10 tablet 0   SYNTHROID 25 MCG tablet Take 1 tablet (25 mcg total) by mouth daily before breakfast. (Patient not taking: Reported on 10/17/2020) 30 tablet 0   traZODone (DESYREL) 50 MG tablet Take 1 tablet (50 mg total) by mouth at bedtime as needed for sleep. 30 tablet 3   No facility-administered medications prior to visit.    Allergies  Allergen Reactions   Erythromycin Base Diarrhea   Penicillins Rash    Review of Systems  Constitutional:  Negative for chills, fatigue and fever.  HENT:   Positive for congestion and rhinorrhea. Negative for ear pain, postnasal drip, sinus pressure, sinus pain and sore throat.   Respiratory:  Positive for cough, shortness of breath and wheezing.   Cardiovascular:  Negative for  chest pain.      Objective:    Physical Exam Vitals reviewed.  Constitutional:      Appearance: Normal appearance.  HENT:     Nose: Congestion and rhinorrhea present.     Mouth/Throat:     Mouth: Mucous membranes are dry.     Pharynx: No posterior oropharyngeal erythema.  Cardiovascular:     Rate and Rhythm: Regular rhythm. Tachycardia present.     Pulses: Normal pulses.     Heart sounds: Normal heart sounds.  Pulmonary:     Effort: No respiratory distress.     Breath sounds: Wheezing present. No rhonchi.  Abdominal:     General: Bowel sounds are normal.     Palpations: Abdomen is soft.  Skin:    General: Skin is warm and dry.     Capillary Refill: Capillary refill takes less than 2 seconds.  Neurological:     General: No focal deficit present.     Mental Status: She is alert and oriented to person, place, and time.    Pulse (!) 108   Temp (!) 97.3 F (36.3 C)   Ht 5' 1.5" (1.562 m)   Wt 116 lb (52.6 kg)   SpO2 97%   BMI 21.56 kg/m  Wt Readings from Last 3 Encounters:  11/01/20 116 lb (52.6 kg)  10/25/20 115 lb (52.2 kg)  10/17/20 115 lb (52.2 kg)    Health Maintenance Due  Topic Date Due   Zoster Vaccines- Shingrix (2 of 2) 06/22/2016   TETANUS/TDAP  05/17/2020       Lab Results  Component Value Date   TSH 2.890 10/17/2020   Lab Results  Component Value Date   WBC 15.1 (H) 10/17/2020   HGB 13.1 10/17/2020   HCT 39.3 10/17/2020   MCV 90 10/17/2020   PLT 419 10/17/2020   Lab Results  Component Value Date   NA 129 (L) 10/17/2020   K 3.3 (L) 10/17/2020   CO2 29 10/17/2020   GLUCOSE 101 (H) 10/17/2020   BUN 13 10/17/2020   CREATININE 0.55 (L) 10/17/2020   BILITOT 0.5 10/17/2020   ALKPHOS 50 10/17/2020   AST 21 10/17/2020    ALT 21 10/17/2020   PROT 6.8 10/17/2020   ALBUMIN 4.6 10/17/2020   CALCIUM 10.2 10/17/2020   EGFR 99 10/17/2020   GFR 75.48 07/09/2016   Lab Results  Component Value Date   CHOL 144 10/17/2020   Lab Results  Component Value Date   HDL 62 10/17/2020   Lab Results  Component Value Date   LDLCALC 63 10/17/2020   Lab Results  Component Value Date   TRIG 103 10/17/2020   Lab Results  Component Value Date   CHOLHDL 2.3 10/17/2020   Lab Results  Component Value Date   HGBA1C 6.0 (H) 07/05/2020       Assessment & Plan:   1. COPD exacerbation ([place]) - DG Chest 2 View - ipratropium-albuterol (DUONEB) 0.5-2.5 (3) MG/3ML nebulizer solution 3 mL - Ambulatory referral to Pulmonology - Fluticasone-Umeclidin-Vilant (TRELEGY ELLIPTA) 100-62.5-25 MCG/ACT AEPB; Inhale 1 puff into the lungs daily.  Dispense: 2 each; Refill: 0      Bilateral inspiratory wheezing subsided after duoneb treatment, good air exchange.    Continue Trelegy inhaler daily, rinse mouth afterwards Obtain chest x-ray at [facility] today We will call you with results Consider quitting smoking Seek emergency medical care for severe shortness of breath or any other serious health concerns Follow-up as needed  Follow-up: PRN  An After Visit Summary was printed and given to the patient.  I, Rip Harbour, NP, have reviewed all documentation for this visit. The documentation on 11/01/20 for the exam, diagnosis, procedures, and orders are all accurate and complete.   Rip Harbour, NP [place] [phone]

## 2020-11-01 NOTE — Patient Instructions (Signed)
Continue Trelegy inhaler daily, rinse mouth afterwards Obtain chest x-ray at [facility] today We will call you with results Consider quitting smoking Seek emergency medical care for severe shortness of breath or any other serious health concerns Follow-up as needed  Chronic Obstructive Pulmonary Disease Exacerbation Chronic obstructive pulmonary disease (COPD) is a long-term (chronic) lung problem. In COPD, the flow of air from the lungs is limited. COPD exacerbations are times that breathing gets worse and you need more than your normal treatment. Without treatment, they can be life-threatening. If they happen often, your lungs can become more damaged. What are the causes? Having infections that affect your airways and lungs. Being exposed to: Smoke. Air pollution. Chemical fumes. Dust. Things that can cause an allergic reaction (allergens). Not taking your usual COPD medicines as told. Having medical problems already, such as heart failure or infections not involving the lungs. In many cases, the cause is not known. What increases the risk? Smoking. Being an older adult. Having frequent prior COPD exacerbations. What are the signs or symptoms? Increased coughing. Increased mucus from your lungs. Increased wheezing. Increased shortness of breath. Fast breathing and finding it hard to breathe. Chest tightness. Less energy than usual. Sleep disruption from symptoms. Confusion. Increased sleepiness. Often, these symptoms happen or get worse even with the use of medicines. How is this treated? Treatment for this condition depends on how bad it is and the cause of the symptoms. You may need to stay in the hospital for treatment. Treatment may include: Taking medicines. Using oxygen. Being treated with different ways to clear your airway, such as using a mask to deliver oxygen. Follow these instructions at home: Medicines Take over-the-counter and prescription  medicines only as told by your doctor. Use all inhaled medicines the correct way. If you were prescribed an antibiotic or steroid medicine, take it as told by your doctor. Do not stop taking it even if you start to feel better. Lifestyle Do not smoke or use any products that contain nicotine or tobacco. If you need help quitting, ask your doctor. Eat healthy foods. Exercise regularly. Get enough sleep. Most adults need 7 or more hours per night. Avoid tobacco smoke and other things that can bother your lungs. Several times a day, wash your hands with soap and water for at least 20 seconds. If you cannot use soap and water, use hand sanitizer. This may help keep you from getting an infection. During flu season, avoid areas that are crowded with people. General instructions Drink enough fluid to keep your pee (urine) pale yellow. Do not do this if your doctor has told you not to. Use a cool mist machine (vaporizer). If you use oxygen or a machine that turns medicine into a mist (nebulizer), continue to use it as told. Keep all follow-up visits. How is this prevented? Keep up with shots (vaccinations) as told by your doctor. Be sure to get a yearly flu (influenza) shot. If you smoke, quit smoking. Smoking makes the problem worse. Follow all instructions for rehabilitation. These are steps you can take to make your body work better. Work with your doctor to develop and follow an action plan. This tells you what steps to take when you experience certain symptoms. Contact a doctor if: Your COPD symptoms get worse than normal. Get help right away if: You are short of breath and it gets worse, even when you are resting. You have trouble talking. You have chest pain. You cough up blood. You have  a fever. You keep vomiting. You feel weak or you pass out (faint). You feel confused. You are not able to sleep because of your symptoms. You have trouble doing daily activities. These symptoms may  be an emergency. Get help right away. Call your local emergency services (911 in the [place] Do not wait to see if the symptoms will go away. Do not drive yourself to the hospital. Summary COPD exacerbations are times that breathing gets worse and you need more treatment than normal. COPD exacerbations can be very serious and may cause your lungs to become more damaged. Do not smoke. If you need help quitting, ask your doctor. Stay up to date on your shots. Get a flu shot every year. This information is not intended to replace advice given to you by your health care provider. Make sure you discuss any questions you have with your health care provider. Document Revised: 11/11/2019 Document Reviewed: 10/27/2019 Elsevier Patient Education  2022 [place] smoking is a physical and mental challenge. You will face cravings, withdrawal symptoms, and temptation. Before quitting, work with your health care provider to make a plan that can help you manage quitting. Preparation can help you quit and keep you from giving in. How to manage lifestyle changes Managing stress Stress can make you want to smoke, and wanting to smoke may cause stress. It is important to find ways to manage your stress. You might try some of the following: Practice relaxation techniques. Breathe slowly and deeply, in through your nose and out through your mouth. Listen to music. Soak in a bath or take a shower. Imagine a peaceful place or vacation. Get some support. Talk with family or friends about your stress. Join a support group. Talk with a counselor or therapist. Get some physical activity. Go for a walk, run, or bike ride. Play a favorite sport. Practice yoga.  Medicines Talk with your health care provider about medicines that might help you deal with cravings and make quitting easier for you. Relationships Social situations can be difficult when you are quitting smoking. To manage this, you  can: Avoid parties and other social situations where people might be smoking. Avoid alcohol. Leave right away if you have the urge to smoke. Explain to your family and friends that you are quitting smoking. Ask for support and let them know you might be a bit grumpy. Plan activities where smoking is not an option. General instructions Be aware that many people gain weight after they quit smoking. However, not everyone does. To keep from gaining weight, have a plan in place before you quit and stick to the plan after you quit. Your plan should include: Having healthy snacks. When you have a craving, it may help to: Eat popcorn, carrots, celery, or other cut vegetables. Chew sugar-free gum. Changing how you eat. Eat small portion sizes at meals. Eat 4-6 small meals throughout the day instead of 1-2 large meals a day. Be mindful when you eat. Do not watch television or do other things that might distract you as you eat. Exercising regularly. Make time to exercise each day. If you do not have time for a long workout, do short bouts of exercise for 5-10 minutes several times a day. Do some form of strengthening exercise, such as weight lifting. Do some exercise that gets your heart beating and causes you to breathe deeply, such as walking fast, running, swimming, or biking. This is very important. Drinking plenty of water or other low-calorie  or no-calorie drinks. Drink 6-8 glasses of water daily.  How to recognize withdrawal symptoms Your body and mind may experience discomfort as you try to get used to not having nicotine in your system. These effects are called withdrawal symptoms. They may include: Feeling hungrier than normal. Having trouble concentrating. Feeling irritable or restless. Having trouble sleeping. Feeling depressed. Craving a cigarette. To manage withdrawal symptoms: Avoid places, people, and activities that trigger your cravings. Remember why you want to quit. Get  plenty of sleep. Avoid coffee and other caffeinated drinks. These may worsen some of your symptoms. These symptoms may surprise you. But be assured that they are normal to have when quitting smoking. How to manage cravings Come up with a plan for how to deal with your cravings. The plan should include the following: A definition of the specific situation you want to deal with. An alternative action you will take. A clear idea for how this action will help. The name of someone who might help you with this. Cravings usually last for 5-10 minutes. Consider taking the following actions to help you with your plan to deal with cravings: Keep your mouth busy. Chew sugar-free gum. Suck on hard candies or a straw. Brush your teeth. Keep your hands and body busy. Change to a different activity right away. Squeeze or play with a ball. Do an activity or a hobby, such as making bead jewelry, practicing needlepoint, or working with wood. Mix up your normal routine. Take a short exercise break. Go for a quick walk or run up and down stairs. Focus on doing something kind or helpful for someone else. Call a friend or family member to talk during a craving. Join a support group. Contact a quitline. Where to find support To get help or find a support group: Call the [facility]'s Smoking Quitline: 1-800-QUIT NOW (204)585-1054) Visit the website of the Substance Abuse and [place]: ktimeonline.com Text QUIT to SmokefreeTXT: 277412 Where to find more information Visit these websites to find more information on quitting smoking: [place]: www.smokefree.gov American Lung Association: www.lung.org American Cancer Society: www.cancer.org Centers for Disease Control and Prevention: http://www.wolf.info/ American Heart Association: www.heart.org Contact a health care provider if: You want to change your plan for quitting. The medicines you are taking are not  helping. Your eating feels out of control or you cannot sleep. Get help right away if: You feel depressed or become very anxious. Summary Quitting smoking is a physical and mental challenge. You will face cravings, withdrawal symptoms, and temptation to smoke again. Preparation can help you as you go through these challenges. Try different techniques to manage stress, handle social situations, and prevent weight gain. You can deal with cravings by keeping your mouth busy (such as by chewing gum), keeping your hands and body busy, calling family or friends, or contacting a quitline for people who want to quit smoking. You can deal with withdrawal symptoms by avoiding places where people smoke, getting plenty of rest, and avoiding drinks with caffeine. This information is not intended to replace advice given to you by your health care provider. Make sure you discuss any questions you have with your health care provider. Document Revised: 10/07/2018 Document Reviewed: 10/07/2018 Elsevier Patient Education  [place].

## 2020-11-02 NOTE — Chronic Care Management (AMB) (Signed)
Chronic Care Management   CCM RN Visit Note  11/02/2020 Name: Allison Jimenez MRN: [id number] DOB: 02/27/1950  Subjective: Allison Jimenez is a 70 y.o. year old female who is a primary care patient of Cox, Kirsten, MD. The care management team was consulted for assistance with disease management and care coordination needs.    Engaged with patient by telephone for initial visit in response to provider referral for case management and/or care coordination services.   Consent to Services:  The patient was given the following information about Chronic Care Management services today, agreed to services, and gave verbal consent: 1. CCM service includes personalized support from designated clinical staff supervised by the primary care provider, including individualized plan of care and coordination with other care providers 2. 24/7 contact phone numbers for assistance for urgent and routine care needs. 3. Service will only be billed when office clinical staff spend 20 minutes or more in a month to coordinate care. 4. Only one practitioner may furnish and bill the service in a calendar month. 5.The patient may stop CCM services at any time (effective at the end of the month) by phone call to the office staff. 6. The patient will be responsible for cost sharing (co-pay) of up to 20% of the service fee (after annual deductible is met). Patient agreed to services and consent obtained.  Patient agreed to services and verbal consent obtained.   Assessment: Review of patient past medical history, allergies, medications, health status, including review of consultants reports, laboratory and other test data, was performed as part of comprehensive evaluation and provision of chronic care management services.   SDOH (Social Determinants of Health) assessments and interventions performed:    CCM Care Plan  Allergies  Allergen Reactions   Erythromycin Base Diarrhea   Penicillins Rash    Outpatient Encounter  Medications as of 11/01/2020  Medication Sig   ALPRAZolam (XANAX) 0.25 MG tablet TAKE 1 TABLET(0.25 MG) BY MOUTH THREE TIMES DAILY   atorvastatin (LIPITOR) 20 MG tablet TAKE 1 TABLET(20 MG) BY MOUTH DAILY   chlorthalidone (HYGROTON) 25 MG tablet TAKE 1 TABLET(25 MG) BY MOUTH EVERY DAY   diclofenac Sodium (VOLTAREN) 1 % GEL diclofenac 1 % topical gel   Erenumab-aooe (AIMOVIG) 140 MG/ML SOAJ Every month   FLUoxetine (PROZAC) 20 MG capsule Take 2 capsules (40 mg total) by mouth daily.   fluticasone (FLONASE) 50 MCG/ACT nasal spray Place 2 sprays into both nostrils daily.   Fluticasone-Umeclidin-Vilant (TRELEGY ELLIPTA) 100-62.5-25 MCG/ACT AEPB Inhale 1 puff into the lungs daily.   montelukast (SINGULAIR) 10 MG tablet TAKE 1 TABLET(10 MG) BY MOUTH AT BEDTIME   nystatin (MYCOSTATIN) 100000 UNIT/ML suspension Take 5 mLs (500,000 Units total) by mouth 4 (four) times daily. Swish, gargle and spit x 1 week   nystatin-triamcinolone (MYCOLOG II) cream Apply twice a day prn   ondansetron (ZOFRAN) 4 MG tablet TAKE 1 TABLET(4 MG) BY MOUTH EVERY 8 HOURS AS NEEDED FOR NAUSEA OR VOMITING   pantoprazole (PROTONIX) 40 MG tablet Take 40 mg by mouth daily.   potassium chloride (MICRO-K) 10 MEQ CR capsule Take 2 capsules (20 mEq total) by mouth daily.   promethazine (PHENERGAN) 25 MG tablet TAKE 1 TABLET BY MOUTH EVERY 8 HOURS IF NEEDED   rizatriptan (MAXALT) 10 MG tablet Take 1 tablet (10 mg total) by mouth as needed for migraine. May repeat in 2 hours if needed   traZODone (DESYREL) 50 MG tablet Take 1 tablet (50 mg total) by mouth at  bedtime as needed for sleep.   albuterol (VENTOLIN HFA) 108 (90 Base) MCG/ACT inhaler Inhale into the lungs. (Patient not taking: Reported on 11/01/2020)   [EXPIRED] cefdinir (OMNICEF) 250 MG/5ML suspension Take 6 mLs (300 mg total) by mouth 2 (two) times daily for 7 days. (Patient not taking: Reported on 11/01/2020)   cyclobenzaprine (FLEXERIL) 10 MG tablet Take by mouth. (Patient not  taking: Reported on 11/01/2020)   dicyclomine (BENTYL) 20 MG tablet dicyclomine 20 mg tablet  TK 1 T PO BID PRN (Patient not taking: Reported on 11/01/2020)   SYNTHROID 25 MCG tablet Take 1 tablet (25 mcg total) by mouth daily before breakfast. (Patient not taking: No sig reported)   Facility-Administered Encounter Medications as of 11/01/2020  Medication   ipratropium-albuterol (DUONEB) 0.5-2.5 (3) MG/3ML nebulizer solution 3 mL    Patient Active Problem List   Diagnosis Date Noted   Aneurysm of infrarenal abdominal aorta 09/05/2020   Hypothyroidism (acquired) 07/10/2020   Prediabetes 07/10/2020   Ocular migraine with status migrainosus 07/10/2020   Seasonal allergies 06/16/2019   Essential hypertension 06/15/2019   Major depressive disorder, recurrent episode, mild ([place]) 09/10/2018   Mixed conductive and sensorineural hearing loss of left ear with restricted hearing of right ear 01/24/2017   Meniere's disease in remission, bilateral 01/24/2017   TOBACCO USER 11/25/2007   Dyslipidemia 06/05/2006   DIVERTICULOSIS, COLON 06/05/2006    Conditions to be addressed/monitored:COPD, smoking cessation, no advanced directives.   Care Plan : Wellness (Adult)  Updates made by Thana Ates, RN since 11/02/2020 12:00 AM     Problem: Health Literacy (Wellness)   Priority: High  Onset Date: 11/01/2020  Note:   Current Barriers:  Patient continues to smoke but wants to reduce amounts. Patient reports this is very difficult for her as she has smoked for a very long time. Patient with no advanced directives Clinical Goal(s):  Collaboration with Cox, Kirsten, MD regarding development and update of comprehensive plan of care as evidenced by provider attestation and co-signature Inter-disciplinary care team collaboration (see longitudinal plan of care) patient will work with care management team to address care coordination and chronic disease management needs related to Disease Management    Interventions:  Evaluation of current treatment plan related to COPD, smoking, hypertension ( patient reports under control) self-management and patient's adherence to plan as established by provider. Collaboration with Cox, Elnita Maxwell, MD regarding development and update of comprehensive plan of care as evidenced by provider attestation       and co-signature Inter-disciplinary care team collaboration (see longitudinal plan of care) Discussed plans with patient for ongoing care management follow up and provided patient with direct contact information for care management team Reviewed current medications Reviewed current smoking status and willingness to decrease smoking. Provided listening ear Mailed smoking cessation education material Mailed advance directive packet Mailed [facility] calendar  Self Care Activities:  Self administers medications as prescribed Attends all scheduled provider appointments Calls pharmacy for medication refills Patient Goals: - keep follow-up appointments  - continue to try to reduce smoking - eat healthy - get at least 7 to 8 hours of sleep at night  - review advance directive when they arrive in the mail  Follow Up Plan: Telephone follow up appointment with care management team member scheduled for:  12/01/2020    Care Plan : COPD (Adult)  Updates made by Thana Ates, RN since 11/02/2020 12:00 AM     Problem: Disease Progression/symptom management/ plan of care (COPD)   Priority:  High  Onset Date: 11/01/2020  Note:   Current Barriers:  Knowledge deficits related to basic understanding of COPD disease process. Patient interested in following up with pulmonary about severity of COPD.  Knowledge deficits related to basic COPD self care/management.  Unaware of COPD zones.  Patient interested in follow up with pulmonary Patient with recent COPD flare and concerns for extent of COPD  Case Manager Clinical Goal(s): patient will verbalize understanding of COPD  action plan and when to seek appropriate levels of medical care patient will verbalize basic understanding of COPD disease process and self care activities  Interventions:  Collaboration with Cox, Elnita Maxwell, MD regarding development and update of comprehensive plan of care as evidenced by provider attestation and co-signature Inter-disciplinary care team collaboration (see longitudinal plan of care) Provided patient with basic written and verbal COPD education on self care/management/and exacerbation prevention  Provided patient with COPD action plan and reinforced importance of daily self assessment Provided instruction about proper use of medications used for management of COPD including inhalers Advised patient to self assesses COPD action plan zone and make appointment with provider if in the yellow zone for 48 hours without improvement. Reviewed importance of nutrition in relationship to COPD. Reviewed, discussed and mailed COPD zones to patient Reviewed and discussed avoiding triggers Self-Care Activities:  Self administers medications as prescribed Attends all scheduled provider appointments Calls pharmacy for medication refills Calls provider office for new concerns or questions Patient Goals: - eat healthy - get at least 7 to 8 hours of sleep at night - eliminate symptom triggers at home - follow rescue plan if symptoms flare-up - keep follow-up appointments - limit outdoor activity during cold weather Follow Up Plan: Telephone follow up appointment with care management team member scheduled for:   12/01/2020     Plan:Telephone follow up appointment with care management team member scheduled for:  12/01/2020 Tomasa Rand RN, BSN, CEN RN Case Manager - [facility] [employer] exhibitions officer Mobile: [phone]

## 2020-11-03 ENCOUNTER — Telehealth: Payer: Self-pay

## 2020-11-03 NOTE — Telephone Encounter (Signed)
Patient was here on Monday and states she would like a refill on her cefdinir liquid as she is still very congested and was wanting to know if we have her chest x ray back.

## 2020-11-10 ENCOUNTER — Other Ambulatory Visit: Payer: Medicare PPO

## 2020-11-10 DIAGNOSIS — I10 Essential (primary) hypertension: Secondary | ICD-10-CM | POA: Diagnosis not present

## 2020-11-10 LAB — COMPREHENSIVE METABOLIC PANEL
ALT: 17 IU/L (ref 0–32)
AST: 14 IU/L (ref 0–40)
Albumin/Globulin Ratio: 2.1 (ref 1.2–2.2)
Albumin: 4.2 g/dL (ref 3.8–4.8)
Alkaline Phosphatase: 64 IU/L (ref 44–121)
BUN/Creatinine Ratio: 12 (ref 12–28)
BUN: 7 mg/dL — ABNORMAL LOW (ref 8–27)
Bilirubin Total: 0.3 mg/dL (ref 0.0–1.2)
CO2: 28 mmol/L (ref 20–29)
Calcium: 9.9 mg/dL (ref 8.7–10.3)
Chloride: 89 mmol/L — ABNORMAL LOW (ref 96–106)
Creatinine, Ser: 0.58 mg/dL (ref 0.57–1.00)
Globulin, Total: 2 g/dL (ref 1.5–4.5)
Glucose: 102 mg/dL — ABNORMAL HIGH (ref 70–99)
Potassium: 3.3 mmol/L — ABNORMAL LOW (ref 3.5–5.2)
Sodium: 132 mmol/L — ABNORMAL LOW (ref 134–144)
Total Protein: 6.2 g/dL (ref 6.0–8.5)
eGFR: 98 mL/min/{1.73_m2} (ref >59)

## 2020-11-14 ENCOUNTER — Other Ambulatory Visit: Payer: Self-pay | Admitting: Family Medicine

## 2020-11-16 ENCOUNTER — Encounter: Payer: Self-pay | Admitting: Family Medicine

## 2020-11-16 ENCOUNTER — Ambulatory Visit: Payer: Medicare PPO

## 2020-11-16 ENCOUNTER — Ambulatory Visit: Payer: Medicare PPO | Admitting: Family Medicine

## 2020-11-16 VITALS — BP 120/60 | HR 116 | Temp 97.6°F | Resp 16 | Ht 61.5 in | Wt 118.0 lb

## 2020-11-16 DIAGNOSIS — J329 Chronic sinusitis, unspecified: Secondary | ICD-10-CM | POA: Diagnosis not present

## 2020-11-16 MED ORDER — DOXYCYCLINE HYCLATE 100 MG PO TABS: 100.0000 mg | ORAL_TABLET | Freq: Two times a day (BID) | ORAL | 0 refills | Status: DC

## 2020-11-16 NOTE — Progress Notes (Signed)
Acute Office Visit  Subjective:    Patient ID: Allison Jimenez, female    DOB: 06-23-1950, 70 y.o.   MRN: [id number]  Chief Complaint  Patient presents with   Sinusitis   Cough    HPI: Patient is in today for nasal congestion, sinus pressure, cough, sore throat. She has been with this problems since 10/13/20. Patient denied fever, fatigue, chills, nauseas, abdominal pain.  Decreased to 8 cigarettes per day.  Using flonase, singulair, zyrtec.    Past Medical History:  Diagnosis Date   Acute blood loss anemia (ABLA) 09/05/2020   Allergy    ANXIETY 06/05/2006   Arthritis    SHOULDERS    BACK PAIN 01/05/2010   Cataract    bilateral, left worse   Centrilobular emphysema (HCC)    Centrilobular emphysema (HCC)    COPD (chronic obstructive pulmonary disease) ([place])    DIVERTICULOSIS, COLON 06/05/2006   DIZZINESS OR VERTIGO 06/05/2006   positional   Hearing loss in left ear    HYPERLIPIDEMIA 06/05/2006   HYPOKALEMIA 12/14/2008   MENIERE'S DISEASE 06/25/2006   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   Qualifier: Diagnosis of  By: Burnice Logan  MD, Doretha Sou    Migraine    TOBACCO USER 11/25/2007    Past Surgical History:  Procedure Laterality Date   APPENDECTOMY     CESAREAN SECTION     x1   CHOLECYSTECTOMY     COLONOSCOPY     COSMETIC SURGERY     mastoid surgery  1992   shunt in mastoid- endolymphatic sac in left ear    TUBAL LIGATION     vocal cord surgery     nodule x2    Family History  Problem Relation Age of Onset   Breast cancer Mother    Migraines Other    Depression Other    Anxiety disorder Other    High Cholesterol Other    Colon cancer Neg Hx    Rectal cancer Neg Hx    Stomach cancer Neg Hx    Colon polyps Neg Hx    Esophageal cancer Neg Hx     Social History   Socioeconomic History   Marital status: Divorced    Spouse name: Not on file   Number of children: 1   Years of education: 13   Highest education level: Not on file  Occupational History    Employer:  [employer]    Comment: Retired  Tobacco Use   Smoking status: Every Day    Packs/day: 0.50    Years: 43.00    Pack years: 21.50    Types: Cigarettes   Smokeless tobacco: Never   Tobacco comments:    using electronic cigs.-12 cigs a day as well  Vaping Use   Vaping Use: Some days   Substances: Nicotine  Substance and Sexual Activity   Alcohol use: No    Alcohol/week: 0.0 standard drinks   Drug use: No   Sexual activity: Not on file  Other Topics Concern   Not on file  Social History Narrative   Patient lives at home alone. Patient has a Data processing manager. Patient is divorced .   Patient is retired.   Education high school.   Right handed.   Caffeine none.   Social Determinants of Health   Financial Resource Strain: Not on file  Food Insecurity: Not on file  Transportation Needs: Not on file  Physical Activity: Not on file  Stress: Not on file  Social Connections: Not  on file  Intimate Partner Violence: Not on file    Outpatient Medications Prior to Visit  Medication Sig Dispense Refill   albuterol (VENTOLIN HFA) 108 (90 Base) MCG/ACT inhaler Inhale into the lungs.     ALPRAZolam (XANAX) 0.25 MG tablet TAKE 1 TABLET(0.25 MG) BY MOUTH THREE TIMES DAILY 90 tablet 1   atorvastatin (LIPITOR) 20 MG tablet TAKE 1 TABLET(20 MG) BY MOUTH DAILY 90 tablet 1   chlorthalidone (HYGROTON) 25 MG tablet TAKE 1 TABLET(25 MG) BY MOUTH EVERY DAY 90 tablet 4   diclofenac Sodium (VOLTAREN) 1 % GEL diclofenac 1 % topical gel 100 g 0   Erenumab-aooe (AIMOVIG) 140 MG/ML SOAJ Every month     FLUoxetine (PROZAC) 20 MG capsule Take 2 capsules (40 mg total) by mouth daily. 180 capsule 1   fluticasone (FLONASE) 50 MCG/ACT nasal spray Place 2 sprays into both nostrils daily. 16 g 6   Fluticasone-Umeclidin-Vilant (TRELEGY ELLIPTA) 100-62.5-25 MCG/ACT AEPB Inhale 1 puff into the lungs daily. 2 each 0   montelukast (SINGULAIR) 10 MG tablet TAKE 1 TABLET(10 MG) BY MOUTH AT BEDTIME 90  tablet 2   nystatin (MYCOSTATIN) 100000 UNIT/ML suspension Take 5 mLs (500,000 Units total) by mouth 4 (four) times daily. Swish, gargle and spit x 1 week 473 mL 0   nystatin-triamcinolone (MYCOLOG II) cream Apply twice a day prn 60 g 0   ondansetron (ZOFRAN) 4 MG tablet TAKE 1 TABLET(4 MG) BY MOUTH EVERY 8 HOURS AS NEEDED FOR NAUSEA OR VOMITING 90 tablet 0   potassium chloride (MICRO-K) 10 MEQ CR capsule Take 2 capsules (20 mEq total) by mouth daily. 180 capsule 0   promethazine (PHENERGAN) 25 MG tablet TAKE 1 TABLET BY MOUTH EVERY 8 HOURS IF NEEDED 20 tablet 0   rizatriptan (MAXALT) 10 MG tablet Take 1 tablet (10 mg total) by mouth as needed for migraine. May repeat in 2 hours if needed 10 tablet 0   SYNTHROID 25 MCG tablet Take 1 tablet (25 mcg total) by mouth daily before breakfast. 30 tablet 0   traZODone (DESYREL) 50 MG tablet Take 1 tablet (50 mg total) by mouth at bedtime as needed for sleep. 30 tablet 3   pantoprazole (PROTONIX) 40 MG tablet Take 40 mg by mouth daily.     cyclobenzaprine (FLEXERIL) 10 MG tablet Take by mouth. (Patient not taking: No sig reported)     dicyclomine (BENTYL) 20 MG tablet dicyclomine 20 mg tablet  TK 1 T PO BID PRN (Patient not taking: No sig reported)     ipratropium-albuterol (DUONEB) 0.5-2.5 (3) MG/3ML nebulizer solution 3 mL      No facility-administered medications prior to visit.    Allergies  Allergen Reactions   Erythromycin Base Diarrhea   Penicillins Rash    Review of Systems  Constitutional:  Negative for chills, fatigue and fever.  HENT:  Positive for congestion, postnasal drip and sinus pain. Negative for ear pain and sore throat.   Respiratory:  Positive for cough. Negative for shortness of breath.   Cardiovascular:  Negative for chest pain.  Gastrointestinal:  Negative for abdominal pain, constipation, diarrhea, nausea and vomiting.  Musculoskeletal:  Negative for myalgias.  Neurological:  Negative for headaches.      Objective:     Physical Exam Vitals reviewed.  Constitutional:      Appearance: Normal appearance. She is normal weight.  HENT:     Right Ear: Tympanic membrane, ear canal and external ear normal.     Left Ear: Tympanic  membrane, ear canal and external ear normal.     Nose: Congestion present.     Comments: Maxillary sinus tenderness    Mouth/Throat:     Pharynx: Oropharynx is clear.  Cardiovascular:     Rate and Rhythm: Normal rate and regular rhythm.     Heart sounds: Normal heart sounds. No murmur heard. Pulmonary:     Effort: Pulmonary effort is normal. No respiratory distress.     Breath sounds: Normal breath sounds.  Neurological:     Mental Status: She is alert and oriented to person, place, and time.  Psychiatric:        Mood and Affect: Mood normal.        Behavior: Behavior normal.    BP 120/60   Pulse (!) 116   Temp 97.6 F (36.4 C)   Resp 16   Ht 5' 1.5" (1.562 m)   Wt 118 lb (53.5 kg)   SpO2 98%   BMI 21.93 kg/m  Wt Readings from Last 3 Encounters:  11/16/20 118 lb (53.5 kg)  11/01/20 116 lb (52.6 kg)  10/25/20 115 lb (52.2 kg)    Health Maintenance Due  Topic Date Due   Zoster Vaccines- Shingrix (2 of 2) 06/22/2016   TETANUS/TDAP  05/17/2020    There are no preventive care reminders to display for this patient.   Lab Results  Component Value Date   TSH 4.110 11/29/2020   Lab Results  Component Value Date   WBC 10.5 11/29/2020   HGB 12.9 11/29/2020   HCT 38.4 11/29/2020   MCV 91 11/29/2020   PLT 364 11/29/2020   Lab Results  Component Value Date   NA 132 (L) 11/10/2020   K 3.3 (L) 11/10/2020   CO2 28 11/10/2020   GLUCOSE 102 (H) 11/10/2020   BUN 7 (L) 11/10/2020   CREATININE 0.58 11/10/2020   BILITOT 0.3 11/10/2020   ALKPHOS 64 11/10/2020   AST 14 11/10/2020   ALT 17 11/10/2020   PROT 6.2 11/10/2020   ALBUMIN 4.2 11/10/2020   CALCIUM 9.9 11/10/2020   EGFR 98 11/10/2020   GFR 75.48 07/09/2016   Lab Results  Component Value Date   CHOL  144 10/17/2020   Lab Results  Component Value Date   HDL 62 10/17/2020   Lab Results  Component Value Date   LDLCALC 63 10/17/2020   Lab Results  Component Value Date   TRIG 103 10/17/2020   Lab Results  Component Value Date   CHOLHDL 2.3 10/17/2020   Lab Results  Component Value Date   HGBA1C 6.0 (H) 07/05/2020       Assessment & Plan:   Problem List Items Addressed This Visit       Respiratory   Sinusitis - Primary    - symptoms and exam c/w sinusitis   - no evidence of AOM, CAP, strep pharyngitis, or other infection - given duration of symptoms, suspect bacterial etiology - will treat with Doxycycline 100 mg bid x 10 days       Relevant Medications   doxycycline (VIBRA-TABS) 100 MG tablet   Meds ordered this encounter  Medications   doxycycline (VIBRA-TABS) 100 MG tablet    Sig: Take 1 tablet (100 mg total) by mouth 2 (two) times daily.    Dispense:  20 tablet    Refill:  0    No orders of the defined types were placed in this encounter.    Follow-up: No follow-ups on file.  An After Visit Summary  was printed and given to the patient.  Rochel Brome, MD [facility] [phone]

## 2020-11-18 ENCOUNTER — Other Ambulatory Visit: Payer: Self-pay

## 2020-11-18 MED ORDER — PANTOPRAZOLE SODIUM 40 MG PO TBEC: 40.0000 mg | DELAYED_RELEASE_TABLET | Freq: Every day | ORAL | 0 refills | Status: DC

## 2020-11-18 NOTE — Telephone Encounter (Signed)
Pt requesting refill. Last script written by hospital.   Allison Jimenez, [facility] 11/18/20 9:16 AM

## 2020-11-29 ENCOUNTER — Other Ambulatory Visit: Payer: Medicare PPO

## 2020-11-29 ENCOUNTER — Encounter: Payer: Self-pay | Admitting: Family Medicine

## 2020-11-29 ENCOUNTER — Telehealth: Payer: Self-pay

## 2020-11-29 DIAGNOSIS — E039 Hypothyroidism, unspecified: Secondary | ICD-10-CM | POA: Diagnosis not present

## 2020-11-29 DIAGNOSIS — Z79899 Other long term (current) drug therapy: Secondary | ICD-10-CM

## 2020-11-29 DIAGNOSIS — D649 Anemia, unspecified: Secondary | ICD-10-CM

## 2020-11-30 DIAGNOSIS — J449 Chronic obstructive pulmonary disease, unspecified: Secondary | ICD-10-CM

## 2020-11-30 DIAGNOSIS — F17210 Nicotine dependence, cigarettes, uncomplicated: Secondary | ICD-10-CM

## 2020-11-30 DIAGNOSIS — I10 Essential (primary) hypertension: Secondary | ICD-10-CM

## 2020-11-30 LAB — CBC
Hematocrit: 38.4 % (ref 34.0–46.6)
Hemoglobin: 12.9 g/dL (ref 11.1–15.9)
MCH: 30.6 pg (ref 26.6–33.0)
MCHC: 33.6 g/dL (ref 31.5–35.7)
MCV: 91 fL (ref 79–97)
Platelets: 364 10*3/uL (ref 150–450)
RBC: 4.22 x10E6/uL (ref 3.77–5.28)
RDW: 13.6 % (ref 11.7–15.4)
WBC: 10.5 10*3/uL (ref 3.4–10.8)

## 2020-11-30 LAB — TSH: TSH: 4.11 u[IU]/mL (ref 0.450–4.500)

## 2020-11-30 NOTE — Assessment & Plan Note (Signed)
-   symptoms and exam c/w sinusitis   - no evidence of AOM, CAP, strep pharyngitis, or other infection - given duration of symptoms, suspect bacterial etiology - will treat with Doxycycline 100 mg bid x 10 days

## 2020-12-01 ENCOUNTER — Telehealth: Payer: Medicare PPO

## 2020-12-06 ENCOUNTER — Ambulatory Visit: Payer: Medicare PPO

## 2020-12-06 DIAGNOSIS — F17200 Nicotine dependence, unspecified, uncomplicated: Secondary | ICD-10-CM

## 2020-12-06 DIAGNOSIS — I10 Essential (primary) hypertension: Secondary | ICD-10-CM

## 2020-12-06 DIAGNOSIS — G43801 Other migraine, not intractable, with status migrainosus: Secondary | ICD-10-CM

## 2020-12-06 NOTE — Chronic Care Management (AMB) (Addendum)
Chronic Care Management   CCM RN Visit Note  12/06/2020 Name: Allison Jimenez MRN: [id number] DOB: 12-05-1950  Subjective: Allison Jimenez is a 70 y.o. year old female who is a primary care patient of Cox, Kirsten, MD. The care management team was consulted for assistance with disease management and care coordination needs.    Engaged with patient by telephone for follow up visit in response to provider referral for case management and/or care coordination services.   Consent to Services:  The patient was given information about Chronic Care Management services, agreed to services, and gave verbal consent prior to initiation of services.  Please see initial visit note for detailed documentation.   Patient agreed to services and verbal consent obtained.   Assessment: Review of patient past medical history, allergies, medications, health status, including review of consultants reports, laboratory and other test data, was performed as part of comprehensive evaluation and provision of chronic care management services.   SDOH (Social Determinants of Health) assessments and interventions performed:    CCM Care Plan  Allergies  Allergen Reactions   Erythromycin Base Diarrhea   Penicillins Rash    Outpatient Encounter Medications as of 12/06/2020  Medication Sig   meloxicam (MOBIC) 7.5 MG tablet Take 7.5 mg by mouth 2 (two) times daily.   albuterol (VENTOLIN HFA) 108 (90 Base) MCG/ACT inhaler Inhale into the lungs.   ALPRAZolam (XANAX) 0.25 MG tablet TAKE 1 TABLET(0.25 MG) BY MOUTH THREE TIMES DAILY   atorvastatin (LIPITOR) 20 MG tablet TAKE 1 TABLET(20 MG) BY MOUTH DAILY   chlorthalidone (HYGROTON) 25 MG tablet TAKE 1 TABLET(25 MG) BY MOUTH EVERY DAY   cyclobenzaprine (FLEXERIL) 10 MG tablet Take by mouth. (Patient not taking: No sig reported)   diclofenac Sodium (VOLTAREN) 1 % GEL diclofenac 1 % topical gel   dicyclomine (BENTYL) 20 MG tablet dicyclomine 20 mg tablet  TK 1 T PO BID PRN  (Patient not taking: No sig reported)   doxycycline (VIBRA-TABS) 100 MG tablet Take 1 tablet (100 mg total) by mouth 2 (two) times daily.   Erenumab-aooe (AIMOVIG) 140 MG/ML SOAJ Every month   FLUoxetine (PROZAC) 20 MG capsule Take 2 capsules (40 mg total) by mouth daily.   fluticasone (FLONASE) 50 MCG/ACT nasal spray Place 2 sprays into both nostrils daily.   Fluticasone-Umeclidin-Vilant (TRELEGY ELLIPTA) 100-62.5-25 MCG/ACT AEPB Inhale 1 puff into the lungs daily.   montelukast (SINGULAIR) 10 MG tablet TAKE 1 TABLET(10 MG) BY MOUTH AT BEDTIME   nystatin (MYCOSTATIN) 100000 UNIT/ML suspension Take 5 mLs (500,000 Units total) by mouth 4 (four) times daily. Swish, gargle and spit x 1 week   nystatin-triamcinolone (MYCOLOG II) cream Apply twice a day prn   ondansetron (ZOFRAN) 4 MG tablet TAKE 1 TABLET(4 MG) BY MOUTH EVERY 8 HOURS AS NEEDED FOR NAUSEA OR VOMITING   pantoprazole (PROTONIX) 40 MG tablet Take 1 tablet (40 mg total) by mouth daily.   potassium chloride (MICRO-K) 10 MEQ CR capsule Take 2 capsules (20 mEq total) by mouth daily.   promethazine (PHENERGAN) 25 MG tablet TAKE 1 TABLET BY MOUTH EVERY 8 HOURS IF NEEDED   rizatriptan (MAXALT) 10 MG tablet Take 1 tablet (10 mg total) by mouth as needed for migraine. May repeat in 2 hours if needed   SYNTHROID 25 MCG tablet Take 1 tablet (25 mcg total) by mouth daily before breakfast.   traZODone (DESYREL) 50 MG tablet Take 1 tablet (50 mg total) by mouth at bedtime as needed for sleep.  No facility-administered encounter medications on file as of 12/06/2020.    Patient Active Problem List   Diagnosis Date Noted   Aneurysm of infrarenal abdominal aorta 09/05/2020   Hypothyroidism (acquired) 07/10/2020   Prediabetes 07/10/2020   Ocular migraine with status migrainosus 07/10/2020   Seasonal allergies 06/16/2019   Essential hypertension 06/15/2019   Sinusitis 05/12/2019   Major depressive disorder, recurrent episode, mild ([place]) 09/10/2018    Mixed conductive and sensorineural hearing loss of left ear with restricted hearing of right ear 01/24/2017   Meniere's disease in remission, bilateral 01/24/2017   TOBACCO USER 11/25/2007   Dyslipidemia 06/05/2006   DIVERTICULOSIS, COLON 06/05/2006    Conditions to be addressed/monitored:COPD and Tobacco Use  Care Plan : Wellness (Adult)  Updates made by Thana Ates, RN since 12/06/2020 12:00 AM     Problem: Health Literacy (Wellness)   Priority: High  Onset Date: 11/01/2020  Note:   Current Barriers:  Patient continues to smoke but wants to reduce amounts. Patient reports this is very difficult for her as she has smoked for a very long time.12/06/2020 Reviewed with patient that she continues to smoke daily about 12 cigarettes per day. Patient with no advanced directives  12/06/2020  Reviewed with patient that she received advanced directive information via mail.  Patient reports that she has reviewed but has not completed.  HEADACHE TODAY. Patient reports significant headache today. She reports that she can not tell if it is MHA. Reports pain 7/10.  Is in the bed resting. Reports the change in weather makes her have more headaches. Clinical Goal(s):  Collaboration with Cox, Kirsten, MD regarding development and update of comprehensive plan of care as evidenced by provider attestation and co-signature Inter-disciplinary care team collaboration (see longitudinal plan of care) patient will work with care management team to address care coordination and chronic disease management needs related to Disease Management   Interventions:  Evaluation of current treatment plan related to COPD, smoking, hypertension ( patient reports under control) self-management and patient's adherence to plan as established by provider. Collaboration with Cox, Elnita Maxwell, MD regarding development and update of comprehensive plan of care as evidenced by provider attestation       and  co-signature Inter-disciplinary care team collaboration (see longitudinal plan of care) Discussed plans with patient for ongoing care management follow up and provided patient with direct contact information for care management team Reviewed current medications Reviewed current smoking status and willingness to decrease smoking. Provided listening ear Encouraged patient to review smoking cessation education material Encouraged patient to complete advanced directive packet Encouraged patient to seek medical attention if headache gets worse or any other neurological symptoms.  Self Care Activities:  Self administers medications as prescribed Attends all scheduled provider appointments Calls pharmacy for medication refills Patient Goals: - keep follow-up appointments  - continue to try to reduce smoking - eat healthy - get at least 7 to 8 hours of sleep at night  - review advance directive when they arrive in the mail  Follow Up Plan: Telephone follow up appointment with care management team member scheduled for:  03/07/2021    Care Plan : COPD (Adult)  Updates made by Thana Ates, RN since 12/06/2020 12:00 AM     Problem: Disease Progression/symptom management/ plan of care (COPD)   Priority: High  Onset Date: 11/01/2020  Note:   Current Barriers:  Knowledge deficits related to basic understanding of COPD disease process. Patient interested in following up with pulmonary about severity of COPD.  Knowledge deficits related to basic COPD self care/management.  12/06/2020  Reviewed with patient that she has the information that was mailed.  Patient interested in follow up with pulmonary Patient with recent COPD flare and concerns for extent of COPD  Case Manager Clinical Goal(s): patient will verbalize understanding of COPD action plan and when to seek appropriate levels of medical care patient will verbalize basic understanding of COPD disease process and self care activities   Interventions:  Collaboration with Cox, Elnita Maxwell, MD regarding development and update of comprehensive plan of care as evidenced by provider attestation and co-signature Inter-disciplinary care team collaboration (see longitudinal plan of care) Provided patient with basic written and verbal COPD education on self care/management/and exacerbation prevention  Provided patient with COPD action plan and reinforced importance of daily self assessment Provided instruction about proper use of medications used for management of COPD including inhalers Advised patient to self assesses COPD action plan zone and make appointment with provider if in the yellow zone for 48 hours without improvement. Reviewed importance of nutrition in relationship to COPD. Confirmed with patient that she has received information that was mailed to her. Encouraged patient to review and follow COPD zones.  Reviewed and discussed avoiding triggers Self-Care Activities:  Self administers medications as prescribed Attends all scheduled provider appointments Calls pharmacy for medication refills Calls provider office for new concerns or questions Patient Goals: - eat healthy - get at least 7 to 8 hours of sleep at night - eliminate symptom triggers at home - follow rescue plan if symptoms flare-up - keep follow-up appointments - limit outdoor activity during cold weather Follow Up Plan: Telephone follow up appointment with care management team member scheduled for:   03/07/2021     Plan:Telephone follow up appointment with care management team member scheduled for:  03/07/2021 Tomasa Rand RN, BSN, CEN RN Case Manager - [facility] [employer] exhibitions officer Mobile: [phone]

## 2020-12-06 NOTE — Patient Instructions (Signed)
Visit Information  Thank you for taking time to visit with me today. Please don't hesitate to contact me if I can be of assistance to you before our next scheduled telephone appointment.   Goals Addressed             This Visit's Progress    Manage Fatigue (Tiredness-COPD)   On track    Timeframe:  Long-Range Goal Priority:  High Start Date:               11/01/2020              Expected End Date:            11/01/2021  Follow Up Date 03/07/2021   - eat healthy - get at least 7 to 8 hours of sleep at night    Why is this important?   Feeling tired or worn out is a common symptom of COPD (chronic obstructive pulmonary disease).  Learning when you feel your best and when you need rest is important.  Managing the tiredness (fatigue) will help you be active and enjoy life.         No advanced directives   On track    Timeframe:  Long-Range Goal Priority:  High Start Date:            11/01/2020                 Expected End Date:      11/01/2021                  Follow Up Date 03/07/2021    - Review and complete advanced directives    Why is this important?   The best way to learn about your health and care is by talking to the doctor and nurse.  They will answer your questions and give you information in the way that you like best.        Track and Manage My Symptoms-COPD   On track    Timeframe:  Long-Range Goal Priority:  High Start Date:         11/01/2020                    Expected End Date:          11/01/2021             Follow Up Date 03/07/2021   - eliminate symptom triggers at home - follow rescue plan if symptoms flare-up - keep follow-up appointments  - continue to try to reduce smoking   Why is this important?   Tracking your symptoms and other information about your health helps your doctor plan your care.  Write down the symptoms, the time of day, what you were doing and what medicine you are taking.  You will soon learn how to manage your symptoms.               Our next appointment is by telephone on 03/07/2021   Please call the care guide team at [phone] if you need to cancel or reschedule your appointment.   If you are experiencing a Mental Health or [place] or need someone to talk to, please call the Suicide and Crisis Lifeline: 988 call the [place] National Suicide Prevention Lifeline: 480 866 8912 or TTY: (325)809-3377 TTY (806) 634-9091) to talk to a trained counselor call 1-800-273-TALK (toll free, 24 hour hotline) call 911   The patient verbalized understanding of instructions, educational materials, and care  plan provided today and declined offer to receive copy of patient instructions, educational materials, and care plan.   Tomasa Rand RN, BSN, CEN RN Case Freight forwarder - [facility] [employer] exhibitions officer Mobile: [phone]

## 2021-01-03 ENCOUNTER — Other Ambulatory Visit: Payer: Self-pay

## 2021-01-03 MED ORDER — ALPRAZOLAM 0.25 MG PO TABS: ORAL_TABLET | ORAL | 0 refills | Status: DC

## 2021-01-19 ENCOUNTER — Encounter: Payer: Self-pay | Admitting: Family Medicine

## 2021-01-19 ENCOUNTER — Ambulatory Visit (INDEPENDENT_AMBULATORY_CARE_PROVIDER_SITE_OTHER): Payer: Medicare PPO | Admitting: Family Medicine

## 2021-01-19 ENCOUNTER — Other Ambulatory Visit: Payer: Self-pay

## 2021-01-19 VITALS — BP 130/70 | HR 70 | Temp 97.5°F | Resp 14 | Ht 62.0 in | Wt 121.0 lb

## 2021-01-19 DIAGNOSIS — J410 Simple chronic bronchitis: Secondary | ICD-10-CM | POA: Diagnosis not present

## 2021-01-19 DIAGNOSIS — H8103 Meniere's disease, bilateral: Secondary | ICD-10-CM

## 2021-01-19 DIAGNOSIS — N761 Subacute and chronic vaginitis: Secondary | ICD-10-CM

## 2021-01-19 DIAGNOSIS — R7303 Prediabetes: Secondary | ICD-10-CM | POA: Diagnosis not present

## 2021-01-19 DIAGNOSIS — F17200 Nicotine dependence, unspecified, uncomplicated: Secondary | ICD-10-CM

## 2021-01-19 DIAGNOSIS — N3000 Acute cystitis without hematuria: Secondary | ICD-10-CM | POA: Diagnosis not present

## 2021-01-19 DIAGNOSIS — E039 Hypothyroidism, unspecified: Secondary | ICD-10-CM

## 2021-01-19 DIAGNOSIS — I10 Essential (primary) hypertension: Secondary | ICD-10-CM | POA: Diagnosis not present

## 2021-01-19 DIAGNOSIS — J449 Chronic obstructive pulmonary disease, unspecified: Secondary | ICD-10-CM | POA: Insufficient documentation

## 2021-01-19 DIAGNOSIS — H90A32 Mixed conductive and sensorineural hearing loss, unilateral, left ear with restricted hearing on the contralateral side: Secondary | ICD-10-CM

## 2021-01-19 DIAGNOSIS — E785 Hyperlipidemia, unspecified: Secondary | ICD-10-CM

## 2021-01-19 DIAGNOSIS — I7143 Infrarenal abdominal aortic aneurysm, without rupture: Secondary | ICD-10-CM

## 2021-01-19 DIAGNOSIS — F330 Major depressive disorder, recurrent, mild: Secondary | ICD-10-CM

## 2021-01-19 LAB — POCT URINALYSIS DIPSTICK
Bilirubin, UA: NEGATIVE
Blood, UA: NEGATIVE
Glucose, UA: NEGATIVE
Ketones, UA: NEGATIVE
Nitrite, UA: NEGATIVE
Protein, UA: NEGATIVE
Spec Grav, UA: 1.01 (ref 1.010–1.025)
Urobilinogen, UA: 0.2 E.U./dL
pH, UA: 8 (ref 5.0–8.0)

## 2021-01-19 MED ORDER — PREMARIN 0.625 MG/GM VA CREA: TOPICAL_CREAM | VAGINAL | 2 refills | Status: DC

## 2021-01-19 MED ORDER — AMLODIPINE BESYLATE 2.5 MG PO TABS: 2.5000 mg | ORAL_TABLET | Freq: Every day | ORAL | 0 refills | Status: DC

## 2021-01-19 NOTE — Telephone Encounter (Signed)
Allison Jimenez called to report the cost of the premarin cream was $359.  She did not pick-up the medication.  She dis speak to a [employer] representative who informed her that the medication is not covered on her insurance.  Dr. Tobie Poet advised Estrace cream to be applied daily for 2 weeks then twice weekly.

## 2021-01-19 NOTE — Assessment & Plan Note (Signed)
Continue to hold trelegy as pt has appointment in one week with pulmonology.

## 2021-01-19 NOTE — Progress Notes (Signed)
Subjective:  Patient ID: Allison Jimenez, female    DOB: 08/29/1950  Age: 71 y.o. MRN: [id number]  Chief Complaint  Patient presents with   Hyperlipidemia    Hyperlipidemia: Current medications: Lipitor 20 mg qd  Hypertension: Current medications: Chlorthalidone 25 mg qd. Also takes for menieres. Pt has had increase in dizziness. Patient checks her blood pressure daily.  Her blood pressures have been since early December.  Usually in the 140s to 160s over 60s to 80s.  She has had 1 and 120/60.  The systolic range is 169 - 678 and diastolic 60 - 80.  Diet: Eating healthy. Patient had abnormal TSH in over the summer.  She intermittently took Synthroid 25 mcg once daily.  She then ended up discontinuing it.  Her TSH came back 6 weeks after that within the normal range.  TSH was normal off medication in November 2022.  Migraines: Currently on Aimovig once monthly.  Patient also has Maxalt for acute treatment.  Mnire's disease: Currently on chlorthalidone 25 mg once daily.  Patient requesting to increase this however patient has had hyponatremia in the past And reluctant to do this.  Prediabetes: Last A1c was 6 in July 2022.  Generalized anxiety disorder/Mild recurrent depression: Patient is currently on Prozac 20 mg 2 daily and Xanax 0.25 mg 1 p.o. 3 times daily.  COPD: Patient is currently not on any inhalers.  She was on Trelegy 100-62.5-25 mcg per actuation 1 inhalation daily and Ventolin HFA 2 puffs 4 times a day as needed for wheezing or shortness of breath.  She does not feel she needs it.  She discontinued it and is scheduled to see a pulmonologist in the next 1 to 2 weeks.  She denies any increased shortness of breath, wheezing, or coughing.  She rarely uses her Ventolin.  Patient is also on Flonase and Singulair for allergic rhinitis.  Patient continues to smoke but says she is down to 10 cigarettes/day.  She does not have a strong desire to quit but knows she needs to.  Chronic  back and neck pain: Currently at baseline.  Takes meloxicam 7.5 mg twice daily and cyclobenzaprine 10 mg daily.  Current Outpatient Medications on File Prior to Visit  Medication Sig Dispense Refill   albuterol (VENTOLIN HFA) 108 (90 Base) MCG/ACT inhaler Inhale into the lungs.     ALPRAZolam (XANAX) 0.25 MG tablet One three times a day 90 tablet 0   atorvastatin (LIPITOR) 20 MG tablet TAKE 1 TABLET(20 MG) BY MOUTH DAILY 90 tablet 1   chlorthalidone (HYGROTON) 25 MG tablet TAKE 1 TABLET(25 MG) BY MOUTH EVERY DAY 90 tablet 4   cyclobenzaprine (FLEXERIL) 10 MG tablet Take by mouth. (Patient not taking: No sig reported)     diclofenac Sodium (VOLTAREN) 1 % GEL diclofenac 1 % topical gel 100 g 0   Erenumab-aooe (AIMOVIG) 140 MG/ML SOAJ Every month     FLUoxetine (PROZAC) 20 MG capsule Take 2 capsules (40 mg total) by mouth daily. 180 capsule 1   fluticasone (FLONASE) 50 MCG/ACT nasal spray Place 2 sprays into both nostrils daily. 16 g 6   Fluticasone-Umeclidin-Vilant (TRELEGY ELLIPTA) 100-62.5-25 MCG/ACT AEPB Inhale 1 puff into the lungs daily. 2 each 0   meloxicam (MOBIC) 7.5 MG tablet Take 7.5 mg by mouth 2 (two) times daily.     montelukast (SINGULAIR) 10 MG tablet TAKE 1 TABLET(10 MG) BY MOUTH AT BEDTIME 90 tablet 2   nystatin (MYCOSTATIN) 100000 UNIT/ML suspension Take 5 mLs (500,000  Units total) by mouth 4 (four) times daily. Swish, gargle and spit x 1 week 473 mL 0   nystatin-triamcinolone (MYCOLOG II) cream Apply twice a day prn 60 g 0   ondansetron (ZOFRAN) 4 MG tablet TAKE 1 TABLET(4 MG) BY MOUTH EVERY 8 HOURS AS NEEDED FOR NAUSEA OR VOMITING 90 tablet 0   pantoprazole (PROTONIX) 40 MG tablet Take 1 tablet (40 mg total) by mouth daily. 90 tablet 0   potassium chloride (MICRO-K) 10 MEQ CR capsule Take 2 capsules (20 mEq total) by mouth daily. 180 capsule 0   promethazine (PHENERGAN) 25 MG tablet TAKE 1 TABLET BY MOUTH EVERY 8 HOURS IF NEEDED 20 tablet 0   rizatriptan (MAXALT) 10 MG tablet  Take 1 tablet (10 mg total) by mouth as needed for migraine. May repeat in 2 hours if needed 10 tablet 0   traZODone (DESYREL) 50 MG tablet Take 1 tablet (50 mg total) by mouth at bedtime as needed for sleep. 30 tablet 3   No current facility-administered medications on file prior to visit.   Past Medical History:  Diagnosis Date   Acute blood loss anemia (ABLA) 09/05/2020   Allergy    ANXIETY 06/05/2006   Arthritis    SHOULDERS    BACK PAIN 01/05/2010   Cataract    bilateral, left worse   Centrilobular emphysema (HCC)    Centrilobular emphysema (HCC)    COPD (chronic obstructive pulmonary disease) ([place])    DIVERTICULOSIS, COLON 06/05/2006   DIZZINESS OR VERTIGO 06/05/2006   positional   Hearing loss in left ear    HYPERLIPIDEMIA 06/05/2006   HYPOKALEMIA 12/14/2008   MENIERE'S DISEASE 06/25/2006   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   Qualifier: Diagnosis of  By: Burnice Logan  MD, Doretha Sou    Migraine    TOBACCO USER 11/25/2007   Past Surgical History:  Procedure Laterality Date   APPENDECTOMY     CESAREAN SECTION     x1   CHOLECYSTECTOMY     COLONOSCOPY     COSMETIC SURGERY     mastoid surgery  1992   shunt in mastoid- endolymphatic sac in left ear    TUBAL LIGATION     vocal cord surgery     nodule x2    Family History  Problem Relation Age of Onset   Breast cancer Mother    Migraines Other    Depression Other    Anxiety disorder Other    High Cholesterol Other    Colon cancer Neg Hx    Rectal cancer Neg Hx    Stomach cancer Neg Hx    Colon polyps Neg Hx    Esophageal cancer Neg Hx    Social History   Socioeconomic History   Marital status: Divorced    Spouse name: Not on file   Number of children: 1   Years of education: 13   Highest education level: Not on file  Occupational History    Employer: [employer]    Comment: Retired  Tobacco Use   Smoking status: Every Day    Packs/day: 0.50    Years: 43.00    Pack years: 21.50    Types: Cigarettes    Smokeless tobacco: Never   Tobacco comments:    using electronic cigs.-12 cigs a day as well  Vaping Use   Vaping Use: Some days   Substances: Nicotine  Substance and Sexual Activity   Alcohol use: No    Alcohol/week: 0.0 standard drinks   Drug use: No  Sexual activity: Not on file  Other Topics Concern   Not on file  Social History Narrative   Patient lives at home alone. Patient has a Data processing manager. Patient is divorced .   Patient is retired.   Education high school.   Right handed.   Caffeine none.   Social Determinants of Health   Financial Resource Strain: Not on file  Food Insecurity: Not on file  Transportation Needs: Not on file  Physical Activity: Not on file  Stress: Not on file  Social Connections: Not on file    Review of Systems  Constitutional:  Positive for fatigue. Negative for chills and fever.  HENT:  Negative for congestion, rhinorrhea and sore throat.   Respiratory:  Negative for cough and shortness of breath.   Cardiovascular:  Negative for chest pain.  Gastrointestinal:  Negative for abdominal pain, constipation, diarrhea, nausea and vomiting.  Genitourinary:  Positive for dysuria. Negative for urgency.  Musculoskeletal:  Positive for back pain. Negative for myalgias.  Neurological:  Positive for headaches. Negative for dizziness, weakness and light-headedness.  Psychiatric/Behavioral:  Negative for dysphoric mood. The patient is not nervous/anxious.     Objective:  BP 130/70    Pulse 70    Temp (!) 97.5 F (36.4 C)    Resp 14    Ht 5\' 2"  (1.575 m)    Wt 121 lb (54.9 kg)    BMI 22.13 kg/m   BP/Weight 01/19/2021 11/16/2020 14/04/3152  Systolic BP 008 676 195  Diastolic BP 70 60 64  Wt. (Lbs) 121 118 116  BMI 22.13 21.93 21.56    Physical Exam Vitals reviewed.  Constitutional:      General: She is not in acute distress.    Appearance: Normal appearance. She is normal weight.  HENT:     Right Ear: Tympanic membrane and ear canal  normal.     Left Ear: Tympanic membrane and ear canal normal.     Nose: Nose normal. No congestion or rhinorrhea.  Eyes:     Conjunctiva/sclera: Conjunctivae normal.  Neck:     Thyroid: No thyroid mass.     Vascular: No carotid bruit.  Cardiovascular:     Rate and Rhythm: Normal rate and regular rhythm.     Pulses: Normal pulses.     Heart sounds: Normal heart sounds. No murmur heard. Pulmonary:     Effort: Pulmonary effort is normal.     Breath sounds: Normal breath sounds.  Abdominal:     General: Bowel sounds are normal.     Palpations: Abdomen is soft. There is no mass.     Tenderness: There is no abdominal tenderness.  Lymphadenopathy:     Cervical: No cervical adenopathy.  Neurological:     Mental Status: She is alert and oriented to person, place, and time.     Cranial Nerves: No cranial nerve deficit.  Psychiatric:        Mood and Affect: Mood normal.        Behavior: Behavior normal.    Diabetic Foot Exam - Simple   No data filed      Lab Results  Component Value Date   WBC 10.5 11/29/2020   HGB 12.9 11/29/2020   HCT 38.4 11/29/2020   PLT 364 11/29/2020   GLUCOSE 102 (H) 11/10/2020   CHOL 144 10/17/2020   TRIG 103 10/17/2020   HDL 62 10/17/2020   LDLDIRECT 151.9 09/13/2011   LDLCALC 63 10/17/2020   ALT 17 11/10/2020  AST 14 11/10/2020   NA 132 (L) 11/10/2020   K 3.3 (L) 11/10/2020   CL 89 (L) 11/10/2020   CREATININE 0.58 11/10/2020   BUN 7 (L) 11/10/2020   CO2 28 11/10/2020   TSH 4.110 11/29/2020   HGBA1C 6.0 (H) 07/05/2020      Assessment & Plan:   Problem List Items Addressed This Visit       Cardiovascular and Mediastinum   Essential hypertension    Not at goal Medicine: Add amlodipine 2.5 mg once daily.  Continue chlorthalidone 25 mg daily.  Check bp daily. Continue to work on eating a healthy diet and exercise.  Labs drawn today.         Relevant Medications   amLODipine (NORVASC) 2.5 MG tablet   Other Relevant Orders   CBC  with Differential/Platelet   Comprehensive metabolic panel     Respiratory   COPD (chronic obstructive pulmonary disease) ([place])    Continue to hold trelegy as pt has appointment in one week with pulmonology.         Endocrine   Hypothyroidism (acquired)    Not on any medicine tsh normal 11/2020 without treatment.      Relevant Orders   TSH   T4, free     Nervous and Auditory   Mixed conductive and sensorineural hearing loss of left ear with restricted hearing of right ear    Has hearing aids      Meniere's disease in remission, bilateral    Continue chlorthalidone        Genitourinary   Chronic vaginitis    Ordered Nuswab vg for candida  Consider atrophic vaginitis. start on premarin cream once daily x 2 weeks, then decrease to one twice a week. Stop other cream. It may be making it worse.  The pharmacy call back and progress cover.  Sent Estrace cream.      Relevant Orders   NuSwab VG, Candida 6sp   Acute cystitis without hematuria - Primary    Ordered UA and urine culture. Waiting for the results.  Came back with no growth.      Relevant Orders   Urine Culture (Completed)   POCT urinalysis dipstick (Completed)     Other   Major depressive disorder, recurrent episode, mild (HCC)    The current medical regimen is effective;  continue present plan and medications.       Dyslipidemia    Well controlled.  No changes to medicines.  Continue to work on eating a healthy diet and exercise.  Labs drawn today.       Relevant Orders   Lipid panel   TOBACCO USER    Recommend cessation. Good job cutting down. Keep it up.  Recommend continue to work on eating healthy diet and exercise.      Prediabetes    Hemoglobin A1c 6.0, 3 month avg of blood sugars, is in prediabetic range.  In order to prevent progression to diabetes, recommend low carb diet and regular exercise      Relevant Orders   Hemoglobin A1c  .  Meds ordered this encounter  Medications    amLODipine (NORVASC) 2.5 MG tablet    Sig: Take 1 tablet (2.5 mg total) by mouth daily.    Dispense:  90 tablet    Refill:  0   DISCONTD: conjugated estrogens (PREMARIN) vaginal cream    Sig: As directed in clinic.    Dispense:  42.5 g    Refill:  2  Plan to have her use it daily x 2 weeks then twice a week.    Orders Placed This Encounter  Procedures   Urine Culture   CBC with Differential/Platelet   Comprehensive metabolic panel   Hemoglobin A1c   Lipid panel   TSH   T4, free   POCT urinalysis dipstick     I,Anijah Spohr,acting as a scribe for Rochel Brome, MD.,have documented all relevant documentation on the behalf of Rochel Brome, MD,as directed by  Rochel Brome, MD while in the presence of Rochel Brome, MD.   Follow-up: Return in about 3 months (around 04/19/2021) for chronic fasting.  An After Visit Summary was printed and given to the patient.  Rochel Brome, MD [facility] [phone]

## 2021-01-19 NOTE — Patient Instructions (Addendum)
COPD: Continue to hold trelegy as pt has appointment in one week with pulmonology.  Atrophic vaginitis: start on premarin cream once daily x 2 weeks, then decrease to one twice a week. Stop other cream. It may be making it worse.  Hypertension: add amlodipine 2.5 mg once daily. Check bp daily.  Tobacco use: recommend cessation. Good job cutting down. Keep it up.  Recommend continue to work on eating healthy diet and exercise.

## 2021-01-20 LAB — URINE CULTURE: Organism ID, Bacteria: NO GROWTH

## 2021-01-20 MED ORDER — ESTRADIOL 0.1 MG/GM VA CREA
1.0000 | TOPICAL_CREAM | Freq: Every day | VAGINAL | 0 refills | Status: DC
Start: 2021-01-20 — End: 2022-07-31

## 2021-01-21 DIAGNOSIS — N3000 Acute cystitis without hematuria: Secondary | ICD-10-CM | POA: Insufficient documentation

## 2021-01-21 DIAGNOSIS — N761 Subacute and chronic vaginitis: Secondary | ICD-10-CM | POA: Insufficient documentation

## 2021-01-21 NOTE — Assessment & Plan Note (Signed)
Recommend cessation. Good job cutting down. Keep it up.  Recommend continue to work on eating healthy diet and exercise.

## 2021-01-21 NOTE — Assessment & Plan Note (Addendum)
Ordered UA and urine culture. Waiting for the results.  Came back with no growth.

## 2021-01-21 NOTE — Assessment & Plan Note (Addendum)
Not on any medicine tsh normal 11/2020 without treatment.

## 2021-01-21 NOTE — Assessment & Plan Note (Addendum)
Not at goal Medicine: Add amlodipine 2.5 mg once daily.  Continue chlorthalidone 25 mg daily.  Check bp daily. Continue to work on eating a healthy diet and exercise.  Labs drawn today.

## 2021-01-21 NOTE — Assessment & Plan Note (Signed)
Hemoglobin A1c 6.0%, 3 month avg of blood sugars, is in prediabetic range.  In order to prevent progression to diabetes, recommend low carb diet and regular exercise 

## 2021-01-21 NOTE — Assessment & Plan Note (Addendum)
Ordered Nuswab vg for candida  Consider atrophic vaginitis. start on premarin cream once daily x 2 weeks, then decrease to one twice a week. Stop other cream. It may be making it worse.  The pharmacy call back and progress cover.  Sent Estrace cream.

## 2021-01-21 NOTE — Assessment & Plan Note (Deleted)
The current medical regimen is effective;  continue present plan and medications.  

## 2021-01-21 NOTE — Assessment & Plan Note (Signed)
Well controlled.  ?No changes to medicines.  ?Continue to work on eating a healthy diet and exercise.  ?Labs drawn today.  ?

## 2021-01-22 NOTE — Assessment & Plan Note (Signed)
Has hearing aids.

## 2021-01-22 NOTE — Assessment & Plan Note (Signed)
The current medical regimen is effective;  continue present plan and medications.  

## 2021-01-22 NOTE — Assessment & Plan Note (Signed)
Continue chlorthalidone.  ?

## 2021-01-23 LAB — CBC WITH DIFFERENTIAL/PLATELET
Basophils Absolute: 0.1 10*3/uL (ref 0.0–0.2)
Basos: 1 %
EOS (ABSOLUTE): 0.3 10*3/uL (ref 0.0–0.4)
Eos: 3 %
Hematocrit: 39.7 % (ref 34.0–46.6)
Hemoglobin: 13.3 g/dL (ref 11.1–15.9)
Immature Grans (Abs): 0 10*3/uL (ref 0.0–0.1)
Immature Granulocytes: 1 %
Lymphocytes Absolute: 2.3 10*3/uL (ref 0.7–3.1)
Lymphs: 27 %
MCH: 30.5 pg (ref 26.6–33.0)
MCHC: 33.5 g/dL (ref 31.5–35.7)
MCV: 91 fL (ref 79–97)
Monocytes Absolute: 0.7 10*3/uL (ref 0.1–0.9)
Monocytes: 8 %
Neutrophils Absolute: 5.1 10*3/uL (ref 1.4–7.0)
Neutrophils: 60 %
Platelets: 344 10*3/uL (ref 150–450)
RBC: 4.36 x10E6/uL (ref 3.77–5.28)
RDW: 11.9 % (ref 11.7–15.4)
WBC: 8.6 10*3/uL (ref 3.4–10.8)

## 2021-01-23 LAB — NUSWAB VG, CANDIDA 6SP
C PARAPSILOSIS/TROPICALIS: NEGATIVE
Candida albicans, NAA: NEGATIVE
Candida glabrata, NAA: NEGATIVE
Candida krusei, NAA: NEGATIVE
Candida lusitaniae, NAA: NEGATIVE
Trich vag by NAA: NEGATIVE

## 2021-01-23 LAB — COMPREHENSIVE METABOLIC PANEL
ALT: 15 IU/L (ref 0–32)
AST: 20 IU/L (ref 0–40)
Albumin/Globulin Ratio: 2.3 — ABNORMAL HIGH (ref 1.2–2.2)
Albumin: 4.8 g/dL (ref 3.8–4.8)
Alkaline Phosphatase: 60 IU/L (ref 44–121)
BUN/Creatinine Ratio: 16 (ref 12–28)
BUN: 10 mg/dL (ref 8–27)
Bilirubin Total: 0.5 mg/dL (ref 0.0–1.2)
CO2: 27 mmol/L (ref 20–29)
Calcium: 10.2 mg/dL (ref 8.7–10.3)
Chloride: 93 mmol/L — ABNORMAL LOW (ref 96–106)
Creatinine, Ser: 0.63 mg/dL (ref 0.57–1.00)
Globulin, Total: 2.1 g/dL (ref 1.5–4.5)
Glucose: 104 mg/dL — ABNORMAL HIGH (ref 70–99)
Potassium: 3.8 mmol/L (ref 3.5–5.2)
Sodium: 132 mmol/L — ABNORMAL LOW (ref 134–144)
Total Protein: 6.9 g/dL (ref 6.0–8.5)
eGFR: 96 mL/min/{1.73_m2} (ref >59)

## 2021-01-23 LAB — T4, FREE: Free T4: 1.01 ng/dL (ref 0.82–1.77)

## 2021-01-23 LAB — LIPID PANEL
Chol/HDL Ratio: 2.8 ratio (ref 0.0–4.4)
Cholesterol, Total: 144 mg/dL (ref 100–199)
HDL: 52 mg/dL (ref >39)
LDL Chol Calc (NIH): 67 mg/dL (ref 0–99)
Triglycerides: 148 mg/dL (ref 0–149)
VLDL Cholesterol Cal: 25 mg/dL (ref 5–40)

## 2021-01-23 LAB — CARDIOVASCULAR RISK ASSESSMENT

## 2021-01-23 LAB — HEMOGLOBIN A1C
Est. average glucose Bld gHb Est-mCnc: 117 mg/dL
Hgb A1c MFr Bld: 5.7 % — ABNORMAL HIGH (ref 4.8–5.6)

## 2021-01-23 LAB — TSH: TSH: 3.37 u[IU]/mL (ref 0.450–4.500)

## 2021-01-24 NOTE — Assessment & Plan Note (Signed)
3.6 cm in diameter 08/25/2020.  Repeat ultrasound every 2 yrs.

## 2021-01-24 NOTE — Progress Notes (Signed)
Blood count normal.  Liver function normal.  Kidney function normal.  Sodium little low, but stable. Thyroid function normal.  Cholesterol: good HBA1C: improved. 5.7. No evidence of yeast or bacterial vaginal infection.  Vaginitis is c/w atrophic vaginitis.

## 2021-01-25 NOTE — Progress Notes (Signed)
Synopsis: Referred for COPD exacerbation by Rip Harbour, NP  Subjective:   PATIENT ID: Allison Jimenez: female DOB: [birth year], MRN: [id number]  Chief Complaint  Patient presents with   Pulmonary Consult    Referred by Dr Rochel Brome. Pt states she was dxed with COPD and also has multiple nodules on low dose chest ct. She states overall not having any respiratory symptoms.    71yF with history of COPD/emphysema, 22 py smoking including ecigs, meniere's, referred by Jerrell Belfast following AECOPD 11/01/20.  She has been prescribed trelegy - but she is not using it. Doesn't feel like she needs it.   Some DOE when she goes up long incline. She doesn't necessarily think it's worse than other patients her age without lung disease. She does have cough but not especially bothersome.   She says she thinks she has allergic rhinitis   She is currently enrolled in our lung cancer screening program.  Otherwise pertinent review of systems is negative.  Past Medical History:  Diagnosis Date   Acute blood loss anemia (ABLA) 09/05/2020   Allergy    ANXIETY 06/05/2006   Arthritis    SHOULDERS    BACK PAIN 01/05/2010   Cataract    bilateral, left worse   Centrilobular emphysema (HCC)    Centrilobular emphysema (HCC)    COPD (chronic obstructive pulmonary disease) ([place])    DIVERTICULOSIS, COLON 06/05/2006   DIZZINESS OR VERTIGO 06/05/2006   positional   Hearing loss in left ear    HYPERLIPIDEMIA 06/05/2006   HYPOKALEMIA 12/14/2008   MENIERE'S DISEASE 06/25/2006   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   Qualifier: Diagnosis of  By: Burnice Logan  MD, Doretha Sou    Migraine    TOBACCO USER 11/25/2007     Family History  Problem Relation Age of Onset   Breast cancer Mother    Migraines Other    Depression Other    Anxiety disorder Other    High Cholesterol Other    Colon cancer Neg Hx    Rectal cancer Neg Hx    Stomach cancer Neg Hx    Colon polyps Neg Hx    Esophageal cancer Neg Hx       Past Surgical History:  Procedure Laterality Date   APPENDECTOMY     CESAREAN SECTION     x1   CHOLECYSTECTOMY     COLONOSCOPY     COSMETIC SURGERY     mastoid surgery  1992   shunt in mastoid- endolymphatic sac in left ear    TUBAL LIGATION     vocal cord surgery     nodule x2    Social History   Socioeconomic History   Marital status: Divorced    Spouse name: Not on file   Number of children: 1   Years of education: 13   Highest education level: Not on file  Occupational History    Employer: [employer]    Comment: Retired  Tobacco Use   Smoking status: Every Day    Packs/day: 1.50    Years: 49.00    Pack years: 73.50    Types: Cigarettes   Smokeless tobacco: Never   Tobacco comments:    Currently 1/2 ppd, uses vape   Vaping Use   Vaping Use: Some days   Start date: 01/01/2010   Substances: Nicotine   Devices: menthol/fruit flavor  Substance and Sexual Activity   Alcohol use: No    Alcohol/week: 0.0 standard drinks   Drug use:  No   Sexual activity: Not on file  Other Topics Concern   Not on file  Social History Narrative   Patient lives at home alone. Patient has a Data processing manager. Patient is divorced .   Patient is retired.   Education high school.   Right handed.   Caffeine none.   Social Determinants of Health   Financial Resource Strain: Not on file  Food Insecurity: Not on file  Transportation Needs: Not on file  Physical Activity: Not on file  Stress: Not on file  Social Connections: Not on file  Intimate Partner Violence: Not on file     Allergies  Allergen Reactions   Erythromycin Base Diarrhea   Penicillins Rash     Outpatient Medications Prior to Visit  Medication Sig Dispense Refill   albuterol (VENTOLIN HFA) 108 (90 Base) MCG/ACT inhaler Inhale into the lungs.     ALPRAZolam (XANAX) 0.25 MG tablet One three times a day 90 tablet 0   amLODipine (NORVASC) 2.5 MG tablet Take 1 tablet (2.5 mg total) by mouth  daily. 90 tablet 0   atorvastatin (LIPITOR) 20 MG tablet TAKE 1 TABLET(20 MG) BY MOUTH DAILY 90 tablet 1   chlorthalidone (HYGROTON) 25 MG tablet TAKE 1 TABLET(25 MG) BY MOUTH EVERY DAY 90 tablet 4   cyclobenzaprine (FLEXERIL) 10 MG tablet Take by mouth.     diclofenac Sodium (VOLTAREN) 1 % GEL diclofenac 1 % topical gel 100 g 0   Erenumab-aooe (AIMOVIG) 140 MG/ML SOAJ Every month     estradiol (ESTRACE) 0.1 MG/GM vaginal cream Place 1 Applicatorful vaginally at bedtime. Daily for 14 days then twice weekly 42.5 g 0   FLUoxetine (PROZAC) 20 MG capsule Take 2 capsules (40 mg total) by mouth daily. 180 capsule 1   fluticasone (FLONASE) 50 MCG/ACT nasal spray Place 2 sprays into both nostrils daily. 16 g 6   meloxicam (MOBIC) 7.5 MG tablet Take 7.5 mg by mouth 2 (two) times daily.     montelukast (SINGULAIR) 10 MG tablet TAKE 1 TABLET(10 MG) BY MOUTH AT BEDTIME 90 tablet 2   nystatin (MYCOSTATIN) 100000 UNIT/ML suspension Take 5 mLs (500,000 Units total) by mouth 4 (four) times daily. Swish, gargle and spit x 1 week 473 mL 0   nystatin-triamcinolone (MYCOLOG II) cream Apply twice a day prn 60 g 0   ondansetron (ZOFRAN) 4 MG tablet TAKE 1 TABLET(4 MG) BY MOUTH EVERY 8 HOURS AS NEEDED FOR NAUSEA OR VOMITING 90 tablet 0   pantoprazole (PROTONIX) 40 MG tablet Take 1 tablet (40 mg total) by mouth daily. 90 tablet 0   potassium chloride (MICRO-K) 10 MEQ CR capsule Take 2 capsules (20 mEq total) by mouth daily. 180 capsule 0   promethazine (PHENERGAN) 25 MG tablet TAKE 1 TABLET BY MOUTH EVERY 8 HOURS IF NEEDED 20 tablet 0   rizatriptan (MAXALT) 10 MG tablet Take 1 tablet (10 mg total) by mouth as needed for migraine. May repeat in 2 hours if needed 10 tablet 0   traZODone (DESYREL) 50 MG tablet Take 1 tablet (50 mg total) by mouth at bedtime as needed for sleep. 30 tablet 3   Fluticasone-Umeclidin-Vilant (TRELEGY ELLIPTA) 100-62.5-25 MCG/ACT AEPB Inhale 1 puff into the lungs daily. 2 each 0   No  facility-administered medications prior to visit.       Objective:   Physical Exam:  General appearance: 71 y.o., female, NAD, conversant  Eyes: anicteric sclerae; PERRL, tracking appropriately HENT: NCAT; MMM Neck: Trachea midline; no  lymphadenopathy, no JVD Lungs: CTAB, no crackles, no wheeze, with normal respiratory effort CV: RRR, no murmur  Abdomen: Soft, non-tender; non-distended, BS present  Extremities: No peripheral edema, warm Skin: Normal turgor and texture; no rash Psych: Appropriate affect Neuro: Alert and oriented to person and place, no focal deficit     Vitals:   01/26/21 1400  BP: 138/72  Pulse: 86  Temp: 97.7 F (36.5 C)  TempSrc: Oral  SpO2: 99%  Weight: 122 lb (55.3 kg)  Height: 5\' 2"  (1.575 m)   99% on RA BMI Readings from Last 3 Encounters:  01/26/21 22.31 kg/m  01/19/21 22.13 kg/m  11/16/20 21.93 kg/m   Wt Readings from Last 3 Encounters:  01/26/21 122 lb (55.3 kg)  01/19/21 121 lb (54.9 kg)  11/16/20 118 lb (53.5 kg)     CBC    Component Value Date/Time   WBC 8.6 01/19/2021 1152   WBC 9.2 07/14/2020 1145   RBC 4.36 01/19/2021 1152   RBC 4.20 07/14/2020 1145   HGB 13.3 01/19/2021 1152   HCT 39.7 01/19/2021 1152   PLT 344 01/19/2021 1152   MCV 91 01/19/2021 1152   MCH 30.5 01/19/2021 1152   MCH 28.6 07/14/2020 1145   MCHC 33.5 01/19/2021 1152   MCHC 33.6 07/14/2020 1145   RDW 11.9 01/19/2021 1152   LYMPHSABS 2.3 01/19/2021 1152   MONOABS 0.7 07/14/2020 1145   EOSABS 0.3 01/19/2021 1152   BASOSABS 0.1 01/19/2021 1152    Eos 100-300  Chest Imaging: CT 05/06/20 reviewed by me with emphysema, small <16mm nodules  Pulmonary Functions Testing Results: No flowsheet data found.  Echocardiogram:   TTE 2021 impaired relaxation     Assessment & Plan:   # COPD gold functional group E: # Emphysema  # Smoking:  # Pulmonary nodules  Plan: - encouraged use of trelegy to decrease risk of exacerbations, improved  mortality with advanced COPD, declines controller inhaler for now at least until we have PFT - we had discussion regarding benefits of smoking cessation including decreased risk of lung cancer death twice as effective as lung cancer screening alone, decreased risk of emphysema progression, and variety of pharmacotherapies to support cessation. Will try to gradually cut back on her own - continue lung cancer screening - PFTs in 6 weeks and clinic appointment  I spent 48 minutes dedicated to the care of this patient on the date of this encounter to include pre-visit review of records, face-to-face time with the patient discussing conditions above, post visit ordering of testing, clinical documentation with the electronic health record, making appropriate referrals as documented, and communicating necessary findings to members of the patients care team.    [name], MD [facility] 01/26/2021 2:25 PM

## 2021-01-26 ENCOUNTER — Encounter: Payer: Self-pay | Admitting: Student

## 2021-01-26 ENCOUNTER — Ambulatory Visit: Payer: Medicare PPO | Admitting: Student

## 2021-01-26 ENCOUNTER — Telehealth: Payer: Self-pay

## 2021-01-26 ENCOUNTER — Other Ambulatory Visit: Payer: Self-pay

## 2021-01-26 VITALS — BP 138/72 | HR 86 | Temp 97.7°F | Ht 62.0 in | Wt 122.0 lb

## 2021-01-26 DIAGNOSIS — J432 Centrilobular emphysema: Secondary | ICD-10-CM

## 2021-01-26 DIAGNOSIS — F17200 Nicotine dependence, unspecified, uncomplicated: Secondary | ICD-10-CM

## 2021-01-26 DIAGNOSIS — R918 Other nonspecific abnormal finding of lung field: Secondary | ICD-10-CM

## 2021-01-26 DIAGNOSIS — F17210 Nicotine dependence, cigarettes, uncomplicated: Secondary | ICD-10-CM

## 2021-01-26 DIAGNOSIS — IMO0001 Reserved for inherently not codable concepts without codable children: Secondary | ICD-10-CM

## 2021-01-26 NOTE — Telephone Encounter (Signed)
Left VM for return cal.   [name], [place] 01/26/21 2:21 PM

## 2021-01-26 NOTE — Patient Instructions (Addendum)
-   Keep up the good work trying to quit smoking - See you in 6 weeks for breathing tests and clinic appointment

## 2021-01-26 NOTE — Telephone Encounter (Signed)
Patient has been having abd cramping like menstrual cramps started about 4 days ago. She started estradiol 6 days ago and has been using vaseline as lubricant. She has also been applying nystatin cream. She read in pamphlet the cramping could be normal with estradiol but wanted dr aware.   [name], [place] 01/26/21 9:26 AM

## 2021-01-26 NOTE — Telephone Encounter (Signed)
Patient returned call. She did have some constipation but was successful yesterday and today. Stools were normal. She has had cramping today but not as bad.   [name], [place] 01/26/21 4:08 PM

## 2021-01-27 NOTE — Telephone Encounter (Signed)
Made patient aware.   Allison Jimenez 01/27/21 12:31 PM

## 2021-01-27 NOTE — Telephone Encounter (Signed)
Attempted to call patient. Left VM for return call.   Allison Jimenez, [place] 01/27/21 11:15 AM

## 2021-02-03 DIAGNOSIS — M545 Low back pain, unspecified: Secondary | ICD-10-CM | POA: Diagnosis not present

## 2021-02-03 DIAGNOSIS — M546 Pain in thoracic spine: Secondary | ICD-10-CM | POA: Diagnosis not present

## 2021-02-06 ENCOUNTER — Telehealth: Payer: Self-pay | Admitting: Student

## 2021-02-06 ENCOUNTER — Other Ambulatory Visit: Payer: Self-pay | Admitting: Family Medicine

## 2021-02-07 NOTE — Telephone Encounter (Signed)
Called and spoke with patient. She wanted to let us know that someone from [facility] will call our office or send [place] a fax in regards to her Trelegy. [employer] has enrolled her into an inhaler support group that helps with the copays for inhalers. She had to pay $312 for the first month and then the cost will go down to $42 a month. I reviewed her chart and advised her that it does not look like anyone has called but they may have faxed forms to our office.   She is aware that I will check for a fax.   Nothing further needed at time of call.

## 2021-02-08 ENCOUNTER — Encounter: Payer: Self-pay | Admitting: Nurse Practitioner

## 2021-02-08 ENCOUNTER — Ambulatory Visit (INDEPENDENT_AMBULATORY_CARE_PROVIDER_SITE_OTHER): Payer: Medicare PPO | Admitting: Nurse Practitioner

## 2021-02-08 VITALS — BP 120/74 | HR 90 | Temp 97.2°F | Ht 62.0 in | Wt 117.0 lb

## 2021-02-08 DIAGNOSIS — J441 Chronic obstructive pulmonary disease with (acute) exacerbation: Secondary | ICD-10-CM

## 2021-02-08 DIAGNOSIS — Z1231 Encounter for screening mammogram for malignant neoplasm of breast: Secondary | ICD-10-CM | POA: Diagnosis not present

## 2021-02-08 DIAGNOSIS — J018 Other acute sinusitis: Secondary | ICD-10-CM

## 2021-02-08 DIAGNOSIS — J309 Allergic rhinitis, unspecified: Secondary | ICD-10-CM

## 2021-02-08 DIAGNOSIS — R051 Acute cough: Secondary | ICD-10-CM

## 2021-02-08 LAB — POC COVID19 BINAXNOW: SARS Coronavirus 2 Ag: NEGATIVE

## 2021-02-08 MED ORDER — DOXYCYCLINE HYCLATE 100 MG PO TABS
100.0000 mg | ORAL_TABLET | Freq: Two times a day (BID) | ORAL | 0 refills | Status: DC
Start: 2021-02-08 — End: 2021-03-21

## 2021-02-08 NOTE — Patient Instructions (Addendum)
Take Doxycycline 100 mg twice daily for 7 days Use Flonase nasal spray daily Continue Singulair daily Rest and push fluids   Sinusitis, Adult Sinusitis is soreness and swelling (inflammation) of your sinuses. Sinuses are hollow spaces in the bones around your face. They are located: Around your eyes. In the middle of your forehead. Behind your nose. In your cheekbones. Your sinuses and nasal passages are lined with a fluid called mucus. Mucus drains out of your sinuses. Swelling can trap mucus in your sinuses. This lets germs (bacteria, virus, or fungus) grow, which leads to infection. Most of the time, this condition is caused by a virus. What are the causes? This condition is caused by: Allergies. Asthma. Germs. Things that block your nose or sinuses. Growths in the nose (nasal polyps). Chemicals or irritants in the air. Fungus (rare). What increases the risk? You are more likely to develop this condition if: You have a weak body defense system (immune system). You do a lot of swimming or diving. You use nasal sprays too much. You smoke. What are the signs or symptoms? The main symptoms of this condition are pain and a feeling of pressure around the sinuses. Other symptoms include: Stuffy nose (congestion). Runny nose (drainage). Swelling and warmth in the sinuses. Headache. Toothache. A cough that may get worse at night. Mucus that collects in the throat or the back of the nose (postnasal drip). Being unable to smell and taste. Being very tired (fatigue). A fever. Sore throat. Bad breath. How is this diagnosed? This condition is diagnosed based on: Your symptoms. Your medical history. A physical exam. Tests to find out if your condition is short-term (acute) or long-term (chronic). Your doctor may: Check your nose for growths (polyps). Check your sinuses using a tool that has a light (endoscope). Check for allergies or germs. Do imaging tests, such as an MRI or  CT scan. How is this treated? Treatment for this condition depends on the cause and whether it is short-term or long-term. If caused by a virus, your symptoms should go away on their own within 10 days. You may be given medicines to relieve symptoms. They include: Medicines that shrink swollen tissue in the nose. Medicines that treat allergies (antihistamines). A spray that treats swelling of the nostrils.  Rinses that help get rid of thick mucus in your nose (nasal saline washes). If caused by bacteria, your doctor may wait to see if you will get better without treatment. You may be given antibiotic medicine if you have: A very bad infection. A weak body defense system. If caused by growths in the nose, you may need to have surgery. Follow these instructions at home: Medicines Take, use, or apply over-the-counter and prescription medicines only as told by your doctor. These may include nasal sprays. If you were prescribed an antibiotic medicine, take it as told by your doctor. Do not stop taking the antibiotic even if you start to feel better. Hydrate and humidify  Drink enough water to keep your pee (urine) pale yellow. Use a cool mist humidifier to keep the humidity level in your home above 50%. Breathe in steam for 10-15 minutes, 3-4 times a day, or as told by your doctor. You can do this in the bathroom while a hot shower is running. Try not to spend time in cool or dry air. Rest Rest as much as you can. Sleep with your head raised (elevated). Make sure you get enough sleep each night. General instructions  Put a  warm, moist washcloth on your face 3-4 times a day, or as often as told by your doctor. This will help with discomfort. Wash your hands often with soap and water. If there is no soap and water, use hand sanitizer. Do not smoke. Avoid being around people who are smoking (secondhand smoke). Keep all follow-up visits as told by your doctor. This is important. Contact a  doctor if: You have a fever. Your symptoms get worse. Your symptoms do not get better within 10 days. Get help right away if: You have a very bad headache. You cannot stop throwing up (vomiting). You have very bad pain or swelling around your face or eyes. You have trouble seeing. You feel confused. Your neck is stiff. You have trouble breathing. Summary Sinusitis is swelling of your sinuses. Sinuses are hollow spaces in the bones around your face. This condition is caused by tissues in your nose that become inflamed or swollen. This traps germs. These can lead to infection. If you were prescribed an antibiotic medicine, take it as told by your doctor. Do not stop taking it even if you start to feel better. Keep all follow-up visits as told by your doctor. This is important. This information is not intended to replace advice given to you by your health care provider. Make sure you discuss any questions you have with your health care provider. Document Revised: 05/20/2017 Document Reviewed: 05/20/2017 Elsevier Patient Education  2022 [employer]. Chronic Obstructive Pulmonary Disease Exacerbation Chronic obstructive pulmonary disease (COPD) is a long-term (chronic) lung problem. In COPD, the flow of air from the lungs is limited. COPD exacerbations are times that breathing gets worse and you need more than your normal treatment. Without treatment, they can be life-threatening. If they happen often, your lungs can become more damaged. What are the causes? Having infections that affect your airways and lungs. Being exposed to: Smoke. Air pollution. Chemical fumes. Dust. Things that can cause an allergic reaction (allergens). Not taking your usual COPD medicines as told. Having medical problems already, such as heart failure or infections not involving the lungs. In many cases, the cause is not known. What increases the risk? Smoking. Being an older adult. Having frequent prior  COPD exacerbations. What are the signs or symptoms? Increased coughing. Increased mucus from your lungs. Increased wheezing. Increased shortness of breath. Fast breathing and finding it hard to breathe. Chest tightness. Less energy than usual. Sleep disruption from symptoms. Confusion. Increased sleepiness. Often, these symptoms happen or get worse even with the use of medicines. How is this treated? Treatment for this condition depends on how bad it is and the cause of the symptoms. You may need to stay in the hospital for treatment. Treatment may include: Taking medicines.  Using oxygen. Being treated with different ways to clear your airway, such as using a mask to deliver oxygen. Follow these instructions at home: Medicines Take over-the-counter and prescription medicines only as told by your doctor. Use all inhaled medicines the correct way. If you were prescribed an antibiotic or steroid medicine, take it as told by your doctor. Do not stop taking it even if you start to feel better. Lifestyle Do not smoke or use any products that contain nicotine or tobacco. If you need help quitting, ask your doctor. Eat healthy foods. Exercise regularly. Get enough sleep. Most adults need 7 or more hours per night. Avoid tobacco smoke and other things that can bother your lungs. Several times a day, wash your hands with soap and water for at least 20 seconds. If you cannot use soap and water, use hand sanitizer. This may help keep you from getting an infection. During flu season, avoid areas that are crowded with people. General instructions Drink enough fluid to keep your pee (urine) pale yellow. Do not do this if your doctor has  told you not to. Use a cool mist machine (vaporizer). If you use oxygen or a machine that turns medicine into a mist (nebulizer), continue to use it as told. Keep all follow-up visits. How is this prevented? Keep up with shots (vaccinations) as told by your doctor. Be sure to get a yearly flu (influenza) shot. If you smoke, quit smoking. Smoking makes the problem worse. Follow all instructions for rehabilitation. These are steps you can take to make your body work better. Work with your doctor to develop and follow an action plan. This tells you what steps to take when you experience certain symptoms. Contact a doctor if: Your COPD symptoms get worse than normal. Get help right away if: You are short of breath and it gets worse, even when you are resting. You have trouble talking. You have chest pain. You cough up blood. You have a fever. You keep vomiting. You feel weak or you pass out (faint). You feel confused. You are not able to sleep because of your symptoms. You have trouble doing daily activities. These symptoms may be an emergency. Get help right away. Call your local emergency services (911 in the [place] Do not wait to see if the symptoms will go away. Do not drive yourself to the hospital. Summary COPD exacerbations are times that breathing gets worse and you need more treatment than normal. COPD exacerbations can be very serious and may cause your lungs to become more damaged. Do not smoke. If you need help quitting, ask your doctor. Stay up to date on your shots. Get a flu shot every year. This information is not intended to replace advice given to you by your health care provider. Make sure you discuss any questions you have with your health care provider. Document Revised: 11/11/2019 Document Reviewed: 10/27/2019 Elsevier Patient Education  2022 [employer].

## 2021-02-08 NOTE — Progress Notes (Signed)
Acute Office Visit  Subjective:    Patient ID: Allison Jimenez, female    DOB: 05/10/1950, 71 y.o.   MRN: [id number]  Chief Complaint  Patient presents with   URI    HPI: Patient is in today for Upper respiratory symptoms She complains of sinus congestion, pressure, post-nasal-drip, sore throat, and cough. Onset of symptoms was a few days ago and staying constant. Treatment has included Flonase nasal spray, Singulair, and inhalers. Past history is significant for COPD with current cigarette smoking and vaping.  Past Medical History:  Diagnosis Date   Acute blood loss anemia (ABLA) 09/05/2020   Allergy    ANXIETY 06/05/2006   Arthritis    SHOULDERS    BACK PAIN 01/05/2010   Cataract    bilateral, left worse   Centrilobular emphysema (HCC)    Centrilobular emphysema (HCC)    COPD (chronic obstructive pulmonary disease) (HCC)    DIVERTICULOSIS, COLON 06/05/2006   DIZZINESS OR VERTIGO 06/05/2006   positional   Hearing loss in left ear    HYPERLIPIDEMIA 06/05/2006   HYPOKALEMIA 12/14/2008   MENIERE'S DISEASE 06/25/2006   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   Qualifier: Diagnosis of  By: Amador Cunas  MD, Janett Labella    Migraine    TOBACCO USER 11/25/2007    Past Surgical History:  Procedure Laterality Date   APPENDECTOMY     CESAREAN SECTION     x1   CHOLECYSTECTOMY     COLONOSCOPY     COSMETIC SURGERY     mastoid surgery  1992   shunt in mastoid- endolymphatic sac in left ear    TUBAL LIGATION     vocal cord surgery     nodule x2    Family History  Problem Relation Age of Onset   Breast cancer Mother    Migraines Other    Depression Other    Anxiety disorder Other    High Cholesterol Other    Colon cancer Neg Hx    Rectal cancer Neg Hx    Stomach cancer Neg Hx    Colon polyps Neg Hx    Esophageal cancer Neg Hx     Social History   Socioeconomic History   Marital status: Divorced    Spouse name: Not on file   Number of children: 1   Years of education: 13   Highest  education level: Not on file  Occupational History    Employer: [employer]    Comment: Retired  Tobacco Use   Smoking status: Every Day    Packs/day: 1.50    Years: 49.00    Pack years: 73.50    Types: Cigarettes   Smokeless tobacco: Never   Tobacco comments:    Currently 1/2 ppd, uses vape   Vaping Use   Vaping Use: Some days   Start date: 01/01/2010   Substances: Nicotine   Devices: menthol/fruit flavor  Substance and Sexual Activity   Alcohol use: No    Alcohol/week: 0.0 standard drinks   Drug use: No   Sexual activity: Not on file  Other Topics Concern   Not on file  Social History Narrative   Patient lives at home alone. Patient has a Biochemist, clinical. Patient is divorced .   Patient is retired.   Education high school.   Right handed.   Caffeine none.   Social Determinants of Health   Financial Resource Strain: Not on file  Food Insecurity: Not on file  Transportation Needs: Not on file  Physical Activity: Not on file  Stress: Not on file  Social Connections: Not on file  Intimate Partner Violence: Not on file    Outpatient Medications Prior to Visit  Medication Sig Dispense Refill   Fluticasone-Umeclidin-Vilant (TRELEGY ELLIPTA) 100-62.5-25 MCG/ACT AEPB Inhale 1 puff into the lungs daily.     albuterol (VENTOLIN HFA) 108 (90 Base) MCG/ACT inhaler Inhale into the lungs.     ALPRAZolam (XANAX) 0.25 MG tablet TAKE 1 TABLET BY MOUTH THREE TIMES DAILY 90 tablet 2   amLODipine (NORVASC) 2.5 MG tablet Take 1 tablet (2.5 mg total) by mouth daily. 90 tablet 0   atorvastatin (LIPITOR) 20 MG tablet TAKE 1 TABLET(20 MG) BY MOUTH DAILY 90 tablet 1   chlorthalidone (HYGROTON) 25 MG tablet TAKE 1 TABLET(25 MG) BY MOUTH EVERY DAY 90 tablet 4   cyclobenzaprine (FLEXERIL) 10 MG tablet Take by mouth.     diclofenac Sodium (VOLTAREN) 1 % GEL diclofenac 1 % topical gel 100 g 0   Erenumab-aooe (AIMOVIG) 140 MG/ML SOAJ Every month     estradiol (ESTRACE) 0.1  MG/GM vaginal cream Place 1 Applicatorful vaginally at bedtime. Daily for 14 days then twice weekly 42.5 g 0   FLUoxetine (PROZAC) 20 MG capsule Take 2 capsules (40 mg total) by mouth daily. 180 capsule 1   fluticasone (FLONASE) 50 MCG/ACT nasal spray Place 2 sprays into both nostrils daily. 16 g 6   meloxicam (MOBIC) 7.5 MG tablet Take 7.5 mg by mouth 2 (two) times daily.     montelukast (SINGULAIR) 10 MG tablet TAKE 1 TABLET(10 MG) BY MOUTH AT BEDTIME 90 tablet 2   nystatin (MYCOSTATIN) 100000 UNIT/ML suspension Take 5 mLs (500,000 Units total) by mouth 4 (four) times daily. Swish, gargle and spit x 1 week 473 mL 0   nystatin-triamcinolone (MYCOLOG II) cream Apply twice a day prn 60 g 0   ondansetron (ZOFRAN) 4 MG tablet TAKE 1 TABLET(4 MG) BY MOUTH EVERY 8 HOURS AS NEEDED FOR NAUSEA OR VOMITING 90 tablet 0   pantoprazole (PROTONIX) 40 MG tablet Take 1 tablet (40 mg total) by mouth daily. 90 tablet 0   potassium chloride (MICRO-K) 10 MEQ CR capsule Take 2 capsules (20 mEq total) by mouth daily. 180 capsule 0   promethazine (PHENERGAN) 25 MG tablet TAKE 1 TABLET BY MOUTH EVERY 8 HOURS IF NEEDED 20 tablet 0   rizatriptan (MAXALT) 10 MG tablet Take 1 tablet (10 mg total) by mouth as needed for migraine. May repeat in 2 hours if needed 10 tablet 0   traZODone (DESYREL) 50 MG tablet Take 1 tablet (50 mg total) by mouth at bedtime as needed for sleep. 30 tablet 3   No facility-administered medications prior to visit.    Allergies  Allergen Reactions   Erythromycin Base Diarrhea   Penicillins Rash    Review of Systems  Constitutional:  Negative for chills, fatigue and fever.  HENT:  Positive for postnasal drip, sinus pressure, sinus pain and sore throat. Negative for congestion, ear pain and rhinorrhea.   Respiratory:  Positive for cough. Negative for shortness of breath.   Cardiovascular:  Negative for chest pain.  Gastrointestinal:  Negative for diarrhea and nausea.  Neurological:  Positive  for headaches. Negative for dizziness.      Objective:    Physical Exam Vitals reviewed.  Constitutional:      Appearance: Normal appearance.  HENT:     Head: Normocephalic.     Right Ear: Tympanic membrane normal. Tenderness present.  Left Ear: Tympanic membrane normal. Tenderness present.     Nose: Congestion and rhinorrhea present.     Mouth/Throat:     Mouth: Mucous membranes are dry.     Pharynx: Posterior oropharyngeal erythema present.  Cardiovascular:     Rate and Rhythm: Normal rate and regular rhythm.     Pulses: Normal pulses.     Heart sounds: Normal heart sounds.  Pulmonary:     Comments: Lung sounds diminished to bilateral posterior upper lobes Abdominal:     General: Bowel sounds are normal.     Palpations: Abdomen is soft.  Musculoskeletal:     Cervical back: Neck supple.  Skin:    General: Skin is warm and dry.     Capillary Refill: Capillary refill takes less than 2 seconds.  Neurological:     General: No focal deficit present.     Mental Status: She is alert and oriented to person, place, and time.  Psychiatric:        Mood and Affect: Mood normal.        Behavior: Behavior normal.    Pulse 90    Temp (!) 97.2 F (36.2 C)    Ht $R'5\' 2"'EY$  (1.575 m)    Wt 117 lb (53.1 kg)    SpO2 97%    BMI 21.40 kg/m  BP 120/74    Pulse 90    Temp (!) 97.2 F (36.2 C)    Ht $R'5\' 2"'nl$  (1.575 m)    Wt 117 lb (53.1 kg)    SpO2 97%    BMI 21.40 kg/m   Wt Readings from Last 3 Encounters:  02/08/21 117 lb (53.1 kg)  01/26/21 122 lb (55.3 kg)  01/19/21 121 lb (54.9 kg)    Health Maintenance Due  Topic Date Due   Zoster Vaccines- Shingrix (2 of 2) 06/22/2016   TETANUS/TDAP  05/17/2020       Lab Results  Component Value Date   TSH 3.370 01/19/2021   Lab Results  Component Value Date   WBC 8.6 01/19/2021   HGB 13.3 01/19/2021   HCT 39.7 01/19/2021   MCV 91 01/19/2021   PLT 344 01/19/2021   Lab Results  Component Value Date   NA 132 (L) 01/19/2021   K 3.8  01/19/2021   CO2 27 01/19/2021   GLUCOSE 104 (H) 01/19/2021   BUN 10 01/19/2021   CREATININE 0.63 01/19/2021   BILITOT 0.5 01/19/2021   ALKPHOS 60 01/19/2021   AST 20 01/19/2021   ALT 15 01/19/2021   PROT 6.9 01/19/2021   ALBUMIN 4.8 01/19/2021   CALCIUM 10.2 01/19/2021   EGFR 96 01/19/2021   GFR 75.48 07/09/2016   Lab Results  Component Value Date   CHOL 144 01/19/2021   Lab Results  Component Value Date   HDL 52 01/19/2021   Lab Results  Component Value Date   LDLCALC 67 01/19/2021   Lab Results  Component Value Date   TRIG 148 01/19/2021   Lab Results  Component Value Date   CHOLHDL 2.8 01/19/2021   Lab Results  Component Value Date   HGBA1C 5.7 (H) 01/19/2021       Assessment & Plan:    1. COPD with exacerbation ([place]) - Fluticasone-Umeclidin-Vilant (TRELEGY ELLIPTA) 100-62.5-25 MCG/ACT AEPB; Inhale 1 puff into the lungs daily. - doxycycline (VIBRA-TABS) 100 MG tablet; Take 1 tablet (100 mg total) by mouth 2 (two) times daily.  Dispense: 14 tablet; Refill: 0  2. Other acute sinusitis, recurrence not specified -  doxycycline (VIBRA-TABS) 100 MG tablet; Take 1 tablet (100 mg total) by mouth 2 (two) times daily.  Dispense: 14 tablet; Refill: 0  3. Chronic allergic rhinitis - Ambulatory referral to Allergy  4. Acute cough - POC COVID-19 BinaxNow-NEGATIVE  5. Screening mammogram for breast cancer - MM DIGITAL SCREENING BILATERAL; Future      Take Doxycycline 100 mg twice daily for 7 days Use Flonase nasal spray daily Continue Singulair daily Rest and push fluids  Follow-up: PRN  An After Visit Summary was printed and given to the patient.  I, Rip Harbour, NP, have reviewed all documentation for this visit. The documentation on 02/08/21 for the exam, diagnosis, procedures, and orders are all accurate and complete.    Signed, Rip Harbour, NP [place] [phone]

## 2021-02-14 ENCOUNTER — Telehealth: Payer: Self-pay

## 2021-02-14 NOTE — Telephone Encounter (Signed)
She saw Dr Gaylyn Cheers about 2 years ago. She is going to reach out to them. If she needs another referral she will reach back out to [place].   [name], [place] 02/14/21 1:28 PM

## 2021-02-14 NOTE — Telephone Encounter (Signed)
Patient aware.   Allison Jimenez, [place] 02/14/21 10:54 AM

## 2021-02-14 NOTE — Telephone Encounter (Signed)
Patient continuing to have drainage, congestion. ST, and productive cough. She was feeling some better until this weekend with the weather. She has been taking flonase, zyrtec, doxycycline. She has two doses left of doxycycline and feels this will not knock it out. She is requesting further medication.   Pharmacy: [facility]  [name], [place] 02/14/21 8:25 AM

## 2021-02-16 ENCOUNTER — Other Ambulatory Visit: Payer: Self-pay | Admitting: Family Medicine

## 2021-02-16 NOTE — Telephone Encounter (Signed)
Refill sent to pharmacy.   

## 2021-02-20 DIAGNOSIS — H6122 Impacted cerumen, left ear: Secondary | ICD-10-CM | POA: Diagnosis not present

## 2021-02-20 DIAGNOSIS — H9190 Unspecified hearing loss, unspecified ear: Secondary | ICD-10-CM | POA: Diagnosis not present

## 2021-02-20 DIAGNOSIS — Z8669 Personal history of other diseases of the nervous system and sense organs: Secondary | ICD-10-CM | POA: Diagnosis not present

## 2021-02-20 DIAGNOSIS — H9319 Tinnitus, unspecified ear: Secondary | ICD-10-CM | POA: Diagnosis not present

## 2021-02-20 DIAGNOSIS — H6121 Impacted cerumen, right ear: Secondary | ICD-10-CM | POA: Diagnosis not present

## 2021-02-20 DIAGNOSIS — H61303 Acquired stenosis of external ear canal, unspecified, bilateral: Secondary | ICD-10-CM | POA: Diagnosis not present

## 2021-02-20 DIAGNOSIS — J310 Chronic rhinitis: Secondary | ICD-10-CM | POA: Diagnosis not present

## 2021-02-20 DIAGNOSIS — J3489 Other specified disorders of nose and nasal sinuses: Secondary | ICD-10-CM | POA: Diagnosis not present

## 2021-02-20 DIAGNOSIS — J342 Deviated nasal septum: Secondary | ICD-10-CM | POA: Diagnosis not present

## 2021-02-24 ENCOUNTER — Other Ambulatory Visit: Payer: Self-pay | Admitting: Family Medicine

## 2021-02-24 NOTE — Telephone Encounter (Signed)
Refill sent to pharmacy.   

## 2021-02-27 DIAGNOSIS — M5489 Other dorsalgia: Secondary | ICD-10-CM | POA: Diagnosis not present

## 2021-02-27 DIAGNOSIS — M542 Cervicalgia: Secondary | ICD-10-CM | POA: Diagnosis not present

## 2021-02-28 ENCOUNTER — Telehealth: Payer: Self-pay | Admitting: Student

## 2021-02-28 NOTE — Telephone Encounter (Signed)
Spoke to patient.  She is requesting update on inhaler support program. She is wanting to know if Dr. Verlee Monte received form from [facility]?  Dr. Verlee Monte, please advise. Thanks

## 2021-03-01 ENCOUNTER — Ambulatory Visit
Admission: RE | Admit: 2021-03-01 | Discharge: 2021-03-01 | Disposition: A | Discharge status: Home / Self Care | Payer: Medicare PPO | Source: Ambulatory Visit | Attending: Nurse Practitioner | Admitting: Nurse Practitioner

## 2021-03-01 ENCOUNTER — Telehealth: Payer: Self-pay

## 2021-03-01 ENCOUNTER — Other Ambulatory Visit: Payer: Self-pay

## 2021-03-01 DIAGNOSIS — Z1231 Encounter for screening mammogram for malignant neoplasm of breast: Secondary | ICD-10-CM | POA: Diagnosis not present

## 2021-03-01 MED ORDER — MELOXICAM 7.5 MG PO TABS: 7.5000 mg | ORAL_TABLET | Freq: Two times a day (BID) | ORAL | 1 refills | Status: DC

## 2021-03-01 NOTE — Telephone Encounter (Signed)
Refill sent to pharmacy.   

## 2021-03-02 DIAGNOSIS — H61303 Acquired stenosis of external ear canal, unspecified, bilateral: Secondary | ICD-10-CM | POA: Diagnosis not present

## 2021-03-02 DIAGNOSIS — H6122 Impacted cerumen, left ear: Secondary | ICD-10-CM | POA: Diagnosis not present

## 2021-03-02 DIAGNOSIS — H9041 Sensorineural hearing loss, unilateral, right ear, with unrestricted hearing on the contralateral side: Secondary | ICD-10-CM | POA: Diagnosis not present

## 2021-03-03 ENCOUNTER — Other Ambulatory Visit: Payer: Self-pay

## 2021-03-03 DIAGNOSIS — Z122 Encounter for screening for malignant neoplasm of respiratory organs: Secondary | ICD-10-CM

## 2021-03-03 MED ORDER — FLUOXETINE HCL 20 MG PO CAPS: 40.0000 mg | ORAL_CAPSULE | Freq: Every day | ORAL | 1 refills | Status: DC

## 2021-03-07 ENCOUNTER — Ambulatory Visit (INDEPENDENT_AMBULATORY_CARE_PROVIDER_SITE_OTHER): Payer: Medicare PPO

## 2021-03-07 ENCOUNTER — Other Ambulatory Visit: Payer: Self-pay

## 2021-03-07 DIAGNOSIS — J449 Chronic obstructive pulmonary disease, unspecified: Secondary | ICD-10-CM

## 2021-03-07 DIAGNOSIS — I10 Essential (primary) hypertension: Secondary | ICD-10-CM

## 2021-03-07 DIAGNOSIS — F17200 Nicotine dependence, unspecified, uncomplicated: Secondary | ICD-10-CM

## 2021-03-07 NOTE — Patient Instructions (Signed)
Visit Information ? ?Thank you for taking time to visit with me today. Please don't hesitate to contact me if I can be of assistance to you before our next scheduled telephone appointment. ? ?Following are the goals we discussed today:  ?Take medications as prescribed   ?Attend all scheduled provider appointments ?Call provider office for new concerns or questions  ?limit outdoor activity during cold weather ?listen for public air quality announcements every day ?follow rescue plan if symptoms flare-up ?keep follow-up appointments: PCP and this care manager ?Continue to try to cut down on smoking ?keep a blood pressure log ?take blood pressure log to all doctor appointments ?call doctor for signs and symptoms of high blood pressure ?Monitor blood pressure per MD orders  ? ?Our next appointment is by telephone on 04/25/2021 at 1:30pm ? ?Please call the care guide team at [phone] if you need to cancel or reschedule your appointment.  ? ?If you are experiencing a Mental Health or [place] or need someone to talk to, please call the Suicide and Crisis Lifeline: 988 ?call the [place] National Suicide Prevention Lifeline: 503-877-7992 or TTY: 412-182-4184 TTY 873-886-2754) to talk to a trained counselor ?call 1-800-273-TALK (toll free, 24 hour hotline) ?call 911  ? ?The patient verbalized understanding of instructions, educational materials, and care plan provided today and agreed to receive a mailed copy of patient instructions, educational materials, and care plan.  ? ?Tomasa Rand RN, BSN, CEN ?RN Case Manager - [facility] ?[place] ?Mobile: [phone]  ?

## 2021-03-07 NOTE — Chronic Care Management (AMB) (Signed)
Chronic Care Management   CCM RN Visit Note  03/07/2021 Name: Allison Jimenez MRN: [id number] DOB: July 03, 1950  Subjective: Allison Jimenez is a 71 y.o. year old female who is a primary care patient of Cox, Kirsten, MD. The care management team was consulted for assistance with disease management and care coordination needs.    Engaged with patient by telephone for follow up visit in response to provider referral for case management and/or care coordination services.   Consent to Services:  The patient was given information about Chronic Care Management services, agreed to services, and gave verbal consent prior to initiation of services.  Please see initial visit note for detailed documentation.   Patient agreed to services and verbal consent obtained.   Assessment: Review of patient past medical history, allergies, medications, health status, including review of consultants reports, laboratory and other test data, was performed as part of comprehensive evaluation and provision of chronic care management services.   SDOH (Social Determinants of Health) assessments and interventions performed:    CCM Care Plan  Allergies  Allergen Reactions   Erythromycin Base Diarrhea   Penicillins Rash    Outpatient Encounter Medications as of 03/07/2021  Medication Sig   albuterol (VENTOLIN HFA) 108 (90 Base) MCG/ACT inhaler Inhale into the lungs.   ALPRAZolam (XANAX) 0.25 MG tablet TAKE 1 TABLET BY MOUTH THREE TIMES DAILY   amLODipine (NORVASC) 2.5 MG tablet Take 1 tablet (2.5 mg total) by mouth daily.   atorvastatin (LIPITOR) 20 MG tablet TAKE 1 TABLET(20 MG) BY MOUTH DAILY   chlorthalidone (HYGROTON) 25 MG tablet TAKE 1 TABLET(25 MG) BY MOUTH EVERY DAY   cyclobenzaprine (FLEXERIL) 10 MG tablet Take by mouth.   diclofenac Sodium (VOLTAREN) 1 % GEL diclofenac 1 % topical gel   doxycycline (VIBRA-TABS) 100 MG tablet Take 1 tablet (100 mg total) by mouth 2 (two) times daily.   Erenumab-aooe  (AIMOVIG) 140 MG/ML SOAJ Every month   estradiol (ESTRACE) 0.1 MG/GM vaginal cream Place 1 Applicatorful vaginally at bedtime. Daily for 14 days then twice weekly   FLUoxetine (PROZAC) 20 MG capsule Take 2 capsules (40 mg total) by mouth daily.   fluticasone (FLONASE) 50 MCG/ACT nasal spray Place 2 sprays into both nostrils daily.   Fluticasone-Umeclidin-Vilant (TRELEGY ELLIPTA) 100-62.5-25 MCG/ACT AEPB Inhale 1 puff into the lungs daily.   meloxicam (MOBIC) 7.5 MG tablet Take 1 tablet (7.5 mg total) by mouth 2 (two) times daily.   montelukast (SINGULAIR) 10 MG tablet TAKE 1 TABLET(10 MG) BY MOUTH AT BEDTIME   nystatin (MYCOSTATIN) 100000 UNIT/ML suspension Take 5 mLs (500,000 Units total) by mouth 4 (four) times daily. Swish, gargle and spit x 1 week   nystatin-triamcinolone (MYCOLOG II) cream Apply twice a day prn   ondansetron (ZOFRAN) 4 MG tablet TAKE 1 TABLET(4 MG) BY MOUTH EVERY 8 HOURS AS NEEDED FOR NAUSEA OR VOMITING   pantoprazole (PROTONIX) 40 MG tablet TAKE 1 TABLET(40 MG) BY MOUTH DAILY   potassium chloride (MICRO-K) 10 MEQ CR capsule TAKE 2 CAPSULES(20 MEQ) BY MOUTH DAILY   promethazine (PHENERGAN) 25 MG tablet TAKE 1 TABLET BY MOUTH EVERY 8 HOURS IF NEEDED   rizatriptan (MAXALT) 10 MG tablet Take 1 tablet (10 mg total) by mouth as needed for migraine. May repeat in 2 hours if needed   traZODone (DESYREL) 50 MG tablet Take 1 tablet (50 mg total) by mouth at bedtime as needed for sleep.   No facility-administered encounter medications on file as of 03/07/2021.  Patient Active Problem List   Diagnosis Date Noted   Chronic vaginitis 01/21/2021   Acute cystitis without hematuria 01/21/2021   COPD (chronic obstructive pulmonary disease) ([place]) 01/19/2021   Aneurysm of infrarenal abdominal aorta 09/05/2020   Hypothyroidism (acquired) 07/10/2020   Prediabetes 07/10/2020   Ocular migraine with status migrainosus 07/10/2020   Essential hypertension 06/15/2019   Major depressive  disorder, recurrent episode, mild ([place]) 09/10/2018   Mixed conductive and sensorineural hearing loss of left ear with restricted hearing of right ear 01/24/2017   Meniere's disease in remission, bilateral 01/24/2017   TOBACCO USER 11/25/2007   Dyslipidemia 06/05/2006   DIVERTICULOSIS, COLON 06/05/2006    Conditions to be addressed/monitored:HTN, COPD, and smoking and no advanced directives       Care Plan : RN Care Manager Plan of Care  Updates made by Thana Ates, RN since 03/07/2021 12:00 AM     Problem: No plan of care established for chronic disease states ( COPD, smoking, no advanced directives, hypertension)   Priority: High     Long-Range Goal: Development of plan of care for the management of chronic disease states ( COPD, Smoking, no advanced directives, hypertension)   Start Date: 03/07/2021  Expected End Date: 03/08/2022  Priority: High  Note:   Current Barriers:  Chronic Disease Management support and education needs related to HTN, COPD, and smoking, no advanced directives  03/07/2021 COPD-  Patient reports that she feels like her COPD is getting better. Reports that she continues to use her medications as prescribed. Reports she continues to smoke 1/2 pack per day/ Describes difficulty in smoking cessation.    Reports she is having sinus issues and saw ENT who stated it was viral. Reports she was told that her Left ear is dead and she has moderate hearing loss in the right ear.  Advanced Directives- Patient reports that she has the packet that was mailed to her but has not reviewed the packet.   RNCM Clinical Goal(s):  Patient will demonstrate ongoing adherence to prescribed treatment plan for HTN, COPD, and no advanced directives, smoking as evidenced by patient verbal report and reviewed of EMR  through collaboration with RN Care manager, provider, and care team.   Interventions: 1:1 collaboration with primary care provider regarding development and update of  comprehensive plan of care as evidenced by provider attestation and co-signature Inter-disciplinary care team collaboration (see longitudinal plan of care) Evaluation of current treatment plan related to  self management and patient's adherence to plan as established by provider   COPD: (Status: New goal.) Long Term Goal  Reviewed medications with patient, including use of prescribed maintenance and rescue inhalers, and provided instruction on medication management and the importance of adherence Advised patient to track and manage COPD triggers Provided instruction about proper use of medications used for management of COPD including inhalers Advised patient to self assesses COPD action plan zone and make appointment with provider if in the yellow zone for 48 hours without improvement Discussed the importance of adequate rest and management of fatigue with COPD  No advanced directives  (Status: New goal.) Long Term Goal  Evaluation of current treatment plan related to  no advanced directives , self-management and patient's adherence to plan as established by provider. Discussed plans with patient for ongoing care management follow up and provided patient with direct contact information for care management team Provided education to patient and/or caregiver about advanced directives; Encouraged patient to complete advanced directives  Hypertension: (Status: New goal.) Long  Term Goal  Last practice recorded BP readings:  BP Readings from Last 3 Encounters:  02/08/21 120/74  01/26/21 138/72  01/19/21 130/70  Most recent eGFR/CrCl:  Lab Results  Component Value Date   EGFR 96 01/19/2021    No components found for: CRCL  Evaluation of current treatment plan related to hypertension self management and patient's adherence to plan as established by provider;   Reviewed medications with patient and discussed importance of compliance;  Discussed plans with patient for ongoing care management  follow up and provided patient with direct contact information for care management team; Advised patient, providing education and rationale, to monitor blood pressure daily and record, calling PCP for findings outside established parameters;  Reviewed scheduled/upcoming provider appointments including:   Patient Goals/Self-Care Activities: Take medications as prescribed   Attend all scheduled provider appointments Call provider office for new concerns or questions  limit outdoor activity during cold weather listen for public air quality announcements every day follow rescue plan if symptoms flare-up keep follow-up appointments: PCP and this care manager Continue to try to cut down on smoking keep a blood pressure log take blood pressure log to all doctor appointments call doctor for signs and symptoms of high blood pressure Monitor blood pressure per MD orders      Plan:Telephone follow up appointment with care management team member scheduled for:  04/25/2021  at 1:30 pm Tomasa Rand RN, BSN, CEN RN Case Manager - [facility] [employer] exhibitions officer Mobile: [phone]

## 2021-03-13 DIAGNOSIS — M5489 Other dorsalgia: Secondary | ICD-10-CM | POA: Diagnosis not present

## 2021-03-13 DIAGNOSIS — M542 Cervicalgia: Secondary | ICD-10-CM | POA: Diagnosis not present

## 2021-03-20 NOTE — Progress Notes (Addendum)
? ?Synopsis: Referred for COPD exacerbation by Rochel Brome, MD ? ?Subjective:  ? ?PATIENT ID: Allison Jimenez GENDER: female DOB: 1950/01/10, MRN: [id number] ? ?Chief Complaint  ?Patient presents with  ? Follow-up  ?  PFT's done today. Breathing has slightly improved "can breathe a little bit deeper". She has never had to use her albuterol inhaler.   ? ?71yF with history of COPD/emphysema, 22 py smoking including ecigs, meniere's, referred by Jerrell Belfast following AECOPD 11/01/20. ? ?She has been prescribed trelegy - but she is not using it. Doesn't feel like she needs it.  ? ?Some DOE when she goes up long incline. She doesn't necessarily think it's worse than other patients her age without lung disease. She does have cough but not especially bothersome.  ? ?She says she thinks she has allergic rhinitis  ? ?She is currently enrolled in our lung cancer screening program. Vapes with some sort of ecig but unsure what is in liquid - she doesn't think it contains nicotine.  ? ?Interval HPI ?Last seen 01/26/21 at which point planned on PFT prior to consideration of long term controller inhaler (pt declined inhaler at last visit). She has been on trelegy though since not too long after last visit. Rinsing mouth/gargling after use. Some intermittent sore throat but she doesn't think it's thrush. ? ?Did have sinus infection in Feb needing doxycycline.  ? ?No courses of steroids since last visit. ? ?Otherwise pertinent review of systems is negative. ? ?Past Medical History:  ?Diagnosis Date  ? Acute blood loss anemia ([facility]) 09/05/2020  ? Allergy   ? ANXIETY 06/05/2006  ? Arthritis   ? SHOULDERS   ? BACK PAIN 01/05/2010  ? Cataract   ? bilateral, left worse  ? Centrilobular emphysema ([place])   ? Centrilobular emphysema ([place])   ? COPD (chronic obstructive pulmonary disease) ([place])   ? DIVERTICULOSIS, COLON 06/05/2006  ? DIZZINESS OR VERTIGO 06/05/2006  ? positional  ? Hearing loss in left ear   ? HYPERLIPIDEMIA 06/05/2006  ? HYPOKALEMIA  12/14/2008  ? MENIERE'S DISEASE 06/25/2006  ? 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 Qualifier: Diagnosis of  By: Burnice Logan  MD, Doretha Sou   ? Migraine   ? TOBACCO USER 11/25/2007  ?  ? ?Family History  ?Problem Relation Age of Onset  ? Breast cancer Mother   ? Migraines Other   ? Depression Other   ? Anxiety disorder Other   ? High Cholesterol Other   ? Colon cancer Neg Hx   ? Rectal cancer Neg Hx   ? Stomach cancer Neg Hx   ? Colon polyps Neg Hx   ? Esophageal cancer Neg Hx   ?  ? ?Past Surgical History:  ?Procedure Laterality Date  ? APPENDECTOMY    ? CESAREAN SECTION    ? x1  ? CHOLECYSTECTOMY    ? COLONOSCOPY    ? COSMETIC SURGERY    ? mastoid surgery  1992  ? shunt in mastoid- endolymphatic sac in left ear   ? TUBAL LIGATION    ? vocal cord surgery    ? nodule x2  ? ? ?Social History  ? ?Socioeconomic History  ? Marital status: Divorced  ?  Spouse name: Not on file  ? Number of children: 1  ? Years of education: 66  ? Highest education level: Not on file  ?Occupational History  ?  Employer: [employer]  ?  Comment: Retired  ?Tobacco Use  ? Smoking status: Every Day  ?  Packs/day: 1.50  ?  Years: 49.00  ?  Pack years: 73.50  ?  Types: Cigarettes  ? Smokeless tobacco: Never  ? Tobacco comments:  ?  Currently 1/2 ppd, uses vape   ?Vaping Use  ? Vaping Use: Some days  ? Start date: 01/01/2010  ? Substances: Nicotine  ? Devices: menthol/fruit flavor  ?Substance and Sexual Activity  ? Alcohol use: No  ?  Alcohol/week: 0.0 standard drinks  ? Drug use: No  ? Sexual activity: Not on file  ?Other Topics Concern  ? Not on file  ?Social History Narrative  ? Patient lives at home alone. Patient has a Data processing manager. Patient is divorced .  ? Patient is retired.  ? Education high school.  ? Right handed.  ? Caffeine none.  ? ?Social Determinants of Health  ? ?Financial Resource Strain: Not on file  ?Food Insecurity: Not on file  ?Transportation Needs: Not on file  ?Physical Activity: Not on file  ?Stress: Not on  file  ?Social Connections: Not on file  ?Intimate Partner Violence: Not on file  ?  ? ?Allergies  ?Allergen Reactions  ? Erythromycin Base Diarrhea  ? Penicillins Rash  ?  ? ?Outpatient Medications Prior to Visit  ?Medication Sig Dispense Refill  ? albuterol (VENTOLIN HFA) 108 (90 Base) MCG/ACT inhaler Inhale into the lungs.    ? ALPRAZolam (XANAX) 0.25 MG tablet TAKE 1 TABLET BY MOUTH THREE TIMES DAILY 90 tablet 2  ? amLODipine (NORVASC) 2.5 MG tablet Take 1 tablet (2.5 mg total) by mouth daily. 90 tablet 0  ? atorvastatin (LIPITOR) 20 MG tablet TAKE 1 TABLET(20 MG) BY MOUTH DAILY 90 tablet 1  ? chlorthalidone (HYGROTON) 25 MG tablet TAKE 1 TABLET(25 MG) BY MOUTH EVERY DAY 90 tablet 4  ? cyclobenzaprine (FLEXERIL) 10 MG tablet Take by mouth.    ? diclofenac Sodium (VOLTAREN) 1 % GEL diclofenac 1 % topical gel 100 g 0  ? Erenumab-aooe (AIMOVIG) 140 MG/ML SOAJ Every month    ? estradiol (ESTRACE) 0.1 MG/GM vaginal cream Place 1 Applicatorful vaginally at bedtime. Daily for 14 days then twice weekly 42.5 g 0  ? FLUoxetine (PROZAC) 20 MG capsule Take 2 capsules (40 mg total) by mouth daily. 180 capsule 1  ? fluticasone (FLONASE) 50 MCG/ACT nasal spray Place 2 sprays into both nostrils daily. 16 g 6  ? meloxicam (MOBIC) 7.5 MG tablet Take 1 tablet (7.5 mg total) by mouth 2 (two) times daily. 90 tablet 1  ? montelukast (SINGULAIR) 10 MG tablet TAKE 1 TABLET(10 MG) BY MOUTH AT BEDTIME 90 tablet 2  ? nystatin (MYCOSTATIN) 100000 UNIT/ML suspension Take 5 mLs (500,000 Units total) by mouth 4 (four) times daily. Swish, gargle and spit x 1 week 473 mL 0  ? nystatin-triamcinolone (MYCOLOG II) cream Apply twice a day prn 60 g 0  ? ondansetron (ZOFRAN) 4 MG tablet TAKE 1 TABLET(4 MG) BY MOUTH EVERY 8 HOURS AS NEEDED FOR NAUSEA OR VOMITING 90 tablet 0  ? pantoprazole (PROTONIX) 40 MG tablet TAKE 1 TABLET(40 MG) BY MOUTH DAILY 90 tablet 0  ? potassium chloride (MICRO-K) 10 MEQ CR capsule TAKE 2 CAPSULES(20 MEQ) BY MOUTH DAILY 180  capsule 0  ? promethazine (PHENERGAN) 25 MG tablet TAKE 1 TABLET BY MOUTH EVERY 8 HOURS IF NEEDED 20 tablet 0  ? rizatriptan (MAXALT) 10 MG tablet Take 1 tablet (10 mg total) by mouth as needed for migraine. May repeat in 2 hours if needed 10 tablet 0  ?  traZODone (DESYREL) 50 MG tablet Take 1 tablet (50 mg total) by mouth at bedtime as needed for sleep. 30 tablet 3  ? Fluticasone-Umeclidin-Vilant (TRELEGY ELLIPTA) 100-62.5-25 MCG/ACT AEPB Inhale 1 puff into the lungs daily.    ? doxycycline (VIBRA-TABS) 100 MG tablet Take 1 tablet (100 mg total) by mouth 2 (two) times daily. 14 tablet 0  ? ?No facility-administered medications prior to visit.  ? ? ? ? ? ?Objective:  ? ?Physical Exam: ? ?General appearance: 71 y.o., female, NAD, conversant  ?Eyes: anicteric sclerae; PERRL, tracking appropriately ?HENT: NCAT; erythema over posterior OP but no plaques  ?Neck: Trachea midline; no lymphadenopathy, no JVD ?Lungs: CTAB, no crackles, no wheeze, with normal respiratory effort ?CV: RRR, no murmur  ?Abdomen: Soft, non-tender; non-distended, BS present  ?Extremities: No peripheral edema, warm ?Skin: Normal turgor and texture; no rash ?Psych: Appropriate affect ?Neuro: Alert and oriented to person and place, no focal deficit  ? ? ? ?Vitals:  ? 03/21/21 1413  ?BP: 128/74  ?Pulse: 89  ?Temp: 98.1 ?F (36.7 ?C)  ?TempSrc: Oral  ?SpO2: 98%  ?Weight: 121 lb 3.2 oz (55 kg)  ?Height: 5' 0.5" (1.537 m)  ? ? ?98% on RA ?BMI Readings from Last 3 Encounters:  ?03/21/21 23.28 kg/m?  ?02/08/21 21.40 kg/m?  ?01/26/21 22.31 kg/m?  ? ?Wt Readings from Last 3 Encounters:  ?03/21/21 121 lb 3.2 oz (55 kg)  ?02/08/21 117 lb (53.1 kg)  ?01/26/21 122 lb (55.3 kg)  ? ? ? ?CBC ?   ?Component Value Date/Time  ? WBC 8.6 01/19/2021 1152  ? WBC 9.2 07/14/2020 1145  ? RBC 4.36 01/19/2021 1152  ? RBC 4.20 07/14/2020 1145  ? HGB 13.3 01/19/2021 1152  ? HCT 39.7 01/19/2021 1152  ? PLT 344 01/19/2021 1152  ? MCV 91 01/19/2021 1152  ? MCH 30.5 01/19/2021 1152  ?  MCH 28.6 07/14/2020 1145  ? MCHC 33.5 01/19/2021 1152  ? MCHC 33.6 07/14/2020 1145  ? RDW 11.9 01/19/2021 1152  ? LYMPHSABS 2.3 01/19/2021 1152  ? MONOABS 0.7 07/14/2020 1145  ? EOSABS 0.3 01/19/2021 1152

## 2021-03-21 ENCOUNTER — Other Ambulatory Visit: Payer: Self-pay

## 2021-03-21 ENCOUNTER — Encounter: Payer: Self-pay | Admitting: Student

## 2021-03-21 ENCOUNTER — Ambulatory Visit (INDEPENDENT_AMBULATORY_CARE_PROVIDER_SITE_OTHER): Payer: Medicare PPO | Admitting: Student

## 2021-03-21 ENCOUNTER — Ambulatory Visit: Payer: Medicare PPO | Admitting: Student

## 2021-03-21 VITALS — BP 128/74 | HR 89 | Temp 98.1°F | Ht 60.5 in | Wt 121.2 lb

## 2021-03-21 DIAGNOSIS — J432 Centrilobular emphysema: Secondary | ICD-10-CM

## 2021-03-21 DIAGNOSIS — J449 Chronic obstructive pulmonary disease, unspecified: Secondary | ICD-10-CM

## 2021-03-21 DIAGNOSIS — F17210 Nicotine dependence, cigarettes, uncomplicated: Secondary | ICD-10-CM

## 2021-03-21 DIAGNOSIS — R0982 Postnasal drip: Secondary | ICD-10-CM

## 2021-03-21 DIAGNOSIS — J441 Chronic obstructive pulmonary disease with (acute) exacerbation: Secondary | ICD-10-CM

## 2021-03-21 LAB — PULMONARY FUNCTION TEST
DL/VA % pred: 56 %
DL/VA: 2.39 ml/min/mmHg/L
DLCO cor % pred: 59 %
DLCO cor: 10.29 ml/min/mmHg
DLCO unc % pred: 59 %
DLCO unc: 10.29 ml/min/mmHg
FEF 25-75 Post: 0.92 L/sec
FEF 25-75 Pre: 0.79 L/sec
FEF2575-%Change-Post: 16 %
FEF2575-%Pred-Post: 52 %
FEF2575-%Pred-Pre: 45 %
FEV1-%Change-Post: 2 %
FEV1-%Pred-Post: 78 %
FEV1-%Pred-Pre: 76 %
FEV1-Post: 1.55 L
FEV1-Pre: 1.52 L
FEV1FVC-%Change-Post: -1 %
FEV1FVC-%Pred-Pre: 80 %
FEV6-%Change-Post: 2 %
FEV6-%Pred-Post: 99 %
FEV6-%Pred-Pre: 96 %
FEV6-Post: 2.47 L
FEV6-Pre: 2.4 L
FEV6FVC-%Change-Post: 0 %
FEV6FVC-%Pred-Post: 102 %
FEV6FVC-%Pred-Pre: 102 %
FVC-%Change-Post: 3 %
FVC-%Pred-Post: 97 %
FVC-%Pred-Pre: 94 %
FVC-Post: 2.56 L
FVC-Pre: 2.47 L
Post FEV1/FVC ratio: 60 %
Post FEV6/FVC ratio: 98 %
Pre FEV1/FVC ratio: 61 %
Pre FEV6/FVC Ratio: 98 %
RV % pred: 197 %
RV: 3.93 L
TLC % pred: 139 %
TLC: 6.32 L

## 2021-03-21 MED ORDER — TRELEGY ELLIPTA 100-62.5-25 MCG/ACT IN AEPB
1.0000 | INHALATION_SPRAY | Freq: Every day | RESPIRATORY_TRACT | 0 refills | Status: DC
Start: 2021-03-21 — End: 2021-04-05

## 2021-03-21 MED ORDER — TRELEGY ELLIPTA 100-62.5-25 MCG/ACT IN AEPB: 1.0000 | INHALATION_SPRAY | Freq: Every day | RESPIRATORY_TRACT | 11 refills | Status: DC

## 2021-03-21 NOTE — Patient Instructions (Addendum)
-   would continue trelegy 100 mcg 1 puff once daily, rinse mouth after use ?- albuterol 1-2 puffs as needed rescue inhaler ?- take shower clear nose of crusting then flonase 1 spray each nostril, continue zyrtec 10 mg daily for rhinitis, postnasal drainage ?- you'll be called to schedule your ct chest ?- see you in June!  ?

## 2021-03-21 NOTE — Progress Notes (Signed)
PFT done today. 

## 2021-03-31 DIAGNOSIS — I10 Essential (primary) hypertension: Secondary | ICD-10-CM | POA: Diagnosis not present

## 2021-03-31 DIAGNOSIS — J449 Chronic obstructive pulmonary disease, unspecified: Secondary | ICD-10-CM

## 2021-04-05 ENCOUNTER — Ambulatory Visit: Payer: Medicare PPO | Admitting: Allergy and Immunology

## 2021-04-05 ENCOUNTER — Encounter: Payer: Self-pay | Admitting: Allergy and Immunology

## 2021-04-05 VITALS — BP 118/72 | HR 84 | Resp 16 | Ht 60.0 in | Wt 121.8 lb

## 2021-04-05 DIAGNOSIS — K219 Gastro-esophageal reflux disease without esophagitis: Secondary | ICD-10-CM | POA: Diagnosis not present

## 2021-04-05 DIAGNOSIS — F17210 Nicotine dependence, cigarettes, uncomplicated: Secondary | ICD-10-CM | POA: Diagnosis not present

## 2021-04-05 DIAGNOSIS — J449 Chronic obstructive pulmonary disease, unspecified: Secondary | ICD-10-CM | POA: Diagnosis not present

## 2021-04-05 DIAGNOSIS — J301 Allergic rhinitis due to pollen: Secondary | ICD-10-CM

## 2021-04-05 DIAGNOSIS — J4489 Other specified chronic obstructive pulmonary disease: Secondary | ICD-10-CM

## 2021-04-05 DIAGNOSIS — J3089 Other allergic rhinitis: Secondary | ICD-10-CM

## 2021-04-05 MED ORDER — FAMOTIDINE 40 MG PO TABS: 40.0000 mg | ORAL_TABLET | Freq: Every day | ORAL | 5 refills | Status: DC

## 2021-04-05 NOTE — Progress Notes (Deleted)
? ? ?[place] ? ? ?NEW PATIENT NOTE ? ?Referring Provider: Rip Harbour, NP ?Primary Provider: Rochel Brome, MD ?Date of office visit: 04/05/2021 ?   ?Subjective:  ? ?Chief Complaint:  Allison Jimenez (DOB: 05/02/1950) is a 71 y.o. female who presents to the clinic on 04/05/2021 with a chief complaint of Allergies ?Marland Kitchen  ?HPI: ? ?Past Medical History:  ?Diagnosis Date  ? Acute blood loss anemia (ABLA) 09/05/2020  ? Allergy   ? ANXIETY 06/05/2006  ? Arthritis   ? SHOULDERS   ? BACK PAIN 01/05/2010  ? Cataract   ? bilateral, left worse  ? Centrilobular emphysema ([place])   ? Centrilobular emphysema ([place])   ? COPD (chronic obstructive pulmonary disease) ([place])   ? DIVERTICULOSIS, COLON 06/05/2006  ? DIZZINESS OR VERTIGO 06/05/2006  ? positional  ? Hearing loss in left ear   ? HYPERLIPIDEMIA 06/05/2006  ? HYPOKALEMIA 12/14/2008  ? MENIERE'S DISEASE 06/25/2006  ? 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 Qualifier: Diagnosis of  By: Burnice Logan  MD, Doretha Sou   ? Migraine   ? TOBACCO USER 11/25/2007  ? ? ?Past Surgical History:  ?Procedure Laterality Date  ? APPENDECTOMY    ? CESAREAN SECTION    ? x1  ? CHOLECYSTECTOMY    ? COLONOSCOPY    ? COSMETIC SURGERY    ? mastoid surgery  1992  ? shunt in mastoid- endolymphatic sac in left ear   ? TUBAL LIGATION    ? vocal cord surgery    ? nodule x2  ? ? ?Allergies as of 04/05/2021   ? ?   Reactions  ? Erythromycin Base Diarrhea  ? Penicillins Rash  ? ?  ? ?  ?Medication List  ?  ? ?  ? Accurate as of April 05, 2021  3:04 PM. If you have any questions, ask your nurse or doctor.  ?  ?  ? ?  ? ?Aimovig 140 MG/ML Soaj ?Generic drug: Erenumab-aooe ?Every month ?  ?albuterol 108 (90 Base) MCG/ACT inhaler ?Commonly known as: VENTOLIN HFA ?Inhale into the lungs. ?  ?ALPRAZolam 0.25 MG tablet ?Commonly known as: Duanne Moron ?TAKE 1 TABLET BY MOUTH THREE TIMES DAILY ?  ?amLODipine 2.5 MG tablet ?Commonly known as: NORVASC ?Take 1 tablet (2.5 mg total) by mouth daily. ?   ?atorvastatin 20 MG tablet ?Commonly known as: LIPITOR ?TAKE 1 TABLET(20 MG) BY MOUTH DAILY ?  ?chlorthalidone 25 MG tablet ?Commonly known as: HYGROTON ?TAKE 1 TABLET(25 MG) BY MOUTH EVERY DAY ?  ?cyclobenzaprine 10 MG tablet ?Commonly known as: FLEXERIL ?Take by mouth. ?  ?diclofenac Sodium 1 % Gel ?Commonly known as: VOLTAREN ?diclofenac 1 % topical gel ?  ?estradiol 0.1 MG/GM vaginal cream ?Commonly known as: ESTRACE ?Place 1 Applicatorful vaginally at bedtime. Daily for 14 days then twice weekly ?  ?FLUoxetine 20 MG capsule ?Commonly known as: PROZAC ?Take 2 capsules (40 mg total) by mouth daily. ?  ?fluticasone 50 MCG/ACT nasal spray ?Commonly known as: FLONASE ?Place 2 sprays into both nostrils daily. ?  ?meloxicam 7.5 MG tablet ?Commonly known as: MOBIC ?Take 1 tablet (7.5 mg total) by mouth 2 (two) times daily. ?  ?montelukast 10 MG tablet ?Commonly known as: SINGULAIR ?TAKE 1 TABLET(10 MG) BY MOUTH AT BEDTIME ?  ?nystatin 100000 UNIT/ML suspension ?Commonly known as: MYCOSTATIN ?Take 5 mLs (500,000 Units total) by mouth 4 (four) times daily. Swish, gargle and spit x 1 week ?  ?nystatin-triamcinolone cream ?  Commonly known as: MYCOLOG II ?Apply twice a day prn ?  ?ondansetron 4 MG tablet ?Commonly known as: ZOFRAN ?TAKE 1 TABLET(4 MG) BY MOUTH EVERY 8 HOURS AS NEEDED FOR NAUSEA OR VOMITING ?  ?pantoprazole 40 MG tablet ?Commonly known as: PROTONIX ?TAKE 1 TABLET(40 MG) BY MOUTH DAILY ?  ?potassium chloride 10 MEQ CR capsule ?Commonly known as: MICRO-K ?TAKE 2 CAPSULES(20 MEQ) BY MOUTH DAILY ?  ?promethazine 25 MG tablet ?Commonly known as: PHENERGAN ?TAKE 1 TABLET BY MOUTH EVERY 8 HOURS IF NEEDED ?  ?rizatriptan 10 MG tablet ?Commonly known as: Maxalt ?Take 1 tablet (10 mg total) by mouth as needed for migraine. May repeat in 2 hours if needed ?  ?traZODone 50 MG tablet ?Commonly known as: DESYREL ?Take 1 tablet (50 mg total) by mouth at bedtime as needed for sleep. ?  ?Trelegy Ellipta 100-62.5-25 MCG/ACT  Aepb ?Generic drug: Fluticasone-Umeclidin-Vilant ?Inhale 1 puff into the lungs daily. ?What changed: Another medication with the same name was removed. Continue taking this medication, and follow the directions you see here. ?Changed by: Jiles Prows, MD ?  ? ?  ? ? ?Review of systems negative except as noted in HPI / PMHx or noted below: ? ?Review of Systems  ?Constitutional: Negative.   ?HENT: Negative.    ?Eyes: Negative.   ?Respiratory: Negative.    ?Cardiovascular: Negative.   ?Gastrointestinal: Negative.   ?Genitourinary: Negative.   ?Musculoskeletal: Negative.   ?Skin: Negative.   ?Neurological: Negative.   ?Endo/Heme/Allergies: Negative.   ?Psychiatric/Behavioral: Negative.    ? ?Family History  ?Problem Relation Age of Onset  ? Breast cancer Mother   ? Migraines Other   ? Depression Other   ? Anxiety disorder Other   ? High Cholesterol Other   ? Colon cancer Neg Hx   ? Rectal cancer Neg Hx   ? Stomach cancer Neg Hx   ? Colon polyps Neg Hx   ? Esophageal cancer Neg Hx   ? ? ?Social History  ? ?Socioeconomic History  ? Marital status: Divorced  ?  Spouse name: Not on file  ? Number of children: 1  ? Years of education: 18  ? Highest education level: Not on file  ?Occupational History  ?  Employer: [employer]  ?  Comment: Retired  ?Tobacco Use  ? Smoking status: Every Day  ?  Packs/day: 1.50  ?  Years: 49.00  ?  Pack years: 73.50  ?  Types: Cigarettes  ? Smokeless tobacco: Never  ? Tobacco comments:  ?  Currently 1/2 ppd, uses vape   ?Vaping Use  ? Vaping Use: Some days  ? Start date: 01/01/2010  ? Substances: Nicotine  ? Devices: menthol/fruit flavor  ?Substance and Sexual Activity  ? Alcohol use: No  ?  Alcohol/week: 0.0 standard drinks  ? Drug use: No  ? Sexual activity: Not on file  ?Other Topics Concern  ? Not on file  ?Social History Narrative  ? Patient lives at home alone. Patient has a Data processing manager. Patient is divorced .  ? Patient is retired.  ? Education high school.  ? Right  handed.  ? Caffeine none.  ? ?Social Determinants of Health  ? ?Financial Resource Strain: Not on file  ?Food Insecurity: Not on file  ?Transportation Needs: Not on file  ?Physical Activity: Not on file  ?Stress: Not on file  ?Social Connections: Not on file  ?Intimate Partner Violence: Not on file  ? ? ?Environmental and Social  history ? ?Lives in a house with a dry environment, cats located inside the household, carpet in the bedroom, no plastic on the bed, no plastic on the pillow, actively smoking tobacco products. ?Objective:  ? ?Vitals:  ? 04/05/21 1413  ?BP: 118/72  ?Pulse: 84  ?Resp: 16  ?SpO2: 97%  ? ?Height: 5' (152.4 cm) ?Weight: 121 lb 12.8 oz (55.2 kg) ? ?Physical Exam ?Constitutional:   ?   Appearance: She is not diaphoretic.  ?   Comments: Slightly raspy voice  ?HENT:  ?   Head: Normocephalic.  ?   Right Ear: Tympanic membrane, ear canal and external ear normal.  ?   Left Ear: Tympanic membrane, ear canal and external ear normal.  ?   Nose: Nose normal. No mucosal edema or rhinorrhea.  ?   Mouth/Throat:  ?   Pharynx: Uvula midline. No oropharyngeal exudate.  ?Eyes:  ?   Conjunctiva/sclera: Conjunctivae normal.  ?Neck:  ?   Thyroid: No thyromegaly.  ?   Trachea: Trachea normal. No tracheal tenderness or tracheal deviation.  ?Cardiovascular:  ?   Rate and Rhythm: Normal rate and regular rhythm.  ?   Heart sounds: Normal heart sounds, S1 normal and S2 normal. No murmur heard. ?Pulmonary:  ?   Effort: No respiratory distress.  ?   Breath sounds: Normal breath sounds. No stridor. No wheezing or rales.  ?Lymphadenopathy:  ?   Head:  ?   Right side of head: No tonsillar adenopathy.  ?   Left side of head: No tonsillar adenopathy.  ?   Cervical: No cervical adenopathy.  ?Skin: ?   Findings: No erythema or rash.  ?   Nails: There is no clubbing.  ?Neurological:  ?   Mental Status: She is alert.  ? ? ?Diagnostics: Allergy skin tests were performed.  ? ?Spirometry was performed and demonstrated an FEV1 of 1.40 @  72 % of predicted. FEV1/FVC = 0.61 ? ?The patient had an Asthma Control Test with the following results:  .   ? ? ?Assessment and Plan:  ? ? ?No diagnosis found. ? ?Patient Instructions  ? ?1.  Allergen avoid

## 2021-04-05 NOTE — Progress Notes (Signed)
?[place] ? ? ?Dear Jerrell Belfast, ? ?Thank you for referring Allison Jimenez to the [facility] on 04/05/2021.  ? ?Below is a summation of this patient's evaluation and recommendations. ? ?Thank you for your referral. I will keep you informed about this patient's response to treatment.  ? ?If you have any questions please do not hesitate to contact me.  ? ?Sincerely, ? ?Jiles Prows, MD ?Allergy / Immunology ?[facility] ? ? ?______________________________________________________________________ ? ? ? ?NEW PATIENT NOTE ? ?Referring Provider: Rip Harbour, NP ?Primary Provider: Rochel Brome, MD ?Date of office visit: 04/05/2021 ?   ?Subjective:  ? ?Chief Complaint:  Allison Jimenez (DOB: [birth year]) is a 71 y.o. female who presents to the clinic on 04/05/2021 with a chief complaint of Allergies ?.    ? ?HPI:  Allison Jimenez presents to this clinic in evaluation of possible allergic disease. ? ?She has a long history of COPD/emphysema from tobacco smoking which is still an active issue at a rate of 10 to 12 cigarettes/day.  She has been treated with a triple inhaler and is doing quite well regarding that issue.  She can exert herself without any difficulty and rarely uses a short acting bronchodilator.  It does not sound as though she has had a exacerbation requiring the administration of a systemic steroid in years. ? ?She has "sinus issues".  She has nasal congestion and sneezing and some stuffiness and she has an itchy eyes occurring on a perennial basis with spring and fall exacerbation especially following exposure to the outdoors that is still an active issue while using Flonase, montelukast, and Zyrtec.  She states that she gets a sinus infection about 3 times per year. ? ?She also has constant postnasal drip and throat clearing and a tickle in her throat and the sensation that  she cannot clear out her throat especially in the mornings.  She does have reflux disease treated with pantoprazole.  Apparently she had a GI bleed from gastric ulcers in 2022 with a hemoglobin of 8 for which she received an upper endoscopy by Dr. Lyda Jester, GI, and she started pantoprazole at that point in time.  She eats chocolate daily ? ?She has a history of migraine headaches that can occur from 0-2 times per week for which she is on Aimovig.  She uses Tylenol and occasionally Maxalt. ? ?Past Medical History:  ?Diagnosis Date  ? Acute blood loss anemia (ABLA) 09/05/2020  ? Allergy   ? ANXIETY 06/05/2006  ? Arthritis   ? SHOULDERS   ? BACK PAIN 01/05/2010  ? Cataract   ? bilateral, left worse  ? Centrilobular emphysema ([place])   ? Centrilobular emphysema ([place])   ? COPD (chronic obstructive pulmonary disease) ([place])   ? DIVERTICULOSIS, COLON 06/05/2006  ? DIZZINESS OR VERTIGO 06/05/2006  ? positional  ? Hearing loss in left ear   ? HYPERLIPIDEMIA 06/05/2006  ? HYPOKALEMIA 12/14/2008  ? MENIERE'S DISEASE 06/25/2006  ? 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 Qualifier: Diagnosis of  By: Burnice Logan  MD, Doretha Sou   ? Migraine   ? TOBACCO USER 11/25/2007  ? ? ?Past Surgical History:  ?Procedure Laterality Date  ? APPENDECTOMY    ? CESAREAN SECTION    ? x1  ? CHOLECYSTECTOMY    ? COLONOSCOPY    ? COSMETIC SURGERY    ? mastoid surgery  1992  ? shunt in mastoid- endolymphatic sac in left ear   ? TUBAL LIGATION    ? vocal cord surgery    ? nodule x2  ? ? ?Allergies as of 04/05/2021   ? ?   Reactions  ? Erythromycin Base Diarrhea  ? Penicillins Rash  ? ?  ? ?  ?Medication List  ? ? ?Aimovig 140 MG/ML Soaj ?Generic drug: Erenumab-aooe ?Every month ?  ?albuterol 108 (90 Base) MCG/ACT inhaler ?Commonly known as: VENTOLIN HFA ?Inhale into the lungs. ?  ?ALPRAZolam 0.25 MG tablet ?Commonly known as: Duanne Moron ?TAKE 1 TABLET BY MOUTH THREE TIMES DAILY ?  ?amLODipine 2.5 MG tablet ?Commonly known as: NORVASC ?Take 1 tablet (2.5 mg total) by mouth daily. ?   ?atorvastatin 20 MG tablet ?Commonly known as: LIPITOR ?TAKE 1 TABLET(20 MG) BY MOUTH DAILY ?  ?chlorthalidone 25 MG tablet ?Commonly known as: HYGROTON ?TAKE 1 TABLET(25 MG) BY MOUTH EVERY DAY ?  ?cyclobenzaprine 10 MG tablet ?Commonly known as: FLEXERIL ?Take by mouth. ?  ?diclofenac Sodium 1 % Gel ?Commonly known as: VOLTAREN ?diclofenac 1 % topical gel ?  ?estradiol 0.1 MG/GM vaginal cream ?Commonly known as: ESTRACE ?Place 1 Applicatorful vaginally at bedtime. Daily for 14 days then twice weekly ?  ?famotidine 40 MG tablet ?Commonly known as: PEPCID ?Take 1 tablet (40 mg total) by mouth at bedtime. ?Started by: Jiles Prows, MD ?  ?FLUoxetine 20 MG capsule ?Commonly known as: PROZAC ?Take 2 capsules (40 mg total) by mouth daily. ?  ?fluticasone 50 MCG/ACT nasal spray ?Commonly known as: FLONASE ?Place 2 sprays into both nostrils daily. ?  ?meloxicam 7.5 MG tablet ?Commonly known as: MOBIC ?Take 1 tablet (7.5 mg total) by mouth 2 (two) times daily. ?  ?montelukast 10 MG tablet ?Commonly known as: SINGULAIR ?TAKE 1 TABLET(10 MG) BY MOUTH AT BEDTIME ?  ?nystatin 100000 UNIT/ML suspension ?Commonly known as: MYCOSTATIN ?Take 5 mLs (500,000 Units total) by mouth 4 (four) times daily. Swish, gargle and spit x 1 week ?  ?nystatin-triamcinolone cream ?Commonly known as: MYCOLOG II ?Apply twice a day prn ?  ?ondansetron 4 MG tablet ?Commonly known as: ZOFRAN ?TAKE 1 TABLET(4 MG) BY MOUTH EVERY 8 HOURS AS NEEDED FOR NAUSEA OR VOMITING ?  ?pantoprazole 40 MG tablet ?Commonly known as: PROTONIX ?TAKE 1 TABLET(40 MG) BY MOUTH DAILY ?  ?potassium chloride 10 MEQ CR capsule ?Commonly known as: MICRO-K ?TAKE 2 CAPSULES(20 MEQ) BY MOUTH DAILY ?  ?promethazine 25 MG tablet ?Commonly known as: PHENERGAN ?TAKE 1 TABLET BY MOUTH EVERY 8 HOURS IF NEEDED ?  ?rizatriptan 10 MG tablet ?Commonly known as: Maxalt ?Take 1 tablet (10 mg total) by mouth as needed for migraine. May repeat in 2 hours if needed ?  ?traZODone 50 MG  tablet ?Commonly known as: DESYREL ?Take 1 tablet (50 mg total) by mouth at bedtime as needed for sleep. ?  ?Trelegy Ellipta 100-62.5-25 MCG/ACT Aepb ?Generic drug: Fluticasone-Umeclidin-Vilant ?Inhale 1 puff into the lungs daily. ?  ? ?Review of systems negative except as noted in HPI / PMHx or noted below: ? ?Review of Systems  ?Constitutional: Negative.   ?HENT: Negative.    ?Eyes: Negative.   ?Respiratory: Negative.    ?Cardiovascular: Negative.   ?Gastrointestinal: Negative.   ?Genitourinary: Negative.   ?Musculoskeletal: Negative.   ?Skin: Negative.   ?Neurological: Negative.   ?Endo/Heme/Allergies: Negative.   ?Psychiatric/Behavioral: Negative.    ? ?Family History  ?Problem Relation Age of Onset  ? Breast cancer  Mother   ? Migraines Other   ? Depression Other   ? Anxiety disorder Other   ? High Cholesterol Other   ? Colon cancer Neg Hx   ? Rectal cancer Neg Hx   ? Stomach cancer Neg Hx   ? Colon polyps Neg Hx   ? Esophageal cancer Neg Hx   ? ? ?Social History  ? ?Socioeconomic History  ? Marital status: Divorced  ?  Spouse name: Not on file  ? Number of children: 1  ? Years of education: 25  ? Highest education level: Not on file  ?Occupational History  ?  Employer: [employer]  ?  Comment: Retired  ?Tobacco Use  ? Smoking status: Every Day  ?  Packs/day: 1.50  ?  Years: 49.00  ?  Pack years: 73.50  ?  Types: Cigarettes  ? Smokeless tobacco: Never  ? Tobacco comments:  ?  Currently 1/2 ppd, uses vape   ?Vaping Use  ? Vaping Use: Some days  ? Start date: 01/01/2010  ? Substances: Nicotine  ? Devices: menthol/fruit flavor  ?Substance and Sexual Activity  ? Alcohol use: No  ?  Alcohol/week: 0.0 standard drinks  ? Drug use: No  ? Sexual activity: Not on file  ?Other Topics Concern  ? Not on file  ?Social History Narrative  ? Patient lives at home alone. Patient has a Data processing manager. Patient is divorced .  ? Patient is retired.  ? Education high school.  ? Right handed.  ? Caffeine none.   ? ?Environmental and Social history ? ?Lives in a house with a dry environment, cats located inside the household, carpet in the bedroom, no plastic on the bed, no plastic on the pillow, actively smoking tobacco produc

## 2021-04-05 NOTE — Patient Instructions (Addendum)
?  1.  Allergen avoidance measures - dust mite, cat, tree pollen ? ?2.  Use nicotine substitutes to decrease tobacco smoke exposure ? ?3.  Treat and prevent inflammation: ? ?A. Trelegy - 1 inhalation 1 time per day ?B. Flonase - 1-2 sprays each nostril 1 time per day ?C. Montelukast 10 mg - 1 tablet 1 time per day ? ?4.  Treat and prevent reflux/LPR: ? ?A. Pantoprazole 40 mg - 1 tablet in AM ?B. Famotidine 40 mg - 1 tablet in PM ?C. Replace throat clearing with swallowing/drinking maneuver ? ?5.  If needed: ? ?A. Zyrtec 10 mg - 1 tablet 1 time per day ?B. Albuterol HFA - 2 inhalations every 4-6 hours ?C. Nasal saline ?D. OTC Systane eye drops ? ?6.  Return to clinic in 4 weeks or earlier if problem ?

## 2021-04-06 ENCOUNTER — Encounter: Payer: Self-pay | Admitting: Allergy and Immunology

## 2021-04-06 DIAGNOSIS — M5489 Other dorsalgia: Secondary | ICD-10-CM | POA: Diagnosis not present

## 2021-04-06 DIAGNOSIS — M542 Cervicalgia: Secondary | ICD-10-CM | POA: Diagnosis not present

## 2021-04-19 ENCOUNTER — Other Ambulatory Visit: Payer: Self-pay | Admitting: Family Medicine

## 2021-04-20 ENCOUNTER — Ambulatory Visit (INDEPENDENT_AMBULATORY_CARE_PROVIDER_SITE_OTHER): Payer: Medicare PPO | Admitting: Family Medicine

## 2021-04-20 ENCOUNTER — Encounter: Payer: Self-pay | Admitting: Family Medicine

## 2021-04-20 VITALS — BP 110/72 | HR 80 | Temp 97.2°F | Resp 14 | Ht 60.0 in | Wt 122.0 lb

## 2021-04-20 DIAGNOSIS — E039 Hypothyroidism, unspecified: Secondary | ICD-10-CM | POA: Diagnosis not present

## 2021-04-20 DIAGNOSIS — J449 Chronic obstructive pulmonary disease, unspecified: Secondary | ICD-10-CM

## 2021-04-20 DIAGNOSIS — I7143 Infrarenal abdominal aortic aneurysm, without rupture: Secondary | ICD-10-CM | POA: Diagnosis not present

## 2021-04-20 DIAGNOSIS — H8103 Meniere's disease, bilateral: Secondary | ICD-10-CM | POA: Diagnosis not present

## 2021-04-20 DIAGNOSIS — F330 Major depressive disorder, recurrent, mild: Secondary | ICD-10-CM

## 2021-04-20 DIAGNOSIS — R7303 Prediabetes: Secondary | ICD-10-CM | POA: Diagnosis not present

## 2021-04-20 DIAGNOSIS — I10 Essential (primary) hypertension: Secondary | ICD-10-CM | POA: Diagnosis not present

## 2021-04-20 DIAGNOSIS — E785 Hyperlipidemia, unspecified: Secondary | ICD-10-CM | POA: Diagnosis not present

## 2021-04-20 MED ORDER — DICYCLOMINE HCL 20 MG PO TABS: 20.0000 mg | ORAL_TABLET | Freq: Two times a day (BID) | ORAL | 1 refills | Status: DC | PRN

## 2021-04-20 NOTE — Progress Notes (Signed)
? ?Subjective:  ?Patient ID: Allison Jimenez, female    DOB: [birth year]  Age: 71 y.o. MRN: [id number] ? ?Chief Complaint  ?Patient presents with  ? Hypertension  ? Hypothyroidism  ? Hyperlipidemia  ? ?HPI ? Prediabetes:  ?Most recent A1C: 5.7 ?Current medications: none ? ?Hyperlipidemia: ?Current medications: atorvastatin 20 mg daily.  ? ?Hypertension: ?Complications: ?Current medications: chlorthalidone 25 mg daily, amlodipine 2.5 mg once daily.  ? ?Migraines: on aimovig. Neurology manages.  ? ?Copd: trelegy one inhalation daily. Still smoking, but decreased.  ? ?Allergies: allergic to cats, pollen. On flonase. On singulair 10 mg daily.  ? ?Tobacco: decreased.  ? ?GERD: on protonix 40 mg daily.  ?Depression: on prozac 20 mg 2 daily.  ? ?  04/20/2021  ? 10:19 AM 01/21/2021  ? 12:33 PM 11/01/2020  ?  1:23 PM  ?PHQ9 SCORE ONLY  ?PHQ-9 Total Score '3 2 1  '$ ? ? ?Diet: fair ?Exercise: none ? ?Current Outpatient Medications on File Prior to Visit  ?Medication Sig Dispense Refill  ? albuterol (VENTOLIN HFA) 108 (90 Base) MCG/ACT inhaler Inhale into the lungs.    ? amLODipine (NORVASC) 2.5 MG tablet TAKE 1 TABLET(2.5 MG) BY MOUTH DAILY 90 tablet 0  ? atorvastatin (LIPITOR) 20 MG tablet TAKE 1 TABLET(20 MG) BY MOUTH DAILY 90 tablet 1  ? chlorthalidone (HYGROTON) 25 MG tablet TAKE 1 TABLET(25 MG) BY MOUTH EVERY DAY 90 tablet 4  ? cyclobenzaprine (FLEXERIL) 10 MG tablet Take by mouth.    ? diclofenac Sodium (VOLTAREN) 1 % GEL diclofenac 1 % topical gel 100 g 0  ? Erenumab-aooe (AIMOVIG) 140 MG/ML SOAJ Every month    ? estradiol (ESTRACE) 0.1 MG/GM vaginal cream Place 1 Applicatorful vaginally at bedtime. Daily for 14 days then twice weekly 42.5 g 0  ? famotidine (PEPCID) 40 MG tablet Take 1 tablet (40 mg total) by mouth at bedtime. 30 tablet 5  ? FLUoxetine (PROZAC) 20 MG capsule Take 2 capsules (40 mg total) by mouth daily. 180 capsule 1  ? Fluticasone-Umeclidin-Vilant (TRELEGY ELLIPTA) 100-62.5-25 MCG/ACT AEPB Inhale 1 puff into  the lungs daily. 1 each 11  ? meloxicam (MOBIC) 7.5 MG tablet Take 1 tablet (7.5 mg total) by mouth 2 (two) times daily. 90 tablet 1  ? montelukast (SINGULAIR) 10 MG tablet TAKE 1 TABLET(10 MG) BY MOUTH AT BEDTIME 90 tablet 2  ? nystatin (MYCOSTATIN) 100000 UNIT/ML suspension Take 5 mLs (500,000 Units total) by mouth 4 (four) times daily. Swish, gargle and spit x 1 week 473 mL 0  ? nystatin-triamcinolone (MYCOLOG II) cream Apply twice a day prn 60 g 0  ? ondansetron (ZOFRAN) 4 MG tablet TAKE 1 TABLET(4 MG) BY MOUTH EVERY 8 HOURS AS NEEDED FOR NAUSEA OR VOMITING 90 tablet 0  ? pantoprazole (PROTONIX) 40 MG tablet TAKE 1 TABLET(40 MG) BY MOUTH DAILY 90 tablet 0  ? potassium chloride (MICRO-K) 10 MEQ CR capsule TAKE 2 CAPSULES(20 MEQ) BY MOUTH DAILY 180 capsule 0  ? promethazine (PHENERGAN) 25 MG tablet TAKE 1 TABLET BY MOUTH EVERY 8 HOURS IF NEEDED 20 tablet 0  ? rizatriptan (MAXALT) 10 MG tablet Take 1 tablet (10 mg total) by mouth as needed for migraine. May repeat in 2 hours if needed 10 tablet 0  ? traZODone (DESYREL) 50 MG tablet Take 1 tablet (50 mg total) by mouth at bedtime as needed for sleep. 30 tablet 3  ? ?No current facility-administered medications on file prior to visit.  ? ?Past Medical History:  ?Diagnosis  Date  ? Acute blood loss anemia (ABLA) 09/05/2020  ? Allergy   ? ANXIETY 06/05/2006  ? Arthritis   ? SHOULDERS   ? BACK PAIN 01/05/2010  ? Cataract   ? bilateral, left worse  ? Centrilobular emphysema ([place])   ? Centrilobular emphysema ([place])   ? COPD (chronic obstructive pulmonary disease) ([place])   ? DIVERTICULOSIS, COLON 06/05/2006  ? DIZZINESS OR VERTIGO 06/05/2006  ? positional  ? Hearing loss in left ear   ? HYPERLIPIDEMIA 06/05/2006  ? HYPOKALEMIA 12/14/2008  ? MENIERE'S DISEASE 06/25/2006  ? 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 Qualifier: Diagnosis of  By: Burnice Logan  MD, Doretha Sou   ? Migraine   ? TOBACCO USER 11/25/2007  ? ?Past Surgical History:  ?Procedure Laterality Date  ? APPENDECTOMY    ? CESAREAN SECTION     ? x1  ? CHOLECYSTECTOMY    ? COLONOSCOPY    ? COSMETIC SURGERY    ? mastoid surgery  1992  ? shunt in mastoid- endolymphatic sac in left ear   ? TUBAL LIGATION    ? vocal cord surgery    ? nodule x2  ?  ?Family History  ?Problem Relation Age of Onset  ? Breast cancer Mother   ? Migraines Other   ? Depression Other   ? Anxiety disorder Other   ? High Cholesterol Other   ? Colon cancer Neg Hx   ? Rectal cancer Neg Hx   ? Stomach cancer Neg Hx   ? Colon polyps Neg Hx   ? Esophageal cancer Neg Hx   ? ?Social History  ? ?Socioeconomic History  ? Marital status: Divorced  ?  Spouse name: Not on file  ? Number of children: 1  ? Years of education: 11  ? Highest education level: Not on file  ?Occupational History  ?  Employer: [employer]  ?  Comment: Retired  ?Tobacco Use  ? Smoking status: Every Day  ?  Packs/day: 1.50  ?  Years: 49.00  ?  Pack years: 73.50  ?  Types: Cigarettes  ? Smokeless tobacco: Never  ? Tobacco comments:  ?  Currently 1/2 ppd, uses vape   ?Vaping Use  ? Vaping Use: Some days  ? Start date: 01/01/2010  ? Substances: Nicotine  ? Devices: menthol/fruit flavor  ?Substance and Sexual Activity  ? Alcohol use: No  ?  Alcohol/week: 0.0 standard drinks  ? Drug use: No  ? Sexual activity: Not on file  ?Other Topics Concern  ? Not on file  ?Social History Narrative  ? Patient lives at home alone. Patient has a Data processing manager. Patient is divorced .  ? Patient is retired.  ? Education high school.  ? Right handed.  ? Caffeine none.  ? ?Social Determinants of Health  ? ?Financial Resource Strain: Not on file  ?Food Insecurity: Not on file  ?Transportation Needs: Not on file  ?Physical Activity: Not on file  ?Stress: Not on file  ?Social Connections: Not on file  ? ? ?Review of Systems  ?Constitutional:  Positive for fatigue. Negative for chills.  ?HENT:  Negative for congestion.   ?Respiratory:  Positive for cough. Negative for shortness of breath.   ?Cardiovascular:  Negative for chest pain  and palpitations.  ?Gastrointestinal:  Positive for constipation (miralax helps .). Negative for abdominal pain.  ?Genitourinary:  Negative for dysuria.  ?Musculoskeletal:  Positive for back pain (seeing physical therapy.) and myalgias.  ?Neurological:  Positive for dizziness and headaches.  ?  Psychiatric/Behavioral:  Negative for dysphoric mood. The patient is not nervous/anxious.   ? ? ?Objective:  ?BP 110/72   Pulse 80   Temp (!) 97.2 ?F (36.2 ?C)   Resp 14   Ht 5' (1.524 m)   Wt 122 lb (55.3 kg)   BMI 23.83 kg/m?  ? ? ?  05/09/2021  ?  3:53 PM 05/09/2021  ?  3:05 PM 04/20/2021  ? 10:09 AM  ?BP/Weight  ?Systolic BP 295 621 308  ?Diastolic BP 70 62 72  ?Wt. (Lbs)  122 122  ?BMI  23.83 kg/m2 23.83 kg/m2  ? ? ?Physical Exam ?Vitals reviewed.  ?Constitutional:   ?   Appearance: Normal appearance. She is normal weight.  ?Neck:  ?   Vascular: No carotid bruit.  ?Cardiovascular:  ?   Rate and Rhythm: Normal rate and regular rhythm.  ?   Heart sounds: Normal heart sounds.  ?Pulmonary:  ?   Effort: Pulmonary effort is normal. No respiratory distress.  ?   Breath sounds: Normal breath sounds. No rales.  ?Abdominal:  ?   General: Abdomen is flat. Bowel sounds are normal.  ?   Palpations: Abdomen is soft.  ?   Tenderness: There is no abdominal tenderness.  ?Neurological:  ?   Mental Status: She is alert and oriented to person, place, and time.  ?Psychiatric:     ?   Mood and Affect: Mood normal.     ?   Behavior: Behavior normal.  ? ? ?Diabetic Foot Exam - Simple   ?No data filed ?  ?  ? ?Lab Results  ?Component Value Date  ? WBC 13.5 (H) 05/09/2021  ? HGB 12.8 05/09/2021  ? HCT 37.7 05/09/2021  ? PLT 370 05/09/2021  ? GLUCOSE 112 (H) 05/09/2021  ? CHOL 141 04/20/2021  ? TRIG 113 04/20/2021  ? HDL 55 04/20/2021  ? LDLDIRECT 151.9 09/13/2011  ? [place] 66 04/20/2021  ? ALT 17 05/09/2021  ? AST 19 05/09/2021  ? NA 132 (L) 05/09/2021  ? K 4.0 05/09/2021  ? CL 89 (L) 05/09/2021  ? CREATININE 0.70 05/09/2021  ? BUN 15 05/09/2021   ? CO2 22 05/09/2021  ? TSH 2.810 04/20/2021  ? HGBA1C 6.1 (H) 04/20/2021  ? ? ? ? ?Assessment & Plan:  ? ?Problem List Items Addressed This Visit   ? ?  ? Cardiovascular and Mediastinum  ? Essential hypertens

## 2021-04-21 LAB — LIPID PANEL
Chol/HDL Ratio: 2.6 ratio (ref 0.0–4.4)
Cholesterol, Total: 141 mg/dL (ref 100–199)
HDL: 55 mg/dL (ref >39)
LDL Chol Calc (NIH): 66 mg/dL (ref 0–99)
Triglycerides: 113 mg/dL (ref 0–149)
VLDL Cholesterol Cal: 20 mg/dL (ref 5–40)

## 2021-04-21 LAB — CBC WITH DIFFERENTIAL/PLATELET
Basophils Absolute: 0.1 10*3/uL (ref 0.0–0.2)
Basos: 1 %
EOS (ABSOLUTE): 0.4 10*3/uL (ref 0.0–0.4)
Eos: 3 %
Hematocrit: 37.8 % (ref 34.0–46.6)
Hemoglobin: 12.7 g/dL (ref 11.1–15.9)
Immature Grans (Abs): 0.1 10*3/uL (ref 0.0–0.1)
Immature Granulocytes: 1 %
Lymphocytes Absolute: 2.8 10*3/uL (ref 0.7–3.1)
Lymphs: 24 %
MCH: 30.2 pg (ref 26.6–33.0)
MCHC: 33.6 g/dL (ref 31.5–35.7)
MCV: 90 fL (ref 79–97)
Monocytes Absolute: 0.8 10*3/uL (ref 0.1–0.9)
Monocytes: 7 %
Neutrophils Absolute: 7.2 10*3/uL — ABNORMAL HIGH (ref 1.4–7.0)
Neutrophils: 64 %
Platelets: 429 10*3/uL (ref 150–450)
RBC: 4.2 x10E6/uL (ref 3.77–5.28)
RDW: 12.9 % (ref 11.7–15.4)
WBC: 11.3 10*3/uL — ABNORMAL HIGH (ref 3.4–10.8)

## 2021-04-21 LAB — COMPREHENSIVE METABOLIC PANEL
ALT: 19 IU/L (ref 0–32)
AST: 16 IU/L (ref 0–40)
Albumin/Globulin Ratio: 1.7 (ref 1.2–2.2)
Albumin: 4.3 g/dL (ref 3.8–4.8)
Alkaline Phosphatase: 74 IU/L (ref 44–121)
BUN/Creatinine Ratio: 13 (ref 12–28)
BUN: 8 mg/dL (ref 8–27)
Bilirubin Total: 0.3 mg/dL (ref 0.0–1.2)
CO2: 24 mmol/L (ref 20–29)
Calcium: 10.1 mg/dL (ref 8.7–10.3)
Chloride: 90 mmol/L — ABNORMAL LOW (ref 96–106)
Creatinine, Ser: 0.62 mg/dL (ref 0.57–1.00)
Globulin, Total: 2.5 g/dL (ref 1.5–4.5)
Glucose: 95 mg/dL (ref 70–99)
Potassium: 3.7 mmol/L (ref 3.5–5.2)
Sodium: 130 mmol/L — ABNORMAL LOW (ref 134–144)
Total Protein: 6.8 g/dL (ref 6.0–8.5)
eGFR: 96 mL/min/{1.73_m2} (ref >59)

## 2021-04-21 LAB — T4, FREE: Free T4: 0.9 ng/dL (ref 0.82–1.77)

## 2021-04-21 LAB — HEMOGLOBIN A1C
Est. average glucose Bld gHb Est-mCnc: 128 mg/dL
Hgb A1c MFr Bld: 6.1 % — ABNORMAL HIGH (ref 4.8–5.6)

## 2021-04-21 LAB — TSH: TSH: 2.81 u[IU]/mL (ref 0.450–4.500)

## 2021-04-24 ENCOUNTER — Other Ambulatory Visit: Payer: Self-pay | Admitting: Nurse Practitioner

## 2021-04-24 DIAGNOSIS — J0181 Other acute recurrent sinusitis: Secondary | ICD-10-CM

## 2021-04-25 ENCOUNTER — Ambulatory Visit (INDEPENDENT_AMBULATORY_CARE_PROVIDER_SITE_OTHER): Payer: Medicare PPO

## 2021-04-25 DIAGNOSIS — J449 Chronic obstructive pulmonary disease, unspecified: Secondary | ICD-10-CM

## 2021-04-25 DIAGNOSIS — I10 Essential (primary) hypertension: Secondary | ICD-10-CM

## 2021-04-25 NOTE — Patient Instructions (Signed)
Visit Information ? ?Thank you for taking time to visit with me today. Please don't hesitate to contact me if I can be of assistance to you before our next scheduled telephone appointment. ? ?Following are the goals we discussed today:  ?Patient Goals/Self-Care Activities: ?Take medications as prescribed   ?Attend all scheduled provider appointments ?Call provider office for new concerns or questions  ?limit outdoor activity during cold weather ?listen for public air quality announcements every day ?follow rescue plan if symptoms flare-up ?keep follow-up appointments: PCP and this care manager ?Continue to try to cut down on smoking. 1-800-Quit-Now [phone]).  ?keep a blood pressure log ?take blood pressure log to all doctor appointments ?call doctor for signs and symptoms of high blood pressure ?Monitor blood pressure per MD orders  ? ?Your next appointment with Tomasa Rand, RN Case Manager is by telephone on 06/29/21 at 2:00 pm ? ?Please call the care guide team at [phone] if you need to cancel or reschedule your appointment.  ? ?If you are experiencing a Mental Health or [place] or need someone to talk to, please call the Suicide and Crisis Lifeline: 988 ?call 1-800-273-TALK (toll free, 24 hour hotline)  ? ?Patient verbalizes understanding of instructions and care plan provided today and agrees to view in [place]. Active MyChart status confirmed with patient.   ? ?Thea Silversmith, RN, MSN, BSN, CCM ?Care Management Coordinator ?[phone]  ?

## 2021-04-25 NOTE — Chronic Care Management (AMB) (Signed)
Chronic Care Management   CCM RN Visit Note  04/25/2021 Name: Allison Jimenez MRN: [id number] DOB: 08-02-1950  Subjective: Allison Jimenez is a 71 y.o. year old female who is a primary care patient of Cox, Kirsten, MD. The care management team was consulted for assistance with disease management and care coordination needs.    Engaged with patient by telephone for follow up visit in response to provider referral for case management and/or care coordination services.   Consent to Services:  The patient was given information about Chronic Care Management services, agreed to services, and gave verbal consent prior to initiation of services.  Please see initial visit note for detailed documentation.   Patient agreed to services and verbal consent obtained.   Assessment: Review of patient past medical history, allergies, medications, health status, including review of consultants reports, laboratory and other test data, was performed as part of comprehensive evaluation and provision of chronic care management services.   SDOH (Social Determinants of Health) assessments and interventions performed:    CCM Care Plan  Allergies  Allergen Reactions   Erythromycin Base Diarrhea   Penicillins Rash    Outpatient Encounter Medications as of 04/25/2021  Medication Sig   albuterol (VENTOLIN HFA) 108 (90 Base) MCG/ACT inhaler Inhale into the lungs.   ALPRAZolam (XANAX) 0.25 MG tablet TAKE 1 TABLET BY MOUTH THREE TIMES DAILY   amLODipine (NORVASC) 2.5 MG tablet TAKE 1 TABLET(2.5 MG) BY MOUTH DAILY   atorvastatin (LIPITOR) 20 MG tablet TAKE 1 TABLET(20 MG) BY MOUTH DAILY   chlorthalidone (HYGROTON) 25 MG tablet TAKE 1 TABLET(25 MG) BY MOUTH EVERY DAY   cyclobenzaprine (FLEXERIL) 10 MG tablet Take by mouth.   diclofenac Sodium (VOLTAREN) 1 % GEL diclofenac 1 % topical gel   dicyclomine (BENTYL) 20 MG tablet Take 1 tablet (20 mg total) by mouth 2 (two) times daily as needed for spasms.   Erenumab-aooe  (AIMOVIG) 140 MG/ML SOAJ Every month   estradiol (ESTRACE) 0.1 MG/GM vaginal cream Place 1 Applicatorful vaginally at bedtime. Daily for 14 days then twice weekly   famotidine (PEPCID) 40 MG tablet Take 1 tablet (40 mg total) by mouth at bedtime.   FLUoxetine (PROZAC) 20 MG capsule Take 2 capsules (40 mg total) by mouth daily.   fluticasone (FLONASE) 50 MCG/ACT nasal spray SHAKE LIQUID AND USE 2 SPRAYS IN EACH NOSTRIL DAILY   Fluticasone-Umeclidin-Vilant (TRELEGY ELLIPTA) 100-62.5-25 MCG/ACT AEPB Inhale 1 puff into the lungs daily.   meloxicam (MOBIC) 7.5 MG tablet Take 1 tablet (7.5 mg total) by mouth 2 (two) times daily.   montelukast (SINGULAIR) 10 MG tablet TAKE 1 TABLET(10 MG) BY MOUTH AT BEDTIME   nystatin (MYCOSTATIN) 100000 UNIT/ML suspension Take 5 mLs (500,000 Units total) by mouth 4 (four) times daily. Swish, gargle and spit x 1 week   nystatin-triamcinolone (MYCOLOG II) cream Apply twice a day prn   ondansetron (ZOFRAN) 4 MG tablet TAKE 1 TABLET(4 MG) BY MOUTH EVERY 8 HOURS AS NEEDED FOR NAUSEA OR VOMITING   pantoprazole (PROTONIX) 40 MG tablet TAKE 1 TABLET(40 MG) BY MOUTH DAILY   potassium chloride (MICRO-K) 10 MEQ CR capsule TAKE 2 CAPSULES(20 MEQ) BY MOUTH DAILY   promethazine (PHENERGAN) 25 MG tablet TAKE 1 TABLET BY MOUTH EVERY 8 HOURS IF NEEDED   rizatriptan (MAXALT) 10 MG tablet Take 1 tablet (10 mg total) by mouth as needed for migraine. May repeat in 2 hours if needed   traZODone (DESYREL) 50 MG tablet Take 1 tablet (50 mg  total) by mouth at bedtime as needed for sleep.   [DISCONTINUED] fluticasone (FLONASE) 50 MCG/ACT nasal spray Place 2 sprays into both nostrils daily.   No facility-administered encounter medications on file as of 04/25/2021.    Patient Active Problem List   Diagnosis Date Noted   Chronic vaginitis 01/21/2021   Acute cystitis without hematuria 01/21/2021   COPD (chronic obstructive pulmonary disease) ([place]) 01/19/2021   Aneurysm of infrarenal abdominal  aorta ([place]) 09/05/2020   Hypothyroidism (acquired) 07/10/2020   Prediabetes 07/10/2020   Ocular migraine with status migrainosus 07/10/2020   Essential hypertension 06/15/2019   Major depressive disorder, recurrent episode, mild ([place]) 09/10/2018   Mixed conductive and sensorineural hearing loss of left ear with restricted hearing of right ear 01/24/2017   Meniere's disease in remission, bilateral 01/24/2017   TOBACCO USER 11/25/2007   Dyslipidemia 06/05/2006   DIVERTICULOSIS, COLON 06/05/2006    Conditions to be addressed/monitored:HTN, COPD, Asthma, Tobacco Use, and Advance directives  Care Plan : RN Care Manager Plan of Care  Updates made by Luretha Rued, RN since 04/25/2021 12:00 AM     Problem: No plan of care established for chronic disease states ( COPD, smoking, no advanced directives, hypertension)   Priority: High     Long-Range Goal: Development of plan of care for the management of chronic disease states ( COPD, Smoking, no advanced directives, hypertension)   Start Date: 03/07/2021  Expected End Date: 03/08/2022  Priority: High  Note:   Current Barriers:  Chronic Disease Management support and education needs related to HTN, COPD, and smoking, no advanced directives 03/07/2021 COPD-  Patient reports that she feels like her COPD is getting better. Reports that she continues to use her medications as prescribed. Reports she continues to smoke 1/2 pack per day/ Describes difficulty in smoking cessation.    Reports she is having sinus issues and saw ENT who stated it was viral. Reports she was told that her Left ear is dead and she has moderate hearing loss in the right ear.  Advanced Directives- Patient reports that she has the packet that was mailed to her but has not reviewed the packet.  04/25/21 Allison Jimenez reports allergy to cat hair, dust mites and tree pollen. She reports she has cats and is not willing to let cats go somewhere else, therefore does what she can to lessen  the cat hair. Office visit with asthma/allergist on 04/05/21. Denies any signs/symptoms of COPD exacerbation. Last office visit with pulmonologist 03/21/21. She reports she has not had to use her albuterol inhaler recently. She denies any questions or concerns at this time.  RNCM Clinical Goal(s):  Patient will demonstrate ongoing adherence to prescribed treatment plan for HTN, COPD, and no advanced directives, smoking as evidenced by patient verbal report and reviewed of EMR  through collaboration with RN Care manager, provider, and care team.   Interventions: 1:1 collaboration with primary care provider regarding development and update of comprehensive plan of care as evidenced by provider attestation and co-signature Inter-disciplinary care team collaboration (see longitudinal plan of care) Evaluation of current treatment plan related to  self management and patient's adherence to plan as established by provider  COPD: (Status: Goal on Track (progressing): YES.) Long Term Goal  Reviewed medications with patient, including use of prescribed maintenance and rescue inhalers, and provided instruction on medication management and the importance of adherence Advised patient to track and manage COPD triggers Advised patient to self assesses COPD action plan zone and make appointment with  provider if in the yellow zone for 48 hours without improvement Provided support and encouraged patient to continue efforts in smoking cessation Confirmed she has the 1800 quite now Discussed strategies to lessen allergy triggers  No advanced directives  (Status: Goal on Track (progressing): YES.) Long Term Goal  Evaluation of current treatment plan related to  no advanced directives , self-management and patient's adherence to plan as established by provider. Discussed plans with patient for ongoing care management follow up and provided patient with direct contact information for care management team Reviewed process  for completing advanced directives. Instructed once completed to provide a copy to PCP office    Hypertension: (Status: Goal on Track (progressing): YES.) Long Term Goal  Last practice recorded BP readings:  BP Readings from Last 3 Encounters:  04/20/21 110/72  04/05/21 118/72  03/21/21 128/74  Most recent eGFR/CrCl:  Lab Results  Component Value Date   EGFR 96 04/20/2021    No components found for: CRCL  Evaluation of current treatment plan related to hypertension self management and patient's adherence to plan as established by provider;   Reviewed medications with patient and discussed importance of compliance;  Discussed plans with patient for ongoing care management follow up and provided patient with direct contact information for care management team; Reviewed scheduled/upcoming provider appointments including:   Patient Goals/Self-Care Activities: Take medications as prescribed   Attend all scheduled provider appointments Call provider office for new concerns or questions  limit outdoor activity during cold weather listen for public air quality announcements every day follow rescue plan if symptoms flare-up keep follow-up appointments: PCP and this care manager Continue to try to cut down on smoking. 1-800-Quit-Now [phone]).  keep a blood pressure log take blood pressure log to all doctor appointments call doctor for signs and symptoms of high blood pressure Monitor blood pressure per MD orders    Plan:Telephone follow up appointment with care management team member scheduled for:  06/29/21 The patient has been provided with contact information for the care management team and has been advised to call with any health related questions or concerns.   Thea Silversmith, RN, MSN, BSN, CCM Care Management Coordinator [phone]

## 2021-04-30 DIAGNOSIS — I10 Essential (primary) hypertension: Secondary | ICD-10-CM | POA: Diagnosis not present

## 2021-04-30 DIAGNOSIS — J449 Chronic obstructive pulmonary disease, unspecified: Secondary | ICD-10-CM | POA: Diagnosis not present

## 2021-04-30 DIAGNOSIS — F17210 Nicotine dependence, cigarettes, uncomplicated: Secondary | ICD-10-CM | POA: Diagnosis not present

## 2021-05-01 DIAGNOSIS — M542 Cervicalgia: Secondary | ICD-10-CM | POA: Diagnosis not present

## 2021-05-01 DIAGNOSIS — M5489 Other dorsalgia: Secondary | ICD-10-CM | POA: Diagnosis not present

## 2021-05-04 ENCOUNTER — Ambulatory Visit: Payer: Medicare PPO | Admitting: Allergy and Immunology

## 2021-05-04 NOTE — Telephone Encounter (Signed)
This encounter was created in error - please disregard.

## 2021-05-05 ENCOUNTER — Ambulatory Visit: Payer: Medicare PPO

## 2021-05-05 DIAGNOSIS — M533 Sacrococcygeal disorders, not elsewhere classified: Secondary | ICD-10-CM | POA: Diagnosis not present

## 2021-05-09 ENCOUNTER — Ambulatory Visit (INDEPENDENT_AMBULATORY_CARE_PROVIDER_SITE_OTHER): Payer: Medicare PPO | Admitting: Family Medicine

## 2021-05-09 ENCOUNTER — Encounter: Payer: Self-pay | Admitting: Family Medicine

## 2021-05-09 VITALS — BP 152/70 | HR 88 | Temp 97.5°F | Ht 60.0 in | Wt 122.0 lb

## 2021-05-09 DIAGNOSIS — R232 Flushing: Secondary | ICD-10-CM | POA: Diagnosis not present

## 2021-05-09 DIAGNOSIS — R61 Generalized hyperhidrosis: Secondary | ICD-10-CM | POA: Diagnosis not present

## 2021-05-09 DIAGNOSIS — I10 Essential (primary) hypertension: Secondary | ICD-10-CM

## 2021-05-09 MED ORDER — KETOCONAZOLE 2 % EX CREA: 1.0000 "application " | TOPICAL_CREAM | Freq: Every day | CUTANEOUS | 0 refills | Status: DC

## 2021-05-09 MED ORDER — LINACLOTIDE 145 MCG PO CAPS: 145.0000 ug | ORAL_CAPSULE | Freq: Every day | ORAL | 0 refills | Status: DC

## 2021-05-09 MED ORDER — ALPRAZOLAM 0.25 MG PO TABS: ORAL_TABLET | ORAL | 2 refills | Status: DC

## 2021-05-09 NOTE — Patient Instructions (Signed)
Check bp and pulse daily.  ?Keep a log.  ?

## 2021-05-09 NOTE — Progress Notes (Unsigned)
Acute Office Visit  Subjective:    Patient ID: Allison Jimenez, female    DOB: 1950-04-27, 70 y.o.   MRN: [id number]  Chief Complaint  Patient presents with   Face feels hot    HPI Patient is in today for itchy, hot face x last few weeks. Then will get cold chills. No fever.  No new medicines/vitamins/herbals.  Happens several times per day. Lasts for 30 minutes. No set pattern.  Mood swings. Feels like going to cry a lot.   Complaining of hoarseness. Coughing up phlegm.  Taking flonase and zyrtec.   Past Medical History:  Diagnosis Date   Acute blood loss anemia (ABLA) 09/05/2020   Allergy    ANXIETY 06/05/2006   Arthritis    SHOULDERS    BACK PAIN 01/05/2010   Cataract    bilateral, left worse   Centrilobular emphysema (HCC)    Centrilobular emphysema (HCC)    COPD (chronic obstructive pulmonary disease) (HCC)    DIVERTICULOSIS, COLON 06/05/2006   DIZZINESS OR VERTIGO 06/05/2006   positional   Hearing loss in left ear    HYPERLIPIDEMIA 06/05/2006   HYPOKALEMIA 12/14/2008   MENIERE'S DISEASE 06/25/2006   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   Qualifier: Diagnosis of  By: Amador Cunas  MD, Janett Labella    Migraine    TOBACCO USER 11/25/2007    Past Surgical History:  Procedure Laterality Date   APPENDECTOMY     CESAREAN SECTION     x1   CHOLECYSTECTOMY     COLONOSCOPY     COSMETIC SURGERY     mastoid surgery  1992   shunt in mastoid- endolymphatic sac in left ear    TUBAL LIGATION     vocal cord surgery     nodule x2    Family History  Problem Relation Age of Onset   Breast cancer Mother    Migraines Other    Depression Other    Anxiety disorder Other    High Cholesterol Other    Colon cancer Neg Hx    Rectal cancer Neg Hx    Stomach cancer Neg Hx    Colon polyps Neg Hx    Esophageal cancer Neg Hx     Social History   Socioeconomic History   Marital status: Divorced    Spouse name: Not on file   Number of children: 1   Years of education: 13   Highest education  level: Not on file  Occupational History    Employer: [employer]    Comment: Retired  Tobacco Use   Smoking status: Every Day    Packs/day: 1.50    Years: 49.00    Pack years: 73.50    Types: Cigarettes   Smokeless tobacco: Never   Tobacco comments:    Currently 1/2 ppd, uses vape   Vaping Use   Vaping Use: Some days   Start date: 01/01/2010   Substances: Nicotine   Devices: menthol/fruit flavor  Substance and Sexual Activity   Alcohol use: No    Alcohol/week: 0.0 standard drinks   Drug use: No   Sexual activity: Not on file  Other Topics Concern   Not on file  Social History Narrative   Patient lives at home alone. Patient has a Biochemist, clinical. Patient is divorced .   Patient is retired.   Education high school.   Right handed.   Caffeine none.   Social Determinants of Health   Financial Resource Strain: Not on file  Food  Insecurity: Not on file  Transportation Needs: Not on file  Physical Activity: Not on file  Stress: Not on file  Social Connections: Not on file  Intimate Partner Violence: Not on file    Outpatient Medications Prior to Visit  Medication Sig Dispense Refill   albuterol (VENTOLIN HFA) 108 (90 Base) MCG/ACT inhaler Inhale into the lungs.     amLODipine (NORVASC) 2.5 MG tablet TAKE 1 TABLET(2.5 MG) BY MOUTH DAILY 90 tablet 0   atorvastatin (LIPITOR) 20 MG tablet TAKE 1 TABLET(20 MG) BY MOUTH DAILY 90 tablet 1   cyclobenzaprine (FLEXERIL) 10 MG tablet Take by mouth.     diclofenac Sodium (VOLTAREN) 1 % GEL diclofenac 1 % topical gel 100 g 0   dicyclomine (BENTYL) 20 MG tablet Take 1 tablet (20 mg total) by mouth 2 (two) times daily as needed for spasms. 180 tablet 1   Erenumab-aooe (AIMOVIG) 140 MG/ML SOAJ Every month     estradiol (ESTRACE) 0.1 MG/GM vaginal cream Place 1 Applicatorful vaginally at bedtime. Daily for 14 days then twice weekly 42.5 g 0   famotidine (PEPCID) 40 MG tablet Take 1 tablet (40 mg total) by mouth at  bedtime. 30 tablet 5   FLUoxetine (PROZAC) 20 MG capsule Take 2 capsules (40 mg total) by mouth daily. 180 capsule 1   fluticasone (FLONASE) 50 MCG/ACT nasal spray SHAKE LIQUID AND USE 2 SPRAYS IN EACH NOSTRIL DAILY 16 g 6   Fluticasone-Umeclidin-Vilant (TRELEGY ELLIPTA) 100-62.5-25 MCG/ACT AEPB Inhale 1 puff into the lungs daily. 1 each 11   meloxicam (MOBIC) 7.5 MG tablet Take 1 tablet (7.5 mg total) by mouth 2 (two) times daily. 90 tablet 1   montelukast (SINGULAIR) 10 MG tablet TAKE 1 TABLET(10 MG) BY MOUTH AT BEDTIME 90 tablet 2   nystatin-triamcinolone (MYCOLOG II) cream Apply twice a day prn 60 g 0   ondansetron (ZOFRAN) 4 MG tablet TAKE 1 TABLET(4 MG) BY MOUTH EVERY 8 HOURS AS NEEDED FOR NAUSEA OR VOMITING 90 tablet 0   promethazine (PHENERGAN) 25 MG tablet TAKE 1 TABLET BY MOUTH EVERY 8 HOURS IF NEEDED 20 tablet 0   rizatriptan (MAXALT) 10 MG tablet Take 1 tablet (10 mg total) by mouth as needed for migraine. May repeat in 2 hours if needed 10 tablet 0   traZODone (DESYREL) 50 MG tablet Take 1 tablet (50 mg total) by mouth at bedtime as needed for sleep. 30 tablet 3   ALPRAZolam (XANAX) 0.25 MG tablet TAKE 1 TABLET BY MOUTH THREE TIMES DAILY 90 tablet 2   chlorthalidone (HYGROTON) 25 MG tablet TAKE 1 TABLET(25 MG) BY MOUTH EVERY DAY 90 tablet 4   nystatin (MYCOSTATIN) 100000 UNIT/ML suspension Take 5 mLs (500,000 Units total) by mouth 4 (four) times daily. Swish, gargle and spit x 1 week 473 mL 0   pantoprazole (PROTONIX) 40 MG tablet TAKE 1 TABLET(40 MG) BY MOUTH DAILY 90 tablet 0   potassium chloride (MICRO-K) 10 MEQ CR capsule TAKE 2 CAPSULES(20 MEQ) BY MOUTH DAILY 180 capsule 0   No facility-administered medications prior to visit.    Allergies  Allergen Reactions   Erythromycin Base Diarrhea   Penicillins Rash    Review of Systems  Constitutional:  Positive for chills (feels hot and cold all the time). Negative for appetite change, fatigue and fever.  HENT:  Negative for  congestion, ear pain, sinus pressure and sore throat.   Respiratory:  Negative for cough, chest tightness, shortness of breath and wheezing.   Cardiovascular:  Negative for chest pain and palpitations.  Gastrointestinal:  Negative for abdominal pain, constipation, diarrhea, nausea and vomiting.  Genitourinary:  Negative for dysuria and hematuria.  Musculoskeletal:  Negative for arthralgias, back pain, joint swelling and myalgias.  Skin:  Negative for rash.  Neurological:  Negative for dizziness, weakness and headaches.  Psychiatric/Behavioral:  Positive for dysphoric mood. The patient is nervous/anxious.       Objective:    Physical Exam Vitals reviewed.  Constitutional:      Appearance: Normal appearance. She is normal weight.  HENT:     Right Ear: Tympanic membrane, ear canal and external ear normal.     Left Ear: Tympanic membrane, ear canal and external ear normal.     Nose: Nose normal.     Mouth/Throat:     Pharynx: Oropharynx is clear.  Neck:     Vascular: No carotid bruit.  Cardiovascular:     Rate and Rhythm: Normal rate and regular rhythm.     Heart sounds: Normal heart sounds. No murmur heard. Pulmonary:     Effort: Pulmonary effort is normal. No respiratory distress.     Breath sounds: Normal breath sounds.  Neurological:     Mental Status: She is alert and oriented to person, place, and time.  Psychiatric:        Mood and Affect: Mood normal.        Behavior: Behavior normal.    BP (!) 152/70   Pulse 88   Temp (!) 97.5 F (36.4 C) (Temporal)   Ht 5' (1.524 m)   Wt 122 lb (55.3 kg)   SpO2 97%   BMI 23.83 kg/m  Wt Readings from Last 3 Encounters:  05/09/21 122 lb (55.3 kg)  04/20/21 122 lb (55.3 kg)  04/05/21 121 lb 12.8 oz (55.2 kg)    Health Maintenance Due  Topic Date Due   Zoster Vaccines- Shingrix (2 of 2) 06/22/2016   TETANUS/TDAP  05/17/2020    There are no preventive care reminders to display for this patient.   Lab Results  Component  Value Date   TSH 2.810 04/20/2021   Lab Results  Component Value Date   WBC 13.5 (H) 05/09/2021   HGB 12.8 05/09/2021   HCT 37.7 05/09/2021   MCV 90 05/09/2021   PLT 370 05/09/2021   Lab Results  Component Value Date   NA 132 (L) 05/09/2021   K 4.0 05/09/2021   CO2 22 05/09/2021   GLUCOSE 112 (H) 05/09/2021   BUN 15 05/09/2021   CREATININE 0.70 05/09/2021   BILITOT 0.3 05/09/2021   ALKPHOS 66 05/09/2021   AST 19 05/09/2021   ALT 17 05/09/2021   PROT 7.0 05/09/2021   ALBUMIN 4.7 05/09/2021   CALCIUM 10.3 05/09/2021   EGFR 94 05/09/2021   GFR 75.48 07/09/2016   Lab Results  Component Value Date   CHOL 141 04/20/2021   Lab Results  Component Value Date   HDL 55 04/20/2021   Lab Results  Component Value Date   LDLCALC 66 04/20/2021   Lab Results  Component Value Date   TRIG 113 04/20/2021   Lab Results  Component Value Date   CHOLHDL 2.6 04/20/2021   Lab Results  Component Value Date   HGBA1C 6.1 (H) 04/20/2021         Assessment & Plan:   Problem List Items Addressed This Visit       Cardiovascular and Mediastinum   Essential hypertension - Primary    Check bp and  pulse daily.  Keep a log.  Check labs.      Relevant Orders   CBC with Differential/Platelet (Completed)   Comprehensive metabolic panel (Completed)     Other   Diaphoresis    Check labs.       Relevant Orders   Metanephrines, urine, 24 hour (Completed)   5 HIAA, quantitative, urine, 24 hour (Completed)   Flushes    Check bp and pulse daily.  Keep a log.         Meds ordered this encounter  Medications   ketoconazole (NIZORAL) 2 % cream    Sig: Apply 1 application. topically daily.    Dispense:  15 g    Refill:  0   linaclotide (LINZESS) 145 MCG CAPS capsule    Sig: Take 1 capsule (145 mcg total) by mouth daily before breakfast.    Dispense:  8 capsule    Refill:  0   ALPRAZolam (XANAX) 0.25 MG tablet    Sig: TAKE 1 TABLET BY MOUTH THREE TIMES DAILY     Dispense:  90 tablet    Refill:  2   I,Lauren M Auman,acting as a scribe for Rochel Brome, MD.,have documented all relevant documentation on the behalf of Rochel Brome, MD,as directed by  Rochel Brome, MD while in the presence of Rochel Brome, MD.   Follow up: as needed   Rochel Brome, MD

## 2021-05-10 ENCOUNTER — Other Ambulatory Visit: Payer: Self-pay

## 2021-05-10 LAB — CBC WITH DIFFERENTIAL/PLATELET
Basophils Absolute: 0.1 10*3/uL (ref 0.0–0.2)
Basos: 0 %
EOS (ABSOLUTE): 0.1 10*3/uL (ref 0.0–0.4)
Eos: 0 %
Hematocrit: 37.7 % (ref 34.0–46.6)
Hemoglobin: 12.8 g/dL (ref 11.1–15.9)
Immature Grans (Abs): 0.1 10*3/uL (ref 0.0–0.1)
Immature Granulocytes: 1 %
Lymphocytes Absolute: 2.4 10*3/uL (ref 0.7–3.1)
Lymphs: 18 %
MCH: 30.4 pg (ref 26.6–33.0)
MCHC: 34 g/dL (ref 31.5–35.7)
MCV: 90 fL (ref 79–97)
Monocytes Absolute: 1.2 10*3/uL — ABNORMAL HIGH (ref 0.1–0.9)
Monocytes: 9 %
Neutrophils Absolute: 9.8 10*3/uL — ABNORMAL HIGH (ref 1.4–7.0)
Neutrophils: 72 %
Platelets: 370 10*3/uL (ref 150–450)
RBC: 4.21 x10E6/uL (ref 3.77–5.28)
RDW: 13 % (ref 11.7–15.4)
WBC: 13.5 10*3/uL — ABNORMAL HIGH (ref 3.4–10.8)

## 2021-05-10 LAB — COMPREHENSIVE METABOLIC PANEL
ALT: 17 IU/L (ref 0–32)
AST: 19 IU/L (ref 0–40)
Albumin/Globulin Ratio: 2 (ref 1.2–2.2)
Albumin: 4.7 g/dL (ref 3.8–4.8)
Alkaline Phosphatase: 66 IU/L (ref 44–121)
BUN/Creatinine Ratio: 21 (ref 12–28)
BUN: 15 mg/dL (ref 8–27)
Bilirubin Total: 0.3 mg/dL (ref 0.0–1.2)
CO2: 22 mmol/L (ref 20–29)
Calcium: 10.3 mg/dL (ref 8.7–10.3)
Chloride: 89 mmol/L — ABNORMAL LOW (ref 96–106)
Creatinine, Ser: 0.7 mg/dL (ref 0.57–1.00)
Globulin, Total: 2.3 g/dL (ref 1.5–4.5)
Glucose: 112 mg/dL — ABNORMAL HIGH (ref 70–99)
Potassium: 4 mmol/L (ref 3.5–5.2)
Sodium: 132 mmol/L — ABNORMAL LOW (ref 134–144)
Total Protein: 7 g/dL (ref 6.0–8.5)
eGFR: 94 mL/min/{1.73_m2} (ref >59)

## 2021-05-10 MED ORDER — DOXYCYCLINE HYCLATE 100 MG PO TABS: 100.0000 mg | ORAL_TABLET | Freq: Two times a day (BID) | ORAL | 0 refills | Status: DC

## 2021-05-11 ENCOUNTER — Other Ambulatory Visit: Payer: Self-pay

## 2021-05-11 ENCOUNTER — Other Ambulatory Visit: Payer: Medicare PPO

## 2021-05-11 DIAGNOSIS — R61 Generalized hyperhidrosis: Secondary | ICD-10-CM

## 2021-05-11 NOTE — Assessment & Plan Note (Signed)
Repeat Ultrasound in 08/2022. ?

## 2021-05-11 NOTE — Assessment & Plan Note (Signed)
Check tsh. Not currently on any medicines.  ?

## 2021-05-11 NOTE — Assessment & Plan Note (Addendum)
Well controlled.  ?No changes to medicines. Continue amlodipine 2.5 mg daily and chlothalidone 25 mg daily.  ?Continue to work on eating a healthy diet and exercise.  ?Labs drawn today.  ? ?

## 2021-05-11 NOTE — Assessment & Plan Note (Signed)
Recommend continue to work on eating healthy diet and exercise.  

## 2021-05-11 NOTE — Assessment & Plan Note (Signed)
Continue chlorthalidone.  ?

## 2021-05-11 NOTE — Assessment & Plan Note (Signed)
Well controlled.  ?No changes to medicines. Continue lipitor 20 mg before bed.  ?Continue to work on eating a healthy diet and exercise.  ?Labs drawn today.  ? ?

## 2021-05-11 NOTE — Assessment & Plan Note (Signed)
Improved and fairly well controlled on trelegy, singulair. ?

## 2021-05-12 ENCOUNTER — Ambulatory Visit (INDEPENDENT_AMBULATORY_CARE_PROVIDER_SITE_OTHER)
Admission: RE | Admit: 2021-05-12 | Discharge: 2021-05-12 | Disposition: A | Discharge status: Home / Self Care | Payer: Medicare PPO | Source: Ambulatory Visit | Attending: Acute Care | Admitting: Acute Care

## 2021-05-12 DIAGNOSIS — F17210 Nicotine dependence, cigarettes, uncomplicated: Secondary | ICD-10-CM

## 2021-05-12 DIAGNOSIS — Z87891 Personal history of nicotine dependence: Secondary | ICD-10-CM

## 2021-05-15 DIAGNOSIS — M542 Cervicalgia: Secondary | ICD-10-CM | POA: Diagnosis not present

## 2021-05-15 DIAGNOSIS — M5489 Other dorsalgia: Secondary | ICD-10-CM | POA: Diagnosis not present

## 2021-05-15 LAB — 5 HIAA, QUANTITATIVE, URINE, 24 HOUR
5-HIAA, Ur: 1.9 mg/L
5-HIAA,Quant.,24 Hr Urine: 2.5 mg/24 hr (ref 0.0–14.9)

## 2021-05-16 ENCOUNTER — Telehealth: Payer: Self-pay | Admitting: Acute Care

## 2021-05-16 NOTE — Telephone Encounter (Signed)
Call report going to Sarah :  ? ?CLINICAL DATA:  71 year old asymptomatic female current smoker with ?69 pack-year smoking history. ?  ?EXAM: ?CT CHEST WITHOUT CONTRAST LOW-DOSE FOR LUNG CANCER SCREENING ?  ?TECHNIQUE: ?Multidetector CT imaging of the chest was performed following the ?standard protocol without IV contrast. ?  ?RADIATION DOSE REDUCTION: This exam was performed according to the ?departmental dose-optimization program which includes automated ?exposure control, adjustment of the mA and/or kV according to ?patient size and/or use of iterative reconstruction technique. ?  ?COMPARISON:  05/05/2020 screening chest CT. ?  ?FINDINGS: ?Cardiovascular: Normal heart size. No significant pericardial ?effusion/thickening. Three-vessel coronary atherosclerosis. ?Atherosclerotic nonaneurysmal thoracic aorta. Normal caliber ?pulmonary arteries. ?  ?Mediastinum/Nodes: No discrete thyroid nodules. Unremarkable ?esophagus. No pathologically enlarged axillary, mediastinal or hilar ?lymph nodes, noting limited sensitivity for the detection of hilar ?adenopathy on this noncontrast study. ?  ?Lungs/Pleura: No pneumothorax. No pleural effusion. Severe ?centrilobular emphysema with diffuse bronchial wall thickening. New ?irregular bandlike focus of consolidation in the apical right upper ?lobe measuring 14.4 mm in volume derived mean diameter (series ?3/image 36). No significant growth of previously visualized ?pulmonary nodules. New solid right middle lobe pulmonary nodule ?measuring 4.2 mm in volume derived mean diameter (series 3/image ?181). ?  ?Upper abdomen: Cholecystectomy. Stable dystrophic right liver dome ?calcification. ?  ?Musculoskeletal: No aggressive appearing focal osseous lesions. Mild ?thoracic spondylosis. ?  ?IMPRESSION: ?1. Lung-RADS 4B, suspicious. Additional imaging evaluation or ?consultation with Pulmonology or Thoracic Surgery recommended. New ?irregular 14.4 mm bandlike focus of consolidation in  the apical ?right upper lobe, nonspecific, favoring evolving ?postinfectious/postinflammatory scarring. New 4.2 mm solid right ?middle lobe pulmonary nodule. Follow up low-dose chest CT without ?contrast in 3 months (please use the following order, "CT CHEST LCS ?NODULE FOLLOW-UP W/O CM") is recommended. ?2. Three-vessel coronary atherosclerosis. ?3. Aortic Atherosclerosis (ICD10-I70.0) and Emphysema (ICD10-J43.9). ?  ?These results will be called to the ordering clinician or ?representative by the Radiologist Assistant, and communication ?documented in the PACS or [employer]. ?  ?  ?Electronically Signed ?  By: Ilona Sorrel M.D. ?  On: 05/16/2021 10:29 ? ?Please advise. Thank you  ?

## 2021-05-17 ENCOUNTER — Telehealth: Payer: Self-pay | Admitting: Acute Care

## 2021-05-17 DIAGNOSIS — F17210 Nicotine dependence, cigarettes, uncomplicated: Secondary | ICD-10-CM

## 2021-05-17 DIAGNOSIS — R911 Solitary pulmonary nodule: Secondary | ICD-10-CM

## 2021-05-17 DIAGNOSIS — Z87891 Personal history of nicotine dependence: Secondary | ICD-10-CM

## 2021-05-17 LAB — METANEPHRINES, URINE, 24 HOUR
Metaneph Total, Ur: 35 ug/L
Metanephrines, 24H Ur: 46 ug/24 hr (ref 36–209)
Normetanephrine, 24H Ur: 107 ug/24 hr — ABNORMAL LOW (ref 131–612)
Normetanephrine, Ur: 82 ug/L

## 2021-05-17 NOTE — Telephone Encounter (Deleted)
I made sure Dr. Verlee Monte was aware.  ?

## 2021-05-17 NOTE — Telephone Encounter (Signed)
CT results faxed to PCP with f/u plans included. Order placed for 3 mth nodule f/u Chest Ct.  ?

## 2021-05-17 NOTE — Telephone Encounter (Addendum)
I have attempted to call the patient with the results of their  Low Dose CT Chest Lung cancer screening scan. There was no answer. I have left a VM requesting the patient call the office for the scan results. I included the office contact information in the message. We will await patient's return call. If no return call we will continue to call until patient is contacted.   ? ?This is a 4B that looks post infectious. She will need a 3 month follow up to re-evaluate.  ? ?This is Dr. Glenetta Hew patient. I secure chatted him to make him aware.  ?

## 2021-05-17 NOTE — Telephone Encounter (Addendum)
I have called the patient with her scan results. We discussed that her scan was read as a Lung RADS  4B. However, radiology feels this is an area of evolving post infectious / post inflammatory scarring. Pt. States she has been sick with an upper respiratory issue and she is being treated with antibiotics for this.  ?Plan is for a 3 month follow up to re-evaluate the area of concern. I have told her to call the office if she is not better after her treatment with antibiotics for an appointment with Dr. Verlee Monte or a Nurse Practitioner. Thanks so much. ?Langley Gauss, please order 3 month follow up LDCT and fax results to PCP, let them know plan is for a 3 month follow up. Thanks so much ? ?We did discuss that imaging noted severe centrilobular emphysema, and we discussed working on total smoking cessation. She verbalized understanding. ?

## 2021-05-18 ENCOUNTER — Other Ambulatory Visit: Payer: Self-pay

## 2021-05-18 MED ORDER — PANTOPRAZOLE SODIUM 40 MG PO TBEC: DELAYED_RELEASE_TABLET | ORAL | 0 refills | Status: DC

## 2021-05-18 NOTE — Telephone Encounter (Signed)
Agree with this plan. Sagittal and coronal views make this seem more likely scar/inflammatory.

## 2021-05-25 ENCOUNTER — Other Ambulatory Visit: Payer: Self-pay | Admitting: Legal Medicine

## 2021-05-25 ENCOUNTER — Telehealth: Payer: Self-pay

## 2021-05-25 MED ORDER — DOXYCYCLINE HYCLATE 100 MG PO TABS
100.0000 mg | ORAL_TABLET | Freq: Two times a day (BID) | ORAL | 0 refills | Status: DC
Start: 2021-05-25 — End: 2021-06-12

## 2021-05-25 NOTE — Telephone Encounter (Signed)
Noted  

## 2021-05-25 NOTE — Telephone Encounter (Signed)
Patient is requesting further antibiotics. Patient completed 10 day course of doxycycline around 5 days ago. Patient's symptoms have returned. Symptoms include hoarseness, ST, drainage, and cough. She denies fever. Please advise.   [name], [place] 05/25/21 3:06 PM

## 2021-05-25 NOTE — Telephone Encounter (Signed)
I sent in refill

## 2021-05-28 DIAGNOSIS — R232 Flushing: Secondary | ICD-10-CM | POA: Insufficient documentation

## 2021-05-28 DIAGNOSIS — R61 Generalized hyperhidrosis: Secondary | ICD-10-CM | POA: Insufficient documentation

## 2021-05-28 NOTE — Assessment & Plan Note (Signed)
Check bp and pulse daily.  Keep a log.

## 2021-05-28 NOTE — Assessment & Plan Note (Addendum)
Check bp and pulse daily.  Keep a log.  Check labs.

## 2021-05-28 NOTE — Assessment & Plan Note (Signed)
Check labs 

## 2021-05-30 ENCOUNTER — Other Ambulatory Visit: Payer: Self-pay | Admitting: Family Medicine

## 2021-06-01 ENCOUNTER — Other Ambulatory Visit: Payer: Self-pay | Admitting: Family Medicine

## 2021-06-05 DIAGNOSIS — M542 Cervicalgia: Secondary | ICD-10-CM | POA: Diagnosis not present

## 2021-06-05 DIAGNOSIS — M5489 Other dorsalgia: Secondary | ICD-10-CM | POA: Diagnosis not present

## 2021-06-06 NOTE — Progress Notes (Unsigned)
Synopsis: Referred for COPD exacerbation by Rochel Brome, MD  Subjective:   PATIENT ID: Allison Jimenez: female DOB: February 03, 1950, MRN: [id number]  No chief complaint on file.  70yF with history of COPD/emphysema, 22 py smoking including ecigs, meniere's, referred by Jerrell Belfast following [facility] 11/01/20.  She has been prescribed trelegy - but she is not using it. Doesn't feel like she needs it.   Some DOE when she goes up long incline. She doesn't necessarily think it's worse than other patients her age without lung disease. She does have cough but not especially bothersome.   She says she thinks she has allergic rhinitis   She is currently enrolled in our lung cancer screening program. Vapes with some sort of ecig but unsure what is in liquid - she doesn't think it contains nicotine.   Interval HPI Seen by Dr. Neldon Mc since last visit - working on LPR, [place]  Otherwise pertinent review of systems is negative.  Past Medical History:  Diagnosis Date   Acute blood loss anemia (ABLA) 09/05/2020   Allergy    ANXIETY 06/05/2006   Arthritis    SHOULDERS    BACK PAIN 01/05/2010   Cataract    bilateral, left worse   Centrilobular emphysema (HCC)    Centrilobular emphysema (HCC)    COPD (chronic obstructive pulmonary disease) ([place])    DIVERTICULOSIS, COLON 06/05/2006   DIZZINESS OR VERTIGO 06/05/2006   positional   Hearing loss in left ear    HYPERLIPIDEMIA 06/05/2006   HYPOKALEMIA 12/14/2008   MENIERE'S DISEASE 06/25/2006   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   Qualifier: Diagnosis of  By: Burnice Logan  MD, Doretha Sou    Migraine    TOBACCO USER 11/25/2007     Family History  Problem Relation Age of Onset   Breast cancer Mother    Migraines Other    Depression Other    Anxiety disorder Other    High Cholesterol Other    Colon cancer Neg Hx    Rectal cancer Neg Hx    Stomach cancer Neg Hx    Colon polyps Neg Hx    Esophageal cancer Neg Hx      Past Surgical History:  Procedure  Laterality Date   APPENDECTOMY     CESAREAN SECTION     x1   CHOLECYSTECTOMY     COLONOSCOPY     COSMETIC SURGERY     mastoid surgery  1992   shunt in mastoid- endolymphatic sac in left ear    TUBAL LIGATION     vocal cord surgery     nodule x2    Social History   Socioeconomic History   Marital status: Divorced    Spouse name: Not on file   Number of children: 1   Years of education: 13   Highest education level: Not on file  Occupational History    Employer: [employer]    Comment: Retired  Tobacco Use   Smoking status: Every Day    Packs/day: 1.50    Years: 49.00    Pack years: 73.50    Types: Cigarettes   Smokeless tobacco: Never   Tobacco comments:    Currently 1/2 ppd, uses vape   Vaping Use   Vaping Use: Some days   Start date: 01/01/2010   Substances: Nicotine   Devices: menthol/fruit flavor  Substance and Sexual Activity   Alcohol use: No    Alcohol/week: 0.0 standard drinks   Drug use: No   Sexual activity: Not  on file  Other Topics Concern   Not on file  Social History Narrative   Patient lives at home alone. Patient has a Data processing manager. Patient is divorced .   Patient is retired.   Education high school.   Right handed.   Caffeine none.   Social Determinants of Health   Financial Resource Strain: Not on file  Food Insecurity: Not on file  Transportation Needs: Not on file  Physical Activity: Not on file  Stress: Not on file  Social Connections: Not on file  Intimate Partner Violence: Not on file     Allergies  Allergen Reactions   Erythromycin Base Diarrhea   Penicillins Rash     Outpatient Medications Prior to Visit  Medication Sig Dispense Refill   albuterol (VENTOLIN HFA) 108 (90 Base) MCG/ACT inhaler Inhale into the lungs.     ALPRAZolam (XANAX) 0.25 MG tablet TAKE 1 TABLET BY MOUTH THREE TIMES DAILY 90 tablet 2   amLODipine (NORVASC) 2.5 MG tablet TAKE 1 TABLET(2.5 MG) BY MOUTH DAILY 90 tablet 0    atorvastatin (LIPITOR) 20 MG tablet TAKE 1 TABLET(20 MG) BY MOUTH DAILY 90 tablet 1   chlorthalidone (HYGROTON) 25 MG tablet TAKE 1 TABLET(25 MG) BY MOUTH EVERY DAY 90 tablet 4   cyclobenzaprine (FLEXERIL) 10 MG tablet Take by mouth.     diclofenac Sodium (VOLTAREN) 1 % GEL diclofenac 1 % topical gel 100 g 0   dicyclomine (BENTYL) 20 MG tablet Take 1 tablet (20 mg total) by mouth 2 (two) times daily as needed for spasms. 180 tablet 1   doxycycline (VIBRA-TABS) 100 MG tablet Take 1 tablet (100 mg total) by mouth 2 (two) times daily. 20 tablet 0   Erenumab-aooe (AIMOVIG) 140 MG/ML SOAJ Every month     estradiol (ESTRACE) 0.1 MG/GM vaginal cream Place 1 Applicatorful vaginally at bedtime. Daily for 14 days then twice weekly 42.5 g 0   famotidine (PEPCID) 40 MG tablet Take 1 tablet (40 mg total) by mouth at bedtime. 30 tablet 5   FLUoxetine (PROZAC) 20 MG capsule Take 2 capsules (40 mg total) by mouth daily. 180 capsule 1   fluticasone (FLONASE) 50 MCG/ACT nasal spray SHAKE LIQUID AND USE 2 SPRAYS IN EACH NOSTRIL DAILY 16 g 6   Fluticasone-Umeclidin-Vilant (TRELEGY ELLIPTA) 100-62.5-25 MCG/ACT AEPB Inhale 1 puff into the lungs daily. 1 each 11   ketoconazole (NIZORAL) 2 % cream Apply 1 application. topically daily. 15 g 0   linaclotide (LINZESS) 145 MCG CAPS capsule Take 1 capsule (145 mcg total) by mouth daily before breakfast. 8 capsule 0   meloxicam (MOBIC) 7.5 MG tablet Take 1 tablet (7.5 mg total) by mouth 2 (two) times daily. 90 tablet 1   montelukast (SINGULAIR) 10 MG tablet TAKE 1 TABLET(10 MG) BY MOUTH AT BEDTIME 90 tablet 2   nystatin (MYCOSTATIN) 100000 UNIT/ML suspension SWISH, GARGLE AND SPIT 5 MILLILITERS BY MOUTH 4 TIMES A DAY FOR 1 WEEK 473 mL 0   nystatin-triamcinolone (MYCOLOG II) cream Apply twice a day prn 60 g 0   ondansetron (ZOFRAN) 4 MG tablet TAKE 1 TABLET(4 MG) BY MOUTH EVERY 8 HOURS AS NEEDED FOR NAUSEA OR VOMITING 90 tablet 0   pantoprazole (PROTONIX) 40 MG tablet TAKE 1  TABLET(40 MG) BY MOUTH DAILY 90 tablet 0   potassium chloride (MICRO-K) 10 MEQ CR capsule TAKE 2 CAPSULES(20 MEQ) BY MOUTH DAILY 180 capsule 0   promethazine (PHENERGAN) 25 MG tablet TAKE 1 TABLET BY MOUTH EVERY 8 HOURS  IF NEEDED 20 tablet 0   rizatriptan (MAXALT) 10 MG tablet Take 1 tablet (10 mg total) by mouth as needed for migraine. May repeat in 2 hours if needed 10 tablet 0   traZODone (DESYREL) 50 MG tablet Take 1 tablet (50 mg total) by mouth at bedtime as needed for sleep. 30 tablet 3   No facility-administered medications prior to visit.       Objective:   Physical Exam:  General appearance: 71 y.o., female, NAD, conversant  Eyes: anicteric sclerae; PERRL, tracking appropriately HENT: NCAT; erythema over posterior OP but no plaques  Neck: Trachea midline; no lymphadenopathy, no JVD Lungs: CTAB, no crackles, no wheeze, with normal respiratory effort CV: RRR, no murmur  Abdomen: Soft, non-tender; non-distended, BS present  Extremities: No peripheral edema, warm Skin: Normal turgor and texture; no rash Psych: Appropriate affect Neuro: Alert and oriented to person and place, no focal deficit     There were no vitals filed for this visit.     on RA BMI Readings from Last 3 Encounters:  05/09/21 23.83 kg/m  04/20/21 23.83 kg/m  04/05/21 23.79 kg/m   Wt Readings from Last 3 Encounters:  05/09/21 122 lb (55.3 kg)  04/20/21 122 lb (55.3 kg)  04/05/21 121 lb 12.8 oz (55.2 kg)     CBC    Component Value Date/Time   WBC 13.5 (H) 05/09/2021 1553   WBC 9.2 07/14/2020 1145   RBC 4.21 05/09/2021 1553   RBC 4.20 07/14/2020 1145   HGB 12.8 05/09/2021 1553   HCT 37.7 05/09/2021 1553   PLT 370 05/09/2021 1553   MCV 90 05/09/2021 1553   MCH 30.4 05/09/2021 1553   MCH 28.6 07/14/2020 1145   MCHC 34.0 05/09/2021 1553   MCHC 33.6 07/14/2020 1145   RDW 13.0 05/09/2021 1553   LYMPHSABS 2.4 05/09/2021 1553   MONOABS 0.7 07/14/2020 1145   EOSABS 0.1 05/09/2021 1553    BASOSABS 0.1 05/09/2021 1553    Eos 100-300  Chest Imaging: CT 05/06/20 reviewed by me with emphysema, small <31m nodules  CT 05/2021 reviewed by me new nodular focus of likely scar 1.570mapiral RUL, new solid RML 4.53m38module  Pulmonary Functions Testing Results:    Latest Ref Rng & Units 03/21/2021   12:57 PM  PFT Results  FVC-Pre L 2.47    FVC-Predicted Pre % 94    FVC-Post L 2.56    FVC-Predicted Post % 97    Pre FEV1/FVC % % 61    Post FEV1/FCV % % 60    FEV1-Pre L 1.52    FEV1-Predicted Pre % 76    FEV1-Post L 1.55    DLCO uncorrected ml/min/mmHg 10.29    DLCO UNC% % 59    DLCO corrected ml/min/mmHg 10.29    DLCO COR %Predicted % 59    DLVA Predicted % 56    TLC L 6.32    TLC % Predicted % 139    RV % Predicted % 197    Reviewed by me with Mild obstruction, hyperinflation, air trapping, moderately reduced diffusing capacity  Echocardiogram:   TTE 2021 impaired relaxation     Assessment & Plan:   # COPD gold functional group E: # Emphysema  # Rhinitis # Postnasal drainage  # Smoking:  # Pulmonary nodules  Plan: - would continue trelegy 100 mcg 1 puff once daily, rinse mouth after use, 2 samples given - albuterol 1-2 puffs as needed rescue inhaler - take shower clear nose of crusting  then flonase 1 spray each nostril, continue zyrtec 10 mg daily for rhinitis, postnasal drainage  - we again had discussion regarding benefits of smoking cessation including decreased risk of lung cancer death twice as effective as lung cancer screening alone, decreased risk of emphysema progression, and variety of pharmacotherapies to support cessation. Will still try to gradually cut back on her own - nodule surveillance due 08/16/21   RTC 3 months  [name], MD [facility] 06/06/2021 4:44 PM

## 2021-06-07 ENCOUNTER — Encounter: Payer: Self-pay | Admitting: Student

## 2021-06-07 ENCOUNTER — Ambulatory Visit: Payer: Medicare PPO | Admitting: Student

## 2021-06-07 VITALS — BP 128/66 | HR 89 | Temp 97.9°F | Ht 60.0 in | Wt 123.4 lb

## 2021-06-07 DIAGNOSIS — J449 Chronic obstructive pulmonary disease, unspecified: Secondary | ICD-10-CM

## 2021-06-07 NOTE — Patient Instructions (Signed)
-   would continue trelegy 100 mcg 1 puff once daily, rinse mouth after use - albuterol 1-2 puffs as needed rescue inhaler - take shower clear nose of crusting then flonase 1 spray each nostril, continue zyrtec 10 mg daily for rhinitis, postnasal drainage - try the patches and nicotine lozenge - you'll be called to schedule your ct chest - see you in Late August or sooner if needed!

## 2021-06-08 DIAGNOSIS — M7062 Trochanteric bursitis, left hip: Secondary | ICD-10-CM | POA: Diagnosis not present

## 2021-06-08 DIAGNOSIS — M549 Dorsalgia, unspecified: Secondary | ICD-10-CM | POA: Diagnosis not present

## 2021-06-12 ENCOUNTER — Encounter: Payer: Self-pay | Admitting: Family Medicine

## 2021-06-12 ENCOUNTER — Ambulatory Visit (INDEPENDENT_AMBULATORY_CARE_PROVIDER_SITE_OTHER): Payer: Medicare PPO | Admitting: Family Medicine

## 2021-06-12 VITALS — BP 130/80 | HR 88 | Temp 97.3°F | Resp 18 | Ht 60.0 in | Wt 121.4 lb

## 2021-06-12 DIAGNOSIS — J0181 Other acute recurrent sinusitis: Secondary | ICD-10-CM | POA: Diagnosis not present

## 2021-06-12 MED ORDER — ATORVASTATIN CALCIUM 20 MG PO TABS: ORAL_TABLET | ORAL | 1 refills | Status: DC

## 2021-06-12 MED ORDER — CEFDINIR 300 MG PO CAPS: 300.0000 mg | ORAL_CAPSULE | Freq: Two times a day (BID) | ORAL | 0 refills | Status: DC

## 2021-06-12 NOTE — Progress Notes (Signed)
Acute Office Visit  Subjective:    Patient ID: Allison Jimenez, female    DOB: 03-Nov-1950, 71 y.o.   MRN: [id number]  Chief Complaint  Patient presents with   Sore Throat   Hoarse    HPI: Patient is in today for hoarseness and sore throat since Friday. Lost voice Friday night. No fever. Has nasal congestion. She was seen by Dr. Lucie Leather last week and he suggested that she take the pepcid regularly as directed for hoarseness. Gargling warm salt water. Took doxycycline x 2 courses.   COPD: Sees Dr Thora Lance.  Past Medical History:  Diagnosis Date   Acute blood loss anemia (ABLA) 09/05/2020   Allergy    ANXIETY 06/05/2006   Arthritis    SHOULDERS    BACK PAIN 01/05/2010   Cataract    bilateral, left worse   Centrilobular emphysema (HCC)    Centrilobular emphysema (HCC)    COPD (chronic obstructive pulmonary disease) (HCC)    DIVERTICULOSIS, COLON 06/05/2006   DIZZINESS OR VERTIGO 06/05/2006   positional   Hearing loss in left ear    HYPERLIPIDEMIA 06/05/2006   HYPOKALEMIA 12/14/2008   MENIERE'S DISEASE 06/25/2006   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   Qualifier: Diagnosis of  By: Amador Cunas  MD, Janett Labella    Migraine    TOBACCO USER 11/25/2007    Past Surgical History:  Procedure Laterality Date   APPENDECTOMY     CESAREAN SECTION     x1   CHOLECYSTECTOMY     COLONOSCOPY     COSMETIC SURGERY     mastoid surgery  1992   shunt in mastoid- endolymphatic sac in left ear    TUBAL LIGATION     vocal cord surgery     nodule x2    Family History  Problem Relation Age of Onset   Breast cancer Mother    Migraines Other    Depression Other    Anxiety disorder Other    High Cholesterol Other    Colon cancer Neg Hx    Rectal cancer Neg Hx    Stomach cancer Neg Hx    Colon polyps Neg Hx    Esophageal cancer Neg Hx     Social History   Socioeconomic History   Marital status: Divorced    Spouse name: Not on file   Number of children: 1   Years of education: 13   Highest education  level: Not on file  Occupational History    Employer: [employer]    Comment: Retired  Tobacco Use   Smoking status: Every Day    Packs/day: 1.50    Years: 49.00    Total pack years: 73.50    Types: Cigarettes   Smokeless tobacco: Never   Tobacco comments:    Currently 1/2 ppd, uses vape   Vaping Use   Vaping Use: Some days   Start date: 01/01/2010   Substances: Nicotine   Devices: menthol/fruit flavor  Substance and Sexual Activity   Alcohol use: No    Alcohol/week: 0.0 standard drinks of alcohol   Drug use: No   Sexual activity: Not on file  Other Topics Concern   Not on file  Social History Narrative   Patient lives at home alone. Patient has a Biochemist, clinical. Patient is divorced .   Patient is retired.   Education high school.   Right handed.   Caffeine none.   Social Determinants of Health   Financial Resource Strain: Not on file  Food Insecurity: Not on file  Transportation Needs: Not on file  Physical Activity: Not on file  Stress: Not on file  Social Connections: Not on file  Intimate Partner Violence: Not on file    Outpatient Medications Prior to Visit  Medication Sig Dispense Refill   albuterol (VENTOLIN HFA) 108 (90 Base) MCG/ACT inhaler Inhale into the lungs.     ALPRAZolam (XANAX) 0.25 MG tablet TAKE 1 TABLET BY MOUTH THREE TIMES DAILY 90 tablet 2   amLODipine (NORVASC) 2.5 MG tablet TAKE 1 TABLET(2.5 MG) BY MOUTH DAILY 90 tablet 0   chlorthalidone (HYGROTON) 25 MG tablet TAKE 1 TABLET(25 MG) BY MOUTH EVERY DAY 90 tablet 4   cyclobenzaprine (FLEXERIL) 10 MG tablet Take by mouth.     diclofenac Sodium (VOLTAREN) 1 % GEL diclofenac 1 % topical gel 100 g 0   dicyclomine (BENTYL) 20 MG tablet Take 1 tablet (20 mg total) by mouth 2 (two) times daily as needed for spasms. 180 tablet 1   Erenumab-aooe (AIMOVIG) 140 MG/ML SOAJ Every month     estradiol (ESTRACE) 0.1 MG/GM vaginal cream Place 1 Applicatorful vaginally at bedtime. Daily for  14 days then twice weekly 42.5 g 0   famotidine (PEPCID) 40 MG tablet Take 1 tablet (40 mg total) by mouth at bedtime. 30 tablet 5   FLUoxetine (PROZAC) 20 MG capsule Take 2 capsules (40 mg total) by mouth daily. 180 capsule 1   fluticasone (FLONASE) 50 MCG/ACT nasal spray SHAKE LIQUID AND USE 2 SPRAYS IN EACH NOSTRIL DAILY 16 g 6   Fluticasone-Umeclidin-Vilant (TRELEGY ELLIPTA) 100-62.5-25 MCG/ACT AEPB Inhale 1 puff into the lungs daily. 1 each 11   ketoconazole (NIZORAL) 2 % cream Apply 1 application. topically daily. 15 g 0   linaclotide (LINZESS) 145 MCG CAPS capsule Take 1 capsule (145 mcg total) by mouth daily before breakfast. 8 capsule 0   meloxicam (MOBIC) 7.5 MG tablet Take 1 tablet (7.5 mg total) by mouth 2 (two) times daily. 90 tablet 1   montelukast (SINGULAIR) 10 MG tablet TAKE 1 TABLET(10 MG) BY MOUTH AT BEDTIME 90 tablet 2   nystatin (MYCOSTATIN) 100000 UNIT/ML suspension SWISH, GARGLE AND SPIT 5 MILLILITERS BY MOUTH 4 TIMES A DAY FOR 1 WEEK 473 mL 0   nystatin-triamcinolone (MYCOLOG II) cream Apply twice a day prn 60 g 0   ondansetron (ZOFRAN) 4 MG tablet TAKE 1 TABLET(4 MG) BY MOUTH EVERY 8 HOURS AS NEEDED FOR NAUSEA OR VOMITING 90 tablet 0   pantoprazole (PROTONIX) 40 MG tablet TAKE 1 TABLET(40 MG) BY MOUTH DAILY 90 tablet 0   potassium chloride (MICRO-K) 10 MEQ CR capsule TAKE 2 CAPSULES(20 MEQ) BY MOUTH DAILY 180 capsule 0   promethazine (PHENERGAN) 25 MG tablet TAKE 1 TABLET BY MOUTH EVERY 8 HOURS IF NEEDED 20 tablet 0   rizatriptan (MAXALT) 10 MG tablet Take 1 tablet (10 mg total) by mouth as needed for migraine. May repeat in 2 hours if needed 10 tablet 0   traZODone (DESYREL) 50 MG tablet Take 1 tablet (50 mg total) by mouth at bedtime as needed for sleep. 30 tablet 3   atorvastatin (LIPITOR) 20 MG tablet TAKE 1 TABLET(20 MG) BY MOUTH DAILY 90 tablet 1   doxycycline (VIBRA-TABS) 100 MG tablet Take 1 tablet (100 mg total) by mouth 2 (two) times daily. 20 tablet 0   No  facility-administered medications prior to visit.    Allergies  Allergen Reactions   Erythromycin Base Diarrhea   Penicillins Rash  Review of Systems  Constitutional:  Negative for chills, fatigue and fever.  HENT:  Positive for congestion, sore throat and voice change (hoarseness.). Negative for ear pain.   Respiratory:  Negative for cough and shortness of breath.   Cardiovascular:  Negative for chest pain.       Objective:    Physical Exam Vitals reviewed.  Constitutional:      Appearance: Normal appearance. She is well-developed.  HENT:     Right Ear: Tympanic membrane, ear canal and external ear normal.     Left Ear: Tympanic membrane, ear canal and external ear normal.     Nose: Congestion present.     Comments: Sinus tenderness.    Mouth/Throat:     Pharynx: Oropharynx is clear. Posterior oropharyngeal erythema (mild) present. No oropharyngeal exudate.  Cardiovascular:     Rate and Rhythm: Normal rate and regular rhythm.     Heart sounds: Normal heart sounds. No murmur heard. Pulmonary:     Effort: Pulmonary effort is normal. No respiratory distress.     Breath sounds: Normal breath sounds.  Lymphadenopathy:     Cervical: No cervical adenopathy.  Neurological:     Mental Status: She is alert and oriented to person, place, and time.  Psychiatric:        Mood and Affect: Mood normal.        Behavior: Behavior normal.     BP 130/80 Comment: at home.  Pulse 88   Temp (!) 97.3 F (36.3 C)   Resp 18   Ht 5' (1.524 m)   Wt 121 lb 6.4 oz (55.1 kg)   BMI 23.71 kg/m  Wt Readings from Last 3 Encounters:  06/12/21 121 lb 6.4 oz (55.1 kg)  06/07/21 123 lb 6.4 oz (56 kg)  05/09/21 122 lb (55.3 kg)    Health Maintenance Due  Topic Date Due   Zoster Vaccines- Shingrix (2 of 2) 06/22/2016   TETANUS/TDAP  05/17/2020    There are no preventive care reminders to display for this patient.   Lab Results  Component Value Date   TSH 2.810 04/20/2021   Lab  Results  Component Value Date   WBC 13.5 (H) 05/09/2021   HGB 12.8 05/09/2021   HCT 37.7 05/09/2021   MCV 90 05/09/2021   PLT 370 05/09/2021   Lab Results  Component Value Date   NA 132 (L) 05/09/2021   K 4.0 05/09/2021   CO2 22 05/09/2021   GLUCOSE 112 (H) 05/09/2021   BUN 15 05/09/2021   CREATININE 0.70 05/09/2021   BILITOT 0.3 05/09/2021   ALKPHOS 66 05/09/2021   AST 19 05/09/2021   ALT 17 05/09/2021   PROT 7.0 05/09/2021   ALBUMIN 4.7 05/09/2021   CALCIUM 10.3 05/09/2021   EGFR 94 05/09/2021   GFR 75.48 07/09/2016   Lab Results  Component Value Date   CHOL 141 04/20/2021   Lab Results  Component Value Date   HDL 55 04/20/2021   Lab Results  Component Value Date   LDLCALC 66 04/20/2021   Lab Results  Component Value Date   TRIG 113 04/20/2021   Lab Results  Component Value Date   CHOLHDL 2.6 04/20/2021   Lab Results  Component Value Date   HGBA1C 6.1 (H) 04/20/2021       Assessment & Plan:   Problem List Items Addressed This Visit       Respiratory   Other acute recurrent sinusitis - Primary    Cefdinir rx.  Relevant Medications   cefdinir (OMNICEF) 300 MG capsule   Meds ordered this encounter  Medications   cefdinir (OMNICEF) 300 MG capsule    Sig: Take 1 capsule (300 mg total) by mouth 2 (two) times daily.    Dispense:  20 capsule    Refill:  0   atorvastatin (LIPITOR) 20 MG tablet    Sig: TAKE 1 TABLET(20 MG) BY MOUTH DAILY    Dispense:  90 tablet    Refill:  1    Follow-up: Return if symptoms worsen or fail to improve.  An After Visit Summary was printed and given to the patient.  Rochel Brome, MD [facility] [phone]

## 2021-06-17 DIAGNOSIS — J0181 Other acute recurrent sinusitis: Secondary | ICD-10-CM | POA: Insufficient documentation

## 2021-06-17 NOTE — Assessment & Plan Note (Signed)
Cefdinir rx.

## 2021-06-21 ENCOUNTER — Ambulatory Visit: Payer: Medicare PPO | Admitting: Student

## 2021-06-26 ENCOUNTER — Other Ambulatory Visit: Payer: Self-pay

## 2021-06-26 MED ORDER — NYSTATIN 100000 UNIT/ML MT SUSP: OROMUCOSAL | 0 refills | Status: DC

## 2021-06-29 ENCOUNTER — Telehealth: Payer: Self-pay

## 2021-06-29 ENCOUNTER — Ambulatory Visit (INDEPENDENT_AMBULATORY_CARE_PROVIDER_SITE_OTHER): Payer: Medicare PPO

## 2021-06-29 NOTE — Chronic Care Management (AMB) (Signed)
Chronic Care Management   CCM RN Visit Note  06/29/2021 Name: Allison Jimenez MRN: [id number] DOB: 11-06-1950  Subjective: Allison Jimenez is a 71 y.o. year old female who is a primary care patient of Cox, Kirsten, MD. The care management team was consulted for assistance with disease management and care coordination needs.    Engaged with patient by telephone for follow up visit in response to provider referral for case management and/or care coordination services.   Consent to Services:  The patient was given information about Chronic Care Management services, agreed to services, and gave verbal consent prior to initiation of services.  Please see initial visit note for detailed documentation.   Patient agreed to services and verbal consent obtained.   Assessment: Review of patient past medical history, allergies, medications, health status, including review of consultants reports, laboratory and other test data, was performed as part of comprehensive evaluation and provision of chronic care management services.   SDOH (Social Determinants of Health) assessments and interventions performed:    CCM Care Plan  Allergies  Allergen Reactions   Erythromycin Base Diarrhea   Penicillins Rash    Outpatient Encounter Medications as of 06/29/2021  Medication Sig   albuterol (VENTOLIN HFA) 108 (90 Base) MCG/ACT inhaler Inhale into the lungs.   ALPRAZolam (XANAX) 0.25 MG tablet TAKE 1 TABLET BY MOUTH THREE TIMES DAILY   amLODipine (NORVASC) 2.5 MG tablet TAKE 1 TABLET(2.5 MG) BY MOUTH DAILY   atorvastatin (LIPITOR) 20 MG tablet TAKE 1 TABLET(20 MG) BY MOUTH DAILY   cefdinir (OMNICEF) 300 MG capsule Take 1 capsule (300 mg total) by mouth 2 (two) times daily. (Patient not taking: Reported on 06/29/2021)   chlorthalidone (HYGROTON) 25 MG tablet TAKE 1 TABLET(25 MG) BY MOUTH EVERY DAY   cyclobenzaprine (FLEXERIL) 10 MG tablet Take by mouth.   diclofenac Sodium (VOLTAREN) 1 % GEL diclofenac 1 %  topical gel   dicyclomine (BENTYL) 20 MG tablet Take 1 tablet (20 mg total) by mouth 2 (two) times daily as needed for spasms.   Erenumab-aooe (AIMOVIG) 140 MG/ML SOAJ Every month   estradiol (ESTRACE) 0.1 MG/GM vaginal cream Place 1 Applicatorful vaginally at bedtime. Daily for 14 days then twice weekly   famotidine (PEPCID) 40 MG tablet Take 1 tablet (40 mg total) by mouth at bedtime.   FLUoxetine (PROZAC) 20 MG capsule Take 2 capsules (40 mg total) by mouth daily.   fluticasone (FLONASE) 50 MCG/ACT nasal spray SHAKE LIQUID AND USE 2 SPRAYS IN EACH NOSTRIL DAILY   Fluticasone-Umeclidin-Vilant (TRELEGY ELLIPTA) 100-62.5-25 MCG/ACT AEPB Inhale 1 puff into the lungs daily.   ketoconazole (NIZORAL) 2 % cream Apply 1 application. topically daily.   linaclotide (LINZESS) 145 MCG CAPS capsule Take 1 capsule (145 mcg total) by mouth daily before breakfast.   meloxicam (MOBIC) 7.5 MG tablet Take 1 tablet (7.5 mg total) by mouth 2 (two) times daily.   montelukast (SINGULAIR) 10 MG tablet TAKE 1 TABLET(10 MG) BY MOUTH AT BEDTIME   nystatin (MYCOSTATIN) 100000 UNIT/ML suspension SWISH, GARGLE AND SPIT 5 MILLILITERS BY MOUTH 4 TIMES A DAY FOR 1 WEEK   nystatin-triamcinolone (MYCOLOG II) cream Apply twice a day prn   ondansetron (ZOFRAN) 4 MG tablet TAKE 1 TABLET(4 MG) BY MOUTH EVERY 8 HOURS AS NEEDED FOR NAUSEA OR VOMITING   pantoprazole (PROTONIX) 40 MG tablet TAKE 1 TABLET(40 MG) BY MOUTH DAILY   potassium chloride (MICRO-K) 10 MEQ CR capsule TAKE 2 CAPSULES(20 MEQ) BY MOUTH DAILY   promethazine (  PHENERGAN) 25 MG tablet TAKE 1 TABLET BY MOUTH EVERY 8 HOURS IF NEEDED   rizatriptan (MAXALT) 10 MG tablet Take 1 tablet (10 mg total) by mouth as needed for migraine. May repeat in 2 hours if needed   traZODone (DESYREL) 50 MG tablet Take 1 tablet (50 mg total) by mouth at bedtime as needed for sleep.   No facility-administered encounter medications on file as of 06/29/2021.    Patient Active Problem List    Diagnosis Date Noted   Other acute recurrent sinusitis 06/17/2021   Diaphoresis 05/28/2021   Flushes 05/28/2021   Chronic vaginitis 01/21/2021   COPD (chronic obstructive pulmonary disease) ([place]) 01/19/2021   Aneurysm of infrarenal abdominal aorta ([place]) 09/05/2020   Hypothyroidism (acquired) 07/10/2020   Prediabetes 07/10/2020   Ocular migraine with status migrainosus 07/10/2020   Essential hypertension 06/15/2019   Major depressive disorder, recurrent episode, mild ([place]) 09/10/2018   Mixed conductive and sensorineural hearing loss of left ear with restricted hearing of right ear 01/24/2017   Meniere's disease in remission, bilateral 01/24/2017   TOBACCO USER 11/25/2007   Dyslipidemia 06/05/2006   Diverticulosis of colon 06/05/2006    Conditions to be addressed/monitored:HTN, COPD, and sinus/ allergy  Care Plan : RN Care Manager Plan of Care  Updates made by Allison Ates, RN since 06/29/2021 12:00 AM     Problem: No plan of care established for chronic disease states ( COPD, smoking, no advanced directives, hypertension)   Priority: High     Long-Range Goal: Development of plan of care for the management of chronic disease states ( COPD, Smoking, no advanced directives, hypertension)   Start Date: 03/07/2021  Expected End Date: 03/08/2022  Priority: High  Note:   Current Barriers:  Chronic Disease Management support and education needs related to HTN, COPD, and smoking, no advanced directives 03/07/2021 COPD-  Patient reports that she feels like her COPD is getting better. Reports that she continues to use her medications as prescribed. Reports she continues to smoke 1/2 pack per day/ Describes difficulty in smoking cessation.    Reports she is having sinus issues and saw ENT who stated it was viral. Reports she was told that her Left ear is dead and she has moderate hearing loss in the right ear.  Advanced Directives- Patient reports that she has the packet that was mailed to her  but has not reviewed the packet.  04/25/21 Allison Jimenez reports allergy to cat hair, dust mites and tree pollen. She reports she has cats and is not willing to let cats go somewhere else, therefore does what she can to lessen the cat hair. Office visit with asthma/allergist on 04/05/21. Denies any signs/symptoms of COPD exacerbation. Last office visit with pulmonologist 03/21/21. She reports she has not had to use her albuterol inhaler recently. She denies any questions or concerns at this time. 06/29/2021  attempted to reach patient X2. She called me back. Reports she is doing well. Has been in [place] for 2 weeks. Reports back/pelvic pain today but states its from riding. Reports she has follow up with ortho tomorrow.  COPD: continues to smoke but states her breathing is ok. Continues to have allergy like issues. Reports she completed her antibiotic.  Sounds hoarse on the phone. States GI thinks she is also having increased reflux.  Advanced directives: Patient reports she thinks she threw away her advanced packet.   RNCM Clinical Goal(s):  Patient will demonstrate ongoing adherence to prescribed treatment plan for HTN, COPD, and no  advanced directives, smoking as evidenced by patient verbal report and reviewed of EMR  through collaboration with RN Care manager, provider, and care team.   Interventions: 1:1 collaboration with primary care provider regarding development and update of comprehensive plan of care as evidenced by provider attestation and co-signature Inter-disciplinary care team collaboration (see longitudinal plan of care) Evaluation of current treatment plan related to  self management and patient's adherence to plan as established by provider  COPD: (Status:  Continues smoking, knows how to self manage well. Goal MET ) Long Term Goal  Reviewed medications with patient, including use of prescribed maintenance and rescue inhalers, and provided instruction on medication management and the  importance of adherence Advised patient to track and manage COPD triggers Advised patient to self assesses COPD action plan zone and make appointment with provider if in the yellow zone for 48 hours without improvement Provided support and encouraged patient to continue efforts in smoking cessation Confirmed she has the 1800 quite now Discussed strategies to lessen allergy triggers  No advanced directives  (Status: Goal on Track (progressing): YES. Patient declined further engagement on this goal.) Long Term Goal  Evaluation of current treatment plan related to  no advanced directives , self-management and patient's adherence to plan as established by provider. Discussed plans with patient for ongoing care management follow up and provided patient with direct contact information for care management team Will mail another packet to patient and I reviewed process for completing advanced directives. Instructed once completed to provide a copy to PCP office    Hypertension: (Status: Goal Met.) Long Term Goal  Last practice recorded BP readings:  BP Readings from Last 3 Encounters:  06/12/21 130/80  06/07/21 128/66  05/09/21 (!) 152/70  Most recent eGFR/CrCl:  Lab Results  Component Value Date   EGFR 94 05/09/2021     Evaluation of current treatment plan related to hypertension self management and patient's adherence to plan as established by provider;   Reviewed medications with patient and discussed importance of compliance;  Discussed plans with patient for ongoing care management follow up and provided patient with direct contact information for care management team; Reviewed scheduled/upcoming provider appointments including: PCP GOAL MET  Patient Goals/Self-Care Activities: Take medications as prescribed   Attend all scheduled provider appointments Call provider office for new concerns or questions  limit outdoor activity during cold weather listen for public air quality  announcements every day follow rescue plan if symptoms flare-up keep follow-up appointments: PCP and this care manager Continue to try to cut down on smoking. 1-800-Quit-Now [phone]).  keep a blood pressure log take blood pressure log to all doctor appointments call doctor for signs and symptoms of high blood pressure Monitor blood pressure per MD orders      Plan:Next PCP appointment scheduled for: 10/23/2021 Tomasa Rand RN, BSN, CEN RN Case Manager - [facility] Mobile: [phone]

## 2021-06-29 NOTE — Telephone Encounter (Signed)
  Care Management   Follow Up Note   06/29/2021 Name: Allison Jimenez MRN: [id number] DOB: 08-13-1950   Referred by: Rochel Brome, MD Reason for referral : Chronic Care Management   An unsuccessful telephone outreach was attempted today. The patient was referred to the case management team for assistance with care management and care coordination.   Placed 2 calls to patient during assigned appointment time. No answer. Left a message requesting a call back.   Follow Up Plan: The care management team will reach out to the patient again over the next 7 days.  Tomasa Rand RN, BSN, CEN RN Case Freight forwarder - [facility] [employer] officer Mobile: [phone]

## 2021-06-29 NOTE — Patient Instructions (Signed)
Visit Information  Thank you for taking time to visit with me today. Please don't hesitate to contact me if I can be of assistance to you before our next scheduled telephone appointment.  Following are the goals we discussed today:  Follow up with Dr. Tobie Poet as planned. Call for changes in condition    If you are experiencing a Mental Health or [place] or need someone to talk to, please call the Suicide and Crisis Lifeline: 988 call the [place] National Suicide Prevention Lifeline: (727) 847-2304 or TTY: 5810383855 TTY (604)523-8350) to talk to a trained counselor call 1-800-273-TALK (toll free, 24 hour hotline) call 911   Patient verbalizes understanding of instructions and care plan provided today and agrees to view in [place]. Active MyChart status and patient understanding of how to access instructions and care plan via MyChart confirmed with patient.     Tomasa Rand RN, BSN, CEN RN Case Freight forwarder - [facility] Mobile: [phone]

## 2021-06-30 DIAGNOSIS — I10 Essential (primary) hypertension: Secondary | ICD-10-CM | POA: Diagnosis not present

## 2021-06-30 DIAGNOSIS — F17210 Nicotine dependence, cigarettes, uncomplicated: Secondary | ICD-10-CM

## 2021-06-30 DIAGNOSIS — J449 Chronic obstructive pulmonary disease, unspecified: Secondary | ICD-10-CM

## 2021-06-30 DIAGNOSIS — M533 Sacrococcygeal disorders, not elsewhere classified: Secondary | ICD-10-CM | POA: Diagnosis not present

## 2021-07-03 ENCOUNTER — Encounter: Payer: Self-pay | Admitting: Family Medicine

## 2021-07-03 ENCOUNTER — Ambulatory Visit (INDEPENDENT_AMBULATORY_CARE_PROVIDER_SITE_OTHER): Payer: Medicare PPO | Admitting: Family Medicine

## 2021-07-03 VITALS — BP 100/60 | HR 88 | Temp 98.4°F | Resp 15 | Ht 60.0 in | Wt 122.0 lb

## 2021-07-03 DIAGNOSIS — B3789 Other sites of candidiasis: Secondary | ICD-10-CM | POA: Diagnosis not present

## 2021-07-03 DIAGNOSIS — J028 Acute pharyngitis due to other specified organisms: Secondary | ICD-10-CM | POA: Diagnosis not present

## 2021-07-03 DIAGNOSIS — F17200 Nicotine dependence, unspecified, uncomplicated: Secondary | ICD-10-CM

## 2021-07-03 DIAGNOSIS — J029 Acute pharyngitis, unspecified: Secondary | ICD-10-CM | POA: Insufficient documentation

## 2021-07-03 MED ORDER — FLUCONAZOLE 200 MG PO TABS
200.0000 mg | ORAL_TABLET | Freq: Every day | ORAL | 0 refills | Status: DC
Start: 2021-07-03 — End: 2021-08-28

## 2021-07-03 NOTE — Progress Notes (Unsigned)
Acute Office Visit  Subjective:    Patient ID: Allison Jimenez, female    DOB: [birth year], 71 y.o.   MRN: [id number]  Chief Complaint  Patient presents with   Sore Throat    HPI: Patient is in today for sore throat, hoarseness since 2 weeks ago. She took cefdinir for 10 days and it did not help. She was on a trip to [place] one week ago. She had been using nystatin I sent in while she was out of state for thrush.   Past Medical History:  Diagnosis Date   Acute blood loss anemia ([facility]LA) 09/05/2020   Allergy    ANXIETY 06/05/2006   Arthritis    SHOULDERS    BACK PAIN 01/05/2010   Cataract    bilateral, left worse   Centrilobular emphysema (HCC)    Centrilobular emphysema (HCC)    COPD (chronic obstructive pulmonary disease) ([place])    DIVERTICULOSIS, COLON 06/05/2006   DIZZINESS OR VERTIGO 06/05/2006   positional   Hearing loss in left ear    HYPERLIPIDEMIA 06/05/2006   HYPOKALEMIA 12/14/2008   MENIERE'S DISEASE 06/25/2006   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   Qualifier: Diagnosis of  By: Burnice Logan  MD, Doretha Sou    Migraine    TOBACCO USER 11/25/2007    Past Surgical History:  Procedure Laterality Date   APPENDECTOMY     CESAREAN SECTION     x1   CHOLECYSTECTOMY     COLONOSCOPY     COSMETIC SURGERY     mastoid surgery  1992   shunt in mastoid- endolymphatic sac in left ear    TUBAL LIGATION     vocal cord surgery     nodule x2    Family History  Problem Relation Age of Onset   Breast cancer Mother    Migraines Other    Depression Other    Anxiety disorder Other    High Cholesterol Other    Colon cancer Neg Hx    Rectal cancer Neg Hx    Stomach cancer Neg Hx    Colon polyps Neg Hx    Esophageal cancer Neg Hx     Social History   Socioeconomic History   Marital status: Divorced    Spouse name: Not on file   Number of children: 1   Years of education: 13   Highest education level: Not on file  Occupational History    Employer: [employer]    Comment:  Retired  Tobacco Use   Smoking status: Every Day    Packs/day: 1.50    Years: 49.00    Total pack years: 73.50    Types: Cigarettes   Smokeless tobacco: Never   Tobacco comments:    Currently 1/2 ppd, uses vape   Vaping Use   Vaping Use: Some days   Start date: 01/01/2010   Substances: Nicotine   Devices: menthol/fruit flavor  Substance and Sexual Activity   Alcohol use: No    Alcohol/week: 0.0 standard drinks of alcohol   Drug use: No   Sexual activity: Not on file  Other Topics Concern   Not on file  Social History Narrative   Patient lives at home alone. Patient has a Data processing manager. Patient is divorced .   Patient is retired.   Education high school.   Right handed.   Caffeine none.   Social Determinants of Health   Financial Resource Strain: Not on file  Food Insecurity: Not on file  Transportation Needs: Not  on file  Physical Activity: Not on file  Stress: Not on file  Social Connections: Not on file  Intimate Partner Violence: Not on file    Outpatient Medications Prior to Visit  Medication Sig Dispense Refill   albuterol (VENTOLIN HFA) 108 (90 Base) MCG/ACT inhaler Inhale into the lungs.     ALPRAZolam (XANAX) 0.25 MG tablet TAKE 1 TABLET BY MOUTH THREE TIMES DAILY 90 tablet 2   amLODipine (NORVASC) 2.5 MG tablet TAKE 1 TABLET(2.5 MG) BY MOUTH DAILY 90 tablet 0   atorvastatin (LIPITOR) 20 MG tablet TAKE 1 TABLET(20 MG) BY MOUTH DAILY 90 tablet 1   chlorthalidone (HYGROTON) 25 MG tablet TAKE 1 TABLET(25 MG) BY MOUTH EVERY DAY 90 tablet 4   cyclobenzaprine (FLEXERIL) 10 MG tablet Take by mouth.     diclofenac Sodium (VOLTAREN) 1 % GEL diclofenac 1 % topical gel 100 g 0   dicyclomine (BENTYL) 20 MG tablet Take 1 tablet (20 mg total) by mouth 2 (two) times daily as needed for spasms. 180 tablet 1   Erenumab-aooe (AIMOVIG) 140 MG/ML SOAJ Every month     estradiol (ESTRACE) 0.1 MG/GM vaginal cream Place 1 Applicatorful vaginally at bedtime. Daily for 14 days  then twice weekly 42.5 g 0   famotidine (PEPCID) 40 MG tablet Take 1 tablet (40 mg total) by mouth at bedtime. 30 tablet 5   FLUoxetine (PROZAC) 20 MG capsule Take 2 capsules (40 mg total) by mouth daily. 180 capsule 1   fluticasone (FLONASE) 50 MCG/ACT nasal spray SHAKE LIQUID AND USE 2 SPRAYS IN EACH NOSTRIL DAILY 16 g 6   Fluticasone-Umeclidin-Vilant (TRELEGY ELLIPTA) 100-62.5-25 MCG/ACT AEPB Inhale 1 puff into the lungs daily. 1 each 11   ketoconazole (NIZORAL) 2 % cream Apply 1 application. topically daily. 15 g 0   linaclotide (LINZESS) 145 MCG CAPS capsule Take 1 capsule (145 mcg total) by mouth daily before breakfast. 8 capsule 0   meloxicam (MOBIC) 7.5 MG tablet Take 1 tablet (7.5 mg total) by mouth 2 (two) times daily. 90 tablet 1   montelukast (SINGULAIR) 10 MG tablet TAKE 1 TABLET(10 MG) BY MOUTH AT BEDTIME 90 tablet 2   nystatin (MYCOSTATIN) 100000 UNIT/ML suspension SWISH, GARGLE AND SPIT 5 MILLILITERS BY MOUTH 4 TIMES A DAY FOR 1 WEEK 60 mL 0   nystatin-triamcinolone (MYCOLOG II) cream Apply twice a day prn 60 g 0   ondansetron (ZOFRAN) 4 MG tablet TAKE 1 TABLET(4 MG) BY MOUTH EVERY 8 HOURS AS NEEDED FOR NAUSEA OR VOMITING 90 tablet 0   pantoprazole (PROTONIX) 40 MG tablet TAKE 1 TABLET(40 MG) BY MOUTH DAILY 90 tablet 0   potassium chloride (MICRO-K) 10 MEQ CR capsule TAKE 2 CAPSULES(20 MEQ) BY MOUTH DAILY 180 capsule 0   promethazine (PHENERGAN) 25 MG tablet TAKE 1 TABLET BY MOUTH EVERY 8 HOURS IF NEEDED 20 tablet 0   rizatriptan (MAXALT) 10 MG tablet Take 1 tablet (10 mg total) by mouth as needed for migraine. May repeat in 2 hours if needed 10 tablet 0   traZODone (DESYREL) 50 MG tablet Take 1 tablet (50 mg total) by mouth at bedtime as needed for sleep. 30 tablet 3   cefdinir (OMNICEF) 300 MG capsule Take 1 capsule (300 mg total) by mouth 2 (two) times daily. (Patient not taking: Reported on 06/29/2021) 20 capsule 0   No facility-administered medications prior to visit.     Allergies  Allergen Reactions   Erythromycin Base Diarrhea   Penicillins Rash    Review  of Systems  Constitutional:  Negative for chills, fatigue and fever.  HENT:  Positive for sore throat and voice change. Negative for congestion and ear pain.   Respiratory:  Negative for cough and shortness of breath.   Cardiovascular:  Negative for chest pain and palpitations.  Gastrointestinal:  Negative for abdominal pain, constipation, diarrhea, nausea and vomiting.  Endocrine: Negative for polydipsia, polyphagia and polyuria.  Genitourinary:  Negative for difficulty urinating and dysuria.  Musculoskeletal:  Negative for arthralgias, back pain and myalgias.  Skin:  Negative for rash.  Neurological:  Negative for headaches.  Psychiatric/Behavioral:  Negative for dysphoric mood. The patient is not nervous/anxious.        Objective:    Physical Exam Vitals reviewed.  Constitutional:      Appearance: Normal appearance.  HENT:     Right Ear: Tympanic membrane, ear canal and external ear normal.     Left Ear: Tympanic membrane, ear canal and external ear normal.     Nose: Nose normal.     Mouth/Throat:     Pharynx: Posterior oropharyngeal erythema present.      Comments: Thrush in palatal arches.  Cardiovascular:     Rate and Rhythm: Normal rate and regular rhythm.  Pulmonary:     Effort: Pulmonary effort is normal. No respiratory distress.     Breath sounds: Wheezing (mild in bases.) present.  Lymphadenopathy:     Cervical: No cervical adenopathy.  Neurological:     Mental Status: She is alert and oriented to person, place, and time.  Psychiatric:        Mood and Affect: Mood normal.        Behavior: Behavior normal.     BP 100/60   Pulse 88   Temp 98.4 F (36.9 C)   Resp 15   Ht 5' (1.524 m)   Wt 122 lb (55.3 kg)   BMI 23.83 kg/m  Wt Readings from Last 3 Encounters:  07/03/21 122 lb (55.3 kg)  06/12/21 121 lb 6.4 oz (55.1 kg)  06/07/21 123 lb 6.4 oz (56 kg)     Health Maintenance Due  Topic Date Due   Zoster Vaccines- Shingrix (2 of 2) 06/22/2016   TETANUS/TDAP  05/17/2020    There are no preventive care reminders to display for this patient.   Lab Results  Component Value Date   TSH 2.810 04/20/2021   Lab Results  Component Value Date   WBC 13.5 (H) 05/09/2021   HGB 12.8 05/09/2021   HCT 37.7 05/09/2021   MCV 90 05/09/2021   PLT 370 05/09/2021   Lab Results  Component Value Date   NA 132 (L) 05/09/2021   K 4.0 05/09/2021   CO2 22 05/09/2021   GLUCOSE 112 (H) 05/09/2021   BUN 15 05/09/2021   CREATININE 0.70 05/09/2021   BILITOT 0.3 05/09/2021   ALKPHOS 66 05/09/2021   AST 19 05/09/2021   ALT 17 05/09/2021   PROT 7.0 05/09/2021   ALBUMIN 4.7 05/09/2021   CALCIUM 10.3 05/09/2021   EGFR 94 05/09/2021   GFR 75.48 07/09/2016   Lab Results  Component Value Date   CHOL 141 04/20/2021   Lab Results  Component Value Date   HDL 55 04/20/2021   Lab Results  Component Value Date   LDLCALC 66 04/20/2021   Lab Results  Component Value Date   TRIG 113 04/20/2021   Lab Results  Component Value Date   CHOLHDL 2.6 04/20/2021   Lab Results  Component Value Date  HGBA1C 6.1 (H) 04/20/2021       Assessment & Plan:   Problem List Items Addressed This Visit       Respiratory   Chronic pharyngeal candidiasis    I am concerned about the possibility of esophageal candidiasis.  Diflucan 200 mg daily x 14 days.       Relevant Medications   fluconazole (DIFLUCAN) 200 MG tablet   Pharyngitis - Primary   Relevant Orders   POCT rapid strep A (Completed)     Other   TOBACCO USER    I discussed importance of quitting as this is why she has issues with recurrent sinusitis and bronchitis. She expressed understanding, but is not ready to quit.       Meds ordered this encounter  Medications   fluconazole (DIFLUCAN) 200 MG tablet    Sig: Take 1 tablet (200 mg total) by mouth daily.    Dispense:  14 tablet     Refill:  0    Orders Placed This Encounter  Procedures   POCT rapid strep A     Follow-up: No follow-ups on file.  An After Visit Summary was printed and given to the patient.  Rochel Brome, MD [facility] [phone]

## 2021-07-04 LAB — POCT RAPID STREP A (OFFICE): Rapid Strep A Screen: NEGATIVE

## 2021-07-04 NOTE — Assessment & Plan Note (Signed)
I am concerned about the possibility of esophageal candidiasis.  Diflucan 200 mg daily x 14 days.

## 2021-07-04 NOTE — Assessment & Plan Note (Addendum)
I discussed importance of quitting as this is why she has issues with recurrent sinusitis and bronchitis. She expressed understanding, but is not ready to quit.

## 2021-07-10 DIAGNOSIS — M545 Low back pain, unspecified: Secondary | ICD-10-CM | POA: Diagnosis not present

## 2021-07-10 DIAGNOSIS — G8929 Other chronic pain: Secondary | ICD-10-CM | POA: Diagnosis not present

## 2021-07-10 DIAGNOSIS — K760 Fatty (change of) liver, not elsewhere classified: Secondary | ICD-10-CM | POA: Diagnosis not present

## 2021-07-10 DIAGNOSIS — E871 Hypo-osmolality and hyponatremia: Secondary | ICD-10-CM | POA: Diagnosis not present

## 2021-07-10 DIAGNOSIS — D72829 Elevated white blood cell count, unspecified: Secondary | ICD-10-CM | POA: Diagnosis not present

## 2021-07-10 DIAGNOSIS — N200 Calculus of kidney: Secondary | ICD-10-CM | POA: Diagnosis not present

## 2021-07-11 DIAGNOSIS — K219 Gastro-esophageal reflux disease without esophagitis: Secondary | ICD-10-CM | POA: Diagnosis not present

## 2021-07-11 DIAGNOSIS — E871 Hypo-osmolality and hyponatremia: Secondary | ICD-10-CM | POA: Diagnosis not present

## 2021-07-11 DIAGNOSIS — D72829 Elevated white blood cell count, unspecified: Secondary | ICD-10-CM | POA: Diagnosis not present

## 2021-07-12 DIAGNOSIS — K5909 Other constipation: Secondary | ICD-10-CM | POA: Diagnosis not present

## 2021-07-12 DIAGNOSIS — E785 Hyperlipidemia, unspecified: Secondary | ICD-10-CM | POA: Diagnosis not present

## 2021-07-12 DIAGNOSIS — K219 Gastro-esophageal reflux disease without esophagitis: Secondary | ICD-10-CM | POA: Diagnosis not present

## 2021-07-12 DIAGNOSIS — J439 Emphysema, unspecified: Secondary | ICD-10-CM | POA: Diagnosis not present

## 2021-07-12 DIAGNOSIS — E039 Hypothyroidism, unspecified: Secondary | ICD-10-CM | POA: Diagnosis not present

## 2021-07-12 DIAGNOSIS — G8929 Other chronic pain: Secondary | ICD-10-CM | POA: Diagnosis not present

## 2021-07-12 DIAGNOSIS — E871 Hypo-osmolality and hyponatremia: Secondary | ICD-10-CM | POA: Diagnosis not present

## 2021-07-12 DIAGNOSIS — D72829 Elevated white blood cell count, unspecified: Secondary | ICD-10-CM | POA: Diagnosis not present

## 2021-07-12 DIAGNOSIS — M1990 Unspecified osteoarthritis, unspecified site: Secondary | ICD-10-CM | POA: Diagnosis not present

## 2021-07-14 ENCOUNTER — Telehealth: Payer: Self-pay

## 2021-07-14 NOTE — Telephone Encounter (Signed)
Spoke with patient, she refuses to go back to hospital. Does not want to go to [facility] or another hospital. Patient states sister will be finding catheter's to help her. She has been able to urinate since phone call this AM. She currently feels like she needs to have a BM and feels she will urinate then. Her back is very sore and she feels this is why she cannot relax when sitting on the toilet. Advised x2 hospital is recommended and she refused. Patient states she is not that bad to return to hospital.   [name], [facility] 07/14/21 11:32 AM

## 2021-07-14 NOTE — Telephone Encounter (Signed)
Patient calling as she was discharged from [facility] yesterday. Since discharge she has been having difficulties urinating. Patient states her sister (sister is Therapist, sports) is requesting caths to help patient over weekend. Patient last urinated at 7 am and feels like she has a lot of pressure now (902 a). She is weak since hospital. Denies dysuria, fever. Sodium was abnormal in hospital. She is having body aches however she feels this is from the hospital bed. She feels she is very weak and does not want to return to [facility]. She feels she cannot relax enough to urinate due to pain.   Please advise.  Callback: [phone] Harrell Lark 07/14/21 9:23 AM

## 2021-07-14 NOTE — Telephone Encounter (Signed)
She returned call. She did have BM and urinated at that time.   Pt feels she did not receive good care at [facility] and will not return. She has been taking tylenol to help with pain. She has been able to urinate herself.   She feels she would not be able to come into office until next week but would like hospital notes reviewed as they recommended changes in medication and she is unsure if these changes are appropriate.   [name], [place] 07/14/21 11:59 AM

## 2021-07-17 ENCOUNTER — Encounter: Payer: Self-pay | Admitting: Family Medicine

## 2021-07-17 ENCOUNTER — Ambulatory Visit (INDEPENDENT_AMBULATORY_CARE_PROVIDER_SITE_OTHER): Payer: Medicare PPO | Admitting: Family Medicine

## 2021-07-17 VITALS — BP 150/72 | HR 86 | Temp 97.6°F | Ht 60.0 in | Wt 119.0 lb

## 2021-07-17 DIAGNOSIS — I10 Essential (primary) hypertension: Secondary | ICD-10-CM

## 2021-07-17 DIAGNOSIS — E538 Deficiency of other specified B group vitamins: Secondary | ICD-10-CM | POA: Diagnosis not present

## 2021-07-17 DIAGNOSIS — D72828 Other elevated white blood cell count: Secondary | ICD-10-CM

## 2021-07-17 DIAGNOSIS — E871 Hypo-osmolality and hyponatremia: Secondary | ICD-10-CM | POA: Diagnosis not present

## 2021-07-17 DIAGNOSIS — F17200 Nicotine dependence, unspecified, uncomplicated: Secondary | ICD-10-CM

## 2021-07-17 DIAGNOSIS — M5441 Lumbago with sciatica, right side: Secondary | ICD-10-CM

## 2021-07-17 DIAGNOSIS — R339 Retention of urine, unspecified: Secondary | ICD-10-CM

## 2021-07-17 LAB — POCT URINALYSIS DIP (CLINITEK)
Bilirubin, UA: NEGATIVE
Blood, UA: NEGATIVE
Glucose, UA: NEGATIVE mg/dL
Ketones, POC UA: NEGATIVE mg/dL
Leukocytes, UA: NEGATIVE
Nitrite, UA: NEGATIVE
POC PROTEIN,UA: NEGATIVE
Spec Grav, UA: 1.015 (ref 1.010–1.025)
Urobilinogen, UA: NEGATIVE E.U./dL — AB
pH, UA: 6 (ref 5.0–8.0)

## 2021-07-17 MED ORDER — HYDROCODONE-ACETAMINOPHEN 5-325 MG PO TABS: 1.0000 | ORAL_TABLET | Freq: Two times a day (BID) | ORAL | 0 refills | Status: DC | PRN

## 2021-07-17 NOTE — Assessment & Plan Note (Signed)
I believe it is due to sciatica and back pain.  UA normal.  Rx for In and Out caths for her sister, who is a Marine scientist, to help if needed.

## 2021-07-17 NOTE — Assessment & Plan Note (Signed)
Hydrocodone/apap 5/325 mg one four times a day as needed for severe back pain. Explained this could make the retention worse.  Order lumbar MRI. I would like done today or tomorrow.

## 2021-07-17 NOTE — Assessment & Plan Note (Signed)
Nicoderm patches 21 mcg once weekly. May use nicotine lozenges or gum.  Recommend start wellbutrin xl 150 mg once daily in am.

## 2021-07-17 NOTE — Assessment & Plan Note (Addendum)
Check cmp.  Discontinue meloxicam.  Decrease prozac 20 mg daily. Add wellbutrin xl 150 mg one daily in am.  Discontinue chlorthalidone 25 mg daily. Torsemide 5 mg daily in am.

## 2021-07-17 NOTE — Assessment & Plan Note (Signed)
Take B12 500 mcg once daily.

## 2021-07-17 NOTE — Assessment & Plan Note (Signed)
Check cbc 

## 2021-07-17 NOTE — Addendum Note (Signed)
Addended byRochel Brome on: 07/17/2021 08:49 PM   Modules accepted: Level of Service

## 2021-07-17 NOTE — Assessment & Plan Note (Signed)
BP elevated, but patient is in pain.  Start Carvedilol 6.25 mg twice a day

## 2021-07-17 NOTE — Patient Instructions (Addendum)
Carvedilol 6.25 mg twice a day 2.   Take B12 500 mcg once daily.  3.   Discontinue meloxicam.  4.   Decrease prozac 20 mg daily. Add wellbutrin xl 150 mg one daily in am.  5.   Discontinue chlorthalidone 25 mg daily. Torsemide 5 mg daily in am. 6.  Nicoderm patches 21 mcg once weekly. May use nicotine lozenges or gum.   7. Hydrocodone/apap 5/325 mg four times a day as needed severe pain.

## 2021-07-17 NOTE — Progress Notes (Signed)
Subjective:  Patient ID: Allison Jimenez, female    DOB: [birth year]  Age: 71 y.o. MRN: [id number]  Chief Complaint  Patient presents with   Back Pain   Hyponatremia   Transitions Of Care    HPI Patient was admitted on 07/10/2021 and discharged on 07/13/2021 from [facility] with diagnosis of Acute exacerbation of chronic low back pain, hyponatremia, Essential hypertension. Patient's lab results revealed patient to have a elevated WBC count at 23. They also did a Urine culture that came back negative.  They discharged her stable to home. They added new prescription: Carvedilol 6.25 mg BID, Torsemide 5 mg QD am, Nicotine patch 14 mg daily, bupropion 150 mg daily, cyanocobalamin 500 mcg take 1 tablet daily.   Right sided lumbar back pain and sciatica. Patient was seen by Rosezella [place], PA about one month ago. She has received injections from him which helped. Patient is unable to urinate without standing up in the garage over a bucket. She has severe back pain when she sits on the toilet and cannot urinate. Denies urinary incontinence.   Current Outpatient Medications on File Prior to Visit  Medication Sig Dispense Refill   albuterol (VENTOLIN HFA) 108 (90 Base) MCG/ACT inhaler Inhale into the lungs.     ALPRAZolam (XANAX) 0.25 MG tablet TAKE 1 TABLET BY MOUTH THREE TIMES DAILY 90 tablet 2   amLODipine (NORVASC) 2.5 MG tablet TAKE 1 TABLET(2.5 MG) BY MOUTH DAILY 90 tablet 0   atorvastatin (LIPITOR) 20 MG tablet TAKE 1 TABLET(20 MG) BY MOUTH DAILY 90 tablet 1   buPROPion (WELLBUTRIN XL) 150 MG 24 hr tablet Take 150 mg by mouth daily.     carvedilol (COREG) 6.25 MG tablet Take 6.25 mg by mouth 2 (two) times daily.     Cyanocobalamin (B-12) 500 MCG TABS TAKE 1 TABLET BY MOUTH DAILY AT NOON     cyclobenzaprine (FLEXERIL) 10 MG tablet Take by mouth.     diclofenac Sodium (VOLTAREN) 1 % GEL diclofenac 1 % topical gel 100 g 0   dicyclomine (BENTYL) 20 MG tablet Take 1 tablet (20 mg total)  by mouth 2 (two) times daily as needed for spasms. 180 tablet 1   Erenumab-aooe (AIMOVIG) 140 MG/ML SOAJ Every month     estradiol (ESTRACE) 0.1 MG/GM vaginal cream Place 1 Applicatorful vaginally at bedtime. Daily for 14 days then twice weekly 42.5 g 0   famotidine (PEPCID) 40 MG tablet Take 1 tablet (40 mg total) by mouth at bedtime. 30 tablet 5   fluconazole (DIFLUCAN) 200 MG tablet Take 1 tablet (200 mg total) by mouth daily. 14 tablet 0   fluticasone (FLONASE) 50 MCG/ACT nasal spray SHAKE LIQUID AND USE 2 SPRAYS IN EACH NOSTRIL DAILY 16 g 6   Fluticasone-Umeclidin-Vilant (TRELEGY ELLIPTA) 100-62.5-25 MCG/ACT AEPB Inhale 1 puff into the lungs daily. 1 each 11   ketoconazole (NIZORAL) 2 % cream Apply 1 application. topically daily. 15 g 0   linaclotide (LINZESS) 145 MCG CAPS capsule Take 1 capsule (145 mcg total) by mouth daily before breakfast. 8 capsule 0   methylPREDNISolone (MEDROL DOSEPAK) 4 MG TBPK tablet Take by mouth as directed.     montelukast (SINGULAIR) 10 MG tablet TAKE 1 TABLET(10 MG) BY MOUTH AT BEDTIME 90 tablet 2   nicotine (NICODERM CQ - DOSED IN MG/24 HOURS) 14 mg/24hr patch Place 14 mg onto the skin daily.     nystatin (MYCOSTATIN) 100000 UNIT/ML suspension SWISH, GARGLE AND SPIT 5 MILLILITERS BY MOUTH 4 TIMES  A DAY FOR 1 WEEK 60 mL 0   nystatin-triamcinolone (MYCOLOG II) cream Apply twice a day prn 60 g 0   ondansetron (ZOFRAN) 4 MG tablet TAKE 1 TABLET(4 MG) BY MOUTH EVERY 8 HOURS AS NEEDED FOR NAUSEA OR VOMITING 90 tablet 0   pantoprazole (PROTONIX) 40 MG tablet TAKE 1 TABLET(40 MG) BY MOUTH DAILY 90 tablet 0   potassium chloride (MICRO-K) 10 MEQ CR capsule TAKE 2 CAPSULES(20 MEQ) BY MOUTH DAILY 180 capsule 0   promethazine (PHENERGAN) 25 MG tablet TAKE 1 TABLET BY MOUTH EVERY 8 HOURS IF NEEDED 20 tablet 0   rizatriptan (MAXALT) 10 MG tablet Take 1 tablet (10 mg total) by mouth as needed for migraine. May repeat in 2 hours if needed 10 tablet 0   torsemide (DEMADEX) 5 MG  tablet Take 5 mg by mouth every morning.     traZODone (DESYREL) 50 MG tablet Take 1 tablet (50 mg total) by mouth at bedtime as needed for sleep. 30 tablet 3   No current facility-administered medications on file prior to visit.   Past Medical History:  Diagnosis Date   Acute blood loss anemia (ABLA) 09/05/2020   Allergy    ANXIETY 06/05/2006   Arthritis    SHOULDERS    BACK PAIN 01/05/2010   Cataract    bilateral, left worse   Centrilobular emphysema (HCC)    Centrilobular emphysema (HCC)    COPD (chronic obstructive pulmonary disease) ([place])    DIVERTICULOSIS, COLON 06/05/2006   DIZZINESS OR VERTIGO 06/05/2006   positional   Hearing loss in left ear    HYPERLIPIDEMIA 06/05/2006   HYPOKALEMIA 12/14/2008   MENIERE'S DISEASE 06/25/2006   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   Qualifier: Diagnosis of  By: Burnice Logan  MD, Doretha Sou    Migraine    TOBACCO USER 11/25/2007   Past Surgical History:  Procedure Laterality Date   APPENDECTOMY     CESAREAN SECTION     x1   CHOLECYSTECTOMY     COLONOSCOPY     COSMETIC SURGERY     mastoid surgery  1992   shunt in mastoid- endolymphatic sac in left ear    TUBAL LIGATION     vocal cord surgery     nodule x2    Family History  Problem Relation Age of Onset   Breast cancer Mother    Migraines Other    Depression Other    Anxiety disorder Other    High Cholesterol Other    Colon cancer Neg Hx    Rectal cancer Neg Hx    Stomach cancer Neg Hx    Colon polyps Neg Hx    Esophageal cancer Neg Hx    Social History   Socioeconomic History   Marital status: Divorced    Spouse name: Not on file   Number of children: 1   Years of education: 13   Highest education level: Not on file  Occupational History    Employer: [employer]    Comment: Retired  Tobacco Use   Smoking status: Every Day    Packs/day: 1.50    Years: 49.00    Total pack years: 73.50    Types: Cigarettes   Smokeless tobacco: Never   Tobacco comments:    Currently  1/2 ppd, uses vape   Vaping Use   Vaping Use: Some days   Start date: 01/01/2010   Substances: Nicotine   Devices: menthol/fruit flavor  Substance and Sexual Activity   Alcohol use: No  Alcohol/week: 0.0 standard drinks of alcohol   Drug use: No   Sexual activity: Not on file  Other Topics Concern   Not on file  Social History Narrative   Patient lives at home alone. Patient has a Data processing manager. Patient is divorced .   Patient is retired.   Education high school.   Right handed.   Caffeine none.   Social Determinants of Health   Financial Resource Strain: Not on file  Food Insecurity: Not on file  Transportation Needs: Not on file  Physical Activity: Not on file  Stress: Not on file  Social Connections: Not on file    Review of Systems  Constitutional:  Positive for fatigue. Negative for appetite change and fever.  HENT:  Negative for congestion, ear pain, sinus pressure and sore throat.   Respiratory:  Negative for cough, chest tightness, shortness of breath and wheezing.   Cardiovascular:  Negative for chest pain and palpitations.  Gastrointestinal:  Negative for abdominal pain, constipation, diarrhea, nausea and vomiting.  Genitourinary:  Positive for decreased urine volume. Negative for dysuria and hematuria.       Urinary retention  Musculoskeletal:  Positive for back pain. Negative for arthralgias, joint swelling and myalgias.  Skin:  Negative for rash.  Neurological:  Negative for dizziness, weakness and headaches.  Psychiatric/Behavioral:  Negative for dysphoric mood. The patient is not nervous/anxious.      Objective:  BP (!) 150/72 (BP Location: Left Arm, Patient Position: Sitting)   Pulse 86   Temp 97.6 F (36.4 C) (Oral)   Ht 5' (1.524 m)   Wt 119 lb (54 kg)   SpO2 98%   BMI 23.24 kg/m      07/17/2021   11:33 AM 07/03/2021    3:45 PM 06/12/2021   11:08 AM  BP/Weight  Systolic BP 443 154 008  Diastolic BP 72 60 80  Wt. (Lbs) 119 122    BMI 23.24 kg/m2 23.83 kg/m2     Physical Exam Vitals reviewed.  Constitutional:      Appearance: Normal appearance. She is normal weight.  Cardiovascular:     Rate and Rhythm: Normal rate and regular rhythm.     Heart sounds: Normal heart sounds.  Pulmonary:     Effort: Pulmonary effort is normal.     Breath sounds: Normal breath sounds.  Abdominal:     General: Abdomen is flat. Bowel sounds are normal.     Palpations: Abdomen is soft.     Tenderness: There is no abdominal tenderness.     Comments: IN and out cath completed. 300 ml. UA normal.  Musculoskeletal:        General: Tenderness (lumbar. questionable SLR on rt. neg SLR on left.) present.  Neurological:     Mental Status: She is alert and oriented to person, place, and time.  Psychiatric:        Mood and Affect: Mood normal.        Behavior: Behavior normal.     Diabetic Foot Exam - Simple   No data filed      Lab Results  Component Value Date   WBC 13.5 (H) 05/09/2021   HGB 12.8 05/09/2021   HCT 37.7 05/09/2021   PLT 370 05/09/2021   GLUCOSE 112 (H) 05/09/2021   CHOL 141 04/20/2021   TRIG 113 04/20/2021   HDL 55 04/20/2021   LDLDIRECT 151.9 09/13/2011   LDLCALC 66 04/20/2021   ALT 17 05/09/2021   AST 19 05/09/2021  NA 132 (L) 05/09/2021   K 4.0 05/09/2021   CL 89 (L) 05/09/2021   CREATININE 0.70 05/09/2021   BUN 15 05/09/2021   CO2 22 05/09/2021   TSH 2.810 04/20/2021   HGBA1C 6.1 (H) 04/20/2021      Assessment & Plan:   Problem List Items Addressed This Visit       Cardiovascular and Mediastinum   Essential hypertension    BP elevated, but patient is in pain.  Start Carvedilol 6.25 mg twice a day       Relevant Medications   carvedilol (COREG) 6.25 MG tablet   torsemide (DEMADEX) 5 MG tablet     Nervous and Auditory   Acute left-sided low back pain with right-sided sciatica - Primary    Hydrocodone/apap 5/325 mg one four times a day as needed for severe back pain. Explained  this could make the retention worse.  Order lumbar MRI. I would like done today or tomorrow.       Relevant Medications   methylPREDNISolone (MEDROL DOSEPAK) 4 MG TBPK tablet   buPROPion (WELLBUTRIN XL) 150 MG 24 hr tablet   nicotine (NICODERM CQ - DOSED IN MG/24 HOURS) 14 mg/24hr patch   HYDROcodone-acetaminophen (NORCO/VICODIN) 5-325 MG tablet   Other Relevant Orders   MR Lumbar Spine Wo Contrast     Genitourinary   Urinary retention    I believe it is due to sciatica and back pain.  UA normal.  Rx for In and Out caths for her sister, who is a Marine scientist, to help if needed.       Relevant Orders   POCT URINALYSIS DIP (CLINITEK) (Completed)   MR Lumbar Spine Wo Contrast     Other   TOBACCO USER    Nicoderm patches 21 mcg once weekly. May use nicotine lozenges or gum.  Recommend start wellbutrin xl 150 mg once daily in am.       Hyponatremia    Check cmp.  Discontinue meloxicam.  Decrease prozac 20 mg daily. Add wellbutrin xl 150 mg one daily in am.  Discontinue chlorthalidone 25 mg daily. Torsemide 5 mg daily in am.       Relevant Orders   Comprehensive metabolic panel   Other elevated white blood cell count    Check cbc.       Relevant Orders   CBC With Diff/Platelet   B12 deficiency     Take B12 500 mcg once daily.      .  Meds ordered this encounter  Medications   HYDROcodone-acetaminophen (NORCO/VICODIN) 5-325 MG tablet    Sig: Take 1 tablet by mouth 2 (two) times daily as needed for severe pain (OSteoarthritis of lumbar spine.).    Dispense:  20 tablet    Refill:  0    Orders Placed This Encounter  Procedures   MR Lumbar Spine Wo Contrast   CBC With Diff/Platelet   Comprehensive metabolic panel   POCT URINALYSIS DIP (CLINITEK)     Follow-up: Return if symptoms worsen or fail to improve.  An After Visit Summary was printed and given to the patient.  Total time spent on today's visit was greater than 30 minutes, including both face-to-face time  and nonface-to-face time personally spent on review of chart (labs and imaging), discussing labs and goals, discussing further work-up, treatment options, referrals to specialist if needed, reviewing outside records of pertinent, answering patient's questions, and coordinating care. t  I,Lauren M Auman,acting as a scribe for Rochel Brome, MD.,have documented all relevant  documentation on the behalf of Rochel Brome, MD,as directed by  Rochel Brome, MD while in the presence of Rochel Brome, MD.    Rochel Brome, MD [place] [phone]

## 2021-07-18 ENCOUNTER — Other Ambulatory Visit: Payer: Self-pay

## 2021-07-18 ENCOUNTER — Other Ambulatory Visit: Payer: Self-pay | Admitting: Family Medicine

## 2021-07-18 ENCOUNTER — Ambulatory Visit
Admission: RE | Admit: 2021-07-18 | Discharge: 2021-07-18 | Disposition: A | Discharge status: Home / Self Care | Payer: Medicare PPO | Source: Ambulatory Visit | Attending: Family Medicine | Admitting: Family Medicine

## 2021-07-18 ENCOUNTER — Telehealth: Payer: Self-pay | Admitting: Family Medicine

## 2021-07-18 ENCOUNTER — Telehealth: Payer: Self-pay

## 2021-07-18 DIAGNOSIS — M5441 Lumbago with sciatica, right side: Secondary | ICD-10-CM

## 2021-07-18 DIAGNOSIS — M48061 Spinal stenosis, lumbar region without neurogenic claudication: Secondary | ICD-10-CM | POA: Diagnosis not present

## 2021-07-18 DIAGNOSIS — R339 Retention of urine, unspecified: Secondary | ICD-10-CM

## 2021-07-18 LAB — COMPREHENSIVE METABOLIC PANEL
ALT: 26 IU/L (ref 0–32)
AST: 12 IU/L (ref 0–40)
Albumin/Globulin Ratio: 1.3 (ref 1.2–2.2)
Albumin: 3.9 g/dL (ref 3.9–4.9)
Alkaline Phosphatase: 152 IU/L — ABNORMAL HIGH (ref 44–121)
BUN/Creatinine Ratio: 19 (ref 12–28)
BUN: 11 mg/dL (ref 8–27)
Bilirubin Total: 0.2 mg/dL (ref 0.0–1.2)
CO2: 23 mmol/L (ref 20–29)
Calcium: 9.6 mg/dL (ref 8.7–10.3)
Chloride: 88 mmol/L — ABNORMAL LOW (ref 96–106)
Creatinine, Ser: 0.57 mg/dL (ref 0.57–1.00)
Globulin, Total: 2.9 g/dL (ref 1.5–4.5)
Glucose: 126 mg/dL — ABNORMAL HIGH (ref 70–99)
Potassium: 3.8 mmol/L (ref 3.5–5.2)
Sodium: 130 mmol/L — ABNORMAL LOW (ref 134–144)
Total Protein: 6.8 g/dL (ref 6.0–8.5)
eGFR: 98 mL/min/{1.73_m2} (ref >59)

## 2021-07-18 LAB — CBC WITH DIFF/PLATELET
Basophils Absolute: 0.1 10*3/uL (ref 0.0–0.2)
Basos: 0 %
EOS (ABSOLUTE): 0.1 10*3/uL (ref 0.0–0.4)
Eos: 1 %
Hematocrit: 34.9 % (ref 34.0–46.6)
Hemoglobin: 11.4 g/dL (ref 11.1–15.9)
Immature Grans (Abs): 0.5 10*3/uL — ABNORMAL HIGH (ref 0.0–0.1)
Immature Granulocytes: 3 %
Lymphocytes Absolute: 2.5 10*3/uL (ref 0.7–3.1)
Lymphs: 15 %
MCH: 29.2 pg (ref 26.6–33.0)
MCHC: 32.7 g/dL (ref 31.5–35.7)
MCV: 90 fL (ref 79–97)
Monocytes Absolute: 1 10*3/uL — ABNORMAL HIGH (ref 0.1–0.9)
Monocytes: 6 %
Neutrophils Absolute: 12.2 10*3/uL — ABNORMAL HIGH (ref 1.4–7.0)
Neutrophils: 75 %
Platelets: 592 10*3/uL — ABNORMAL HIGH (ref 150–450)
RBC: 3.9 x10E6/uL (ref 3.77–5.28)
RDW: 13 % (ref 11.7–15.4)
WBC: 16.3 10*3/uL — ABNORMAL HIGH (ref 3.4–10.8)

## 2021-07-18 MED ORDER — TORSEMIDE 5 MG PO TABS: 10.0000 mg | ORAL_TABLET | Freq: Every day | ORAL | 0 refills | Status: DC

## 2021-07-18 MED ORDER — TAMSULOSIN HCL 0.4 MG PO CAPS: 0.4000 mg | ORAL_CAPSULE | Freq: Every day | ORAL | 0 refills | Status: DC

## 2021-07-18 NOTE — Telephone Encounter (Signed)
Answered on labs done yesterday. Dr Tobie Poet

## 2021-07-18 NOTE — Telephone Encounter (Signed)
Patient called stating that we were supposed to send in something to the pharmacy to help relax her urethra enabling her to urinate, and she states nothing was ever sent in except the pain medication. She is questioning if we could please send that in to the pharmacy. Patient also wanted to mention that her feet and around her ankles are swelling a little bit today and didn't know if she should be concerned? Please advise.

## 2021-07-18 NOTE — Progress Notes (Signed)
Liver function normal.  Kidney function normal.  Sodium has dropped to 130. Recheck CMP in 2 weeks. Wbc and platelets elevated. Improved from recent hospitalization.  Patient called: "Patient called stating that we were supposed to send in something to the pharmacy to help relax her urethra enabling her to urinate, and she states nothing was ever sent in except the pain medication. She is questioning if we could please send that in to the pharmacy. Patient also wanted to mention that her feet and around her ankles are swelling a little bit today and didn't know if she should be concerned? Please advise.  Answer: swelling may be related to not being as active.  Increase torsemide to 10 mg daily. May double up 5 mg pills I will send flomax to help with urinary retention.

## 2021-07-18 NOTE — Telephone Encounter (Signed)
CALLED PT TO SCHEDULE A NURSE VISIT IN 2 WEEKS FOR A RECHECK ON HER CMP. LEFT VM TO CALL OFFICE

## 2021-07-20 ENCOUNTER — Telehealth: Payer: Self-pay

## 2021-07-20 ENCOUNTER — Ambulatory Visit: Payer: Medicare PPO

## 2021-07-20 ENCOUNTER — Other Ambulatory Visit: Payer: Self-pay | Admitting: Family Medicine

## 2021-07-20 MED ORDER — GABAPENTIN 300 MG PO CAPS: ORAL_CAPSULE | ORAL | 0 refills | Status: DC

## 2021-07-20 NOTE — Telephone Encounter (Signed)
Allison Jimenez called patient.

## 2021-07-20 NOTE — Telephone Encounter (Signed)
See mri results. Dr Tobie Poet

## 2021-07-20 NOTE — Telephone Encounter (Signed)
Patient called concerned about her MRI results, she stated she is in a lot of pain and pain medication she is currently taking (Hydrocodone) is not helping her, Patient stated she would like to try tramadol for pain if possible. Please advise

## 2021-07-21 ENCOUNTER — Telehealth: Payer: Self-pay

## 2021-07-21 MED ORDER — GABAPENTIN 100 MG PO CAPS: 100.0000 mg | ORAL_CAPSULE | Freq: Three times a day (TID) | ORAL | 0 refills | Status: DC

## 2021-07-21 NOTE — Telephone Encounter (Signed)
Patient calling as she began taking gabapentin one tablet last night around 11:45 pm, was really dizzy when woke up at 2 am and 4:30 am, it has worn off this morning. She is concerned it may continue making her dizzy and is unsure if she should continue. It did help her pain. This morning she used hydrocodone to help ease pain. Please advise.   Allison Jimenez, [place] 07/21/21 8:45 AM

## 2021-07-21 NOTE — Telephone Encounter (Addendum)
Patient would like to do gabapentin 100 mg three times a day. Would like this sent to [facility] on [address]. This was sent.  [name], [place] 07/21/21 11:50 AM

## 2021-07-24 ENCOUNTER — Other Ambulatory Visit: Payer: Self-pay

## 2021-07-24 DIAGNOSIS — M8448XA Pathological fracture, other site, initial encounter for fracture: Secondary | ICD-10-CM | POA: Diagnosis not present

## 2021-07-24 DIAGNOSIS — M5136 Other intervertebral disc degeneration, lumbar region: Secondary | ICD-10-CM | POA: Diagnosis not present

## 2021-07-24 MED ORDER — HYDROCODONE-ACETAMINOPHEN 5-325 MG PO TABS: 1.0000 | ORAL_TABLET | Freq: Two times a day (BID) | ORAL | 0 refills | Status: DC | PRN

## 2021-07-24 MED ORDER — NYSTATIN 100000 UNIT/ML MT SUSP: OROMUCOSAL | 0 refills | Status: DC

## 2021-07-24 NOTE — Telephone Encounter (Signed)
Patient just left orthopedics. She will be getting scheduled for an injection but may be a month before she can get the injection.

## 2021-07-25 ENCOUNTER — Telehealth: Payer: Self-pay

## 2021-07-25 NOTE — Telephone Encounter (Signed)
Allison Jimenez had called to request a refill on medication which has been sent.  She is scheduled for a spinal injection in about 1 week.

## 2021-07-31 ENCOUNTER — Other Ambulatory Visit: Payer: Self-pay

## 2021-07-31 MED ORDER — HYDROCODONE-ACETAMINOPHEN 5-325 MG PO TABS: 1.0000 | ORAL_TABLET | Freq: Two times a day (BID) | ORAL | 0 refills | Status: DC | PRN

## 2021-08-01 ENCOUNTER — Other Ambulatory Visit: Payer: Self-pay

## 2021-08-01 ENCOUNTER — Other Ambulatory Visit: Payer: Medicare PPO

## 2021-08-01 DIAGNOSIS — E871 Hypo-osmolality and hyponatremia: Secondary | ICD-10-CM

## 2021-08-02 ENCOUNTER — Telehealth: Payer: Self-pay | Admitting: Student

## 2021-08-02 LAB — COMPREHENSIVE METABOLIC PANEL
ALT: 35 IU/L — ABNORMAL HIGH (ref 0–32)
AST: 21 IU/L (ref 0–40)
Albumin/Globulin Ratio: 1.8 (ref 1.2–2.2)
Albumin: 4.6 g/dL (ref 3.9–4.9)
Alkaline Phosphatase: 259 IU/L — ABNORMAL HIGH (ref 44–121)
BUN/Creatinine Ratio: 21 (ref 12–28)
BUN: 14 mg/dL (ref 8–27)
Bilirubin Total: 0.4 mg/dL (ref 0.0–1.2)
CO2: 25 mmol/L (ref 20–29)
Calcium: 10.2 mg/dL (ref 8.7–10.3)
Chloride: 90 mmol/L — ABNORMAL LOW (ref 96–106)
Creatinine, Ser: 0.68 mg/dL (ref 0.57–1.00)
Globulin, Total: 2.6 g/dL (ref 1.5–4.5)
Glucose: 115 mg/dL — ABNORMAL HIGH (ref 70–99)
Potassium: 4.2 mmol/L (ref 3.5–5.2)
Sodium: 134 mmol/L (ref 134–144)
Total Protein: 7.2 g/dL (ref 6.0–8.5)
eGFR: 94 mL/min/{1.73_m2} (ref >59)

## 2021-08-02 NOTE — Telephone Encounter (Signed)
Allison Jimenez looks like this is one of yours

## 2021-08-08 ENCOUNTER — Encounter: Payer: Self-pay | Admitting: Family Medicine

## 2021-08-08 ENCOUNTER — Other Ambulatory Visit: Payer: Self-pay

## 2021-08-08 MED ORDER — HYDROCODONE-ACETAMINOPHEN 5-325 MG PO TABS: 1.0000 | ORAL_TABLET | Freq: Two times a day (BID) | ORAL | 0 refills | Status: DC | PRN

## 2021-08-08 NOTE — Telephone Encounter (Signed)
Patient called to add to message. When taking torsemide and goes to bathroom she feels drained. She also feels this may be causing her dizziness.   Allison Jimenez, [place] 08/08/21 1:46 PM

## 2021-08-09 ENCOUNTER — Other Ambulatory Visit: Payer: Self-pay | Admitting: Family Medicine

## 2021-08-09 MED ORDER — CARVEDILOL 12.5 MG PO TABS: 12.5000 mg | ORAL_TABLET | Freq: Two times a day (BID) | ORAL | 0 refills | Status: DC

## 2021-08-09 MED ORDER — B-12 500 MCG PO TABS
ORAL_TABLET | ORAL | 1 refills | Status: DC
Start: 2021-08-09 — End: 2022-02-26

## 2021-08-12 ENCOUNTER — Other Ambulatory Visit: Payer: Self-pay | Admitting: Family Medicine

## 2021-08-12 DIAGNOSIS — J302 Other seasonal allergic rhinitis: Secondary | ICD-10-CM

## 2021-08-13 ENCOUNTER — Other Ambulatory Visit: Payer: Self-pay | Admitting: Family Medicine

## 2021-08-14 ENCOUNTER — Other Ambulatory Visit: Payer: Self-pay | Admitting: Family Medicine

## 2021-08-14 ENCOUNTER — Telehealth: Payer: Self-pay

## 2021-08-14 ENCOUNTER — Other Ambulatory Visit: Payer: Self-pay

## 2021-08-14 DIAGNOSIS — R748 Abnormal levels of other serum enzymes: Secondary | ICD-10-CM

## 2021-08-14 DIAGNOSIS — B370 Candidal stomatitis: Secondary | ICD-10-CM | POA: Diagnosis not present

## 2021-08-14 DIAGNOSIS — J029 Acute pharyngitis, unspecified: Secondary | ICD-10-CM | POA: Diagnosis not present

## 2021-08-14 NOTE — Telephone Encounter (Signed)
Patient is hoarse and has red throat. She is requesting refill of fluconazole 200 mg. This began last Thursday. This is not the first time this has happened.   She requested message be sent requesting this medication. No availabilities with Dr Tobie Poet this week. Please advise.   [name], [place] 08/14/21 2:08 PM

## 2021-08-14 NOTE — Telephone Encounter (Signed)
Patient aware. She will go to urgent care.   Harrell Lark 08/14/21 2:48 PM

## 2021-08-15 ENCOUNTER — Ambulatory Visit: Payer: Medicare PPO

## 2021-08-16 ENCOUNTER — Ambulatory Visit: Payer: Medicare PPO

## 2021-08-16 ENCOUNTER — Ambulatory Visit: Payer: Medicare PPO | Admitting: Nurse Practitioner

## 2021-08-16 VITALS — BP 148/74

## 2021-08-16 DIAGNOSIS — Z0000 Encounter for general adult medical examination without abnormal findings: Secondary | ICD-10-CM

## 2021-08-16 NOTE — Progress Notes (Signed)
MEDICARE ANNUAL WELLNESS VISIT  08/16/2021  Telephone Visit Disclaimer This Medicare AWV was conducted by telephone due to national recommendations for restrictions regarding the COVID-19 Pandemic (e.g. social distancing).  I verified, using two identifiers, that I am speaking with Allison Jimenez or their authorized healthcare agent. I discussed the limitations, risks, security, and privacy concerns of performing an evaluation and management service by telephone and the potential availability of an in-person appointment in the future. The patient expressed understanding and agreed to proceed.  Location of Patient: home Location of Provider (nurse):  work  Subjective:    Allison Jimenez is a 71 y.o. female patient of Cox, Kirsten, MD who had a Medicare Annual Wellness Visit today via telephone. Allison Jimenez is Retired and lives with their family. she has 1 children. she reports that she is socially active and does interact with friends/family regularly. she is minimally physically active and enjoys watching TV, play on computerand being outside.  Patient Care Team: Rochel Brome, MD as PCP - General (Family Medicine) Magdalen Spatz, NP as Nurse Practitioner (Pulmonary Disease) Teodora Medici, PA-C as Physician Assistant (General Practice) Misenheimer, Christia Reading, MD as Consulting Physician (Gastroenterology) Jola Schmidt, MD as Consulting Physician (Ophthalmology) Versie Starks (Physician Assistant) [name], MD as Consulting Physician (Pulmonary Disease)     08/16/2021    2:11 PM 11/01/2020    1:24 PM 02/27/2019    9:45 AM 08/28/2016   12:51 PM 08/12/2014    1:02 PM 10/29/2013   11:05 AM  Advanced Directives  Does Patient Have a Medical Advance Directive? No No No No No No  Would patient like information on creating a medical advance directive? Yes (Inpatient - patient defers creating a medical advance directive at this time - Information given) Yes (ED - Information included  in AVS)   Yes - Educational materials given     Hospital Utilization Over the Past 12 Months: # of hospitalizations or ER visits: 1 # of surgeries: 0  Review of Systems    Patient reports that her overall health is worse compared to last year. Review of Systems  Constitutional:  Negative for diaphoresis and weight loss.  Eyes:  Negative for blurred vision, double vision and pain.  Respiratory:  Negative for shortness of breath.   Cardiovascular:  Negative for chest pain, palpitations, orthopnea and leg swelling.  Gastrointestinal:  Negative for abdominal pain.  Musculoskeletal:  Positive for back pain.  Skin:  Negative for rash.  Neurological:  Negative for dizziness, sensory change, loss of consciousness, weakness and headaches.  Endo/Heme/Allergies:  Negative for polydipsia. Does not bruise/bleed easily.  Psychiatric/Behavioral:  Negative for memory loss. The patient does not have insomnia.   All other systems reviewed and are negative.    History obtained from the patient  Patient Reported Readings (BP, Pulse, CBG, Weight, etc) none  Pain Assessment       Current Medications & Allergies (verified) Allergies as of 08/16/2021       Reactions   Erythromycin Base Diarrhea   Penicillins Rash        Medication List        Accurate as of August 16, 2021  2:18 PM. If you have any questions, ask your nurse or doctor.          Aimovig 140 MG/ML Soaj Generic drug: Erenumab-aooe Every month   albuterol 108 (90 Base) MCG/ACT inhaler Commonly known as: VENTOLIN HFA Inhale into the lungs.   ALPRAZolam 0.25 MG  tablet Commonly known as: XANAX TAKE 1 TABLET BY MOUTH THREE TIMES DAILY   amLODipine 2.5 MG tablet Commonly known as: NORVASC TAKE 1 TABLET(2.5 MG) BY MOUTH DAILY   atorvastatin 20 MG tablet Commonly known as: LIPITOR TAKE 1 TABLET(20 MG) BY MOUTH DAILY   B-12 500 MCG Tabs TAKE 1 TABLET BY MOUTH DAILY AT NOON   buPROPion 150 MG 24 hr  tablet Commonly known as: WELLBUTRIN XL Take 150 mg by mouth daily.   carvedilol 12.5 MG tablet Commonly known as: COREG Take 1 tablet (12.5 mg total) by mouth 2 (two) times daily with a meal.   cyclobenzaprine 10 MG tablet Commonly known as: FLEXERIL Take by mouth.   diclofenac Sodium 1 % Gel Commonly known as: VOLTAREN diclofenac 1 % topical gel   dicyclomine 20 MG tablet Commonly known as: BENTYL Take 1 tablet (20 mg total) by mouth 2 (two) times daily as needed for spasms.   estradiol 0.1 MG/GM vaginal cream Commonly known as: ESTRACE Place 1 Applicatorful vaginally at bedtime. Daily for 14 days then twice weekly   famotidine 40 MG tablet Commonly known as: PEPCID Take 1 tablet (40 mg total) by mouth at bedtime.   fluconazole 200 MG tablet Commonly known as: DIFLUCAN Take 1 tablet (200 mg total) by mouth daily.   fluticasone 50 MCG/ACT nasal spray Commonly known as: FLONASE SHAKE LIQUID AND USE 2 SPRAYS IN EACH NOSTRIL DAILY   gabapentin 100 MG capsule Commonly known as: NEURONTIN Take 1 capsule (100 mg total) by mouth 3 (three) times daily.   HYDROcodone-acetaminophen 5-325 MG tablet Commonly known as: NORCO/VICODIN Take 1 tablet by mouth 2 (two) times daily as needed for severe pain (OSteoarthritis of lumbar spine.).   ketoconazole 2 % cream Commonly known as: NIZORAL Apply 1 application. topically daily.   linaclotide 145 MCG Caps capsule Commonly known as: Linzess Take 1 capsule (145 mcg total) by mouth daily before breakfast.   methylPREDNISolone 4 MG Tbpk tablet Commonly known as: MEDROL DOSEPAK Take by mouth as directed.   montelukast 10 MG tablet Commonly known as: SINGULAIR TAKE 1 TABLET(10 MG) BY MOUTH AT BEDTIME   nicotine 14 mg/24hr patch Commonly known as: NICODERM CQ - dosed in mg/24 hours Place 14 mg onto the skin daily.   nystatin 100000 UNIT/ML suspension Commonly known as: MYCOSTATIN SWISH, GARGLE AND SPIT 5 MILLILITERS BY MOUTH  4 TIMES A DAY FOR 1 WEEK   nystatin-triamcinolone cream Commonly known as: MYCOLOG II Apply twice a day prn   ondansetron 4 MG tablet Commonly known as: ZOFRAN TAKE 1 TABLET(4 MG) BY MOUTH EVERY 8 HOURS AS NEEDED FOR NAUSEA OR VOMITING   pantoprazole 40 MG tablet Commonly known as: PROTONIX TAKE 1 TABLET(40 MG) BY MOUTH DAILY   potassium chloride 10 MEQ CR capsule Commonly known as: MICRO-K TAKE 2 CAPSULES(20 MEQ) BY MOUTH DAILY   promethazine 25 MG tablet Commonly known as: PHENERGAN TAKE 1 TABLET BY MOUTH EVERY 8 HOURS IF NEEDED   rizatriptan 10 MG tablet Commonly known as: Maxalt Take 1 tablet (10 mg total) by mouth as needed for migraine. May repeat in 2 hours if needed   tamsulosin 0.4 MG Caps capsule Commonly known as: FLOMAX TAKE 1 CAPSULE(0.4 MG) BY MOUTH AT BEDTIME   torsemide 5 MG tablet Commonly known as: DEMADEX Take 2 tablets (10 mg total) by mouth daily.   traZODone 50 MG tablet Commonly known as: DESYREL Take 1 tablet (50 mg total) by mouth at bedtime as needed  for sleep.   Trelegy Ellipta 100-62.5-25 MCG/ACT Aepb Generic drug: Fluticasone-Umeclidin-Vilant Inhale 1 puff into the lungs daily.        History (reviewed): Past Medical History:  Diagnosis Date   Acute blood loss anemia (ABLA) 09/05/2020   Allergy    ANXIETY 06/05/2006   Arthritis    SHOULDERS    BACK PAIN 01/05/2010   Cataract    bilateral, left worse   Centrilobular emphysema (HCC)    Centrilobular emphysema (HCC)    COPD (chronic obstructive pulmonary disease) ([place])    DIVERTICULOSIS, COLON 06/05/2006   DIZZINESS OR VERTIGO 06/05/2006   positional   Hearing loss in left ear    HYPERLIPIDEMIA 06/05/2006   HYPOKALEMIA 12/14/2008   MENIERE'S DISEASE 06/25/2006   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   Qualifier: Diagnosis of  By: Burnice Logan  MD, Doretha Sou    Migraine    TOBACCO USER 11/25/2007   Past Surgical History:  Procedure Laterality Date   APPENDECTOMY     CESAREAN SECTION     x1    CHOLECYSTECTOMY     COLONOSCOPY     COSMETIC SURGERY     mastoid surgery  1992   shunt in mastoid- endolymphatic sac in left ear    TUBAL LIGATION     vocal cord surgery     nodule x2   Family History  Problem Relation Age of Onset   Breast cancer Mother    Migraines Other    Depression Other    Anxiety disorder Other    High Cholesterol Other    Colon cancer Neg Hx    Rectal cancer Neg Hx    Stomach cancer Neg Hx    Colon polyps Neg Hx    Esophageal cancer Neg Hx    Social History   Socioeconomic History   Marital status: Divorced    Spouse name: Not on file   Number of children: 1   Years of education: 13   Highest education level: Not on file  Occupational History    Employer: [employer]    Comment: Retired  Tobacco Use   Smoking status: Every Day    Packs/day: 1.50    Years: 49.00    Total pack years: 73.50    Types: Cigarettes   Smokeless tobacco: Never   Tobacco comments:    Currently 1/2 ppd, uses vape   Vaping Use   Vaping Use: Some days   Start date: 01/01/2010   Substances: Nicotine   Devices: menthol/fruit flavor  Substance and Sexual Activity   Alcohol use: No    Alcohol/week: 0.0 standard drinks of alcohol   Drug use: No   Sexual activity: Not on file  Other Topics Concern   Not on file  Social History Narrative   Patient lives at home alone. Patient has a Data processing manager. Patient is divorced .   Patient is retired.   Education high school.   Right handed.   Caffeine none.   Social Determinants of Health   Financial Resource Strain: Not on file  Food Insecurity: Not on file  Transportation Needs: Not on file  Physical Activity: Not on file  Stress: Not on file  Social Connections: Not on file    Activities of Daily Living    08/16/2021    2:13 PM  In your present state of health, do you have any difficulty performing the following activities:  Hearing? 1  Comment left ear  Vision? 0  Difficulty concentrating  or making  decisions? 0  Walking or climbing stairs? 1  Comment due to back pain  Dressing or bathing? 0  Doing errands, shopping? 1  Comment not able to drive due to right fiot being numb  Preparing Food and eating ? N  Using the Toilet? N  In the past six months, have you accidently leaked urine? N  Do you have problems with loss of bowel control? N  Managing your Medications? Y  Managing your Finances? Y  Housekeeping or managing your Housekeeping? N    Patient Education/ Literacy    Exercise Exercise limited by: None identified  Diet Patient reports consuming 2 meals a day and 2 snack(s) a day Patient reports that her primary diet is: Regular Patient reports that she does have regular access to food.   Depression Screen    08/16/2021    2:13 PM 04/20/2021   10:19 AM 01/21/2021   12:33 PM 11/01/2020    1:23 PM 09/16/2020    9:23 AM 08/30/2020    2:44 PM 07/05/2020   10:49 AM  PHQ 2/9 Scores  PHQ - 2 Score 1 0 0 1 0 0 5  PHQ- 9 Score  '3 2   4 9     '$ Fall Risk    08/16/2021    2:12 PM 02/08/2021   10:51 AM 11/01/2020    1:23 PM 11/01/2020   11:02 AM 10/25/2020   11:42 AM  Fall Risk   Falls in the past year? 0 0 0 0 0  Number falls in past yr:  0 0 0 0  Injury with Fall?  0 0 0 0  Risk for fall due to : Other (Comment) No Fall Risks  No Fall Risks No Fall Risks  Risk for fall due to: Comment chronic back pain      Follow up Falls evaluation completed;Education provided Falls evaluation completed  Falls evaluation completed Falls evaluation completed     Objective:  Allison Jimenez seemed alert and oriented and she participated appropriately during our telephone visit.  Blood Pressure Weight BMI  BP Readings from Last 3 Encounters:  08/16/21 (!) 148/74  07/17/21 (!) 150/72  07/03/21 100/60   Wt Readings from Last 3 Encounters:  07/17/21 119 lb (54 kg)  07/03/21 122 lb (55.3 kg)  06/12/21 121 lb 6.4 oz (55.1 kg)   BMI Readings from Last 1 Encounters:  07/17/21  23.24 kg/m    *Unable to obtain current vital signs, weight, and BMI due to telephone visit type  Hearing/Vision  Allison Jimenez did  seem to have difficulty with hearing/understanding during the telephone conversation Reports that she has had a formal eye exam by an eye care professional within the past year Reports that she has not had a formal hearing evaluation within the past year *Unable to fully assess hearing and vision during telephone visit type  Cognitive Function:    08/16/2021    2:15 PM  6CIT Screen  What Year? 0 points  What month? 0 points  What time? 0 points  Count back from 20 0 points  Months in reverse 0 points  Repeat phrase 0 points  Total Score 0 points   (Normal:0-7, Significant for Dysfunction: >8)  Normal Cognitive Function Screening: Yes   Immunization & Health Maintenance Record Immunization History  Administered Date(s) Administered   Fluad Quad(high Dose 65+) 10/15/2019, 10/17/2020   Influenza Split 09/20/2011   Influenza, High Dose Seasonal PF 10/28/2016   Influenza,inj,Quad PF,6+ Mos 09/23/2013, 12/12/2015   Influenza-Unspecified  10/27/2014, 10/30/2018   PFIZER(Purple Top)SARS-COV-2 Vaccination 02/08/2019, 02/28/2019, 10/15/2019   Pfizer Covid-19 Vaccine Bivalent Booster 1yr & up 10/17/2020   Pneumococcal Conjugate-13 01/24/2017   Pneumococcal Polysaccharide-23 05/18/2010, 10/30/2018   Tdap 05/18/2010   Zoster Recombinat (Shingrix) 04/27/2016   Zoster, Live 12/21/2013    Health Maintenance  Topic Date Due   Zoster Vaccines- Shingrix (2 of 2) 06/22/2016   TETANUS/TDAP  05/17/2020   COVID-19 Vaccine (5 - Pfizer risk series) 12/12/2020   INFLUENZA VACCINE  08/01/2021   MAMMOGRAM  03/02/2023   COLONOSCOPY (Pts 45-459yrInsurance coverage will need to be confirmed)  11/07/2026   Pneumonia Vaccine 6563Years old  Completed   DEXA SCAN  Completed   Hepatitis C Screening  Completed   HPV VACCINES  Aged Out       Assessment  This is a  routine wellness examination for Allison Jimenez Health Maintenance: Due or Overdue Health Maintenance Due  Topic Date Due   Zoster Vaccines- Shingrix (2 of 2) 06/22/2016   TETANUS/TDAP  05/17/2020   COVID-19 Vaccine (5 - Pfizer risk series) 12/12/2020   INFLUENZA VACCINE  08/01/2021    Allison Fuchsoes not need a referral for Community Assistance: Care Management:   no Social Work:    no Prescription Assistance:  no Nutrition/Diabetes Education:  no   Plan:  Personalized Goals  Goals Addressed             This Visit's Progress    Have 3 meals a day       Keep Myself Safe       Timeframe:  Short-Term Goal Priority:  Medium Start Date:                             Expected End Date:                       Follow Up Date 09/01/2021/   - accept and begin counseling    Why is this important?   Being hurt by someone close to you is scary.  Having a plan to keep you safe is important.    Notes:        Personalized Health Maintenance & Screening Recommendations  Td vaccine shinrix  Lung Cancer Screening Recommended: scheduled for 08/22/21 (Low Dose CT Chest recommended if Age 70-80ears, 30 pack-year currently smoking OR have quit w/in past 15 years) Hepatitis C Screening recommended: no HIV Screening recommended: no  Advanced Directives: Written information was prepared per patient's request.  Referrals & Orders   Follow-up Plan Follow-up with CoRochel BromeMD as planned Schedule 11/2021 Having blood wpork done in a couple of weeks   I have personally reviewed and noted the following in the patient's chart:   Medical and social history Use of alcohol, tobacco or illicit drugs  Current medications and supplements Functional ability and status Nutritional status Physical activity Advanced directives List of other physicians Hospitalizations, surgeries, and ER visits in previous 12 months Vitals Screenings to include cognitive, depression, and  falls Referrals and appointments  In addition, I have reviewed and discussed with Allison Fuchsertain preventive protocols, quality metrics, and best practice recommendations. A written personalized care plan for preventive services as well as general preventive health recommendations is available and can be mailed to the patient at her request.      Allison Jimenez Done8/16/2023

## 2021-08-16 NOTE — Telephone Encounter (Signed)
CT scheduled 8/22.

## 2021-08-16 NOTE — Patient Instructions (Signed)
What are Advance Directives? A living will allows you to document your wishes concerning medical treatments at the end of life.   Before your living will can guide medical decision-making two physicians must certify: You are unable to make medical decisions,  You are in the medical condition specified in the state's living will law (such as "terminal illness" or "permanent unconsciousness"),  Other requirements also may apply, depending upon the state. A medical power of attorney (or healthcare proxy) allows you to appoint a person you trust as your healthcare agent (or surrogate decision maker), who is authorized to make medical decisions on your behalf.   Before a medical power of attorney goes into effect a person's physician must conclude that they are unable to make their own medical decisions. In addition: If a person regains the ability to make decisions, the agent cannot continue to act on the person's behalf.  Many states have additional requirements that apply only to decisions about life-sustaining medical treatments.  For example, before your agent can refuse a life-sustaining treatment on your behalf, a second physician may have to confirm your doctor's assessment that you are incapable of making treatment decisions. What Else Do I Need to Know?  Advance directives are legally valid throughout the [place]. While you do not need a lawyer to fill out an advance directive, your advance directive becomes legally valid as soon as you sign them in front of the required witnesses. The laws governing advance directives vary from state to state, so it is important to complete and sign advance directives that comply with your state's law. Also, advance directives can have different titles in different states.  Emergency medical technicians cannot honor living wills or medical powers of attorney. Once emergency personnel have been called, they must do what is necessary to stabilize a person  for transfer to a hospital, both from accident sites and from a home or other facility. After a physician fully evaluates the person's condition and determines the underlying conditions, advance directives can be implemented.  One state's advance directive does not always work in another state. Some states do honor advance directives from another state; others will honor out-of-state advance directives as long as they are similar to the state's own law; and some states do not have an answer to this question. The best solution is if you spend a significant amount of time in more than one state, you should complete the advance directives for all the states you spend a significant amount of time in.  Advance directives do not expire. An advance directive remains in effect until you change it. If you complete a new advance directive, it invalidates the previous one.  You should review your advance directives periodically to ensure that they still reflect your wishes. If you want to change anything in an advance directive once you have completed it, you should complete a whole new document. [facility] and [facility], [email or web address] dir

## 2021-08-17 ENCOUNTER — Other Ambulatory Visit: Payer: Self-pay

## 2021-08-17 DIAGNOSIS — M5136 Other intervertebral disc degeneration, lumbar region: Secondary | ICD-10-CM | POA: Diagnosis not present

## 2021-08-17 MED ORDER — HYDROCODONE-ACETAMINOPHEN 5-325 MG PO TABS
1.0000 | ORAL_TABLET | Freq: Two times a day (BID) | ORAL | 0 refills | Status: DC | PRN
Start: 2021-08-17 — End: 2021-08-28

## 2021-08-17 NOTE — Telephone Encounter (Signed)
Has 4 tablets left. She is getting second injection on Wednesday. She will be out by end of weekend. Please advise  Harrell Lark 08/17/21 11:35 AM

## 2021-08-22 ENCOUNTER — Inpatient Hospital Stay: Admission: RE | Admit: 2021-08-22 | Payer: Medicare PPO | Source: Ambulatory Visit

## 2021-08-22 ENCOUNTER — Other Ambulatory Visit: Payer: Self-pay | Admitting: Physician Assistant

## 2021-08-23 ENCOUNTER — Ambulatory Visit: Payer: Medicare PPO | Admitting: Student

## 2021-08-24 ENCOUNTER — Other Ambulatory Visit: Payer: Self-pay | Admitting: Physician Assistant

## 2021-08-24 ENCOUNTER — Other Ambulatory Visit: Payer: Self-pay | Admitting: Family Medicine

## 2021-08-24 DIAGNOSIS — Z122 Encounter for screening for malignant neoplasm of respiratory organs: Secondary | ICD-10-CM

## 2021-08-24 MED ORDER — FLUOXETINE HCL 20 MG PO CAPS: 40.0000 mg | ORAL_CAPSULE | Freq: Every day | ORAL | 3 refills | Status: DC

## 2021-08-25 ENCOUNTER — Ambulatory Visit
Admission: RE | Admit: 2021-08-25 | Discharge: 2021-08-25 | Disposition: A | Discharge status: Home / Self Care | Payer: Medicare PPO | Source: Ambulatory Visit | Attending: Acute Care | Admitting: Acute Care

## 2021-08-25 DIAGNOSIS — R911 Solitary pulmonary nodule: Secondary | ICD-10-CM | POA: Diagnosis not present

## 2021-08-25 DIAGNOSIS — R918 Other nonspecific abnormal finding of lung field: Secondary | ICD-10-CM | POA: Diagnosis not present

## 2021-08-25 DIAGNOSIS — J439 Emphysema, unspecified: Secondary | ICD-10-CM | POA: Diagnosis not present

## 2021-08-25 DIAGNOSIS — I700 Atherosclerosis of aorta: Secondary | ICD-10-CM | POA: Diagnosis not present

## 2021-08-25 DIAGNOSIS — F17210 Nicotine dependence, cigarettes, uncomplicated: Secondary | ICD-10-CM

## 2021-08-25 DIAGNOSIS — Z87891 Personal history of nicotine dependence: Secondary | ICD-10-CM

## 2021-08-27 NOTE — H&P (View-Only) (Signed)
Synopsis: Referred for COPD exacerbation by Rochel Brome, MD  Subjective:   PATIENT ID: Allison Jimenez: female DOB: 01/24/1950, MRN: [id number]  Chief Complaint  Patient presents with   Follow-up    Breathing is about the same. She was switched from trelegy to anoro by her PCP due to throat irritation.    70yF with history of COPD/emphysema, 22 py smoking including ecigs, meniere's, referred by Jerrell Belfast following AECOPD 11/01/20.  She has been prescribed trelegy - but she is not using it. Doesn't feel like she needs it.   Some DOE when she goes up long incline. She doesn't necessarily think it's worse than other patients her age without lung disease. She does have cough but not especially bothersome.   She says she thinks she has allergic rhinitis   She is currently enrolled in our lung cancer screening program. Vapes with some sort of ecig but unsure what is in liquid - she doesn't think it contains nicotine.   Interval HPI Continued on trelegy, started flonase, LDCT Chest with several concerning nodules  Switched to anoro by PCP for throat irritation, just started on Monday. She has also been started on diflucan. She has some cough - intermittently productive of whitish sputum. No fever, night sweats, weight loss.   Otherwise pertinent review of systems is negative.  No family history of lung cancer.   Past Medical History:  Diagnosis Date   Acute blood loss anemia (ABLA) 09/05/2020   Allergy    ANXIETY 06/05/2006   Arthritis    SHOULDERS    BACK PAIN 01/05/2010   Cataract    bilateral, left worse   Centrilobular emphysema (HCC)    Centrilobular emphysema (HCC)    COPD (chronic obstructive pulmonary disease) ([place])    DIVERTICULOSIS, COLON 06/05/2006   DIZZINESS OR VERTIGO 06/05/2006   positional   Hearing loss in left ear    HYPERLIPIDEMIA 06/05/2006   HYPOKALEMIA 12/14/2008   MENIERE'S DISEASE 06/25/2006   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   Qualifier: Diagnosis of   By: Burnice Logan  MD, Doretha Sou    Migraine    TOBACCO USER 11/25/2007     Family History  Problem Relation Age of Onset   Breast cancer Mother    Migraines Other    Depression Other    Anxiety disorder Other    High Cholesterol Other    Colon cancer Neg Hx    Rectal cancer Neg Hx    Stomach cancer Neg Hx    Colon polyps Neg Hx    Esophageal cancer Neg Hx      Past Surgical History:  Procedure Laterality Date   APPENDECTOMY     CESAREAN SECTION     x1   CHOLECYSTECTOMY     COLONOSCOPY     COSMETIC SURGERY     mastoid surgery  1992   shunt in mastoid- endolymphatic sac in left ear    TUBAL LIGATION     vocal cord surgery     nodule x2    Social History   Socioeconomic History   Marital status: Divorced    Spouse name: Not on file   Number of children: 1   Years of education: 13   Highest education level: Not on file  Occupational History    Employer: [employer]    Comment: Retired  Tobacco Use   Smoking status: Every Day    Packs/day: 1.50    Years: 49.00    Total pack years: 73.50  Types: Cigarettes   Smokeless tobacco: Never   Tobacco comments:    3/4 ppd 08/30/21 [place] Dome, CMA  Vaping Use   Vaping Use: Some days   Start date: 01/01/2010   Substances: Nicotine   Devices: menthol/fruit flavor  Substance and Sexual Activity   Alcohol use: No    Alcohol/week: 0.0 standard drinks of alcohol   Drug use: No   Sexual activity: Not on file  Other Topics Concern   Not on file  Social History Narrative   Patient lives at home alone. Patient has a Data processing manager. Patient is divorced .   Patient is retired.   Education high school.   Right handed.   Caffeine none.   Social Determinants of Health   Financial Resource Strain: Not on file  Food Insecurity: Not on file  Transportation Needs: Not on file  Physical Activity: Not on file  Stress: Not on file  Social Connections: Not on file  Intimate Partner Violence: Not on file      Allergies  Allergen Reactions   Erythromycin Base Diarrhea   Penicillins Rash     Outpatient Medications Prior to Visit  Medication Sig Dispense Refill   albuterol (VENTOLIN HFA) 108 (90 Base) MCG/ACT inhaler Inhale into the lungs.     ALPRAZolam (XANAX) 0.25 MG tablet TAKE 1 TABLET BY MOUTH THREE TIMES DAILY 90 tablet 0   atorvastatin (LIPITOR) 20 MG tablet TAKE 1 TABLET(20 MG) BY MOUTH DAILY 90 tablet 1   carvedilol (COREG) 12.5 MG tablet Take 1 tablet (12.5 mg total) by mouth 2 (two) times daily with a meal. 180 tablet 0   chlorthalidone (HYGROTON) 25 MG tablet Take 25 mg by mouth daily.     Cyanocobalamin (B-12) 500 MCG TABS TAKE 1 TABLET BY MOUTH DAILY AT NOON 90 tablet 1   cyclobenzaprine (FLEXERIL) 10 MG tablet Take by mouth.     diclofenac Sodium (VOLTAREN) 1 % GEL diclofenac 1 % topical gel 100 g 0   dicyclomine (BENTYL) 20 MG tablet Take 1 tablet (20 mg total) by mouth 2 (two) times daily as needed for spasms. 180 tablet 1   Erenumab-aooe (AIMOVIG) 140 MG/ML SOAJ Every month     estradiol (ESTRACE) 0.1 MG/GM vaginal cream Place 1 Applicatorful vaginally at bedtime. Daily for 14 days then twice weekly 42.5 g 0   famotidine (PEPCID) 40 MG tablet Take 1 tablet (40 mg total) by mouth at bedtime. 30 tablet 5   fluconazole (DIFLUCAN) 200 MG tablet Take 1 tablet (200 mg total) by mouth daily. 14 tablet 0   FLUoxetine (PROZAC) 20 MG capsule Take 2 capsules (40 mg total) by mouth daily. 180 capsule 3   fluticasone (FLONASE) 50 MCG/ACT nasal spray SHAKE LIQUID AND USE 2 SPRAYS IN EACH NOSTRIL DAILY 16 g 6   HYDROcodone-acetaminophen (NORCO/VICODIN) 5-325 MG tablet Take 1 tablet by mouth 2 (two) times daily as needed for severe pain (OSteoarthritis of lumbar spine.). 20 tablet 0   ketoconazole (NIZORAL) 2 % cream Apply 1 application. topically daily. 15 g 0   linaclotide (LINZESS) 145 MCG CAPS capsule Take 1 capsule (145 mcg total) by mouth daily before breakfast. 8 capsule 0    montelukast (SINGULAIR) 10 MG tablet TAKE 1 TABLET(10 MG) BY MOUTH AT BEDTIME 90 tablet 2   nystatin (MYCOSTATIN) 100000 UNIT/ML suspension SWISH,GARGLE AND SPIT 5 MLS BY MOUTH 4 TIMES A DAY FOR 1 WEEK 473 mL 0   pantoprazole (PROTONIX) 40 MG tablet TAKE 1 TABLET(40 MG)  BY MOUTH DAILY 90 tablet 0   potassium chloride (KLOR-CON M) 10 MEQ tablet Take 40 mEq by mouth daily.     promethazine (PHENERGAN) 25 MG tablet TAKE 1 TABLET BY MOUTH EVERY 8 HOURS IF NEEDED 20 tablet 0   rizatriptan (MAXALT) 10 MG tablet Take 1 tablet (10 mg total) by mouth as needed for migraine. May repeat in 2 hours if needed 10 tablet 0   tamsulosin (FLOMAX) 0.4 MG CAPS capsule TAKE 1 CAPSULE(0.4 MG) BY MOUTH AT BEDTIME 90 capsule 0   traZODone (DESYREL) 50 MG tablet Take 1 tablet (50 mg total) by mouth at bedtime as needed for sleep. 30 tablet 3   umeclidinium-vilanterol (ANORO ELLIPTA) 62.5-25 MCG/ACT AEPB Inhale 1 puff into the lungs daily.     Fluticasone-Umeclidin-Vilant (TRELEGY ELLIPTA) 100-62.5-25 MCG/ACT AEPB Inhale 1 puff into the lungs daily. 1 each 11   gabapentin (NEURONTIN) 100 MG capsule Take 1 capsule (100 mg total) by mouth 3 (three) times daily. 90 capsule 0   potassium chloride (MICRO-K) 10 MEQ CR capsule TAKE 2 CAPSULES(20 MEQ) BY MOUTH DAILY (Patient taking differently: 40 mEq daily.) 180 capsule 0   torsemide (DEMADEX) 5 MG tablet Take 2 tablets (10 mg total) by mouth daily. 60 tablet 0   No facility-administered medications prior to visit.       Objective:   Physical Exam:  General appearance: 71 y.o., female, NAD, conversant  Eyes: anicteric sclerae; PERRL, tracking appropriately HENT: NCAT; erythema over posterior OP but no plaques a little worse than prior Neck: Trachea midline; no lymphadenopathy, no JVD Lungs: rhonchi bl, no crackles, no wheeze, with normal respiratory effort CV: RRR, no murmur  Abdomen: Soft, non-tender; non-distended, BS present  Extremities: No peripheral edema,  warm Skin: Normal turgor and texture; no rash Psych: Appropriate affect Neuro: Alert and oriented to person and place, no focal deficit     Vitals:   08/30/21 1414  BP: 132/76  Pulse: 92  Temp: 98 F (36.7 C)  TempSrc: Oral  SpO2: 96%  Weight: 119 lb (54 kg)  Height: 5' (1.524 m)      96% on RA BMI Readings from Last 3 Encounters:  08/30/21 23.24 kg/m  08/28/21 23.32 kg/m  07/17/21 23.24 kg/m   Wt Readings from Last 3 Encounters:  08/30/21 119 lb (54 kg)  08/28/21 119 lb 6.4 oz (54.2 kg)  07/17/21 119 lb (54 kg)     CBC    Component Value Date/Time   WBC 20.4 (HH) 08/28/2021 1536   WBC 9.2 07/14/2020 1145   RBC 3.94 08/28/2021 1536   RBC 4.20 07/14/2020 1145   HGB 12.7 08/28/2021 1536   HCT 37.6 08/28/2021 1536   PLT 325 08/28/2021 1536   MCV 95 08/28/2021 1536   MCH 32.2 08/28/2021 1536   MCH 28.6 07/14/2020 1145   MCHC 33.8 08/28/2021 1536   MCHC 33.6 07/14/2020 1145   RDW 15.4 08/28/2021 1536   LYMPHSABS 3.1 08/28/2021 1536   MONOABS 0.7 07/14/2020 1145   EOSABS 0.0 08/28/2021 1536   BASOSABS 0.1 08/28/2021 1536    Eos 100-300  Chest Imaging: CT 05/06/20 reviewed by me with emphysema, small <16m nodules  CT 05/2021 reviewed by me new nodular focus of likely scar 1.575mapiral RUL, new solid RML 4.38m35module  LDCT Chest 08/25/21 reviewed by me with multiple new concerning appearing nodules, substantially decreased size of RUL nodule.   Pulmonary Functions Testing Results:    Latest Ref Rng & Units 03/21/2021  12:57 PM  PFT Results  FVC-Pre L 2.47   FVC-Predicted Pre % 94   FVC-Post L 2.56   FVC-Predicted Post % 97   Pre FEV1/FVC % % 61   Post FEV1/FCV % % 60   FEV1-Pre L 1.52   FEV1-Predicted Pre % 76   FEV1-Post L 1.55   DLCO uncorrected ml/min/mmHg 10.29   DLCO UNC% % 59   DLCO corrected ml/min/mmHg 10.29   DLCO COR %Predicted % 59   DLVA Predicted % 56   TLC L 6.32   TLC % Predicted % 139   RV % Predicted % 197   Reviewed by me  with Mild obstruction, hyperinflation, air trapping, moderately reduced diffusing capacity  Echocardiogram:   TTE 2021 impaired relaxation     Assessment & Plan:   # COPD gold functional group B: # Emphysema  # Rhinitis # Postnasal drainage Following with allergy  # Smoking:  # Pulmonary nodules  # ?Thrush vs partially resolving mucositis  Plan: - you will be called to schedule PET scan - please send me a message or call [phone] once pet scan is done  - I'll look at PET and likely set you up for robotic navigation bronchoscopy under general anesthesia - continue anoro 1 puff once daily, albuterol as needed - finish fluconazole for thrush, declines magic mouthwash  RTC 1 month  [name], MD [facility] 08/30/2021 2:31 PM

## 2021-08-27 NOTE — Progress Notes (Unsigned)
Synopsis: Referred for COPD exacerbation by Rochel Brome, MD  Subjective:   PATIENT ID: Allison Jimenez: female DOB: 23-Oct-1950, MRN: [id number]  No chief complaint on file.  71yF with history of COPD/emphysema, 22 py smoking including ecigs, meniere's, referred by Jerrell Belfast following AECOPD 11/01/20.  She has been prescribed trelegy - but she is not using it. Doesn't feel like she needs it.   Some DOE when she goes up long incline. She doesn't necessarily think it's worse than other patients her age without lung disease. She does have cough but not especially bothersome.   She says she thinks she has allergic rhinitis   She is currently enrolled in our lung cancer screening program. Vapes with some sort of ecig but unsure what is in liquid - she doesn't think it contains nicotine.   Interval HPI Continued on trelegy, started flonase, LDCT Chest with several concerning nodules***  Otherwise pertinent review of systems is negative.  Past Medical History:  Diagnosis Date   Acute blood loss anemia (ABLA) 09/05/2020   Allergy    ANXIETY 06/05/2006   Arthritis    SHOULDERS    BACK PAIN 01/05/2010   Cataract    bilateral, left worse   Centrilobular emphysema (HCC)    Centrilobular emphysema (HCC)    COPD (chronic obstructive pulmonary disease) ([place])    DIVERTICULOSIS, COLON 06/05/2006   DIZZINESS OR VERTIGO 06/05/2006   positional   Hearing loss in left ear    HYPERLIPIDEMIA 06/05/2006   HYPOKALEMIA 12/14/2008   MENIERE'S DISEASE 06/25/2006   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   Qualifier: Diagnosis of  By: Burnice Logan  MD, Doretha Sou    Migraine    TOBACCO USER 11/25/2007     Family History  Problem Relation Age of Onset   Breast cancer Mother    Migraines Other    Depression Other    Anxiety disorder Other    High Cholesterol Other    Colon cancer Neg Hx    Rectal cancer Neg Hx    Stomach cancer Neg Hx    Colon polyps Neg Hx    Esophageal cancer Neg Hx      Past  Surgical History:  Procedure Laterality Date   APPENDECTOMY     CESAREAN SECTION     x1   CHOLECYSTECTOMY     COLONOSCOPY     COSMETIC SURGERY     mastoid surgery  1992   shunt in mastoid- endolymphatic sac in left ear    TUBAL LIGATION     vocal cord surgery     nodule x2    Social History   Socioeconomic History   Marital status: Divorced    Spouse name: Not on file   Number of children: 1   Years of education: 13   Highest education level: Not on file  Occupational History    Employer: [employer]    Comment: Retired  Tobacco Use   Smoking status: Every Day    Packs/day: 1.50    Years: 49.00    Total pack years: 73.50    Types: Cigarettes   Smokeless tobacco: Never   Tobacco comments:    Currently 1/2 ppd, uses vape   Vaping Use   Vaping Use: Some days   Start date: 01/01/2010   Substances: Nicotine   Devices: menthol/fruit flavor  Substance and Sexual Activity   Alcohol use: No    Alcohol/week: 0.0 standard drinks of alcohol   Drug use: No   Sexual  activity: Not on file  Other Topics Concern   Not on file  Social History Narrative   Patient lives at home alone. Patient has a Data processing manager. Patient is divorced .   Patient is retired.   Education high school.   Right handed.   Caffeine none.   Social Determinants of Health   Financial Resource Strain: Not on file  Food Insecurity: Not on file  Transportation Needs: Not on file  Physical Activity: Not on file  Stress: Not on file  Social Connections: Not on file  Intimate Partner Violence: Not on file     Allergies  Allergen Reactions   Erythromycin Base Diarrhea   Penicillins Rash     Outpatient Medications Prior to Visit  Medication Sig Dispense Refill   albuterol (VENTOLIN HFA) 108 (90 Base) MCG/ACT inhaler Inhale into the lungs.     ALPRAZolam (XANAX) 0.25 MG tablet TAKE 1 TABLET BY MOUTH THREE TIMES DAILY 90 tablet 0   amLODipine (NORVASC) 2.5 MG tablet TAKE 1  TABLET(2.5 MG) BY MOUTH DAILY (Patient not taking: Reported on 08/16/2021) 90 tablet 0   atorvastatin (LIPITOR) 20 MG tablet TAKE 1 TABLET(20 MG) BY MOUTH DAILY 90 tablet 1   buPROPion (WELLBUTRIN XL) 150 MG 24 hr tablet Take 150 mg by mouth daily. (Patient not taking: Reported on 08/16/2021)     carvedilol (COREG) 12.5 MG tablet Take 1 tablet (12.5 mg total) by mouth 2 (two) times daily with a meal. 180 tablet 0   Cyanocobalamin (B-12) 500 MCG TABS TAKE 1 TABLET BY MOUTH DAILY AT NOON 90 tablet 1   cyclobenzaprine (FLEXERIL) 10 MG tablet Take by mouth.     diclofenac Sodium (VOLTAREN) 1 % GEL diclofenac 1 % topical gel 100 g 0   dicyclomine (BENTYL) 20 MG tablet Take 1 tablet (20 mg total) by mouth 2 (two) times daily as needed for spasms. 180 tablet 1   Erenumab-aooe (AIMOVIG) 140 MG/ML SOAJ Every month     estradiol (ESTRACE) 0.1 MG/GM vaginal cream Place 1 Applicatorful vaginally at bedtime. Daily for 14 days then twice weekly 42.5 g 0   famotidine (PEPCID) 40 MG tablet Take 1 tablet (40 mg total) by mouth at bedtime. 30 tablet 5   fluconazole (DIFLUCAN) 200 MG tablet Take 1 tablet (200 mg total) by mouth daily. (Patient not taking: Reported on 08/16/2021) 14 tablet 0   FLUoxetine (PROZAC) 20 MG capsule Take 2 capsules (40 mg total) by mouth daily. 180 capsule 3   fluticasone (FLONASE) 50 MCG/ACT nasal spray SHAKE LIQUID AND USE 2 SPRAYS IN EACH NOSTRIL DAILY 16 g 6   Fluticasone-Umeclidin-Vilant (TRELEGY ELLIPTA) 100-62.5-25 MCG/ACT AEPB Inhale 1 puff into the lungs daily. 1 each 11   gabapentin (NEURONTIN) 100 MG capsule Take 1 capsule (100 mg total) by mouth 3 (three) times daily. 90 capsule 0   HYDROcodone-acetaminophen (NORCO/VICODIN) 5-325 MG tablet Take 1 tablet by mouth 2 (two) times daily as needed for severe pain (OSteoarthritis of lumbar spine.). 20 tablet 0   ketoconazole (NIZORAL) 2 % cream Apply 1 application. topically daily. 15 g 0   linaclotide (LINZESS) 145 MCG CAPS capsule Take  1 capsule (145 mcg total) by mouth daily before breakfast. 8 capsule 0   methylPREDNISolone (MEDROL DOSEPAK) 4 MG TBPK tablet Take by mouth as directed. (Patient not taking: Reported on 08/16/2021)     montelukast (SINGULAIR) 10 MG tablet TAKE 1 TABLET(10 MG) BY MOUTH AT BEDTIME 90 tablet 2   nicotine (NICODERM CQ -  DOSED IN MG/24 HOURS) 14 mg/24hr patch Place 14 mg onto the skin daily. (Patient not taking: Reported on 08/16/2021)     nystatin (MYCOSTATIN) 100000 UNIT/ML suspension SWISH,GARGLE AND SPIT 5 MLS BY MOUTH 4 TIMES A DAY FOR 1 WEEK 473 mL 0   nystatin-triamcinolone (MYCOLOG II) cream Apply twice a day prn (Patient not taking: Reported on 08/16/2021) 60 g 0   ondansetron (ZOFRAN) 4 MG tablet TAKE 1 TABLET(4 MG) BY MOUTH EVERY 8 HOURS AS NEEDED FOR NAUSEA OR VOMITING 90 tablet 0   pantoprazole (PROTONIX) 40 MG tablet TAKE 1 TABLET(40 MG) BY MOUTH DAILY 90 tablet 0   potassium chloride (MICRO-K) 10 MEQ CR capsule TAKE 2 CAPSULES(20 MEQ) BY MOUTH DAILY 180 capsule 0   promethazine (PHENERGAN) 25 MG tablet TAKE 1 TABLET BY MOUTH EVERY 8 HOURS IF NEEDED 20 tablet 0   rizatriptan (MAXALT) 10 MG tablet Take 1 tablet (10 mg total) by mouth as needed for migraine. May repeat in 2 hours if needed 10 tablet 0   tamsulosin (FLOMAX) 0.4 MG CAPS capsule TAKE 1 CAPSULE(0.4 MG) BY MOUTH AT BEDTIME 90 capsule 0   torsemide (DEMADEX) 5 MG tablet Take 2 tablets (10 mg total) by mouth daily. (Patient not taking: Reported on 08/16/2021) 60 tablet 0   traZODone (DESYREL) 50 MG tablet Take 1 tablet (50 mg total) by mouth at bedtime as needed for sleep. 30 tablet 3   No facility-administered medications prior to visit.       Objective:   Physical Exam:  General appearance: 71 y.o., female, NAD, conversant  Eyes: anicteric sclerae; PERRL, tracking appropriately HENT: NCAT; erythema over posterior OP but no plaques  Neck: Trachea midline; no lymphadenopathy, no JVD Lungs: rhonchi bl, no crackles, no wheeze,  with normal respiratory effort CV: RRR, no murmur  Abdomen: Soft, non-tender; non-distended, BS present  Extremities: No peripheral edema, warm Skin: Normal turgor and texture; no rash Psych: Appropriate affect Neuro: Alert and oriented to person and place, no focal deficit     There were no vitals filed for this visit.      on RA BMI Readings from Last 3 Encounters:  07/17/21 23.24 kg/m  07/03/21 23.83 kg/m  06/12/21 23.71 kg/m   Wt Readings from Last 3 Encounters:  07/17/21 119 lb (54 kg)  07/03/21 122 lb (55.3 kg)  06/12/21 121 lb 6.4 oz (55.1 kg)     CBC    Component Value Date/Time   WBC 16.3 (H) 07/17/2021 1534   WBC 9.2 07/14/2020 1145   RBC 3.90 07/17/2021 1534   RBC 4.20 07/14/2020 1145   HGB 11.4 07/17/2021 1534   HCT 34.9 07/17/2021 1534   PLT 592 (H) 07/17/2021 1534   MCV 90 07/17/2021 1534   MCH 29.2 07/17/2021 1534   MCH 28.6 07/14/2020 1145   MCHC 32.7 07/17/2021 1534   MCHC 33.6 07/14/2020 1145   RDW 13.0 07/17/2021 1534   LYMPHSABS 2.5 07/17/2021 1534   MONOABS 0.7 07/14/2020 1145   EOSABS 0.1 07/17/2021 1534   BASOSABS 0.1 07/17/2021 1534    Eos 100-300  Chest Imaging: CT 05/06/20 reviewed by me with emphysema, small <57m nodules  CT 05/2021 reviewed by me new nodular focus of likely scar 1.561mapiral RUL, new solid RML 4.69m58module  LDCT Chest 08/25/21 reviewed by me***  Pulmonary Functions Testing Results:    Latest Ref Rng & Units 03/21/2021   12:57 PM  PFT Results  FVC-Pre L 2.47   FVC-Predicted Pre %  94   FVC-Post L 2.56   FVC-Predicted Post % 97   Pre FEV1/FVC % % 61   Post FEV1/FCV % % 60   FEV1-Pre L 1.52   FEV1-Predicted Pre % 76   FEV1-Post L 1.55   DLCO uncorrected ml/min/mmHg 10.29   DLCO UNC% % 59   DLCO corrected ml/min/mmHg 10.29   DLCO COR %Predicted % 59   DLVA Predicted % 56   TLC L 6.32   TLC % Predicted % 139   RV % Predicted % 197   Reviewed by me with Mild obstruction, hyperinflation, air trapping,  moderately reduced diffusing capacity  Echocardiogram:   TTE 2021 impaired relaxation     Assessment & Plan:   # COPD gold functional group B: # Emphysema  # Rhinitis # Postnasal drainage Following with allergy  # Smoking:  # Pulmonary nodules 1.83m dominant RUL nodule which looks most like scar to me but possibility of cancer.   Plan: - would continue trelegy 100 mcg 1 puff once daily, rinse mouth after use, 2 samples given - albuterol 1-2 puffs as needed rescue inhaler - take shower clear nose of crusting then flonase 1 spray each nostril, continue zyrtec 10 mg daily for rhinitis, postnasal drainage  - we again had discussion regarding benefits of smoking cessation including decreased risk of lung cancer death twice as effective as lung cancer screening alone, decreased risk of emphysema progression, and variety of pharmacotherapies to support cessation. Will still try to gradually cut back on her own or possibly use patches and gum - nodule surveillance due 08/16/21   RTC 3 months after surveillance CT  NMaryjane Hurter MD [facility] 08/27/2021 6:39 PM

## 2021-08-28 ENCOUNTER — Ambulatory Visit (INDEPENDENT_AMBULATORY_CARE_PROVIDER_SITE_OTHER): Payer: Medicare PPO | Admitting: Family Medicine

## 2021-08-28 VITALS — BP 130/70 | HR 100 | Temp 97.2°F | Resp 18 | Ht 60.0 in | Wt 119.4 lb

## 2021-08-28 DIAGNOSIS — F17200 Nicotine dependence, unspecified, uncomplicated: Secondary | ICD-10-CM

## 2021-08-28 DIAGNOSIS — R5383 Other fatigue: Secondary | ICD-10-CM

## 2021-08-28 DIAGNOSIS — B3789 Other sites of candidiasis: Secondary | ICD-10-CM

## 2021-08-28 DIAGNOSIS — K5909 Other constipation: Secondary | ICD-10-CM | POA: Diagnosis not present

## 2021-08-28 DIAGNOSIS — R7989 Other specified abnormal findings of blood chemistry: Secondary | ICD-10-CM

## 2021-08-28 MED ORDER — FLUCONAZOLE 200 MG PO TABS: 200.0000 mg | ORAL_TABLET | Freq: Every day | ORAL | 0 refills | Status: DC

## 2021-08-28 MED ORDER — HYDROCODONE-ACETAMINOPHEN 5-325 MG PO TABS: 1.0000 | ORAL_TABLET | Freq: Two times a day (BID) | ORAL | 0 refills | Status: DC | PRN

## 2021-08-28 NOTE — Progress Notes (Unsigned)
Acute Office Visit  Subjective:    Patient ID: Allison Jimenez, female    DOB: 1950/05/30, 71 y.o.   MRN: [id number]  Chief Complaint  Patient presents with   Sore Throat   Fatigue    HPI: Patient is in today for sore throat intermittently and hoarse all the time since July 10th, 2023.  Complaining of sinus congestion. Takes zyrtec and uses flonase. Helps congestion.  Complaining of fatigue.  Had ESI and cortisone shot in right hip: the shots did not help. Has been seeing [employer]. On 09/14/2021 patient is seeing Dr. Maia Petties. Complaining of lumbar back pain. Told she had fractured vertebrae. Sciatica and numbness in feet.   Patient is depressed. I decreased prozac 20 mg daily and patient was supposed to start on wellbutrin xl. Patient did not do this. She chose to stay on prozac 40 mg daily.   Hypertension: carvedilol 12.5 mg twice daily and I discontinued chlorthalidone and started her on torsemide 5 mg daily. She could not stay out of the bathroom, so she went back on the chlorthalidone.   Tobacco use: Patient is not ready to quit.    Past Medical History:  Diagnosis Date   Acute blood loss anemia (ABLA) 09/05/2020   Allergy    ANXIETY 06/05/2006   Arthritis    SHOULDERS    BACK PAIN 01/05/2010   Cataract    bilateral, left worse   Centrilobular emphysema (HCC)    Centrilobular emphysema (HCC)    COPD (chronic obstructive pulmonary disease) ([place])    DIVERTICULOSIS, COLON 06/05/2006   DIZZINESS OR VERTIGO 06/05/2006   positional   Hearing loss in left ear    HYPERLIPIDEMIA 06/05/2006   HYPOKALEMIA 12/14/2008   MENIERE'S DISEASE 06/25/2006   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   Qualifier: Diagnosis of  By: Burnice Logan  MD, Doretha Sou    Migraine    TOBACCO USER 11/25/2007    Past Surgical History:  Procedure Laterality Date   APPENDECTOMY     CESAREAN SECTION     x1   CHOLECYSTECTOMY     COLONOSCOPY     COSMETIC SURGERY     mastoid surgery  1992   shunt in mastoid-  endolymphatic sac in left ear    TUBAL LIGATION     vocal cord surgery     nodule x2    Family History  Problem Relation Age of Onset   Breast cancer Mother    Migraines Other    Depression Other    Anxiety disorder Other    High Cholesterol Other    Colon cancer Neg Hx    Rectal cancer Neg Hx    Stomach cancer Neg Hx    Colon polyps Neg Hx    Esophageal cancer Neg Hx     Social History   Socioeconomic History   Marital status: Divorced    Spouse name: Not on file   Number of children: 1   Years of education: 13   Highest education level: Not on file  Occupational History    Employer: [employer]    Comment: Retired  Tobacco Use   Smoking status: Every Day    Packs/day: 1.50    Years: 49.00    Total pack years: 73.50    Types: Cigarettes   Smokeless tobacco: Never   Tobacco comments:    Currently 1/2 ppd, uses vape   Vaping Use   Vaping Use: Some days   Start date: 01/01/2010   Substances: Nicotine  Devices: menthol/fruit flavor  Substance and Sexual Activity   Alcohol use: No    Alcohol/week: 0.0 standard drinks of alcohol   Drug use: No   Sexual activity: Not on file  Other Topics Concern   Not on file  Social History Narrative   Patient lives at home alone. Patient has a Data processing manager. Patient is divorced .   Patient is retired.   Education high school.   Right handed.   Caffeine none.   Social Determinants of Health   Financial Resource Strain: Not on file  Food Insecurity: Not on file  Transportation Needs: Not on file  Physical Activity: Not on file  Stress: Not on file  Social Connections: Not on file  Intimate Partner Violence: Not on file    Outpatient Medications Prior to Visit  Medication Sig Dispense Refill   albuterol (VENTOLIN HFA) 108 (90 Base) MCG/ACT inhaler Inhale into the lungs.     ALPRAZolam (XANAX) 0.25 MG tablet TAKE 1 TABLET BY MOUTH THREE TIMES DAILY 90 tablet 0   atorvastatin (LIPITOR) 20 MG  tablet TAKE 1 TABLET(20 MG) BY MOUTH DAILY 90 tablet 1   carvedilol (COREG) 12.5 MG tablet Take 1 tablet (12.5 mg total) by mouth 2 (two) times daily with a meal. 180 tablet 0   Cyanocobalamin (B-12) 500 MCG TABS TAKE 1 TABLET BY MOUTH DAILY AT NOON 90 tablet 1   cyclobenzaprine (FLEXERIL) 10 MG tablet Take by mouth.     diclofenac Sodium (VOLTAREN) 1 % GEL diclofenac 1 % topical gel 100 g 0   dicyclomine (BENTYL) 20 MG tablet Take 1 tablet (20 mg total) by mouth 2 (two) times daily as needed for spasms. 180 tablet 1   Erenumab-aooe (AIMOVIG) 140 MG/ML SOAJ Every month     estradiol (ESTRACE) 0.1 MG/GM vaginal cream Place 1 Applicatorful vaginally at bedtime. Daily for 14 days then twice weekly 42.5 g 0   famotidine (PEPCID) 40 MG tablet Take 1 tablet (40 mg total) by mouth at bedtime. 30 tablet 5   FLUoxetine (PROZAC) 20 MG capsule Take 2 capsules (40 mg total) by mouth daily. 180 capsule 3   fluticasone (FLONASE) 50 MCG/ACT nasal spray SHAKE LIQUID AND USE 2 SPRAYS IN EACH NOSTRIL DAILY 16 g 6   Fluticasone-Umeclidin-Vilant (TRELEGY ELLIPTA) 100-62.5-25 MCG/ACT AEPB Inhale 1 puff into the lungs daily. 1 each 11   gabapentin (NEURONTIN) 100 MG capsule Take 1 capsule (100 mg total) by mouth 3 (three) times daily. 90 capsule 0   ketoconazole (NIZORAL) 2 % cream Apply 1 application. topically daily. 15 g 0   linaclotide (LINZESS) 145 MCG CAPS capsule Take 1 capsule (145 mcg total) by mouth daily before breakfast. 8 capsule 0   montelukast (SINGULAIR) 10 MG tablet TAKE 1 TABLET(10 MG) BY MOUTH AT BEDTIME 90 tablet 2   nystatin (MYCOSTATIN) 100000 UNIT/ML suspension SWISH,GARGLE AND SPIT 5 MLS BY MOUTH 4 TIMES A DAY FOR 1 WEEK 473 mL 0   pantoprazole (PROTONIX) 40 MG tablet TAKE 1 TABLET(40 MG) BY MOUTH DAILY 90 tablet 0   potassium chloride (MICRO-K) 10 MEQ CR capsule TAKE 2 CAPSULES(20 MEQ) BY MOUTH DAILY 180 capsule 0   promethazine (PHENERGAN) 25 MG tablet TAKE 1 TABLET BY MOUTH EVERY 8 HOURS IF  NEEDED 20 tablet 0   rizatriptan (MAXALT) 10 MG tablet Take 1 tablet (10 mg total) by mouth as needed for migraine. May repeat in 2 hours if needed 10 tablet 0   tamsulosin (FLOMAX) 0.4 MG  CAPS capsule TAKE 1 CAPSULE(0.4 MG) BY MOUTH AT BEDTIME 90 capsule 0   torsemide (DEMADEX) 5 MG tablet Take 2 tablets (10 mg total) by mouth daily. (Patient not taking: Reported on 08/16/2021) 60 tablet 0   traZODone (DESYREL) 50 MG tablet Take 1 tablet (50 mg total) by mouth at bedtime as needed for sleep. 30 tablet 3   amLODipine (NORVASC) 2.5 MG tablet TAKE 1 TABLET(2.5 MG) BY MOUTH DAILY (Patient not taking: Reported on 08/16/2021) 90 tablet 0   buPROPion (WELLBUTRIN XL) 150 MG 24 hr tablet Take 150 mg by mouth daily. (Patient not taking: Reported on 08/16/2021)     fluconazole (DIFLUCAN) 200 MG tablet Take 1 tablet (200 mg total) by mouth daily. (Patient not taking: Reported on 08/16/2021) 14 tablet 0   HYDROcodone-acetaminophen (NORCO/VICODIN) 5-325 MG tablet Take 1 tablet by mouth 2 (two) times daily as needed for severe pain (OSteoarthritis of lumbar spine.). 20 tablet 0   methylPREDNISolone (MEDROL DOSEPAK) 4 MG TBPK tablet Take by mouth as directed. (Patient not taking: Reported on 08/16/2021)     nicotine (NICODERM CQ - DOSED IN MG/24 HOURS) 14 mg/24hr patch Place 14 mg onto the skin daily. (Patient not taking: Reported on 08/16/2021)     nystatin-triamcinolone (MYCOLOG II) cream Apply twice a day prn (Patient not taking: Reported on 08/16/2021) 60 g 0   ondansetron (ZOFRAN) 4 MG tablet TAKE 1 TABLET(4 MG) BY MOUTH EVERY 8 HOURS AS NEEDED FOR NAUSEA OR VOMITING 90 tablet 0   No facility-administered medications prior to visit.    Allergies  Allergen Reactions   Erythromycin Base Diarrhea   Penicillins Rash    Review of Systems  Constitutional:  Positive for fatigue. Negative for chills and fever.  HENT:  Positive for congestion and sore throat.   Respiratory:  Positive for cough and shortness of  breath.   Musculoskeletal:  Positive for back pain.  Neurological:  Positive for numbness (right lower leg and foot) and headaches.       Objective:    Physical Exam  BP 130/70   Pulse 100   Temp (!) 97.2 F (36.2 C)   Resp 18   Ht 5' (1.524 m)   Wt 119 lb 6.4 oz (54.2 kg)   BMI 23.32 kg/m  Wt Readings from Last 3 Encounters:  08/28/21 119 lb 6.4 oz (54.2 kg)  07/17/21 119 lb (54 kg)  07/03/21 122 lb (55.3 kg)    Health Maintenance Due  Topic Date Due   Zoster Vaccines- Shingrix (2 of 2) 06/22/2016   TETANUS/TDAP  05/17/2020   COVID-19 Vaccine (5 - Pfizer risk series) 12/12/2020   INFLUENZA VACCINE  08/01/2021    There are no preventive care reminders to display for this patient.   Lab Results  Component Value Date   TSH 2.810 04/20/2021   Lab Results  Component Value Date   WBC 16.3 (H) 07/17/2021   HGB 11.4 07/17/2021   HCT 34.9 07/17/2021   MCV 90 07/17/2021   PLT 592 (H) 07/17/2021   Lab Results  Component Value Date   NA 134 08/01/2021   K 4.2 08/01/2021   CO2 25 08/01/2021   GLUCOSE 115 (H) 08/01/2021   BUN 14 08/01/2021   CREATININE 0.68 08/01/2021   BILITOT 0.4 08/01/2021   ALKPHOS 259 (H) 08/01/2021   AST 21 08/01/2021   ALT 35 (H) 08/01/2021   PROT 7.2 08/01/2021   ALBUMIN 4.6 08/01/2021   CALCIUM 10.2 08/01/2021   EGFR 94  08/01/2021   GFR 75.48 07/09/2016   Lab Results  Component Value Date   CHOL 141 04/20/2021   Lab Results  Component Value Date   HDL 55 04/20/2021   Lab Results  Component Value Date   LDLCALC 66 04/20/2021   Lab Results  Component Value Date   TRIG 113 04/20/2021   Lab Results  Component Value Date   CHOLHDL 2.6 04/20/2021   Lab Results  Component Value Date   HGBA1C 6.1 (H) 04/20/2021       Assessment & Plan:   Problem List Items Addressed This Visit   None Visit Diagnoses     Other fatigue    -  Primary   Relevant Orders   CBC with Differential/Platelet   Comprehensive metabolic panel    Acute pharyngeal candidiasis       Relevant Medications   fluconazole (DIFLUCAN) 200 MG tablet   Abnormal TSH       Relevant Orders   T4, free   TSH      Meds ordered this encounter  Medications   fluconazole (DIFLUCAN) 200 MG tablet    Sig: Take 1 tablet (200 mg total) by mouth daily.    Dispense:  14 tablet    Refill:  0   HYDROcodone-acetaminophen (NORCO/VICODIN) 5-325 MG tablet    Sig: Take 1 tablet by mouth 2 (two) times daily as needed for severe pain (OSteoarthritis of lumbar spine.).    Dispense:  20 tablet    Refill:  0    Orders Placed This Encounter  Procedures   CBC with Differential/Platelet   Comprehensive metabolic panel   T4, free   TSH    Total time spent on today's visit was greater than 30 minutes, including both face-to-face time and nonface-to-face time personally spent on review of chart (labs and imaging), discussing labs and goals, discussing further work-up, treatment options, referrals to specialist if needed, reviewing outside records of pertinent, answering patient's questions, and coordinating care.  Follow-up: No follow-ups on file.  An After Visit Summary was printed and given to the patient.  Geralynn Ochs I Leal-Borjas,acting as a scribe for Rochel Brome, MD.,have documented all relevant documentation on the behalf of Rochel Brome, MD,as directed by  Rochel Brome, MD while in the presence of Rochel Brome, MD.   Rochel Brome, MD [place] [phone]

## 2021-08-28 NOTE — Patient Instructions (Signed)
  Fluconazole 200 mg daily x14 days.  Stop Trelegy.  Start Anoro 1 inhalation daily.  Continue Linzess 145 mg once daily as needed.  Keep appointment with Dr. Maia Petties.

## 2021-08-29 ENCOUNTER — Other Ambulatory Visit: Payer: Medicare PPO

## 2021-08-29 LAB — CBC WITH DIFFERENTIAL/PLATELET
Basophils Absolute: 0.1 10*3/uL (ref 0.0–0.2)
Basos: 0 %
EOS (ABSOLUTE): 0 10*3/uL (ref 0.0–0.4)
Eos: 0 %
Hematocrit: 37.6 % (ref 34.0–46.6)
Hemoglobin: 12.7 g/dL (ref 11.1–15.9)
Immature Grans (Abs): 0.8 10*3/uL — ABNORMAL HIGH (ref 0.0–0.1)
Immature Granulocytes: 4 %
Lymphocytes Absolute: 3.1 10*3/uL (ref 0.7–3.1)
Lymphs: 15 %
MCH: 32.2 pg (ref 26.6–33.0)
MCHC: 33.8 g/dL (ref 31.5–35.7)
MCV: 95 fL (ref 79–97)
Monocytes Absolute: 1.4 10*3/uL — ABNORMAL HIGH (ref 0.1–0.9)
Monocytes: 7 %
Neutrophils Absolute: 15 10*3/uL — ABNORMAL HIGH (ref 1.4–7.0)
Neutrophils: 74 %
Platelets: 325 10*3/uL (ref 150–450)
RBC: 3.94 x10E6/uL (ref 3.77–5.28)
RDW: 15.4 % (ref 11.7–15.4)
WBC: 20.4 10*3/uL (ref 3.4–10.8)

## 2021-08-29 LAB — T4, FREE: Free T4: 0.98 ng/dL (ref 0.82–1.77)

## 2021-08-29 LAB — COMPREHENSIVE METABOLIC PANEL
ALT: 24 IU/L (ref 0–32)
AST: 14 IU/L (ref 0–40)
Albumin/Globulin Ratio: 2.3 — ABNORMAL HIGH (ref 1.2–2.2)
Albumin: 4.3 g/dL (ref 3.9–4.9)
Alkaline Phosphatase: 75 IU/L (ref 44–121)
BUN/Creatinine Ratio: 30 — ABNORMAL HIGH (ref 12–28)
BUN: 14 mg/dL (ref 8–27)
Bilirubin Total: 0.3 mg/dL (ref 0.0–1.2)
CO2: 26 mmol/L (ref 20–29)
Calcium: 9.2 mg/dL (ref 8.7–10.3)
Chloride: 88 mmol/L — ABNORMAL LOW (ref 96–106)
Creatinine, Ser: 0.47 mg/dL — ABNORMAL LOW (ref 0.57–1.00)
Globulin, Total: 1.9 g/dL (ref 1.5–4.5)
Glucose: 99 mg/dL (ref 70–99)
Potassium: 3.3 mmol/L — ABNORMAL LOW (ref 3.5–5.2)
Sodium: 130 mmol/L — ABNORMAL LOW (ref 134–144)
Total Protein: 6.2 g/dL (ref 6.0–8.5)
eGFR: 103 mL/min/{1.73_m2} (ref >59)

## 2021-08-29 LAB — TSH: TSH: 1.43 u[IU]/mL (ref 0.450–4.500)

## 2021-08-30 ENCOUNTER — Ambulatory Visit: Payer: Medicare PPO | Admitting: Student

## 2021-08-30 ENCOUNTER — Encounter: Payer: Self-pay | Admitting: Student

## 2021-08-30 VITALS — BP 132/76 | HR 92 | Temp 98.0°F | Ht 60.0 in | Wt 119.0 lb

## 2021-08-30 DIAGNOSIS — J449 Chronic obstructive pulmonary disease, unspecified: Secondary | ICD-10-CM | POA: Diagnosis not present

## 2021-08-30 DIAGNOSIS — R918 Other nonspecific abnormal finding of lung field: Secondary | ICD-10-CM

## 2021-08-30 NOTE — Patient Instructions (Signed)
-   you will be called to schedule PET scan - please send me a message or call [phone] once pet scan is done  - I'll look at PET and likely set you up for robotic navigation bronchoscopy under general anesthesia - continue anoro 1 puff once daily, albuterol as needed - finish fluconazole for thrush

## 2021-09-01 DIAGNOSIS — M7061 Trochanteric bursitis, right hip: Secondary | ICD-10-CM | POA: Diagnosis not present

## 2021-09-01 DIAGNOSIS — M5431 Sciatica, right side: Secondary | ICD-10-CM | POA: Diagnosis not present

## 2021-09-01 DIAGNOSIS — R5383 Other fatigue: Secondary | ICD-10-CM | POA: Insufficient documentation

## 2021-09-01 DIAGNOSIS — M7741 Metatarsalgia, right foot: Secondary | ICD-10-CM | POA: Diagnosis not present

## 2021-09-01 DIAGNOSIS — B3789 Other sites of candidiasis: Secondary | ICD-10-CM | POA: Insufficient documentation

## 2021-09-01 DIAGNOSIS — M8448XA Pathological fracture, other site, initial encounter for fracture: Secondary | ICD-10-CM | POA: Diagnosis not present

## 2021-09-01 DIAGNOSIS — K5909 Other constipation: Secondary | ICD-10-CM | POA: Insufficient documentation

## 2021-09-01 DIAGNOSIS — R7989 Other specified abnormal findings of blood chemistry: Secondary | ICD-10-CM | POA: Insufficient documentation

## 2021-09-01 NOTE — Assessment & Plan Note (Signed)
Check labs 

## 2021-09-01 NOTE — Assessment & Plan Note (Addendum)
Fluconazole 200 mg daily x14 days. Stop Trelegy.  Start Anoro 1 inhalation daily. KOH on exudate: hyphae.  Refer to ENT. Recurrent issues with pharyngitis and hoarseness.  Strongly encouraged patient to quit smoking.

## 2021-09-01 NOTE — Assessment & Plan Note (Signed)
Ordered labs

## 2021-09-01 NOTE — Assessment & Plan Note (Signed)
>>  ASSESSMENT AND PLAN FOR ABNORMAL TSH WRITTEN ON 09/01/2021  6:50 PM BY LEAL-BORJAS,  I, CMA  Ordered labs.

## 2021-09-01 NOTE — Assessment & Plan Note (Signed)
Continue Linzess 145 mg once daily as needed. Keep appointment with Dr. Maia Petties.

## 2021-09-04 ENCOUNTER — Encounter: Payer: Self-pay | Admitting: Family Medicine

## 2021-09-04 NOTE — Assessment & Plan Note (Signed)
Recommend cessation. Pt is not ready to quit.

## 2021-09-06 ENCOUNTER — Telehealth: Payer: Self-pay | Admitting: Student

## 2021-09-06 ENCOUNTER — Other Ambulatory Visit: Payer: Self-pay

## 2021-09-06 DIAGNOSIS — G8929 Other chronic pain: Secondary | ICD-10-CM | POA: Diagnosis not present

## 2021-09-06 DIAGNOSIS — R519 Headache, unspecified: Secondary | ICD-10-CM | POA: Diagnosis not present

## 2021-09-06 MED ORDER — ANORO ELLIPTA 62.5-25 MCG/ACT IN AEPB: 1.0000 | INHALATION_SPRAY | Freq: Every day | RESPIRATORY_TRACT | 2 refills | Status: DC

## 2021-09-06 NOTE — Telephone Encounter (Signed)
Patient would like to speak with the nurse regarding her PET on Monday.  Please call to discuss at [phone]

## 2021-09-07 ENCOUNTER — Other Ambulatory Visit: Payer: Self-pay | Admitting: Family Medicine

## 2021-09-07 MED ORDER — HYDROCODONE-ACETAMINOPHEN 5-325 MG PO TABS: 1.0000 | ORAL_TABLET | Freq: Two times a day (BID) | ORAL | 0 refills | Status: DC | PRN

## 2021-09-07 MED ORDER — STIOLTO RESPIMAT 2.5-2.5 MCG/ACT IN AERS: 2.0000 | INHALATION_SPRAY | Freq: Every day | RESPIRATORY_TRACT | 5 refills | Status: DC

## 2021-09-08 NOTE — Telephone Encounter (Signed)
Called and spoke with pt about her upcoming PET.nothing further needed.

## 2021-09-11 ENCOUNTER — Ambulatory Visit (HOSPITAL_COMMUNITY)
Admission: RE | Admit: 2021-09-11 | Discharge: 2021-09-11 | Disposition: A | Discharge status: Home / Self Care | Payer: Medicare PPO | Source: Ambulatory Visit | Attending: Student | Admitting: Student

## 2021-09-11 ENCOUNTER — Telehealth: Payer: Self-pay | Admitting: Student

## 2021-09-11 DIAGNOSIS — R918 Other nonspecific abnormal finding of lung field: Secondary | ICD-10-CM | POA: Diagnosis not present

## 2021-09-11 LAB — GLUCOSE, CAPILLARY: Glucose-Capillary: 110 mg/dL — ABNORMAL HIGH (ref 70–99)

## 2021-09-11 MED ORDER — FLUDEOXYGLUCOSE F - 18 (FDG) INJECTION
5.5500 | Freq: Once | INTRAVENOUS | Status: AC
  Administered 2021-09-11: 5.9 via INTRAVENOUS

## 2021-09-11 NOTE — Telephone Encounter (Signed)
Called patient and advised her that I will update Dr Verlee Monte that patient had her PET scan. And to be on the look out for results. Nothing further needed

## 2021-09-12 ENCOUNTER — Other Ambulatory Visit: Payer: Medicare PPO

## 2021-09-12 DIAGNOSIS — D72828 Other elevated white blood cell count: Secondary | ICD-10-CM | POA: Diagnosis not present

## 2021-09-12 DIAGNOSIS — E871 Hypo-osmolality and hyponatremia: Secondary | ICD-10-CM | POA: Diagnosis not present

## 2021-09-13 ENCOUNTER — Encounter: Payer: Self-pay | Admitting: Student

## 2021-09-13 ENCOUNTER — Telehealth: Payer: Self-pay | Admitting: Student

## 2021-09-13 DIAGNOSIS — R918 Other nonspecific abnormal finding of lung field: Secondary | ICD-10-CM

## 2021-09-13 LAB — COMPREHENSIVE METABOLIC PANEL
ALT: 32 IU/L (ref 0–32)
AST: 14 IU/L (ref 0–40)
Albumin/Globulin Ratio: 2.1 (ref 1.2–2.2)
Albumin: 4.2 g/dL (ref 3.9–4.9)
Alkaline Phosphatase: 68 IU/L (ref 44–121)
BUN/Creatinine Ratio: 26 (ref 12–28)
BUN: 12 mg/dL (ref 8–27)
Bilirubin Total: 0.4 mg/dL (ref 0.0–1.2)
CO2: 25 mmol/L (ref 20–29)
Calcium: 9.7 mg/dL (ref 8.7–10.3)
Chloride: 93 mmol/L — ABNORMAL LOW (ref 96–106)
Creatinine, Ser: 0.46 mg/dL — ABNORMAL LOW (ref 0.57–1.00)
Globulin, Total: 2 g/dL (ref 1.5–4.5)
Glucose: 110 mg/dL — ABNORMAL HIGH (ref 70–99)
Potassium: 3.9 mmol/L (ref 3.5–5.2)
Sodium: 131 mmol/L — ABNORMAL LOW (ref 134–144)
Total Protein: 6.2 g/dL (ref 6.0–8.5)
eGFR: 104 mL/min/{1.73_m2} (ref >59)

## 2021-09-13 LAB — CBC WITH DIFFERENTIAL/PLATELET
Basophils Absolute: 0 10*3/uL (ref 0.0–0.2)
Basos: 0 %
EOS (ABSOLUTE): 0 10*3/uL (ref 0.0–0.4)
Eos: 0 %
Hematocrit: 36.3 % (ref 34.0–46.6)
Hemoglobin: 12.5 g/dL (ref 11.1–15.9)
Immature Grans (Abs): 0.2 10*3/uL — ABNORMAL HIGH (ref 0.0–0.1)
Immature Granulocytes: 2 %
Lymphocytes Absolute: 2.6 10*3/uL (ref 0.7–3.1)
Lymphs: 26 %
MCH: 33.7 pg — ABNORMAL HIGH (ref 26.6–33.0)
MCHC: 34.4 g/dL (ref 31.5–35.7)
MCV: 98 fL — ABNORMAL HIGH (ref 79–97)
Monocytes Absolute: 0.8 10*3/uL (ref 0.1–0.9)
Monocytes: 9 %
Neutrophils Absolute: 6.3 10*3/uL (ref 1.4–7.0)
Neutrophils: 63 %
Platelets: 303 10*3/uL (ref 150–450)
RBC: 3.71 x10E6/uL — ABNORMAL LOW (ref 3.77–5.28)
RDW: 14.7 % (ref 11.7–15.4)
WBC: 9.9 10*3/uL (ref 3.4–10.8)

## 2021-09-13 NOTE — Telephone Encounter (Signed)
Pt has been scheduled and I have spoken to her.  Nothing further needed on this message.

## 2021-09-13 NOTE — Telephone Encounter (Signed)
Reviewed PET scan results, plan robotic bronchoscopy, order placed.

## 2021-09-13 NOTE — Progress Notes (Signed)
Blood count normal now.   Liver function normal.  Kidney function normal.

## 2021-09-14 DIAGNOSIS — M810 Age-related osteoporosis without current pathological fracture: Secondary | ICD-10-CM | POA: Diagnosis not present

## 2021-09-14 DIAGNOSIS — M5116 Intervertebral disc disorders with radiculopathy, lumbar region: Secondary | ICD-10-CM | POA: Diagnosis not present

## 2021-09-14 DIAGNOSIS — M549 Dorsalgia, unspecified: Secondary | ICD-10-CM | POA: Diagnosis not present

## 2021-09-18 ENCOUNTER — Other Ambulatory Visit: Payer: Self-pay | Admitting: Family Medicine

## 2021-09-19 ENCOUNTER — Telehealth: Payer: Self-pay | Admitting: Student

## 2021-09-19 DIAGNOSIS — M5116 Intervertebral disc disorders with radiculopathy, lumbar region: Secondary | ICD-10-CM | POA: Diagnosis not present

## 2021-09-19 NOTE — Telephone Encounter (Signed)
LM for pt on VM.  Per pt's ins, no pa is required.

## 2021-09-20 ENCOUNTER — Other Ambulatory Visit: Payer: Self-pay

## 2021-09-20 MED ORDER — HYDROCODONE-ACETAMINOPHEN 5-325 MG PO TABS: 1.0000 | ORAL_TABLET | Freq: Two times a day (BID) | ORAL | 0 refills | Status: DC | PRN

## 2021-09-20 NOTE — Telephone Encounter (Signed)
I spoke to the pt.  Nothing further needed at this time.

## 2021-09-20 NOTE — Telephone Encounter (Signed)
LM on pt's VM.  Per pt's ins, no pa is required.

## 2021-09-27 ENCOUNTER — Encounter (HOSPITAL_COMMUNITY): Payer: Self-pay | Admitting: Student

## 2021-09-27 ENCOUNTER — Other Ambulatory Visit: Payer: Self-pay

## 2021-09-27 NOTE — Progress Notes (Signed)
Anesthesia Chart Review:SAME DAY WORK-UP   Case: [id number] Date/Time: 09/28/21 0830   Procedure: ROBOTIC ASSISTED NAVIGATIONAL BRONCHOSCOPY (Bilateral)   Anesthesia type: General   Pre-op diagnosis: PULMONARY NODULE   Location: [facility]   Surgeons: [name], MD       DISCUSSION: Patient is a 71 year old female scheduled for the above procedure.  History includes smoking, HLD, COPD/emphysema, Mnire's disease, migraines, vocal cord surgery, carotid artery disease.  09/11/2021 PET Scan showed a 4.1 cm AAA.  Anesthesia team to evaluate on the day of surgery as she is a same-day work-up.  VS: BP Readings from Last 3 Encounters:  08/30/21 132/76  08/28/21 130/70  08/16/21 (!) 148/74   Pulse Readings from Last 3 Encounters:  08/30/21 92  08/28/21 100  07/17/21 86     PROVIDERS: Rochel Brome, MD is PCP Allena Katz, MD is allergist Jamelle Haring, MD is vascular surgeon. Last visit 07/26/20. Two year follow-up recommended for mild asymptomatic carotid artery disease. Leslye Peer, MD is pulmonologist   LABS: For day of surgery as indicated.  Most recent results in [facility] include: Lab Results  Component Value Date   WBC 9.9 09/12/2021   HGB 12.5 09/12/2021   HCT 36.3 09/12/2021   PLT 303 09/12/2021   GLUCOSE 110 (H) 09/12/2021   ALT 32 09/12/2021   AST 14 09/12/2021   NA 131 (L) 09/12/2021   K 3.9 09/12/2021   CL 93 (L) 09/12/2021   CREATININE 0.46 (L) 09/12/2021   BUN 12 09/12/2021   CO2 25 09/12/2021   TSH 1.430 08/28/2021   HGBA1C 6.1 (H) 04/20/2021    Spirometry 04/05/21: "FEV1 of 1.40 @ 72 % of predicted. FEV1/FVC = 0.61"  PFTs 03/21/21: FVC 2.47 (94%), post 2.56 (97%). FEV1 1.52 (76%), post 1.55 (78%). DLCO unc/cor 10.29 (59%). Mild obstruction, hyperinflation, air trapping, moderately reduced diffusing capacity   IMAGES: PET Scan 09/11/21: IMPRESSION: 1. Bilateral pulmonary nodules show variable FDG accumulation up  to low level hypermetabolism. Overall appearance is not generally different than on the 08/25/2021 exam. As noted on that study, upper lobe predominance favors atypical infection but given the FDG accumulation on PET imaging, close follow-up will be required as metastatic disease is not excluded. 2. 4.1 cm abdominal aortic aneurysm. Recommend follow-up every 12 months and vascular consultation. This recommendation follows ACR consensus guidelines: White Paper of the ACR Incidental Findings Committee II on Vascular Findings. J Am Coll Radiol 2013; 20:947-096. 3.  Aortic Atherosclerois (ICD10-170.0) 4.  Emphysema. (GEZ66-Q94.9)   CT Chest [facility] 08/23/21: IMPRESSION: 1. Unusual findings on today's examination, which include regression of the previously noted lesion of concern near the apex of the right upper lobe, but multiple new lesions scattered throughout the lungs bilaterally which are highly aggressive in appearance. However, these new lesions are mid to upper lung predominant, which would be unusual for hematogenous metastases. An alternative diagnosis such as atypical infection should be considered, however, neoplasm is not excluded and today's examination is accordingly categorized as Lung-RADS 4BS, suspicious. Additional imaging evaluation or consultation with Pulmonology or Thoracic Surgery recommended. 2. The "S" modifier above refers to potentially clinically significant non lung cancer related findings. Specifically, there is aortic atherosclerosis, in addition to left main and three-vessel coronary artery disease. Please note that although the presence of coronary artery calcium documents the presence of coronary artery disease, the severity of this disease and any potential stenosis cannot be assessed on this non-gated CT examination. Assessment for  potential risk factor modification, dietary therapy or pharmacologic therapy may be warranted, if clinically indicated. 3.  Mild diffuse bronchial wall thickening with moderate centrilobular and paraseptal emphysema; imaging findings suggestive of underlying COPD. 4. There are calcifications of the aortic valve. Echocardiographic correlation for evaluation of potential valvular dysfunction may be warranted if clinically indicated. - Aortic Atherosclerosis (ICD10-I70.0) and Emphysema (ICD10-J43.9).   EKG: Last EKG noted is from 08/25/2020: Sinus tachycardia at 107 bpm right atrial enlargement.   CV: US Carotid 07/26/20: Summary:  - Right Carotid: Velocities in the right ICA are consistent with a 40-59% stenosis.  - Left Carotid: Velocities in the left ICA are consistent with a 1-39% stenosis.  - Vertebrals:  Bilateral vertebral arteries demonstrate antegrade flow.  - Subclavians: Normal flow hemodynamics were seen in bilateral subclavian arteries.   Echo 11/25/2019: Conclusions: 1.  There is normal global left ventricular contractility. 2.  Overall left ventricular systolic function is normal with an EF between 6065%. 3.  No regional wall motion abnormalities were noted. 4.  No valvular abnormality is seen Colbert Coyer TR, trace (physiologic) PR]   Past Medical History:  Diagnosis Date   Acute blood loss anemia (ABLA) 09/05/2020   Allergy    ANXIETY 06/05/2006   Arthritis    SHOULDERS    BACK PAIN 01/05/2010   Cataract    bilateral, left worse   Centrilobular emphysema (HCC)    Centrilobular emphysema (HCC)    COPD (chronic obstructive pulmonary disease) ([place])    DIVERTICULOSIS, COLON 06/05/2006   DIZZINESS OR VERTIGO 06/05/2006   positional   Hearing loss in left ear    HYPERLIPIDEMIA 06/05/2006   HYPOKALEMIA 12/14/2008   MENIERE'S DISEASE 06/25/2006   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   Qualifier: Diagnosis of  By: Burnice Logan  MD, Doretha Sou    Migraine    TOBACCO USER 11/25/2007    Past Surgical History:  Procedure Laterality Date   APPENDECTOMY     CESAREAN SECTION     x1   CHOLECYSTECTOMY     COLONOSCOPY      COSMETIC SURGERY     mastoid surgery  1992   shunt in mastoid- endolymphatic sac in left ear    TUBAL LIGATION     vocal cord surgery     nodule x2    MEDICATIONS: No current facility-administered medications for this encounter.    acetaminophen (TYLENOL) 500 MG tablet   albuterol (VENTOLIN HFA) 108 (90 Base) MCG/ACT inhaler   ALPRAZolam (XANAX) 0.25 MG tablet   atorvastatin (LIPITOR) 20 MG tablet   Carboxymethylcellul-Glycerin (LUBRICATING EYE DROPS OP)   carvedilol (COREG) 12.5 MG tablet   cetirizine (ZYRTEC) 10 MG tablet   chlorthalidone (HYGROTON) 25 MG tablet   conjugated estrogens (PREMARIN) vaginal cream   Cyanocobalamin (B-12) 500 MCG TABS   cyclobenzaprine (FLEXERIL) 10 MG tablet   diclofenac Sodium (VOLTAREN) 1 % GEL   dicyclomine (BENTYL) 20 MG tablet   Erenumab-aooe (AIMOVIG) 140 MG/ML SOAJ   famotidine (PEPCID) 40 MG tablet   FLUoxetine (PROZAC) 20 MG capsule   fluticasone (FLONASE) 50 MCG/ACT nasal spray   HYDROcodone-acetaminophen (NORCO/VICODIN) 5-325 MG tablet   ketoconazole (NIZORAL) 2 % cream   linaclotide (LINZESS) 145 MCG CAPS capsule   montelukast (SINGULAIR) 10 MG tablet   Multiple Vitamins-Minerals (MULTIVITAMIN WITH IRON-MINERALS) liquid   nystatin (MYCOSTATIN) 100000 UNIT/ML suspension   ondansetron (ZOFRAN) 4 MG tablet   pantoprazole (PROTONIX) 40 MG tablet   potassium chloride (KLOR-CON M) 10 MEQ tablet   promethazine (PHENERGAN) 25  MG tablet   rizatriptan (MAXALT) 10 MG tablet   tamsulosin (FLOMAX) 0.4 MG CAPS capsule   Tiotropium Bromide-Olodaterol (STIOLTO RESPIMAT) 2.5-2.5 MCG/ACT AERS   estradiol (ESTRACE) 0.1 MG/GM vaginal cream   fluconazole (DIFLUCAN) 200 MG tablet    Myra Gianotti, PA-C Surgical Short Stay/Anesthesiology [facility] Phone [phone] [facility] Phone [phone] 09/27/2021 11:30 AM

## 2021-09-27 NOTE — Progress Notes (Addendum)
PCP - Dr Rochel Brome Vascular - Dr Jamelle Haring Pulmonology - Dr Leslye Peer Allergy - Dr Allena Katz Neurology - Amy Teryl Lucy, PA-C (CE)  CT Chest x-ray - 08/23/21;  Chest xray in PACU after surgery EKG - DOS Stress Test - n/a ECHO - 11/25/19 Cardiac Cath - n/a  ICD Pacemaker/Loop - n/a  Sleep Study -  n/a CPAP - none  Anesthesia review: Yes  STOP now taking any Aspirin (unless otherwise instructed by your surgeon), Aleve, Naproxen, Ibuprofen, Motrin, Advil, Goody's, BC's, all herbal medications, fish oil, and all vitamins.   Coronavirus Screening Covid test on DOS Do you have any of the following symptoms:  Cough - Yes Fever (>100.64F)  yes/no: No Runny nose yes/no: No Sore throat yes/no: No Difficulty breathing/shortness of breath  yes/no: No  Have you traveled in the last 14 days and where? yes/no: No  Patient verbalized understanding of instructions that were given via phone.

## 2021-09-27 NOTE — Anesthesia Preprocedure Evaluation (Signed)
Anesthesia Evaluation  Patient identified by MRN, date of birth, ID band Patient awake    Reviewed: Allergy & Precautions, NPO status , Patient's Chart, lab work & pertinent test results  History of Anesthesia Complications Negative for: history of anesthetic complications  Airway Mallampati: III  TM Distance: >3 FB Neck ROM: Full    Dental  (+) Edentulous Upper, Dental Advisory Given   Pulmonary shortness of breath, COPD, Current Smoker and Patient abstained from smoking.,    breath sounds clear to auscultation       Cardiovascular hypertension, (-) angina(-) Past MI and (-) CHF  Rhythm:Regular     Neuro/Psych  Headaches, PSYCHIATRIC DISORDERS Anxiety Depression  Neuromuscular disease    GI/Hepatic negative GI ROS, Neg liver ROS,   Endo/Other  diabetesHypothyroidism   Renal/GU negative Renal ROSLab Results      Component                Value               Date                      CREATININE               0.46 (L)            09/12/2021                Musculoskeletal  (+) Arthritis ,   Abdominal   Peds  Hematology negative hematology ROS (+) Lab Results      Component                Value               Date                      WBC                      9.9                 09/12/2021                HGB                      12.5                09/12/2021                HCT                      36.3                09/12/2021                MCV                      98 (H)              09/12/2021                PLT                      303                 09/12/2021              Anesthesia Other Findings   Reproductive/Obstetrics  Anesthesia Physical Anesthesia Plan  ASA: 3  Anesthesia Plan: General   Post-op Pain Management: Minimal or no pain anticipated   Induction: Intravenous  PONV Risk Score and Plan: 2 and Ondansetron, Propofol infusion, TIVA and  Treatment may vary due to age or medical condition  Airway Management Planned: Oral ETT  Additional Equipment: None  Intra-op Plan:   Post-operative Plan: Extubation in OR  Informed Consent: I have reviewed the patients History and Physical, chart, labs and discussed the procedure including the risks, benefits and alternatives for the proposed anesthesia with the patient or authorized representative who has indicated his/her understanding and acceptance.     Dental advisory given  Plan Discussed with: CRNA  Anesthesia Plan Comments: (PAT note written 09/27/2021 by Myra Gianotti, PA-C. )       Anesthesia Quick Evaluation

## 2021-09-28 ENCOUNTER — Ambulatory Visit (HOSPITAL_COMMUNITY): Payer: Medicare PPO

## 2021-09-28 ENCOUNTER — Ambulatory Visit (HOSPITAL_BASED_OUTPATIENT_CLINIC_OR_DEPARTMENT_OTHER): Payer: Medicare PPO | Admitting: Vascular Surgery

## 2021-09-28 ENCOUNTER — Ambulatory Visit (HOSPITAL_COMMUNITY): Payer: Medicare PPO | Admitting: Vascular Surgery

## 2021-09-28 ENCOUNTER — Encounter (HOSPITAL_COMMUNITY)
Admission: RE | Disposition: A | Discharge status: Home / Self Care | Payer: Self-pay | Source: Home / Self Care | Attending: Student

## 2021-09-28 ENCOUNTER — Ambulatory Visit (HOSPITAL_COMMUNITY)
Admission: RE | Admit: 2021-09-28 | Discharge: 2021-09-28 | Disposition: A | Discharge status: Home / Self Care | Payer: Medicare PPO | Attending: Student | Admitting: Student

## 2021-09-28 DIAGNOSIS — R918 Other nonspecific abnormal finding of lung field: Secondary | ICD-10-CM

## 2021-09-28 DIAGNOSIS — R896 Abnormal cytological findings in specimens from other organs, systems and tissues: Secondary | ICD-10-CM | POA: Diagnosis not present

## 2021-09-28 DIAGNOSIS — J439 Emphysema, unspecified: Secondary | ICD-10-CM | POA: Diagnosis not present

## 2021-09-28 DIAGNOSIS — J449 Chronic obstructive pulmonary disease, unspecified: Secondary | ICD-10-CM

## 2021-09-28 DIAGNOSIS — B49 Unspecified mycosis: Secondary | ICD-10-CM | POA: Diagnosis not present

## 2021-09-28 DIAGNOSIS — I2510 Atherosclerotic heart disease of native coronary artery without angina pectoris: Secondary | ICD-10-CM | POA: Diagnosis not present

## 2021-09-28 DIAGNOSIS — M1990 Unspecified osteoarthritis, unspecified site: Secondary | ICD-10-CM | POA: Diagnosis not present

## 2021-09-28 DIAGNOSIS — G43909 Migraine, unspecified, not intractable, without status migrainosus: Secondary | ICD-10-CM | POA: Insufficient documentation

## 2021-09-28 DIAGNOSIS — G709 Myoneural disorder, unspecified: Secondary | ICD-10-CM | POA: Insufficient documentation

## 2021-09-28 DIAGNOSIS — F17210 Nicotine dependence, cigarettes, uncomplicated: Secondary | ICD-10-CM | POA: Diagnosis not present

## 2021-09-28 DIAGNOSIS — F418 Other specified anxiety disorders: Secondary | ICD-10-CM | POA: Insufficient documentation

## 2021-09-28 DIAGNOSIS — I10 Essential (primary) hypertension: Secondary | ICD-10-CM

## 2021-09-28 DIAGNOSIS — H8109 Meniere's disease, unspecified ear: Secondary | ICD-10-CM | POA: Diagnosis not present

## 2021-09-28 DIAGNOSIS — E119 Type 2 diabetes mellitus without complications: Secondary | ICD-10-CM | POA: Insufficient documentation

## 2021-09-28 DIAGNOSIS — R911 Solitary pulmonary nodule: Secondary | ICD-10-CM | POA: Diagnosis not present

## 2021-09-28 DIAGNOSIS — Z20822 Contact with and (suspected) exposure to covid-19: Secondary | ICD-10-CM | POA: Insufficient documentation

## 2021-09-28 HISTORY — DX: Type 2 diabetes mellitus without complications: E11.9

## 2021-09-28 HISTORY — DX: Depression, unspecified: F32.A

## 2021-09-28 HISTORY — DX: Personal history of urinary calculi: Z87.442

## 2021-09-28 HISTORY — DX: Essential (primary) hypertension: I10

## 2021-09-28 HISTORY — PX: BRONCHIAL NEEDLE ASPIRATION BIOPSY: SHX5106

## 2021-09-28 HISTORY — PX: BRONCHIAL WASHINGS: SHX5105

## 2021-09-28 HISTORY — PX: BRONCHIAL BIOPSY: SHX5109

## 2021-09-28 LAB — BODY FLUID CELL COUNT WITH DIFFERENTIAL
Eos, Fluid: 2 %
Lymphs, Fluid: 0 %
Monocyte-Macrophage-Serous Fluid: 79 % (ref 50–90)
Neutrophil Count, Fluid: 19 % (ref 0–25)
Total Nucleated Cell Count, Fluid: 79 cu mm (ref 0–1000)

## 2021-09-28 LAB — SARS CORONAVIRUS 2 BY RT PCR: SARS Coronavirus 2 by RT PCR: NEGATIVE

## 2021-09-28 SURGERY — BRONCHOSCOPY, WITH BIOPSY USING ELECTROMAGNETIC NAVIGATION
Anesthesia: General | Laterality: Bilateral

## 2021-09-28 MED ORDER — DEXAMETHASONE SODIUM PHOSPHATE 10 MG/ML IJ SOLN
INTRAMUSCULAR | Status: DC | PRN
  Administered 2021-09-28: 10 mg via INTRAVENOUS

## 2021-09-28 MED ORDER — MIDAZOLAM HCL 2 MG/2ML IJ SOLN
INTRAMUSCULAR | Status: DC | PRN
  Administered 2021-09-28 (×2): 1 mg via INTRAVENOUS

## 2021-09-28 MED ORDER — CHLORHEXIDINE GLUCONATE 0.12 % MT SOLN
OROMUCOSAL | Status: AC
  Filled 2021-09-28: qty 15

## 2021-09-28 MED ORDER — SUGAMMADEX SODIUM 200 MG/2ML IV SOLN
INTRAVENOUS | Status: DC | PRN
  Administered 2021-09-28: 200 mg via INTRAVENOUS

## 2021-09-28 MED ORDER — ROCURONIUM BROMIDE 10 MG/ML (PF) SYRINGE
PREFILLED_SYRINGE | INTRAVENOUS | Status: DC | PRN
  Administered 2021-09-28: 20 mg via INTRAVENOUS
  Administered 2021-09-28: 50 mg via INTRAVENOUS

## 2021-09-28 MED ORDER — LIDOCAINE 2% (20 MG/ML) 5 ML SYRINGE
INTRAMUSCULAR | Status: DC | PRN
  Administered 2021-09-28: 40 mg via INTRAVENOUS

## 2021-09-28 MED ORDER — PREDNISONE 10 MG PO TABS: 10.0000 mg | ORAL_TABLET | Freq: Every day | ORAL | 0 refills | Status: AC

## 2021-09-28 MED ORDER — PHENYLEPHRINE HCL-NACL 20-0.9 MG/250ML-% IV SOLN
INTRAVENOUS | Status: DC | PRN
  Administered 2021-09-28: 15 ug/min via INTRAVENOUS

## 2021-09-28 MED ORDER — LACTATED RINGERS IV SOLN: INTRAVENOUS | Status: DC

## 2021-09-28 MED ORDER — PROPOFOL 10 MG/ML IV BOLUS
INTRAVENOUS | Status: DC | PRN
  Administered 2021-09-28: 100 ug/kg/min via INTRAVENOUS
  Administered 2021-09-28: 60 mg via INTRAVENOUS
  Administered 2021-09-28: 20 mg via INTRAVENOUS

## 2021-09-28 MED ORDER — ONDANSETRON HCL 4 MG/2ML IJ SOLN
INTRAMUSCULAR | Status: DC | PRN
  Administered 2021-09-28: 4 mg via INTRAVENOUS

## 2021-09-28 MED ORDER — FENTANYL CITRATE (PF) 250 MCG/5ML IJ SOLN
INTRAMUSCULAR | Status: DC | PRN
  Administered 2021-09-28: 75 ug via INTRAVENOUS
  Administered 2021-09-28: 25 ug via INTRAVENOUS

## 2021-09-28 NOTE — Op Note (Signed)
Video Bronchoscopy with Robotic Assisted Bronchoscopic Navigation   Date of Operation: 09/28/2021   Pre-op Diagnosis: Pulmonary Nodules  Post-op Diagnosis: Pulmonary Nodules  Surgeon: Walker Shadow  Anesthesia: General endotracheal anesthesia  Operation: Flexible video fiberoptic bronchoscopy with robotic assistance and biopsies.  Estimated Blood Loss: Minimal  Complications: None  Indications and History: Allison Jimenez is a 71 y.o. female with history of smoking, COPD, pulmonary nodules. The risks, benefits, complications, treatment options and expected outcomes were discussed with the patient.  The possibilities of pneumothorax, pneumonia, reaction to medication, pulmonary aspiration, perforation of a viscus, bleeding, failure to diagnose a condition and creating a complication requiring transfusion or operation were discussed with the patient who freely signed the consent.    Description of Procedure: The patient was seen in the Preoperative Area, was examined and was deemed appropriate to proceed.  The patient was taken to [facility] endoscopy room 3, identified as Allison Jimenez and the procedure verified as Flexible Video Fiberoptic Bronchoscopy.  A Time Out was held and the above information confirmed.   Prior to the date of the procedure a high-resolution CT scan of the chest was performed. Utilizing ION software program a virtual tracheobronchial tree was generated to allow the creation of distinct navigation pathways to the patient's parenchymal abnormalities. After being taken to the operating room general anesthesia was initiated and the patient  was orally intubated. The video fiberoptic bronchoscope was introduced via the endotracheal tube and a general inspection was performed which showed normal right and left lung anatomy, aspiration of the bilateral mainstems was completed to remove any remaining secretions. Robotic catheter inserted into patient's endotracheal tube.   Target #1  LUL nodule: The distinct navigation pathways prepared prior to this procedure were then utilized to navigate to patient's lesion identified on CT scan. CIOS imaging was used to aid navigation and confirm ideal location for biopsy. The robotic catheter was secured into place and the vision probe was withdrawn.  Lesion location was approximated using fluoroscopy for peripheral targeting. Under fluoroscopic guidance transbronchial needle brushings, transbronchial needle biopsies, and transbronchial forceps biopsies were performed to be sent for cytology and pathology.   Target #2 LUL apical nodule: The distinct navigation pathways prepared prior to this procedure were then utilized to navigate to patient's lesion identified on CT scan. CIOS imaging was used to aid navigation and confirm ideal location for biopsy.. The robotic catheter was secured into place and the vision probe was withdrawn.  Lesion location was approximated using fluoroscopy for peripheral targeting. Under fluoroscopic guidance transbronchial needle brushings, transbronchial needle biopsies, and transbronchial forceps biopsies were performed to be sent for cytology and pathology.   Target #3 RUL cavitary nodule: The distinct navigation pathways prepared prior to this procedure were then utilized to navigate to patient's lesion identified on CT scan. CIOS imaging was used to aid navigation and confirm ideal location for biopsy.. The robotic catheter was secured into place and the vision probe was withdrawn.  Lesion location was approximated using fluoroscopy for peripheral targeting. Under fluoroscopic guidance transbronchial needle brushings, transbronchial needle biopsies, and transbronchial forceps biopsies were performed to be sent for cytology and pathology.   At the end of the procedure a general airway inspection was performed and there was no evidence of active bleeding. BAL was performed in RUL with 150cc saline instilled and 25cc  return. The bronchoscope was removed.  The patient tolerated the procedure well. There was no significant blood loss and there were no obvious complications. A  post-procedural chest x-ray is pending.  Samples Target #1 LUL nodule: 1. Transbronchial needle brushings from LUL nodule 2. Transbronchial Wang needle biopsies from LUL nodule 3. Transbronchial forceps biopsies from LUL nodule 5. Endobronchial biopsies from LUL nodule  Samples Target #2 LUL apical nodule: 1. Transbronchial needle brushings from LUL apical nodule 2. Transbronchial Wang needle biopsies from LUL apical nodule 3. Transbronchial forceps biopsies from LUL apical nodule 4. Endobronchial biopsies from LUL apical nodule  Samples Target #3 RUL cavitary nodule: 1. Transbronchial needle brushings from RUL cavitary nodule 2. Transbronchial Wang needle biopsies from RUL cavitary nodule 3. Transbronchial forceps biopsies from RUL cavitary nodule 4. Endobronchial biopsies from RUL cavitary nodule  BAL from RUL sent for micro, cyto  Plans:  The patient will be discharged from the PACU to home when recovered from anesthesia and after chest x-ray is reviewed. We will review the cytology, pathology and microbiology results with the patient when they become available. Outpatient followup will be with myself.

## 2021-09-28 NOTE — Interval H&P Note (Signed)
History and Physical Interval Note:  09/28/2021 8:24 AM  Allison Jimenez  has presented today for surgery, with the diagnosis of PULMONARY NODULE.  The various methods of treatment have been discussed with the patient and family. After consideration of risks, benefits and other options for treatment, the patient has consented to  Procedure(s): ROBOTIC ASSISTED NAVIGATIONAL BRONCHOSCOPY (Bilateral) as a surgical intervention.  The patient's history has been reviewed, patient examined, no change in status, stable for surgery.  I have reviewed the patient's chart and labs.  Questions were answered to the patient's satisfaction.     [name]

## 2021-09-28 NOTE — Transfer of Care (Signed)
Immediate Anesthesia Transfer of Care Note  Patient: Allison Jimenez  Procedure(s) Performed: ROBOTIC ASSISTED NAVIGATIONAL BRONCHOSCOPY (Bilateral) BRONCHIAL WASHINGS BRONCHIAL NEEDLE ASPIRATION BIOPSIES BRONCHIAL BIOPSIES  Patient Location: PACU  Anesthesia Type:General  Level of Consciousness: awake, alert  and oriented  Airway & Oxygen Therapy: Patient Spontanous Breathing  Post-op Assessment: Report given to RN and Post -op Vital signs reviewed and stable  Post vital signs: Reviewed and stable  Last Vitals:  Vitals Value Taken Time  BP 127/79 09/28/21 1037  Temp    Pulse 98 09/28/21 1040  Resp 17 09/28/21 1040  SpO2 92 % 09/28/21 1040  Vitals shown include unvalidated device data.  Last Pain:  Vitals:   09/28/21 0710  PainSc: 3       Patients Stated Pain Goal: 3 (60/67/70 3403)  Complications: No notable events documented.

## 2021-09-29 NOTE — Anesthesia Postprocedure Evaluation (Signed)
Anesthesia Post Note  Patient: Allison Jimenez  Procedure(s) Performed: ROBOTIC ASSISTED NAVIGATIONAL BRONCHOSCOPY (Bilateral) BRONCHIAL WASHINGS BRONCHIAL NEEDLE ASPIRATION BIOPSIES BRONCHIAL BIOPSIES     Patient location during evaluation: PACU Anesthesia Type: General Level of consciousness: awake and alert Pain management: pain level controlled Vital Signs Assessment: post-procedure vital signs reviewed and stable Respiratory status: spontaneous breathing, nonlabored ventilation, respiratory function stable and patient connected to nasal cannula oxygen Cardiovascular status: blood pressure returned to baseline and stable Postop Assessment: no apparent nausea or vomiting Anesthetic complications: no   No notable events documented.  Last Vitals:  Vitals:   09/28/21 1103 09/28/21 1115  BP:  127/72  Pulse: 88 85  Resp: 10 14  Temp:    SpO2: 94% 92%    Last Pain:  Vitals:   09/28/21 1037  PainSc: 0-No pain                 [name]

## 2021-09-30 LAB — ACID FAST SMEAR (AFB, MYCOBACTERIA)
Acid Fast Smear: NEGATIVE
Acid Fast Smear: NEGATIVE

## 2021-09-30 LAB — CULTURE, RESPIRATORY W GRAM STAIN: Culture: NO GROWTH

## 2021-10-01 ENCOUNTER — Encounter (HOSPITAL_COMMUNITY): Payer: Self-pay | Admitting: Student

## 2021-10-02 LAB — CYTOLOGY - NON PAP

## 2021-10-03 ENCOUNTER — Telehealth: Payer: Self-pay | Admitting: Student

## 2021-10-03 ENCOUNTER — Other Ambulatory Visit: Payer: Self-pay

## 2021-10-03 DIAGNOSIS — R918 Other nonspecific abnormal finding of lung field: Secondary | ICD-10-CM

## 2021-10-03 LAB — AEROBIC/ANAEROBIC CULTURE W GRAM STAIN (SURGICAL/DEEP WOUND)
Culture: NO GROWTH
Culture: NO GROWTH
Gram Stain: NONE SEEN
Gram Stain: NONE SEEN

## 2021-10-03 MED ORDER — HYDROCODONE-ACETAMINOPHEN 5-325 MG PO TABS: 1.0000 | ORAL_TABLET | Freq: Two times a day (BID) | ORAL | 0 refills | Status: DC | PRN

## 2021-10-03 NOTE — Telephone Encounter (Signed)
Can we postpone her appointment in our clinic for 3-4 months?

## 2021-10-03 NOTE — Telephone Encounter (Signed)
Called and discussed biopsy results. Looks like you may have chronic infection with multiple aspergillus nodules. I have placed referral to infectious disease. Would tentatively plan on repeating CT 8 weeks to 3 months after starting treatment. Can postpone clinic visit with [place] for 3-4 months.

## 2021-10-04 NOTE — Telephone Encounter (Signed)
10/10/2021 appointment canceled- recall placed for 3 months.

## 2021-10-10 ENCOUNTER — Ambulatory Visit: Payer: Medicare PPO | Admitting: Student

## 2021-10-11 ENCOUNTER — Telehealth: Payer: Self-pay

## 2021-10-11 NOTE — Telephone Encounter (Signed)
Allison Jimenez called with concerns about a referral that was made through [facility]. According to [name] she has been rescheduled with several different physicians and she feels like she is being tossed around.  She called their office and mentioned being seen by Dr. Patrice Paradise but he was unable to get her in until mid November.  I suggested that she call back to [facility] and make them aware of her concerns.  She should be able to be seen much earlier with them.  She agreed to do so.

## 2021-10-16 ENCOUNTER — Other Ambulatory Visit: Payer: Self-pay

## 2021-10-16 ENCOUNTER — Ambulatory Visit: Payer: Medicare PPO | Admitting: Internal Medicine

## 2021-10-16 ENCOUNTER — Encounter: Payer: Self-pay | Admitting: Internal Medicine

## 2021-10-16 VITALS — BP 145/81 | HR 95 | Temp 97.9°F | Ht 60.0 in | Wt 123.0 lb

## 2021-10-16 DIAGNOSIS — B449 Aspergillosis, unspecified: Secondary | ICD-10-CM

## 2021-10-16 NOTE — Progress Notes (Addendum)
Patient: Allison Jimenez  DOB: [birth year] MRN: [id number] PCP: Rochel Brome, MD   Patient Active Problem List   Diagnosis Date Noted   Pulmonary nodules    Other fatigue 09/01/2021   Acute pharyngeal candidiasis 09/01/2021   Abnormal TSH 09/01/2021   Other constipation 09/01/2021   Acute left-sided low back pain with right-sided sciatica 07/17/2021   Hyponatremia 07/17/2021   Other elevated white blood cell count 07/17/2021   Urinary retention 07/17/2021   B12 deficiency 07/17/2021   Pharyngitis 07/03/2021   Chronic pharyngeal candidiasis 07/03/2021   Other acute recurrent sinusitis 06/17/2021   Diaphoresis 05/28/2021   Flushes 05/28/2021   Chronic vaginitis 01/21/2021   COPD (chronic obstructive pulmonary disease) ([place]) 01/19/2021   Aneurysm of infrarenal abdominal aorta ([place]) 09/05/2020   Hypothyroidism (acquired) 07/10/2020   Prediabetes 07/10/2020   Ocular migraine with status migrainosus 07/10/2020   Essential hypertension 06/15/2019   Major depressive disorder, recurrent episode, mild ([place]) 09/10/2018   Mixed conductive and sensorineural hearing loss of left ear with restricted hearing of right ear 01/24/2017   Meniere's disease in remission, bilateral 01/24/2017   TOBACCO USER 11/25/2007   Dyslipidemia 06/05/2006   Diverticulosis of colon 06/05/2006     Subjective:  Allison Jimenez is a 71 y.o. female with past medical history, including tobacco abuse, COPD,  as below presents for management of pulmonary aspergillosis.  She had a PET/CT done on 9/12 showing bilateral pulmonary nodule with variable FDG accumulation up to low-level hypermetabolism.  Overall appearance is generally different than on the 08/25/2020 exam.  Noted upper lobe predominance favors atypical infection.  She underwent bronchoscopy with Dr. Verlee Monte on 09/28/21, bacterial(NG), fungal(NG, stain -), AFB cultures(smear -, NG), with path obtained.  Mulitple nodes biopsied with path of right upper lobe via  needle biopsy showed fungal organisms present consistent with Aspergillus.  Imaging progression: Low-dose CT on 05/06/2020 was benign.  Repeat CT 12 months LD CT on 05/12/21 showed new irregular 14.4 mm focus of consolidation right upper lobe, new 4.2 mm solid right middle lobe pulmonary nodule.  Recommended low-dose CT without contrast in 3 months. Low-dose screening CT on 8/25 whosed regression of the previously noted lesion of concern near the apex of the right upper lobe, but multiple new lesions scattered throughout the lungs bilaterally which are highly aggressive in appearance PET on 09/11/21 showed 10 mm left upper lobe nodule , Index cavitary nodule right upper lobe measuring 10 mm , several tiny nodules throughout.   Today patient just denies worsening cough, shortness of breath, hemoptysis.  She denies fevers and chills.  She reports she has been vaping but quit about 2 months ago.  She has been smoking for about 40 years.  Due to some life circumstances she had been smoking up to a pack a day this year but now has cut down to about 12 cigarettes a day. She reports using a walker for back pain.  Past Medical History:  Diagnosis Date   Acute blood loss anemia ([facility]) 09/05/2020   Allergy    ANXIETY 06/05/2006   Arthritis    SHOULDERS    BACK PAIN 01/05/2010   Cataract    bilateral, left worse   Centrilobular emphysema (HCC)    COPD (chronic obstructive pulmonary disease) ([place])    Depression    Diabetes mellitus without complication (HCC)    no meds, diet controllled   DIVERTICULOSIS, COLON 06/05/2006   DIZZINESS OR VERTIGO 06/05/2006   positional   Hearing  loss in left ear    no hearing aid   History of blood transfusion 08/2020   History of kidney stones    passed stones   HYPERLIPIDEMIA 06/05/2006   Hypertension    HYPOKALEMIA 12/14/2008   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   Qualifier: Diagnosis of  By: Burnice Logan  MD, Doretha Sou    Migraine    TOBACCO USER 11/25/2007     Outpatient Medications Prior to Visit  Medication Sig Dispense Refill   acetaminophen (TYLENOL) 500 MG tablet Take 500-1,000 mg by mouth every 6 (six) hours as needed for moderate pain.     albuterol (VENTOLIN HFA) 108 (90 Base) MCG/ACT inhaler Inhale 2 puffs into the lungs every 6 (six) hours as needed for shortness of breath or wheezing.     ALPRAZolam (XANAX) 0.25 MG tablet TAKE 1 TABLET BY MOUTH THREE TIMES DAILY 90 tablet 0   atorvastatin (LIPITOR) 20 MG tablet TAKE 1 TABLET(20 MG) BY MOUTH DAILY 90 tablet 1   Carboxymethylcellul-Glycerin (LUBRICATING EYE DROPS OP) Place 1 drop into both eyes daily as needed (dry eyes).     carvedilol (COREG) 12.5 MG tablet Take 1 tablet (12.5 mg total) by mouth 2 (two) times daily with a meal. 180 tablet 0   cetirizine (ZYRTEC) 10 MG tablet Take 10 mg by mouth daily as needed for allergies.     chlorthalidone (HYGROTON) 25 MG tablet Take 25 mg by mouth daily.     conjugated estrogens (PREMARIN) vaginal cream Place 1 applicator vaginally daily as needed (dryness / burning).     Cyanocobalamin (B-12) 500 MCG TABS TAKE 1 TABLET BY MOUTH DAILY AT NOON 90 tablet 1   cyclobenzaprine (FLEXERIL) 10 MG tablet Take 10 mg by mouth 3 (three) times daily as needed for muscle spasms.     diclofenac Sodium (VOLTAREN) 1 % GEL diclofenac 1 % topical gel (Patient taking differently: Apply 1 Application topically 4 (four) times daily as needed (pain).) 100 g 0   dicyclomine (BENTYL) 20 MG tablet Take 1 tablet (20 mg total) by mouth 2 (two) times daily as needed for spasms. 180 tablet 1   Erenumab-aooe (AIMOVIG) 140 MG/ML SOAJ Inject 140 mg as directed every 30 (thirty) days.     estradiol (ESTRACE) 0.1 MG/GM vaginal cream Place 1 Applicatorful vaginally at bedtime. Daily for 14 days then twice weekly (Patient not taking: Reported on 09/25/2021) 42.5 g 0   famotidine (PEPCID) 40 MG tablet Take 1 tablet (40 mg total) by mouth at bedtime. 30 tablet 5   fluconazole (DIFLUCAN)  200 MG tablet Take 1 tablet (200 mg total) by mouth daily. (Patient not taking: Reported on 09/25/2021) 14 tablet 0   FLUoxetine (PROZAC) 20 MG capsule Take 2 capsules (40 mg total) by mouth daily. 180 capsule 3   fluticasone (FLONASE) 50 MCG/ACT nasal spray SHAKE LIQUID AND USE 2 SPRAYS IN EACH NOSTRIL DAILY 16 g 6   HYDROcodone-acetaminophen (NORCO/VICODIN) 5-325 MG tablet Take 1 tablet by mouth 2 (two) times daily as needed for severe pain (OSteoarthritis of lumbar spine.). 20 tablet 0   ketoconazole (NIZORAL) 2 % cream Apply 1 application. topically daily. (Patient taking differently: Apply 1 application  topically daily as needed (cracked skin).) 15 g 0   linaclotide (LINZESS) 145 MCG CAPS capsule Take 1 capsule (145 mcg total) by mouth daily before breakfast. (Patient taking differently: Take 145 mcg by mouth daily as needed (constipation).) 8 capsule 0   montelukast (SINGULAIR) 10 MG tablet TAKE 1 TABLET(10 MG) BY  MOUTH AT BEDTIME 90 tablet 2   Multiple Vitamins-Minerals (MULTIVITAMIN WITH IRON-MINERALS) liquid Take 15 mLs by mouth daily.     nystatin (MYCOSTATIN) 100000 UNIT/ML suspension SWISH,GARGLE AND SPIT 5 MLS BY MOUTH 4 TIMES A DAY FOR 1 WEEK (Patient taking differently: Use as directed 5 mLs in the mouth or throat 4 (four) times daily as needed (throat irritation).) 473 mL 0   ondansetron (ZOFRAN) 4 MG tablet TAKE 1 TABLET(4 MG) BY MOUTH EVERY 8 HOURS AS NEEDED FOR NAUSEA OR VOMITING 90 tablet 0   pantoprazole (PROTONIX) 40 MG tablet TAKE 1 TABLET(40 MG) BY MOUTH DAILY 90 tablet 0   potassium chloride (KLOR-CON M) 10 MEQ tablet Take 20 mEq by mouth 2 (two) times daily.     promethazine (PHENERGAN) 25 MG tablet TAKE 1 TABLET BY MOUTH EVERY 8 HOURS IF NEEDED 20 tablet 0   rizatriptan (MAXALT) 10 MG tablet Take 1 tablet (10 mg total) by mouth as needed for migraine. May repeat in 2 hours if needed 10 tablet 0   tamsulosin (FLOMAX) 0.4 MG CAPS capsule TAKE 1 CAPSULE(0.4 MG) BY MOUTH AT  BEDTIME (Patient taking differently: Take 0.4 mg by mouth daily as needed (urinary retention due to pain).) 90 capsule 0   Tiotropium Bromide-Olodaterol (STIOLTO RESPIMAT) 2.5-2.5 MCG/ACT AERS Inhale 2 puffs into the lungs daily. 4 g 5   No facility-administered medications prior to visit.     Allergies  Allergen Reactions   Erythromycin Base Diarrhea   Penicillins Rash    Social History   Tobacco Use   Smoking status: Every Day    Packs/day: 1.50    Years: 49.00    Total pack years: 73.50    Types: Cigarettes   Smokeless tobacco: Never   Tobacco comments:    3/4 ppd 08/30/21 [place] Dome, CMA  Vaping Use   Vaping Use: Former   Start date: 01/01/2010   Quit date: 08/01/2021   Substances: Nicotine   Devices: menthol/fruit flavor  Substance Use Topics   Alcohol use: No    Alcohol/week: 0.0 standard drinks of alcohol   Drug use: No    Family History  Problem Relation Age of Onset   Breast cancer Mother    Migraines Other    Depression Other    Anxiety disorder Other    High Cholesterol Other    Colon cancer Neg Hx    Rectal cancer Neg Hx    Stomach cancer Neg Hx    Colon polyps Neg Hx    Esophageal cancer Neg Hx     Objective:  There were no vitals filed for this visit. There is no height or weight on file to calculate BMI.  Physical Exam Constitutional:      Appearance: Normal appearance.  HENT:     Head: Normocephalic and atraumatic.     Right Ear: Tympanic membrane normal.     Left Ear: Tympanic membrane normal.     Nose: Nose normal.     Mouth/Throat:     Mouth: Mucous membranes are moist.  Eyes:     Extraocular Movements: Extraocular movements intact.     Conjunctiva/sclera: Conjunctivae normal.     Pupils: Pupils are equal, round, and reactive to light.  Cardiovascular:     Rate and Rhythm: Normal rate and regular rhythm.     Heart sounds: No murmur heard.    No friction rub. No gallop.  Pulmonary:     Effort: Pulmonary effort is normal.  Breath sounds: Normal breath sounds.  Abdominal:     General: Abdomen is flat.     Palpations: Abdomen is soft.  Musculoskeletal:        General: Normal range of motion.  Skin:    General: Skin is warm and dry.  Neurological:     General: No focal deficit present.     Mental Status: She is alert and oriented to person, place, and time.  Psychiatric:        Mood and Affect: Mood normal.     Lab Results: Lab Results  Component Value Date   WBC 9.9 09/12/2021   HGB 12.5 09/12/2021   HCT 36.3 09/12/2021   MCV 98 (H) 09/12/2021   PLT 303 09/12/2021    Lab Results  Component Value Date   CREATININE 0.46 (L) 09/12/2021   BUN 12 09/12/2021   NA 131 (L) 09/12/2021   K 3.9 09/12/2021   CL 93 (L) 09/12/2021   CO2 25 09/12/2021    Lab Results  Component Value Date   ALT 32 09/12/2021   AST 14 09/12/2021   ALKPHOS 68 09/12/2021   BILITOT 0.4 09/12/2021     Assessment & Plan:  #Aspergillus cavitary nodule #Hx of vaping #tobacco abuse #COPD As part of yearly low-dose CT (patient qualified due to prolonged history of tobacco abuse) found to have irregular focus of consolidation right upper lobe and four-point.  Recommended follow-up 3 months CT low-dose CT on 8/25 showed regression of the previously noted lesion of concern near the apex of the right upper lobe, but multiple new lesions scattered throughout the lungs bilaterally which are highly aggressive in appearance.  As such PET ordered on 09/11/21  which showed 10 mm left upper lobe nodule , Index cavitary nodule right upper lobe measuring 10 mm , several tiny nodules throughout. She underwent bronch with bal and Bx on 9/28 right upper lobe cavitary nodule pathology returning for Aspergillus.  Fungal/AFB cultures have been negative so far. As single cavitary nodule+ aspergillus/3 nodules biopsied (with many nodules on imaging) and no change in respiratory symptoms I think we can monitor lung findings with repeat CT.   I counseled  patient if she develops hemoptysis, shortness of breath, worsening cough from baseline would have to rule out potential to start treatment. Of note she had increased cigarette use over the past few months due to family issues.  She had also been vaping which she has not stopped.  Vaping in itself may contribute to worsening disease. Recommendations: - Follow-up CT in 3 months, Will monitor radiographically for now -Counseled pt to reach out to ID if she develops hemoptysis, worsening cough, SHOB as would have a low threshold to initiate treatment. -Avoid vaping, counseled on smoking cessation -Reached out ot Dr. Melvyn Novas in regards ot imaging. I will order CT chest in 3 months -Labs today including asp Ag -Follow-up with ID in 3 months on  01/08/21. Will need CT prior to the visit.   I have personally spent 75 minutes involved in face-to-face and non-face-to-face activities for this patient on the day of the visit. Professional time spent includes the following activities: Preparing to see the patient (review of tests), Obtaining and/or reviewing separately obtained history (admission/discharge record), Performing a medically appropriate examination and/or evaluation , Ordering medications/tests/procedures, referring and communicating with other health care professionals, Documenting clinical information in the EMR, Independently interpreting results (not separately reported), Communicating results to the patient/family/caregiver, Counseling and educating the patient/family/caregiver and Care coordination (not  separately reported).    Laurice Record, MD [facility]   10/16/21  2:04 PM

## 2021-10-17 ENCOUNTER — Other Ambulatory Visit: Payer: Self-pay

## 2021-10-17 MED ORDER — HYDROCODONE-ACETAMINOPHEN 5-325 MG PO TABS: 1.0000 | ORAL_TABLET | Freq: Two times a day (BID) | ORAL | 0 refills | Status: DC | PRN

## 2021-10-19 DIAGNOSIS — M810 Age-related osteoporosis without current pathological fracture: Secondary | ICD-10-CM | POA: Diagnosis not present

## 2021-10-19 DIAGNOSIS — M5116 Intervertebral disc disorders with radiculopathy, lumbar region: Secondary | ICD-10-CM | POA: Diagnosis not present

## 2021-10-20 LAB — COMPLETE METABOLIC PANEL WITH GFR
AG Ratio: 1.6 (calc) (ref 1.0–2.5)
ALT: 19 U/L (ref 6–29)
AST: 13 U/L (ref 10–35)
Albumin: 4 g/dL (ref 3.6–5.1)
Alkaline phosphatase (APISO): 64 U/L (ref 37–153)
BUN/Creatinine Ratio: 23 (calc) — ABNORMAL HIGH (ref 6–22)
BUN: 11 mg/dL (ref 7–25)
CO2: 32 mmol/L (ref 20–32)
Calcium: 9.6 mg/dL (ref 8.6–10.4)
Chloride: 94 mmol/L — ABNORMAL LOW (ref 98–110)
Creat: 0.48 mg/dL — ABNORMAL LOW (ref 0.60–1.00)
Globulin: 2.5 g/dL (calc) (ref 1.9–3.7)
Glucose, Bld: 109 mg/dL — ABNORMAL HIGH (ref 65–99)
Potassium: 3.2 mmol/L — ABNORMAL LOW (ref 3.5–5.3)
Sodium: 135 mmol/L (ref 135–146)
Total Bilirubin: 0.3 mg/dL (ref 0.2–1.2)
Total Protein: 6.5 g/dL (ref 6.1–8.1)
eGFR: 102 mL/min/{1.73_m2} (ref >=60)

## 2021-10-20 LAB — CBC WITH DIFFERENTIAL/PLATELET
Absolute Monocytes: 888 cells/uL (ref 200–950)
Basophils Absolute: 67 cells/uL (ref 0–200)
Basophils Relative: 0.6 %
Eosinophils Absolute: 155 cells/uL (ref 15–500)
Eosinophils Relative: 1.4 %
HCT: 37.8 % (ref 35.0–45.0)
Hemoglobin: 13.2 g/dL (ref 11.7–15.5)
Lymphs Abs: 3374 cells/uL (ref 850–3900)
MCH: 33 pg (ref 27.0–33.0)
MCHC: 34.9 g/dL (ref 32.0–36.0)
MCV: 94.5 fL (ref 80.0–100.0)
MPV: 8.7 fL (ref 7.5–12.5)
Monocytes Relative: 8 %
Neutro Abs: 6616 cells/uL (ref 1500–7800)
Neutrophils Relative %: 59.6 %
Platelets: 366 10*3/uL (ref 140–400)
RBC: 4 10*6/uL (ref 3.80–5.10)
RDW: 12.4 % (ref 11.0–15.0)
Total Lymphocyte: 30.4 %
WBC: 11.1 10*3/uL — ABNORMAL HIGH (ref 3.8–10.8)

## 2021-10-20 LAB — ASPERGILLUS ANTIGEN,SERUM
Aspergillus Ag, EIA: NOT DETECTED
Index Value: 0.06 (ref <0.50)

## 2021-10-20 LAB — FUNGITELL (1-3)-B-D-GLUCAN
(1-3)-B-D-Glucan: 214 pg/mL — ABNORMAL HIGH (ref <60)
Interpretation: POSITIVE — AB

## 2021-10-23 ENCOUNTER — Ambulatory Visit (INDEPENDENT_AMBULATORY_CARE_PROVIDER_SITE_OTHER): Payer: Medicare PPO | Admitting: Family Medicine

## 2021-10-23 VITALS — BP 148/72 | HR 88 | Temp 96.9°F | Resp 18 | Ht 60.0 in | Wt 123.0 lb

## 2021-10-23 DIAGNOSIS — I10 Essential (primary) hypertension: Secondary | ICD-10-CM

## 2021-10-23 DIAGNOSIS — M8000XD Age-related osteoporosis with current pathological fracture, unspecified site, subsequent encounter for fracture with routine healing: Secondary | ICD-10-CM | POA: Diagnosis not present

## 2021-10-23 DIAGNOSIS — M545 Low back pain, unspecified: Secondary | ICD-10-CM | POA: Diagnosis not present

## 2021-10-23 DIAGNOSIS — E782 Mixed hyperlipidemia: Secondary | ICD-10-CM

## 2021-10-23 DIAGNOSIS — M8000XA Age-related osteoporosis with current pathological fracture, unspecified site, initial encounter for fracture: Secondary | ICD-10-CM | POA: Insufficient documentation

## 2021-10-23 DIAGNOSIS — R7303 Prediabetes: Secondary | ICD-10-CM | POA: Diagnosis not present

## 2021-10-23 DIAGNOSIS — F419 Anxiety disorder, unspecified: Secondary | ICD-10-CM

## 2021-10-23 DIAGNOSIS — R490 Dysphonia: Secondary | ICD-10-CM | POA: Diagnosis not present

## 2021-10-23 DIAGNOSIS — F330 Major depressive disorder, recurrent, mild: Secondary | ICD-10-CM

## 2021-10-23 DIAGNOSIS — Z23 Encounter for immunization: Secondary | ICD-10-CM

## 2021-10-23 NOTE — Progress Notes (Unsigned)
Subjective:  Patient ID: Allison Jimenez, female    DOB: 1950/08/10  Age: 71 y.o. MRN: [id number]  Chief Complaint  Patient presents with   COPD   Prediabetes    HPI Patient is in today for sore throat intermittently and hoarse all the time since July 10th, 2023.  Complaining of sinus congestion. Takes zyrtec and uses flonase. Helps congestion.   Patient is depressed. She is currently taking prozac 20 mg 2 daily. Never started wellbutrin. Rx for alprazolam 0.25 mg three times a day as needed.  Takes whole xanax before bed.  Hypertension: carvedilol 12.5 mg 1/2 every morning and 1 every pm   S/P bronchoscopy done by Dr. Doyle Askew, pulmonology. He referred her to Dr. Candiss Norse, ID, for aspergillus. Not treating. Repeating ct scan in 3 months.   Persistent lumbar back pain: Lumbar MRI (07/18/2021) Sacral insufficiency fractures. Lumbar DDD with moderate lft foraminal narrowing at L4-L5.  Saw Dr. Maia Petties - 2 back injections.  Dr. Maia Petties, who referred to [place]. Patient was supposed to see Dr Harl Bowie, but Dr. Harl Bowie wanted him to see a different MD, but then that MD wanted her to see a different provider.  Patient saw PA at Dr. Henriette Combs office, who then opted to have her see Dr. Patrice Paradise this Wednesday.  I have been prescribing hydrocodone/apap 5/325 mg 1 pill twice a day as needed. Patient is taking 1/2 pill twice daily.   Hyperlipidemia: on lipitor 20 mg once daily before bed. .  Hypertension: on chlorthalidone 25 mg daily and carvedilol 12.5 mg 1/2 in am and 1 at night.  Migraines: Aimovig once monthly. Sees neurology. GERD: on pepcid 40 mg once before bed.  Allergic rhinitis: singulair 10 mg daily, flonase 2 puffs daily.   Current Outpatient Medications on File Prior to Visit  Medication Sig Dispense Refill   diclofenac Sodium (VOLTAREN) 1 % GEL diclofenac 1 % topical gel (Patient taking differently: Apply 1 Application topically 4 (four) times daily as needed (pain).) 100  g 0   acetaminophen (TYLENOL) 500 MG tablet Take 500-1,000 mg by mouth every 6 (six) hours as needed for moderate pain.     albuterol (VENTOLIN HFA) 108 (90 Base) MCG/ACT inhaler Inhale 2 puffs into the lungs every 6 (six) hours as needed for shortness of breath or wheezing. (Patient not taking: Reported on 10/16/2021)     ALPRAZolam (XANAX) 0.25 MG tablet TAKE 1 TABLET BY MOUTH THREE TIMES DAILY 90 tablet 0   atorvastatin (LIPITOR) 20 MG tablet TAKE 1 TABLET(20 MG) BY MOUTH DAILY 90 tablet 1   Carboxymethylcellul-Glycerin (LUBRICATING EYE DROPS OP) Place 1 drop into both eyes daily as needed (dry eyes).     carvedilol (COREG) 12.5 MG tablet Take 1 tablet (12.5 mg total) by mouth 2 (two) times daily with a meal. (Patient taking differently: Take 12.5 mg by mouth 2 (two) times daily with a meal. Taking 1/2 every morning and 2 at night) 180 tablet 0   cetirizine (ZYRTEC) 10 MG tablet Take 10 mg by mouth daily as needed for allergies.     chlorthalidone (HYGROTON) 25 MG tablet Take 25 mg by mouth daily.     conjugated estrogens (PREMARIN) vaginal cream Place 1 applicator vaginally daily as needed (dryness / burning).     Cyanocobalamin (B-12) 500 MCG TABS TAKE 1 TABLET BY MOUTH DAILY AT NOON 90 tablet 1   cyclobenzaprine (FLEXERIL) 10 MG tablet Take 10 mg by mouth 3 (three) times daily as needed for  muscle spasms. (Patient not taking: Reported on 10/16/2021)     dicyclomine (BENTYL) 20 MG tablet Take 1 tablet (20 mg total) by mouth 2 (two) times daily as needed for spasms. 180 tablet 1   Erenumab-aooe (AIMOVIG) 140 MG/ML SOAJ Inject 140 mg as directed every 30 (thirty) days.     estradiol (ESTRACE) 0.1 MG/GM vaginal cream Place 1 Applicatorful vaginally at bedtime. Daily for 14 days then twice weekly 42.5 g 0   famotidine (PEPCID) 40 MG tablet Take 1 tablet (40 mg total) by mouth at bedtime. 30 tablet 5   FLUoxetine (PROZAC) 20 MG capsule Take 2 capsules (40 mg total) by mouth daily. 180 capsule 3    fluticasone (FLONASE) 50 MCG/ACT nasal spray SHAKE LIQUID AND USE 2 SPRAYS IN EACH NOSTRIL DAILY 16 g 6   HYDROcodone-acetaminophen (NORCO/VICODIN) 5-325 MG tablet Take 1 tablet by mouth 2 (two) times daily as needed for severe pain (OSteoarthritis of lumbar spine.). 20 tablet 0   ketoconazole (NIZORAL) 2 % cream Apply 1 application. topically daily. (Patient taking differently: Apply 1 application  topically daily as needed (cracked skin).) 15 g 0   linaclotide (LINZESS) 145 MCG CAPS capsule Take 1 capsule (145 mcg total) by mouth daily before breakfast. (Patient taking differently: Take 145 mcg by mouth daily as needed (constipation).) 8 capsule 0   montelukast (SINGULAIR) 10 MG tablet TAKE 1 TABLET(10 MG) BY MOUTH AT BEDTIME 90 tablet 2   Multiple Vitamins-Minerals (MULTIVITAMIN WITH IRON-MINERALS) liquid Take 15 mLs by mouth daily.     ondansetron (ZOFRAN) 4 MG tablet TAKE 1 TABLET(4 MG) BY MOUTH EVERY 8 HOURS AS NEEDED FOR NAUSEA OR VOMITING 90 tablet 0   pantoprazole (PROTONIX) 40 MG tablet TAKE 1 TABLET(40 MG) BY MOUTH DAILY 90 tablet 0   potassium chloride (KLOR-CON M) 10 MEQ tablet Take 20 mEq by mouth 2 (two) times daily.     promethazine (PHENERGAN) 25 MG tablet TAKE 1 TABLET BY MOUTH EVERY 8 HOURS IF NEEDED 20 tablet 0   rizatriptan (MAXALT) 10 MG tablet Take 1 tablet (10 mg total) by mouth as needed for migraine. May repeat in 2 hours if needed 10 tablet 0   tamsulosin (FLOMAX) 0.4 MG CAPS capsule TAKE 1 CAPSULE(0.4 MG) BY MOUTH AT BEDTIME (Patient not taking: Reported on 10/16/2021) 90 capsule 0   Tiotropium Bromide-Olodaterol (STIOLTO RESPIMAT) 2.5-2.5 MCG/ACT AERS Inhale 2 puffs into the lungs daily. 4 g 5   No current facility-administered medications on file prior to visit.   Past Medical History:  Diagnosis Date   Acute blood loss anemia (ABLA) 09/05/2020   Allergy    ANXIETY 06/05/2006   Arthritis    SHOULDERS    BACK PAIN 01/05/2010   Cataract    bilateral, left worse    Centrilobular emphysema (HCC)    COPD (chronic obstructive pulmonary disease) ([place])    Depression    Diabetes mellitus without complication ([place])    no meds, diet controllled   DIVERTICULOSIS, COLON 06/05/2006   DIZZINESS OR VERTIGO 06/05/2006   positional   Hearing loss in left ear    no hearing aid   History of blood transfusion 08/2020   History of kidney stones    passed stones   HYPERLIPIDEMIA 06/05/2006   Hypertension    HYPOKALEMIA 12/14/2008   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   Qualifier: Diagnosis of  By: Burnice Logan  MD, Doretha Sou    Migraine    TOBACCO USER 11/25/2007   Past Surgical History:  Procedure  Laterality Date   APPENDECTOMY     BRONCHIAL BIOPSY  09/28/2021   Procedure: BRONCHIAL BIOPSIES;  Surgeon: [name], MD;  Location: [facility] ENDOSCOPY;  Service: Pulmonary;;   BRONCHIAL NEEDLE ASPIRATION BIOPSY  09/28/2021   Procedure: BRONCHIAL NEEDLE ASPIRATION BIOPSIES;  Surgeon: [name], MD;  Location: [facility] ENDOSCOPY;  Service: Pulmonary;;   BRONCHIAL WASHINGS  09/28/2021   Procedure: BRONCHIAL WASHINGS;  Surgeon: [name], MD;  Location: [facility] ENDOSCOPY;  Service: Pulmonary;;   CESAREAN SECTION     x1   CHOLECYSTECTOMY     COLONOSCOPY     COSMETIC SURGERY     mastoid surgery  1992   shunt in mastoid- endolymphatic sac in left ear    MRI  07/11/2020   x several, last one located in [facility] ENDOSCOPY     vocal cord surgery     nodule x2    Family History  Problem Relation Age of Onset   Breast cancer Mother    Migraines Other    Depression Other    Anxiety disorder Other    High Cholesterol Other    Colon cancer Neg Hx    Rectal cancer Neg Hx    Stomach cancer Neg Hx    Colon polyps Neg Hx    Esophageal cancer Neg Hx    Social History   Socioeconomic History   Marital status: Divorced    Spouse name: Not on file   Number of children: 1   Years of education: 13   Highest education level: Not on file   Occupational History    Employer: [employer]    Comment: Retired  Tobacco Use   Smoking status: Every Day    Packs/day: 0.50    Years: 49.00    Total pack years: 24.50    Types: Cigarettes   Smokeless tobacco: Never   Tobacco comments:    3/4 ppd 08/30/21 T[place] Dome, CMA  Vaping Use   Vaping Use: Former   Start date: 01/01/2010   Quit date: 08/01/2021   Substances: Nicotine   Devices: menthol/fruit flavor  Substance and Sexual Activity   Alcohol use: No    Alcohol/week: 0.0 standard drinks of alcohol   Drug use: No   Sexual activity: Not Currently    Birth control/protection: Post-menopausal  Other Topics Concern   Not on file  Social History Narrative   Patient lives at home alone. Patient has a Data processing manager. Patient is divorced .   Patient is retired.   Education high school.   Right handed.   Caffeine none.   Social Determinants of Health   Financial Resource Strain: Not on file  Food Insecurity: Not on file  Transportation Needs: Not on file  Physical Activity: Not on file  Stress: Not on file  Social Connections: Not on file    Review of Systems  Constitutional:  Negative for chills, fatigue and fever.  HENT:  Positive for voice change. Negative for congestion, rhinorrhea and sore throat.   Respiratory:  Positive for shortness of breath and wheezing. Negative for cough.   Cardiovascular:  Negative for chest pain.  Gastrointestinal:  Negative for abdominal pain, constipation, diarrhea, nausea and vomiting.  Genitourinary:  Negative for dysuria and urgency.  Musculoskeletal:  Positive for back pain (low back). Negative for myalgias.  Neurological:  Positive for headaches. Negative for dizziness, weakness and light-headedness.  Psychiatric/Behavioral:  Positive for dysphoric mood. The  patient is not nervous/anxious.      Objective:  BP (!) 148/72   Pulse 88   Temp (!) 96.9 F (36.1 C)   Resp 18   Ht 5' (1.524 m)   Wt 123 lb  (55.8 kg)   BMI 24.02 kg/m      10/23/2021   10:22 AM 10/16/2021    2:10 PM 09/28/2021   11:15 AM  BP/Weight  Systolic BP 638 756 433  Diastolic BP 72 81 72  Wt. (Lbs) 123 123   BMI 24.02 kg/m2 24.02 kg/m2     Physical Exam Vitals reviewed.  Constitutional:      Appearance: Normal appearance. She is normal weight.  HENT:     Right Ear: Tympanic membrane, ear canal and external ear normal.     Left Ear: Tympanic membrane, ear canal and external ear normal.     Nose: Congestion and rhinorrhea present.     Mouth/Throat:     Pharynx: Oropharynx is clear. Posterior oropharyngeal erythema (mild) present. No oropharyngeal exudate.  Neck:     Vascular: No carotid bruit.  Cardiovascular:     Rate and Rhythm: Normal rate and regular rhythm.     Heart sounds: Normal heart sounds. No murmur heard. Pulmonary:     Effort: Pulmonary effort is normal. No respiratory distress.     Breath sounds: Normal breath sounds.  Abdominal:     General: Abdomen is flat. Bowel sounds are normal.     Palpations: Abdomen is soft.     Tenderness: There is no abdominal tenderness.  Musculoskeletal:        General: Tenderness (lumbar back) present.  Lymphadenopathy:     Cervical: No cervical adenopathy.  Neurological:     Mental Status: She is alert and oriented to person, place, and time.  Psychiatric:        Mood and Affect: Mood normal.        Behavior: Behavior normal.     Diabetic Foot Exam - Simple   No data filed      Lab Results  Component Value Date   WBC 10.9 (H) 10/23/2021   HGB 12.8 10/23/2021   HCT 37.6 10/23/2021   PLT 428 10/23/2021   GLUCOSE 101 (H) 10/23/2021   CHOL 134 10/23/2021   TRIG 143 10/23/2021   HDL 46 10/23/2021   LDLDIRECT 151.9 09/13/2011   LDLCALC 63 10/23/2021   ALT 17 10/23/2021   AST 17 10/23/2021   NA 132 (L) 10/23/2021   K 3.7 10/23/2021   CL 92 (L) 10/23/2021   CREATININE 0.52 (L) 10/23/2021   BUN 8 10/23/2021   CO2 23 10/23/2021   TSH 1.430  08/28/2021   HGBA1C 6.0 (H) 10/23/2021      Assessment & Plan:   Problem List Items Addressed This Visit       Cardiovascular and Mediastinum   Essential hypertension    increase carvedilol 12.5 mg twice daily        Musculoskeletal and Integument   Pathological fracture due to age-related osteoporosis with routine healing    Order DEXA.      Relevant Orders   VITAMIN D 25 Hydroxy (Vit-D Deficiency, Fractures) (Completed)   DG Bone Density     Other   Anxiety    Decrease xanax to 0.25 mg one twice daily severe anxiety.  Recommend start flexeril 10 mg before bed instead of xanax before bed.       Lumbar back pain    Management per  specialist. Taking hydrocodone/apap 5/325 mg twice a day prn.      Major depressive disorder, recurrent episode, mild (HCC)    The current medical regimen is effective;  continue present plan and medications. prozac to 20 mg twice daily      Prediabetes    Hemoglobin A1c 6.0%, 3 month avg of blood sugars, is in prediabetic range.  In order to prevent progression to diabetes, recommend low carb diet and regular exercise      Relevant Orders   Hemoglobin A1c (Completed)   Hoarseness    Refer to ENT.      Relevant Orders   Ambulatory referral to ENT   Mixed hyperlipidemia    Well controlled.  No changes to medicines.  lipitor 20 mg once daily before bed Continue to work on eating a healthy diet and exercise.  Labs drawn today.       Relevant Orders   CBC with Differential/Platelet (Completed)   Comprehensive metabolic panel (Completed)   Lipid panel (Completed)   VITAMIN D 25 Hydroxy (Vit-D Deficiency, Fractures) (Completed)   Other Visit Diagnoses     Need for influenza vaccination    -  Primary   Relevant Orders   Flu Vaccine QUAD High Dose(Fluad) (Completed)   Need for COVID-19 vaccine       Relevant Orders   Pfizer Fall 2023 Covid-19 Vaccine 35yr and older (Completed)     .  No orders of the defined types were  placed in this encounter.   Orders Placed This Encounter  Procedures   DG Bone Density   [place]23 Covid-19 Vaccine 174yrand older   Flu Vaccine QUAD High Dose(Fluad)   CBC with Differential/Platelet   Comprehensive metabolic panel   Hemoglobin A1c   Lipid panel   VITAMIN D 25 Hydroxy (Vit-D Deficiency, Fractures)   Cardiovascular Risk Assessment   Ambulatory referral to ENT     Follow-up: Return in about 3 months (around 01/23/2022) for chronic follow up.  MaDeatra Robinsonorjas,acting as a scEducation administratoror KiRochel BromeMD.,have documented all relevant documentation on the behalf of KiRochel BromeMD,as directed by  KiRochel BromeMD while in the presence of KiRochel BromeMD.   An After Visit Summary was printed and given to the patient.  KiRochel BromeMD [facility] ([phone]

## 2021-10-23 NOTE — Patient Instructions (Addendum)
Decrease xanax to 0.25 mg one twice daily severe anxiety.  Recommend start flexeril 10 mg before bed instead of xanax before bed.   Hypertension: increase carvedilol 12.5 mg twice daily.   Refer to ENT.

## 2021-10-25 DIAGNOSIS — M810 Age-related osteoporosis without current pathological fracture: Secondary | ICD-10-CM | POA: Diagnosis not present

## 2021-10-25 DIAGNOSIS — M5116 Intervertebral disc disorders with radiculopathy, lumbar region: Secondary | ICD-10-CM | POA: Diagnosis not present

## 2021-10-25 LAB — CBC WITH DIFFERENTIAL/PLATELET
Basophils Absolute: 0.1 10*3/uL (ref 0.0–0.2)
Basos: 1 %
EOS (ABSOLUTE): 0.1 10*3/uL (ref 0.0–0.4)
Eos: 1 %
Hematocrit: 37.6 % (ref 34.0–46.6)
Hemoglobin: 12.8 g/dL (ref 11.1–15.9)
Immature Grans (Abs): 0.1 10*3/uL (ref 0.0–0.1)
Immature Granulocytes: 1 %
Lymphocytes Absolute: 3.7 10*3/uL — ABNORMAL HIGH (ref 0.7–3.1)
Lymphs: 34 %
MCH: 32.2 pg (ref 26.6–33.0)
MCHC: 34 g/dL (ref 31.5–35.7)
MCV: 95 fL (ref 79–97)
Monocytes Absolute: 0.9 10*3/uL (ref 0.1–0.9)
Monocytes: 8 %
Neutrophils Absolute: 5.9 10*3/uL (ref 1.4–7.0)
Neutrophils: 55 %
Platelets: 428 10*3/uL (ref 150–450)
RBC: 3.98 x10E6/uL (ref 3.77–5.28)
RDW: 12.3 % (ref 11.7–15.4)
WBC: 10.9 10*3/uL — ABNORMAL HIGH (ref 3.4–10.8)

## 2021-10-25 LAB — COMPREHENSIVE METABOLIC PANEL
ALT: 17 IU/L (ref 0–32)
AST: 17 IU/L (ref 0–40)
Albumin/Globulin Ratio: 2.1 (ref 1.2–2.2)
Albumin: 4.4 g/dL (ref 3.9–4.9)
Alkaline Phosphatase: 70 IU/L (ref 44–121)
BUN/Creatinine Ratio: 15 (ref 12–28)
BUN: 8 mg/dL (ref 8–27)
Bilirubin Total: <0.2 mg/dL (ref 0.0–1.2)
CO2: 23 mmol/L (ref 20–29)
Calcium: 9.5 mg/dL (ref 8.7–10.3)
Chloride: 92 mmol/L — ABNORMAL LOW (ref 96–106)
Creatinine, Ser: 0.52 mg/dL — ABNORMAL LOW (ref 0.57–1.00)
Globulin, Total: 2.1 g/dL (ref 1.5–4.5)
Glucose: 101 mg/dL — ABNORMAL HIGH (ref 70–99)
Potassium: 3.7 mmol/L (ref 3.5–5.2)
Sodium: 132 mmol/L — ABNORMAL LOW (ref 134–144)
Total Protein: 6.5 g/dL (ref 6.0–8.5)
eGFR: 100 mL/min/{1.73_m2} (ref >59)

## 2021-10-25 LAB — LIPID PANEL
Chol/HDL Ratio: 2.9 ratio (ref 0.0–4.4)
Cholesterol, Total: 134 mg/dL (ref 100–199)
HDL: 46 mg/dL (ref >39)
LDL Chol Calc (NIH): 63 mg/dL (ref 0–99)
Triglycerides: 143 mg/dL (ref 0–149)
VLDL Cholesterol Cal: 25 mg/dL (ref 5–40)

## 2021-10-25 LAB — CARDIOVASCULAR RISK ASSESSMENT

## 2021-10-25 LAB — HEMOGLOBIN A1C
Est. average glucose Bld gHb Est-mCnc: 126 mg/dL
Hgb A1c MFr Bld: 6 % — ABNORMAL HIGH (ref 4.8–5.6)

## 2021-10-25 LAB — VITAMIN D 25 HYDROXY (VIT D DEFICIENCY, FRACTURES): Vit D, 25-Hydroxy: 18.5 ng/mL — ABNORMAL LOW (ref 30.0–100.0)

## 2021-10-28 DIAGNOSIS — R490 Dysphonia: Secondary | ICD-10-CM | POA: Insufficient documentation

## 2021-10-28 DIAGNOSIS — E782 Mixed hyperlipidemia: Secondary | ICD-10-CM | POA: Insufficient documentation

## 2021-10-28 NOTE — Assessment & Plan Note (Signed)
increase carvedilol 12.5 mg twice daily

## 2021-10-28 NOTE — Assessment & Plan Note (Signed)
Management per specialist. Taking hydrocodone/apap 5/325 mg twice a day prn.

## 2021-10-28 NOTE — Assessment & Plan Note (Signed)
Decrease xanax to 0.25 mg one twice daily severe anxiety.  Recommend start flexeril 10 mg before bed instead of xanax before bed.

## 2021-10-28 NOTE — Assessment & Plan Note (Signed)
Refer to ENT

## 2021-10-28 NOTE — Assessment & Plan Note (Addendum)
Order DEXA 

## 2021-10-28 NOTE — Assessment & Plan Note (Addendum)
Hemoglobin A1c 6.0%, 3 month avg of blood sugars, is in prediabetic range.  In order to prevent progression to diabetes, recommend low carb diet and regular exercise 

## 2021-10-28 NOTE — Assessment & Plan Note (Signed)
Well controlled.  No changes to medicines.  lipitor 20 mg once daily before bed Continue to work on eating a healthy diet and exercise.  Labs drawn today.

## 2021-10-28 NOTE — Assessment & Plan Note (Signed)
The current medical regimen is effective;  continue present plan and medications. prozac to 20 mg twice daily

## 2021-10-30 ENCOUNTER — Other Ambulatory Visit: Payer: Self-pay | Admitting: Family Medicine

## 2021-10-30 ENCOUNTER — Encounter: Payer: Self-pay | Admitting: Family Medicine

## 2021-10-30 ENCOUNTER — Telehealth: Payer: Self-pay

## 2021-10-30 DIAGNOSIS — I714 Abdominal aortic aneurysm, without rupture, unspecified: Secondary | ICD-10-CM

## 2021-10-30 LAB — FUNGUS CULTURE RESULT

## 2021-10-30 LAB — FUNGUS CULTURE WITH STAIN

## 2021-10-30 LAB — FUNGAL ORGANISM REFLEX

## 2021-10-30 NOTE — Telephone Encounter (Signed)
Patient called stating that her back doctor recommended she see her Vascular & Vein doctor due to an increase in size of her abdominal aortic aneurysm.  Imaging done 07/18/21 documents it at 3.8 cm and more recent imaging done on 09/11/21 has it at 4.1 cm.  Patient called [facility] as she has seen Dr Stanford Breed in the past - the office told her she would need a referral from her PCP.  Please advise.

## 2021-11-01 ENCOUNTER — Other Ambulatory Visit: Payer: Self-pay

## 2021-11-01 NOTE — Telephone Encounter (Signed)
Patient called requesting refill of Hydrocodone be sent to [facility].  Last refilled 10/17/21 #20

## 2021-11-02 MED ORDER — HYDROCODONE-ACETAMINOPHEN 5-325 MG PO TABS: 1.0000 | ORAL_TABLET | Freq: Two times a day (BID) | ORAL | 0 refills | Status: DC | PRN

## 2021-11-06 ENCOUNTER — Other Ambulatory Visit: Payer: Self-pay

## 2021-11-06 MED ORDER — ALPRAZOLAM 0.25 MG PO TABS: 0.2500 mg | ORAL_TABLET | Freq: Three times a day (TID) | ORAL | 0 refills | Status: DC

## 2021-11-12 LAB — ACID FAST CULTURE WITH REFLEXED SENSITIVITIES (MYCOBACTERIA)
Acid Fast Culture: NEGATIVE
Acid Fast Culture: NEGATIVE

## 2021-11-13 NOTE — Progress Notes (Unsigned)
VASCULAR [facility] VEIN SPECIALISTS OF   ASSESSMENT / PLAN: JAZMYNE BEAUCHESNE is a 71 y.o. female with a {aorticaneurysms:24839} measuring ***mm.  The Joint Council of the [place] [employer] and Society for Vascular Surgery estimates annual rupture risk based on abdominal aneurysm diameter. The patient's estimated risk is {aneurysmrupture:24841}  The patient is *** a candidate for elective repair of the aneurysm to prevent rupture.  I explained the risks / benefits / alternatives to different approaches to aortic reconstruction.   I explained the specific benefits of open repair including improved durability, limited requirement for surveillance, less need for secondary intervention. I explained the specific risks from open repair including higher physiologic stress from aortic cross clamping, higher risk of perioperative complication (including stroke, MI, pneumonia, renal insufficiency and failure, etc.), higher risk of abdominal wall complications, risk of anastomotic pseudoaneurysm.  I explained the specific benefits of endovascular repair including limited physiologic stress and less recovery time, lower risk of serious perioperative complication, zero risk of abdominal wall complication.  I explained the specific risks from endovascular repair including need for lifetime surveillance, risk of large-bore arterial access, risk of requiring secondary intervention to maintain seal or patency. I explained that not all patients are candidates for endovascular repair based on their unique anatomy.  After detailed discussion the patient and I agree that the best option for the patient is ***.   Recommend the following to reduce the risk of major adverse cardiac / limb events.  Complete cessation from all tobacco products. Blood glucose control with goal A1c < 7%. Blood pressure control with goal blood pressure < 140/90 mmHg. Lipid reduction therapy with goal LDL-C <100  mg/dL. Aspirin '81mg'$  PO QD.  Atorvastatin 40-'80mg'$  PO QD (or other "high intensity" statin therapy). *** The addition of ezetimibe or PCSK9 inhibitors may benefit patients with difficult to control hypercholesterolemia.    CHIEF COMPLAINT: ***  HISTORY OF PRESENT ILLNESS: Allison Jimenez is a 71 y.o. female ***  VASCULAR SURGICAL HISTORY: ***  VASCULAR RISK FACTORS: {FINDINGS; POSITIVE NEGATIVE:228-031-0423} history of stroke / transient ischemic attack. {FINDINGS; POSITIVE NEGATIVE:228-031-0423} history of coronary artery disease. *** history of PCI. *** history of CABG.  {FINDINGS; POSITIVE NEGATIVE:228-031-0423} history of diabetes mellitus. Last A1c ***. {FINDINGS; POSITIVE NEGATIVE:228-031-0423} history of smoking. *** actively smoking. {FINDINGS; POSITIVE NEGATIVE:228-031-0423} history of hypertension. *** drug regimen with *** control. {FINDINGS; POSITIVE NEGATIVE:228-031-0423} history of chronic kidney disease.  Last GFR ***. CKD {stage:30421363}. {FINDINGS; POSITIVE NEGATIVE:228-031-0423} history of chronic obstructive pulmonary disease, treated with ***.  FUNCTIONAL STATUS: ECOG performance status: {findings; ecog performance status:31780} Ambulatory status: {TNHAmbulation:25868}  CAREY 1 AND 3 YEAR INDEX Female (2pts) 75-79 or 80-84 (2pts) >84 (3pts) Dependence in toileting (1pt) Partial or full dependence in dressing (1pt) History of malignant neoplasm (2pts) CHF (3pts) COPD (1pts) CKD (3pts)  0-3 pts 6% 1 year mortality ; 21% 3 year mortality 4-5 pts 12% 1 year mortality ; 36% 3 year mortality >5 pts 21% 1 year mortality; 54% 3 year mortality   Past Medical History:  Diagnosis Date   Acute blood loss anemia (ABLA) 09/05/2020   Allergy    ANXIETY 06/05/2006   Arthritis    SHOULDERS    BACK PAIN 01/05/2010   Cataract    bilateral, left worse   Centrilobular emphysema (HCC)    COPD (chronic obstructive pulmonary disease) (HCC)    Depression    Diabetes mellitus without  complication (HCC)    no meds, diet controllled   DIVERTICULOSIS, COLON  06/05/2006   DIZZINESS OR VERTIGO 06/05/2006   positional   Hearing loss in left ear    no hearing aid   History of blood transfusion 08/2020   History of kidney stones    passed stones   HYPERLIPIDEMIA 06/05/2006   Hypertension    HYPOKALEMIA 12/14/2008   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   Qualifier: Diagnosis of  By: Burnice Logan  MD, Doretha Sou    Migraine    TOBACCO USER 11/25/2007    Past Surgical History:  Procedure Laterality Date   APPENDECTOMY     BRONCHIAL BIOPSY  09/28/2021   Procedure: BRONCHIAL BIOPSIES;  Surgeon: [name], MD;  Location: [facility] ENDOSCOPY;  Service: Pulmonary;;   BRONCHIAL NEEDLE ASPIRATION BIOPSY  09/28/2021   Procedure: BRONCHIAL NEEDLE ASPIRATION BIOPSIES;  Surgeon: [name], MD;  Location: [facility] ENDOSCOPY;  Service: Pulmonary;;   BRONCHIAL WASHINGS  09/28/2021   Procedure: BRONCHIAL WASHINGS;  Surgeon: [name], MD;  Location: [facility] ENDOSCOPY;  Service: Pulmonary;;   CESAREAN SECTION     x1   CHOLECYSTECTOMY     COLONOSCOPY     COSMETIC SURGERY     mastoid surgery  1992   shunt in mastoid- endolymphatic sac in left ear    MRI  07/11/2020   x several, last one located in [facility] ENDOSCOPY     vocal cord surgery     nodule x2    Family History  Problem Relation Age of Onset   Breast cancer Mother    Migraines Other    Depression Other    Anxiety disorder Other    High Cholesterol Other    Colon cancer Neg Hx    Rectal cancer Neg Hx    Stomach cancer Neg Hx    Colon polyps Neg Hx    Esophageal cancer Neg Hx     Social History   Socioeconomic History   Marital status: Divorced    Spouse name: Not on file   Number of children: 1   Years of education: 13   Highest education level: Not on file  Occupational History    Employer: [employer]    Comment: Retired  Tobacco Use   Smoking status: Every Day     Packs/day: 0.50    Years: 49.00    Total pack years: 24.50    Types: Cigarettes   Smokeless tobacco: Never   Tobacco comments:    3/4 ppd 08/30/21 Tilden Dome, CMA  Vaping Use   Vaping Use: Former   Start date: 01/01/2010   Quit date: 08/01/2021   Substances: Nicotine   Devices: menthol/fruit flavor  Substance and Sexual Activity   Alcohol use: No    Alcohol/week: 0.0 standard drinks of alcohol   Drug use: No   Sexual activity: Not Currently    Birth control/protection: Post-menopausal  Other Topics Concern   Not on file  Social History Narrative   Patient lives at home alone. Patient has a Data processing manager. Patient is divorced .   Patient is retired.   Education high school.   Right handed.   Caffeine none.   Social Determinants of Health   Financial Resource Strain: Not on file  Food Insecurity: Not on file  Transportation Needs: Not on file  Physical Activity: Not on file  Stress: Not on file  Social Connections: Not on file  Intimate Partner Violence: Not on file    Allergies  Allergen Reactions  Erythromycin Base Diarrhea   Penicillins Rash    Current Outpatient Medications  Medication Sig Dispense Refill   acetaminophen (TYLENOL) 500 MG tablet Take 500-1,000 mg by mouth every 6 (six) hours as needed for moderate pain.     albuterol (VENTOLIN HFA) 108 (90 Base) MCG/ACT inhaler Inhale 2 puffs into the lungs every 6 (six) hours as needed for shortness of breath or wheezing. (Patient not taking: Reported on 10/16/2021)     ALPRAZolam (XANAX) 0.25 MG tablet Take 1 tablet (0.25 mg total) by mouth 3 (three) times daily. 90 tablet 0   atorvastatin (LIPITOR) 20 MG tablet TAKE 1 TABLET(20 MG) BY MOUTH DAILY 90 tablet 1   Carboxymethylcellul-Glycerin (LUBRICATING EYE DROPS OP) Place 1 drop into both eyes daily as needed (dry eyes).     carvedilol (COREG) 12.5 MG tablet Take 1 tablet (12.5 mg total) by mouth 2 (two) times daily with a meal. (Patient taking  differently: Take 12.5 mg by mouth 2 (two) times daily with a meal. Taking 1/2 every morning and 2 at night) 180 tablet 0   cetirizine (ZYRTEC) 10 MG tablet Take 10 mg by mouth daily as needed for allergies.     chlorthalidone (HYGROTON) 25 MG tablet Take 25 mg by mouth daily.     conjugated estrogens (PREMARIN) vaginal cream Place 1 applicator vaginally daily as needed (dryness / burning).     Cyanocobalamin (B-12) 500 MCG TABS TAKE 1 TABLET BY MOUTH DAILY AT NOON 90 tablet 1   cyclobenzaprine (FLEXERIL) 10 MG tablet Take 10 mg by mouth 3 (three) times daily as needed for muscle spasms. (Patient not taking: Reported on 10/16/2021)     diclofenac Sodium (VOLTAREN) 1 % GEL diclofenac 1 % topical gel (Patient taking differently: Apply 1 Application topically 4 (four) times daily as needed (pain).) 100 g 0   dicyclomine (BENTYL) 20 MG tablet Take 1 tablet (20 mg total) by mouth 2 (two) times daily as needed for spasms. 180 tablet 1   Erenumab-aooe (AIMOVIG) 140 MG/ML SOAJ Inject 140 mg as directed every 30 (thirty) days.     estradiol (ESTRACE) 0.1 MG/GM vaginal cream Place 1 Applicatorful vaginally at bedtime. Daily for 14 days then twice weekly 42.5 g 0   famotidine (PEPCID) 40 MG tablet Take 1 tablet (40 mg total) by mouth at bedtime. 30 tablet 5   FLUoxetine (PROZAC) 20 MG capsule Take 2 capsules (40 mg total) by mouth daily. 180 capsule 3   fluticasone (FLONASE) 50 MCG/ACT nasal spray SHAKE LIQUID AND USE 2 SPRAYS IN EACH NOSTRIL DAILY 16 g 6   HYDROcodone-acetaminophen (NORCO/VICODIN) 5-325 MG tablet Take 1 tablet by mouth 2 (two) times daily as needed for severe pain (OSteoarthritis of lumbar spine.). 20 tablet 0   ketoconazole (NIZORAL) 2 % cream Apply 1 application. topically daily. (Patient taking differently: Apply 1 application  topically daily as needed (cracked skin).) 15 g 0   linaclotide (LINZESS) 145 MCG CAPS capsule Take 1 capsule (145 mcg total) by mouth daily before breakfast.  (Patient taking differently: Take 145 mcg by mouth daily as needed (constipation).) 8 capsule 0   montelukast (SINGULAIR) 10 MG tablet TAKE 1 TABLET(10 MG) BY MOUTH AT BEDTIME 90 tablet 2   Multiple Vitamins-Minerals (MULTIVITAMIN WITH IRON-MINERALS) liquid Take 15 mLs by mouth daily.     ondansetron (ZOFRAN) 4 MG tablet TAKE 1 TABLET(4 MG) BY MOUTH EVERY 8 HOURS AS NEEDED FOR NAUSEA OR VOMITING 90 tablet 0   pantoprazole (PROTONIX) 40 MG tablet  TAKE 1 TABLET(40 MG) BY MOUTH DAILY 90 tablet 0   potassium chloride (KLOR-CON M) 10 MEQ tablet Take 20 mEq by mouth 2 (two) times daily.     promethazine (PHENERGAN) 25 MG tablet TAKE 1 TABLET BY MOUTH EVERY 8 HOURS IF NEEDED 20 tablet 0   rizatriptan (MAXALT) 10 MG tablet Take 1 tablet (10 mg total) by mouth as needed for migraine. May repeat in 2 hours if needed 10 tablet 0   tamsulosin (FLOMAX) 0.4 MG CAPS capsule TAKE 1 CAPSULE(0.4 MG) BY MOUTH AT BEDTIME (Patient not taking: Reported on 10/16/2021) 90 capsule 0   Tiotropium Bromide-Olodaterol (STIOLTO RESPIMAT) 2.5-2.5 MCG/ACT AERS Inhale 2 puffs into the lungs daily. 4 g 5   No current facility-administered medications for this visit.    PHYSICAL EXAM There were no vitals filed for this visit.  Constitutional: *** appearing. *** distress. Appears *** nourished.  Neurologic: CN ***. *** focal findings. *** sensory loss. Psychiatric: *** Mood and affect symmetric and appropriate. Eyes: *** No icterus. No conjunctival pallor. Ears, nose, throat: *** mucous membranes moist. Midline trachea.  Cardiac: *** rate and rhythm.  Respiratory: *** unlabored. Abdominal: *** soft, non-tender, non-distended.  Peripheral vascular: *** Extremity: *** edema. *** cyanosis. *** pallor.  Skin: *** gangrene. *** ulceration.  Lymphatic: *** Stemmer's sign. *** palpable lymphadenopathy.    PERTINENT LABORATORY AND RADIOLOGIC DATA  Most recent CBC    Latest Ref Rng & Units 10/23/2021   11:32 AM 10/16/2021     2:44 PM 09/12/2021    2:29 PM  CBC  WBC 3.4 - 10.8 x10E3/uL 10.9  11.1  9.9   Hemoglobin 11.1 - 15.9 g/dL 12.8  13.2  12.5   Hematocrit 34.0 - 46.6 % 37.6  37.8  36.3   Platelets 150 - 450 x10E3/uL 428  366  303      Most recent CMP    Latest Ref Rng & Units 10/23/2021   11:32 AM 10/16/2021    2:44 PM 09/12/2021    2:29 PM  CMP  Glucose 70 - 99 mg/dL 101  109  110   BUN 8 - 27 mg/dL '8  11  12   '$ Creatinine 0.57 - 1.00 mg/dL 0.52  0.48  0.46   Sodium 134 - 144 mmol/L 132  135  131   Potassium 3.5 - 5.2 mmol/L 3.7  3.2  3.9   Chloride 96 - 106 mmol/L 92  94  93   CO2 20 - 29 mmol/L 23  32  25   Calcium 8.7 - 10.3 mg/dL 9.5  9.6  9.7   Total Protein 6.0 - 8.5 g/dL 6.5  6.5  6.2   Total Bilirubin 0.0 - 1.2 mg/dL <0.2  0.3  0.4   Alkaline Phos 44 - 121 IU/L 70   68   AST 0 - 40 IU/L '17  13  14   '$ ALT 0 - 32 IU/L 17  19  32     Renal function CrCl cannot be calculated (Patient's most recent lab result is older than the maximum 21 days allowed.).  Hgb A1c MFr Bld (%)  Date Value  10/23/2021 6.0 (H)    LDL Chol Calc (NIH)  Date Value Ref Range Status  10/23/2021 63 0 - 99 mg/dL Final   Direct LDL  Date Value Ref Range Status  09/13/2011 151.9 mg/dL Final    Comment:    Optimal:  <100 mg/dLNear or Above Optimal:  100-129 mg/dLBorderline High:  130-159 mg/dLHigh:  160-189 mg/dLVery High:  >190 mg/dL     Vascular Imaging: ***  Carleigh Buccieri N. Stanford Breed, MD [facility] Phone Number: [phone] 11/13/2021 8:54 PM   Total time spent on preparing this encounter including chart review, data review, collecting history, examining the patient, coordinating care for this {tnhtimebilling:26202}  Portions of this report may have been transcribed using voice recognition software.  Every effort has been made to ensure accuracy; however, inadvertent computerized transcription errors may still be present.

## 2021-11-14 ENCOUNTER — Ambulatory Visit: Payer: Medicare PPO | Admitting: Vascular Surgery

## 2021-11-14 ENCOUNTER — Encounter: Payer: Self-pay | Admitting: Vascular Surgery

## 2021-11-14 VITALS — BP 124/72 | HR 79 | Temp 98.1°F | Resp 20 | Ht 60.0 in | Wt 125.0 lb

## 2021-11-14 DIAGNOSIS — I7143 Infrarenal abdominal aortic aneurysm, without rupture: Secondary | ICD-10-CM

## 2021-11-15 ENCOUNTER — Other Ambulatory Visit: Payer: Self-pay

## 2021-11-17 ENCOUNTER — Other Ambulatory Visit: Payer: Self-pay

## 2021-11-17 DIAGNOSIS — Z79899 Other long term (current) drug therapy: Secondary | ICD-10-CM | POA: Diagnosis not present

## 2021-11-17 DIAGNOSIS — M5116 Intervertebral disc disorders with radiculopathy, lumbar region: Secondary | ICD-10-CM | POA: Diagnosis not present

## 2021-11-17 DIAGNOSIS — G894 Chronic pain syndrome: Secondary | ICD-10-CM | POA: Diagnosis not present

## 2021-11-17 DIAGNOSIS — Z79891 Long term (current) use of opiate analgesic: Secondary | ICD-10-CM | POA: Diagnosis not present

## 2021-11-17 DIAGNOSIS — I7143 Infrarenal abdominal aortic aneurysm, without rupture: Secondary | ICD-10-CM

## 2021-11-17 DIAGNOSIS — M810 Age-related osteoporosis without current pathological fracture: Secondary | ICD-10-CM | POA: Diagnosis not present

## 2021-11-22 DIAGNOSIS — M5116 Intervertebral disc disorders with radiculopathy, lumbar region: Secondary | ICD-10-CM | POA: Diagnosis not present

## 2021-11-26 ENCOUNTER — Other Ambulatory Visit: Payer: Self-pay | Admitting: Nurse Practitioner

## 2021-11-26 DIAGNOSIS — J0181 Other acute recurrent sinusitis: Secondary | ICD-10-CM

## 2021-11-27 ENCOUNTER — Other Ambulatory Visit: Payer: Self-pay

## 2021-11-27 MED ORDER — PANTOPRAZOLE SODIUM 40 MG PO TBEC: 40.0000 mg | DELAYED_RELEASE_TABLET | Freq: Every day | ORAL | 0 refills | Status: DC

## 2021-11-28 ENCOUNTER — Other Ambulatory Visit: Payer: Self-pay

## 2021-11-28 DIAGNOSIS — R490 Dysphonia: Secondary | ICD-10-CM | POA: Diagnosis not present

## 2021-11-28 DIAGNOSIS — J383 Other diseases of vocal cords: Secondary | ICD-10-CM | POA: Diagnosis not present

## 2021-11-28 DIAGNOSIS — K219 Gastro-esophageal reflux disease without esophagitis: Secondary | ICD-10-CM | POA: Diagnosis not present

## 2021-11-28 DIAGNOSIS — F17200 Nicotine dependence, unspecified, uncomplicated: Secondary | ICD-10-CM | POA: Diagnosis not present

## 2021-11-28 DIAGNOSIS — J310 Chronic rhinitis: Secondary | ICD-10-CM | POA: Diagnosis not present

## 2021-11-28 MED ORDER — CARVEDILOL 12.5 MG PO TABS: 12.5000 mg | ORAL_TABLET | Freq: Two times a day (BID) | ORAL | 0 refills | Status: DC

## 2021-11-28 MED ORDER — POTASSIUM CHLORIDE CRYS ER 10 MEQ PO TBCR: 20.0000 meq | EXTENDED_RELEASE_TABLET | Freq: Two times a day (BID) | ORAL | 0 refills | Status: DC

## 2021-12-05 ENCOUNTER — Other Ambulatory Visit: Payer: Self-pay

## 2021-12-05 MED ORDER — POTASSIUM CHLORIDE CRYS ER 10 MEQ PO TBCR: 20.0000 meq | EXTENDED_RELEASE_TABLET | Freq: Two times a day (BID) | ORAL | 0 refills | Status: DC

## 2021-12-13 DIAGNOSIS — M5116 Intervertebral disc disorders with radiculopathy, lumbar region: Secondary | ICD-10-CM | POA: Diagnosis not present

## 2021-12-13 DIAGNOSIS — M7062 Trochanteric bursitis, left hip: Secondary | ICD-10-CM | POA: Diagnosis not present

## 2021-12-21 NOTE — Progress Notes (Signed)
Acute Office Visit  Subjective:    Patient ID: Allison Jimenez, female    DOB: 01/04/1950, 71 y.o.   MRN: [id number]  CHIEF COMPLAINT: Throat pain and pressure  HPI:  Patient is here with a complain of Congestion, dry throat, denies sinus pain and pressure, her voice is getting worsen, following pulmonology for her, coughing up yellow and greenish phlegm, hurts to swallow, post nasal drip, using Flonase, allergy medicine everyday. He has a history of COPD and emphysema. Denies fever, chills, night sweats or weight loss. Onset of symptoms was a few days ago and rapidly worsening.She is drinking moderate amounts of fluids.  Past history is significant for COPD. Patient is smoker  (half ppd x 40 yrs)   Past Medical History:  Diagnosis Date   Acute blood loss anemia (ABLA) 09/05/2020   Allergy    ANXIETY 06/05/2006   Arthritis    SHOULDERS    BACK PAIN 01/05/2010   Cataract    bilateral, left worse   Centrilobular emphysema (HCC)    COPD (chronic obstructive pulmonary disease) ([place])    Depression    Diabetes mellitus without complication (HCC)    no meds, diet controllled   DIVERTICULOSIS, COLON 06/05/2006   DIZZINESS OR VERTIGO 06/05/2006   positional   Hearing loss in left ear    no hearing aid   History of blood transfusion 08/2020   History of kidney stones    passed stones   HYPERLIPIDEMIA 06/05/2006   Hypertension    HYPOKALEMIA 12/14/2008   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   Qualifier: Diagnosis of  By: Burnice Logan  MD, Doretha Sou    Migraine    TOBACCO USER 11/25/2007    Past Surgical History:  Procedure Laterality Date   APPENDECTOMY     BRONCHIAL BIOPSY  09/28/2021   Procedure: BRONCHIAL BIOPSIES;  Surgeon: [name], MD;  Location: [facility] ENDOSCOPY;  Service: Pulmonary;;   BRONCHIAL NEEDLE ASPIRATION BIOPSY  09/28/2021   Procedure: BRONCHIAL NEEDLE ASPIRATION BIOPSIES;  Surgeon: [name], MD;  Location: [facility] ENDOSCOPY;  Service: Pulmonary;;   BRONCHIAL  WASHINGS  09/28/2021   Procedure: BRONCHIAL WASHINGS;  Surgeon: [name], MD;  Location: [facility] ENDOSCOPY;  Service: Pulmonary;;   CESAREAN SECTION     x1   CHOLECYSTECTOMY     COLONOSCOPY     COSMETIC SURGERY     mastoid surgery  1992   shunt in mastoid- endolymphatic sac in left ear    MRI  07/11/2020   x several, last one located in [facility] ENDOSCOPY     vocal cord surgery     nodule x2    Family History  Problem Relation Age of Onset   Breast cancer Mother    Migraines Other    Depression Other    Anxiety disorder Other    High Cholesterol Other    Colon cancer Neg Hx    Rectal cancer Neg Hx    Stomach cancer Neg Hx    Colon polyps Neg Hx    Esophageal cancer Neg Hx     Social History   Socioeconomic History   Marital status: Divorced    Spouse name: Not on file   Number of children: 1   Years of education: 13   Highest education level: Not on file  Occupational History    Employer: [employer]    Comment: Retired  Tobacco Use   Smoking status: Every  Day    Packs/day: 0.50    Years: 49.00    Total pack years: 24.50    Types: Cigarettes   Smokeless tobacco: Never   Tobacco comments:    3/4 ppd 08/30/21 T[place] Dome, CMA  Vaping Use   Vaping Use: Former   Start date: 01/01/2010   Quit date: 08/01/2021   Substances: Nicotine   Devices: menthol/fruit flavor  Substance and Sexual Activity   Alcohol use: No    Alcohol/week: 0.0 standard drinks of alcohol   Drug use: No   Sexual activity: Not Currently    Birth control/protection: Post-menopausal  Other Topics Concern   Not on file  Social History Narrative   Patient lives at home alone. Patient has a Data processing manager. Patient is divorced .   Patient is retired.   Education high school.   Right handed.   Caffeine none.   Social Determinants of Health   Financial Resource Strain: Not on file  Food Insecurity: Not on file  Transportation Needs: Not on  file  Physical Activity: Not on file  Stress: Not on file  Social Connections: Not on file  Intimate Partner Violence: Not on file    Outpatient Medications Prior to Visit  Medication Sig Dispense Refill   acetaminophen (TYLENOL) 500 MG tablet Take 500-1,000 mg by mouth every 6 (six) hours as needed for moderate pain.     albuterol (VENTOLIN HFA) 108 (90 Base) MCG/ACT inhaler Inhale 2 puffs into the lungs every 6 (six) hours as needed for shortness of breath or wheezing.     ALPRAZolam (XANAX) 0.25 MG tablet Take 1 tablet (0.25 mg total) by mouth 3 (three) times daily. 90 tablet 0   atorvastatin (LIPITOR) 20 MG tablet TAKE 1 TABLET(20 MG) BY MOUTH DAILY 90 tablet 1   Carboxymethylcellul-Glycerin (LUBRICATING EYE DROPS OP) Place 1 drop into both eyes daily as needed (dry eyes).     carvedilol (COREG) 12.5 MG tablet Take 1 tablet (12.5 mg total) by mouth 2 (two) times daily with a meal. 180 tablet 0   cetirizine (ZYRTEC) 10 MG tablet Take 10 mg by mouth daily as needed for allergies.     chlorthalidone (HYGROTON) 25 MG tablet Take 25 mg by mouth daily.     conjugated estrogens (PREMARIN) vaginal cream Place 1 applicator vaginally daily as needed (dryness / burning).     Cyanocobalamin (B-12) 500 MCG TABS TAKE 1 TABLET BY MOUTH DAILY AT NOON 90 tablet 1   cyclobenzaprine (FLEXERIL) 10 MG tablet Take 10 mg by mouth 3 (three) times daily as needed for muscle spasms.     diclofenac Sodium (VOLTAREN) 1 % GEL diclofenac 1 % topical gel (Patient taking differently: Apply 1 Application topically 4 (four) times daily as needed (pain).) 100 g 0   dicyclomine (BENTYL) 20 MG tablet Take 1 tablet (20 mg total) by mouth 2 (two) times daily as needed for spasms. 180 tablet 1   Erenumab-aooe (AIMOVIG) 140 MG/ML SOAJ Inject 140 mg as directed every 30 (thirty) days.     estradiol (ESTRACE) 0.1 MG/GM vaginal cream Place 1 Applicatorful vaginally at bedtime. Daily for 14 days then twice weekly 42.5 g 0    famotidine (PEPCID) 40 MG tablet Take 1 tablet (40 mg total) by mouth at bedtime. 30 tablet 5   FLUoxetine (PROZAC) 20 MG capsule Take 2 capsules (40 mg total) by mouth daily. 180 capsule 3   fluticasone (FLONASE) 50 MCG/ACT nasal spray SHAKE LIQUID AND USE 2 SPRAYS IN  EACH NOSTRIL DAILY 16 g 6   HYDROcodone-acetaminophen (NORCO/VICODIN) 5-325 MG tablet Take 1 tablet by mouth 2 (two) times daily as needed for severe pain (OSteoarthritis of lumbar spine.). 20 tablet 0   ketoconazole (NIZORAL) 2 % cream Apply 1 application. topically daily. (Patient taking differently: Apply 1 application  topically daily as needed (cracked skin).) 15 g 0   linaclotide (LINZESS) 145 MCG CAPS capsule Take 1 capsule (145 mcg total) by mouth daily before breakfast. (Patient taking differently: Take 145 mcg by mouth daily as needed (constipation).) 8 capsule 0   montelukast (SINGULAIR) 10 MG tablet TAKE 1 TABLET(10 MG) BY MOUTH AT BEDTIME 90 tablet 2   Multiple Vitamins-Minerals (MULTIVITAMIN WITH IRON-MINERALS) liquid Take 15 mLs by mouth daily.     ondansetron (ZOFRAN) 4 MG tablet TAKE 1 TABLET(4 MG) BY MOUTH EVERY 8 HOURS AS NEEDED FOR NAUSEA OR VOMITING 90 tablet 0   pantoprazole (PROTONIX) 40 MG tablet Take 1 tablet (40 mg total) by mouth daily. 90 tablet 0   potassium chloride (KLOR-CON M) 10 MEQ tablet Take 2 tablets (20 mEq total) by mouth 2 (two) times daily. 360 tablet 0   promethazine (PHENERGAN) 25 MG tablet TAKE 1 TABLET BY MOUTH EVERY 8 HOURS IF NEEDED 20 tablet 0   rizatriptan (MAXALT) 10 MG tablet Take 1 tablet (10 mg total) by mouth as needed for migraine. May repeat in 2 hours if needed 10 tablet 0   tamsulosin (FLOMAX) 0.4 MG CAPS capsule TAKE 1 CAPSULE(0.4 MG) BY MOUTH AT BEDTIME 90 capsule 0   Tiotropium Bromide-Olodaterol (STIOLTO RESPIMAT) 2.5-2.5 MCG/ACT AERS Inhale 2 puffs into the lungs daily. 4 g 5   No facility-administered medications prior to visit.    Allergies  Allergen Reactions    Erythromycin Base Diarrhea   Penicillins Rash   Review of Systems  Constitutional:  Negative for appetite change, fatigue and fever.  HENT:  Positive for congestion and sore throat. Negative for ear pain and sinus pressure.   Respiratory:  Positive for cough. Negative for chest tightness, shortness of breath and wheezing.   Cardiovascular:  Negative for chest pain and palpitations.  Gastrointestinal:  Negative for abdominal pain, constipation, diarrhea, nausea and vomiting.  Genitourinary:  Negative for dysuria and hematuria.  Musculoskeletal:  Negative for arthralgias, back pain, joint swelling and myalgias.  Skin:  Negative for rash.  Neurological:  Negative for dizziness, weakness and headaches.  Psychiatric/Behavioral:  Negative for dysphoric mood. The patient is not nervous/anxious.        Objective:    Physical Exam Vitals reviewed.  Constitutional:      Appearance: Normal appearance. She is normal weight.  HENT:     Right Ear: Tympanic membrane normal.     Left Ear: Tympanic membrane normal.     Nose: Congestion present.     Mouth/Throat:     Pharynx: Pharyngeal swelling, oropharyngeal exudate and posterior oropharyngeal erythema present. No uvula swelling.     Tonsils: Tonsillar exudate present. 1+ on the right. 1+ on the left.  Cardiovascular:     Rate and Rhythm: Normal rate and regular rhythm.     Heart sounds: Normal heart sounds.  Pulmonary:     Effort: Pulmonary effort is normal.     Breath sounds: Normal breath sounds.  Abdominal:     General: Abdomen is flat. Bowel sounds are normal.     Palpations: Abdomen is soft.  Musculoskeletal:     Cervical back: Full passive range of motion without pain.  Neurological:     Mental Status: She is alert and oriented to person, place, and time.  Psychiatric:        Mood and Affect: Mood normal.        Behavior: Behavior normal.     BP 128/78 (BP Location: Left Arm, Patient Position: Sitting)   Pulse 89   Temp 97.7  F (36.5 C) (Temporal)   Ht 5' (1.524 m)   Wt 121 lb (54.9 kg)   SpO2 98%   BMI 23.63 kg/m  Wt Readings from Last 3 Encounters:  12/22/21 121 lb (54.9 kg)  11/14/21 125 lb (56.7 kg)  10/23/21 123 lb (55.8 kg)    Health Maintenance Due  Topic Date Due   Zoster Vaccines- Shingrix (2 of 2) 06/22/2016   DTaP/Tdap/Td (2 - Td or Tdap) 05/17/2020   COVID-19 Vaccine (6 - 2023-24 season) 12/18/2021    There are no preventive care reminders to display for this patient.   Lab Results  Component Value Date   TSH 1.430 08/28/2021   Lab Results  Component Value Date   WBC 10.9 (H) 10/23/2021   HGB 12.8 10/23/2021   HCT 37.6 10/23/2021   MCV 95 10/23/2021   PLT 428 10/23/2021   Lab Results  Component Value Date   NA 132 (L) 10/23/2021   K 3.7 10/23/2021   CO2 23 10/23/2021   GLUCOSE 101 (H) 10/23/2021   BUN 8 10/23/2021   CREATININE 0.52 (L) 10/23/2021   BILITOT <0.2 10/23/2021   ALKPHOS 70 10/23/2021   AST 17 10/23/2021   ALT 17 10/23/2021   PROT 6.5 10/23/2021   ALBUMIN 4.4 10/23/2021   CALCIUM 9.5 10/23/2021   EGFR 100 10/23/2021   GFR 75.48 07/09/2016   Lab Results  Component Value Date   CHOL 134 10/23/2021   Lab Results  Component Value Date   HDL 46 10/23/2021   Lab Results  Component Value Date   LDLCALC 63 10/23/2021   Lab Results  Component Value Date   TRIG 143 10/23/2021   Lab Results  Component Value Date   CHOLHDL 2.9 10/23/2021   Lab Results  Component Value Date   HGBA1C 6.0 (H) 10/23/2021       Assessment & Plan:   Problem List Items Addressed This Visit       Respiratory   Acute bacterial tonsillitis - Primary    Finish the course of antibiotics Take cough medicine as needed Push through lots of fluids Follow up as needed      Relevant Medications   azithromycin (ZITHROMAX) 250 MG tablet   promethazine-dextromethorphan (PROMETHAZINE-DM) 6.25-15 MG/5ML syrup   Other Visit Diagnoses     Acute cough       Relevant  Orders   POC COVID-19 BinaxNow (Completed)       Follow-up: as needed  I, Stepfanie Yott have reviewed all documentation for this visit. The documentation on 12/22/21   for the exam, diagnosis, procedures, and orders are all accurate and complete.     An After Visit Summary was printed and given to the patient.   Neil Crouch, DNP, [place] [phone]

## 2021-12-22 ENCOUNTER — Ambulatory Visit (INDEPENDENT_AMBULATORY_CARE_PROVIDER_SITE_OTHER): Payer: Medicare PPO | Admitting: Nurse Practitioner

## 2021-12-22 ENCOUNTER — Encounter: Payer: Self-pay | Admitting: Nurse Practitioner

## 2021-12-22 VITALS — BP 128/78 | HR 89 | Temp 97.7°F | Ht 60.0 in | Wt 121.0 lb

## 2021-12-22 DIAGNOSIS — R051 Acute cough: Secondary | ICD-10-CM

## 2021-12-22 DIAGNOSIS — J038 Acute tonsillitis due to other specified organisms: Secondary | ICD-10-CM

## 2021-12-22 DIAGNOSIS — B9689 Other specified bacterial agents as the cause of diseases classified elsewhere: Secondary | ICD-10-CM | POA: Insufficient documentation

## 2021-12-22 LAB — POC COVID19 BINAXNOW: SARS Coronavirus 2 Ag: NEGATIVE

## 2021-12-22 MED ORDER — AZITHROMYCIN 250 MG PO TABS: ORAL_TABLET | ORAL | 0 refills | Status: AC

## 2021-12-22 MED ORDER — PROMETHAZINE-DM 6.25-15 MG/5ML PO SYRP: 5.0000 mL | ORAL_SOLUTION | Freq: Four times a day (QID) | ORAL | 0 refills | Status: DC | PRN

## 2021-12-22 NOTE — Patient Instructions (Addendum)
- Increased rest - Increasing Fluids - Acetaminophen / ibuprofen as needed for fever/pain.  - Salt water gargling, chloraseptic spray and throat lozenges - Mucinex.  - Saline sinus flushes or a neti pot.  - Humidifying the air.    Tonsillitis  Tonsillitis is an infection of the throat. Tonsils are tissues in the back of your throat. This infection causes the tonsils to become red, tender, and swollen. What are the causes? Tonsillitis is caused by germs (bacteria or a virus). This condition can also occur when pieces of food and bacteria build up around the tonsils. Tonsillitis that is caused by germs can spread from person to person. What are the signs or symptoms? A sore throat. Trouble swallowing. White patches on the tonsils. Swollen tonsils. Fever. Headache. Tiredness. Not feeling hungry. Snoring during sleep when you did not snore before. Foul-smelling, yellowish-white pieces of material that you cough up or spit out. These can cause bad breath. How is this treated? Medicines. These can be given to treat pain, swelling, or fever. They can also be given to kill bacteria. Surgery to take out the tonsils. This is done if you have very bad infections that do not go away. Follow these instructions at home: Medicines Take over-the-counter and prescription medicines only as told by your doctor. If you were prescribed an antibiotic medicine, take it as told by your doctor. Do not stop taking the antibiotic even if you start to feel better. Eating and drinking Drink enough fluid to keep your pee (urine) pale yellow. While your throat is sore, eat soft or liquid foods, such as: Soup. Sherbet. Soft, warm cereals, such as oatmeal or hot wheat cereal. Drink warm fluids. Eat frozen ice pops. General instructions Rest as much as you can, and get plenty of sleep. Rinse your mouth often with salt water. To make salt water, dissolve -1 tsp (3-6 g) of salt in 1 cup (237 mL) of warm  water. Do not swallow the salt water. Wash your hands often with soap and water for at least 20 seconds. If there is no soap and water, use hand sanitizer. Do not share cups, bottles, or other utensils until your symptoms are gone. Do not smoke or use any products that contain nicotine or tobacco. If you need help quitting, ask your doctor. Keep all follow-up visits. Contact a doctor if: You have large, tender lumps in your neck that are new. You have a fever that does not go away after 2-3 days. You have a rash. You cough up green, yellow-brown, or bloody fluid. You cannot swallow liquids or food for 24 hours. Only one of your tonsils is swollen. Get help right away if: You have any new symptoms such as: Vomiting. Very bad headache. Stiff neck. Chest pain. Trouble breathing or swallowing. You have very bad throat pain, and you also have drooling or voice changes. You have very bad pain that is not helped by medicine. You cannot fully open your mouth. You have redness, swelling, or very bad pain anywhere in your neck. Summary Tonsillitis is an infection of the throat. It causes your tonsils to be red, tender, and swollen. While your throat is sore, eat soft or liquid foods. Rinse your mouth often with salt water. Do not share cups, bottles, or other utensils until your symptoms are gone. This information is not intended to replace advice given to you by your health care provider. Make sure you discuss any questions you have with your health care provider. Document  Revised: 05/12/2020 Document Reviewed: 05/12/2020 Elsevier Patient Education  [place].

## 2021-12-22 NOTE — Assessment & Plan Note (Signed)
Finish the course of antibiotics Take cough medicine as needed Push through lots of fluids Follow up as needed

## 2021-12-27 ENCOUNTER — Telehealth: Payer: Self-pay

## 2021-12-27 DIAGNOSIS — I2510 Atherosclerotic heart disease of native coronary artery without angina pectoris: Secondary | ICD-10-CM | POA: Diagnosis not present

## 2021-12-27 DIAGNOSIS — R918 Other nonspecific abnormal finding of lung field: Secondary | ICD-10-CM | POA: Diagnosis not present

## 2021-12-27 DIAGNOSIS — B9689 Other specified bacterial agents as the cause of diseases classified elsewhere: Secondary | ICD-10-CM

## 2021-12-27 DIAGNOSIS — J432 Centrilobular emphysema: Secondary | ICD-10-CM | POA: Diagnosis not present

## 2021-12-27 DIAGNOSIS — I700 Atherosclerosis of aorta: Secondary | ICD-10-CM | POA: Diagnosis not present

## 2021-12-27 DIAGNOSIS — J439 Emphysema, unspecified: Secondary | ICD-10-CM | POA: Diagnosis not present

## 2021-12-27 DIAGNOSIS — B449 Aspergillosis, unspecified: Secondary | ICD-10-CM | POA: Diagnosis not present

## 2021-12-27 MED ORDER — CEFDINIR 300 MG PO CAPS: 300.0000 mg | ORAL_CAPSULE | Freq: Two times a day (BID) | ORAL | 0 refills | Status: DC

## 2021-12-27 NOTE — Telephone Encounter (Signed)
Patient seen in office this past Friday, treated with Zithromax, completed yesterday.  Patient states that this has not helped her throat any and it continues to be very painful with white splotches. She is requesting something else be sent in to help with this.

## 2022-01-05 DIAGNOSIS — M5116 Intervertebral disc disorders with radiculopathy, lumbar region: Secondary | ICD-10-CM | POA: Diagnosis not present

## 2022-01-05 DIAGNOSIS — I10 Essential (primary) hypertension: Secondary | ICD-10-CM | POA: Diagnosis not present

## 2022-01-05 DIAGNOSIS — M7062 Trochanteric bursitis, left hip: Secondary | ICD-10-CM | POA: Diagnosis not present

## 2022-01-05 DIAGNOSIS — Z6823 Body mass index (BMI) 23.0-23.9, adult: Secondary | ICD-10-CM | POA: Diagnosis not present

## 2022-01-08 ENCOUNTER — Ambulatory Visit: Payer: Medicare PPO | Admitting: Internal Medicine

## 2022-01-08 ENCOUNTER — Encounter: Payer: Self-pay | Admitting: Internal Medicine

## 2022-01-08 ENCOUNTER — Other Ambulatory Visit: Payer: Self-pay

## 2022-01-08 ENCOUNTER — Telehealth: Payer: Self-pay

## 2022-01-08 ENCOUNTER — Other Ambulatory Visit (HOSPITAL_COMMUNITY): Payer: Self-pay

## 2022-01-08 VITALS — BP 141/77 | HR 79 | Temp 98.1°F | Resp 16 | Wt 123.8 lb

## 2022-01-08 DIAGNOSIS — Z5181 Encounter for therapeutic drug level monitoring: Secondary | ICD-10-CM

## 2022-01-08 DIAGNOSIS — B449 Aspergillosis, unspecified: Secondary | ICD-10-CM

## 2022-01-08 MED ORDER — VORICONAZOLE 200 MG PO TABS: 200.0000 mg | ORAL_TABLET | Freq: Two times a day (BID) | ORAL | 3 refills | Status: DC

## 2022-01-08 NOTE — Telephone Encounter (Signed)
[facility] Patient Advocate Encounter   Received notification from [facility] that prior authorization for Voriconazole is required.   PA submitted on 01/08/22  [place] Status is pending    [facility] will continue to follow.   Ileene Senteno, [facility] Patient [facility] Phone: [phone] Fax:  [phone]

## 2022-01-08 NOTE — Progress Notes (Unsigned)
Patient: Allison Jimenez  DOB: 12/24/1950 MRN: [id number] PCP: Rochel Brome, MD      Patient Active Problem List   Diagnosis Date Noted   Acute bacterial tonsillitis 12/22/2021   Hoarseness 10/28/2021   Mixed hyperlipidemia 10/28/2021   Pathological fracture due to age-related osteoporosis with routine healing 10/23/2021   Pulmonary nodules    Other fatigue 09/01/2021   Acute pharyngeal candidiasis 09/01/2021   Abnormal TSH 09/01/2021   Other constipation 09/01/2021   Acute left-sided low back pain with right-sided sciatica 07/17/2021   Hyponatremia 07/17/2021   Other elevated white blood cell count 07/17/2021   Urinary retention 07/17/2021   B12 deficiency 07/17/2021   Pharyngitis 07/03/2021   Chronic pharyngeal candidiasis 07/03/2021   Other acute recurrent sinusitis 06/17/2021   Diaphoresis 05/28/2021   Flushes 05/28/2021   Chronic vaginitis 01/21/2021   COPD (chronic obstructive pulmonary disease) ([place]) 01/19/2021   Aneurysm of infrarenal abdominal aorta ([place]) 09/05/2020   Hypothyroidism (acquired) 07/10/2020   Prediabetes 07/10/2020   Ocular migraine with status migrainosus 07/10/2020   Essential hypertension 06/15/2019   Major depressive disorder, recurrent episode, mild ([place]) 09/10/2018   Mixed conductive and sensorineural hearing loss of left ear with restricted hearing of right ear 01/24/2017   Meniere's disease in remission, bilateral 01/24/2017   Lumbar back pain 01/05/2010   TOBACCO USER 11/25/2007   Dyslipidemia 06/05/2006   Anxiety 06/05/2006   Diverticulosis of colon 06/05/2006     Subjective:  Allison Jimenez is a 72 y.o. female with past medical history, including tobacco abuse, COPD,  as below presents for management of pulmonary aspergillosis.  She had a PET/CT done on 9/12 showing bilateral pulmonary nodule with variable FDG accumulation up to low-level hypermetabolism.  Overall appearance is generally different than on the 08/25/2020 exam.  Noted  upper lobe predominance favors atypical infection.  She underwent bronchoscopy with Dr. Verlee Monte on 09/28/21, bacterial(NG), fungal(NG, stain -), AFB cultures(smear -, NG), with path obtained.  Mulitple nodes biopsied with path of right upper lobe via needle biopsy showed fungal organisms present consistent with Aspergillus.  Imaging progression: Low-dose CT on 05/06/2020 was benign.  Repeat CT 12 months LD CT on 05/12/21 showed new irregular 14.4 mm focus of consolidation right upper lobe, new 4.2 mm solid right middle lobe pulmonary nodule.  Recommended low-dose CT without contrast in 3 months. Low-dose screening CT on 8/25 whosed regression of the previously noted lesion of concern near the apex of the right upper lobe, but multiple new lesions scattered throughout the lungs bilaterally which are highly aggressive in appearance PET on 09/11/21 showed 10 mm left upper lobe nodule , Index cavitary nodule right upper lobe measuring 10 mm , several tiny nodules throughout.     10/16/21: patient just denies worsening cough, shortness of breath, hemoptysis.  She denies fevers and chills.  She reports she has been vaping but quit about 2 months ago.  She has been smoking for about 40 years.  Due to some life circumstances she had been smoking up to a pack a day this year but now has cut down to about 12 cigarettes a day.She reports using a walker for back pain.   Review of Systems  All other systems reviewed and are negative.   Past Medical History:  Diagnosis Date   Acute blood loss anemia ([facility]) 09/05/2020   Allergy    ANXIETY 06/05/2006   Arthritis    SHOULDERS    BACK PAIN 01/05/2010   Cataract  bilateral, left worse   Centrilobular emphysema (HCC)    COPD (chronic obstructive pulmonary disease) ([place])    Depression    Diabetes mellitus without complication ([place])    no meds, diet controllled   DIVERTICULOSIS, COLON 06/05/2006   DIZZINESS OR VERTIGO 06/05/2006   positional   Hearing loss in  left ear    no hearing aid   History of blood transfusion 08/2020   History of kidney stones    passed stones   HYPERLIPIDEMIA 06/05/2006   Hypertension    HYPOKALEMIA 12/14/2008   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   Qualifier: Diagnosis of  By: Burnice Logan  MD, Doretha Sou    Migraine    TOBACCO USER 11/25/2007    Outpatient Medications Prior to Visit  Medication Sig Dispense Refill   acetaminophen (TYLENOL) 500 MG tablet Take 500-1,000 mg by mouth every 6 (six) hours as needed for moderate pain.     albuterol (VENTOLIN HFA) 108 (90 Base) MCG/ACT inhaler Inhale 2 puffs into the lungs every 6 (six) hours as needed for shortness of breath or wheezing.     ALPRAZolam (XANAX) 0.25 MG tablet Take 1 tablet (0.25 mg total) by mouth 3 (three) times daily. 90 tablet 0   atorvastatin (LIPITOR) 20 MG tablet TAKE 1 TABLET(20 MG) BY MOUTH DAILY 90 tablet 1   Carboxymethylcellul-Glycerin (LUBRICATING EYE DROPS OP) Place 1 drop into both eyes daily as needed (dry eyes).     carvedilol (COREG) 12.5 MG tablet Take 1 tablet (12.5 mg total) by mouth 2 (two) times daily with a meal. 180 tablet 0   cefdinir (OMNICEF) 300 MG capsule Take 1 capsule (300 mg total) by mouth 2 (two) times daily. 20 capsule 0   cetirizine (ZYRTEC) 10 MG tablet Take 10 mg by mouth daily as needed for allergies.     chlorthalidone (HYGROTON) 25 MG tablet Take 25 mg by mouth daily.     conjugated estrogens (PREMARIN) vaginal cream Place 1 applicator vaginally daily as needed (dryness / burning).     Cyanocobalamin (B-12) 500 MCG TABS TAKE 1 TABLET BY MOUTH DAILY AT NOON 90 tablet 1   cyclobenzaprine (FLEXERIL) 10 MG tablet Take 10 mg by mouth 3 (three) times daily as needed for muscle spasms.     diclofenac Sodium (VOLTAREN) 1 % GEL diclofenac 1 % topical gel (Patient taking differently: Apply 1 Application topically 4 (four) times daily as needed (pain).) 100 g 0   dicyclomine (BENTYL) 20 MG tablet Take 1 tablet (20 mg total) by mouth 2  (two) times daily as needed for spasms. 180 tablet 1   Erenumab-aooe (AIMOVIG) 140 MG/ML SOAJ Inject 140 mg as directed every 30 (thirty) days.     estradiol (ESTRACE) 0.1 MG/GM vaginal cream Place 1 Applicatorful vaginally at bedtime. Daily for 14 days then twice weekly 42.5 g 0   famotidine (PEPCID) 40 MG tablet Take 1 tablet (40 mg total) by mouth at bedtime. 30 tablet 5   FLUoxetine (PROZAC) 20 MG capsule Take 2 capsules (40 mg total) by mouth daily. 180 capsule 3   fluticasone (FLONASE) 50 MCG/ACT nasal spray SHAKE LIQUID AND USE 2 SPRAYS IN EACH NOSTRIL DAILY 16 g 6   HYDROcodone-acetaminophen (NORCO/VICODIN) 5-325 MG tablet Take 1 tablet by mouth 2 (two) times daily as needed for severe pain (OSteoarthritis of lumbar spine.). 20 tablet 0   ketoconazole (NIZORAL) 2 % cream Apply 1 application. topically daily. (Patient taking differently: Apply 1 application  topically daily as needed (cracked skin).) 15  g 0   linaclotide (LINZESS) 145 MCG CAPS capsule Take 1 capsule (145 mcg total) by mouth daily before breakfast. (Patient taking differently: Take 145 mcg by mouth daily as needed (constipation).) 8 capsule 0   montelukast (SINGULAIR) 10 MG tablet TAKE 1 TABLET(10 MG) BY MOUTH AT BEDTIME 90 tablet 2   Multiple Vitamins-Minerals (MULTIVITAMIN WITH IRON-MINERALS) liquid Take 15 mLs by mouth daily.     ondansetron (ZOFRAN) 4 MG tablet TAKE 1 TABLET(4 MG) BY MOUTH EVERY 8 HOURS AS NEEDED FOR NAUSEA OR VOMITING 90 tablet 0   pantoprazole (PROTONIX) 40 MG tablet Take 1 tablet (40 mg total) by mouth daily. 90 tablet 0   potassium chloride (KLOR-CON M) 10 MEQ tablet Take 2 tablets (20 mEq total) by mouth 2 (two) times daily. 360 tablet 0   promethazine (PHENERGAN) 25 MG tablet TAKE 1 TABLET BY MOUTH EVERY 8 HOURS IF NEEDED 20 tablet 0   promethazine-dextromethorphan (PROMETHAZINE-DM) 6.25-15 MG/5ML syrup Take 5 mLs by mouth 4 (four) times daily as needed. 118 mL 0   rizatriptan (MAXALT) 10 MG tablet  Take 1 tablet (10 mg total) by mouth as needed for migraine. May repeat in 2 hours if needed 10 tablet 0   tamsulosin (FLOMAX) 0.4 MG CAPS capsule TAKE 1 CAPSULE(0.4 MG) BY MOUTH AT BEDTIME 90 capsule 0   Tiotropium Bromide-Olodaterol (STIOLTO RESPIMAT) 2.5-2.5 MCG/ACT AERS Inhale 2 puffs into the lungs daily. 4 g 5   No facility-administered medications prior to visit.     Allergies  Allergen Reactions   Erythromycin Base Diarrhea   Penicillins Rash    Social History   Tobacco Use   Smoking status: Every Day    Packs/day: 0.50    Years: 49.00    Total pack years: 24.50    Types: Cigarettes   Smokeless tobacco: Never   Tobacco comments:    3/4 ppd 08/30/21 [place], CMA  Vaping Use   Vaping Use: Former   Start date: 01/01/2010   Quit date: 08/01/2021   Substances: Nicotine   Devices: menthol/fruit flavor  Substance Use Topics   Alcohol use: No    Alcohol/week: 0.0 standard drinks of alcohol   Drug use: No    Family History  Problem Relation Age of Onset   Breast cancer Mother    Migraines Other    Depression Other    Anxiety disorder Other    High Cholesterol Other    Colon cancer Neg Hx    Rectal cancer Neg Hx    Stomach cancer Neg Hx    Colon polyps Neg Hx    Esophageal cancer Neg Hx     Objective:  There were no vitals filed for this visit. There is no height or weight on file to calculate BMI.  Physical Exam  Lab Results: Lab Results  Component Value Date   WBC 10.9 (H) 10/23/2021   HGB 12.8 10/23/2021   HCT 37.6 10/23/2021   MCV 95 10/23/2021   PLT 428 10/23/2021    Lab Results  Component Value Date   CREATININE 0.52 (L) 10/23/2021   BUN 8 10/23/2021   NA 132 (L) 10/23/2021   K 3.7 10/23/2021   CL 92 (L) 10/23/2021   CO2 23 10/23/2021    Lab Results  Component Value Date   ALT 17 10/23/2021   AST 17 10/23/2021   ALKPHOS 70 10/23/2021   BILITOT <0.2 10/23/2021     Assessment & Plan:  #Aspergillus cavitary nodule #Hx of  vaping #tobacco  abuse #COPD As part of yearly low-dose CT (patient qualified due to prolonged history of tobacco abuse) found to have irregular focus of consolidation right upper lobe and four-point.  Recommended follow-up 3 months CT low-dose CT on 8/25 showed regression of the previously noted lesion of concern near the apex of the right upper lobe, but multiple new lesions scattered throughout the lungs bilaterally which are highly aggressive in appearance.  As such PET ordered on 09/11/21  which showed 10 mm left upper lobe nodule , Index cavitary nodule right upper lobe measuring 10 mm , several tiny nodules throughout. She underwent bronch with bal and Bx on 9/28 right upper lobe cavitary nodule pathology returning for Aspergillus.  Fungal/AFB cultures have been negative so far. As single cavitary nodule+ aspergillus/3 nodules biopsied (with many nodules on imaging) and no change in respiratory symptoms I think we can monitor lung findings with repeat CT.   I counseled patient if she develops hemoptysis, shortness of breath, worsening cough from baseline would have to rule out potential to start treatment. Of note she had increased cigarette use over the past few months due to family issues.  She had also been vaping which she has not stopped.  Vaping in itself may contribute to worsening disease. CT chest with contrast on 12/26 showed 2 new small nodules, findings similar to most recent prior CT with multiple lung nodules, with aggressive appearance consistent with aspergillosis.  Cavitary right upper lobe nodule noted on prior exam has decreased in size(26m).  Infrarenal abdominal aortic aneurysm measuring 3.1 cm emergency CT on 07/10/2021. Recommendations: startVori 2070msO bid X 3 months with imaging at end of treatment,  Pt plans to get labs next week in [place] F.U on 1/29 with ID for medication monitoring and ekg F/U with pulmonology, pt plans on calling pulm clinic Spoke with  pharmacy  #Smoking cessation -PT is not amenable smoking cessation  I have personally spent `106 minutes involved in face-to-face and non-face-to-face activities for this patient on the day of the visit. Professional time spent includes the following activities: Preparing to see the patient (review of tests), Obtaining and/or reviewing separately obtained history (admission/discharge record), Performing a medically appropriate examination and/or evaluation , Ordering medications/tests/procedures, referring and communicating with other health care professionals, Documenting clinical information in the EMR, Independently interpreting results (not separately reported), Communicating results to the patient/family/caregiver, Counseling and educating the patient/family/caregiver and Care coordination (not separately reported).   MaLaurice RecordMD [facility]   01/08/22  1:11 PM

## 2022-01-09 ENCOUNTER — Other Ambulatory Visit: Payer: Self-pay | Admitting: Family Medicine

## 2022-01-09 ENCOUNTER — Other Ambulatory Visit (HOSPITAL_COMMUNITY): Payer: Self-pay

## 2022-01-09 ENCOUNTER — Telehealth: Payer: Self-pay

## 2022-01-09 LAB — CBC WITH DIFFERENTIAL/PLATELET
Absolute Monocytes: 896 cells/uL (ref 200–950)
Basophils Absolute: 41 cells/uL (ref 0–200)
Basophils Relative: 0.4 %
Eosinophils Absolute: 113 cells/uL (ref 15–500)
Eosinophils Relative: 1.1 %
HCT: 36.9 % (ref 35.0–45.0)
Hemoglobin: 12.5 g/dL (ref 11.7–15.5)
Lymphs Abs: 2462 cells/uL (ref 850–3900)
MCH: 30 pg (ref 27.0–33.0)
MCHC: 33.9 g/dL (ref 32.0–36.0)
MCV: 88.5 fL (ref 80.0–100.0)
MPV: 8.8 fL (ref 7.5–12.5)
Monocytes Relative: 8.7 %
Neutro Abs: 6788 cells/uL (ref 1500–7800)
Neutrophils Relative %: 65.9 %
Platelets: 368 10*3/uL (ref 140–400)
RBC: 4.17 10*6/uL (ref 3.80–5.10)
RDW: 12.4 % (ref 11.0–15.0)
Total Lymphocyte: 23.9 %
WBC: 10.3 10*3/uL (ref 3.8–10.8)

## 2022-01-09 LAB — COMPREHENSIVE METABOLIC PANEL
AG Ratio: 1.5 (calc) (ref 1.0–2.5)
ALT: 25 U/L (ref 6–29)
AST: 15 U/L (ref 10–35)
Albumin: 4.1 g/dL (ref 3.6–5.1)
Alkaline phosphatase (APISO): 60 U/L (ref 37–153)
BUN/Creatinine Ratio: 22 (calc) (ref 6–22)
BUN: 11 mg/dL (ref 7–25)
CO2: 27 mmol/L (ref 20–32)
Calcium: 9.9 mg/dL (ref 8.6–10.4)
Chloride: 94 mmol/L — ABNORMAL LOW (ref 98–110)
Creat: 0.5 mg/dL — ABNORMAL LOW (ref 0.60–1.00)
Globulin: 2.8 g/dL (calc) (ref 1.9–3.7)
Glucose, Bld: 110 mg/dL — ABNORMAL HIGH (ref 65–99)
Potassium: 4 mmol/L (ref 3.5–5.3)
Sodium: 132 mmol/L — ABNORMAL LOW (ref 135–146)
Total Bilirubin: 0.4 mg/dL (ref 0.2–1.2)
Total Protein: 6.9 g/dL (ref 6.1–8.1)

## 2022-01-09 NOTE — Telephone Encounter (Signed)
Thank you Megan.

## 2022-01-09 NOTE — Telephone Encounter (Signed)
[facility] Patient Advocate Encounter  Prior Authorization for Voriconazole has been approved.    PA# [id number] Effective dates: 01/01/21 through 01/01/23  Patients co-pay is $351.80.   [facility] will continue to follow.  Ileene Vanvalkenburgh, [facility] Patient [facility] Phone: [phone] Fax:  [phone]

## 2022-01-09 NOTE — Telephone Encounter (Signed)
Patient called requesting update on PA. Relayed that voriconazole is approved through the end of the year. Provided her with copay cost, she states she has a discount card that can hopefully bring the price down to about $140.  Beryle Flock, RN

## 2022-01-15 ENCOUNTER — Other Ambulatory Visit: Payer: Self-pay | Admitting: Allergy and Immunology

## 2022-01-15 ENCOUNTER — Telehealth: Payer: Self-pay

## 2022-01-15 NOTE — Telephone Encounter (Addendum)
Patient called to report visual changes while on Voriconazole. She is seeing spots and has blurred vision 2 hours after taking this RX. Advised per Dr Candiss Norse that she should STOP the medication until seen on 01/31/22.   If she has new respiratory symptoms or fever before this appointment she should call back before that appt date per Dr Candiss Norse.

## 2022-01-16 ENCOUNTER — Other Ambulatory Visit (HOSPITAL_COMMUNITY): Payer: Self-pay

## 2022-01-17 DIAGNOSIS — M47816 Spondylosis without myelopathy or radiculopathy, lumbar region: Secondary | ICD-10-CM | POA: Diagnosis not present

## 2022-01-25 ENCOUNTER — Ambulatory Visit (INDEPENDENT_AMBULATORY_CARE_PROVIDER_SITE_OTHER): Payer: Medicare PPO | Admitting: Family Medicine

## 2022-01-25 VITALS — BP 110/62 | HR 73 | Temp 97.1°F | Ht 59.0 in | Wt 124.0 lb

## 2022-01-25 DIAGNOSIS — E1159 Type 2 diabetes mellitus with other circulatory complications: Secondary | ICD-10-CM | POA: Diagnosis not present

## 2022-01-25 DIAGNOSIS — F419 Anxiety disorder, unspecified: Secondary | ICD-10-CM | POA: Diagnosis not present

## 2022-01-25 DIAGNOSIS — E559 Vitamin D deficiency, unspecified: Secondary | ICD-10-CM

## 2022-01-25 DIAGNOSIS — R5383 Other fatigue: Secondary | ICD-10-CM

## 2022-01-25 DIAGNOSIS — Z1231 Encounter for screening mammogram for malignant neoplasm of breast: Secondary | ICD-10-CM | POA: Diagnosis not present

## 2022-01-25 DIAGNOSIS — I10 Essential (primary) hypertension: Secondary | ICD-10-CM | POA: Diagnosis not present

## 2022-01-25 DIAGNOSIS — R7303 Prediabetes: Secondary | ICD-10-CM

## 2022-01-25 DIAGNOSIS — F330 Major depressive disorder, recurrent, mild: Secondary | ICD-10-CM

## 2022-01-25 DIAGNOSIS — Z0000 Encounter for general adult medical examination without abnormal findings: Secondary | ICD-10-CM | POA: Insufficient documentation

## 2022-01-25 DIAGNOSIS — I152 Hypertension secondary to endocrine disorders: Secondary | ICD-10-CM

## 2022-01-25 DIAGNOSIS — E782 Mixed hyperlipidemia: Secondary | ICD-10-CM | POA: Diagnosis not present

## 2022-01-25 NOTE — Assessment & Plan Note (Signed)
Mammogram Ordered.

## 2022-01-25 NOTE — Assessment & Plan Note (Signed)
Well controlled.  No changes to medicines. prozac 20 mg 2 daily, alprazolam 0.25 mg three times a day as needed.  Takes whole xanax before bed.

## 2022-01-25 NOTE — Progress Notes (Signed)
Subjective:  Patient ID: Allison Jimenez, female    DOB: December 08, 1950  Age: 72 y.o. MRN: [id number]  Chief Complaint  Patient presents with   Hypertension    HPI Patient is here today for 3 Month follow up.  GAD: prozac 20 mg 2 daily, alprazolam 0.25 mg three times a day as needed.  Takes whole xanax before bed.   Hypertension: on chlorthalidone 25 mg daily and carvedilol 12.5 mg one twice daily.  Persistent lumbar back pain: hydrocodone/apap 5/325 mg 1 pill twice a day as needed. Patient is taking 1/2 pill twice daily. Given by Dr. Towanda Malkin office.  Takes tylenol  Hyperlipidemia: on lipitor 20 mg once daily before bed. .  Migraines: Aimovig once monthly. Sees neurology. GERD: on pepcid 40 mg once before bed. Protonix 40 mg daily Allergic rhinitis: singulair 10 mg daily, flonase 2 puffs daily.   Patient had aspergillis in lungs and was given voriconazole, but did not tolerate it. Had visual changes. She is scheduled to return to see pulmonology and infectious disease.   Diabetes: last A1c was 6.Not eating healthy. Unable to exercise due to foot and back pain.  Not checking sugars.      01/25/2022   10:26 AM 01/08/2022    2:23 PM 10/23/2021   10:33 AM 08/16/2021    2:13 PM 04/20/2021   10:19 AM  Depression screen PHQ 2/9  Decreased Interest  0 0 0 0  Down, Depressed, Hopeless 2 0 1 1 0  PHQ - 2 Score 2 0 1 1 0  Altered sleeping 0  1  1  Tired, decreased energy '1  1  1  '$ Change in appetite 0  0  0  Feeling bad or failure about yourself  0  0  0  Trouble concentrating 0  0  0  Moving slowly or fidgety/restless 0  0  1  Suicidal thoughts 0  0  0  PHQ-9 Score '3  3  3  '$ Difficult doing work/chores Not difficult at all  Not difficult at all           09/28/2021    7:13 AM 10/16/2021    2:13 PM 10/23/2021   10:32 AM 01/08/2022    2:23 PM 01/25/2022   10:26 AM  Fall Risk  Falls in the past year?  0 0 0 0  Was there an injury with Fall?   0 0 0  Fall Risk Category Calculator   0 0  0  Fall Risk Category (Retired)   Low Low   (RETIRED) Patient Fall Risk Level Low fall risk Moderate fall risk Low fall risk Moderate fall risk   Patient at Risk for Falls Due to  Impaired balance/gait  Impaired balance/gait No Fall Risks  Fall risk Follow up  Falls evaluation completed Falls evaluation completed Falls evaluation completed Falls evaluation completed      Review of Systems  Constitutional:  Negative for fatigue.  HENT:  Negative for congestion, ear pain and sore throat.   Respiratory:  Negative for cough and shortness of breath.   Cardiovascular:  Negative for chest pain.  Gastrointestinal:  Negative for abdominal pain, constipation, diarrhea, nausea and vomiting.  Genitourinary:  Negative for dysuria, frequency and urgency.  Musculoskeletal:  Positive for arthralgias and back pain. Negative for myalgias.  Neurological:  Positive for headaches. Negative for dizziness.  Psychiatric/Behavioral:  Negative for agitation and sleep disturbance. The patient is not nervous/anxious.     Current Outpatient Medications on File  Prior to Visit  Medication Sig Dispense Refill   acetaminophen (TYLENOL) 500 MG tablet Take 500-1,000 mg by mouth every 6 (six) hours as needed for moderate pain.     albuterol (VENTOLIN HFA) 108 (90 Base) MCG/ACT inhaler Inhale 2 puffs into the lungs every 6 (six) hours as needed for shortness of breath or wheezing.     ALPRAZolam (XANAX) 0.25 MG tablet TAKE 1 TABLET(0.25 MG) BY MOUTH THREE TIMES DAILY 90 tablet 0   atorvastatin (LIPITOR) 20 MG tablet TAKE 1 TABLET(20 MG) BY MOUTH DAILY 90 tablet 1   Carboxymethylcellul-Glycerin (LUBRICATING EYE DROPS OP) Place 1 drop into both eyes daily as needed (dry eyes).     carvedilol (COREG) 12.5 MG tablet Take 1 tablet (12.5 mg total) by mouth 2 (two) times daily with a meal. 180 tablet 0   cetirizine (ZYRTEC) 10 MG tablet Take 10 mg by mouth daily as needed for allergies.     chlorthalidone (HYGROTON) 25 MG tablet  Take 25 mg by mouth daily.     conjugated estrogens (PREMARIN) vaginal cream Place 1 applicator vaginally daily as needed (dryness / burning).     Cyanocobalamin (B-12) 500 MCG TABS TAKE 1 TABLET BY MOUTH DAILY AT NOON 90 tablet 1   cyclobenzaprine (FLEXERIL) 10 MG tablet Take 10 mg by mouth 3 (three) times daily as needed for muscle spasms.     diclofenac Sodium (VOLTAREN) 1 % GEL diclofenac 1 % topical gel (Patient taking differently: Apply 1 Application topically 4 (four) times daily as needed (pain).) 100 g 0   dicyclomine (BENTYL) 20 MG tablet Take 1 tablet (20 mg total) by mouth 2 (two) times daily as needed for spasms. 180 tablet 1   Erenumab-aooe (AIMOVIG) 140 MG/ML SOAJ Inject 140 mg as directed every 30 (thirty) days.     estradiol (ESTRACE) 0.1 MG/GM vaginal cream Place 1 Applicatorful vaginally at bedtime. Daily for 14 days then twice weekly 42.5 g 0   famotidine (PEPCID) 40 MG tablet TAKE 1 TABLET(40 MG) BY MOUTH AT BEDTIME 30 tablet 0   FLUoxetine (PROZAC) 20 MG capsule Take 2 capsules (40 mg total) by mouth daily. 180 capsule 3   fluticasone (FLONASE) 50 MCG/ACT nasal spray SHAKE LIQUID AND USE 2 SPRAYS IN EACH NOSTRIL DAILY 16 g 6   HYDROcodone-acetaminophen (NORCO/VICODIN) 5-325 MG tablet Take 1 tablet by mouth 2 (two) times daily as needed for severe pain (OSteoarthritis of lumbar spine.). 20 tablet 0   ketoconazole (NIZORAL) 2 % cream Apply 1 application. topically daily. (Patient taking differently: Apply 1 application  topically daily as needed (cracked skin).) 15 g 0   linaclotide (LINZESS) 145 MCG CAPS capsule Take 1 capsule (145 mcg total) by mouth daily before breakfast. (Patient taking differently: Take 145 mcg by mouth daily as needed (constipation).) 8 capsule 0   montelukast (SINGULAIR) 10 MG tablet TAKE 1 TABLET(10 MG) BY MOUTH AT BEDTIME 90 tablet 2   Multiple Vitamins-Minerals (MULTIVITAMIN WITH IRON-MINERALS) liquid Take 15 mLs by mouth daily.     ondansetron (ZOFRAN)  4 MG tablet TAKE 1 TABLET(4 MG) BY MOUTH EVERY 8 HOURS AS NEEDED FOR NAUSEA OR VOMITING 90 tablet 0   pantoprazole (PROTONIX) 40 MG tablet Take 1 tablet (40 mg total) by mouth daily. 90 tablet 0   potassium chloride (KLOR-CON M) 10 MEQ tablet Take 2 tablets (20 mEq total) by mouth 2 (two) times daily. 360 tablet 0   promethazine (PHENERGAN) 25 MG tablet TAKE 1 TABLET BY MOUTH EVERY 8  HOURS IF NEEDED 20 tablet 0   rizatriptan (MAXALT) 10 MG tablet Take 1 tablet (10 mg total) by mouth as needed for migraine. May repeat in 2 hours if needed 10 tablet 0   Tiotropium Bromide-Olodaterol (STIOLTO RESPIMAT) 2.5-2.5 MCG/ACT AERS Inhale 2 puffs into the lungs daily. 4 g 5   No current facility-administered medications on file prior to visit.   Past Medical History:  Diagnosis Date   Acute blood loss anemia (ABLA) 09/05/2020   Allergy    ANXIETY 06/05/2006   Arthritis    SHOULDERS    BACK PAIN 01/05/2010   Cataract    bilateral, left worse   Centrilobular emphysema (HCC)    COPD (chronic obstructive pulmonary disease) ([place])    Depression    Diabetes mellitus without complication (HCC)    no meds, diet controllled   DIVERTICULOSIS, COLON 06/05/2006   DIZZINESS OR VERTIGO 06/05/2006   positional   Hearing loss in left ear    no hearing aid   History of blood transfusion 08/2020   History of kidney stones    passed stones   HYPERLIPIDEMIA 06/05/2006   Hypertension    HYPOKALEMIA 12/14/2008   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 ENDOSCOPY;  Service: Pulmonary;;   BRONCHIAL NEEDLE ASPIRATION BIOPSY  09/28/2021   Procedure: BRONCHIAL NEEDLE ASPIRATION BIOPSIES;  Surgeon: [name], MD;  Location: [facility] ENDOSCOPY;   Service: Pulmonary;;   BRONCHIAL WASHINGS  09/28/2021   Procedure: BRONCHIAL WASHINGS;  Surgeon: [name], MD;  Location: [facility] ENDOSCOPY;  Service: Pulmonary;;   CESAREAN SECTION     x1   CHOLECYSTECTOMY     COLONOSCOPY     COSMETIC SURGERY     mastoid surgery  1992   shunt in mastoid- endolymphatic sac in left ear    MRI  07/11/2020   x several, last one located in [facility] END[facility]COPY     vocal cord surgery     nodule x2    Family History  Problem Relation Age of Onset   Breast cancer Mother    Migraines Other    Depression Other    Anxiety disorder Other    High Cholesterol Other    Colon cancer Neg Hx    Rectal cancer Neg Hx    Stomach cancer Neg Hx    Colon polyps Neg Hx    Esophageal cancer Neg Hx    Social History   Socioeconomic History   Marital status: Divorced    Spouse name: Not on file   Number of children: 1   Years of education: 13   Highest education level: Not on file  Occupational History    Employer: [employer]    Comment: Retired  Tobacco Use   Smoking status: Every Day    Packs/day: 0.50    Years: 49.00    Total pack years: 24.50    Types: Cigarettes   Smokeless tobacco: Never   Tobacco comments:    3/4 ppd 08/30/21 [place] Dome, CMA  Vaping Use   Vaping Use: Former   Start date: 01/01/2010   Quit date: 08/01/2021   Substances: Nicotine  Devices: menthol/fruit flavor  Substance and Sexual Activity   Alcohol use: No    Alcohol/week: 0.0 standard drinks of alcohol   Drug use: No   Sexual activity: Not Currently    Birth control/protection: Post-menopausal  Other Topics Concern   Not on file  Social History Narrative   Patient lives at home alone. Patient has a Data processing manager. Patient is divorced .   Patient is retired.   Education high school.   Right handed.   Caffeine none.   Social Determinants of Health   Financial Resource Strain: Not on file  Food Insecurity: Not on file   Transportation Needs: Not on file  Physical Activity: Not on file  Stress: Not on file  Social Connections: Not on file    Objective:  BP 110/62 (BP Location: Right Arm, Patient Position: Sitting, Cuff Size: Small)   Pulse 73   Temp (!) 97.1 F (36.2 C) (Temporal)   Ht '4\' 11"'$  (1.499 m)   Wt 124 lb (56.2 kg)   SpO2 98%   BMI 25.04 kg/m      01/25/2022   10:24 AM 01/08/2022    2:20 PM 12/22/2021   10:20 AM  BP/Weight  Systolic BP 440 102 725  Diastolic BP 62 77 78  Wt. (Lbs) 124 123.8 121  BMI 25.04 kg/m2 24.18 kg/m2 23.63 kg/m2    Physical Exam Vitals reviewed.  Constitutional:      Appearance: Normal appearance.  Neck:     Vascular: No carotid bruit.  Cardiovascular:     Rate and Rhythm: Normal rate and regular rhythm.     Heart sounds: Normal heart sounds.  Pulmonary:     Effort: Pulmonary effort is normal.     Breath sounds: Normal breath sounds. No wheezing.  Abdominal:     General: Bowel sounds are normal.     Palpations: Abdomen is soft.     Tenderness: There is no abdominal tenderness.  Musculoskeletal:     Cervical back: Normal range of motion.  Neurological:     Mental Status: She is alert and oriented to person, place, and time.  Psychiatric:        Mood and Affect: Mood normal.        Behavior: Behavior normal.     Diabetic Foot Exam - Simple   Simple Foot Form  01/25/2022  7:47 PM  Visual Inspection No deformities, no ulcerations, no other skin breakdown bilaterally: Yes Sensation Testing Intact to touch and monofilament testing bilaterally: Yes Pulse Check Posterior Tibialis and Dorsalis pulse intact bilaterally: Yes Comments      Lab Results  Component Value Date   WBC 10.3 01/25/2022   HGB 12.9 01/25/2022   HCT 38.4 01/25/2022   PLT 375 01/25/2022   GLUCOSE 90 01/25/2022   CHOL 144 01/25/2022   TRIG 145 01/25/2022   HDL 54 01/25/2022   LDLDIRECT 151.9 09/13/2011   LDLCALC 65 01/25/2022   ALT 20 01/25/2022   AST 16 01/25/2022    NA 133 (L) 01/25/2022   K 3.8 01/25/2022   CL 90 (L) 01/25/2022   CREATININE 0.53 (L) 01/25/2022   BUN 10 01/25/2022   CO2 26 01/25/2022   TSH 2.620 01/25/2022   HGBA1C 6.4 (H) 01/25/2022      Assessment & Plan:    Mixed hyperlipidemia Assessment & Plan: Well controlled.  No changes to medicines.  Continue to work on eating a healthy diet and exercise.  Labs drawn today.    Orders: -  Lipid panel  Major depressive disorder, recurrent episode, mild ([place]) Assessment & Plan: Well controlled.  No changes to medicines. prozac 20 mg 2 daily, alprazolam 0.25 mg three times a day as needed.  Takes whole xanax before bed.     Anxiety Assessment & Plan: Well controlled.  No changes to medicines. prozac 20 mg 2 daily, alprazolam 0.25 mg three times a day as needed.  Takes whole xanax before bed.      Hypertension complicating diabetes ([place]) Assessment & Plan: Control: good Recommend check feet daily. Recommend annual eye exams. Medicines: none Continue to work on eating a healthy diet and exercise.  Labs drawn today.     Orders: -     Comprehensive metabolic panel -     Hemoglobin A1c -     CBC With Diff/Platelet -     Microalbumin / creatinine urine ratio -     Cardiovascular Risk Assessment  Other fatigue Assessment & Plan: Check labs   Vitamin D insufficiency Assessment & Plan: Labs done today  Orders: -     VITAMIN D 25 Hydroxy (Vit-D Deficiency, Fractures) -     T4, free -     TSH  Visit for screening mammogram Assessment & Plan: Mammogram Ordered.  Orders: -     Digital Screening Mammogram, Left and Right; Future     No orders of the defined types were placed in this encounter.   Orders Placed This Encounter  Procedures   MM DIGITAL SCREENING BILATERAL   Comprehensive metabolic panel   Hemoglobin A1c   CBC With Diff/Platelet   Lipid panel   Vitamin D, 25-hydroxy   Microalbumin / creatinine urine ratio   T4, free   TSH    Cardiovascular Risk Assessment     Follow-up: Return in about 3 months (around 04/26/2022) for chronic fasting.  An After Visit Summary was printed and given to the patient.  Rochel Brome, MD [facility] [phone]

## 2022-01-25 NOTE — Assessment & Plan Note (Signed)
Labs done today.

## 2022-01-25 NOTE — Assessment & Plan Note (Signed)
Well controlled.  ?No changes to medicines.  ?Continue to work on eating a healthy diet and exercise.  ?Labs drawn today.  ?

## 2022-01-25 NOTE — Assessment & Plan Note (Signed)
No changes to medicines. chlorthalidone 25 mg daily and carvedilol 12.5 mg one twice daily. Continue to work on eating a healthy diet and exercise.  Labs drawn today.

## 2022-01-27 LAB — CARDIOVASCULAR RISK ASSESSMENT

## 2022-01-27 LAB — COMPREHENSIVE METABOLIC PANEL
ALT: 20 IU/L (ref 0–32)
AST: 16 IU/L (ref 0–40)
Albumin/Globulin Ratio: 1.8 (ref 1.2–2.2)
Albumin: 4.4 g/dL (ref 3.9–4.9)
Alkaline Phosphatase: 72 IU/L (ref 44–121)
BUN/Creatinine Ratio: 19 (ref 12–28)
BUN: 10 mg/dL (ref 8–27)
Bilirubin Total: 0.4 mg/dL (ref 0.0–1.2)
CO2: 26 mmol/L (ref 20–29)
Calcium: 9.9 mg/dL (ref 8.7–10.3)
Chloride: 90 mmol/L — ABNORMAL LOW (ref 96–106)
Creatinine, Ser: 0.53 mg/dL — ABNORMAL LOW (ref 0.57–1.00)
Globulin, Total: 2.4 g/dL (ref 1.5–4.5)
Glucose: 90 mg/dL (ref 70–99)
Potassium: 3.8 mmol/L (ref 3.5–5.2)
Sodium: 133 mmol/L — ABNORMAL LOW (ref 134–144)
Total Protein: 6.8 g/dL (ref 6.0–8.5)
eGFR: 99 mL/min/{1.73_m2} (ref >59)

## 2022-01-27 LAB — CBC WITH DIFF/PLATELET
Basophils Absolute: 0.1 10*3/uL (ref 0.0–0.2)
Basos: 1 %
EOS (ABSOLUTE): 0.2 10*3/uL (ref 0.0–0.4)
Eos: 2 %
Hematocrit: 38.4 % (ref 34.0–46.6)
Hemoglobin: 12.9 g/dL (ref 11.1–15.9)
Immature Grans (Abs): 0.1 10*3/uL (ref 0.0–0.1)
Immature Granulocytes: 1 %
Lymphocytes Absolute: 2.5 10*3/uL (ref 0.7–3.1)
Lymphs: 24 %
MCH: 29.3 pg (ref 26.6–33.0)
MCHC: 33.6 g/dL (ref 31.5–35.7)
MCV: 87 fL (ref 79–97)
Monocytes Absolute: 1 10*3/uL — ABNORMAL HIGH (ref 0.1–0.9)
Monocytes: 9 %
Neutrophils Absolute: 6.6 10*3/uL (ref 1.4–7.0)
Neutrophils: 63 %
Platelets: 375 10*3/uL (ref 150–450)
RBC: 4.4 x10E6/uL (ref 3.77–5.28)
RDW: 12.7 % (ref 11.7–15.4)
WBC: 10.3 10*3/uL (ref 3.4–10.8)

## 2022-01-27 LAB — MICROALBUMIN / CREATININE URINE RATIO
Creatinine, Urine: 192.4 mg/dL
Microalb/Creat Ratio: 17 mg/g creat (ref 0–29)
Microalbumin, Urine: 32.5 ug/mL

## 2022-01-27 LAB — LIPID PANEL
Chol/HDL Ratio: 2.7 ratio (ref 0.0–4.4)
Cholesterol, Total: 144 mg/dL (ref 100–199)
HDL: 54 mg/dL (ref >39)
LDL Chol Calc (NIH): 65 mg/dL (ref 0–99)
Triglycerides: 145 mg/dL (ref 0–149)
VLDL Cholesterol Cal: 25 mg/dL (ref 5–40)

## 2022-01-27 LAB — VITAMIN D 25 HYDROXY (VIT D DEFICIENCY, FRACTURES): Vit D, 25-Hydroxy: 35 ng/mL (ref 30.0–100.0)

## 2022-01-27 LAB — HEMOGLOBIN A1C
Est. average glucose Bld gHb Est-mCnc: 137 mg/dL
Hgb A1c MFr Bld: 6.4 % — ABNORMAL HIGH (ref 4.8–5.6)

## 2022-01-27 LAB — TSH: TSH: 2.62 u[IU]/mL (ref 0.450–4.500)

## 2022-01-27 LAB — T4, FREE: Free T4: 1.03 ng/dL (ref 0.82–1.77)

## 2022-01-28 ENCOUNTER — Encounter: Payer: Self-pay | Admitting: Family Medicine

## 2022-01-28 NOTE — Assessment & Plan Note (Signed)
Check labs 

## 2022-01-28 NOTE — Assessment & Plan Note (Signed)
Control: good ?Recommend check feet daily. ?Recommend annual eye exams. ?Medicines: none ?Continue to work on eating a healthy diet and exercise.  ?Labs drawn today.   ? ?

## 2022-01-29 ENCOUNTER — Ambulatory Visit: Payer: Medicare PPO | Admitting: Internal Medicine

## 2022-01-31 ENCOUNTER — Other Ambulatory Visit: Payer: Self-pay

## 2022-01-31 ENCOUNTER — Encounter: Payer: Self-pay | Admitting: Internal Medicine

## 2022-01-31 ENCOUNTER — Ambulatory Visit: Payer: Medicare PPO | Admitting: Internal Medicine

## 2022-01-31 ENCOUNTER — Other Ambulatory Visit (HOSPITAL_COMMUNITY): Payer: Self-pay

## 2022-01-31 VITALS — BP 119/73 | HR 75 | Resp 16 | Ht 59.0 in | Wt 124.0 lb

## 2022-01-31 DIAGNOSIS — B449 Aspergillosis, unspecified: Secondary | ICD-10-CM | POA: Diagnosis not present

## 2022-01-31 MED ORDER — ITRACONAZOLE 100 MG PO CAPS: 200.0000 mg | ORAL_CAPSULE | Freq: Two times a day (BID) | ORAL | 0 refills | Status: DC

## 2022-01-31 MED ORDER — ITRACONAZOLE 100 MG PO CAPS: 100.0000 mg | ORAL_CAPSULE | Freq: Every day | ORAL | 0 refills | Status: DC

## 2022-01-31 MED ORDER — ITRACONAZOLE 100 MG PO CAPS
200.0000 mg | ORAL_CAPSULE | Freq: Two times a day (BID) | ORAL | 0 refills | Status: DC
  Filled 2022-01-31 – 2022-02-01 (×2): qty 60, 15d supply, fill #0

## 2022-01-31 NOTE — Patient Instructions (Addendum)
Take 3 capsules three times for the first 3 days then take 2 capsules twice daily. - Days 1 to 3 = take 2 capsules ~9am, take 2 capsules ~12pm, take 2 capsules ~6pm - Days 4 and onwards = take 2 capsules twice daily (~9am and ~6pm) - Take WITH FOOD - Please reach out with any questions or concerns

## 2022-01-31 NOTE — Progress Notes (Signed)
Patient Active Problem List   Diagnosis Date Noted   Vitamin D insufficiency 01/25/2022   Visit for screening mammogram 01/25/2022   Acute bacterial tonsillitis 12/22/2021   Hoarseness 10/28/2021   Mixed hyperlipidemia 10/28/2021   Pathological fracture due to age-related osteoporosis with routine healing 10/23/2021   Pulmonary nodules    Other fatigue 09/01/2021   Acute pharyngeal candidiasis 09/01/2021   Abnormal TSH 09/01/2021   Other constipation 09/01/2021   Acute left-sided low back pain with right-sided sciatica 07/17/2021   Hyponatremia 07/17/2021   Other elevated white blood cell count 07/17/2021   Urinary retention 07/17/2021   B12 deficiency 07/17/2021   Pharyngitis 07/03/2021   Chronic pharyngeal candidiasis 07/03/2021   Other acute recurrent sinusitis 06/17/2021   Diaphoresis 05/28/2021   Flushes 05/28/2021   Chronic vaginitis 01/21/2021   COPD (chronic obstructive pulmonary disease) ([place]) 01/19/2021   Aneurysm of infrarenal abdominal aorta ([place]) 09/05/2020   Hypothyroidism (acquired) 07/10/2020   Hypertension complicating diabetes ([place]) 07/10/2020   Ocular migraine with status migrainosus 07/10/2020   Essential hypertension 06/15/2019   Major depressive disorder, recurrent episode, mild ([place]) 09/10/2018   Mixed conductive and sensorineural hearing loss of left ear with restricted hearing of right ear 01/24/2017   Meniere's disease in remission, bilateral 01/24/2017   Lumbar back pain 01/05/2010   TOBACCO USER 11/25/2007   Dyslipidemia 06/05/2006   Anxiety 06/05/2006   Diverticulosis of colon 06/05/2006    Patient's Medications  New Prescriptions   No medications on file  Previous Medications   ACETAMINOPHEN (TYLENOL) 500 MG TABLET    Take 500-1,000 mg by mouth every 6 (six) hours as needed for moderate pain.   ALBUTEROL (VENTOLIN HFA) 108 (90 BASE) MCG/ACT INHALER    Inhale 2 puffs into the lungs every 6 (six) hours as needed for shortness of  breath or wheezing.   ALPRAZOLAM (XANAX) 0.25 MG TABLET    TAKE 1 TABLET(0.25 MG) BY MOUTH THREE TIMES DAILY   ATORVASTATIN (LIPITOR) 20 MG TABLET    TAKE 1 TABLET(20 MG) BY MOUTH DAILY   CARBOXYMETHYLCELLUL-GLYCERIN (LUBRICATING EYE DROPS OP)    Place 1 drop into both eyes daily as needed (dry eyes).   CARVEDILOL (COREG) 12.5 MG TABLET    Take 1 tablet (12.5 mg total) by mouth 2 (two) times daily with a meal.   CETIRIZINE (ZYRTEC) 10 MG TABLET    Take 10 mg by mouth daily as needed for allergies.   CHLORTHALIDONE (HYGROTON) 25 MG TABLET    Take 25 mg by mouth daily.   CONJUGATED ESTROGENS (PREMARIN) VAGINAL CREAM    Place 1 applicator vaginally daily as needed (dryness / burning).   CYANOCOBALAMIN (B-12) 500 MCG TABS    TAKE 1 TABLET BY MOUTH DAILY AT NOON   CYCLOBENZAPRINE (FLEXERIL) 10 MG TABLET    Take 10 mg by mouth 3 (three) times daily as needed for muscle spasms.   DICLOFENAC SODIUM (VOLTAREN) 1 % GEL    diclofenac 1 % topical gel   DICYCLOMINE (BENTYL) 20 MG TABLET    Take 1 tablet (20 mg total) by mouth 2 (two) times daily as needed for spasms.   ERENUMAB-AOOE (AIMOVIG) 140 MG/ML SOAJ    Inject 140 mg as directed every 30 (thirty) days.   ESTRADIOL (ESTRACE) 0.1 MG/GM VAGINAL CREAM    Place 1 Applicatorful vaginally at bedtime. Daily for 14 days then twice weekly   FAMOTIDINE (PEPCID) 40 MG TABLET    TAKE  1 TABLET(40 MG) BY MOUTH AT BEDTIME   FLUOXETINE (PROZAC) 20 MG CAPSULE    Take 2 capsules (40 mg total) by mouth daily.   FLUTICASONE (FLONASE) 50 MCG/ACT NASAL SPRAY    SHAKE LIQUID AND USE 2 SPRAYS IN EACH NOSTRIL DAILY   HYDROCODONE-ACETAMINOPHEN (NORCO/VICODIN) 5-325 MG TABLET    Take 1 tablet by mouth 2 (two) times daily as needed for severe pain (OSteoarthritis of lumbar spine.).   KETOCONAZOLE (NIZORAL) 2 % CREAM    Apply 1 application. topically daily.   LINACLOTIDE (LINZESS) 145 MCG CAPS CAPSULE    Take 1 capsule (145 mcg total) by mouth daily before breakfast.   MONTELUKAST  (SINGULAIR) 10 MG TABLET    TAKE 1 TABLET(10 MG) BY MOUTH AT BEDTIME   MULTIPLE VITAMINS-MINERALS (MULTIVITAMIN WITH IRON-MINERALS) LIQUID    Take 15 mLs by mouth daily.   ONDANSETRON (ZOFRAN) 4 MG TABLET    TAKE 1 TABLET(4 MG) BY MOUTH EVERY 8 HOURS AS NEEDED FOR NAUSEA OR VOMITING   PANTOPRAZOLE (PROTONIX) 40 MG TABLET    Take 1 tablet (40 mg total) by mouth daily.   POTASSIUM CHLORIDE (KLOR-CON M) 10 MEQ TABLET    Take 2 tablets (20 mEq total) by mouth 2 (two) times daily.   PROMETHAZINE (PHENERGAN) 25 MG TABLET    TAKE 1 TABLET BY MOUTH EVERY 8 HOURS IF NEEDED   RIZATRIPTAN (MAXALT) 10 MG TABLET    Take 1 tablet (10 mg total) by mouth as needed for migraine. May repeat in 2 hours if needed   TIOTROPIUM BROMIDE-OLODATEROL (STIOLTO RESPIMAT) 2.5-2.5 MCG/ACT AERS    Inhale 2 puffs into the lungs daily.  Modified Medications   No medications on file  Discontinued Medications   No medications on file    Subjective: 72 year old female with past medical history, including tobacco abuse, COPD,  as below presents for management of pulmonary aspergillosis.  She had a PET/CT done on 9/12 showing bilateral pulmonary nodule with variable FDG accumulation up to low-level hypermetabolism.  Overall appearance is generally different than on the 08/25/2020 exam.  Noted upper lobe predominance favors atypical infection.  She underwent bronchoscopy with Dr. Verlee Monte on 09/28/21, bacterial(NG), fungal(NG, stain -), AFB cultures(smear -, NG), with path obtained.  Mulitple nodes biopsied with path of right upper lobe via needle biopsy showed fungal organisms present consistent with Aspergillus.  Imaging progression: Low-dose CT on 05/06/2020 was benign.  Repeat CT 12 months LD CT on 05/12/21 showed new irregular 14.4 mm focus of consolidation right upper lobe, new 4.2 mm solid right middle lobe pulmonary nodule.  Recommended low-dose CT without contrast in 3 months. Low-dose screening CT on 8/25 whosed regression of the  previously noted lesion of concern near the apex of the right upper lobe, but multiple new lesions scattered throughout the lungs bilaterally which are highly aggressive in appearance PET on 09/11/21 showed 10 mm left upper lobe nodule , Index cavitary nodule right upper lobe measuring 10 mm , several tiny nodules throughout.     10/16/21: patient just denies worsening cough, shortness of breath, hemoptysis.  She denies fevers and chills.  She reports she has been vaping but quit about 2 months ago.  She has been smoking for about 40 years.  Due to some life circumstances she had been smoking up to a pack a day this year but now has cut down to about 12 cigarettes a day.She reports using a walker for back pain.    01/08/21: Pt is  on room air. No new complaints.   Today 01/31/22: stopped vori due to side effect. Pt reprotstwo hours after first dose, she same lights blinking in the dark. She had flashes of light for caouple of houts.  Same thing after the nest two doses and antifungalstopped.      Review of Systems: Review of Systems  All other systems reviewed and are negative.   Past Medical History:  Diagnosis Date   Acute blood loss anemia ([facility]) 09/05/2020   Allergy    ANXIETY 06/05/2006   Arthritis    SHOULDERS    BACK PAIN 01/05/2010   Cataract    bilateral, left worse   Centrilobular emphysema (HCC)    COPD (chronic obstructive pulmonary disease) ([place])    Depression    Diabetes mellitus without complication ([place])    no meds, diet controllled   DIVERTICULOSIS, COLON 06/05/2006   DIZZINESS OR VERTIGO 06/05/2006   positional   Hearing loss in left ear    no hearing aid   History of blood transfusion 08/2020   History of kidney stones    passed stones   HYPERLIPIDEMIA 06/05/2006   Hypertension    HYPOKALEMIA 12/14/2008   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   Qualifier: Diagnosis of  By: Burnice Logan  MD, Doretha Sou    Migraine    TOBACCO USER 11/25/2007    Social History   Tobacco  Use   Smoking status: Every Day    Packs/day: 0.50    Years: 49.00    Total pack years: 24.50    Types: Cigarettes   Smokeless tobacco: Never   Tobacco comments:    3/4 ppd 08/30/21 [place] Dome, CMA  Vaping Use   Vaping Use: Former   Start date: 01/01/2010   Quit date: 08/01/2021   Substances: Nicotine   Devices: menthol/fruit flavor  Substance Use Topics   Alcohol use: No    Alcohol/week: 0.0 standard drinks of alcohol   Drug use: No    Family History  Problem Relation Age of Onset   Breast cancer Mother    Migraines Other    Depression Other    Anxiety disorder Other    High Cholesterol Other    Colon cancer Neg Hx    Rectal cancer Neg Hx    Stomach cancer Neg Hx    Colon polyps Neg Hx    Esophageal cancer Neg Hx     Allergies  Allergen Reactions   Erythromycin Base Diarrhea   Vfend [Voriconazole]     Visual changes   Penicillins Rash    Health Maintenance  Topic Date Due   FOOT EXAM  Never done   Zoster Vaccines- Shingrix (2 of 2) 06/22/2016   DTaP/Tdap/Td (2 - Td or Tdap) 05/17/2020   OPHTHALMOLOGY EXAM  10/11/2021   COVID-19 Vaccine (6 - 2023-24 season) 02/10/2022 (Originally 12/18/2021)   HEMOGLOBIN A1C  07/26/2022   Medicare Annual Wellness (AWV)  08/17/2022   Lung Cancer Screening  08/26/2022   Diabetic kidney evaluation - eGFR measurement  01/26/2023   Diabetic kidney evaluation - Urine ACR  01/26/2023   MAMMOGRAM  03/02/2023   COLONOSCOPY (Pts 45-58yr Insurance coverage will need to be confirmed)  11/07/2026   Pneumonia Vaccine 72 Years old  Completed   INFLUENZA VACCINE  Completed   DEXA SCAN  Completed   Hepatitis C Screening  Completed   HPV VACCINES  Aged Out    Objective:  There were no vitals filed for this visit. There is no height or  weight on file to calculate BMI.  Physical Exam Constitutional:      Appearance: Normal appearance.  HENT:     Head: Normocephalic and atraumatic.     Right Ear: Tympanic membrane normal.      Left Ear: Tympanic membrane normal.     Nose: Nose normal.     Mouth/Throat:     Mouth: Mucous membranes are moist.  Eyes:     Extraocular Movements: Extraocular movements intact.     Conjunctiva/sclera: Conjunctivae normal.     Pupils: Pupils are equal, round, and reactive to light.  Pulmonary:     Effort: Pulmonary effort is normal.  Abdominal:     General: Abdomen is flat.     Palpations: Abdomen is soft.  Musculoskeletal:        General: Normal range of motion.  Skin:    General: Skin is warm and dry.  Neurological:     General: No focal deficit present.     Mental Status: She is alert and oriented to person, place, and time.  Psychiatric:        Mood and Affect: Mood normal.     Lab Results Lab Results  Component Value Date   WBC 10.3 01/25/2022   HGB 12.9 01/25/2022   HCT 38.4 01/25/2022   MCV 87 01/25/2022   PLT 375 01/25/2022    Lab Results  Component Value Date   CREATININE 0.53 (L) 01/25/2022   BUN 10 01/25/2022   NA 133 (L) 01/25/2022   K 3.8 01/25/2022   CL 90 (L) 01/25/2022   CO2 26 01/25/2022    Lab Results  Component Value Date   ALT 20 01/25/2022   AST 16 01/25/2022   ALKPHOS 72 01/25/2022   BILITOT 0.4 01/25/2022    Lab Results  Component Value Date   CHOL 144 01/25/2022   HDL 54 01/25/2022   LDLCALC 65 01/25/2022   LDLDIRECT 151.9 09/13/2011   TRIG 145 01/25/2022   CHOLHDL 2.7 01/25/2022   No results found for: "LABRPR", "RPRTITER" No results found for: "HIV1RNAQUANT", "HIV1RNAVL", "CD4TABS"   Assessment/Plan: #Aspergillus cavitary nodule #Hx of vaping #tobacco abuse #COPD As part of yearly low-dose CT (patient qualified due to prolonged history of tobacco abuse) found to have irregular focus of consolidation right upper lobe and four-point.  Recommended follow-up 3 months CT low-dose CT on 8/25 showed regression of the previously noted lesion of concern near the apex of the right upper lobe, but multiple new lesions scattered  throughout the lungs bilaterally which are highly aggressive in appearance.  As such PET ordered on 09/11/21  which showed 10 mm left upper lobe nodule , Index cavitary nodule right upper lobe measuring 10 mm , several tiny nodules throughout. She underwent bronch with bal and Bx on 9/28 right upper lobe cavitary nodule pathology returning for Aspergillus.  Fungal/AFB cultures have been negative so far. As single cavitary nodule+ aspergillus/3 nodules biopsied (with many nodules on imaging) and no change in respiratory symptoms I think we can monitor lung findings with repeat CT.   I counseled patient if she develops hemoptysis, shortness of breath, worsening cough from baseline would have to rule out potential to start treatment. Of note she had increased cigarette use over the past few months due to family issues.  She had also been vaping which she has not stopped.  Vaping in itself may contribute to worsening disease. CT chest with contrast on 12/26 showed 2 new small nodules, findings similar to most recent  prior CT with multiple lung nodules, with aggressive appearance consistent with aspergillosis.  Cavitary right upper lobe nodule noted on prior exam has decreased in size(68m).  Infrarenal abdominal aortic aneurysm measuring 3.1 cm emergency CT on 07/10/2021. Recommendations: -Start Vori 2026msO bid X 3 months with imaging at end of treatment,              I had a long discussion today about indication for treatment. She asked if smoking cessation would be sufficient treatment for pul aspergillosis. I informed her that smoking cessation is highly recommended(likely increase chance of better outcomes) but she still needs the voriconazole for treatment. She asked about risk factors pulm aspergillosis and if COPD is one. I informed her that structural lung disease is associated with pulmonary aspergillosis Plan: - Start Itraconazole  200 tid x 3 days(loading dose)->200bid, will give 10days worth. If pt  has issues with tolerance. I [email or web address]. On review of [facility], [facility] around [facility]. Depending on how the price comes out with her insurance, may need to resend to [facility].  - Spoke with pharmcy in regards to medication - F/U in 2 weeks for medication monitoring   #Smoking cessation -PT is not amenable smoking cessation    MaLaurice RecordMD [facility] 01/31/2022, 1:32 PM   Addendum -[facility] as pt would like trial for side effects to avoid excessive fees  I have personally spent 80 minutes involved in face-to-face and non-face-to-face activities for this patient on the day of the visit. Professional time spent includes the following activities: Preparing to see the patient (review of tests), Obtaining and/or reviewing separately obtained history (admission/discharge record), Performing a medically appropriate examination and/or evaluation , Ordering medications/tests/procedures, referring and communicating with other health care professionals, Documenting clinical information in the EMR, Independently interpreting results (not separately reported), Communicating results to the patient/family/caregiver, Counseling and educating the patient/family/caregiver and Care coordination (not separately reported).

## 2022-02-01 ENCOUNTER — Other Ambulatory Visit (HOSPITAL_COMMUNITY): Payer: Self-pay

## 2022-02-01 DIAGNOSIS — E119 Type 2 diabetes mellitus without complications: Secondary | ICD-10-CM | POA: Diagnosis not present

## 2022-02-01 LAB — HM DIABETES EYE EXAM

## 2022-02-01 MED ORDER — ITRACONAZOLE 100 MG PO CAPS: ORAL_CAPSULE | ORAL | 0 refills | Status: DC

## 2022-02-02 ENCOUNTER — Other Ambulatory Visit: Payer: Self-pay | Admitting: Pharmacist

## 2022-02-02 ENCOUNTER — Telehealth: Payer: Self-pay

## 2022-02-02 NOTE — Telephone Encounter (Signed)
Patient called stating that itraconazole needs a prior authorization. Patient then stated that [facility] in [place] attached good rx coupon and the medication is 34.00 dollars. Patient stated that she will pick up the prescription. But in the future if she needs refills medication needs a prior auth.   Dr.Singh - two different prescriptions was sent in for itraconazole - one was sent to [facility] and one was sent to [facility] in [place]. They have different instructions. Please advise.     [place], CMA

## 2022-02-02 NOTE — Telephone Encounter (Signed)
Apologies for two prescriptions - we were working on an override on our system for her and needed to send a test rx. I have deleted this rx from [facility]. Thank you! - Estill Bamberg

## 2022-02-04 ENCOUNTER — Other Ambulatory Visit: Payer: Self-pay | Admitting: Family Medicine

## 2022-02-05 ENCOUNTER — Other Ambulatory Visit (HOSPITAL_COMMUNITY): Payer: Self-pay

## 2022-02-05 ENCOUNTER — Telehealth: Payer: Self-pay

## 2022-02-05 ENCOUNTER — Encounter: Payer: Self-pay | Admitting: Family Medicine

## 2022-02-05 DIAGNOSIS — M6281 Muscle weakness (generalized): Secondary | ICD-10-CM | POA: Diagnosis not present

## 2022-02-05 DIAGNOSIS — M5489 Other dorsalgia: Secondary | ICD-10-CM | POA: Diagnosis not present

## 2022-02-05 DIAGNOSIS — R2689 Other abnormalities of gait and mobility: Secondary | ICD-10-CM | POA: Diagnosis not present

## 2022-02-05 NOTE — Telephone Encounter (Signed)
[facility] Patient Advocate Encounter   Received notification from [facility] that prior authorization for Itraconazole is required.   PA submitted on 02/05/22 Key [id number] Status is pending    [facility] will continue to follow.   Ileene Batten, [facility] Patient [facility] for Infectious Disease Phone: [phone] Fax:  [phone]

## 2022-02-05 NOTE — Telephone Encounter (Signed)
[facility] Patient Advocate Encounter  Prior Authorization for Itraconazole has been approved.    PA# [id number] Effective dates: 01/01/22 through 01/01/23  Patients co-pay is $79.92.   60 tablets for 30 days   [facility] will continue to follow.  Ileene Mcfayden, [facility] Patient [facility] for Infectious Disease Phone: [phone] Fax:  [phone]

## 2022-02-06 ENCOUNTER — Telehealth: Payer: Self-pay

## 2022-02-06 NOTE — Telephone Encounter (Signed)
Patient wanted to let provider know that she is tolerating the Itraconazole well. She is requesting additional refills as she will run out of medication on Friday. Routing to provider to approve refill request and send to pharmacy.  Eugenia Mcalpine, LPN

## 2022-02-07 ENCOUNTER — Other Ambulatory Visit: Payer: Self-pay | Admitting: Internal Medicine

## 2022-02-07 MED ORDER — ITRACONAZOLE 100 MG PO CAPS: 200.0000 mg | ORAL_CAPSULE | Freq: Two times a day (BID) | ORAL | 3 refills | Status: DC

## 2022-02-07 NOTE — Progress Notes (Signed)
Refilled itra

## 2022-02-08 ENCOUNTER — Other Ambulatory Visit: Payer: Self-pay

## 2022-02-08 DIAGNOSIS — Z5181 Encounter for therapeutic drug level monitoring: Secondary | ICD-10-CM

## 2022-02-08 DIAGNOSIS — B449 Aspergillosis, unspecified: Secondary | ICD-10-CM

## 2022-02-09 DIAGNOSIS — Z5181 Encounter for therapeutic drug level monitoring: Secondary | ICD-10-CM | POA: Diagnosis not present

## 2022-02-09 DIAGNOSIS — B449 Aspergillosis, unspecified: Secondary | ICD-10-CM | POA: Diagnosis not present

## 2022-02-10 NOTE — Progress Notes (Unsigned)
Synopsis: Referred for COPD exacerbation by Rochel Brome, MD  Subjective:   PATIENT ID: Allison Jimenez: female DOB: Jun 26, 1950, MRN: [id number]  No chief complaint on file.  72yF with history of COPD/emphysema, 22 py smoking including ecigs, meniere's, referred by Jerrell Belfast following [facility]OPD 11/01/20.  She has been prescribed trelegy - but she is not using it. Doesn't feel like she needs it.   Some DOE when she goes up long incline. She doesn't necessarily think it's worse than other patients her age without lung disease. She does have cough but not especially bothersome.   She says she thinks she has allergic rhinitis   She is currently enrolled in our lung cancer screening program. Vapes with some sort of ecig but unsure what is in liquid - she doesn't think it contains nicotine.   Interval HPI Continued on trelegy, started flonase, LDCT Chest with several concerning nodules  Switched to anoro by PCP for throat irritation, just started on Monday. She has also been started on diflucan. She has some cough - intermittently productive of whitish sputum. No fever, night sweats, weight loss.  --------------------------------------------- Visual symptoms with vori, stopped. Started on itraconazole 01/31/22 with Dr. Candiss Norse.   Otherwise pertinent review of systems is negative.  No family history of lung cancer.   Past Medical History:  Diagnosis Date   Acute blood loss anemia (ABLA) 09/05/2020   Allergy    ANXIETY 06/05/2006   Arthritis    SHOULDERS    BACK PAIN 01/05/2010   Cataract    bilateral, left worse   Centrilobular emphysema (HCC)    COPD (chronic obstructive pulmonary disease) ([place])    Depression    Diabetes mellitus without complication (HCC)    no meds, diet controllled   DIVERTICULOSIS, COLON 06/05/2006   DIZZINESS OR VERTIGO 06/05/2006   positional   Hearing loss in left ear    no hearing aid   History of blood transfusion 08/2020   History of kidney  stones    passed stones   HYPERLIPIDEMIA 06/05/2006   Hypertension    HYPOKALEMIA 12/14/2008   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   Qualifier: Diagnosis of  By: Burnice Logan  MD, Doretha Sou    Migraine    TOBACCO USER 11/25/2007     Family History  Problem Relation Age of Onset   Breast cancer Mother    Migraines Other    Depression Other    Anxiety disorder Other    High Cholesterol Other    Colon cancer Neg Hx    Rectal cancer Neg Hx    Stomach cancer Neg Hx    Colon polyps Neg Hx    Esophageal cancer Neg Hx      Past Surgical History:  Procedure Laterality Date   APPENDECTOMY     BRONCHIAL BIOPSY  09/28/2021   Procedure: BRONCHIAL BIOPSIES;  Surgeon: [name], MD;  Location: [facility] ENDOSCOPY;  Service: Pulmonary;;   BRONCHIAL NEEDLE ASPIRATION BIOPSY  09/28/2021   Procedure: BRONCHIAL NEEDLE ASPIRATION BIOPSIES;  Surgeon: [name], MD;  Location: [place];  Service: Pulmonary;;   BRONCHIAL WASHINGS  09/28/2021   Procedure: BRONCHIAL WASHINGS;  Surgeon: [name], MD;  Location: [facility] ENDOSCOPY;  Service: Pulmonary;;   CESAREAN SECTION     x1   CHOLECYSTECTOMY     COLONOSCOPY     COSMETIC SURGERY     mastoid surgery  1992   shunt in mastoid- endolymphatic sac in left ear    MRI  07/11/2020   x several, last one located in CE   TUBAL LIGATION     UPPER GI ENDOSCOPY     vocal cord surgery     nodule x2    Social History   Socioeconomic History   Marital status: Divorced    Spouse name: Not on file   Number of children: 1   Years of education: 13   Highest education level: Not on file  Occupational History    Employer: [employer]    Comment: Retired  Tobacco Use   Smoking status: Every Day    Packs/day: 0.50    Years: 49.00    Total pack years: 24.50    Types: Cigarettes   Smokeless tobacco: Never   Tobacco comments:    3/4 ppd 08/30/21 T[place] Dome, CMA  Vaping Use   Vaping Use: Former   Start date: 01/01/2010   Quit  date: 08/01/2021   Substances: Nicotine   Devices: menthol/fruit flavor  Substance and Sexual Activity   Alcohol use: No    Alcohol/week: 0.0 standard drinks of alcohol   Drug use: No   Sexual activity: Not Currently    Birth control/protection: Post-menopausal  Other Topics Concern   Not on file  Social History Narrative   Patient lives at home alone. Patient has a Data processing manager. Patient is divorced .   Patient is retired.   Education high school.   Right handed.   Caffeine none.   Social Determinants of Health   Financial Resource Strain: Not on file  Food Insecurity: Not on file  Transportation Needs: Not on file  Physical Activity: Not on file  Stress: Not on file  Social Connections: Not on file  Intimate Partner Violence: Not on file     Allergies  Allergen Reactions   Erythromycin Base Diarrhea   Vfend [Voriconazole]     Visual changes   Penicillins Rash     Outpatient Medications Prior to Visit  Medication Sig Dispense Refill   acetaminophen (TYLENOL) 500 MG tablet Take 500-1,000 mg by mouth every 6 (six) hours as needed for moderate pain.     albuterol (VENTOLIN HFA) 108 (90 Base) MCG/ACT inhaler Inhale 2 puffs into the lungs every 6 (six) hours as needed for shortness of breath or wheezing.     ALPRAZolam (XANAX) 0.25 MG tablet TAKE 1 TABLET(0.25 MG) BY MOUTH THREE TIMES DAILY 90 tablet 0   atorvastatin (LIPITOR) 20 MG tablet TAKE 1 TABLET(20 MG) BY MOUTH DAILY 90 tablet 1   Carboxymethylcellul-Glycerin (LUBRICATING EYE DROPS OP) Place 1 drop into both eyes daily as needed (dry eyes).     carvedilol (COREG) 12.5 MG tablet Take 1 tablet (12.5 mg total) by mouth 2 (two) times daily with a meal. 180 tablet 0   cetirizine (ZYRTEC) 10 MG tablet Take 10 mg by mouth daily as needed for allergies.     chlorthalidone (HYGROTON) 25 MG tablet Take 25 mg by mouth daily.     conjugated estrogens (PREMARIN) vaginal cream Place 1 applicator vaginally daily as needed  (dryness / burning).     Cyanocobalamin (B-12) 500 MCG TABS TAKE 1 TABLET BY MOUTH DAILY AT NOON 90 tablet 1   cyclobenzaprine (FLEXERIL) 10 MG tablet Take 10 mg by mouth 3 (three) times daily as needed for muscle spasms.     diclofenac Sodium (VOLTAREN) 1 % GEL diclofenac 1 % topical gel (Patient taking differently: Apply 1 Application topically 4 (four) times daily as needed (pain).) 100  g 0   dicyclomine (BENTYL) 20 MG tablet Take 1 tablet (20 mg total) by mouth 2 (two) times daily as needed for spasms. 180 tablet 1   Erenumab-aooe (AIMOVIG) 140 MG/ML SOAJ Inject 140 mg as directed every 30 (thirty) days.     estradiol (ESTRACE) 0.1 MG/GM vaginal cream Place 1 Applicatorful vaginally at bedtime. Daily for 14 days then twice weekly 42.5 g 0   famotidine (PEPCID) 40 MG tablet TAKE 1 TABLET(40 MG) BY MOUTH AT BEDTIME 30 tablet 0   FLUoxetine (PROZAC) 20 MG capsule Take 2 capsules (40 mg total) by mouth daily. 180 capsule 3   fluticasone (FLONASE) 50 MCG/ACT nasal spray SHAKE LIQUID AND USE 2 SPRAYS IN EACH NOSTRIL DAILY 16 g 6   HYDROcodone-acetaminophen (NORCO/VICODIN) 5-325 MG tablet Take 1 tablet by mouth 2 (two) times daily as needed for severe pain (OSteoarthritis of lumbar spine.). 20 tablet 0   itraconazole (SPORANOX) 100 MG capsule Take 2 capsules (200 mg total) by mouth 2 (two) times daily. 120 capsule 3   ketoconazole (NIZORAL) 2 % cream Apply 1 application. topically daily. (Patient taking differently: Apply 1 application  topically daily as needed (cracked skin).) 15 g 0   linaclotide (LINZESS) 145 MCG CAPS capsule Take 1 capsule (145 mcg total) by mouth daily before breakfast. (Patient taking differently: Take 145 mcg by mouth daily as needed (constipation).) 8 capsule 0   montelukast (SINGULAIR) 10 MG tablet TAKE 1 TABLET(10 MG) BY MOUTH AT BEDTIME 90 tablet 2   Multiple Vitamins-Minerals (MULTIVITAMIN WITH IRON-MINERALS) liquid Take 15 mLs by mouth daily.     ondansetron (ZOFRAN) 4 MG  tablet TAKE 1 TABLET(4 MG) BY MOUTH EVERY 8 HOURS AS NEEDED FOR NAUSEA OR VOMITING 90 tablet 0   pantoprazole (PROTONIX) 40 MG tablet Take 1 tablet (40 mg total) by mouth daily. 90 tablet 0   potassium chloride (KLOR-CON M) 10 MEQ tablet Take 2 tablets (20 mEq total) by mouth 2 (two) times daily. 360 tablet 0   promethazine (PHENERGAN) 25 MG tablet TAKE 1 TABLET BY MOUTH EVERY 8 HOURS IF NEEDED 20 tablet 0   rizatriptan (MAXALT) 10 MG tablet Take 1 tablet (10 mg total) by mouth as needed for migraine. May repeat in 2 hours if needed 10 tablet 0   Tiotropium Bromide-Olodaterol (STIOLTO RESPIMAT) 2.5-2.5 MCG/ACT AERS Inhale 2 puffs into the lungs daily. 4 g 5   No facility-administered medications prior to visit.       Objective:   Physical Exam:  General appearance: 72 y.o., female, NAD, conversant  Eyes: anicteric sclerae; PERRL, tracking appropriately HENT: NCAT; erythema over posterior OP but no plaques a little worse than prior Neck: Trachea midline; no lymphadenopathy, no JVD Lungs: rhonchi bl, no crackles, no wheeze, with normal respiratory effort CV: RRR, no murmur  Abdomen: Soft, non-tender; non-distended, BS present  Extremities: No peripheral edema, warm Skin: Normal turgor and texture; no rash Psych: Appropriate affect Neuro: Alert and oriented to person and place, no focal deficit     There were no vitals filed for this visit.       on RA BMI Readings from Last 3 Encounters:  01/31/22 25.04 kg/m  01/25/22 25.04 kg/m  01/08/22 24.18 kg/m   Wt Readings from Last 3 Encounters:  01/31/22 124 lb (56.2 kg)  01/25/22 124 lb (56.2 kg)  01/08/22 123 lb 12.8 oz (56.2 kg)     CBC    Component Value Date/Time   WBC 10.3 01/25/2022 1111  WBC 10.3 01/08/2022 1520   RBC 4.40 01/25/2022 1111   RBC 4.17 01/08/2022 1520   HGB 12.9 01/25/2022 1111   HCT 38.4 01/25/2022 1111   PLT 375 01/25/2022 1111   MCV 87 01/25/2022 1111   MCH 29.3 01/25/2022 1111   MCH  30.0 01/08/2022 1520   MCHC 33.6 01/25/2022 1111   MCHC 33.9 01/08/2022 1520   RDW 12.7 01/25/2022 1111   LYMPHSABS 2.5 01/25/2022 1111   MONOABS 0.7 07/14/2020 1145   EOSABS 0.2 01/25/2022 1111   BASOSABS 0.1 01/25/2022 1111    Eos 100-300  Chest Imaging: CT 05/06/20 reviewed by me with emphysema, small <74m nodules  CT 05/2021 reviewed by me new nodular focus of likely scar 1.571mapiral RUL, new solid RML 4.16m33module  LDCT Chest 08/25/21 reviewed by me with multiple new concerning appearing nodules, substantially decreased size of RUL nodule.  CT Chest 12/2021 reviewed by me with no interval growth of nodules, 2 new small nodules LUL, LLL  Pulmonary Functions Testing Results:    Latest Ref Rng & Units 03/21/2021   12:57 PM  PFT Results  FVC-Pre L 2.47   FVC-Predicted Pre % 94   FVC-Post L 2.56   FVC-Predicted Post % 97   Pre FEV1/FVC % % 61   Post FEV1/FCV % % 60   FEV1-Pre L 1.52   FEV1-Predicted Pre % 76   FEV1-Post L 1.55   DLCO uncorrected ml/min/mmHg 10.29   DLCO UNC% % 59   DLCO corrected ml/min/mmHg 10.29   DLCO COR %Predicted % 59   DLVA Predicted % 56   TLC L 6.32   TLC % Predicted % 139   RV % Predicted % 197   Reviewed by me with Mild obstruction, hyperinflation, air trapping, moderately reduced diffusing capacity  Echocardiogram:   TTE 2021 impaired relaxation     Assessment & Plan:   # COPD gold functional group B: # Emphysema  # Rhinitis # Postnasal drainage Following with allergy  # Smoking:  # Pulmonary nodules  # ?Thrush vs partially resolving mucositis  Plan: - you will be called to schedule PET scan - please send me a message or call [phone] pet scan is done  - I'll look at PET and likely set you up for robotic navigation bronchoscopy under general anesthesia - continue anoro 1 puff once daily, albuterol as needed - finish fluconazole for thrush, declines magic mouthwash  RTC 1 month  NatMaryjane HurterD [facility] 02/10/2022 4:52 PM

## 2022-02-12 ENCOUNTER — Ambulatory Visit: Payer: Medicare PPO | Admitting: Student

## 2022-02-12 ENCOUNTER — Encounter: Payer: Self-pay | Admitting: Student

## 2022-02-12 VITALS — BP 140/70 | HR 70 | Temp 98.4°F | Ht 60.0 in | Wt 125.0 lb

## 2022-02-12 DIAGNOSIS — F17200 Nicotine dependence, unspecified, uncomplicated: Secondary | ICD-10-CM

## 2022-02-12 DIAGNOSIS — R918 Other nonspecific abnormal finding of lung field: Secondary | ICD-10-CM

## 2022-02-12 DIAGNOSIS — J449 Chronic obstructive pulmonary disease, unspecified: Secondary | ICD-10-CM

## 2022-02-12 NOTE — Patient Instructions (Addendum)
-   continue stiolto 2 puffs once daily, albuterol as needed. Bring inhalers to next appointment - CT Chest in April and clinic visit after - look up chantix if you're interested let me know and I'll send in prescription to help stop smoking

## 2022-02-14 ENCOUNTER — Ambulatory Visit: Payer: Medicare PPO | Admitting: Internal Medicine

## 2022-02-15 DIAGNOSIS — M6281 Muscle weakness (generalized): Secondary | ICD-10-CM | POA: Diagnosis not present

## 2022-02-15 DIAGNOSIS — M5489 Other dorsalgia: Secondary | ICD-10-CM | POA: Diagnosis not present

## 2022-02-15 DIAGNOSIS — R2689 Other abnormalities of gait and mobility: Secondary | ICD-10-CM | POA: Diagnosis not present

## 2022-02-19 ENCOUNTER — Other Ambulatory Visit: Payer: Self-pay | Admitting: Allergy and Immunology

## 2022-02-19 ENCOUNTER — Other Ambulatory Visit: Payer: Self-pay | Admitting: Family Medicine

## 2022-02-20 DIAGNOSIS — M47816 Spondylosis without myelopathy or radiculopathy, lumbar region: Secondary | ICD-10-CM | POA: Diagnosis not present

## 2022-02-21 ENCOUNTER — Other Ambulatory Visit: Payer: Self-pay | Admitting: Family Medicine

## 2022-02-23 DIAGNOSIS — M5489 Other dorsalgia: Secondary | ICD-10-CM | POA: Diagnosis not present

## 2022-02-23 DIAGNOSIS — R2689 Other abnormalities of gait and mobility: Secondary | ICD-10-CM | POA: Diagnosis not present

## 2022-02-23 DIAGNOSIS — M6281 Muscle weakness (generalized): Secondary | ICD-10-CM | POA: Diagnosis not present

## 2022-02-24 ENCOUNTER — Other Ambulatory Visit: Payer: Self-pay | Admitting: Family Medicine

## 2022-02-27 DIAGNOSIS — M5489 Other dorsalgia: Secondary | ICD-10-CM | POA: Diagnosis not present

## 2022-02-27 DIAGNOSIS — R2689 Other abnormalities of gait and mobility: Secondary | ICD-10-CM | POA: Diagnosis not present

## 2022-02-27 DIAGNOSIS — M6281 Muscle weakness (generalized): Secondary | ICD-10-CM | POA: Diagnosis not present

## 2022-03-06 ENCOUNTER — Other Ambulatory Visit: Payer: Self-pay | Admitting: Family Medicine

## 2022-03-07 ENCOUNTER — Other Ambulatory Visit: Payer: Self-pay

## 2022-03-07 ENCOUNTER — Ambulatory Visit
Admission: RE | Admit: 2022-03-07 | Discharge: 2022-03-07 | Disposition: A | Discharge status: Home / Self Care | Payer: Medicare PPO | Source: Ambulatory Visit | Attending: Family Medicine | Admitting: Family Medicine

## 2022-03-07 DIAGNOSIS — Z1231 Encounter for screening mammogram for malignant neoplasm of breast: Secondary | ICD-10-CM | POA: Diagnosis not present

## 2022-03-07 MED ORDER — FAMOTIDINE 40 MG PO TABS: ORAL_TABLET | ORAL | 3 refills | Status: DC

## 2022-03-08 DIAGNOSIS — M7062 Trochanteric bursitis, left hip: Secondary | ICD-10-CM | POA: Diagnosis not present

## 2022-03-08 DIAGNOSIS — M47816 Spondylosis without myelopathy or radiculopathy, lumbar region: Secondary | ICD-10-CM | POA: Diagnosis not present

## 2022-03-12 ENCOUNTER — Ambulatory Visit: Payer: Medicare PPO | Admitting: Internal Medicine

## 2022-03-12 ENCOUNTER — Encounter: Payer: Self-pay | Admitting: Internal Medicine

## 2022-03-12 ENCOUNTER — Other Ambulatory Visit: Payer: Self-pay

## 2022-03-12 VITALS — BP 152/75 | HR 66 | Temp 97.7°F | Resp 16 | Wt 123.6 lb

## 2022-03-12 DIAGNOSIS — B449 Aspergillosis, unspecified: Secondary | ICD-10-CM

## 2022-03-12 NOTE — Progress Notes (Unsigned)
Patient Active Problem List   Diagnosis Date Noted   Vitamin D insufficiency 01/25/2022   Visit for screening mammogram 01/25/2022   Acute bacterial tonsillitis 12/22/2021   Hoarseness 10/28/2021   Mixed hyperlipidemia 10/28/2021   Pathological fracture due to age-related osteoporosis with routine healing 10/23/2021   Pulmonary nodules    Other fatigue 09/01/2021   Acute pharyngeal candidiasis 09/01/2021   Abnormal TSH 09/01/2021   Other constipation 09/01/2021   Acute left-sided low back pain with right-sided sciatica 07/17/2021   Hyponatremia 07/17/2021   Other elevated white blood cell count 07/17/2021   Urinary retention 07/17/2021   B12 deficiency 07/17/2021   Pharyngitis 07/03/2021   Chronic pharyngeal candidiasis 07/03/2021   Other acute recurrent sinusitis 06/17/2021   Diaphoresis 05/28/2021   Flushes 05/28/2021   Chronic vaginitis 01/21/2021   COPD (chronic obstructive pulmonary disease) ([place]) 01/19/2021   Aneurysm of infrarenal abdominal aorta ([place]) 09/05/2020   Hypothyroidism (acquired) 07/10/2020   Hypertension complicating diabetes ([place]) 07/10/2020   Ocular migraine with status migrainosus 07/10/2020   Essential hypertension 06/15/2019   Major depressive disorder, recurrent episode, mild ([place]) 09/10/2018   Mixed conductive and sensorineural hearing loss of left ear with restricted hearing of right ear 01/24/2017   Meniere's disease in remission, bilateral 01/24/2017   Lumbar back pain 01/05/2010   TOBACCO USER 11/25/2007   Dyslipidemia 06/05/2006   Anxiety 06/05/2006   Diverticulosis of colon 06/05/2006    Patient's Medications  New Prescriptions   No medications on file  Previous Medications   ACETAMINOPHEN (TYLENOL) 500 MG TABLET    Take 500-1,000 mg by mouth every 6 (six) hours as needed for moderate pain.   ALBUTEROL (VENTOLIN HFA) 108 (90 BASE) MCG/ACT INHALER    Inhale 2 puffs into the lungs every 6 (six) hours as needed for shortness of  breath or wheezing.   ALPRAZOLAM (XANAX) 0.25 MG TABLET    TAKE 1 TABLET(0.25 MG) BY MOUTH THREE TIMES DAILY   ATORVASTATIN (LIPITOR) 20 MG TABLET    TAKE 1 TABLET(20 MG) BY MOUTH DAILY   CARBOXYMETHYLCELLUL-GLYCERIN (LUBRICATING EYE DROPS OP)    Place 1 drop into both eyes daily as needed (dry eyes).   CARVEDILOL (COREG) 12.5 MG TABLET    TAKE 1 TABLET(12.5 MG) BY MOUTH TWICE DAILY WITH A MEAL   CETIRIZINE (ZYRTEC) 10 MG TABLET    Take 10 mg by mouth daily as needed for allergies.   CHLORTHALIDONE (HYGROTON) 25 MG TABLET    Take 25 mg by mouth daily.   CONJUGATED ESTROGENS (PREMARIN) VAGINAL CREAM    Place 1 applicator vaginally daily as needed (dryness / burning).   CYCLOBENZAPRINE (FLEXERIL) 10 MG TABLET    Take 10 mg by mouth 3 (three) times daily as needed for muscle spasms.   DICLOFENAC SODIUM (VOLTAREN) 1 % GEL    diclofenac 1 % topical gel   DICYCLOMINE (BENTYL) 20 MG TABLET    Take 1 tablet (20 mg total) by mouth 2 (two) times daily as needed for spasms.   ERENUMAB-AOOE (AIMOVIG) 140 MG/ML SOAJ    Inject 140 mg as directed every 30 (thirty) days.   ESTRADIOL (ESTRACE) 0.1 MG/GM VAGINAL CREAM    Place 1 Applicatorful vaginally at bedtime. Daily for 14 days then twice weekly   FAMOTIDINE (PEPCID) 40 MG TABLET    TAKE 1 TABLET(40 MG) BY MOUTH AT BEDTIME   FLUOXETINE (PROZAC) 20 MG CAPSULE    Take 2 capsules (40 mg  total) by mouth daily.   FLUTICASONE (FLONASE) 50 MCG/ACT NASAL SPRAY    SHAKE LIQUID AND USE 2 SPRAYS IN EACH NOSTRIL DAILY   HYDROCODONE-ACETAMINOPHEN (NORCO/VICODIN) 5-325 MG TABLET    Take 1 tablet by mouth 2 (two) times daily as needed for severe pain (OSteoarthritis of lumbar spine.).   ITRACONAZOLE (SPORANOX) 100 MG CAPSULE    Take 2 capsules (200 mg total) by mouth 2 (two) times daily.   KETOCONAZOLE (NIZORAL) 2 % CREAM    Apply 1 application. topically daily.   LINACLOTIDE (LINZESS) 145 MCG CAPS CAPSULE    Take 1 capsule (145 mcg total) by mouth daily before breakfast.    MONTELUKAST (SINGULAIR) 10 MG TABLET    TAKE 1 TABLET(10 MG) BY MOUTH AT BEDTIME   MULTIPLE VITAMINS-MINERALS (MULTIVITAMIN WITH IRON-MINERALS) LIQUID    Take 15 mLs by mouth daily.   ONDANSETRON (ZOFRAN) 4 MG TABLET    TAKE 1 TABLET(4 MG) BY MOUTH EVERY 8 HOURS AS NEEDED FOR NAUSEA OR VOMITING   PANTOPRAZOLE (PROTONIX) 40 MG TABLET    TAKE 1 TABLET(40 MG) BY MOUTH DAILY   POTASSIUM CHLORIDE (KLOR-CON M) 10 MEQ TABLET    Take 2 tablets (20 mEq total) by mouth 2 (two) times daily.   POTASSIUM CHLORIDE (MICRO-K) 10 MEQ CR CAPSULE    Take 2 capsules (20 mEq total) by mouth 2 (two) times daily.   PROMETHAZINE (PHENERGAN) 25 MG TABLET    TAKE 1 TABLET BY MOUTH EVERY 8 HOURS IF NEEDED   RIZATRIPTAN (MAXALT) 10 MG TABLET    Take 1 tablet (10 mg total) by mouth as needed for migraine. May repeat in 2 hours if needed   TIOTROPIUM BROMIDE-OLODATEROL (STIOLTO RESPIMAT) 2.5-2.5 MCG/ACT AERS    Inhale 2 puffs into the lungs daily.   VITAMIN B-12 (CYANOCOBALAMIN) 500 MCG TABLET    TAKE 1 TABLET BY MOUTH DAILY AT NOON  Modified Medications   No medications on file  Discontinued Medications   No medications on file    Subjective: 72 year old female with past medical history, including tobacco abuse, COPD,  as below presents for management of pulmonary aspergillosis.  She had a PET/CT done on 9/12 showing bilateral pulmonary nodule with variable FDG accumulation up to low-level hypermetabolism.  Overall appearance is generally different than on the 08/25/2020 exam.  Noted upper lobe predominance favors atypical infection.  She underwent bronchoscopy with Dr. Verlee Monte on 09/28/21, bacterial(NG), fungal(NG, stain -), AFB cultures(smear -, NG), with path obtained.  Mulitple nodes biopsied with path of right upper lobe via needle biopsy showed fungal organisms present consistent with Aspergillus.  Imaging progression: Low-dose CT on 05/06/2020 was benign.  Repeat CT 12 months LD CT on 05/12/21 showed new irregular 14.4 mm  focus of consolidation right upper lobe, new 4.2 mm solid right middle lobe pulmonary nodule.  Recommended low-dose CT without contrast in 3 months. Low-dose screening CT on 8/25 whosed regression of the previously noted lesion of concern near the apex of the right upper lobe, but multiple new lesions scattered throughout the lungs bilaterally which are highly aggressive in appearance PET on 09/11/21 showed 10 mm left upper lobe nodule , Index cavitary nodule right upper lobe measuring 10 mm , several tiny nodules throughout.     10/16/21: patient just denies worsening cough, shortness of breath, hemoptysis.  She denies fevers and chills.  She reports she has been vaping but quit about 2 months ago.  She has been smoking for about 40 years.  Due to some life circumstances she had been smoking up to a pack a day this year but now has cut down to about 12 cigarettes a day.She reports using a walker for back pain.    01/08/21: Pt is on room air. No new complaints.    01/31/22: stopped vori due to side effect. Pt reprotstwo hours after first dose, she same lights blinking in the dark. She had flashes of light for caouple of houts.  Same thing after the nest two doses and antifungalstopped.      Today 03/14/22: No new complaints. Tolerating itraconazole Review of Systems: Review of Systems  All other systems reviewed and are negative.   Past Medical History:  Diagnosis Date   Acute blood loss anemia ([facility]) 09/05/2020   Allergy    ANXIETY 06/05/2006   Arthritis    SHOULDERS    BACK PAIN 01/05/2010   Cataract    bilateral, left worse   Centrilobular emphysema (HCC)    COPD (chronic obstructive pulmonary disease) ([place])    Depression    Diabetes mellitus without complication ([place])    no meds, diet controllled   DIVERTICULOSIS, COLON 06/05/2006   DIZZINESS OR VERTIGO 06/05/2006   positional   Hearing loss in left ear    no hearing aid   History of blood transfusion 08/2020   History of kidney  stones    passed stones   HYPERLIPIDEMIA 06/05/2006   Hypertension    HYPOKALEMIA 12/14/2008   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   Qualifier: Diagnosis of  By: Burnice Logan  MD, Doretha Sou    Migraine    TOBACCO USER 11/25/2007    Social History   Tobacco Use   Smoking status: Every Day    Packs/day: 0.50    Years: 49.00    Total pack years: 24.50    Types: Cigarettes   Smokeless tobacco: Never   Tobacco comments:    3/4 ppd 08/30/21 [place], CMA  Vaping Use   Vaping Use: Former   Start date: 01/01/2010   Quit date: 08/01/2021   Substances: Nicotine   Devices: menthol/fruit flavor  Substance Use Topics   Alcohol use: No    Alcohol/week: 0.0 standard drinks of alcohol   Drug use: No    Family History  Problem Relation Age of Onset   Breast cancer Mother    Migraines Other    Depression Other    Anxiety disorder Other    High Cholesterol Other    Colon cancer Neg Hx    Rectal cancer Neg Hx    Stomach cancer Neg Hx    Colon polyps Neg Hx    Esophageal cancer Neg Hx     Allergies  Allergen Reactions   Erythromycin Base Diarrhea   Vfend [Voriconazole]     Visual changes   Penicillins Rash    Health Maintenance  Topic Date Due   FOOT EXAM  Never done   Zoster Vaccines- Shingrix (2 of 2) 06/22/2016   DTaP/Tdap/Td (2 - Td or Tdap) 05/17/2020   COVID-19 Vaccine (6 - 2023-24 season) 12/18/2021   HEMOGLOBIN A1C  07/26/2022   Medicare Annual Wellness (AWV)  08/17/2022   Lung Cancer Screening  08/26/2022   Diabetic kidney evaluation - eGFR measurement  01/26/2023   Diabetic kidney evaluation - Urine ACR  01/26/2023   OPHTHALMOLOGY EXAM  02/02/2023   MAMMOGRAM  03/06/2024   COLONOSCOPY (Pts 45-95yr Insurance coverage will need to be confirmed)  11/07/2026   Pneumonia Vaccine 72 Years old  Completed   INFLUENZA VACCINE  Completed   DEXA SCAN  Completed   Hepatitis C Screening  Completed   HPV VACCINES  Aged Out    Objective:  Vitals:   03/12/22 1400   Weight: 123 lb 9.6 oz (56.1 kg)   Body mass index is 24.14 kg/m.  Physical Exam Constitutional:      Appearance: Normal appearance.  HENT:     Head: Normocephalic and atraumatic.     Right Ear: Tympanic membrane normal.     Left Ear: Tympanic membrane normal.     Nose: Nose normal.     Mouth/Throat:     Mouth: Mucous membranes are moist.  Eyes:     Extraocular Movements: Extraocular movements intact.     Conjunctiva/sclera: Conjunctivae normal.     Pupils: Pupils are equal, round, and reactive to light.  Cardiovascular:     Rate and Rhythm: Normal rate and regular rhythm.     Heart sounds: No murmur heard.    No friction rub. No gallop.  Pulmonary:     Effort: Pulmonary effort is normal.     Breath sounds: Normal breath sounds.  Abdominal:     General: Abdomen is flat.     Palpations: Abdomen is soft.  Musculoskeletal:        General: Normal range of motion.  Skin:    General: Skin is warm and dry.  Neurological:     General: No focal deficit present.     Mental Status: She is alert and oriented to person, place, and time.  Psychiatric:        Mood and Affect: Mood normal.     Lab Results Lab Results  Component Value Date   WBC 10.3 01/25/2022   HGB 12.9 01/25/2022   HCT 38.4 01/25/2022   MCV 87 01/25/2022   PLT 375 01/25/2022    Lab Results  Component Value Date   CREATININE 0.53 (L) 01/25/2022   BUN 10 01/25/2022   NA 133 (L) 01/25/2022   K 3.8 01/25/2022   CL 90 (L) 01/25/2022   CO2 26 01/25/2022    Lab Results  Component Value Date   ALT 20 01/25/2022   AST 16 01/25/2022   ALKPHOS 72 01/25/2022   BILITOT 0.4 01/25/2022    Lab Results  Component Value Date   CHOL 144 01/25/2022   HDL 54 01/25/2022   LDLCALC 65 01/25/2022   LDLDIRECT 151.9 09/13/2011   TRIG 145 01/25/2022   CHOLHDL 2.7 01/25/2022   No results found for: "LABRPR", "RPRTITER" No results found for: "HIV1RNAQUANT", "HIV1RNAVL", "CD4TABS"   Problem List Items Addressed This  Visit   None  Assessment/Plan #Aspergillus cavitary nodule #Hx of vaping #tobacco abuse #COPD #Medication monitoring As part of yearly low-dose CT (patient qualified due to prolonged history of tobacco abuse) found to have irregular focus of consolidation right upper lobe and four-point.  Recommended follow-up 3 months CT low-dose CT on 8/25 showed regression of the previously noted lesion of concern near the apex of the right upper lobe, but multiple new lesions scattered throughout the lungs bilaterally which are highly aggressive in appearance.  As such PET ordered on 09/11/21  which showed 10 mm left upper lobe nodule , Index cavitary nodule right upper lobe measuring 10 mm , several tiny nodules throughout. She underwent bronch with bal and Bx on 9/28 right upper lobe cavitary nodule pathology returning for Aspergillus.  Fungal/AFB cultures have been negative. As single cavitary nodule+ aspergillus/3 nodules biopsied (with  many nodules on imaging) and no change in respiratory symptoms plan was to  monitor lung findings with repeat CT.   CT chest with contrast on 12/26 showed 2 new small nodules, findings similar to most recent prior CT with multiple lung nodules, with aggressive appearance consistent with aspergillosis.  Cavitary right upper lobe nodule noted on prior exam has decreased in size(7m).  Infrarenal abdominal aortic aneurysm measuring 3.1 cm emergency CT on 07/10/2021. -started vori but had blurry vision as such stopped -Started itra on 02/07/22. 02/09/22 ALP/AST/ALT 68/19/18, wbc 11.6, scr 0.50 Plan: - Started Itraconazole  200 bid. EKG today qtc 387 - Stop by lab tomorrow, 12 hrs from last dose of itra - F/U I one month, will order imaging at that time. For beginning of May(3 months on treatment)     #Smoking cessation -PT is not amenable smoking cessation   MLaurice Record MD [facility] 03/12/2022, 2:03 PM   I have  personally spent 42 minutes involved in face-to-face and non-face-to-face activities for this patient on the day of the visit. Professional time spent includes the following activities: Preparing to see the patient (review of tests), Obtaining and/or reviewing separately obtained history (admission/discharge record), Performing a medically appropriate examination and/or evaluation , Ordering medications/tests/procedures, referring and communicating with other health care professionals, Documenting clinical information in the EMR, Independently interpreting results (not separately reported), Communicating results to the patient/family/caregiver, Counseling and educating the patient/family/caregiver and Care coordination (not separately reported).

## 2022-03-13 DIAGNOSIS — B449 Aspergillosis, unspecified: Secondary | ICD-10-CM | POA: Diagnosis not present

## 2022-03-14 DIAGNOSIS — M5489 Other dorsalgia: Secondary | ICD-10-CM | POA: Diagnosis not present

## 2022-03-14 DIAGNOSIS — R2689 Other abnormalities of gait and mobility: Secondary | ICD-10-CM | POA: Diagnosis not present

## 2022-03-14 DIAGNOSIS — M6281 Muscle weakness (generalized): Secondary | ICD-10-CM | POA: Diagnosis not present

## 2022-03-15 LAB — LAB REPORT - SCANNED: EGFR (Non-African Amer.): 94

## 2022-03-20 ENCOUNTER — Telehealth: Payer: Self-pay

## 2022-03-20 NOTE — Telephone Encounter (Signed)
Patient called stating that she doesn't think she needs appointment on 4/15 due to not having CT scan done until May as told by Dr.Singh. Patient also requesting lab results done on 03/13/2022. Placed in your box.   [place], CMA

## 2022-03-21 DIAGNOSIS — M5489 Other dorsalgia: Secondary | ICD-10-CM | POA: Diagnosis not present

## 2022-03-21 DIAGNOSIS — R2689 Other abnormalities of gait and mobility: Secondary | ICD-10-CM | POA: Diagnosis not present

## 2022-03-21 DIAGNOSIS — M6281 Muscle weakness (generalized): Secondary | ICD-10-CM | POA: Diagnosis not present

## 2022-03-21 NOTE — Telephone Encounter (Signed)
Patient stated she isn't coming in on the 04/16/2022 - she can't afford co-pay. Patient stated that she doesn't have a smart phone so she's unable to do virtual visit but could do a phone visit and will get lab work done at [facility] in [place] when needed.    [place], CMA

## 2022-03-21 NOTE — Telephone Encounter (Signed)
Yes, it would be best if she could do an in person visit. We do need a f.u visit in a month given she is on multiple abx. If she can't do in person , we can do a video visit and she can get labs done. Labs from 03/13/22 stable.

## 2022-03-22 ENCOUNTER — Other Ambulatory Visit: Payer: Self-pay | Admitting: Family Medicine

## 2022-03-26 ENCOUNTER — Telehealth: Payer: Self-pay

## 2022-03-26 NOTE — Telephone Encounter (Signed)
Patient called, her CT is scheduled for 5/14 and follow up with Dr. Candiss Norse for 5/21.   She would like to know if she needs labs before then, if so she would like to have them done at [facility] on [facility]. In [place]. Will route to provider.   Beryle Flock, RN

## 2022-03-27 ENCOUNTER — Ambulatory Visit (INDEPENDENT_AMBULATORY_CARE_PROVIDER_SITE_OTHER): Payer: Medicare PPO | Admitting: Family Medicine

## 2022-03-27 VITALS — BP 130/70 | HR 74 | Temp 97.3°F | Ht 60.0 in | Wt 112.0 lb

## 2022-03-27 DIAGNOSIS — R300 Dysuria: Secondary | ICD-10-CM

## 2022-03-27 DIAGNOSIS — T148XXA Other injury of unspecified body region, initial encounter: Secondary | ICD-10-CM

## 2022-03-27 DIAGNOSIS — K5909 Other constipation: Secondary | ICD-10-CM | POA: Diagnosis not present

## 2022-03-27 DIAGNOSIS — J449 Chronic obstructive pulmonary disease, unspecified: Secondary | ICD-10-CM

## 2022-03-27 DIAGNOSIS — F17200 Nicotine dependence, unspecified, uncomplicated: Secondary | ICD-10-CM

## 2022-03-27 DIAGNOSIS — J018 Other acute sinusitis: Secondary | ICD-10-CM

## 2022-03-27 LAB — POCT URINALYSIS DIP (CLINITEK)
Bilirubin, UA: NEGATIVE
Blood, UA: NEGATIVE
Glucose, UA: NEGATIVE mg/dL
Ketones, POC UA: NEGATIVE mg/dL
Nitrite, UA: NEGATIVE
Spec Grav, UA: 1.015 (ref 1.010–1.025)
Urobilinogen, UA: 0.2 E.U./dL
pH, UA: 7 (ref 5.0–8.0)

## 2022-03-27 MED ORDER — SULFAMETHOXAZOLE-TRIMETHOPRIM 800-160 MG PO TABS: 1.0000 | ORAL_TABLET | Freq: Two times a day (BID) | ORAL | 0 refills | Status: DC

## 2022-03-27 MED ORDER — SENNA-DOCUSATE SODIUM 8.6-50 MG PO TABS: 1.0000 | ORAL_TABLET | Freq: Every day | ORAL | 3 refills | Status: DC

## 2022-03-27 NOTE — Progress Notes (Signed)
Acute Office Visit  Subjective:    Patient ID: Allison Jimenez, female    DOB: November 01, 1950, 72 y.o.   MRN: [id number]  Chief Complaint  Patient presents with   URI   Urinary Tract Infection    HPI: Patient is in today for urinary symptoms started 1.5 weeks ago c/o dysuria, denies urgency or frequency, has chronic lower back pain.   Also states she has yellow and greenish congestion mostly at night, no facial pain or pressure, sore throat or coughing. Does have hoarseness. Uses flonase. Taking singulair. No bloody noses.  Constipation: Patient is on Miralax.  Patient is complaining of bruising .   Past Medical History:  Diagnosis Date   Acute blood loss anemia (ABLA) 09/05/2020   Allergy    ANXIETY 06/05/2006   Arthritis    SHOULDERS    BACK PAIN 01/05/2010   Cataract    bilateral, left worse   Centrilobular emphysema (HCC)    COPD (chronic obstructive pulmonary disease) ([place])    Depression    Diabetes mellitus without complication (HCC)    no meds, diet controllled   DIVERTICULOSIS, COLON 06/05/2006   DIZZINESS OR VERTIGO 06/05/2006   positional   Hearing loss in left ear    no hearing aid   History of blood transfusion 08/2020   History of kidney stones    passed stones   HYPERLIPIDEMIA 06/05/2006   Hypertension    HYPOKALEMIA 12/14/2008   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   Qualifier: Diagnosis of  By: Burnice Logan  MD, Doretha Sou    Migraine    TOBACCO USER 11/25/2007    Past Surgical History:  Procedure Laterality Date   APPENDECTOMY     BRONCHIAL BIOPSY  09/28/2021   Procedure: BRONCHIAL BIOPSIES;  Surgeon: [name], MD;  Location: [facility] ENDOSCOPY;  Service: Pulmonary;;   BRONCHIAL NEEDLE ASPIRATION BIOPSY  09/28/2021   Procedure: BRONCHIAL NEEDLE ASPIRATION BIOPSIES;  Surgeon: [name], MD;  Location: [facility] ENDOSCOPY;  Service: Pulmonary;;   BRONCHIAL WASHINGS  09/28/2021   Procedure: BRONCHIAL WASHINGS;  Surgeon: [name], MD;   Location: [facility] ENDOSCOPY;  Service: Pulmonary;;   CESAREAN SECTION     x1   CHOLECYSTECTOMY     COLONOSCOPY     COSMETIC SURGERY     mastoid surgery  1992   shunt in mastoid- endolymphatic sac in left ear    MRI  07/11/2020   x several, last one located in [facility] ENDOSCOPY     vocal cord surgery     nodule x2    Family History  Problem Relation Age of Onset   Breast cancer Mother    Migraines Other    Depression Other    Anxiety disorder Other    High Cholesterol Other    Colon cancer Neg Hx    Rectal cancer Neg Hx    Stomach cancer Neg Hx    Colon polyps Neg Hx    Esophageal cancer Neg Hx     Social History   Socioeconomic History   Marital status: Divorced    Spouse name: Not on file   Number of children: 1   Years of education: 13   Highest education level: Not on file  Occupational History    Employer: [employer]    Comment: Retired  Tobacco Use   Smoking status: Every Day    Packs/day: 0.50    Years: 49.00  Additional pack years: 0.00    Total pack years: 24.50    Types: Cigarettes   Smokeless tobacco: Never   Tobacco comments:    3/4 ppd 08/30/21 Tilden Dome, CMA  Vaping Use   Vaping Use: Former   Start date: 01/01/2010   Quit date: 08/01/2021   Substances: Nicotine   Devices: menthol/fruit flavor  Substance and Sexual Activity   Alcohol use: No    Alcohol/week: 0.0 standard drinks of alcohol   Drug use: No   Sexual activity: Not Currently    Birth control/protection: Post-menopausal  Other Topics Concern   Not on file  Social History Narrative   Patient lives at home alone. Patient has a Data processing manager. Patient is divorced .   Patient is retired.   Education high school.   Right handed.   Caffeine none.   Social Determinants of Health   Financial Resource Strain: Not on file  Food Insecurity: Not on file  Transportation Needs: Not on file  Physical Activity: Not on file  Stress: Not on  file  Social Connections: Not on file  Intimate Partner Violence: Not on file    Outpatient Medications Prior to Visit  Medication Sig Dispense Refill   acetaminophen (TYLENOL) 500 MG tablet Take 500-1,000 mg by mouth every 6 (six) hours as needed for moderate pain.     albuterol (VENTOLIN HFA) 108 (90 Base) MCG/ACT inhaler Inhale 2 puffs into the lungs every 6 (six) hours as needed for shortness of breath or wheezing.     ALPRAZolam (XANAX) 0.25 MG tablet TAKE 1 TABLET(0.25 MG) BY MOUTH THREE TIMES DAILY 90 tablet 0   atorvastatin (LIPITOR) 20 MG tablet TAKE 1 TABLET(20 MG) BY MOUTH DAILY 90 tablet 1   Carboxymethylcellul-Glycerin (LUBRICATING EYE DROPS OP) Place 1 drop into both eyes daily as needed (dry eyes).     carvedilol (COREG) 12.5 MG tablet TAKE 1 TABLET(12.5 MG) BY MOUTH TWICE DAILY WITH A MEAL 180 tablet 0   cetirizine (ZYRTEC) 10 MG tablet Take 10 mg by mouth daily as needed for allergies.     chlorthalidone (HYGROTON) 25 MG tablet Take 25 mg by mouth daily.     conjugated estrogens (PREMARIN) vaginal cream Place 1 applicator vaginally daily as needed (dryness / burning).     cyclobenzaprine (FLEXERIL) 10 MG tablet Take 10 mg by mouth 3 (three) times daily as needed for muscle spasms.     diclofenac Sodium (VOLTAREN) 1 % GEL diclofenac 1 % topical gel (Patient taking differently: Apply 1 Application topically 4 (four) times daily as needed (pain).) 100 g 0   dicyclomine (BENTYL) 20 MG tablet Take 1 tablet (20 mg total) by mouth 2 (two) times daily as needed for spasms. 180 tablet 1   Erenumab-aooe (AIMOVIG) 140 MG/ML SOAJ Inject 140 mg as directed every 30 (thirty) days.     estradiol (ESTRACE) 0.1 MG/GM vaginal cream Place 1 Applicatorful vaginally at bedtime. Daily for 14 days then twice weekly 42.5 g 0   famotidine (PEPCID) 40 MG tablet TAKE 1 TABLET(40 MG) BY MOUTH AT BEDTIME 90 tablet 3   FLUoxetine (PROZAC) 20 MG capsule Take 2 capsules (40 mg total) by mouth daily. 180  capsule 3   fluticasone (FLONASE) 50 MCG/ACT nasal spray SHAKE LIQUID AND USE 2 SPRAYS IN EACH NOSTRIL DAILY 16 g 6   HYDROcodone-acetaminophen (NORCO/VICODIN) 5-325 MG tablet Take 1 tablet by mouth 2 (two) times daily as needed for severe pain (OSteoarthritis of lumbar spine.). 20 tablet 0  itraconazole (SPORANOX) 100 MG capsule Take 2 capsules (200 mg total) by mouth 2 (two) times daily. 120 capsule 3   ketoconazole (NIZORAL) 2 % cream Apply 1 application. topically daily. (Patient taking differently: Apply 1 application  topically daily as needed (cracked skin).) 15 g 0   linaclotide (LINZESS) 145 MCG CAPS capsule Take 1 capsule (145 mcg total) by mouth daily before breakfast. (Patient taking differently: Take 145 mcg by mouth daily as needed (constipation).) 8 capsule 0   montelukast (SINGULAIR) 10 MG tablet TAKE 1 TABLET(10 MG) BY MOUTH AT BEDTIME 90 tablet 2   Multiple Vitamins-Minerals (MULTIVITAMIN WITH IRON-MINERALS) liquid Take 15 mLs by mouth daily.     ondansetron (ZOFRAN) 4 MG tablet TAKE 1 TABLET(4 MG) BY MOUTH EVERY 8 HOURS AS NEEDED FOR NAUSEA OR VOMITING 90 tablet 0   pantoprazole (PROTONIX) 40 MG tablet TAKE 1 TABLET(40 MG) BY MOUTH DAILY 90 tablet 0   potassium chloride (MICRO-K) 10 MEQ CR capsule Take 2 capsules (20 mEq total) by mouth 2 (two) times daily. 360 capsule 1   promethazine (PHENERGAN) 25 MG tablet TAKE 1 TABLET BY MOUTH EVERY 8 HOURS IF NEEDED 20 tablet 0   rizatriptan (MAXALT) 10 MG tablet Take 1 tablet (10 mg total) by mouth as needed for migraine. May repeat in 2 hours if needed 10 tablet 0   Tiotropium Bromide-Olodaterol (STIOLTO RESPIMAT) 2.5-2.5 MCG/ACT AERS INHALE 2 PUFFS INTO THE LUNGS DAILY 4 g 2   vitamin B-12 (CYANOCOBALAMIN) 500 MCG tablet TAKE 1 TABLET BY MOUTH DAILY AT NOON 90 tablet 1   potassium chloride (KLOR-CON M) 10 MEQ tablet Take 2 tablets (20 mEq total) by mouth 2 (two) times daily. 360 tablet 0   No facility-administered medications prior to  visit.    Allergies  Allergen Reactions   Erythromycin Base Diarrhea   Vfend [Voriconazole]     Visual changes   Penicillins Rash    Review of Systems  Constitutional:  Negative for chills, fatigue and fever.  HENT:  Positive for congestion. Negative for ear pain, sinus pressure, sinus pain and sore throat.   Respiratory:  Negative for cough and shortness of breath.   Gastrointestinal:  Negative for diarrhea and nausea.  Genitourinary:  Positive for dysuria and frequency. Negative for urgency.  Musculoskeletal:  Positive for back pain.  Neurological:  Negative for headaches.       Objective:        03/27/2022   11:09 AM 03/12/2022    2:00 PM 02/12/2022    3:30 PM  Vitals with BMI  Height 5\' 0"   5\' 0"   Weight 112 lbs 123 lbs 10 oz 125 lbs  BMI [id number]  [id number]  Systolic [id number]  Diastolic 70 75 70  Pulse 74 66 70    No data found.   Physical Exam Vitals reviewed.  Constitutional:      Appearance: Normal appearance. She is normal weight.  HENT:     Right Ear: Tympanic membrane, ear canal and external ear normal.     Left Ear: Tympanic membrane and external ear normal.     Nose: Congestion present.     Mouth/Throat:     Pharynx: Oropharynx is clear.  Cardiovascular:     Rate and Rhythm: Normal rate and regular rhythm.     Pulses: Normal pulses.     Heart sounds: Normal heart sounds. No murmur heard. Pulmonary:     Effort: Pulmonary effort is normal. No respiratory distress.  Breath sounds: Normal breath sounds.  Abdominal:     General: Abdomen is flat. Bowel sounds are normal.     Palpations: Abdomen is soft.     Tenderness: There is no abdominal tenderness.  Neurological:     Mental Status: She is alert and oriented to person, place, and time.  Psychiatric:        Mood and Affect: Mood normal.        Behavior: Behavior normal.     Health Maintenance Due  Topic Date Due   FOOT EXAM  Never done   Zoster Vaccines- Shingrix (2 of 2) 06/22/2016    DTaP/Tdap/Td (2 - Td or Tdap) 05/17/2020   COVID-19 Vaccine (6 - 2023-24 season) 12/18/2021    There are no preventive care reminders to display for this patient.   Lab Results  Component Value Date   TSH 2.620 01/25/2022   Lab Results  Component Value Date   WBC 18.7 (H) 03/27/2022   HGB 13.4 03/27/2022   HCT 39.7 03/27/2022   MCV 86 03/27/2022   PLT 298 03/27/2022   Lab Results  Component Value Date   NA 129 (L) 03/27/2022   K 3.8 03/27/2022   CO2 26 03/27/2022   GLUCOSE 93 03/27/2022   BUN 14 03/27/2022   CREATININE 0.55 (L) 03/27/2022   BILITOT 0.5 03/27/2022   ALKPHOS 53 03/27/2022   AST 18 03/27/2022   ALT 39 (H) 03/27/2022   PROT 6.3 03/27/2022   ALBUMIN 4.1 03/27/2022   CALCIUM 9.3 03/27/2022   EGFR 99 03/27/2022   GFR 75.48 07/09/2016   Lab Results  Component Value Date   CHOL 144 01/25/2022   Lab Results  Component Value Date   HDL 54 01/25/2022   Lab Results  Component Value Date   LDLCALC 65 01/25/2022   Lab Results  Component Value Date   TRIG 145 01/25/2022   Lab Results  Component Value Date   CHOLHDL 2.7 01/25/2022   Lab Results  Component Value Date   HGBA1C 6.4 (H) 01/25/2022       Assessment & Plan:  Chronic obstructive pulmonary disease, unspecified COPD type ([place]) Assessment & Plan: Wants to try Trelegy inhaler again. Aware to rinse mouth after every use. Has samples.   Bruising Assessment & Plan: Check labs.  Orders: -     CBC with Differential/Platelet -     Comprehensive metabolic panel -     Protime-INR -     APTT  Dysuria Assessment & Plan: Checked UA and Ordered Urine culture. start on bactrim ds one twice daily x 7 days   Orders: -     POCT URINALYSIS DIP (CLINITEK) -     Urine Culture -     Sulfamethoxazole-Trimethoprim; Take 1 tablet by mouth 2 (two) times daily.  Dispense: 14 tablet; Refill: 0  Other constipation Assessment & Plan: Recommend PERICOLACE (senna + dulcolax) combination. Stop  miralax (may be causing gas.)  Orders: -     Senna-Docusate Sodium; Take 1 tablet by mouth daily.  Dispense: 30 tablet; Refill: 3  TOBACCO USER Assessment & Plan: Nicoderm patch 21 mcg once daily x 1 month, then 14 mcg once daily x 2 weeks, then 7 mcg once daily x 2 weeks.  You may supplement patches with nicotine lozenges or gum.    Acute non-recurrent sinusitis of other sinus Assessment & Plan: Prescription: Bactrim DS  Orders: -     Sulfamethoxazole-Trimethoprim; Take 1 tablet by mouth 2 (two) times daily.  Dispense: 14 tablet; Refill: 0     Meds ordered this encounter  Medications   sulfamethoxazole-trimethoprim (BACTRIM DS) 800-160 MG tablet    Sig: Take 1 tablet by mouth 2 (two) times daily.    Dispense:  14 tablet    Refill:  0   sennosides-docusate sodium (SENOKOT-S) 8.6-50 MG tablet    Sig: Take 1 tablet by mouth daily.    Dispense:  30 tablet    Refill:  3    Orders Placed This Encounter  Procedures   Urine Culture   CBC with Differential/Platelet   Comprehensive metabolic panel   Protime-INR   APTT   POCT URINALYSIS DIP (CLINITEK)     Follow-up: No follow-ups on file.  An After Visit Summary was printed and given to the patient.   Geralynn Ochs I Leal-Borjas,acting as a scribe for Rochel Brome, MD.,have documented all relevant documentation on the behalf of Rochel Brome, MD,as directed by  Rochel Brome, MD while in the presence of Rochel Brome, MD.    Rochel Brome, MD [place] [phone]

## 2022-03-27 NOTE — Patient Instructions (Addendum)
For constipation: Recommend PERICOLACE (senna + dulcolax) combination. Stop miralax (may be causing gas.)  For sinus infection and urinary tract infection: start on bactrim ds one twice daily x 7 days  For quitting smoking: Nicoderm patch 21 mcg once daily x 1 month, then 14 mcg once daily x 2 weeks, then 7 mcg once daily x 2 weeks.  You may supplement patches with nicotine lozenges or gum.  Allison Alexanders! I am very excited for you.   I am checking labs for bruising. I am concerned it is the fungal medicine, but you need to complete course.   COPD: change to trelegy. Rinse mouth out.

## 2022-03-27 NOTE — Assessment & Plan Note (Addendum)
Wants to try Trelegy inhaler again. Aware to rinse mouth after every use. Has samples.

## 2022-03-28 DIAGNOSIS — M47816 Spondylosis without myelopathy or radiculopathy, lumbar region: Secondary | ICD-10-CM | POA: Diagnosis not present

## 2022-03-28 LAB — COMPREHENSIVE METABOLIC PANEL
ALT: 39 IU/L — ABNORMAL HIGH (ref 0–32)
AST: 18 IU/L (ref 0–40)
Albumin/Globulin Ratio: 1.9 (ref 1.2–2.2)
Albumin: 4.1 g/dL (ref 3.9–4.9)
Alkaline Phosphatase: 53 IU/L (ref 44–121)
BUN/Creatinine Ratio: 25 (ref 12–28)
BUN: 14 mg/dL (ref 8–27)
Bilirubin Total: 0.5 mg/dL (ref 0.0–1.2)
CO2: 26 mmol/L (ref 20–29)
Calcium: 9.3 mg/dL (ref 8.7–10.3)
Chloride: 87 mmol/L — ABNORMAL LOW (ref 96–106)
Creatinine, Ser: 0.55 mg/dL — ABNORMAL LOW (ref 0.57–1.00)
Globulin, Total: 2.2 g/dL (ref 1.5–4.5)
Glucose: 93 mg/dL (ref 70–99)
Potassium: 3.8 mmol/L (ref 3.5–5.2)
Sodium: 129 mmol/L — ABNORMAL LOW (ref 134–144)
Total Protein: 6.3 g/dL (ref 6.0–8.5)
eGFR: 99 mL/min/{1.73_m2} (ref >59)

## 2022-03-28 LAB — CBC WITH DIFFERENTIAL/PLATELET
Basophils Absolute: 0 10*3/uL (ref 0.0–0.2)
Basos: 0 %
EOS (ABSOLUTE): 0.1 10*3/uL (ref 0.0–0.4)
Eos: 0 %
Hematocrit: 39.7 % (ref 34.0–46.6)
Hemoglobin: 13.4 g/dL (ref 11.1–15.9)
Immature Grans (Abs): 0.3 10*3/uL — ABNORMAL HIGH (ref 0.0–0.1)
Immature Granulocytes: 2 %
Lymphocytes Absolute: 2.5 10*3/uL (ref 0.7–3.1)
Lymphs: 14 %
MCH: 29.1 pg (ref 26.6–33.0)
MCHC: 33.8 g/dL (ref 31.5–35.7)
MCV: 86 fL (ref 79–97)
Monocytes Absolute: 1.2 10*3/uL — ABNORMAL HIGH (ref 0.1–0.9)
Monocytes: 7 %
Neutrophils Absolute: 14.5 10*3/uL — ABNORMAL HIGH (ref 1.4–7.0)
Neutrophils: 77 %
Platelets: 298 10*3/uL (ref 150–450)
RBC: 4.6 x10E6/uL (ref 3.77–5.28)
RDW: 14.2 % (ref 11.7–15.4)
WBC: 18.7 10*3/uL — ABNORMAL HIGH (ref 3.4–10.8)

## 2022-03-28 LAB — URINE CULTURE: Organism ID, Bacteria: NO GROWTH

## 2022-03-28 LAB — APTT: aPTT: 24 s (ref 24–33)

## 2022-03-28 LAB — PROTIME-INR
INR: 0.9 (ref 0.9–1.2)
Prothrombin Time: 9.8 s (ref 9.1–12.0)

## 2022-04-01 ENCOUNTER — Encounter: Payer: Self-pay | Admitting: Family Medicine

## 2022-04-01 DIAGNOSIS — T148XXA Other injury of unspecified body region, initial encounter: Secondary | ICD-10-CM | POA: Insufficient documentation

## 2022-04-01 DIAGNOSIS — N3001 Acute cystitis with hematuria: Secondary | ICD-10-CM | POA: Insufficient documentation

## 2022-04-01 DIAGNOSIS — R300 Dysuria: Secondary | ICD-10-CM | POA: Insufficient documentation

## 2022-04-01 NOTE — Assessment & Plan Note (Addendum)
Checked UA and Ordered Urine culture. start on bactrim ds one twice daily x 7 days

## 2022-04-01 NOTE — Assessment & Plan Note (Signed)
Nicoderm patch 21 mcg once daily x 1 month, then 14 mcg once daily x 2 weeks, then 7 mcg once daily x 2 weeks.  You may supplement patches with nicotine lozenges or gum.

## 2022-04-01 NOTE — Assessment & Plan Note (Signed)
Recommend PERICOLACE (senna + dulcolax) combination. Stop miralax (may be causing gas.)

## 2022-04-01 NOTE — Assessment & Plan Note (Signed)
Check labs 

## 2022-04-01 NOTE — Assessment & Plan Note (Signed)
Prescription: Bactrim DS

## 2022-04-04 ENCOUNTER — Other Ambulatory Visit: Payer: Self-pay | Admitting: Family Medicine

## 2022-04-04 ENCOUNTER — Telehealth: Payer: Self-pay

## 2022-04-04 DIAGNOSIS — R300 Dysuria: Secondary | ICD-10-CM

## 2022-04-04 DIAGNOSIS — J018 Other acute sinusitis: Secondary | ICD-10-CM

## 2022-04-04 MED ORDER — SULFAMETHOXAZOLE-TRIMETHOPRIM 800-160 MG PO TABS: 1.0000 | ORAL_TABLET | Freq: Two times a day (BID) | ORAL | 0 refills | Status: DC

## 2022-04-04 NOTE — Telephone Encounter (Signed)
Allison Jimenez called requesting a refill on Bactrim.  She continues to blow out yellowish-green drainage.  Dr. Tobie Poet approved the medication. Patient was notified.

## 2022-04-05 ENCOUNTER — Telehealth: Payer: Self-pay

## 2022-04-05 NOTE — Telephone Encounter (Signed)
Patient called, requested that her itraconazole either be stopped or dose reduced.   Reports she has swelling in her right foot and has developed urinary hesitancy. Of note, she has spinal "anti-inflammatory" injection last week due to back pain.   Will route to provider.   Beryle Flock, RN

## 2022-04-06 ENCOUNTER — Telehealth: Payer: Self-pay

## 2022-04-06 DIAGNOSIS — M47816 Spondylosis without myelopathy or radiculopathy, lumbar region: Secondary | ICD-10-CM | POA: Diagnosis not present

## 2022-04-06 DIAGNOSIS — M545 Low back pain, unspecified: Secondary | ICD-10-CM | POA: Diagnosis not present

## 2022-04-06 NOTE — Telephone Encounter (Signed)
Patient called for update, states she has only been taking two pills of her itraconazole a day instead of four. Still endorses urinary hesitancy and right foot swelling.   Sandie Ano, RN

## 2022-04-06 NOTE — Telephone Encounter (Signed)
Patient called this morning requesting an appointment for lower back pain and her blood pressure is currently 160/60. Patient stated that she is a little dizzy at times due to her medication but nothing more than normal. Patient denies any chest pain, shortness of breath, HA, blurred vision, or lightheadedness. Patient stated that she has nerve block done last week to help with her pain but is has been much worse. Patient stated that she has an appointment with that doctor at 11 today. Patient notified to contact the office of the doctor that did the nerve block this morning to see what recommendations they have. Patient notified to not wait until her appointment with them at 11. Note: I spoke with Eber Jones, RN about this patient who gave the recommendations.

## 2022-04-09 ENCOUNTER — Ambulatory Visit (INDEPENDENT_AMBULATORY_CARE_PROVIDER_SITE_OTHER): Payer: Medicare PPO | Admitting: Family Medicine

## 2022-04-09 ENCOUNTER — Other Ambulatory Visit: Payer: Self-pay | Admitting: Physician Assistant

## 2022-04-09 ENCOUNTER — Encounter: Payer: Self-pay | Admitting: Family Medicine

## 2022-04-09 VITALS — BP 148/78 | HR 93 | Temp 97.0°F | Ht 60.0 in | Wt 120.0 lb

## 2022-04-09 DIAGNOSIS — I152 Hypertension secondary to endocrine disorders: Secondary | ICD-10-CM | POA: Diagnosis not present

## 2022-04-09 DIAGNOSIS — F411 Generalized anxiety disorder: Secondary | ICD-10-CM | POA: Diagnosis not present

## 2022-04-09 DIAGNOSIS — R42 Dizziness and giddiness: Secondary | ICD-10-CM

## 2022-04-09 DIAGNOSIS — M8588 Other specified disorders of bone density and structure, other site: Secondary | ICD-10-CM

## 2022-04-09 DIAGNOSIS — M8008XA Age-related osteoporosis with current pathological fracture, vertebra(e), initial encounter for fracture: Secondary | ICD-10-CM

## 2022-04-09 DIAGNOSIS — S32010A Wedge compression fracture of first lumbar vertebra, initial encounter for closed fracture: Secondary | ICD-10-CM

## 2022-04-09 DIAGNOSIS — E1159 Type 2 diabetes mellitus with other circulatory complications: Secondary | ICD-10-CM | POA: Diagnosis not present

## 2022-04-09 MED ORDER — CARVEDILOL 25 MG PO TABS: 25.0000 mg | ORAL_TABLET | Freq: Two times a day (BID) | ORAL | 0 refills | Status: DC

## 2022-04-09 MED ORDER — FLUOXETINE HCL 20 MG PO CAPS: 60.0000 mg | ORAL_CAPSULE | Freq: Every day | ORAL | 0 refills | Status: DC

## 2022-04-09 NOTE — Assessment & Plan Note (Signed)
Increase carvedilol 25 mg twice daily.  Check bp and pulse daily.

## 2022-04-09 NOTE — Patient Instructions (Signed)
Hypertension: increase carvedilol to 25 mg twice daily.   Anxiety/Depression: increase prozac 20 mg 3 daily.

## 2022-04-09 NOTE — Progress Notes (Signed)
Acute Office Visit  Subjective:    Patient ID: Allison Jimenez, female    DOB: May 07, 1950, 72 y.o.   MRN: [id number]  Chief Complaint  Patient presents with   Hypertension    HPI: Patient is in today for elevated blood pressure. States balance is off due to pain. No dizziness, blurred vision, mild nausea and no headache. At home readings have 180/90, 188/90, 170/70, 174/88, 160/60 p80, 144/70, 160/70, 160/70, 180/80, 152/70, 160/74 p88, 172/80 p100, 180/90, 152/70, 154/74, 150/60.  Patient is very anxious.   Past Medical History:  Diagnosis Date   Acute blood loss anemia (ABLA) 09/05/2020   Allergy    ANXIETY 06/05/2006   Arthritis    SHOULDERS    BACK PAIN 01/05/2010   Cataract    bilateral, left worse   Centrilobular emphysema    COPD (chronic obstructive pulmonary disease)    Depression    Diabetes mellitus without complication    no meds, diet controllled   DIVERTICULOSIS, COLON 06/05/2006   DIZZINESS OR VERTIGO 06/05/2006   positional   Hearing loss in left ear    no hearing aid   History of blood transfusion 08/2020   History of kidney stones    passed stones   HYPERLIPIDEMIA 06/05/2006   Hypertension    HYPOKALEMIA 12/14/2008   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   Qualifier: Diagnosis of  By: Amador Cunas  MD, Janett Labella    Migraine    TOBACCO USER 11/25/2007    Past Surgical History:  Procedure Laterality Date   APPENDECTOMY     BRONCHIAL BIOPSY  09/28/2021   Procedure: BRONCHIAL BIOPSIES;  Surgeon: Omar Person, MD;  Location: [facility] ENDOSCOPY;  Service: Pulmonary;;   BRONCHIAL NEEDLE ASPIRATION BIOPSY  09/28/2021   Procedure: BRONCHIAL NEEDLE ASPIRATION BIOPSIES;  Surgeon: Omar Person, MD;  Location: [facility] ENDOSCOPY;  Service: Pulmonary;;   BRONCHIAL WASHINGS  09/28/2021   Procedure: BRONCHIAL WASHINGS;  Surgeon: Omar Person, MD;  Location: [facility] ENDOSCOPY;  Service: Pulmonary;;   CESAREAN SECTION     x1   CHOLECYSTECTOMY     COLONOSCOPY     COSMETIC  SURGERY     mastoid surgery  1992   shunt in mastoid- endolymphatic sac in left ear    MRI  07/11/2020   x several, last one located in CE   TUBAL LIGATION     UPPER GI ENDOSCOPY     vocal cord surgery     nodule x2    Family History  Problem Relation Age of Onset   Breast cancer Mother    Migraines Other    Depression Other    Anxiety disorder Other    High Cholesterol Other    Colon cancer Neg Hx    Rectal cancer Neg Hx    Stomach cancer Neg Hx    Colon polyps Neg Hx    Esophageal cancer Neg Hx     Social History   Socioeconomic History   Marital status: Divorced    Spouse name: Not on file   Number of children: 1   Years of education: 13   Highest education level: Not on file  Occupational History    Employer: [employer]    Comment: Retired  Tobacco Use   Smoking status: Every Day    Packs/day: 0.50    Years: 49.00    Additional pack years: 0.00    Total pack years: 24.50    Types: Cigarettes   Smokeless tobacco: Never  Tobacco comments:    3/4 ppd 08/30/21 Vernie MurdersLeslie Raskin, CMA  Vaping Use   Vaping Use: Former   Start date: 01/01/2010   Quit date: 08/01/2021   Substances: Nicotine   Devices: menthol/fruit flavor  Substance and Sexual Activity   Alcohol use: No    Alcohol/week: 0.0 standard drinks of alcohol   Drug use: No   Sexual activity: Not Currently    Birth control/protection: Post-menopausal  Other Topics Concern   Not on file  Social History Narrative   Patient lives at home alone. Patient has a Biochemist, clinicaltrade school education. Patient is divorced .   Patient is retired.   Education high school.   Right handed.   Caffeine none.   Social Determinants of Health   Financial Resource Strain: Not on file  Food Insecurity: Not on file  Transportation Needs: Not on file  Physical Activity: Not on file  Stress: Not on file  Social Connections: Not on file  Intimate Partner Violence: Not on file    Outpatient Medications Prior to Visit   Medication Sig Dispense Refill   acetaminophen (TYLENOL) 500 MG tablet Take 500-1,000 mg by mouth every 6 (six) hours as needed for moderate pain.     albuterol (VENTOLIN HFA) 108 (90 Base) MCG/ACT inhaler Inhale 2 puffs into the lungs every 6 (six) hours as needed for shortness of breath or wheezing.     ALPRAZolam (XANAX) 0.25 MG tablet TAKE 1 TABLET(0.25 MG) BY MOUTH THREE TIMES DAILY 90 tablet 0   atorvastatin (LIPITOR) 20 MG tablet TAKE 1 TABLET(20 MG) BY MOUTH DAILY 90 tablet 1   Carboxymethylcellul-Glycerin (LUBRICATING EYE DROPS OP) Place 1 drop into both eyes daily as needed (dry eyes).     cetirizine (ZYRTEC) 10 MG tablet Take 10 mg by mouth daily as needed for allergies.     chlorthalidone (HYGROTON) 25 MG tablet Take 25 mg by mouth daily.     conjugated estrogens (PREMARIN) vaginal cream Place 1 applicator vaginally daily as needed (dryness / burning).     cyclobenzaprine (FLEXERIL) 10 MG tablet Take 10 mg by mouth 3 (three) times daily as needed for muscle spasms.     diclofenac Sodium (VOLTAREN) 1 % GEL diclofenac 1 % topical gel (Patient taking differently: Apply 1 Application topically 4 (four) times daily as needed (pain).) 100 g 0   dicyclomine (BENTYL) 20 MG tablet Take 1 tablet (20 mg total) by mouth 2 (two) times daily as needed for spasms. 180 tablet 1   Erenumab-aooe (AIMOVIG) 140 MG/ML SOAJ Inject 140 mg as directed every 30 (thirty) days.     estradiol (ESTRACE) 0.1 MG/GM vaginal cream Place 1 Applicatorful vaginally at bedtime. Daily for 14 days then twice weekly 42.5 g 0   famotidine (PEPCID) 40 MG tablet TAKE 1 TABLET(40 MG) BY MOUTH AT BEDTIME 90 tablet 3   fluticasone (FLONASE) 50 MCG/ACT nasal spray SHAKE LIQUID AND USE 2 SPRAYS IN EACH NOSTRIL DAILY 16 g 6   HYDROcodone-acetaminophen (NORCO/VICODIN) 5-325 MG tablet Take 1 tablet by mouth 2 (two) times daily as needed for severe pain (OSteoarthritis of lumbar spine.). 20 tablet 0   itraconazole (SPORANOX) 100 MG  capsule Take 2 capsules (200 mg total) by mouth 2 (two) times daily. 120 capsule 3   ketoconazole (NIZORAL) 2 % cream Apply 1 application. topically daily. (Patient taking differently: Apply 1 application  topically daily as needed (cracked skin).) 15 g 0   linaclotide (LINZESS) 145 MCG CAPS capsule Take 1 capsule (145 mcg  total) by mouth daily before breakfast. (Patient taking differently: Take 145 mcg by mouth daily as needed (constipation).) 8 capsule 0   montelukast (SINGULAIR) 10 MG tablet TAKE 1 TABLET(10 MG) BY MOUTH AT BEDTIME 90 tablet 2   Multiple Vitamins-Minerals (MULTIVITAMIN WITH IRON-MINERALS) liquid Take 15 mLs by mouth daily.     ondansetron (ZOFRAN) 4 MG tablet TAKE 1 TABLET(4 MG) BY MOUTH EVERY 8 HOURS AS NEEDED FOR NAUSEA OR VOMITING 90 tablet 0   pantoprazole (PROTONIX) 40 MG tablet TAKE 1 TABLET(40 MG) BY MOUTH DAILY 90 tablet 0   potassium chloride (MICRO-K) 10 MEQ CR capsule Take 2 capsules (20 mEq total) by mouth 2 (two) times daily. 360 capsule 1   promethazine (PHENERGAN) 25 MG tablet TAKE 1 TABLET BY MOUTH EVERY 8 HOURS IF NEEDED 20 tablet 0   rizatriptan (MAXALT) 10 MG tablet Take 1 tablet (10 mg total) by mouth as needed for migraine. May repeat in 2 hours if needed 10 tablet 0   sennosides-docusate sodium (SENOKOT-S) 8.6-50 MG tablet Take 1 tablet by mouth daily. 30 tablet 3   sulfamethoxazole-trimethoprim (BACTRIM DS) 800-160 MG tablet Take 1 tablet by mouth 2 (two) times daily. 14 tablet 0   Tiotropium Bromide-Olodaterol (STIOLTO RESPIMAT) 2.5-2.5 MCG/ACT AERS INHALE 2 PUFFS INTO THE LUNGS DAILY 4 g 2   vitamin B-12 (CYANOCOBALAMIN) 500 MCG tablet TAKE 1 TABLET BY MOUTH DAILY AT NOON 90 tablet 1   carvedilol (COREG) 12.5 MG tablet TAKE 1 TABLET(12.5 MG) BY MOUTH TWICE DAILY WITH A MEAL 180 tablet 0   FLUoxetine (PROZAC) 20 MG capsule Take 2 capsules (40 mg total) by mouth daily. 180 capsule 3   No facility-administered medications prior to visit.    Allergies   Allergen Reactions   Erythromycin Base Diarrhea   Vfend [Voriconazole]     Visual changes   Penicillins Rash    Review of Systems  Constitutional:  Positive for fatigue. Negative for chills and fever.  HENT:  Negative for congestion, ear pain, rhinorrhea and sore throat.   Respiratory:  Positive for shortness of breath (when pain is bad.). Negative for cough.   Cardiovascular:  Negative for chest pain.  Gastrointestinal:  Positive for constipation (a little). Negative for abdominal pain, diarrhea, nausea and vomiting.  Genitourinary:  Negative for dysuria and urgency.       Urinary hesitancy  Musculoskeletal:  Positive for back pain. Negative for myalgias.  Neurological:  Positive for dizziness. Negative for weakness, light-headedness and headaches.  Psychiatric/Behavioral:  Negative for dysphoric mood. The patient is nervous/anxious.        Objective:        04/09/2022   11:26 AM 03/27/2022   11:09 AM 03/12/2022    2:00 PM  Vitals with BMI  Height 5\' 0"  5\' 0"    Weight 120 lbs 112 lbs 123 lbs 10 oz  BMI 23.44 21.87   Systolic 148 130 340  Diastolic 78 70 75  Pulse 93 74 66    No data found.   Physical Exam Vitals reviewed.  Constitutional:      Appearance: Normal appearance. She is normal weight.  Neck:     Vascular: No carotid bruit.  Cardiovascular:     Rate and Rhythm: Normal rate and regular rhythm.     Heart sounds: Normal heart sounds.  Pulmonary:     Effort: Pulmonary effort is normal. No respiratory distress.     Breath sounds: Normal breath sounds.  Abdominal:     General: Abdomen  is flat. Bowel sounds are normal.     Palpations: Abdomen is soft.     Tenderness: There is no abdominal tenderness.  Neurological:     Mental Status: She is alert and oriented to person, place, and time.  Psychiatric:        Mood and Affect: Mood normal.        Behavior: Behavior normal.     Health Maintenance Due  Topic Date Due   FOOT EXAM  Never done   Zoster  Vaccines- Shingrix (2 of 2) 06/22/2016   DTaP/Tdap/Td (2 - Td or Tdap) 05/17/2020   COVID-19 Vaccine (6 - 2023-24 season) 12/18/2021    There are no preventive care reminders to display for this patient.   Lab Results  Component Value Date   TSH 2.620 01/25/2022   Lab Results  Component Value Date   WBC 18.7 (H) 03/27/2022   HGB 13.4 03/27/2022   HCT 39.7 03/27/2022   MCV 86 03/27/2022   PLT 298 03/27/2022   Lab Results  Component Value Date   NA 129 (L) 03/27/2022   K 3.8 03/27/2022   CO2 26 03/27/2022   GLUCOSE 93 03/27/2022   BUN 14 03/27/2022   CREATININE 0.55 (L) 03/27/2022   BILITOT 0.5 03/27/2022   ALKPHOS 53 03/27/2022   AST 18 03/27/2022   ALT 39 (H) 03/27/2022   PROT 6.3 03/27/2022   ALBUMIN 4.1 03/27/2022   CALCIUM 9.3 03/27/2022   EGFR 99 03/27/2022   GFR 75.48 07/09/2016   Lab Results  Component Value Date   CHOL 144 01/25/2022   Lab Results  Component Value Date   HDL 54 01/25/2022   Lab Results  Component Value Date   LDLCALC 65 01/25/2022   Lab Results  Component Value Date   TRIG 145 01/25/2022   Lab Results  Component Value Date   CHOLHDL 2.7 01/25/2022   Lab Results  Component Value Date   HGBA1C 6.4 (H) 01/25/2022       Assessment & Plan:  Hypertension complicating diabetes Assessment & Plan: Increase carvedilol 25 mg twice daily.  Check bp and pulse daily.   Orders: -     Carvedilol; Take 1 tablet (25 mg total) by mouth 2 (two) times daily with a meal.  Dispense: 180 tablet; Refill: 0  Osteopenia of lumbar spine Assessment & Plan: Order dexa.   Orders: -     DG Bone Density; Future  Dizziness Assessment & Plan: Meniere's disease. On diuretic.   Orders: -     EKG 12-Lead  GAD (generalized anxiety disorder) Assessment & Plan: Increase prozac 20 mg 3 daily.  Orders: -     FLUoxetine HCl; Take 3 capsules (60 mg total) by mouth daily.  Dispense: 270 capsule; Refill: 0     Meds ordered this encounter   Medications   FLUoxetine (PROZAC) 20 MG capsule    Sig: Take 3 capsules (60 mg total) by mouth daily.    Dispense:  270 capsule    Refill:  0   carvedilol (COREG) 25 MG tablet    Sig: Take 1 tablet (25 mg total) by mouth 2 (two) times daily with a meal.    Dispense:  180 tablet    Refill:  0    Orders Placed This Encounter  Procedures   DG Bone Density   EKG 12-Lead     Follow-up: No follow-ups on file.  An After Visit Summary was printed and given to the patient.  Blane Ohara,  MD [place] [phone]

## 2022-04-09 NOTE — Assessment & Plan Note (Signed)
Increase prozac 20 mg 3 daily.

## 2022-04-09 NOTE — Assessment & Plan Note (Signed)
Meniere's disease. On diuretic.

## 2022-04-09 NOTE — Telephone Encounter (Signed)
Patient reports she has restarted the Itraconazole, but taking it 200 mg po BID instead of QID.  She reports her symptoms have improved with her taking it BID. Please advise if you would like her to go back to taking it QID. She also reports she is unable to come in for an appointment until 04/16/22. Meghanne Pletz Jonathon Resides, CMA

## 2022-04-09 NOTE — Telephone Encounter (Signed)
Called patient and offered her an appointment, she says she will have to call back once she talks with her PCP. Reports her BP has been very high and she has another fracture in her back, she thinks her PCP may have her go to the ER.   She is unable to do a virtual appointment.   Sandie Ano, RN

## 2022-04-09 NOTE — Assessment & Plan Note (Signed)
Order dexa.  

## 2022-04-10 ENCOUNTER — Other Ambulatory Visit: Payer: Medicare PPO

## 2022-04-11 NOTE — Telephone Encounter (Signed)
Sorry correction she is taking 100 mg BID. Is it ok to continue taking it this way?

## 2022-04-12 ENCOUNTER — Emergency Department (HOSPITAL_COMMUNITY): Payer: Medicare PPO

## 2022-04-12 ENCOUNTER — Emergency Department (HOSPITAL_COMMUNITY)
Admission: EM | Admit: 2022-04-12 | Discharge: 2022-04-12 | Disposition: A | Discharge status: Home / Self Care | Payer: Medicare PPO | Attending: Emergency Medicine | Admitting: Emergency Medicine

## 2022-04-12 ENCOUNTER — Encounter (HOSPITAL_COMMUNITY): Payer: Self-pay

## 2022-04-12 DIAGNOSIS — E876 Hypokalemia: Secondary | ICD-10-CM | POA: Insufficient documentation

## 2022-04-12 DIAGNOSIS — W01198A Fall on same level from slipping, tripping and stumbling with subsequent striking against other object, initial encounter: Secondary | ICD-10-CM | POA: Insufficient documentation

## 2022-04-12 DIAGNOSIS — S8011XA Contusion of right lower leg, initial encounter: Secondary | ICD-10-CM | POA: Insufficient documentation

## 2022-04-12 DIAGNOSIS — S8012XA Contusion of left lower leg, initial encounter: Secondary | ICD-10-CM | POA: Insufficient documentation

## 2022-04-12 DIAGNOSIS — I10 Essential (primary) hypertension: Secondary | ICD-10-CM | POA: Diagnosis not present

## 2022-04-12 DIAGNOSIS — E871 Hypo-osmolality and hyponatremia: Secondary | ICD-10-CM | POA: Diagnosis not present

## 2022-04-12 DIAGNOSIS — M545 Low back pain, unspecified: Secondary | ICD-10-CM | POA: Diagnosis not present

## 2022-04-12 DIAGNOSIS — S32010A Wedge compression fracture of first lumbar vertebra, initial encounter for closed fracture: Secondary | ICD-10-CM | POA: Diagnosis not present

## 2022-04-12 LAB — CBC
HCT: 38.7 % (ref 36.0–46.0)
Hemoglobin: 13.1 g/dL (ref 12.0–15.0)
MCH: 29 pg (ref 26.0–34.0)
MCHC: 33.9 g/dL (ref 30.0–36.0)
MCV: 85.6 fL (ref 80.0–100.0)
Platelets: 303 10*3/uL (ref 150–400)
RBC: 4.52 MIL/uL (ref 3.87–5.11)
RDW: 14.6 % (ref 11.5–15.5)
WBC: 9.3 10*3/uL (ref 4.0–10.5)
nRBC: 0 % (ref 0.0–0.2)

## 2022-04-12 LAB — URINALYSIS, ROUTINE W REFLEX MICROSCOPIC
Bacteria, UA: NONE SEEN
Bilirubin Urine: NEGATIVE
Glucose, UA: NEGATIVE mg/dL
Hgb urine dipstick: NEGATIVE
Ketones, ur: NEGATIVE mg/dL
Leukocytes,Ua: NEGATIVE
Nitrite: NEGATIVE
Protein, ur: 100 mg/dL — AB
Specific Gravity, Urine: 1.032 — ABNORMAL HIGH (ref 1.005–1.030)
pH: 6 (ref 5.0–8.0)

## 2022-04-12 LAB — MAGNESIUM: Magnesium: 1.9 mg/dL (ref 1.7–2.4)

## 2022-04-12 LAB — BASIC METABOLIC PANEL
Anion gap: 11 (ref 5–15)
BUN: 12 mg/dL (ref 8–23)
CO2: 29 mmol/L (ref 22–32)
Calcium: 9.6 mg/dL (ref 8.9–10.3)
Chloride: 86 mmol/L — ABNORMAL LOW (ref 98–111)
Creatinine, Ser: 0.56 mg/dL (ref 0.44–1.00)
GFR, Estimated: >60 mL/min (ref >60)
Glucose, Bld: 114 mg/dL — ABNORMAL HIGH (ref 70–99)
Potassium: 2.6 mmol/L — CL (ref 3.5–5.1)
Sodium: 126 mmol/L — ABNORMAL LOW (ref 135–145)

## 2022-04-12 MED ORDER — ACETAMINOPHEN 500 MG PO TABS
1000.0000 mg | ORAL_TABLET | Freq: Once | ORAL | Status: AC
  Administered 2022-04-12: 1000 mg via ORAL
  Filled 2022-04-12: qty 2

## 2022-04-12 MED ORDER — KETOROLAC TROMETHAMINE 30 MG/ML IJ SOLN
30.0000 mg | Freq: Once | INTRAMUSCULAR | Status: AC
  Administered 2022-04-12: 30 mg via INTRAMUSCULAR
  Filled 2022-04-12: qty 1

## 2022-04-12 MED ORDER — POTASSIUM CHLORIDE CRYS ER 20 MEQ PO TBCR
40.0000 meq | EXTENDED_RELEASE_TABLET | Freq: Once | ORAL | Status: AC
  Administered 2022-04-12: 40 meq via ORAL
  Filled 2022-04-12: qty 2

## 2022-04-12 NOTE — ED Notes (Signed)
ORTHO TECH CALLED FOR TLSO BRACE

## 2022-04-12 NOTE — ED Notes (Signed)
Pt states that she doesn't want to wait for the backbrace. Md haviland and [place] resident made aware. Pt ual with rollator to waiting room with friend. Pt underestands risks of back pain returning without brace, pt agreeable to plan.

## 2022-04-12 NOTE — ED Provider Notes (Addendum)
Allison Jimenez EMERGENCY DEPARTMENT AT [facility] Provider Note   CSN: [id number] Arrival date & time: 04/12/22  1037     History  Chief Complaint  Patient presents with   Back Pain    Allison Jimenez is a 72 y.o. female.   Back Pain Patient presents today with low back pain which she has a history of low back pain fractures.  Of note she is seen by [facility].  She was seen in her primary care clinic on Friday and given a Toradol injection.  She was also had a nerve ablation in the past by the spine and sc[facility] center.  Endorses falling on Sunday in which she lost balance while squatting and fell on her back.  She additionally hit the back of her head but denies loss of consciousness or any further head pain/bleeding.  She endorses urinary hesitancy, denies bowel or bladder incontinence and shooting pain down either leg.  Notes that her pain has been difficult control with pregabalin and the increase in her oxycodone which was advised by spine scoliosis center.  She further denies radiating pain up to shoulder or across abdomen.     Home Medications Prior to Admission medications   Medication Sig Start Date End Date Taking? Authorizing Provider  acetaminophen (TYLENOL) 500 MG tablet Take 500-1,000 mg by mouth every 6 (six) hours as needed for moderate pain.    [provider]  albuterol (VENTOLIN HFA) 108 (90 Base) MCG/ACT inhaler Inhale 2 puffs into the lungs every 6 (six) hours as needed for shortness of breath or wheezing. 07/08/18   [provider]  ALPRAZolam Prudy Feeler) 0.25 MG tablet TAKE 1 TABLET(0.25 MG) BY MOUTH THREE TIMES DAILY 03/22/22   Cox, Kirsten, MD  atorvastatin (LIPITOR) 20 MG tablet TAKE 1 TABLET(20 MG) BY MOUTH DAILY 02/05/22   Cox, Kirsten, MD  Carboxymethylcellul-Glycerin (LUBRICATING EYE DROPS OP) Place 1 drop into both eyes daily as needed (dry eyes).    [provider]  carvedilol (COREG) 25 MG tablet Take 1 tablet  (25 mg total) by mouth 2 (two) times daily with a meal. 04/09/22   Cox, Kirsten, MD  cetirizine (ZYRTEC) 10 MG tablet Take 10 mg by mouth daily as needed for allergies.    [provider]  chlorthalidone (HYGROTON) 25 MG tablet Take 25 mg by mouth daily.    [provider]  conjugated estrogens (PREMARIN) vaginal cream Place 1 applicator vaginally daily as needed (dryness / burning).    [provider]  cyclobenzaprine (FLEXERIL) 10 MG tablet Take 10 mg by mouth 3 (three) times daily as needed for muscle spasms. 11/10/19   [provider]  diclofenac Sodium (VOLTAREN) 1 % GEL diclofenac 1 % topical gel Patient taking differently: Apply 1 Application topically 4 (four) times daily as needed (pain). 06/15/19   Cox, Fritzi Mandes, MD  dicyclomine (BENTYL) 20 MG tablet Take 1 tablet (20 mg total) by mouth 2 (two) times daily as needed for spasms. 04/20/21   Cox, Kirsten, MD  Erenumab-aooe (AIMOVIG) 140 MG/ML SOAJ Inject 140 mg as directed every 30 (thirty) days.    [provider]  estradiol (ESTRACE) 0.1 MG/GM vaginal cream Place 1 Applicatorful vaginally at bedtime. Daily for 14 days then twice weekly 01/20/21   Cox, Fritzi Mandes, MD  famotidine (PEPCID) 40 MG tablet TAKE 1 TABLET(40 MG) BY MOUTH AT BEDTIME 03/07/22   Cox, Kirsten, MD  FLUoxetine (PROZAC) 20 MG capsule Take 3 capsules (60 mg total) by  mouth daily. 04/09/22   Cox, Fritzi Mandes, MD  fluticasone [facility]) 50 MCG/ACT nasal spray SHAKE LIQUID AND USE 2 SPRAYS IN [facility] NOSTRIL DAILY 11/26/21   Janie Morning, NP  HYDROcodone-acetaminophen (NORCO/VICODIN) 5-325 MG tablet Take 1 tablet by mouth 2 (two) times daily as needed for severe pain (OSteoarthritis of lumbar spine.). 11/02/21   CoxFritzi Mandes, MD  itraconazole (SPORANOX) 100 MG capsule Take 2 capsules (200 mg total) by mouth 2 (two) times daily. 02/07/22   Danelle Earthly, MD  ketoconazole (NIZORAL) 2 % cream Apply 1 application. topically daily. Patient taking  differently: Apply 1 application  topically daily as needed (cracked skin). 05/09/21   CoxFritzi Mandes, MD  linaclotide [facility]) 145 MCG CAPS capsule Take 1 capsule (145 mcg total) by mouth daily before breakfast. Patient taking differently: Take 145 mcg by mouth daily as needed (constipation). 05/09/21   Cox, Kirsten, MD  montelukast (SINGULAIR) 10 MG tablet TAKE 1 TABLET(10 MG) BY MOUTH AT BEDTIME 08/14/21   Cox, Kirsten, MD  Multiple Vitamins-Minerals (MULTIVITAMIN WITH IRON-MINERALS) liquid Take 15 mLs by mouth daily.    [provider]  ondansetron (ZOFRAN) 4 MG tablet TAKE 1 TABLET(4 MG) BY MOUTH EVERY 8 HOURS AS NEEDED FOR NAUSEA OR VOMITING 09/18/21   Cox, Kirsten, MD  pantoprazole (PROTONIX) 40 MG tablet TAKE 1 TABLET(40 MG) BY MOUTH DAILY 02/19/22   Cox, Kirsten, MD  potassium chloride (MICRO-K) 10 MEQ CR capsule Take 2 capsules (20 mEq total) by mouth 2 (two) times daily. 03/06/22   Cox, Fritzi Mandes, MD  promethazine (PHENERGAN) 25 MG tablet TAKE 1 TABLET BY MOUTH EVERY 8 HOURS IF NEEDED 04/26/20   Janie Morning, NP  rizatriptan (MAXALT) 10 MG tablet Take 1 tablet (10 mg total) by mouth as needed for migraine. May repeat in 2 hours if needed 04/30/17   Drema Dallas, DO  sennosides-docusate sodium (SENOKOT-S) 8.6-50 MG tablet Take 1 tablet by mouth daily. 03/27/22   CoxFritzi Mandes, MD  sulfamethoxazole-trimethoprim (BACTRIM DS) 800-160 MG tablet Take 1 tablet by mouth 2 (two) times daily. 04/04/22   Cox, Fritzi Mandes, MD  Tiotropium Bromide-Olodaterol (STIOLTO RESPIMAT) 2.5-2.5 MCG/ACT AERS INHALE 2 PUFFS INTO THE LUNGS DAILY 03/23/22   Cox, Kirsten, MD  vitamin B-12 (CYANOCOBALAMIN) 500 MCG tablet TAKE 1 TABLET BY MOUTH DAILY AT NOON 02/26/22   Blane Ohara, MD      Allergies    Erythromycin base, Vfend [voriconazole], and Penicillins    Review of Systems   Review of Systems  Musculoskeletal:  Positive for back pain.    Physical Exam Updated Vital Signs BP (!) 117/56   Pulse 79   Temp 97.9 F  (36.6 C) (Oral)   Resp 18   Ht 5' (1.524 m)   Wt 54.4 kg   SpO2 96%   BMI 23.44 kg/m  Physical Exam Vitals and nursing note reviewed.  Constitutional:      Appearance: Normal appearance.  HENT:     Head: Normocephalic and atraumatic.  Cardiovascular:     Rate and Rhythm: Normal rate and regular rhythm.     Heart sounds: Normal heart sounds.  Pulmonary:     Effort: Pulmonary effort is normal.     Breath sounds: Normal breath sounds.  Abdominal:     General: Bowel sounds are normal.     Palpations: Abdomen is soft.     Tenderness: There is no abdominal tenderness. There is no right CVA tenderness or left CVA tenderness.  Musculoskeletal:     Cervical  back: Normal range of motion. No rigidity or tenderness.     Right lower leg: No edema.     Left lower leg: No edema.  Skin:    Findings: Bruising present.     Comments: Bruising on lower legs, none appreciate on back  Neurological:     General: No focal deficit present.     Mental Status: She is alert and oriented to person, place, and time.    ED Results / Procedures / Treatments   Labs (all labs ordered are listed, but only abnormal results are displayed) Labs Reviewed  BASIC METABOLIC PANEL - Abnormal; Notable for the following components:      Result Value   Sodium 126 (*)    Potassium 2.6 (*)    Chloride 86 (*)    Glucose, Bld 114 (*)    All other components within normal limits  URINALYSIS, ROUTINE W REFLEX MICROSCOPIC - Abnormal; Notable for the following components:   APPearance HAZY (*)    Specific Gravity, Urine 1.032 (*)    Protein, ur 100 (*)    All other components within normal limits  CBC  MAGNESIUM    EKG None  Radiology DG Lumbar Spine Complete  Result Date: 04/12/2022 CLINICAL DATA:  Low back pain after fall. EXAM: LUMBAR SPINE - COMPLETE 4+ VIEW COMPARISON:  MRI of July 18, 2021. FINDINGS: There is interval development of moderate compression deformity of L1 vertebral body concerning for  fracture of indeterminate age. Spondylolisthesis is noted. Disc spaces are well-maintained. Calcified abdominal aortic aneurysm is again noted. IMPRESSION: Interval development of moderate compression deformity of L1 vertebral body concerning for fracture of indeterminate age. MRI may be performed for further evaluation. Calcified abdominal aortic aneurysm is again noted. Electronically Signed   By: Lupita RaiderJames  Green Jr M.D.   On: 04/12/2022 12:17    Procedures Procedures    Medications Ordered in ED Medications  acetaminophen (TYLENOL) tablet 1,000 mg (1,000 mg Oral Given 04/12/22 1201)  ketorolac (TORADOL) 30 MG/ML injection 30 mg (30 mg Intramuscular Given 04/12/22 1202)  potassium chloride SA (KLOR-CON M) CR tablet 40 mEq (40 mEq Oral Given 04/12/22 1305)    ED Course/ Medical Decision Making/ A&P                             Medical Decision Making Amount and/or Complexity of Data Reviewed Labs: ordered. Radiology: ordered.  Risk OTC drugs. Prescription drug management.  72 year old female with past medical history of diverticulosis, Mnire's disease, HTN, infrarenal abdominal aortic aneurysm, osteoporosis presenting with persistent low back pain secondary to fall due to loss of balance.  Lumbar XR revealed moderate compression deformity of L1 vertebral body concerning for fracture of indeterminate age.  UA does not show evidence of UTI.  BMP shows hyponatremia of 126 and hypokalemia of 2.6.  Provided potassium supplementation 40 mEq.  Advised holding chlorthalidone following up with PCP for osteoporosis discussion in addition to alternatives for chlorthalidone given it assists her Mnire's disease. Attempted to provide TLSO brace however patient opted to go home instead due [facility]  Final Clinical Impression(s) / ED Diagnoses Final diagnoses:  Compression fracture of L1 vertebra, initial encounter  Hypokalemia  Hyponatremia    Rx / DC  Orders ED Discharge Orders          Ordered    TLSO brace        04/12/22 1323  Shelby Mattocks, DO 04/12/22 1347    Shelby Mattocks, DO 04/12/22 1450    Jacalyn Lefevre, MD 04/17/22 1146

## 2022-04-12 NOTE — ED Triage Notes (Signed)
Pt c/o lower back pain. Pt has a hx of lower back fx. Pt had a "nerve burn" on 3/27 [facility]- Dr Bing Quarry. Pt was seen on Friday in clinic and given Toradol shot. Pt states Sunday afternoon, she lost balance squatting and fell on back. Pt states since then, pain has increased since.  Pt states she is having trouble urinating as well- hesitancy.  Pregablin and oxycodone have been easing the pain but not eliminating it.

## 2022-04-12 NOTE — Progress Notes (Signed)
Orthopedic Tech Progress Note Patient Details:  Allison Jimenez 01/28/1950 [id number]  Patient ID: Allison Jimenez, female   DOB: [birth year], 72 y.o.   MRN: [id number]  Allison Jimenez 04/12/2022, 1:31 PM Tlso brace ordered from [facility].

## 2022-04-12 NOTE — Discharge Instructions (Addendum)
May continue use your home medications for pain management.  Please stop taking your chlorthalidone.  Your provided a brace for this compression fracture.  Please follow-up with your PCP regarding your sodium and potassium in addition to osteoporosis conversation given you presented for compression fracture.

## 2022-04-12 NOTE — ED Notes (Signed)
Pt able to ambulate with rollator to bathrool steady gait. Pt educated on clean catch urine sample

## 2022-04-12 NOTE — ED Notes (Signed)
Opened chart when arrival of representative to apply back brace arrived. Pt elected to leave without brace.

## 2022-04-13 MED ORDER — ONDANSETRON HCL 4 MG PO TABS: 4.0000 mg | ORAL_TABLET | Freq: Three times a day (TID) | ORAL | 0 refills | Status: DC | PRN

## 2022-04-13 NOTE — Addendum Note (Signed)
Addended byDanelle Earthly on: 04/13/2022 09:54 AM   Modules accepted: Orders

## 2022-04-13 NOTE — Telephone Encounter (Signed)
Ill put in for zofran, taken 1/2 hour before. She has tolerated the itra for months in the past. Dose is itra 200 bid.

## 2022-04-16 ENCOUNTER — Other Ambulatory Visit: Payer: Self-pay

## 2022-04-16 ENCOUNTER — Telehealth: Payer: Self-pay | Admitting: Internal Medicine

## 2022-04-16 ENCOUNTER — Ambulatory Visit: Payer: Medicare PPO | Admitting: Internal Medicine

## 2022-04-16 ENCOUNTER — Telehealth (INDEPENDENT_AMBULATORY_CARE_PROVIDER_SITE_OTHER): Payer: Medicare PPO | Admitting: Internal Medicine

## 2022-04-16 DIAGNOSIS — B449 Aspergillosis, unspecified: Secondary | ICD-10-CM

## 2022-04-16 NOTE — Telephone Encounter (Signed)
Allison Jimenez called to reschedule her appt due to a recent back injury. I suggested a virtual visit but she stated her son is not around to help, she does not have access to her laptop, and she currently has a flip phone. Dr. Doristine Church nurse stated she needs an in person visit or a virtual visit and I explained this to her. She cancelled and will reschedule at a later date.  She wanted Dr. Thedore Mins to know she did not stop taking her medication but did reduce the amount from 4 to 2. With 4, she was having swelling in her right foot and trouble urinating. She no longer has those symptoms while only taking 2. Her doctor stated she has a compressed fracture in L1. She will have a MRI done tomorrow and will see the spine doctor on the 26th. She hopes Dr. Thedore Mins or a nurse can give her a call. She stated the appt was to discuss her medication and does not understand why this cannot be a telephone visit. She can be reached at [phone].

## 2022-04-16 NOTE — Telephone Encounter (Signed)
Called patient back regarding message. Patient has agreed to video visit today.  Juanita Laster, RMA

## 2022-04-16 NOTE — Progress Notes (Unsigned)
Subjective:    Patient ID: Allison Jimenez, female    DOB: 02-23-1950, 72 y.o.   MRN: [id number]  Chief Complaint  Patient presents with   Follow-up     Virtual Visit via Telephone/Video Note   I connected withNAME@ on 04/16/2022 at 2:35 PM by telephone and verified that I am speaking with the correct person using two identifiers.   I discussed the limitations, risks, security and privacy concerns of performing an evaluation and management service by telephone and the availability of in person appointments. I also discussed with the patient that there may be a patient responsible charge related to this service. The patient expressed understanding and agreed to proceed.  Location:  Patient: Home Provider: [facility]   HPI:  Allison Jimenez is a 72 y.o. female    Allergies  Allergen Reactions   Erythromycin Base Diarrhea   Vfend [Voriconazole]     Visual changes   Penicillins Rash      Outpatient Medications Prior to Visit  Medication Sig Dispense Refill   acetaminophen (TYLENOL) 500 MG tablet Take 500-1,000 mg by mouth every 6 (six) hours as needed for moderate pain.     albuterol (VENTOLIN HFA) 108 (90 Base) MCG/ACT inhaler Inhale 2 puffs into the lungs every 6 (six) hours as needed for shortness of breath or wheezing.     ALPRAZolam (XANAX) 0.25 MG tablet TAKE 1 TABLET(0.25 MG) BY MOUTH THREE TIMES DAILY 90 tablet 0   atorvastatin (LIPITOR) 20 MG tablet TAKE 1 TABLET(20 MG) BY MOUTH DAILY 90 tablet 1   Carboxymethylcellul-Glycerin (LUBRICATING EYE DROPS OP) Place 1 drop into both eyes daily as needed (dry eyes).     carvedilol (COREG) 25 MG tablet Take 1 tablet (25 mg total) by mouth 2 (two) times daily with a meal. 180 tablet 0   cetirizine (ZYRTEC) 10 MG tablet Take 10 mg by mouth daily as needed for allergies.     chlorthalidone (HYGROTON) 25 MG tablet Take 25 mg by mouth daily.     conjugated estrogens (PREMARIN) vaginal cream Place 1 applicator vaginally daily as  needed (dryness / burning).     cyclobenzaprine (FLEXERIL) 10 MG tablet Take 10 mg by mouth 3 (three) times daily as needed for muscle spasms.     diclofenac Sodium (VOLTAREN) 1 % GEL diclofenac 1 % topical gel (Patient taking differently: Apply 1 Application topically 4 (four) times daily as needed (pain).) 100 g 0   dicyclomine (BENTYL) 20 MG tablet Take 1 tablet (20 mg total) by mouth 2 (two) times daily as needed for spasms. 180 tablet 1   Erenumab-aooe (AIMOVIG) 140 MG/ML SOAJ Inject 140 mg as directed every 30 (thirty) days.     estradiol (ESTRACE) 0.1 MG/GM vaginal cream Place 1 Applicatorful vaginally at bedtime. Daily for 14 days then twice weekly 42.5 g 0   famotidine (PEPCID) 40 MG tablet TAKE 1 TABLET(40 MG) BY MOUTH AT BEDTIME 90 tablet 3   FLUoxetine (PROZAC) 20 MG capsule Take 3 capsules (60 mg total) by mouth daily. 270 capsule 0   fluticasone (FLONASE) 50 MCG/ACT nasal spray SHAKE LIQUID AND USE 2 SPRAYS IN EACH NOSTRIL DAILY 16 g 6   HYDROcodone-acetaminophen (NORCO/VICODIN) 5-325 MG tablet Take 1 tablet by mouth 2 (two) times daily as needed for severe pain (OSteoarthritis of lumbar spine.). 20 tablet 0   itraconazole (SPORANOX) 100 MG capsule Take 2 capsules (200 mg total) by mouth 2 (two) times daily. 120 capsule 3   ketoconazole (  NIZORAL) 2 % cream Apply 1 application. topically daily. (Patient taking differently: Apply 1 application  topically daily as needed (cracked skin).) 15 g 0   linaclotide (LINZESS) 145 MCG CAPS capsule Take 1 capsule (145 mcg total) by mouth daily before breakfast. (Patient taking differently: Take 145 mcg by mouth daily as needed (constipation).) 8 capsule 0   montelukast (SINGULAIR) 10 MG tablet TAKE 1 TABLET(10 MG) BY MOUTH AT BEDTIME 90 tablet 2   Multiple Vitamins-Minerals (MULTIVITAMIN WITH IRON-MINERALS) liquid Take 15 mLs by mouth daily.     ondansetron (ZOFRAN) 4 MG tablet Take 1 tablet (4 mg total) by mouth every 8 (eight) hours as needed for  nausea or vomiting. 30 tablet 0   pantoprazole (PROTONIX) 40 MG tablet TAKE 1 TABLET(40 MG) BY MOUTH DAILY 90 tablet 0   potassium chloride (MICRO-K) 10 MEQ CR capsule Take 2 capsules (20 mEq total) by mouth 2 (two) times daily. 360 capsule 1   promethazine (PHENERGAN) 25 MG tablet TAKE 1 TABLET BY MOUTH EVERY 8 HOURS IF NEEDED 20 tablet 0   rizatriptan (MAXALT) 10 MG tablet Take 1 tablet (10 mg total) by mouth as needed for migraine. May repeat in 2 hours if needed 10 tablet 0   sennosides-docusate sodium (SENOKOT-S) 8.6-50 MG tablet Take 1 tablet by mouth daily. 30 tablet 3   sulfamethoxazole-trimethoprim (BACTRIM DS) 800-160 MG tablet Take 1 tablet by mouth 2 (two) times daily. 14 tablet 0   Tiotropium Bromide-Olodaterol (STIOLTO RESPIMAT) 2.5-2.5 MCG/ACT AERS INHALE 2 PUFFS INTO THE LUNGS DAILY 4 g 2   vitamin B-12 (CYANOCOBALAMIN) 500 MCG tablet TAKE 1 TABLET BY MOUTH DAILY AT NOON 90 tablet 1   No facility-administered medications prior to visit.     Past Medical History:  Diagnosis Date   Acute blood loss anemia (ABLA) 09/05/2020   Allergy    ANXIETY 06/05/2006   Arthritis    SHOULDERS    BACK PAIN 01/05/2010   Cataract    bilateral, left worse   Centrilobular emphysema    COPD (chronic obstructive pulmonary disease)    Depression    Diabetes mellitus without complication    no meds, diet controllled   DIVERTICULOSIS, COLON 06/05/2006   DIZZINESS OR VERTIGO 06/05/2006   positional   Hearing loss in left ear    no hearing aid   History of blood transfusion 08/2020   History of kidney stones    passed stones   HYPERLIPIDEMIA 06/05/2006   Hypertension    HYPOKALEMIA 12/14/2008   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   Qualifier: Diagnosis of  By: Amador Cunas  MD, Janett Labella    Migraine    TOBACCO USER 11/25/2007     Past Surgical History:  Procedure Laterality Date   APPENDECTOMY     BRONCHIAL BIOPSY  09/28/2021   Procedure: BRONCHIAL BIOPSIES;  Surgeon: Omar Person,  MD;  Location[facility] ENDOSCOPY;  Service: Pulmonary;;   BRONCHIAL NEEDLE ASPIRATION BIOPSY  09/28/2021   Procedure: BRONCHIAL NEEDLE ASPIRATION BIOPSIES;  Surgeon: Omar Person, MD;  Location: [facility] ENDOSCOPY;  Service: Pulmonary;;   BRONCHIAL WASHINGS  09/28/2021   Procedure: BRONCHIAL WASHINGS;  Surgeon: Omar Person, MD;  Location: [facility] ENDOSCOPY;  Service: Pulmonary;;   CESAREAN SECTION     x1   CHOLECYSTECTOMY     COLONOSCOPY     COSMETIC SURGERY     mastoid surgery  1992   shunt in mastoid- endolymphatic sac in left ear    MRI  07/11/2020   x  several, last one located in CE   TUBAL LIGATION     UPPER GI ENDOSCOPY     vocal cord surgery     nodule x2       Review of Systems    Objective:    Nursing note and vital signs reviewed.     Assessment & Plan:   Problem List Items Addressed This Visit   None    I am having Larry Sierras. Luisa Hart maintain her rizatriptan, albuterol, Aimovig, diclofenac Sodium, cyclobenzaprine, promethazine, estradiol, dicyclomine, ketoconazole, linaclotide, montelukast, chlorthalidone, conjugated estrogens, acetaminophen, multivitamin with iron-minerals, cetirizine, Carboxymethylcellul-Glycerin (LUBRICATING EYE DROPS OP), HYDROcodone-acetaminophen, fluticasone, atorvastatin, itraconazole, pantoprazole, vitamin B-12, potassium chloride, famotidine, ALPRAZolam, Stiolto Respimat, sennosides-docusate sodium, sulfamethoxazole-trimethoprim, FLUoxetine, carvedilol, and ondansetron.   No orders of the defined types were placed in this encounter.    I discussed the assessment and treatment plan with the patient. The patient was provided an opportunity to ask questions and all were answered. The patient agreed with the plan and demonstrated an understanding of the instructions.   The patient was advised to call back or seek an in-person evaluation if the symptoms worsen or if the condition fails to improve as anticipated.   I provided   minutes of  non-face-to-face time during this encounter.  Follow-up:=]

## 2022-04-16 NOTE — Telephone Encounter (Signed)
Patient seen by Dr. Thedore Mins today

## 2022-04-17 DIAGNOSIS — S32010A Wedge compression fracture of first lumbar vertebra, initial encounter for closed fracture: Secondary | ICD-10-CM | POA: Diagnosis not present

## 2022-04-17 DIAGNOSIS — M47816 Spondylosis without myelopathy or radiculopathy, lumbar region: Secondary | ICD-10-CM | POA: Diagnosis not present

## 2022-04-17 DIAGNOSIS — X58XXXA Exposure to other specified factors, initial encounter: Secondary | ICD-10-CM | POA: Diagnosis not present

## 2022-04-17 DIAGNOSIS — W19XXXA Unspecified fall, initial encounter: Secondary | ICD-10-CM | POA: Diagnosis not present

## 2022-04-17 DIAGNOSIS — M545 Low back pain, unspecified: Secondary | ICD-10-CM | POA: Diagnosis not present

## 2022-04-17 DIAGNOSIS — M8008XA Age-related osteoporosis with current pathological fracture, vertebra(e), initial encounter for fracture: Secondary | ICD-10-CM | POA: Diagnosis not present

## 2022-04-18 ENCOUNTER — Telehealth: Payer: Self-pay

## 2022-04-18 ENCOUNTER — Telehealth: Payer: Self-pay | Admitting: *Deleted

## 2022-04-18 NOTE — Telephone Encounter (Signed)
Patient called and stated that she has finished the bactrim that we put her on and now she is still blowing out green and yellow snot and asking if you can send in another antibiotic? Please advise.

## 2022-04-18 NOTE — Telephone Encounter (Signed)
Patient informed. 

## 2022-04-18 NOTE — Telephone Encounter (Signed)
     Patient  visit on 04/12/2022  at [place]  was for transportation   Have you been able to follow up with your primary care physician? Back worse had MRI yesterday and going to spine center friday has trasnportation and also needed medicines  The patient was able to obtain any needed medicine or equipment.  Are there diet recommendations that you are having difficulty following?  Patient expresses understanding of discharge instructions and education provided has no other needs at this time.  yes  Allison Jimenez -[name] [place], Population Health [phone] [address] [place] , [place] [address] Email : [name]

## 2022-04-24 ENCOUNTER — Other Ambulatory Visit: Payer: Self-pay | Admitting: Student

## 2022-04-24 ENCOUNTER — Other Ambulatory Visit: Payer: Self-pay

## 2022-04-24 DIAGNOSIS — J441 Chronic obstructive pulmonary disease with (acute) exacerbation: Secondary | ICD-10-CM

## 2022-04-25 ENCOUNTER — Other Ambulatory Visit: Payer: Self-pay | Admitting: Family Medicine

## 2022-04-25 ENCOUNTER — Telehealth: Payer: Self-pay

## 2022-04-25 MED ORDER — TRELEGY ELLIPTA 100-62.5-25 MCG/ACT IN AEPB: 1.0000 | INHALATION_SPRAY | Freq: Every day | RESPIRATORY_TRACT | 3 refills | Status: DC

## 2022-04-25 NOTE — Telephone Encounter (Signed)
Patient Made Aware, Verbalized Understanding. 

## 2022-04-25 NOTE — Telephone Encounter (Signed)
Patient called stating that the Stiolto inhaler has not been helping her at all, and she states she was benefiting more from the Trelegy and prefers to go back to it if she can. She is wanting to know if you can send in the Trelegy inhaler and switch her back to that. Please advise.

## 2022-04-27 ENCOUNTER — Other Ambulatory Visit: Payer: Medicare PPO

## 2022-05-02 ENCOUNTER — Ambulatory Visit: Payer: Medicare PPO | Admitting: Family Medicine

## 2022-05-03 DIAGNOSIS — M8008XA Age-related osteoporosis with current pathological fracture, vertebra(e), initial encounter for fracture: Secondary | ICD-10-CM | POA: Diagnosis not present

## 2022-05-03 DIAGNOSIS — S32010A Wedge compression fracture of first lumbar vertebra, initial encounter for closed fracture: Secondary | ICD-10-CM | POA: Diagnosis not present

## 2022-05-07 DIAGNOSIS — S22080A Wedge compression fracture of T11-T12 vertebra, initial encounter for closed fracture: Secondary | ICD-10-CM | POA: Diagnosis not present

## 2022-05-08 ENCOUNTER — Other Ambulatory Visit: Payer: Self-pay | Admitting: *Deleted

## 2022-05-08 ENCOUNTER — Telehealth: Payer: Self-pay

## 2022-05-08 DIAGNOSIS — S22080A Wedge compression fracture of T11-T12 vertebra, initial encounter for closed fracture: Secondary | ICD-10-CM

## 2022-05-08 NOTE — Telephone Encounter (Signed)
Patient called staring that she needs a rx for tx of osteoporosis. States she has had 2 new fractures in the last 30 days.

## 2022-05-10 ENCOUNTER — Other Ambulatory Visit: Payer: Self-pay | Admitting: Family Medicine

## 2022-05-10 ENCOUNTER — Other Ambulatory Visit: Payer: Self-pay

## 2022-05-10 DIAGNOSIS — M8000XA Age-related osteoporosis with current pathological fracture, unspecified site, initial encounter for fracture: Secondary | ICD-10-CM

## 2022-05-14 ENCOUNTER — Telehealth: Payer: Self-pay | Admitting: Internal Medicine

## 2022-05-14 DIAGNOSIS — B449 Aspergillosis, unspecified: Secondary | ICD-10-CM | POA: Diagnosis not present

## 2022-05-14 NOTE — Telephone Encounter (Signed)
Labs entered, to be done 12 hours after last itraconazole dose

## 2022-05-14 NOTE — Addendum Note (Signed)
Addended by: Marcell Anger on: 05/14/2022 11:10 AM   Modules accepted: Orders

## 2022-05-15 ENCOUNTER — Ambulatory Visit
Admission: RE | Admit: 2022-05-15 | Discharge: 2022-05-15 | Disposition: A | Discharge status: Home / Self Care | Payer: Medicare PPO | Source: Ambulatory Visit | Attending: Student | Admitting: Student

## 2022-05-15 DIAGNOSIS — R918 Other nonspecific abnormal finding of lung field: Secondary | ICD-10-CM

## 2022-05-15 DIAGNOSIS — R911 Solitary pulmonary nodule: Secondary | ICD-10-CM | POA: Diagnosis not present

## 2022-05-15 DIAGNOSIS — I10 Essential (primary) hypertension: Secondary | ICD-10-CM | POA: Diagnosis not present

## 2022-05-15 DIAGNOSIS — I7143 Infrarenal abdominal aortic aneurysm, without rupture: Secondary | ICD-10-CM | POA: Diagnosis not present

## 2022-05-15 LAB — LAB REPORT - SCANNED: EGFR: 99

## 2022-05-16 ENCOUNTER — Telehealth: Payer: Self-pay | Admitting: Pharmacist

## 2022-05-16 DIAGNOSIS — M19011 Primary osteoarthritis, right shoulder: Secondary | ICD-10-CM | POA: Diagnosis not present

## 2022-05-16 NOTE — Telephone Encounter (Signed)
Itraconazole level from 5/13 came back at 1.4 ug/mL.   Anushka Hartinger L. Tymeka Privette, PharmD [facility] Clinical Pharmacist Practitioner

## 2022-05-18 ENCOUNTER — Telehealth: Payer: Self-pay

## 2022-05-18 NOTE — Telephone Encounter (Signed)
Patient called requesting lab results that were done on Monday, she is specifically interested in potassium and sodium. Relayed that both were within normal range.   Labs available via [facility], routing to provider for review.   Sandie Ano, RN

## 2022-05-19 ENCOUNTER — Other Ambulatory Visit: Payer: Self-pay | Admitting: Family Medicine

## 2022-05-19 DIAGNOSIS — J302 Other seasonal allergic rhinitis: Secondary | ICD-10-CM

## 2022-05-21 ENCOUNTER — Other Ambulatory Visit: Payer: Self-pay | Admitting: Family Medicine

## 2022-05-21 NOTE — Progress Notes (Unsigned)
Synopsis: Referred for COPD exacerbation by Blane Ohara, MD  Subjective:   PATIENT ID: Allison Jimenez: female DOB: [birth year], MRN: [id number]  No chief complaint on file.  72yF with history of COPD/emphysema, 22 py smoking including ecigs, meniere's, referred by Flonnie Hailstone following AECOPD 11/01/20.  She has been prescribed trelegy - but she is not using it. Doesn't feel like she needs it.   Some DOE when she goes up long incline. She doesn't necessarily think it's worse than other patients her age without lung disease. She does have cough but not especially bothersome.   She says she thinks she has allergic rhinitis   She is currently enrolled in our lung cancer screening program. Vapes with some sort of ecig but unsure what is in liquid - she doesn't think it contains nicotine.   Interval HPI Continued on trelegy, started flonase, LDCT Chest with several concerning nodules  Switched to anoro by PCP for throat irritation, just started on Monday. She has also been started on diflucan. She has some cough - intermittently productive of whitish sputum. No fever, night sweats, weight loss.  --------------------------------------------- Visual symptoms with vori, stopped. Started on itraconazole 01/31/22 with Dr. Thedore Mins.   She did have cough productive of whitish sputum and lots of PND before starting itra. Cough seems to be a little drier   No change in DOE.  ---------------------------------- Stable nodules on surveillance imaging 05/15/22  Otherwise pertinent review of systems is negative.  No family history of lung cancer.   Past Medical History:  Diagnosis Date   Acute blood loss anemia (ABLA) 09/05/2020   Allergy    ANXIETY 06/05/2006   Arthritis    SHOULDERS    BACK PAIN 01/05/2010   Cataract    bilateral, left worse   Centrilobular emphysema (HCC)    COPD (chronic obstructive pulmonary disease) (HCC)    Depression    Diabetes mellitus without complication  (HCC)    no meds, diet controllled   DIVERTICULOSIS, COLON 06/05/2006   DIZZINESS OR VERTIGO 06/05/2006   positional   Hearing loss in left ear    no hearing aid   History of blood transfusion 08/2020   History of kidney stones    passed stones   HYPERLIPIDEMIA 06/05/2006   Hypertension    HYPOKALEMIA 12/14/2008   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   Qualifier: Diagnosis of  By: Amador Cunas  MD, Janett Labella    Migraine    TOBACCO USER 11/25/2007     Family History  Problem Relation Age of Onset   Breast cancer Mother    Migraines Other    Depression Other    Anxiety disorder Other    High Cholesterol Other    Colon cancer Neg Hx    Rectal cancer Neg Hx    Stomach cancer Neg Hx    Colon polyps Neg Hx    Esophageal cancer Neg Hx      Past Surgical History:  Procedure Laterality Date   APPENDECTOMY     BRONCHIAL BIOPSY  09/28/2021   Procedure: BRONCHIAL BIOPSIES;  Surgeon: Omar Person, MD;  Location: [facility] ENDOSCOPY;  Service: Pulmonary;;   BRONCHIAL NEEDLE ASPIRATION BIOPSY  09/28/2021   Procedure: BRONCHIAL NEEDLE ASPIRATION BIOPSIES;  Surgeon: Omar Person, MD;  Location: [facility] ENDOSCOPY;  Service: Pulmonary;;   BRONCHIAL WASHINGS  09/28/2021   Procedure: BRONCHIAL WASHINGS;  Surgeon: Omar Person, MD;  Location: [facility] ENDOSCOPY;  Service: Pulmonary;;   CESAREAN SECTION     x1  CHOLECYSTECTOMY     COLONOSCOPY     COSMETIC SURGERY     mastoid surgery  1992   shunt in mastoid- endolymphatic sac in left ear    MRI  07/11/2020   x several, last one located in CE   TUBAL LIGATION     UPPER GI ENDOSCOPY     vocal cord surgery     nodule x2    Social History   Socioeconomic History   Marital status: Divorced    Spouse name: Not on file   Number of children: 1   Years of education: 13   Highest education level: Not on file  Occupational History    Employer: [employer]    Comment: Retired  Tobacco Use   Smoking status: Every Day    Packs/day:  0.50    Years: 49.00    Additional pack years: 0.00    Total pack years: 24.50    Types: Cigarettes   Smokeless tobacco: Never   Tobacco comments:    3/4 ppd 08/30/21 Vernie Murders, CMA  Vaping Use   Vaping Use: Former   Start date: 01/01/2010   Quit date: 08/01/2021   Substances: Nicotine   Devices: menthol/fruit flavor  Substance and Sexual Activity   Alcohol use: No    Alcohol/week: 0.0 standard drinks of alcohol   Drug use: No   Sexual activity: Not Currently    Birth control/protection: Post-menopausal  Other Topics Concern   Not on file  Social History Narrative   Patient lives at home alone. Patient has a Biochemist, clinical. Patient is divorced .   Patient is retired.   Education high school.   Right handed.   Caffeine none.   Social Determinants of Health   Financial Resource Strain: Not on file  Food Insecurity: Not on file  Transportation Needs: Not on file  Physical Activity: Not on file  Stress: Not on file  Social Connections: Not on file  Intimate Partner Violence: Not on file     Allergies  Allergen Reactions   Erythromycin Base Diarrhea   Vfend [Voriconazole]     Visual changes   Penicillins Rash     Outpatient Medications Prior to Visit  Medication Sig Dispense Refill   acetaminophen (TYLENOL) 500 MG tablet Take 500-1,000 mg by mouth every 6 (six) hours as needed for moderate pain.     albuterol (VENTOLIN HFA) 108 (90 Base) MCG/ACT inhaler Inhale 2 puffs into the lungs every 6 (six) hours as needed for shortness of breath or wheezing.     ALPRAZolam (XANAX) 0.25 MG tablet TAKE 1 TABLET(0.25 MG) BY MOUTH THREE TIMES DAILY 90 tablet 0   atorvastatin (LIPITOR) 20 MG tablet TAKE 1 TABLET(20 MG) BY MOUTH DAILY 90 tablet 1   Carboxymethylcellul-Glycerin (LUBRICATING EYE DROPS OP) Place 1 drop into both eyes daily as needed (dry eyes).     carvedilol (COREG) 25 MG tablet Take 1 tablet (25 mg total) by mouth 2 (two) times daily with a meal. 180 tablet 0    cetirizine (ZYRTEC) 10 MG tablet Take 10 mg by mouth daily as needed for allergies.     chlorthalidone (HYGROTON) 25 MG tablet Take 25 mg by mouth daily.     conjugated estrogens (PREMARIN) vaginal cream Place 1 applicator vaginally daily as needed (dryness / burning).     cyclobenzaprine (FLEXERIL) 10 MG tablet Take 10 mg by mouth 3 (three) times daily as needed for muscle spasms.     diclofenac Sodium (  VOLTAREN) 1 % GEL diclofenac 1 % topical gel (Patient taking differently: Apply 1 Application topically 4 (four) times daily as needed (pain).) 100 g 0   dicyclomine (BENTYL) 20 MG tablet Take 1 tablet (20 mg total) by mouth 2 (two) times daily as needed for spasms. 180 tablet 1   Erenumab-aooe (AIMOVIG) 140 MG/ML SOAJ Inject 140 mg as directed every 30 (thirty) days.     estradiol (ESTRACE) 0.1 MG/GM vaginal cream Place 1 Applicatorful vaginally at bedtime. Daily for 14 days then twice weekly 42.5 g 0   famotidine (PEPCID) 40 MG tablet TAKE 1 TABLET(40 MG) BY MOUTH AT BEDTIME 90 tablet 3   FLUoxetine (PROZAC) 20 MG capsule Take 3 capsules (60 mg total) by mouth daily. 270 capsule 0   fluticasone (FLONASE) 50 MCG/ACT nasal spray SHAKE LIQUID AND USE 2 SPRAYS IN EACH NOSTRIL DAILY 16 g 6   Fluticasone-Umeclidin-Vilant (TRELEGY ELLIPTA) 100-62.5-25 MCG/ACT AEPB Inhale 1 puff into the lungs daily. 3 each 3   itraconazole (SPORANOX) 100 MG capsule Take 2 capsules (200 mg total) by mouth 2 (two) times daily. 120 capsule 3   ketoconazole (NIZORAL) 2 % cream Apply 1 application. topically daily. (Patient taking differently: Apply 1 application  topically daily as needed (cracked skin).) 15 g 0   linaclotide (LINZESS) 145 MCG CAPS capsule Take 1 capsule (145 mcg total) by mouth daily before breakfast. (Patient taking differently: Take 145 mcg by mouth daily as needed (constipation).) 8 capsule 0   montelukast (SINGULAIR) 10 MG tablet TAKE 1 TABLET(10 MG) BY MOUTH AT BEDTIME 90 tablet 0   Multiple  Vitamins-Minerals (MULTIVITAMIN WITH IRON-MINERALS) liquid Take 15 mLs by mouth daily.     oxyCODONE (OXY IR/ROXICODONE) 5 MG immediate release tablet Take 5 mg by mouth every 6 (six) hours as needed.     pantoprazole (PROTONIX) 40 MG tablet TAKE 1 TABLET(40 MG) BY MOUTH DAILY 90 tablet 0   potassium chloride (MICRO-K) 10 MEQ CR capsule Take 2 capsules (20 mEq total) by mouth 2 (two) times daily. 360 capsule 1   promethazine (PHENERGAN) 25 MG tablet TAKE 1 TABLET BY MOUTH EVERY 8 HOURS IF NEEDED 20 tablet 0   rizatriptan (MAXALT) 10 MG tablet Take 1 tablet (10 mg total) by mouth as needed for migraine. May repeat in 2 hours if needed 10 tablet 0   sennosides-docusate sodium (SENOKOT-S) 8.6-50 MG tablet Take 1 tablet by mouth daily. 30 tablet 3   sulfamethoxazole-trimethoprim (BACTRIM DS) 800-160 MG tablet Take 1 tablet by mouth 2 (two) times daily. 14 tablet 0   vitamin B-12 (CYANOCOBALAMIN) 500 MCG tablet TAKE 1 TABLET BY MOUTH DAILY AT NOON 90 tablet 1   No facility-administered medications prior to visit.       Objective:   Physical Exam:  General appearance: 72 y.o., female, NAD, conversant  Eyes: anicteric sclerae; PERRL, tracking appropriately HENT: NCAT; MMM Neck: Trachea midline; no lymphadenopathy, no JVD Lungs: inspiratory wheeze on left >R, no crackles, with normal respiratory effort CV: RRR, no murmur  Abdomen: Soft, non-tender; non-distended, BS present  Extremities: No peripheral edema, warm Skin: Normal turgor and texture; no rash Psych: Appropriate affect Neuro: Alert and oriented to person and place, no focal deficit     There were no vitals filed for this visit.        on RA BMI Readings from Last 3 Encounters:  04/12/22 23.44 kg/m  04/09/22 23.44 kg/m  03/27/22 21.87 kg/m   Wt Readings from Last 3 Encounters:  04/12/22 120 lb (54.4 kg)  04/09/22 120 lb (54.4 kg)  03/27/22 112 lb (50.8 kg)     CBC    Component Value Date/Time   WBC 9.3  04/12/2022 1117   RBC 4.52 04/12/2022 1117   HGB 13.1 04/12/2022 1117   HGB 13.4 03/27/2022 1211   HCT 38.7 04/12/2022 1117   HCT 39.7 03/27/2022 1211   PLT 303 04/12/2022 1117   PLT 298 03/27/2022 1211   MCV 85.6 04/12/2022 1117   MCV 86 03/27/2022 1211   MCH 29.0 04/12/2022 1117   MCHC 33.9 04/12/2022 1117   RDW 14.6 04/12/2022 1117   RDW 14.2 03/27/2022 1211   LYMPHSABS 2.5 03/27/2022 1211   MONOABS 0.7 07/14/2020 1145   EOSABS 0.1 03/27/2022 1211   BASOSABS 0.0 03/27/2022 1211    Eos 100-300  Chest Imaging: CT 05/06/20 reviewed by me with emphysema, small <35mm nodules  CT 05/2021 reviewed by me new nodular focus of likely scar 1.25mm apiral RUL, new solid RML 4.57mm nodule  LDCT Chest 08/25/21 reviewed by me with multiple new concerning appearing nodules, substantially decreased size of RUL nodule.  CT Chest 12/2021 reviewed by me with no interval growth of nodules, 2 new small nodules LUL, LLL  Pulmonary Functions Testing Results:    Latest Ref Rng & Units 03/21/2021   12:57 PM  PFT Results  FVC-Pre L 2.47   FVC-Predicted Pre % 94   FVC-Post L 2.56   FVC-Predicted Post % 97   Pre FEV1/FVC % % 61   Post FEV1/FCV % % 60   FEV1-Pre L 1.52   FEV1-Predicted Pre % 76   FEV1-Post L 1.55   DLCO uncorrected ml/min/mmHg 10.29   DLCO UNC% % 59   DLCO corrected ml/min/mmHg 10.29   DLCO COR %Predicted % 59   DLVA Predicted % 56   TLC L 6.32   TLC % Predicted % 139   RV % Predicted % 197   Reviewed by me with Mild obstruction, hyperinflation, air trapping, moderately reduced diffusing capacity  Echocardiogram:   TTE 2021 impaired relaxation     Assessment & Plan:   # COPD gold functional group B: # Emphysema  # Rhinitis # Postnasal drainage Following with allergy  # Smoking:  # Pulmonary nodules # Chronic cavitary aspergillosis  Plan: - continue stiolto 2 puffs once daily, albuterol as needed. Bring inhalers to next appointment - CT Chest in April after  course of itraconazole and clinic visit after - look up chantix if you're interested let me know and I'll send in prescription to help stop smoking. Alternatively let me know if you'd like to try combination of patches and gum    Omar Person, MD [facility] 05/21/2022 6:29 PM

## 2022-05-22 ENCOUNTER — Other Ambulatory Visit: Payer: Self-pay

## 2022-05-22 ENCOUNTER — Ambulatory Visit
Admission: RE | Admit: 2022-05-22 | Discharge: 2022-05-22 | Disposition: A | Discharge status: Home / Self Care | Payer: Medicare PPO | Source: Ambulatory Visit | Attending: *Deleted | Admitting: *Deleted

## 2022-05-22 ENCOUNTER — Other Ambulatory Visit: Payer: Self-pay | Admitting: Family Medicine

## 2022-05-22 ENCOUNTER — Ambulatory Visit: Payer: Medicare PPO | Admitting: Internal Medicine

## 2022-05-22 VITALS — BP 155/80 | HR 75 | Temp 97.8°F

## 2022-05-22 DIAGNOSIS — Z5181 Encounter for therapeutic drug level monitoring: Secondary | ICD-10-CM

## 2022-05-22 DIAGNOSIS — S22080A Wedge compression fracture of T11-T12 vertebra, initial encounter for closed fracture: Secondary | ICD-10-CM | POA: Diagnosis not present

## 2022-05-22 NOTE — Progress Notes (Signed)
Patient: Allison Jimenez  DOB: 24-May-1950 MRN: [id number] PCP: Blane Ohara, MD     Patient Active Problem List   Diagnosis Date Noted   Osteopenia of lumbar spine 04/09/2022   GAD (generalized anxiety disorder) 04/09/2022   Bruising 04/01/2022   Dysuria 04/01/2022   Vitamin D insufficiency 01/25/2022   Visit for screening mammogram 01/25/2022   Hoarseness 10/28/2021   Pathological fracture due to age-related osteoporosis with routine healing 10/23/2021   Pulmonary nodules    Abnormal TSH 09/01/2021   Other constipation 09/01/2021   Hyponatremia 07/17/2021   B12 deficiency 07/17/2021   Flushes 05/28/2021   COPD (chronic obstructive pulmonary disease) (HCC) 01/19/2021   Aneurysm of infrarenal abdominal aorta (HCC) 09/05/2020   Hypothyroidism (acquired) 07/10/2020   Hypertension complicating diabetes (HCC) 07/10/2020   Ocular migraine with status migrainosus 07/10/2020   Essential hypertension 06/15/2019   Acute infection of nasal sinus 05/12/2019   Major depressive disorder, recurrent episode, mild (HCC) 09/10/2018   Mixed conductive and sensorineural hearing loss of left ear with restricted hearing of right ear 01/24/2017   Meniere's disease in remission, bilateral 01/24/2017   Lumbar back pain 01/05/2010   TOBACCO USER 11/25/2007   Dyslipidemia 06/05/2006   Diverticulosis of colon 06/05/2006   Dizziness 06/05/2006     Subjective:  Allison Jimenez is a 72 y.o.  female with past medical history, including tobacco abuse, COPD,  as below presents for management of pulmonary aspergillosis.  She had a PET/CT done on 9/12 showing bilateral pulmonary nodule with variable FDG accumulation up to low-level hypermetabolism.  Overall appearance is generally different than on the 08/25/2020 exam.  Noted upper lobe predominance favors atypical infection.  She underwent bronchoscopy with Dr. Thora Lance on 09/28/21, bacterial(NG), fungal(NG, stain -), AFB cultures(smear -, NG), with path  obtained.  Mulitple nodes biopsied with path of right upper lobe via needle biopsy showed fungal organisms present consistent with Aspergillus.  Imaging progression: Low-dose CT on 05/06/2020 was benign.  Repeat CT 12 months LD CT on 05/12/21 showed new irregular 14.4 mm focus of consolidation right upper lobe, new 4.2 mm solid right middle lobe pulmonary nodule.  Recommended low-dose CT without contrast in 3 months. Low-dose screening CT on 8/25 whosed regression of the previously noted lesion of concern near the apex of the right upper lobe, but multiple new lesions scattered throughout the lungs bilaterally which are highly aggressive in appearance PET on 09/11/21 showed 10 mm left upper lobe nodule , Index cavitary nodule right upper lobe measuring 10 mm , several tiny nodules throughout.     10/16/21: patient just denies worsening cough, shortness of breath, hemoptysis.  She denies fevers and chills.  She reports she has been vaping but quit about 2 months ago.  She has been smoking for about 40 years.  Due to some life circumstances she had been smoking up to a pack a day this year but now has cut down to about 12 cigarettes a day.She reports using a walker for back pain.    01/08/21: Pt is on room air. No new complaints.    01/31/22: stopped vori due to side effect. Pt reprotstwo hours after first dose, she same lights blinking in the dark. She had flashes of light for caouple of houts.  Same thing after the nest two doses and antifungalstopped.      03/14/22: No new complaints. Tolerating itraconazole   04/16/22: video visit. She reports that she reduced itra dose to 100 bid because  she noted right foot swelling who numbness. She states she had a recent fracture in her back and has MRI pending. Denies fever, chills, worsening resp symptoms. No acute complaints today   Today  05/22/22: Doing well, no new complaints. Tolerating itraconazole. Review of Systems  All other systems reviewed and are  negative.   Past Medical History:  Diagnosis Date   Acute blood loss anemia (ABLA) 09/05/2020   Allergy    ANXIETY 06/05/2006   Arthritis    SHOULDERS    BACK PAIN 01/05/2010   Cataract    bilateral, left worse   Centrilobular emphysema (HCC)    COPD (chronic obstructive pulmonary disease) (HCC)    Depression    Diabetes mellitus without complication (HCC)    no meds, diet controllled   DIVERTICULOSIS, COLON 06/05/2006   DIZZINESS OR VERTIGO 06/05/2006   positional   Hearing loss in left ear    no hearing aid   History of blood transfusion 08/2020   History of kidney stones    passed stones   HYPERLIPIDEMIA 06/05/2006   Hypertension    HYPOKALEMIA 12/14/2008   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   Qualifier: Diagnosis of  By: Amador Cunas  MD, Janett Labella    Migraine    TOBACCO USER 11/25/2007    Outpatient Medications Prior to Visit  Medication Sig Dispense Refill   acetaminophen (TYLENOL) 500 MG tablet Take 500-1,000 mg by mouth every 6 (six) hours as needed for moderate pain.     albuterol (VENTOLIN HFA) 108 (90 Base) MCG/ACT inhaler Inhale 2 puffs into the lungs every 6 (six) hours as needed for shortness of breath or wheezing.     ALPRAZolam (XANAX) 0.25 MG tablet TAKE 1 TABLET(0.25 MG) BY MOUTH THREE TIMES DAILY 90 tablet 0   atorvastatin (LIPITOR) 20 MG tablet TAKE 1 TABLET(20 MG) BY MOUTH DAILY 90 tablet 1   Carboxymethylcellul-Glycerin (LUBRICATING EYE DROPS OP) Place 1 drop into both eyes daily as needed (dry eyes).     carvedilol (COREG) 25 MG tablet Take 1 tablet (25 mg total) by mouth 2 (two) times daily with a meal. 180 tablet 0   cetirizine (ZYRTEC) 10 MG tablet Take 10 mg by mouth daily as needed for allergies.     chlorthalidone (HYGROTON) 25 MG tablet Take 25 mg by mouth daily.     conjugated estrogens (PREMARIN) vaginal cream Place 1 applicator vaginally daily as needed (dryness / burning).     cyclobenzaprine (FLEXERIL) 10 MG tablet Take 10 mg by mouth 3 (three)  times daily as needed for muscle spasms.     diclofenac Sodium (VOLTAREN) 1 % GEL diclofenac 1 % topical gel (Patient taking differently: Apply 1 Application topically 4 (four) times daily as needed (pain).) 100 g 0   dicyclomine (BENTYL) 20 MG tablet Take 1 tablet (20 mg total) by mouth 2 (two) times daily as needed for spasms. 180 tablet 1   Erenumab-aooe (AIMOVIG) 140 MG/ML SOAJ Inject 140 mg as directed every 30 (thirty) days.     estradiol (ESTRACE) 0.1 MG/GM vaginal cream Place 1 Applicatorful vaginally at bedtime. Daily for 14 days then twice weekly 42.5 g 0   famotidine (PEPCID) 40 MG tablet TAKE 1 TABLET(40 MG) BY MOUTH AT BEDTIME 90 tablet 3   FLUoxetine (PROZAC) 20 MG capsule Take 3 capsules (60 mg total) by mouth daily. 270 capsule 0   fluticasone (FLONASE) 50 MCG/ACT nasal spray SHAKE LIQUID AND USE 2 SPRAYS IN EACH NOSTRIL DAILY 16 g 6  Fluticasone-Umeclidin-Vilant (TRELEGY ELLIPTA) 100-62.5-25 MCG/ACT AEPB Inhale 1 puff into the lungs daily. 3 each 3   itraconazole (SPORANOX) 100 MG capsule Take 2 capsules (200 mg total) by mouth 2 (two) times daily. 120 capsule 3   ketoconazole (NIZORAL) 2 % cream Apply 1 application. topically daily. (Patient taking differently: Apply 1 application  topically daily as needed (cracked skin).) 15 g 0   linaclotide (LINZESS) 145 MCG CAPS capsule Take 1 capsule (145 mcg total) by mouth daily before breakfast. (Patient taking differently: Take 145 mcg by mouth daily as needed (constipation).) 8 capsule 0   montelukast (SINGULAIR) 10 MG tablet TAKE 1 TABLET(10 MG) BY MOUTH AT BEDTIME 90 tablet 0   Multiple Vitamins-Minerals (MULTIVITAMIN WITH IRON-MINERALS) liquid Take 15 mLs by mouth daily.     ondansetron (ZOFRAN) 4 MG tablet TAKE 1 TABLET(4 MG) BY MOUTH EVERY 8 HOURS AS NEEDED FOR NAUSEA OR VOMITING 90 tablet 0   oxyCODONE (OXY IR/ROXICODONE) 5 MG immediate release tablet Take 5 mg by mouth every 6 (six) hours as needed.     pantoprazole (PROTONIX) 40  MG tablet TAKE 1 TABLET(40 MG) BY MOUTH DAILY 90 tablet 0   potassium chloride (MICRO-K) 10 MEQ CR capsule Take 2 capsules (20 mEq total) by mouth 2 (two) times daily. 360 capsule 1   promethazine (PHENERGAN) 25 MG tablet TAKE 1 TABLET BY MOUTH EVERY 8 HOURS IF NEEDED 20 tablet 0   rizatriptan (MAXALT) 10 MG tablet Take 1 tablet (10 mg total) by mouth as needed for migraine. May repeat in 2 hours if needed 10 tablet 0   sennosides-docusate sodium (SENOKOT-S) 8.6-50 MG tablet Take 1 tablet by mouth daily. 30 tablet 3   sulfamethoxazole-trimethoprim (BACTRIM DS) 800-160 MG tablet Take 1 tablet by mouth 2 (two) times daily. 14 tablet 0   vitamin B-12 (CYANOCOBALAMIN) 500 MCG tablet TAKE 1 TABLET BY MOUTH DAILY AT NOON 90 tablet 1   No facility-administered medications prior to visit.     Allergies  Allergen Reactions   Erythromycin Base Diarrhea   Vfend [Voriconazole]     Visual changes   Penicillins Rash    Social History   Tobacco Use   Smoking status: Every Day    Packs/day: 0.50    Years: 49.00    Additional pack years: 0.00    Total pack years: 24.50    Types: Cigarettes   Smokeless tobacco: Never   Tobacco comments:    3/4 ppd 08/30/21 Vernie Murders, CMA  Vaping Use   Vaping Use: Former   Start date: 01/01/2010   Quit date: 08/01/2021   Substances: Nicotine   Devices: menthol/fruit flavor  Substance Use Topics   Alcohol use: No    Alcohol/week: 0.0 standard drinks of alcohol   Drug use: No    Family History  Problem Relation Age of Onset   Breast cancer Mother    Migraines Other    Depression Other    Anxiety disorder Other    High Cholesterol Other    Colon cancer Neg Hx    Rectal cancer Neg Hx    Stomach cancer Neg Hx    Colon polyps Neg Hx    Esophageal cancer Neg Hx     Objective:  There were no vitals filed for this visit. There is no height or weight on file to calculate BMI.  Physical Exam Constitutional:      Appearance: Normal appearance.  HENT:      Head: Normocephalic and atraumatic.  Right Ear: Tympanic membrane normal.     Left Ear: Tympanic membrane normal.     Nose: Nose normal.     Mouth/Throat:     Mouth: Mucous membranes are moist.  Eyes:     Extraocular Movements: Extraocular movements intact.     Conjunctiva/sclera: Conjunctivae normal.     Pupils: Pupils are equal, round, and reactive to light.  Cardiovascular:     Rate and Rhythm: Normal rate and regular rhythm.     Heart sounds: No murmur heard.    No friction rub. No gallop.  Pulmonary:     Effort: Pulmonary effort is normal.     Breath sounds: Normal breath sounds.  Abdominal:     General: Abdomen is flat.     Palpations: Abdomen is soft.  Musculoskeletal:        General: Normal range of motion.  Skin:    General: Skin is warm and dry.  Neurological:     General: No focal deficit present.     Mental Status: She is alert and oriented to person, place, and time.  Psychiatric:        Mood and Affect: Mood normal.     Lab Results: Lab Results  Component Value Date   WBC 9.3 04/12/2022   HGB 13.1 04/12/2022   HCT 38.7 04/12/2022   MCV 85.6 04/12/2022   PLT 303 04/12/2022    Lab Results  Component Value Date   CREATININE 0.56 04/12/2022   BUN 12 04/12/2022   NA 126 (L) 04/12/2022   K 2.6 (LL) 04/12/2022   CL 86 (L) 04/12/2022   CO2 29 04/12/2022    Lab Results  Component Value Date   ALT 39 (H) 03/27/2022   AST 18 03/27/2022   ALKPHOS 53 03/27/2022   BILITOT 0.5 03/27/2022     Assessment & Plan:  #Aspergillus cavitary nodule #Hx of vaping #tobacco abuse #COPD #Medication monitoring As part of yearly low-dose CT (patient qualified due to prolonged history of tobacco abuse) found to have irregular focus of consolidation right upper lobe and four-point.  Recommended follow-up 3 months CT low-dose CT on 8/25 showed regression of the previously noted lesion of concern near the apex of the right upper lobe, but multiple new lesions  scattered throughout the lungs bilaterally which are highly aggressive in appearance.  As such PET ordered on 09/11/21  which showed 10 mm left upper lobe nodule , Index cavitary nodule right upper lobe measuring 10 mm , several tiny nodules throughout. She underwent bronch with bal and Bx on 9/28 right upper lobe cavitary nodule pathology returning for Aspergillus.  Fungal/AFB cultures have been negative. As single cavitary nodule+ aspergillus/3 nodules biopsied (with many nodules on imaging) and no change in respiratory symptoms plan was to  monitor lung findings with repeat CT.   CT chest with contrast on 12/26 showed 2 new small nodules, findings similar to most recent prior CT with multiple lung nodules, with aggressive appearance consistent with aspergillosis.  Cavitary right upper lobe nodule noted on prior exam has decreased in size(56mm).  Infrarenal abdominal aortic aneurysm measuring 3.1 cm emergency CT on 07/10/2021. -started vori but had blurry vision as such stopped -Started itra on 02/07/22. 02/09/22 ALP/AST/ALT 68/19/18, wbc 11.6, scr 0.50. Itra levels therapeutic(1.1 and 1.4 in March and on 5/13, respectively). Although pt had been taking at lower dose with c/f taking it inconsistently -Repeat CT on 5/14 showed multiple bilateral  pulmonary nodules, either decrease in size are stable since prior  study.  No new nodules. Plan: - Patient although inconsistently has been on itraconazole x 3 months.  Imaging is overall improved. - As such will stop Itraconazole and monitor clinically in 6 months.  #Abdominal aortic aneurysm #Tobacco abuse - Patient is a longtime smoker - Will defer to primary, communicated via secure chat.   #Elevated LFT(AST/ALT 49/80 on 05/14/22) -Likely 2/2 itra, will get CMP in 2 weeks. Discussed with pt to wither get labs done in her area or stop by lab.  Danelle Earthly, MD [facility]   05/22/22  11:23 AM   I  have personally spent 45 minutes involved in face-to-face and non-face-to-face activities for this patient on the day of the visit. Professional time spent includes the following activities: Preparing to see the patient (review of tests), Obtaining and/or reviewing separately obtained history (admission/discharge record), Performing a medically appropriate examination and/or evaluation , Ordering medications/tests/procedures, referring and communicating with other health care professionals, Documenting clinical information in the EMR, Independently interpreting results (not separately reported), Communicating results to the patient/family/caregiver, Counseling and educating the patient/family/caregiver and Care coordination (not separately reported).

## 2022-05-22 NOTE — Patient Instructions (Signed)
Stop your itraconazole  Please get labs in 2 weeks

## 2022-05-23 ENCOUNTER — Encounter: Payer: Self-pay | Admitting: Student

## 2022-05-23 ENCOUNTER — Ambulatory Visit: Payer: Medicare PPO | Admitting: Student

## 2022-05-23 VITALS — BP 138/72 | HR 77 | Temp 98.0°F | Ht 60.0 in | Wt 116.8 lb

## 2022-05-23 DIAGNOSIS — F17210 Nicotine dependence, cigarettes, uncomplicated: Secondary | ICD-10-CM | POA: Diagnosis not present

## 2022-05-23 DIAGNOSIS — J449 Chronic obstructive pulmonary disease, unspecified: Secondary | ICD-10-CM

## 2022-05-23 DIAGNOSIS — R918 Other nonspecific abnormal finding of lung field: Secondary | ICD-10-CM | POA: Diagnosis not present

## 2022-05-23 DIAGNOSIS — M19011 Primary osteoarthritis, right shoulder: Secondary | ICD-10-CM | POA: Diagnosis not present

## 2022-05-23 MED ORDER — ALBUTEROL SULFATE HFA 108 (90 BASE) MCG/ACT IN AERS: 2.0000 | INHALATION_SPRAY | Freq: Four times a day (QID) | RESPIRATORY_TRACT | 2 refills | Status: AC | PRN

## 2022-05-23 NOTE — Patient Instructions (Addendum)
-   continue trelegy 1 puff once daily, albuterol as needed - CT Chest in November and clinic visit afterward

## 2022-05-25 ENCOUNTER — Other Ambulatory Visit: Payer: Self-pay | Admitting: *Deleted

## 2022-05-25 ENCOUNTER — Other Ambulatory Visit: Payer: Self-pay | Admitting: Family Medicine

## 2022-05-25 DIAGNOSIS — I6523 Occlusion and stenosis of bilateral carotid arteries: Secondary | ICD-10-CM

## 2022-05-25 DIAGNOSIS — I7143 Infrarenal abdominal aortic aneurysm, without rupture: Secondary | ICD-10-CM

## 2022-05-29 ENCOUNTER — Other Ambulatory Visit: Payer: Self-pay | Admitting: Family Medicine

## 2022-05-29 DIAGNOSIS — I7143 Infrarenal abdominal aortic aneurysm, without rupture: Secondary | ICD-10-CM

## 2022-05-30 DIAGNOSIS — S22080A Wedge compression fracture of T11-T12 vertebra, initial encounter for closed fracture: Secondary | ICD-10-CM | POA: Diagnosis not present

## 2022-05-30 DIAGNOSIS — M8008XA Age-related osteoporosis with current pathological fracture, vertebra(e), initial encounter for fracture: Secondary | ICD-10-CM | POA: Diagnosis not present

## 2022-06-01 ENCOUNTER — Telehealth: Payer: Self-pay | Admitting: Family Medicine

## 2022-06-01 ENCOUNTER — Other Ambulatory Visit: Payer: Self-pay

## 2022-06-01 ENCOUNTER — Encounter: Payer: Self-pay | Admitting: Internal Medicine

## 2022-06-01 NOTE — Telephone Encounter (Signed)
-----   Message from Marla I Leal-Borjas, CMA sent at 06/01/2022 10:57 AM EDT ----- Regarding: RE: infrarenal aortic aneurysm She is going to [facility] [place] in next Wednesday and they are going to do ultrasound. She refused CT because [facility] will do some test. Please advice. ----- Message ----- From: Tor Tsuda, MD Sent: 05/29/2022   2:04 PM EDT To: [facility] Clinical Subject: infrarenal aortic aneurysm                     Patient found to have an infrarenal aortic aneurysm picked up on chest ct. I ordered a cta of abdomen and pelvis to get a better look at size of aneurysm. Dr. Asuncion Tapscott    

## 2022-06-01 NOTE — Telephone Encounter (Deleted)
-----   Message from Eugenie Norrie, CMA sent at 06/01/2022 10:57 AM EDT ----- Regarding: RE: infrarenal aortic aneurysm She is going to see doctor in [facility] [place] in next Wednesday and they are going to do ultrasound. She refused CT because [facility] will do some test. Please advice. ----- Message ----- From: Blane Ohara, MD Sent: 05/29/2022   2:04 PM EDT To: [facility]ais[facility] Clinical Subject: infrarenal aortic aneurysm                     Patient found to have an infrarenal aortic aneurysm picked up on chest ct. I ordered a cta of abdomen and pelvis to get a better look at size of aneurysm. Dr. Sedalia Muta

## 2022-06-01 NOTE — Telephone Encounter (Signed)
Seeing vascular. Appt was already made. Dr. Sedalia Muta

## 2022-06-05 ENCOUNTER — Ambulatory Visit (HOSPITAL_COMMUNITY): Payer: Medicare PPO

## 2022-06-05 ENCOUNTER — Ambulatory Visit: Payer: Medicare PPO | Admitting: Vascular Surgery

## 2022-06-07 ENCOUNTER — Encounter: Payer: Self-pay | Admitting: Internal Medicine

## 2022-06-14 DIAGNOSIS — K219 Gastro-esophageal reflux disease without esophagitis: Secondary | ICD-10-CM | POA: Diagnosis not present

## 2022-06-14 DIAGNOSIS — R109 Unspecified abdominal pain: Secondary | ICD-10-CM | POA: Diagnosis not present

## 2022-06-14 DIAGNOSIS — K5900 Constipation, unspecified: Secondary | ICD-10-CM | POA: Diagnosis not present

## 2022-06-14 DIAGNOSIS — N959 Unspecified menopausal and perimenopausal disorder: Secondary | ICD-10-CM | POA: Diagnosis not present

## 2022-06-14 DIAGNOSIS — R1310 Dysphagia, unspecified: Secondary | ICD-10-CM | POA: Diagnosis not present

## 2022-06-14 DIAGNOSIS — M810 Age-related osteoporosis without current pathological fracture: Secondary | ICD-10-CM | POA: Diagnosis not present

## 2022-06-14 LAB — HM DEXA SCAN

## 2022-06-15 ENCOUNTER — Encounter: Payer: Self-pay | Admitting: Family Medicine

## 2022-06-20 DIAGNOSIS — M8008XA Age-related osteoporosis with current pathological fracture, vertebra(e), initial encounter for fracture: Secondary | ICD-10-CM | POA: Diagnosis not present

## 2022-06-20 DIAGNOSIS — M5106 Intervertebral disc disorders with myelopathy, lumbar region: Secondary | ICD-10-CM | POA: Diagnosis not present

## 2022-06-20 DIAGNOSIS — S22079A Unspecified fracture of T9-T10 vertebra, initial encounter for closed fracture: Secondary | ICD-10-CM | POA: Diagnosis not present

## 2022-06-20 DIAGNOSIS — M47816 Spondylosis without myelopathy or radiculopathy, lumbar region: Secondary | ICD-10-CM | POA: Diagnosis not present

## 2022-06-20 DIAGNOSIS — Z6823 Body mass index (BMI) 23.0-23.9, adult: Secondary | ICD-10-CM | POA: Diagnosis not present

## 2022-06-23 ENCOUNTER — Emergency Department (HOSPITAL_COMMUNITY)
Admission: EM | Admit: 2022-06-23 | Discharge: 2022-06-23 | Disposition: A | Discharge status: Home / Self Care | Payer: Medicare PPO | Attending: Emergency Medicine | Admitting: Emergency Medicine

## 2022-06-23 ENCOUNTER — Emergency Department (HOSPITAL_COMMUNITY): Payer: Medicare PPO

## 2022-06-23 DIAGNOSIS — M546 Pain in thoracic spine: Secondary | ICD-10-CM | POA: Insufficient documentation

## 2022-06-23 DIAGNOSIS — R918 Other nonspecific abnormal finding of lung field: Secondary | ICD-10-CM | POA: Diagnosis not present

## 2022-06-23 DIAGNOSIS — W19XXXA Unspecified fall, initial encounter: Secondary | ICD-10-CM | POA: Diagnosis not present

## 2022-06-23 DIAGNOSIS — M25511 Pain in right shoulder: Secondary | ICD-10-CM | POA: Diagnosis not present

## 2022-06-23 DIAGNOSIS — Y92009 Unspecified place in unspecified non-institutional (private) residence as the place of occurrence of the external cause: Secondary | ICD-10-CM | POA: Diagnosis not present

## 2022-06-23 DIAGNOSIS — R0781 Pleurodynia: Secondary | ICD-10-CM | POA: Diagnosis not present

## 2022-06-23 DIAGNOSIS — S2241XA Multiple fractures of ribs, right side, initial encounter for closed fracture: Secondary | ICD-10-CM | POA: Diagnosis not present

## 2022-06-23 DIAGNOSIS — I700 Atherosclerosis of aorta: Secondary | ICD-10-CM | POA: Diagnosis not present

## 2022-06-23 MED ORDER — OXYCODONE-ACETAMINOPHEN 5-325 MG PO TABS
2.0000 | ORAL_TABLET | Freq: Once | ORAL | Status: AC
  Administered 2022-06-23: 2 via ORAL
  Filled 2022-06-23: qty 2

## 2022-06-23 NOTE — ED Provider Notes (Signed)
[place] EMERGENCY DEPARTMENT AT [facility] Provider Note   CSN: [id number] Arrival date & time: 06/23/22  1357     History Chief Complaint  Patient presents with   Fall    HPI Allison Jimenez is a 72 y.o. female presenting for chief complaint of ground-level fall.  72 year old female.  Extensive medical history including osteoporosis, chronic degenerative disc disease, multiple surgical interventions for lumbar pathology.  Brought in by her sister for ground-level fall 36 hours prior to arrival.  Wilson directly on her side has right thoracic rib pain.  Denies fevers chills, nausea vomiting, syncope or shortness of breath..   Patient's recorded medical, surgical, social, medication list and allergies were reviewed in the Snapshot window as part of the initial history.   Review of Systems   Review of Systems  Constitutional:  Negative for chills and fever.  HENT:  Negative for ear pain and sore throat.   Eyes:  Negative for pain and visual disturbance.  Respiratory:  Negative for cough and shortness of breath.   Cardiovascular:  Negative for chest pain and palpitations.  Gastrointestinal:  Negative for abdominal pain and vomiting.  Genitourinary:  Negative for dysuria and hematuria.  Musculoskeletal:  Negative for arthralgias and back pain.  Skin:  Negative for color change and rash.  Neurological:  Negative for seizures and syncope.  All other systems reviewed and are negative.   Physical Exam Updated Vital Signs BP 112/78 (BP Location: Left Arm)   Pulse 95   Temp 98.9 F (37.2 C) (Oral)   Resp 16   Ht 5' (1.524 m)   Wt 50.3 kg   SpO2 94%   BMI 21.68 kg/m  Physical Exam Vitals and nursing note reviewed.  Constitutional:      General: She is not in acute distress.    Appearance: She is well-developed.  HENT:     Head: Normocephalic and atraumatic.  Eyes:     Conjunctiva/sclera: Conjunctivae normal.  Cardiovascular:     Rate and Rhythm: Normal rate  and regular rhythm.     Heart sounds: No murmur heard. Pulmonary:     Effort: Pulmonary effort is normal. No respiratory distress.     Breath sounds: Normal breath sounds.  Chest:     Chest wall: Tenderness present.  Abdominal:     General: There is no distension.     Palpations: Abdomen is soft.     Tenderness: There is no abdominal tenderness. There is no right CVA tenderness or left CVA tenderness.  Musculoskeletal:        General: No swelling or tenderness. Normal range of motion.     Cervical back: Neck supple.  Skin:    General: Skin is warm and dry.  Neurological:     General: No focal deficit present.     Mental Status: She is alert and oriented to person, place, and time. Mental status is at baseline.     Cranial Nerves: No cranial nerve deficit.      ED Course/ Medical Decision Making/ A&P    Procedures Procedures   Medications Ordered in ED Medications  oxyCODONE-acetaminophen (PERCOCET/ROXICET) 5-325 MG per tablet 2 tablet (2 tablets Oral Given 06/23/22 1505)   Medical Decision Making:   [name] is a 72 y.o. female who presented to the ED today with a fall at their home  detailed above. They are not on a blood thinner. Additional history discussed with patient's family/caregivers.  Patient placed on continuous vitals and  telemetry monitoring while in ED which was reviewed periodically.   Complete initial physical exam performed, notably the patient  was hemodynamically stable  in no acute distress. No obvious deformities or injuries appreciated on extensive physical exam including active range of motion of all joints.     Reviewed and confirmed nursing documentation for past medical history, family history, social history.    Initial Assessment/Plan:   This is a patient presenting with a moderate blunt mechanism trauma.  As such, I have considered intracranial injuries including intracranial hemorrhage, intrathoracic injuries including blunt myocardial or  blunt lung injury, blunt abdominal injuries including aortic dissection, bladder injury, spleen injury, liver injury and I have considered orthopedic injuries including extremity or spinal injury. This is most consistent with an acute life/limb threatening illness complicated by underlying chronic conditions.  With the patient's presentation of moderate mechanism trauma but an otherwise reassuring exam, patient warrants targeted evaluation for potential traumatic injuries. Will proceed with targeted evaluation for potential injuries. Will proceed with CT chest Noncon  Images reviewed and agree with radiology interpretation.  CT Chest Wo Contrast  Result Date: 06/23/2022 CLINICAL DATA:  Larey Seat 4 a.m. yesterday landing on right side, right-sided thoracic cage pain EXAM: CT CHEST WITHOUT CONTRAST TECHNIQUE: Multidetector CT imaging of the chest was performed following the standard protocol without IV contrast. RADIATION DOSE REDUCTION: This exam was performed according to the departmental dose-optimization program which includes automated exposure control, adjustment of the mA and/or kV according to patient size and/or use of iterative reconstruction technique. COMPARISON:  05/15/2022 FINDINGS: Cardiovascular: Unenhanced imaging of the heart is unremarkable without pericardial effusion. Normal caliber of the thoracic aorta. Atherosclerosis of the aorta and coronary vasculature again noted. Mediastinum/Nodes: Thyroid and trachea are unremarkable. Fluid-filled esophagus is nonspecific and could be related to reflux. No esophageal wall thickening. No pathologic adenopathy. Lungs/Pleura: No acute airspace disease, effusion, or pneumothorax. Stable severe emphysema. Bilateral pulmonary nodules are again identified, index nodules as follows: Left upper lobe, image 31/4, 11 x 6 mm.  Stable. Left upper lobe, image 41/4, 13 x 8 mm.  Stable. Left lower lobe, image 61/4, 7 x 8 mm.  Stable. Left lower lobe, image 51/4, 3 x 5  mm.  Stable. Right upper lobe, image 40/4, 8 x 9 mm.  Stable. Right upper lobe, image 34/4, 7 x 7 mm.  Stable. There is a right upper lobe pulmonary nodule on image 59/4 that has increased in size since prior study, now measuring 7 x 8 mm where previously this had measured 4 x 5 mm. There is also increasing band like density in the anterior right upper lobe reference image 36/4 measuring 20 x 7 mm, previously measuring 3 x 8 mm. Upper Abdomen: No acute abnormality. Musculoskeletal: Prior compression deformities are seen at T12 and L1, with interval vertebral augmentation at T12 and stable L1 vertebral augmentation since the prior exam. There are new acute to subacute compression deformities within the superior endplates of the T9, T10, and T11 vertebral bodies, without significant loss of vertebral body height. Vacuum phenomenon is seen at the fracture lines within the T10 and T11 vertebral bodies. These fractures are new since MRI 05/22/2022. There is an acute to subacute healing right anterior fourth rib fracture near the costochondral junction. An acute nondisplaced right anterior fifth rib fracture is also identified, without evidence of callus formation or healing. Reconstructed images demonstrate no additional findings. IMPRESSION: 1. Acute to subacute right anterior fourth and fifth rib fractures. Please correlate with site  of patient's pain. 2. Interval development of acute to subacute compression deformities involving the superior endplates at T9, T10, and T11. These have developed since the MRI performed 05/22/2022. 3. Chronic compression deformities at T12 and L1, with vertebral augmentation at these levels. 4. Multiple pulmonary nodules as above, the majority of which are stable since prior study. A 7 mm mean diameter right upper lobe nodule has increased in size since prior study where it had measured 4 mm in mean diameter. Given previous waxing and waning appearance is a pulmonary nodules, continued  follow-up in 3 months with noncontrast chest CT is recommended. 5. Increasing subpleural band like consolidation within the anterior right upper lobe, favoring atelectasis or scarring. This could also be reassessed at the time of follow-up CT. 6. Aortic Atherosclerosis (ICD10-I70.0). Coronary artery atherosclerosis. Electronically Signed   By: Sharlet Salina M.D.   On: 06/23/2022 15:40    Disposition:  I have considered need for hospitalization, however, considering all of the above, I believe this patient is stable for discharge at this time.  Patient/family educated about specific return precautions for given chief complaint and symptoms.  Patient/family educated about follow-up with PCP.     Patient/family expressed understanding of return precautions and need for follow-up. Patient spoken to regarding all imaging and laboratory results and appropriate follow up for these results. All education provided in verbal form with additional information in written form. Time was allowed for answering of patient questions. Patient discharged.    Emergency Department Medication Summary:   Medications  oxyCODONE-acetaminophen (PERCOCET/ROXICET) 5-325 MG per tablet 2 tablet (2 tablets Oral Given 06/23/22 1505)            Clinical Impression:  1. Fall, initial encounter      Discharge   Final Clinical Impression(s) / ED Diagnoses Final diagnoses:  Fall, initial encounter    Rx / DC Orders ED Discharge Orders     None         Glyn Ade, MD 06/23/22 [phone]

## 2022-06-23 NOTE — ED Triage Notes (Addendum)
Patient reports falling around 4 am yesterday Landed on right side Reports pain in right ribcage area 3/10 Denies LOC Did not hit head Denies use of anticoag Pain worse with inhalation Mechanical fall, tripped over baseboard molding

## 2022-06-23 NOTE — Discharge Instructions (Addendum)
You were seen today for a fall and have 2 rib fractures.  You also have numerous pulmonary nodules that need to be followed up by your PCP for frequent checks to ensure they are not cancerous.

## 2022-07-02 ENCOUNTER — Telehealth: Payer: Self-pay | Admitting: *Deleted

## 2022-07-02 NOTE — Telephone Encounter (Signed)
Transition Care Management Unsuccessful Follow-up Telephone Call  Date of discharge and from where:  [facility] 06/23/2022  Attempts:  2nd Attempt  Reason for unsuccessful TCM follow-up call:  Left voice message

## 2022-07-02 NOTE — Telephone Encounter (Signed)
Transition Care Management Unsuccessful Follow-up Telephone Call  Date of discharge and from where:  [facility] 06/23/2022  Attempts:  1st Attempt  Reason for unsuccessful TCM follow-up call:  Left voice message

## 2022-07-03 DIAGNOSIS — M8008XA Age-related osteoporosis with current pathological fracture, vertebra(e), initial encounter for fracture: Secondary | ICD-10-CM | POA: Diagnosis not present

## 2022-07-03 DIAGNOSIS — S22080A Wedge compression fracture of T11-T12 vertebra, initial encounter for closed fracture: Secondary | ICD-10-CM | POA: Diagnosis not present

## 2022-07-03 DIAGNOSIS — S22070A Wedge compression fracture of T9-T10 vertebra, initial encounter for closed fracture: Secondary | ICD-10-CM | POA: Diagnosis not present

## 2022-07-12 DIAGNOSIS — K219 Gastro-esophageal reflux disease without esophagitis: Secondary | ICD-10-CM | POA: Diagnosis not present

## 2022-07-12 DIAGNOSIS — R112 Nausea with vomiting, unspecified: Secondary | ICD-10-CM | POA: Diagnosis not present

## 2022-07-12 DIAGNOSIS — R1310 Dysphagia, unspecified: Secondary | ICD-10-CM | POA: Diagnosis not present

## 2022-07-12 DIAGNOSIS — K5900 Constipation, unspecified: Secondary | ICD-10-CM | POA: Diagnosis not present

## 2022-07-12 DIAGNOSIS — R109 Unspecified abdominal pain: Secondary | ICD-10-CM | POA: Diagnosis not present

## 2022-07-19 ENCOUNTER — Other Ambulatory Visit: Payer: Self-pay

## 2022-07-20 MED ORDER — CYCLOBENZAPRINE HCL 10 MG PO TABS: 10.0000 mg | ORAL_TABLET | Freq: Three times a day (TID) | ORAL | 2 refills | Status: DC | PRN

## 2022-07-20 MED ORDER — PROMETHAZINE HCL 25 MG PO TABS: ORAL_TABLET | ORAL | 0 refills | Status: DC

## 2022-07-24 ENCOUNTER — Observation Stay (HOSPITAL_BASED_OUTPATIENT_CLINIC_OR_DEPARTMENT_OTHER)
Admission: EM | Admit: 2022-07-24 | Discharge: 2022-07-25 | Disposition: A | Discharge status: Home / Self Care | Payer: Medicare PPO | Attending: Internal Medicine | Admitting: Internal Medicine

## 2022-07-24 ENCOUNTER — Other Ambulatory Visit: Payer: Self-pay

## 2022-07-24 ENCOUNTER — Emergency Department (HOSPITAL_BASED_OUTPATIENT_CLINIC_OR_DEPARTMENT_OTHER): Payer: Medicare PPO

## 2022-07-24 ENCOUNTER — Encounter (HOSPITAL_BASED_OUTPATIENT_CLINIC_OR_DEPARTMENT_OTHER): Payer: Self-pay | Admitting: Emergency Medicine

## 2022-07-24 ENCOUNTER — Emergency Department (HOSPITAL_COMMUNITY)
Admission: EM | Admit: 2022-07-24 | Discharge: 2022-07-24 | Disposition: A | Discharge status: Home / Self Care | Payer: Medicare PPO | Source: Home / Self Care

## 2022-07-24 ENCOUNTER — Telehealth: Payer: Self-pay

## 2022-07-24 DIAGNOSIS — I7143 Infrarenal abdominal aortic aneurysm, without rupture: Secondary | ICD-10-CM | POA: Diagnosis not present

## 2022-07-24 DIAGNOSIS — Z79899 Other long term (current) drug therapy: Secondary | ICD-10-CM | POA: Insufficient documentation

## 2022-07-24 DIAGNOSIS — R112 Nausea with vomiting, unspecified: Secondary | ICD-10-CM | POA: Insufficient documentation

## 2022-07-24 DIAGNOSIS — N3001 Acute cystitis with hematuria: Secondary | ICD-10-CM | POA: Diagnosis not present

## 2022-07-24 DIAGNOSIS — R1013 Epigastric pain: Secondary | ICD-10-CM | POA: Diagnosis not present

## 2022-07-24 DIAGNOSIS — K439 Ventral hernia without obstruction or gangrene: Secondary | ICD-10-CM | POA: Diagnosis not present

## 2022-07-24 DIAGNOSIS — D72829 Elevated white blood cell count, unspecified: Secondary | ICD-10-CM | POA: Insufficient documentation

## 2022-07-24 DIAGNOSIS — N3000 Acute cystitis without hematuria: Secondary | ICD-10-CM

## 2022-07-24 DIAGNOSIS — E8721 Acute metabolic acidosis: Secondary | ICD-10-CM | POA: Insufficient documentation

## 2022-07-24 DIAGNOSIS — R000 Tachycardia, unspecified: Secondary | ICD-10-CM | POA: Diagnosis not present

## 2022-07-24 DIAGNOSIS — R109 Unspecified abdominal pain: Secondary | ICD-10-CM | POA: Insufficient documentation

## 2022-07-24 DIAGNOSIS — E876 Hypokalemia: Secondary | ICD-10-CM | POA: Insufficient documentation

## 2022-07-24 DIAGNOSIS — Z5321 Procedure and treatment not carried out due to patient leaving prior to being seen by health care provider: Secondary | ICD-10-CM | POA: Insufficient documentation

## 2022-07-24 DIAGNOSIS — F411 Generalized anxiety disorder: Secondary | ICD-10-CM | POA: Diagnosis present

## 2022-07-24 DIAGNOSIS — F17210 Nicotine dependence, cigarettes, uncomplicated: Secondary | ICD-10-CM | POA: Diagnosis not present

## 2022-07-24 DIAGNOSIS — I700 Atherosclerosis of aorta: Secondary | ICD-10-CM | POA: Diagnosis not present

## 2022-07-24 DIAGNOSIS — Z1152 Encounter for screening for COVID-19: Secondary | ICD-10-CM | POA: Diagnosis not present

## 2022-07-24 DIAGNOSIS — J449 Chronic obstructive pulmonary disease, unspecified: Secondary | ICD-10-CM | POA: Diagnosis not present

## 2022-07-24 DIAGNOSIS — R918 Other nonspecific abnormal finding of lung field: Secondary | ICD-10-CM | POA: Diagnosis not present

## 2022-07-24 DIAGNOSIS — E871 Hypo-osmolality and hyponatremia: Secondary | ICD-10-CM | POA: Diagnosis present

## 2022-07-24 DIAGNOSIS — I10 Essential (primary) hypertension: Secondary | ICD-10-CM | POA: Diagnosis not present

## 2022-07-24 DIAGNOSIS — N200 Calculus of kidney: Secondary | ICD-10-CM | POA: Diagnosis not present

## 2022-07-24 DIAGNOSIS — E785 Hyperlipidemia, unspecified: Secondary | ICD-10-CM | POA: Diagnosis present

## 2022-07-24 DIAGNOSIS — A419 Sepsis, unspecified organism: Principal | ICD-10-CM | POA: Insufficient documentation

## 2022-07-24 LAB — CBC
HCT: 38.1 % (ref 36.0–46.0)
Hemoglobin: 12.4 g/dL (ref 12.0–15.0)
MCH: 29.2 pg (ref 26.0–34.0)
MCHC: 32.5 g/dL (ref 30.0–36.0)
MCV: 89.6 fL (ref 80.0–100.0)
Platelets: 582 10*3/uL — ABNORMAL HIGH (ref 150–400)
RBC: 4.25 MIL/uL (ref 3.87–5.11)
RDW: 15 % (ref 11.5–15.5)
WBC: 27.2 10*3/uL — ABNORMAL HIGH (ref 4.0–10.5)
nRBC: 0 % (ref 0.0–0.2)

## 2022-07-24 LAB — URINALYSIS, W/ REFLEX TO CULTURE (INFECTION SUSPECTED)
Bilirubin Urine: NEGATIVE
Glucose, UA: NEGATIVE mg/dL
Nitrite: NEGATIVE
Protein, ur: >300 mg/dL — AB
Specific Gravity, Urine: 1.03 (ref 1.005–1.030)
WBC, UA: >50 WBC/hpf (ref 0–5)
pH: 6 (ref 5.0–8.0)

## 2022-07-24 LAB — COMPREHENSIVE METABOLIC PANEL
ALT: 12 U/L (ref 0–44)
AST: 16 U/L (ref 15–41)
Albumin: 2.9 g/dL — ABNORMAL LOW (ref 3.5–5.0)
Alkaline Phosphatase: 213 U/L — ABNORMAL HIGH (ref 38–126)
Anion gap: 10 (ref 5–15)
BUN: 12 mg/dL (ref 8–23)
CO2: 24 mmol/L (ref 22–32)
Calcium: 8.3 mg/dL — ABNORMAL LOW (ref 8.9–10.3)
Chloride: 96 mmol/L — ABNORMAL LOW (ref 98–111)
Creatinine, Ser: 0.62 mg/dL (ref 0.44–1.00)
GFR, Estimated: >60 mL/min (ref >60)
Glucose, Bld: 108 mg/dL — ABNORMAL HIGH (ref 70–99)
Potassium: 3.8 mmol/L (ref 3.5–5.1)
Sodium: 130 mmol/L — ABNORMAL LOW (ref 135–145)
Total Bilirubin: 0.5 mg/dL (ref 0.3–1.2)
Total Protein: 6 g/dL — ABNORMAL LOW (ref 6.5–8.1)

## 2022-07-24 LAB — LACTIC ACID, PLASMA
Lactic Acid, Venous: 1.4 mmol/L (ref 0.5–1.9)
Lactic Acid, Venous: 0.9 mmol/L (ref 0.5–1.9)

## 2022-07-24 LAB — RESP PANEL BY RT-PCR (RSV, FLU A&B, COVID)  RVPGX2
Influenza A by PCR: NEGATIVE
Influenza B by PCR: NEGATIVE
Resp Syncytial Virus by PCR: NEGATIVE
SARS Coronavirus 2 by RT PCR: NEGATIVE

## 2022-07-24 LAB — LIPASE, BLOOD: Lipase: 15 U/L (ref 11–51)

## 2022-07-24 LAB — PROTIME-INR
INR: 1 (ref 0.8–1.2)
Prothrombin Time: 12.8 seconds (ref 11.4–15.2)

## 2022-07-24 LAB — APTT: aPTT: 37 seconds — ABNORMAL HIGH (ref 24–36)

## 2022-07-24 MED ORDER — ENOXAPARIN SODIUM 40 MG/0.4ML IJ SOSY
40.0000 mg | PREFILLED_SYRINGE | INTRAMUSCULAR | Status: DC
  Filled 2022-07-24: qty 0.4

## 2022-07-24 MED ORDER — LACTATED RINGERS IV SOLN: INTRAVENOUS | Status: DC

## 2022-07-24 MED ORDER — FAMOTIDINE 20 MG PO TABS
40.0000 mg | ORAL_TABLET | Freq: Every day | ORAL | Status: DC
  Administered 2022-07-24: 40 mg via ORAL
  Filled 2022-07-24: qty 2

## 2022-07-24 MED ORDER — IOHEXOL 300 MG/ML  SOLN
100.0000 mL | Freq: Once | INTRAMUSCULAR | Status: AC | PRN
  Administered 2022-07-24: 60 mL via INTRAVENOUS

## 2022-07-24 MED ORDER — SODIUM CHLORIDE 0.9 % IV SOLN
1.0000 g | Freq: Once | INTRAVENOUS | Status: AC
  Administered 2022-07-24: 1 g via INTRAVENOUS
  Filled 2022-07-24: qty 10

## 2022-07-24 MED ORDER — ACETAMINOPHEN 650 MG RE SUPP: 650.0000 mg | Freq: Four times a day (QID) | RECTAL | Status: DC | PRN

## 2022-07-24 MED ORDER — SODIUM CHLORIDE 0.9 % IV BOLUS (SEPSIS)
1000.0000 mL | Freq: Once | INTRAVENOUS | Status: AC
  Administered 2022-07-24: 1000 mL via INTRAVENOUS

## 2022-07-24 MED ORDER — ONDANSETRON HCL 4 MG/2ML IJ SOLN
4.0000 mg | Freq: Once | INTRAMUSCULAR | Status: AC
  Administered 2022-07-24: 4 mg via INTRAVENOUS
  Filled 2022-07-24: qty 2

## 2022-07-24 MED ORDER — ALPRAZOLAM 0.25 MG PO TABS
0.2500 mg | ORAL_TABLET | Freq: Three times a day (TID) | ORAL | Status: DC | PRN
  Administered 2022-07-24: 0.25 mg via ORAL
  Filled 2022-07-24: qty 1

## 2022-07-24 MED ORDER — FLUTICASONE FUROATE-VILANTEROL 100-25 MCG/ACT IN AEPB
1.0000 | INHALATION_SPRAY | Freq: Every day | RESPIRATORY_TRACT | Status: DC
  Administered 2022-07-25: 1 via RESPIRATORY_TRACT
  Filled 2022-07-24: qty 28

## 2022-07-24 MED ORDER — ATORVASTATIN CALCIUM 10 MG PO TABS
20.0000 mg | ORAL_TABLET | Freq: Every day | ORAL | Status: DC
  Administered 2022-07-25: 20 mg via ORAL
  Filled 2022-07-24: qty 2

## 2022-07-24 MED ORDER — CARVEDILOL 25 MG PO TABS
25.0000 mg | ORAL_TABLET | Freq: Two times a day (BID) | ORAL | Status: DC
  Administered 2022-07-25: 25 mg via ORAL
  Filled 2022-07-24: qty 1

## 2022-07-24 MED ORDER — SODIUM CHLORIDE 0.9 % IV SOLN: INTRAVENOUS | Status: AC

## 2022-07-24 MED ORDER — SENNOSIDES-DOCUSATE SODIUM 8.6-50 MG PO TABS: 1.0000 | ORAL_TABLET | Freq: Every evening | ORAL | Status: DC | PRN

## 2022-07-24 MED ORDER — UMECLIDINIUM BROMIDE 62.5 MCG/ACT IN AEPB
1.0000 | INHALATION_SPRAY | Freq: Every day | RESPIRATORY_TRACT | Status: DC
  Administered 2022-07-25: 1 via RESPIRATORY_TRACT
  Filled 2022-07-24: qty 7

## 2022-07-24 MED ORDER — ONDANSETRON HCL 4 MG PO TABS: 4.0000 mg | ORAL_TABLET | Freq: Four times a day (QID) | ORAL | Status: DC | PRN

## 2022-07-24 MED ORDER — MONTELUKAST SODIUM 10 MG PO TABS
10.0000 mg | ORAL_TABLET | Freq: Every day | ORAL | Status: DC
  Administered 2022-07-24: 10 mg via ORAL
  Filled 2022-07-24: qty 1

## 2022-07-24 MED ORDER — ONDANSETRON HCL 4 MG/2ML IJ SOLN: 4.0000 mg | Freq: Four times a day (QID) | INTRAMUSCULAR | Status: DC | PRN

## 2022-07-24 MED ORDER — ACETAMINOPHEN 325 MG PO TABS: 650.0000 mg | ORAL_TABLET | Freq: Four times a day (QID) | ORAL | Status: DC | PRN

## 2022-07-24 MED ORDER — METRONIDAZOLE 500 MG/100ML IV SOLN
500.0000 mg | Freq: Once | INTRAVENOUS | Status: AC
  Administered 2022-07-24: 500 mg via INTRAVENOUS
  Filled 2022-07-24: qty 100

## 2022-07-24 MED ORDER — SODIUM CHLORIDE 0.9 % IV SOLN
1.0000 g | INTRAVENOUS | Status: DC
  Administered 2022-07-25: 1 g via INTRAVENOUS
  Filled 2022-07-24: qty 10

## 2022-07-24 MED ORDER — SODIUM CHLORIDE 0.9 % IV SOLN: Freq: Once | INTRAVENOUS | Status: AC

## 2022-07-24 MED ORDER — ALBUTEROL SULFATE (2.5 MG/3ML) 0.083% IN NEBU: 3.0000 mL | INHALATION_SOLUTION | Freq: Four times a day (QID) | RESPIRATORY_TRACT | Status: DC | PRN

## 2022-07-24 MED ORDER — FLUOXETINE HCL 20 MG PO CAPS
60.0000 mg | ORAL_CAPSULE | Freq: Every day | ORAL | Status: DC
  Administered 2022-07-25: 60 mg via ORAL
  Filled 2022-07-24: qty 3

## 2022-07-24 NOTE — Telephone Encounter (Signed)
The pt called today to wanting to see about an appt for nausea, vomiting, abdominal pain, diarrhea, weakness, weight loss, some dizziness and lightheadedness (pt stated that her medication can make her have dizziness and lightheadedness). The patient feels that she will need IV fluids. Pt notified that I spoke with Dr. Sedalia Muta, who has advised the pt to go to the ed [facility]. The pt given the address and name to [facility], [address], [place], [place] [address]. The pt asked that I speak with her sister, [name]. [name] stated that Judys sxs started 2 weeks ago and has had a loss of appetite and her vomit is now a green color this morning. [name] is going to take her to the [facility].

## 2022-07-24 NOTE — ED Provider Notes (Signed)
[name] Provider Note   CSN: [id number] Arrival date & time: 07/24/22  1508     History  Chief Complaint  Patient presents with   Nausea   Emesis   Diarrhea    Allison Jimenez is a 72 y.o. female.  Patient is a 72 year old female with past medical history of COPD, hypertension hyperlipidemia, diabetes presenting for complaints of nausea and vomiting.  Patient admits to worsening generalized weakness, nausea, vomiting, and epigastric abdominal pain over the past week.  Admits to decreased appetite.  States she had some delirium last week that has now resolved.  Denies chest pain, shortness of breath, or coughing.  Denies dysuria, increased frequency, urgency.  On arrival to ED patient presenting with concerning signs or symptoms of sepsis with tachycardia and unwell appearance.  The history is provided by the patient. No language interpreter was used.  Emesis Associated symptoms: abdominal pain and diarrhea   Associated symptoms: no arthralgias, no chills, no cough, no fever and no sore throat   Diarrhea Associated symptoms: abdominal pain and vomiting   Associated symptoms: no arthralgias, no chills and no fever        Home Medications Prior to Admission medications   Medication Sig Start Date End Date Taking? Authorizing Provider  calcitonin, salmon, (MIACALCIN/FORTICAL) 200 UNIT/ACT nasal spray Place 1 spray into alternate nostrils daily. 07/20/22  Yes [provider]  oxyCODONE-acetaminophen (PERCOCET) 10-325 MG tablet Take 1 tablet by mouth 4 (four) times daily as needed. 06/20/22  Yes [provider]  pregabalin (LYRICA) 25 MG capsule Take 25 mg by mouth 3 (three) times daily. 07/20/22  Yes [provider]  sucralfate (CARAFATE) 1 g tablet Take 1 g by mouth 4 (four) times daily. 06/14/22  Yes [provider]  acetaminophen (TYLENOL) 500 MG tablet Take 500-1,000 mg by mouth every 6 (six) hours  as needed for moderate pain.    [provider]  albuterol (VENTOLIN HFA) 108 (90 Base) MCG/ACT inhaler Inhale 2 puffs into the lungs every 6 (six) hours as needed for shortness of breath or wheezing. 05/23/22   Omar Person, MD  ALPRAZolam Prudy Feeler) 0.25 MG tablet TAKE 1 TABLET(0.25 MG) BY MOUTH THREE TIMES DAILY 03/22/22   Cox, Kirsten, MD  atorvastatin (LIPITOR) 20 MG tablet TAKE 1 TABLET(20 MG) BY MOUTH DAILY 02/05/22   Cox, Kirsten, MD  Carboxymethylcellul-Glycerin (LUBRICATING EYE DROPS OP) Place 1 drop into both eyes daily as needed (dry eyes).    [provider]  carvedilol (COREG) 25 MG tablet Take 1 tablet (25 mg total) by mouth 2 (two) times daily with a meal. 04/09/22   Cox, Kirsten, MD  cetirizine (ZYRTEC) 10 MG tablet Take 10 mg by mouth daily as needed for allergies.    [provider]  chlorthalidone (HYGROTON) 25 MG tablet Take 25 mg by mouth daily.    [provider]  conjugated estrogens (PREMARIN) vaginal cream Place 1 applicator vaginally daily as needed (dryness / burning).    [provider]  cyclobenzaprine (FLEXERIL) 10 MG tablet Take 1 tablet (10 mg total) by mouth 3 (three) times daily as needed for muscle spasms. 07/20/22   Cox, Fritzi Mandes, MD  diclofenac Sodium (VOLTAREN) 1 % GEL diclofenac 1 % topical gel Patient taking differently: Apply 1 Application topically 4 (four) times daily as needed (pain). 06/15/19   Cox, Fritzi Mandes, MD  dicyclomine (BENTYL) 20 MG tablet Take 1 tablet (20 mg total) by mouth 2 (two) times  daily as needed for spasms. 04/20/21   Cox, Kirsten, MD  Erenumab-aooe (AIMOVIG) 140 MG/ML SOAJ Inject 140 mg as directed every 30 (thirty) days.    [provider]  estradiol (ESTRACE) 0.1 MG/GM vaginal cream Place 1 Applicatorful vaginally at bedtime. Daily for 14 days then twice weekly 01/20/21   Cox, Fritzi Mandes, MD  famotidine (PEPCID) 40 MG tablet TAKE 1 TABLET(40 MG) BY MOUTH AT BEDTIME 03/07/22   Cox, Kirsten, MD   FLUoxetine (PROZAC) 20 MG capsule Take 3 capsules (60 mg total) by mouth daily. 04/09/22   Cox, Fritzi Mandes, MD  fluticasone [facility]) 50 MCG/ACT nasal spray SHAKE LIQUID AND USE 2 SPRAYS IN [facility] NOSTRIL DAILY 11/26/21   Janie Morning, NP  Fluticasone-Umeclidin-Vilant (TRELEGY ELLIPTA) 100-62.5-25 MCG/ACT AEPB Inhale 1 puff into the lungs daily. 04/25/22   Cox, Fritzi Mandes, MD  ketoconazole (NIZORAL) 2 % cream Apply 1 application. topically daily. Patient taking differently: Apply 1 application  topically daily as needed (cracked skin). 05/09/21   CoxFritzi Mandes, MD  linaclotide [facility]) 145 MCG CAPS capsule Take 1 capsule (145 mcg total) by mouth daily before breakfast. Patient taking differently: Take 145 mcg by mouth daily as needed (constipation). 05/09/21   Cox, Kirsten, MD  montelukast (SINGULAIR) 10 MG tablet TAKE 1 TABLET(10 MG) BY MOUTH AT BEDTIME 05/21/22   Cox, Kirsten, MD  Multiple Vitamins-Minerals (MULTIVITAMIN WITH IRON-MINERALS) liquid Take 15 mLs by mouth daily.    [provider]  ondansetron (ZOFRAN) 4 MG tablet TAKE 1 TABLET(4 MG) BY MOUTH EVERY 8 HOURS AS NEEDED FOR NAUSEA OR VOMITING 05/21/22   Cox, Kirsten, MD  oxyCODONE (OXY IR/ROXICODONE) 5 MG immediate release tablet Take 5 mg by mouth every 6 (six) hours as needed. 05/07/22   [provider]  pantoprazole (PROTONIX) 40 MG tablet TAKE 1 TABLET(40 MG) BY MOUTH DAILY 05/29/22   Cox, Kirsten, MD  potassium chloride (MICRO-K) 10 MEQ CR capsule Take 2 capsules (20 mEq total) by mouth 2 (two) times daily. 03/06/22   Cox, Fritzi Mandes, MD  promethazine (PHENERGAN) 25 MG tablet TAKE 1 TABLET BY MOUTH EVERY 8 HOURS IF NEEDED 07/20/22   Cox, Kirsten, MD  rizatriptan (MAXALT) 10 MG tablet Take 1 tablet (10 mg total) by mouth as needed for migraine. May repeat in 2 hours if needed 04/30/17   Drema Dallas, DO  sennosides-docusate sodium (SENOKOT-S) 8.6-50 MG tablet Take 1 tablet by mouth daily. 03/27/22   Cox, Fritzi Mandes, MD  vitamin B-12  (CYANOCOBALAMIN) 500 MCG tablet TAKE 1 TABLET BY MOUTH DAILY AT NOON 02/26/22   CoxFritzi Mandes, MD      Allergies    Erythromycin base, Vfend [voriconazole], and Penicillins    Review of Systems   Review of Systems  Constitutional:  Negative for chills and fever.  HENT:  Negative for ear pain and sore throat.   Eyes:  Negative for pain and visual disturbance.  Respiratory:  Negative for cough and shortness of breath.   Cardiovascular:  Negative for chest pain and palpitations.  Gastrointestinal:  Positive for abdominal pain, diarrhea, nausea and vomiting.  Genitourinary:  Negative for dysuria and hematuria.  Musculoskeletal:  Negative for arthralgias and back pain.  Skin:  Negative for color change and rash.  Neurological:  Positive for weakness. Negative for seizures and syncope.  All other systems reviewed and are negative.   Physical Exam Updated Vital Signs BP (!) 144/81   Pulse (!) 102   Temp 97.6 F (36.4 C) (Oral)   Resp 17  Ht 5' (1.524 m)   Wt 47.6 kg   SpO2 99%   BMI 20.51 kg/m  Physical Exam Vitals and nursing note reviewed.  Constitutional:      General: She is not in acute distress.    Appearance: She is well-developed.  HENT:     Head: Normocephalic and atraumatic.   Eyes:     Conjunctiva/sclera: Conjunctivae normal.  Cardiovascular:     Rate and Rhythm: Normal rate and regular rhythm.     Heart sounds: No murmur heard. Pulmonary:     Effort: Pulmonary effort is normal. No respiratory distress.     Breath sounds: Normal breath sounds.  Abdominal:     Palpations: Abdomen is soft.     Tenderness: There is abdominal tenderness in the epigastric area.  Musculoskeletal:        General: No swelling.     Cervical back: Neck supple.     Right lower leg: 2+ Edema present.     Left lower leg: 2+ Edema present.  Skin:    General: Skin is warm and dry.     Capillary Refill: Capillary refill takes less than 2 seconds.  Neurological:     Mental Status: She  is alert and oriented to person, place, and time.     GCS: GCS eye subscore is 4. GCS verbal subscore is 5. GCS motor subscore is 6.  Psychiatric:        Mood and Affect: Mood normal.     ED Results / Procedures / Treatments   Labs (all labs ordered are listed, but only abnormal results are displayed) Labs Reviewed  COMPREHENSIVE METABOLIC PANEL - Abnormal; Notable for the following components:      Result Value   Sodium 130 (*)    Chloride 96 (*)    Glucose, Bld 108 (*)    Calcium 8.3 (*)    Total Protein 6.0 (*)    Albumin 2.9 (*)    Alkaline Phosphatase 213 (*)    All other components within normal limits  CBC - Abnormal; Notable for the following components:   WBC 27.2 (*)    Platelets 582 (*)    All other components within normal limits  APTT - Abnormal; Notable for the following components:   aPTT 37 (*)    All other components within normal limits  URINALYSIS, W/ REFLEX TO CULTURE (INFECTION SUSPECTED) - Abnormal; Notable for the following components:   APPearance CLOUDY (*)    Hgb urine dipstick SMALL (*)    Ketones, ur TRACE (*)    Protein, ur >300 (*)    Leukocytes,Ua LARGE (*)    Bacteria, UA RARE (*)    All other components within normal limits  RESP PANEL BY RT-PCR (RSV, FLU A&B, COVID)  RVPGX2  CULTURE, BLOOD (ROUTINE X 2)  CULTURE, BLOOD (ROUTINE X 2)  URINE CULTURE  LIPASE, BLOOD  LACTIC ACID, PLASMA  LACTIC ACID, PLASMA  PROTIME-INR    EKG None  Radiology CT ABDOMEN PELVIS W CONTRAST  Result Date: 07/24/2022 CLINICAL DATA:  Epigastric pain, nausea, vomiting EXAM: CT ABDOMEN AND PELVIS WITH CONTRAST TECHNIQUE: Multidetector CT imaging of the abdomen and pelvis was performed using the standard protocol following bolus administration of intravenous contrast. RADIATION DOSE REDUCTION: This exam was performed according to the departmental dose-optimization program which includes automated exposure control, adjustment of the mA and/or kV according to  patient size and/or use of iterative reconstruction technique. CONTRAST:  60mL OMNIPAQUE IOHEXOL 300 MG/ML  SOLN COMPARISON:  07/10/2021 FINDINGS: Lower chest: Small patchy infiltrates are seen in the medial segment of right middle lobe. There are small linear densities in the posterior lower lung fields. Hepatobiliary: There is fatty infiltration in liver. Surgical clips are seen in gallbladder fossa. There is no dilation of bile ducts. There is decreased density in liver close to falciform ligament, possibly due to aberrant venous drainage or more pronounced fatty infiltration. Pancreas: No focal abnormalities are seen. Spleen: Unremarkable. Adrenals/Urinary Tract: Adrenals are unremarkable. There is no hydronephrosis. There is 4 mm calculus in the upper pole of right kidney. Possible subcentimeter right renal cyst. Ureters are not dilated. Urinary bladder is not distended. Stomach/Bowel: Stomach is not distended. Small bowel loops are not dilated. Appendix is not distinctly seen. There is no pericecal inflammation. There is no significant wall thickening in colon. Colon is not distended. There is no pericolic stranding or fluid collection. Vascular/Lymphatic: There is 4.2 cm infrarenal aortic aneurysm. Atherosclerotic plaques and calcifications are seen in the aorta and its major branches. Reproductive: Uterus appears smaller than usual in size. No adnexal masses are seen. Other: There is no ascites or pneumoperitoneum. There is small ventral hernia in suprapubic region. Musculoskeletal: There are old compression fractures in the bodies of T10, T11, T12 and L1 vertebrae along with kyphoplasty. IMPRESSION: There is no evidence of intestinal obstruction or pneumoperitoneum. There is no hydronephrosis. There is small patchy infiltrate in medial segment of right middle lobe suggesting atelectasis/pneumonia. Aortic arteriosclerosis. There is a 4.2 cm aneurysm in the infrarenal abdominal aorta. Recommend follow-up  every 12 months and vascular consultation. Reference: J Am Coll Radiol 2013;10:789-794. Fatty liver. Right renal stone. Right renal cysts. Old compression fractures and kyphoplasty in lower thoracic and upper lumbar spine. Electronically Signed   By: Ernie Avena M.D.   On: 07/24/2022 17:56   DG Chest Port 1 View  Result Date: 07/24/2022 CLINICAL DATA:  Sepsis evaluation EXAM: PORTABLE CHEST 1 VIEW COMPARISON:  09/28/2021, 06/23/2022 FINDINGS: Background emphysema and ill-defined bilateral pulmonary nodules are better demonstrated by comparison chest CT 06/23/2022. No superimposed acute pneumonia, collapse or consolidation. Negative for edema, effusion or pneumothorax. Normal heart size and vascularity. Trachea midline. Aorta atherosclerotic. Lower thoracic vertebral augmentation changes noted. Old rib fractures noted on the right. IMPRESSION: Stable emphysema pattern and multiple bilateral pulmonary nodules, better demonstrated by recent comparison chest CT. No superimposed acute airspace process by plain radiography. Electronically Signed   By: Judie Petit.  Shick M.D.   On: 07/24/2022 16:12    Procedures .Critical Care  Performed by: Franne Forts, DO Authorized by: Franne Forts, DO   Critical care provider statement:    Critical care time (minutes):  30   Critical care was necessary to treat or prevent imminent or life-threatening deterioration of the following conditions:  Sepsis   Critical care was time spent personally by me on the following activities:  Development of treatment plan with patient or surrogate, discussions with consultants, evaluation of patient's response to treatment, examination of patient, ordering and review of laboratory studies, ordering and review of radiographic studies, ordering and performing treatments and interventions, pulse oximetry, re-evaluation of patient's condition and review of old charts   Care discussed with: admitting provider       Medications  Ordered in ED Medications  lactated ringers infusion ( Intravenous New Bag/Given 07/24/22 1752)  sodium chloride 0.9 % bolus 1,000 mL (0 mLs Intravenous Stopped 07/24/22 1736)  cefTRIAXone (ROCEPHIN) 1 g in sodium chloride 0.9 % 100 mL IVPB (  0 g Intravenous Stopped 07/24/22 1653)  metroNIDAZOLE (FLAGYL) IVPB 500 mg (0 mg Intravenous Stopped 07/24/22 1921)  ondansetron (ZOFRAN) injection 4 mg (4 mg Intravenous Given 07/24/22 1602)  iohexol (OMNIPAQUE) 300 MG/ML solution 100 mL (60 mLs Intravenous Contrast Given 07/24/22 1700)    ED Course/ Medical Decision Making/ A&P                             Medical Decision Making Amount and/or Complexity of Data Reviewed Labs: ordered. Radiology: ordered. ECG/medicine tests: ordered.  Risk Prescription drug management. Decision regarding hospitalization.   66:34 PM 71 year old female with past medical history of COPD, hypertension hyperlipidemia, diabetes presenting for complaints of nausea and vomiting.  Patient is alert and oriented x 3, no acute distress, afebrile, stable vital signs, physical exam demonstrates soft abdomen with mild epigastric tenderness.  IV fluids and Zofran given.  Concerns for sepsis.  Blood cultures and lactic acid ordered.  Leukocytosis 27.2.  Code sepsis activated.  Patient given antibiotics for intra-abdominal coverage due to nausea and vomiting and concerns for possible UTI.  Rocephin and Flagyl started.  Reevaluation patient has improvement of nausea.  Laboratory studies interpreted by myself.  Stable liver profile, lipase, and renal function.  Alk phos elevated at 213.  UA positive for urinary tract infection. Rocephin given previously will cover. Cultures ordered.  CT abd/pelvis with contrast demonstrates: There is no evidence of intestinal obstruction or pneumoperitoneum. There is no hydronephrosis. There is small patchy infiltrate in medial segment of right middle lobe suggesting atelectasis/pneumonia. Aortic  arteriosclerosis. There is a 4.2 cm aneurysm in the infrarenal abdominal aorta. Recommend follow-up every 12 months and vascular consultation. Reference: J Am Coll Radiol 2013;10:789-794. Fatty liver. Right renal stone. Right renal cysts. Old compression fractures and kyphoplasty in lower thoracic and upper lumbar spine.  Currently I recommend admission for sepsis secondary to urinary tract infection and possibly developing pneumonia. Antibiotics added for gram positive coverage..  Patient agreeable plan.  Hospitalist provider accepted patient.         Final Clinical Impression(s) / ED Diagnoses Final diagnoses:  Acute cystitis with hematuria  Sepsis, due to unspecified organism, unspecified whether acute organ dysfunction present [facility])    Rx / DC Orders ED Discharge Orders     None         Franne Forts, DO 07/30/22 1408

## 2022-07-24 NOTE — ED Notes (Signed)
Patient transported to CT 

## 2022-07-24 NOTE — ED Notes (Signed)
Patient resting quietly in stretcher, respirations even, unlabored, no acute distress noted. Patient ambulated to restroom with walker and staff standby. Visitor at bedside. Awaiting provider disposition. Continuous fluids infusing.

## 2022-07-24 NOTE — Sepsis Progress Note (Signed)
sep

## 2022-07-24 NOTE — ED Notes (Signed)
Patient ambulatory to restroom with walker and staff assist.

## 2022-07-24 NOTE — Progress Notes (Signed)
Patient is transferred from [facility] to 5 E at 1022. Alert and oriented x 4. No pain complained at this time. Vital signs was taken and call light was within patient's reach. Paged MD admission on call at the same time and waiting for new orders.

## 2022-07-24 NOTE — Progress Notes (Signed)
   Patient Name: Allison Jimenez, corredor DOB: 1950-10-20 MRN: [id number] Transferring facility: [facility] Requesting provider: gray, DO Reason for transfer: UTI, sepsis 72 yo WF with N/V/D x 2 weeks. epigastric pain. UA +, WBC 27.2. CT abd negative acute findings.  Na 130. asked EDP to change to 0.9% for IVF. given IV Rocephin. Going to:[facility] Admission Status: obs Bed Type: med/surg To Do:  [facility] will assume care on arrival to accepting facility. Until arrival, medical decision making responsibilities remain with the EDP.  However, [facility] available 24/7 for questions and assistance.   Nursing staff please page [facility] Admits and Consults [phone]) as soon as the patient arrives to the hospital.  Carollee Herter, DO [facility]

## 2022-07-24 NOTE — ED Triage Notes (Signed)
Pt arrives to ED with c/o nausea, vomiting, diarrhea x2 weeks with associated abdominal pain.

## 2022-07-24 NOTE — H&P (Signed)
History and Physical    Allison Jimenez ZOX:[id number] DOB: [birth year] DOA: 07/24/2022  PCP: Blane Ohara, MD  Patient coming from: Home  I have personally briefly reviewed patient's old medical records in [facility]  Chief Complaint: Nausea, vomiting, abdominal pain  HPI: Allison Jimenez is a 72 y.o. female with medical history significant for COPD, HTN, HLD, depression/anxiety who presented to the ED for evaluation of nausea, vomiting, abdominal pain.  Patient reports 1 week of abdominal pain with nausea and vomiting.  She has also been having dysuria.  She denies subjective fevers.  She has been generally weak.  Denies chest pain, dyspnea.  [facility] ED Course  Labs/Imaging on admission: I have personally reviewed following labs and imaging studies.  Initial vitals showed BP 121/84, pulse 122, RR 17, temp 97.7 F, SpO2 96% on room air.  Labs showed WBC 27.2, hemoglobin 12.4, platelets 582,000, sodium 130, potassium 3.8, bicarb 24, BUN 12, creatinine 0.62, serum glucose 108, lipase 15.  SARS-CoV-2, influenza, RSV PCR negative.  UA showed negative nitrates, large leukocytes, 21-50 RBCs, >50 WBCs, rare bacteria.  Urine culture in process.  Blood cultures in process.  Lactic acid 0.9.  CT abdomen/pelvis with contrast negative for evidence of intestinal obstruction, pneumoperitoneum, or hydronephrosis.  Small patchy infiltrate in medial segment of right middle lobe noted.  4.2 cm aneurysm in the infrarenal abdominal aorta seen.  Fatty liver, right renal stone, right renal cyst, old compression fractures and kyphoplasty in the lower thoracic and upper lumbar spine also reported.  Patient was given IV ceftriaxone and Flagyl, IV Zofran, 1 L normal saline.  The hospitalist service was consulted to admit for further evaluation and management.  Review of Systems: All systems reviewed and are negative except as documented in history of present illness above.   Past Medical  History:  Diagnosis Date   Acute blood loss anemia (ABLA) 09/05/2020   Allergy    ANXIETY 06/05/2006   Arthritis    SHOULDERS    BACK PAIN 01/05/2010   Cataract    bilateral, left worse   Centrilobular emphysema (HCC)    COPD (chronic obstructive pulmonary disease) (HCC)    Depression    Diabetes mellitus without complication (HCC)    no meds, diet controllled   DIVERTICULOSIS, COLON 06/05/2006   DIZZINESS OR VERTIGO 06/05/2006   positional   Hearing loss in left ear    no hearing aid   History of blood transfusion 08/2020   History of kidney stones    passed stones   HYPERLIPIDEMIA 06/05/2006   Hypertension    HYPOKALEMIA 12/14/2008   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   Qualifier: Diagnosis of  By: Amador Cunas  MD, Janett Labella    Migraine    TOBACCO USER 11/25/2007    Past Surgical History:  Procedure Laterality Date   APPENDECTOMY     BRONCHIAL BIOPSY  09/28/2021   Procedure: BRONCHIAL BIOPSIES;  Surgeon: Omar Person, MD;  Location: [facility] ENDOSCOPY;  Service: Pulmonary;;   BRONCHIAL NEEDLE ASPIRATION BIOPSY  09/28/2021   Procedure: BRONCHIAL NEEDLE ASPIRATION BIOPSIES;  Surgeon: Omar Person, MD;  Location: [facility] ENDOSCOPY;  Service: Pulmonary;;   BRONCHIAL WASHINGS  09/28/2021   Procedure: BRONCHIAL WASHINGS;  Surgeon: Omar Person, MD;  Location: [facility] ENDOSCOPY;  Service: Pulmonary;;   CESAREAN SECTION     x1   CHOLECYSTECTOMY     COLONOSCOPY     COSMETIC SURGERY     mastoid surgery  1992   shunt in  mastoid- endolymphatic sac in left ear    MRI  07/11/2020   x several, last one located in CE   TUBAL LIGATION     UPPER GI ENDOSCOPY     vocal cord surgery     nodule x2    Social History:  reports that she has been smoking cigarettes. She has a 24.5 pack-year smoking history. She has never used smokeless tobacco. She reports that she does not drink alcohol and does not use drugs.  Allergies  Allergen Reactions   Erythromycin Base Diarrhea   Vfend  [Voriconazole]     Visual changes   Penicillins Rash    Family History  Problem Relation Age of Onset   Breast cancer Mother    Migraines Other    Depression Other    Anxiety disorder Other    High Cholesterol Other    Colon cancer Neg Hx    Rectal cancer Neg Hx    Stomach cancer Neg Hx    Colon polyps Neg Hx    Esophageal cancer Neg Hx      Prior to Admission medications   Medication Sig Start Date End Date Taking? Authorizing Provider  calcitonin, salmon, (MIACALCIN/FORTICAL) 200 UNIT/ACT nasal spray Place 1 spray into alternate nostrils daily. 07/20/22  Yes [provider]  oxyCODONE-acetaminophen (PERCOCET) 10-325 MG tablet Take 1 tablet by mouth 4 (four) times daily as needed. 06/20/22  Yes [provider]  pregabalin (LYRICA) 25 MG capsule Take 25 mg by mouth 3 (three) times daily. 07/20/22  Yes [provider]  sucralfate (CARAFATE) 1 g tablet Take 1 g by mouth 4 (four) times daily. 06/14/22  Yes [provider]  acetaminophen (TYLENOL) 500 MG tablet Take 500-1,000 mg by mouth every 6 (six) hours as needed for moderate pain.    [provider]  albuterol (VENTOLIN HFA) 108 (90 Base) MCG/ACT inhaler Inhale 2 puffs into the lungs every 6 (six) hours as needed for shortness of breath or wheezing. 05/23/22   Omar Person, MD  ALPRAZolam Prudy Feeler) 0.25 MG tablet TAKE 1 TABLET(0.25 MG) BY MOUTH THREE TIMES DAILY 03/22/22   Cox, Kirsten, MD  atorvastatin (LIPITOR) 20 MG tablet TAKE 1 TABLET(20 MG) BY MOUTH DAILY 02/05/22   Cox, Kirsten, MD  Carboxymethylcellul-Glycerin (LUBRICATING EYE DROPS OP) Place 1 drop into both eyes daily as needed (dry eyes).    [provider]  carvedilol (COREG) 25 MG tablet Take 1 tablet (25 mg total) by mouth 2 (two) times daily with a meal. 04/09/22   Cox, Kirsten, MD  cetirizine (ZYRTEC) 10 MG tablet Take 10 mg by mouth daily as needed for allergies.    [provider]  chlorthalidone (HYGROTON)  25 MG tablet Take 25 mg by mouth daily.    [provider]  conjugated estrogens (PREMARIN) vaginal cream Place 1 applicator vaginally daily as needed (dryness / burning).    [provider]  cyclobenzaprine (FLEXERIL) 10 MG tablet Take 1 tablet (10 mg total) by mouth 3 (three) times daily as needed for muscle spasms. 07/20/22   Cox, Fritzi Mandes, MD  diclofenac Sodium (VOLTAREN) 1 % GEL diclofenac 1 % topical gel Patient taking differently: Apply 1 Application topically 4 (four) times daily as needed (pain). 06/15/19   Cox, Fritzi Mandes, MD  dicyclomine (BENTYL) 20 MG tablet Take 1 tablet (20 mg total) by mouth 2 (two) times daily as needed for spasms. 04/20/21   Cox, Kirsten, MD  Erenumab-aooe (AIMOVIG) 140 MG/ML SOAJ Inject 140 mg as  directed every 30 (thirty) days.    [provider]  estradiol (ESTRACE) 0.1 MG/GM vaginal cream Place 1 Applicatorful vaginally at bedtime. Daily for 14 days then twice weekly 01/20/21   Cox, Fritzi Mandes, MD  famotidine (PEPCID) 40 MG tablet TAKE 1 TABLET(40 MG) BY MOUTH AT BEDTIME 03/07/22   Cox, Kirsten, MD  FLUoxetine (PROZAC) 20 MG capsule Take 3 capsules (60 mg total) by mouth daily. 04/09/22   Cox, Fritzi Mandes, MD  fluticasone [facility]) 50 MCG/ACT nasal spray SHAKE LIQUID AND USE 2 SPRAYS IN [facility] NOSTRIL DAILY 11/26/21   Janie Morning, NP  Fluticasone-Umeclidin-Vilant (TRELEGY ELLIPTA) 100-62.5-25 MCG/ACT AEPB Inhale 1 puff into the lungs daily. 04/25/22   Cox, Fritzi Mandes, MD  ketoconazole (NIZORAL) 2 % cream Apply 1 application. topically daily. Patient taking differently: Apply 1 application  topically daily as needed (cracked skin). 05/09/21   CoxFritzi Mandes, MD  linaclotide [facility]) 145 MCG CAPS capsule Take 1 capsule (145 mcg total) by mouth daily before breakfast. Patient taking differently: Take 145 mcg by mouth daily as needed (constipation). 05/09/21   Cox, Kirsten, MD  montelukast (SINGULAIR) 10 MG tablet TAKE 1 TABLET(10 MG) BY MOUTH AT BEDTIME 05/21/22    Cox, Kirsten, MD  Multiple Vitamins-Minerals (MULTIVITAMIN WITH IRON-MINERALS) liquid Take 15 mLs by mouth daily.    [provider]  ondansetron (ZOFRAN) 4 MG tablet TAKE 1 TABLET(4 MG) BY MOUTH EVERY 8 HOURS AS NEEDED FOR NAUSEA OR VOMITING 05/21/22   Cox, Kirsten, MD  oxyCODONE (OXY IR/ROXICODONE) 5 MG immediate release tablet Take 5 mg by mouth every 6 (six) hours as needed. 05/07/22   [provider]  pantoprazole (PROTONIX) 40 MG tablet TAKE 1 TABLET(40 MG) BY MOUTH DAILY 05/29/22   Cox, Kirsten, MD  potassium chloride (MICRO-K) 10 MEQ CR capsule Take 2 capsules (20 mEq total) by mouth 2 (two) times daily. 03/06/22   Cox, Fritzi Mandes, MD  promethazine (PHENERGAN) 25 MG tablet TAKE 1 TABLET BY MOUTH EVERY 8 HOURS IF NEEDED 07/20/22   Cox, Kirsten, MD  rizatriptan (MAXALT) 10 MG tablet Take 1 tablet (10 mg total) by mouth as needed for migraine. May repeat in 2 hours if needed 04/30/17   Drema Dallas, DO  sennosides-docusate sodium (SENOKOT-S) 8.6-50 MG tablet Take 1 tablet by mouth daily. 03/27/22   CoxFritzi Mandes, MD  vitamin B-12 (CYANOCOBALAMIN) 500 MCG tablet TAKE 1 TABLET BY MOUTH DAILY AT NOON 02/26/22   Blane Ohara, MD    Physical Exam: Vitals:   07/24/22 1930 07/24/22 2030 07/24/22 2100 07/24/22 2225  BP: (!) 159/79 136/83 (!) 141/88 (!) 149/81  Pulse: (!) 103   (!) 106  Resp: 13 17 18 18   Temp:    98.3 F (36.8 C)  TempSrc:    Oral  SpO2: 99%   100%  Weight:      Height:       Constitutional: Resting in bed, NAD, calm, comfortable Eyes: EOMI, lids and conjunctivae normal ENMT: Mucous membranes are moist. Posterior pharynx clear of any exudate or lesions.Normal dentition.  Neck: normal, supple, no masses. Respiratory: clear to auscultation bilaterally, no wheezing, no crackles. Normal respiratory effort. No accessory muscle use.  Cardiovascular: Regular rate and rhythm, no murmurs / rubs / gallops. No extremity edema. 2+ pedal pulses. Abdomen: no tenderness, no masses  palpated.  Musculoskeletal: no clubbing / cyanosis. No joint deformity upper and lower extremities. Good ROM, no contractures. Normal muscle tone.  Skin: no rashes, lesions, ulcers. No induration Neurologic:Sensation intact. Strength 5/5  in all 4.  Psychiatric: Alert and oriented x 3. Normal mood.   EKG: Personally reviewed. Sinus tachycardia, rate 123.  Rate is faster when compared to prior.  Assessment/Plan Principal Problem:   Acute cystitis without hematuria Active Problems:   Dyslipidemia   Essential hypertension   Aneurysm of infrarenal abdominal aorta (HCC)   COPD (chronic obstructive pulmonary disease) (HCC)   Hyponatremia   GAD (generalized anxiety disorder)   Allison Jimenez is a 72 y.o. female with medical history significant for COPD, HTN, HLD, depression/anxiety who is admitted with UTI.  Assessment and Plan: Urinary tract infection: -Continue IV ceftriaxone -Follow urine and blood cultures culture  Hyponatremia: Mild with sodium 130 likely due to volume depletion from GI losses.  Continue IV normal saline hydration overnight.  COPD: Stable.  Continue Trelegy Ellipta, Singulair, albuterol as needed.  Hypertension: Continue Coreg.  Hyperlipidemia: Continue statin.  Depression/anxiety: Continue Prozac and Xanax as needed.  Infrarenal abdominal aortic aneurysm: 4.2 cm in size seen on CT imaging.  Follow-up every 12 months recommended.   DVT prophylaxis: enoxaparin (LOVENOX) injection 40 mg Start: 07/25/22 1000 Code Status: DNR, confirmed with patient on admission Family Communication: Discussed with patient, she has discussed with family Disposition Plan: From home and likely discharge to home in 24-48 hours Consults called: None Severity of Illness: The appropriate patient status for this patient is OBSERVATION. Observation status is judged to be reasonable and necessary in order to provide the required intensity of service to ensure the patient's safety.  The patient's presenting symptoms, physical exam findings, and initial radiographic and laboratory data in the context of their medical condition is felt to place them at decreased risk for further clinical deterioration. Furthermore, it is anticipated that the patient will be medically stable for discharge from the hospital within 2 midnights of admission.   Darreld Mclean MD [facility]  If 7PM-7AM, please contact night-coverage [email or web address]  07/24/2022, 11:39 PM

## 2022-07-24 NOTE — ED Notes (Signed)
Report called to nurse on accepting unit, [facility] here for patient transport.

## 2022-07-24 NOTE — Sepsis Progress Note (Signed)
[facility] monitoring for the code sepsis protocol.  

## 2022-07-24 NOTE — Hospital Course (Signed)
Allison Jimenez is a 72 y.o. female with medical history significant for COPD, HTN, HLD, depression/anxiety who is admitted with UTI.

## 2022-07-25 DIAGNOSIS — F17200 Nicotine dependence, unspecified, uncomplicated: Secondary | ICD-10-CM | POA: Diagnosis not present

## 2022-07-25 DIAGNOSIS — E876 Hypokalemia: Secondary | ICD-10-CM | POA: Diagnosis present

## 2022-07-25 DIAGNOSIS — R296 Repeated falls: Secondary | ICD-10-CM | POA: Diagnosis present

## 2022-07-25 DIAGNOSIS — M85832 Other specified disorders of bone density and structure, left forearm: Secondary | ICD-10-CM | POA: Diagnosis not present

## 2022-07-25 DIAGNOSIS — J189 Pneumonia, unspecified organism: Secondary | ICD-10-CM | POA: Diagnosis present

## 2022-07-25 DIAGNOSIS — I7143 Infrarenal abdominal aortic aneurysm, without rupture: Secondary | ICD-10-CM

## 2022-07-25 DIAGNOSIS — E119 Type 2 diabetes mellitus without complications: Secondary | ICD-10-CM | POA: Diagnosis present

## 2022-07-25 DIAGNOSIS — N201 Calculus of ureter: Secondary | ICD-10-CM | POA: Diagnosis not present

## 2022-07-25 DIAGNOSIS — F17210 Nicotine dependence, cigarettes, uncomplicated: Secondary | ICD-10-CM | POA: Diagnosis not present

## 2022-07-25 DIAGNOSIS — Y92009 Unspecified place in unspecified non-institutional (private) residence as the place of occurrence of the external cause: Secondary | ICD-10-CM | POA: Diagnosis not present

## 2022-07-25 DIAGNOSIS — M85862 Other specified disorders of bone density and structure, left lower leg: Secondary | ICD-10-CM | POA: Diagnosis not present

## 2022-07-25 DIAGNOSIS — Z66 Do not resuscitate: Secondary | ICD-10-CM | POA: Diagnosis present

## 2022-07-25 DIAGNOSIS — S0990XA Unspecified injury of head, initial encounter: Secondary | ICD-10-CM | POA: Diagnosis not present

## 2022-07-25 DIAGNOSIS — I10 Essential (primary) hypertension: Secondary | ICD-10-CM | POA: Diagnosis present

## 2022-07-25 DIAGNOSIS — L89621 Pressure ulcer of left heel, stage 1: Secondary | ICD-10-CM | POA: Diagnosis present

## 2022-07-25 DIAGNOSIS — N390 Urinary tract infection, site not specified: Secondary | ICD-10-CM | POA: Diagnosis not present

## 2022-07-25 DIAGNOSIS — M85831 Other specified disorders of bone density and structure, right forearm: Secondary | ICD-10-CM | POA: Diagnosis not present

## 2022-07-25 DIAGNOSIS — G9341 Metabolic encephalopathy: Secondary | ICD-10-CM | POA: Diagnosis present

## 2022-07-25 DIAGNOSIS — A419 Sepsis, unspecified organism: Secondary | ICD-10-CM | POA: Diagnosis present

## 2022-07-25 DIAGNOSIS — E785 Hyperlipidemia, unspecified: Secondary | ICD-10-CM | POA: Diagnosis present

## 2022-07-25 DIAGNOSIS — S51811A Laceration without foreign body of right forearm, initial encounter: Secondary | ICD-10-CM | POA: Diagnosis present

## 2022-07-25 DIAGNOSIS — M4312 Spondylolisthesis, cervical region: Secondary | ICD-10-CM | POA: Diagnosis not present

## 2022-07-25 DIAGNOSIS — W19XXXA Unspecified fall, initial encounter: Secondary | ICD-10-CM | POA: Diagnosis not present

## 2022-07-25 DIAGNOSIS — N3000 Acute cystitis without hematuria: Secondary | ICD-10-CM | POA: Diagnosis not present

## 2022-07-25 DIAGNOSIS — J449 Chronic obstructive pulmonary disease, unspecified: Secondary | ICD-10-CM | POA: Diagnosis not present

## 2022-07-25 DIAGNOSIS — S199XXA Unspecified injury of neck, initial encounter: Secondary | ICD-10-CM | POA: Diagnosis not present

## 2022-07-25 DIAGNOSIS — J432 Centrilobular emphysema: Secondary | ICD-10-CM | POA: Diagnosis present

## 2022-07-25 DIAGNOSIS — M47812 Spondylosis without myelopathy or radiculopathy, cervical region: Secondary | ICD-10-CM | POA: Diagnosis not present

## 2022-07-25 DIAGNOSIS — M85841 Other specified disorders of bone density and structure, right hand: Secondary | ICD-10-CM | POA: Diagnosis not present

## 2022-07-25 DIAGNOSIS — E039 Hypothyroidism, unspecified: Secondary | ICD-10-CM | POA: Diagnosis present

## 2022-07-25 DIAGNOSIS — M79631 Pain in right forearm: Secondary | ICD-10-CM | POA: Diagnosis not present

## 2022-07-25 DIAGNOSIS — R4182 Altered mental status, unspecified: Secondary | ICD-10-CM | POA: Diagnosis not present

## 2022-07-25 DIAGNOSIS — M79662 Pain in left lower leg: Secondary | ICD-10-CM | POA: Diagnosis not present

## 2022-07-25 DIAGNOSIS — Z1152 Encounter for screening for COVID-19: Secondary | ICD-10-CM | POA: Diagnosis not present

## 2022-07-25 DIAGNOSIS — S0083XA Contusion of other part of head, initial encounter: Secondary | ICD-10-CM | POA: Diagnosis present

## 2022-07-25 DIAGNOSIS — M8580 Other specified disorders of bone density and structure, unspecified site: Secondary | ICD-10-CM | POA: Diagnosis not present

## 2022-07-25 DIAGNOSIS — J440 Chronic obstructive pulmonary disease with acute lower respiratory infection: Secondary | ICD-10-CM | POA: Diagnosis present

## 2022-07-25 DIAGNOSIS — E871 Hypo-osmolality and hyponatremia: Secondary | ICD-10-CM | POA: Diagnosis present

## 2022-07-25 DIAGNOSIS — H9192 Unspecified hearing loss, left ear: Secondary | ICD-10-CM | POA: Diagnosis present

## 2022-07-25 DIAGNOSIS — B9689 Other specified bacterial agents as the cause of diseases classified elsewhere: Secondary | ICD-10-CM | POA: Diagnosis not present

## 2022-07-25 DIAGNOSIS — N139 Obstructive and reflux uropathy, unspecified: Secondary | ICD-10-CM | POA: Diagnosis present

## 2022-07-25 DIAGNOSIS — R102 Pelvic and perineal pain: Secondary | ICD-10-CM | POA: Diagnosis not present

## 2022-07-25 DIAGNOSIS — K760 Fatty (change of) liver, not elsewhere classified: Secondary | ICD-10-CM | POA: Diagnosis present

## 2022-07-25 DIAGNOSIS — N136 Pyonephrosis: Secondary | ICD-10-CM | POA: Diagnosis present

## 2022-07-25 DIAGNOSIS — W01190A Fall on same level from slipping, tripping and stumbling with subsequent striking against furniture, initial encounter: Secondary | ICD-10-CM | POA: Diagnosis present

## 2022-07-25 DIAGNOSIS — K5730 Diverticulosis of large intestine without perforation or abscess without bleeding: Secondary | ICD-10-CM | POA: Diagnosis not present

## 2022-07-25 DIAGNOSIS — Y92013 Bedroom of single-family (private) house as the place of occurrence of the external cause: Secondary | ICD-10-CM | POA: Diagnosis not present

## 2022-07-25 DIAGNOSIS — M79632 Pain in left forearm: Secondary | ICD-10-CM | POA: Diagnosis not present

## 2022-07-25 DIAGNOSIS — F32A Depression, unspecified: Secondary | ICD-10-CM | POA: Diagnosis present

## 2022-07-25 DIAGNOSIS — E860 Dehydration: Secondary | ICD-10-CM | POA: Diagnosis present

## 2022-07-25 DIAGNOSIS — R109 Unspecified abdominal pain: Secondary | ICD-10-CM | POA: Diagnosis not present

## 2022-07-25 DIAGNOSIS — M79641 Pain in right hand: Secondary | ICD-10-CM | POA: Diagnosis not present

## 2022-07-25 LAB — CULTURE, BLOOD (ROUTINE X 2): Special Requests: ADEQUATE

## 2022-07-25 LAB — BLOOD CULTURE ID PANEL (REFLEXED) - BCID2

## 2022-07-25 LAB — BASIC METABOLIC PANEL
Anion gap: 11 (ref 5–15)
BUN: 8 mg/dL (ref 8–23)
CO2: 18 mmol/L — ABNORMAL LOW (ref 22–32)
Calcium: 7.4 mg/dL — ABNORMAL LOW (ref 8.9–10.3)
Chloride: 102 mmol/L (ref 98–111)
Creatinine, Ser: 0.44 mg/dL (ref 0.44–1.00)
GFR, Estimated: >60 mL/min (ref >60)
Glucose, Bld: 76 mg/dL (ref 70–99)
Potassium: 2.9 mmol/L — ABNORMAL LOW (ref 3.5–5.1)
Sodium: 131 mmol/L — ABNORMAL LOW (ref 135–145)

## 2022-07-25 LAB — CBC WITH DIFFERENTIAL/PLATELET
Abs Immature Granulocytes: 0.1 10*3/uL — ABNORMAL HIGH (ref 0.00–0.07)
Basophils Absolute: 0 10*3/uL (ref 0.0–0.1)
Basophils Relative: 0 %
Eosinophils Absolute: 0.1 10*3/uL (ref 0.0–0.5)
Eosinophils Relative: 1 %
HCT: 30.3 % — ABNORMAL LOW (ref 36.0–46.0)
Hemoglobin: 9.7 g/dL — ABNORMAL LOW (ref 12.0–15.0)
Immature Granulocytes: 1 %
Lymphocytes Relative: 25 %
Lymphs Abs: 3.1 10*3/uL (ref 0.7–4.0)
MCH: 29.2 pg (ref 26.0–34.0)
MCHC: 32 g/dL (ref 30.0–36.0)
MCV: 91.3 fL (ref 80.0–100.0)
Monocytes Absolute: 1.2 10*3/uL — ABNORMAL HIGH (ref 0.1–1.0)
Monocytes Relative: 10 %
Neutro Abs: 7.7 10*3/uL (ref 1.7–7.7)
Neutrophils Relative %: 63 %
Platelets: 449 10*3/uL — ABNORMAL HIGH (ref 150–400)
RBC: 3.32 MIL/uL — ABNORMAL LOW (ref 3.87–5.11)
RDW: 15 % (ref 11.5–15.5)
WBC: 12.2 10*3/uL — ABNORMAL HIGH (ref 4.0–10.5)
nRBC: 0 % (ref 0.0–0.2)

## 2022-07-25 LAB — URINE CULTURE

## 2022-07-25 LAB — HIV ANTIBODY (ROUTINE TESTING W REFLEX): HIV Screen 4th Generation wRfx: NONREACTIVE

## 2022-07-25 MED ORDER — KETOCONAZOLE 2 % EX CREA: 1.0000 | TOPICAL_CREAM | Freq: Every day | CUTANEOUS | Status: DC | PRN

## 2022-07-25 MED ORDER — CEPHALEXIN 500 MG PO CAPS: 500.0000 mg | ORAL_CAPSULE | Freq: Three times a day (TID) | ORAL | 0 refills | Status: DC

## 2022-07-25 MED ORDER — LINACLOTIDE 145 MCG PO CAPS: 145.0000 ug | ORAL_CAPSULE | Freq: Every day | ORAL | Status: DC | PRN

## 2022-07-25 MED ORDER — POTASSIUM CHLORIDE CRYS ER 20 MEQ PO TBCR
40.0000 meq | EXTENDED_RELEASE_TABLET | ORAL | Status: AC
  Administered 2022-07-25 (×2): 40 meq via ORAL
  Filled 2022-07-25 (×2): qty 2

## 2022-07-25 MED ORDER — DICLOFENAC SODIUM 1 % EX GEL: 1.0000 | Freq: Four times a day (QID) | CUTANEOUS | Status: AC | PRN

## 2022-07-25 NOTE — Progress Notes (Signed)
   07/25/22 1029  TOC Brief Assessment  Insurance and Status Reviewed  Patient has primary care physician Yes  Home environment has been reviewed Home alone  Prior level of function: Independent  Prior/Current Home Services No current home services  Social Determinants of Health Reivew SDOH reviewed no interventions necessary  Readmission risk has been reviewed Yes  Transition of care needs no transition of care needs at this time

## 2022-07-25 NOTE — Progress Notes (Signed)
Patient provided with discharge education, patient verbalized understanding. IV removed.  

## 2022-07-25 NOTE — Discharge Summary (Signed)
Physician Discharge Summary  Allison Jimenez VWU:[id number] DOB: 11-29-1950 DOA: 07/24/2022  PCP: Blane Ohara, MD  Admit date: 07/24/2022 Discharge date: 07/25/2022  Admitted From: Home Disposition: Home  Recommendations for Outpatient Follow-up:  Follow up with PCP in 1 week with repeat CBC/BMP Follow up in ED if symptoms worsen or new appear   Home Health: No Equipment/Devices: None  Discharge Condition: Stable CODE STATUS: DNR Diet recommendation: Heart healthy  Brief/Interim Summary:  72 y.o. female with medical history significant for COPD, HTN, HLD, depression/anxiety presented with nausea, vomiting and abdominal pain.  On presented, WBC was 27.2, lipase of 15; COVID-19/influenza/RSV PCR negative; UA suggestive of UTI and CT of abdomen/pelvis showed no evidence of intestinal obstruction/pneumoperitoneum or hydronephrosis.  She was started on IV antibiotics and fluids.  During the hospitalization, she has felt much better and she wants to go home today.  She is actually adamant about it.  Blood and urine cultures are still pending.  She is afebrile and WBC is significantly improved.  She will be discharged home today on oral Keflex.  Discharge Diagnoses:   UTI: Present on admission -Imaging as above.  Blood and urine cultures pending.  Currently on Rocephin. -During the hospitalization, she has felt much better and she wants to go home today.  She is actually adamant about it.   - She is afebrile and WBC is significantly improved.  She will be discharged home today on oral Keflex.  Leukocytosis -WBCs 27.2 on presentation: Significantly improved to 12.2 today.  Outpatient follow-up  Hyponatremia -Mild.  Improving  Hypokalemia -Replace prior to discharge.  Continue replacement as an outpatient.  Outpatient follow-up of BMP  Acute metabolic acidosis -Mild.  Continue oral intake.  Outpatient follow-up.  COPD -Stable.  Continue home regimen of Trelegy Ellipta, Singulair and  albuterol as needed  Hypertension -Continue Coreg  Hyperlipidemia -Continue statin  Depression/anxiety -Continue Prozac  Infrarenal abdominal aortic aneurysm -4.2 cm in size seen on CT imaging. Follow-up every 12 months recommended.   Discharge Instructions  Discharge Instructions     Diet - low sodium heart healthy   Complete by: As directed    Increase activity slowly   Complete by: As directed       Allergies as of 07/25/2022       Reactions   Erythromycin Base Diarrhea   Vfend [voriconazole]    Visual changes   Penicillins Rash        Medication List     STOP taking these medications    ALPRAZolam 0.25 MG tablet Commonly known as: XANAX   chlorthalidone 25 MG tablet Commonly known as: HYGROTON   ondansetron 4 MG tablet Commonly known as: ZOFRAN   oxyCODONE 5 MG immediate release tablet Commonly known as: Oxy IR/ROXICODONE       TAKE these medications    acetaminophen 500 MG tablet Commonly known as: TYLENOL Take 500-1,000 mg by mouth every 6 (six) hours as needed for moderate pain.   Aimovig 140 MG/ML Soaj Generic drug: Erenumab-aooe Inject 140 mg as directed every 30 (thirty) days.   albuterol 108 (90 Base) MCG/ACT inhaler Commonly known as: VENTOLIN HFA Inhale 2 puffs into the lungs every 6 (six) hours as needed for shortness of breath or wheezing.   atorvastatin 20 MG tablet Commonly known as: LIPITOR TAKE 1 TABLET(20 MG) BY MOUTH DAILY   calcitonin (salmon) 200 UNIT/ACT nasal spray Commonly known as: MIACALCIN/FORTICAL Place 1 spray into alternate nostrils daily.   carvedilol 25 MG tablet Commonly  known as: COREG Take 1 tablet (25 mg total) by mouth 2 (two) times daily with a meal.   cephALEXin 500 MG capsule Commonly known as: KEFLEX Take 1 capsule (500 mg total) by mouth 3 (three) times daily for 5 days. Start taking on: July 26, 2022   cetirizine 10 MG tablet Commonly known as: ZYRTEC Take 10 mg by mouth daily as  needed for allergies.   conjugated estrogens vaginal cream Commonly known as: PREMARIN Place 1 applicator vaginally daily as needed (dryness / burning).   cyclobenzaprine 10 MG tablet Commonly known as: FLEXERIL Take 1 tablet (10 mg total) by mouth 3 (three) times daily as needed for muscle spasms.   diclofenac Sodium 1 % Gel Commonly known as: VOLTAREN Apply 1 Application topically 4 (four) times daily as needed (pain).   dicyclomine 20 MG tablet Commonly known as: BENTYL Take 1 tablet (20 mg total) by mouth 2 (two) times daily as needed for spasms.   estradiol 0.1 MG/GM vaginal cream Commonly known as: ESTRACE Place 1 Applicatorful vaginally at bedtime. Daily for 14 days then twice weekly   famotidine 40 MG tablet Commonly known as: PEPCID TAKE 1 TABLET(40 MG) BY MOUTH AT BEDTIME   FLUoxetine 20 MG capsule Commonly known as: PROZAC Take 3 capsules (60 mg total) by mouth daily.   fluticasone 50 MCG/ACT nasal spray Commonly known as: FLONASE SHAKE LIQUID AND USE 2 SPRAYS IN EACH NOSTRIL DAILY   ketoconazole 2 % cream Commonly known as: NIZORAL Apply 1 Application topically daily as needed (cracked skin).   linaclotide 145 MCG Caps capsule Commonly known as: Linzess Take 1 capsule (145 mcg total) by mouth daily as needed (constipation).   LUBRICATING EYE DROPS OP Place 1 drop into both eyes daily as needed (dry eyes).   montelukast 10 MG tablet Commonly known as: SINGULAIR TAKE 1 TABLET(10 MG) BY MOUTH AT BEDTIME   multivitamin with iron-minerals liquid Take 15 mLs by mouth daily.   oxyCODONE-acetaminophen 10-325 MG tablet Commonly known as: PERCOCET Take 1 tablet by mouth 4 (four) times daily as needed.   pantoprazole 40 MG tablet Commonly known as: PROTONIX TAKE 1 TABLET(40 MG) BY MOUTH DAILY   potassium chloride 10 MEQ CR capsule Commonly known as: MICRO-K Take 2 capsules (20 mEq total) by mouth 2 (two) times daily.   pregabalin 25 MG  capsule Commonly known as: LYRICA Take 25 mg by mouth 3 (three) times daily.   promethazine 25 MG tablet Commonly known as: PHENERGAN TAKE 1 TABLET BY MOUTH EVERY 8 HOURS IF NEEDED   rizatriptan 10 MG tablet Commonly known as: Maxalt Take 1 tablet (10 mg total) by mouth as needed for migraine. May repeat in 2 hours if needed   sennosides-docusate sodium 8.6-50 MG tablet Commonly known as: SENOKOT-S Take 1 tablet by mouth daily.   sucralfate 1 g tablet Commonly known as: CARAFATE Take 1 g by mouth 4 (four) times daily.   Trelegy Ellipta 100-62.5-25 MCG/ACT Aepb Generic drug: Fluticasone-Umeclidin-Vilant Inhale 1 puff into the lungs daily.   vitamin B-12 500 MCG tablet Commonly known as: CYANOCOBALAMIN TAKE 1 TABLET BY MOUTH DAILY AT NOON        Follow-up Information     Cox, Kirsten, MD. Schedule an appointment as soon as possible for a visit in 1 week(s).   Specialty: Family Medicine Why: with repeat CBC/BMP Contact information: [address] [place] [address] [phone]  Allergies  Allergen Reactions   Erythromycin Base Diarrhea   Vfend [Voriconazole]     Visual changes   Penicillins Rash    Consultations: None   Procedures/Studies: CT ABDOMEN PELVIS W CONTRAST  Result Date: 07/24/2022 CLINICAL DATA:  Epigastric pain, nausea, vomiting EXAM: CT ABDOMEN AND PELVIS WITH CONTRAST TECHNIQUE: Multidetector CT imaging of the abdomen and pelvis was performed using the standard protocol following bolus administration of intravenous contrast. RADIATION DOSE REDUCTION: This exam was performed according to the departmental dose-optimization program which includes automated exposure control, adjustment of the mA and/or kV according to patient size and/or use of iterative reconstruction technique. CONTRAST:  60mL OMNIPAQUE IOHEXOL 300 MG/ML  SOLN COMPARISON:  07/10/2021 FINDINGS: Lower chest: Small patchy infiltrates are seen in the  medial segment of right middle lobe. There are small linear densities in the posterior lower lung fields. Hepatobiliary: There is fatty infiltration in liver. Surgical clips are seen in gallbladder fossa. There is no dilation of bile ducts. There is decreased density in liver close to falciform ligament, possibly due to aberrant venous drainage or more pronounced fatty infiltration. Pancreas: No focal abnormalities are seen. Spleen: Unremarkable. Adrenals/Urinary Tract: Adrenals are unremarkable. There is no hydronephrosis. There is 4 mm calculus in the upper pole of right kidney. Possible subcentimeter right renal cyst. Ureters are not dilated. Urinary bladder is not distended. Stomach/Bowel: Stomach is not distended. Small bowel loops are not dilated. Appendix is not distinctly seen. There is no pericecal inflammation. There is no significant wall thickening in colon. Colon is not distended. There is no pericolic stranding or fluid collection. Vascular/Lymphatic: There is 4.2 cm infrarenal aortic aneurysm. Atherosclerotic plaques and calcifications are seen in the aorta and its major branches. Reproductive: Uterus appears smaller than usual in size. No adnexal masses are seen. Other: There is no ascites or pneumoperitoneum. There is small ventral hernia in suprapubic region. Musculoskeletal: There are old compression fractures in the bodies of T10, T11, T12 and L1 vertebrae along with kyphoplasty. IMPRESSION: There is no evidence of intestinal obstruction or pneumoperitoneum. There is no hydronephrosis. There is small patchy infiltrate in medial segment of right middle lobe suggesting atelectasis/pneumonia. Aortic arteriosclerosis. There is a 4.2 cm aneurysm in the infrarenal abdominal aorta. Recommend follow-up every 12 months and vascular consultation. Reference: J Am Coll Radiol 2013;10:789-794. Fatty liver. Right renal stone. Right renal cysts. Old compression fractures and kyphoplasty in lower thoracic and  upper lumbar spine. Electronically Signed   By: Ernie Avena M.D.   On: 07/24/2022 17:56   DG Chest Port 1 View  Result Date: 07/24/2022 CLINICAL DATA:  Sepsis evaluation EXAM: PORTABLE CHEST 1 VIEW COMPARISON:  09/28/2021, 06/23/2022 FINDINGS: Background emphysema and ill-defined bilateral pulmonary nodules are better demonstrated by comparison chest CT 06/23/2022. No superimposed acute pneumonia, collapse or consolidation. Negative for edema, effusion or pneumothorax. Normal heart size and vascularity. Trachea midline. Aorta atherosclerotic. Lower thoracic vertebral augmentation changes noted. Old rib fractures noted on the right. IMPRESSION: Stable emphysema pattern and multiple bilateral pulmonary nodules, better demonstrated by recent comparison chest CT. No superimposed acute airspace process by plain radiography. Electronically Signed   By: Judie Petit.  Shick M.D.   On: 07/24/2022 16:12      Subjective: Patient seen and examined at bedside.  Adamant about going home today.  Feels much better and denies any current vomiting although still having nausea.  Abdominal pain is improving.  Tolerating diet.  No fever or worsening shortness of breath reported.  Discharge Exam: Vitals:  07/25/22 0555 07/25/22 0819  BP: (!) 148/72   Pulse:    Resp: 17   Temp: 98.3 F (36.8 C)   SpO2: 98% 96%    General: Pt is alert, awake, not in acute distress.  Looks chronically ill and deconditioned.  Very thinly built.  On room air. Cardiovascular: rate controlled, S1/S2 + Respiratory: bilateral decreased breath sounds at bases Abdominal: Soft, NT, ND, bowel sounds + Extremities: no edema, no cyanosis    The results of significant diagnostics from this hospitalization (including imaging, microbiology, ancillary and laboratory) are listed below for reference.     Microbiology: Recent Results (from the past 240 hour(s))  Resp panel by RT-PCR (RSV, Flu A&B, Covid) Anterior Nasal Swab     Status: None    Collection Time: 07/24/22  3:36 PM   Specimen: Anterior Nasal Swab  Result Value Ref Range Status   SARS Coronavirus 2 by RT PCR NEGATIVE NEGATIVE Final    Comment: (NOTE) SARS-CoV-2 target nucleic acids are NOT DETECTED.  The SARS-CoV-2 RNA is generally detectable in upper respiratory specimens during the acute phase of infection. The lowest concentration of SARS-CoV-2 viral copies this assay can detect is 138 copies/mL. A negative result does not preclude SARS-Cov-2 infection and should not be used as the sole basis for treatment or other patient management decisions. A negative result may occur with  improper specimen collection/handling, submission of specimen other than nasopharyngeal swab, presence of viral mutation(s) within the areas targeted by this assay, and inadequate number of viral copies(<138 copies/mL). A negative result must be combined with clinical observations, patient history, and epidemiological information. The expected result is Negative.  Fact Sheet for Patients:  BloggerCourse.com  Fact Sheet for Healthcare Providers:  SeriousBroker.it  This test is no t yet approved or cleared by the [place] FDA and  has been authorized for detection and/or diagnosis of SARS-CoV-2 by FDA under an Emergency Use Authorization (EUA). This EUA will remain  in effect (meaning this test can be used) for the duration of the COVID-19 declaration under Section 564(b)(1) of the Act, 21 U.S.C.section 360bbb-3(b)(1), unless the authorization is terminated  or revoked sooner.       Influenza A by PCR NEGATIVE NEGATIVE Final   Influenza B by PCR NEGATIVE NEGATIVE Final    Comment: (NOTE) The Xpert Xpress SARS-CoV-2/FLU/RSV plus assay is intended as an aid in the diagnosis of influenza from Nasopharyngeal swab specimens and should not be used as a sole basis for treatment. Nasal washings and aspirates are unacceptable for  Xpert Xpress SARS-CoV-2/FLU/RSV testing.  Fact Sheet for Patients: BloggerCourse.com  Fact Sheet for Healthcare Providers: SeriousBroker.it  This test is not yet approved or cleared by the [place] FDA and has been authorized for detection and/or diagnosis of SARS-CoV-2 by FDA under an Emergency Use Authorization (EUA). This EUA will remain in effect (meaning this test can be used) for the duration of the COVID-19 declaration under Section 564(b)(1) of the Act, 21 U.S.C. section 360bbb-3(b)(1), unless the authorization is terminated or revoked.     Resp Syncytial Virus by PCR NEGATIVE NEGATIVE Final    Comment: (NOTE) Fact Sheet for Patients: BloggerCourse.com  Fact Sheet for Healthcare Providers: SeriousBroker.it  This test is not yet approved or cleared by the [place] FDA and has been authorized for detection and/or diagnosis of SARS-CoV-2 by FDA under an Emergency Use Authorization (EUA). This EUA will remain in effect (meaning this test can be used) for the duration of the COVID-19  declaration under Section 564(b)(1) of the Act, 21 U.S.C. section 360bbb-3(b)(1), unless the authorization is terminated or revoked.  Performed at [facility], [address], [place], [place] [address]   Blood Culture (routine x 2)     Status: None (Preliminary result)   Collection Time: 07/24/22  3:58 PM   Specimen: BLOOD  Result Value Ref Range Status   Specimen Description   Final    BLOOD RIGHT ANTECUBITAL Performed at [facility], [address], [place], [place] [address]    Special Requests   Final    BOTTLES DRAWN AEROBIC AND ANAEROBIC Blood Culture adequate volume Performed at [facility], [address], [place], [place] [address]    Culture   Final    NO GROWTH < 24 HOURS Performed at [facility], [address], [place], [place] [address]    Report Status PENDING  Incomplete     Labs: BNP (last 3 results) No results for input(s): "BNP" in the last 8760 hours. Basic Metabolic Panel: Recent Labs  Lab 07/24/22 1519 07/25/22 0554  NA 130* 131*  K 3.8 2.9*  CL 96* 102  CO2 24 18*  GLUCOSE 108* 76  BUN 12 8  CREATININE 0.62 0.44  CALCIUM 8.3* 7.4*   Liver Function Tests: Recent Labs  Lab 07/24/22 1519  AST 16  ALT 12  ALKPHOS 213*  BILITOT 0.5  PROT 6.0*  ALBUMIN 2.9*   Recent Labs  Lab 07/24/22 1519  LIPASE 15   No results for input(s): "AMMONIA" in the last 168 hours. CBC: Recent Labs  Lab 07/24/22 1519 07/25/22 0554  WBC 27.2* 12.2*  NEUTROABS  --  7.7  HGB 12.4 9.7*  HCT 38.1 30.3*  MCV 89.6 91.3  PLT 582* 449*   Cardiac Enzymes: No results for input(s): "CKTOTAL", "CKMB", "CKMBINDEX", "TROPONINI" in the last 168 hours. BNP: Invalid input(s): "POCBNP" CBG: No results for input(s): "GLUCAP" in the last 168 hours. D-Dimer No results for input(s): "DDIMER" in the last 72 hours. Hgb A1c No results for input(s): "HGBA1C" in the last 72 hours. Lipid Profile No results for input(s): "CHOL", "HDL", "LDLCALC", "TRIG", "CHOLHDL", "LDLDIRECT" in the last 72 hours. Thyroid function studies No results for input(s): "TSH", "T4TOTAL", "T3FREE", "THYROIDAB" in the last 72 hours.  Invalid input(s): "FREET3" Anemia work up No results for input(s): "VITAMINB12", "FOLATE", "FERRITIN", "TIBC", "IRON", "RETICCTPCT" in the last 72 hours. Urinalysis    Component Value Date/Time   COLORURINE YELLOW 07/24/2022 1545   APPEARANCEUR CLOUDY (A) 07/24/2022 1545   LABSPEC 1.030 07/24/2022 1545   PHURINE 6.0 07/24/2022 1545   GLUCOSEU NEGATIVE 07/24/2022 1545   GLUCOSEU NEGATIVE 05/11/2010 0756   HGBUR SMALL (A) 07/24/2022 1545   BILIRUBINUR NEGATIVE 07/24/2022 1545   BILIRUBINUR negative 03/27/2022 1204   BILIRUBINUR negative 01/19/2021 1031   KETONESUR TRACE  (A) 07/24/2022 1545   PROTEINUR >300 (A) 07/24/2022 1545   UROBILINOGEN 0.2 03/27/2022 1204   UROBILINOGEN 0.2 05/11/2010 0756   NITRITE NEGATIVE 07/24/2022 1545   LEUKOCYTESUR LARGE (A) 07/24/2022 1545   Sepsis Labs Recent Labs  Lab 07/24/22 1519 07/25/22 0554  WBC 27.2* 12.2*   Microbiology Recent Results (from the past 240 hour(s))  Resp panel by RT-PCR (RSV, Flu A&B, Covid) Anterior Nasal Swab     Status: None   Collection Time: 07/24/22  3:36 PM   Specimen: Anterior Nasal Swab  Result Value Ref Range Status   SARS Coronavirus 2 by RT PCR NEGATIVE NEGATIVE  Final    Comment: (NOTE) SARS-CoV-2 target nucleic acids are NOT DETECTED.  The SARS-CoV-2 RNA is generally detectable in upper respiratory specimens during the acute phase of infection. The lowest concentration of SARS-CoV-2 viral copies this assay can detect is 138 copies/mL. A negative result does not preclude SARS-Cov-2 infection and should not be used as the sole basis for treatment or other patient management decisions. A negative result may occur with  improper specimen collection/handling, submission of specimen other than nasopharyngeal swab, presence of viral mutation(s) within the areas targeted by this assay, and inadequate number of viral copies(<138 copies/mL). A negative result must be combined with clinical observations, patient history, and epidemiological information. The expected result is Negative.  Fact Sheet for Patients:  BloggerCourse.com  Fact Sheet for Healthcare Providers:  SeriousBroker.it  This test is no t yet approved or cleared by the [place] FDA and  has been authorized for detection and/or diagnosis of SARS-CoV-2 by FDA under an Emergency Use Authorization (EUA). This EUA will remain  in effect (meaning this test can be used) for the duration of the COVID-19 declaration under Section 564(b)(1) of the Act, 21 U.S.C.section  360bbb-3(b)(1), unless the authorization is terminated  or revoked sooner.       Influenza A by PCR NEGATIVE NEGATIVE Final   Influenza B by PCR NEGATIVE NEGATIVE Final    Comment: (NOTE) The Xpert Xpress SARS-CoV-2/FLU/RSV plus assay is intended as an aid in the diagnosis of influenza from Nasopharyngeal swab specimens and should not be used as a sole basis for treatment. Nasal washings and aspirates are unacceptable for Xpert Xpress SARS-CoV-2/FLU/RSV testing.  Fact Sheet for Patients: BloggerCourse.com  Fact Sheet for Healthcare Providers: SeriousBroker.it  This test is not yet approved or cleared by the [place] FDA and has been authorized for detection and/or diagnosis of SARS-CoV-2 by FDA under an Emergency Use Authorization (EUA). This EUA will remain in effect (meaning this test can be used) for the duration of the COVID-19 declaration under Section 564(b)(1) of the Act, 21 U.S.C. section 360bbb-3(b)(1), unless the authorization is terminated or revoked.     Resp Syncytial Virus by PCR NEGATIVE NEGATIVE Final    Comment: (NOTE) Fact Sheet for Patients: BloggerCourse.com  Fact Sheet for Healthcare Providers: SeriousBroker.it  This test is not yet approved or cleared by the [place] FDA and has been authorized for detection and/or diagnosis of SARS-CoV-2 by FDA under an Emergency Use Authorization (EUA). This EUA will remain in effect (meaning this test can be used) for the duration of the COVID-19 declaration under Section 564(b)(1) of the Act, 21 U.S.C. section 360bbb-3(b)(1), unless the authorization is terminated or revoked.  Performed at [facility], [address] [place], [place] [address]   Blood Culture (routine x 2)     Status: None (Preliminary result)   Collection Time: 07/24/22  3:58 PM   Specimen: BLOOD  Result Value  Ref Range Status   Specimen Description   Final    BLOOD RIGHT ANTECUBITAL Performed at [facility], [address], [place], [place] [address]    Special Requests   Final    BOTTLES DRAWN AEROBIC AND ANAEROBIC Blood Culture adequate volume Performed at [facility], [address] [place], [place] [address]    Culture   Final    NO GROWTH < 24 HOURS Performed at [facility], [address] [place], [place] [address]    Report Status PENDING  Incomplete  Time coordinating discharge: 35 minutes  SIGNED:   Glade Lloyd, MD  [facility] 07/25/2022, 9:55 AM

## 2022-07-25 NOTE — Plan of Care (Signed)

## 2022-07-26 LAB — CULTURE, BLOOD (ROUTINE X 2): Culture: NO GROWTH

## 2022-07-26 LAB — URINE CULTURE: Culture: >=100000 — AB

## 2022-07-28 ENCOUNTER — Emergency Department (HOSPITAL_COMMUNITY): Payer: Medicare PPO

## 2022-07-28 ENCOUNTER — Other Ambulatory Visit: Payer: Self-pay

## 2022-07-28 ENCOUNTER — Encounter (HOSPITAL_COMMUNITY): Payer: Self-pay

## 2022-07-28 ENCOUNTER — Inpatient Hospital Stay (HOSPITAL_COMMUNITY)
Admission: EM | Admit: 2022-07-28 | Discharge: 2022-07-31 | DRG: 853 | Disposition: A | Discharge status: Home Health | Payer: Medicare PPO | Attending: Internal Medicine | Admitting: Internal Medicine

## 2022-07-28 DIAGNOSIS — E119 Type 2 diabetes mellitus without complications: Secondary | ICD-10-CM | POA: Diagnosis present

## 2022-07-28 DIAGNOSIS — J440 Chronic obstructive pulmonary disease with acute lower respiratory infection: Secondary | ICD-10-CM | POA: Diagnosis not present

## 2022-07-28 DIAGNOSIS — Z79890 Hormone replacement therapy (postmenopausal): Secondary | ICD-10-CM

## 2022-07-28 DIAGNOSIS — J432 Centrilobular emphysema: Secondary | ICD-10-CM | POA: Diagnosis present

## 2022-07-28 DIAGNOSIS — F32A Depression, unspecified: Secondary | ICD-10-CM | POA: Diagnosis present

## 2022-07-28 DIAGNOSIS — N390 Urinary tract infection, site not specified: Secondary | ICD-10-CM | POA: Diagnosis present

## 2022-07-28 DIAGNOSIS — Z818 Family history of other mental and behavioral disorders: Secondary | ICD-10-CM

## 2022-07-28 DIAGNOSIS — Z79899 Other long term (current) drug therapy: Secondary | ICD-10-CM

## 2022-07-28 DIAGNOSIS — E039 Hypothyroidism, unspecified: Secondary | ICD-10-CM | POA: Diagnosis present

## 2022-07-28 DIAGNOSIS — Z7951 Long term (current) use of inhaled steroids: Secondary | ICD-10-CM

## 2022-07-28 DIAGNOSIS — I7143 Infrarenal abdominal aortic aneurysm, without rupture: Secondary | ICD-10-CM | POA: Diagnosis present

## 2022-07-28 DIAGNOSIS — G9341 Metabolic encephalopathy: Secondary | ICD-10-CM

## 2022-07-28 DIAGNOSIS — E871 Hypo-osmolality and hyponatremia: Secondary | ICD-10-CM | POA: Diagnosis present

## 2022-07-28 DIAGNOSIS — S51811A Laceration without foreign body of right forearm, initial encounter: Secondary | ICD-10-CM | POA: Diagnosis present

## 2022-07-28 DIAGNOSIS — L89621 Pressure ulcer of left heel, stage 1: Secondary | ICD-10-CM | POA: Diagnosis present

## 2022-07-28 DIAGNOSIS — E785 Hyperlipidemia, unspecified: Secondary | ICD-10-CM | POA: Diagnosis present

## 2022-07-28 DIAGNOSIS — E860 Dehydration: Secondary | ICD-10-CM | POA: Diagnosis present

## 2022-07-28 DIAGNOSIS — R296 Repeated falls: Secondary | ICD-10-CM | POA: Diagnosis present

## 2022-07-28 DIAGNOSIS — F419 Anxiety disorder, unspecified: Secondary | ICD-10-CM | POA: Diagnosis present

## 2022-07-28 DIAGNOSIS — Z781 Physical restraint status: Secondary | ICD-10-CM

## 2022-07-28 DIAGNOSIS — I2510 Atherosclerotic heart disease of native coronary artery without angina pectoris: Secondary | ICD-10-CM | POA: Diagnosis present

## 2022-07-28 DIAGNOSIS — F17210 Nicotine dependence, cigarettes, uncomplicated: Secondary | ICD-10-CM | POA: Diagnosis not present

## 2022-07-28 DIAGNOSIS — Z9049 Acquired absence of other specified parts of digestive tract: Secondary | ICD-10-CM

## 2022-07-28 DIAGNOSIS — Z1152 Encounter for screening for COVID-19: Secondary | ICD-10-CM | POA: Diagnosis not present

## 2022-07-28 DIAGNOSIS — I10 Essential (primary) hypertension: Secondary | ICD-10-CM | POA: Diagnosis not present

## 2022-07-28 DIAGNOSIS — Z9181 History of falling: Secondary | ICD-10-CM

## 2022-07-28 DIAGNOSIS — Z716 Tobacco abuse counseling: Secondary | ICD-10-CM

## 2022-07-28 DIAGNOSIS — N201 Calculus of ureter: Secondary | ICD-10-CM | POA: Diagnosis not present

## 2022-07-28 DIAGNOSIS — S0083XA Contusion of other part of head, initial encounter: Secondary | ICD-10-CM | POA: Diagnosis present

## 2022-07-28 DIAGNOSIS — R4182 Altered mental status, unspecified: Principal | ICD-10-CM

## 2022-07-28 DIAGNOSIS — H9192 Unspecified hearing loss, left ear: Secondary | ICD-10-CM | POA: Diagnosis present

## 2022-07-28 DIAGNOSIS — I700 Atherosclerosis of aorta: Secondary | ICD-10-CM | POA: Diagnosis present

## 2022-07-28 DIAGNOSIS — Z880 Allergy status to penicillin: Secondary | ICD-10-CM

## 2022-07-28 DIAGNOSIS — Z66 Do not resuscitate: Secondary | ICD-10-CM | POA: Diagnosis present

## 2022-07-28 DIAGNOSIS — J449 Chronic obstructive pulmonary disease, unspecified: Secondary | ICD-10-CM | POA: Diagnosis not present

## 2022-07-28 DIAGNOSIS — B961 Klebsiella pneumoniae [K. pneumoniae] as the cause of diseases classified elsewhere: Secondary | ICD-10-CM | POA: Diagnosis present

## 2022-07-28 DIAGNOSIS — Z881 Allergy status to other antibiotic agents status: Secondary | ICD-10-CM

## 2022-07-28 DIAGNOSIS — B9689 Other specified bacterial agents as the cause of diseases classified elsewhere: Secondary | ICD-10-CM | POA: Diagnosis not present

## 2022-07-28 DIAGNOSIS — N139 Obstructive and reflux uropathy, unspecified: Secondary | ICD-10-CM | POA: Diagnosis present

## 2022-07-28 DIAGNOSIS — Y92013 Bedroom of single-family (private) house as the place of occurrence of the external cause: Secondary | ICD-10-CM | POA: Diagnosis not present

## 2022-07-28 DIAGNOSIS — M19011 Primary osteoarthritis, right shoulder: Secondary | ICD-10-CM | POA: Diagnosis present

## 2022-07-28 DIAGNOSIS — Z87442 Personal history of urinary calculi: Secondary | ICD-10-CM

## 2022-07-28 DIAGNOSIS — R911 Solitary pulmonary nodule: Secondary | ICD-10-CM | POA: Diagnosis present

## 2022-07-28 DIAGNOSIS — W19XXXA Unspecified fall, initial encounter: Secondary | ICD-10-CM | POA: Diagnosis not present

## 2022-07-28 DIAGNOSIS — N136 Pyonephrosis: Secondary | ICD-10-CM | POA: Diagnosis present

## 2022-07-28 DIAGNOSIS — K760 Fatty (change of) liver, not elsewhere classified: Secondary | ICD-10-CM | POA: Diagnosis present

## 2022-07-28 DIAGNOSIS — Z87440 Personal history of urinary (tract) infections: Secondary | ICD-10-CM

## 2022-07-28 DIAGNOSIS — Z888 Allergy status to other drugs, medicaments and biological substances status: Secondary | ICD-10-CM

## 2022-07-28 DIAGNOSIS — M19012 Primary osteoarthritis, left shoulder: Secondary | ICD-10-CM | POA: Diagnosis present

## 2022-07-28 DIAGNOSIS — Y92009 Unspecified place in unspecified non-institutional (private) residence as the place of occurrence of the external cause: Secondary | ICD-10-CM

## 2022-07-28 DIAGNOSIS — A419 Sepsis, unspecified organism: Secondary | ICD-10-CM | POA: Diagnosis not present

## 2022-07-28 DIAGNOSIS — W01190A Fall on same level from slipping, tripping and stumbling with subsequent striking against furniture, initial encounter: Secondary | ICD-10-CM | POA: Diagnosis present

## 2022-07-28 DIAGNOSIS — J189 Pneumonia, unspecified organism: Secondary | ICD-10-CM | POA: Diagnosis present

## 2022-07-28 DIAGNOSIS — E876 Hypokalemia: Secondary | ICD-10-CM | POA: Diagnosis not present

## 2022-07-28 HISTORY — DX: Metabolic encephalopathy: G93.41

## 2022-07-28 LAB — CBC WITH DIFFERENTIAL/PLATELET
Abs Immature Granulocytes: 0.21 10*3/uL — ABNORMAL HIGH (ref 0.00–0.07)
Basophils Absolute: 0.1 10*3/uL (ref 0.0–0.1)
Basophils Relative: 0 %
Eosinophils Absolute: 0 10*3/uL (ref 0.0–0.5)
Eosinophils Relative: 0 %
HCT: 34.7 % — ABNORMAL LOW (ref 36.0–46.0)
Hemoglobin: 11.3 g/dL — ABNORMAL LOW (ref 12.0–15.0)
Immature Granulocytes: 1 %
Lymphocytes Relative: 18 %
Lymphs Abs: 2.9 10*3/uL (ref 0.7–4.0)
MCH: 28.9 pg (ref 26.0–34.0)
MCHC: 32.6 g/dL (ref 30.0–36.0)
MCV: 88.7 fL (ref 80.0–100.0)
Monocytes Absolute: 1.2 10*3/uL — ABNORMAL HIGH (ref 0.1–1.0)
Monocytes Relative: 8 %
Neutro Abs: 11.5 10*3/uL — ABNORMAL HIGH (ref 1.7–7.7)
Neutrophils Relative %: 73 %
Platelets: 508 10*3/uL — ABNORMAL HIGH (ref 150–400)
RBC: 3.91 MIL/uL (ref 3.87–5.11)
RDW: 15.5 % (ref 11.5–15.5)
WBC: 15.9 10*3/uL — ABNORMAL HIGH (ref 4.0–10.5)
nRBC: 0 % (ref 0.0–0.2)

## 2022-07-28 LAB — URINALYSIS, ROUTINE W REFLEX MICROSCOPIC
Bacteria, UA: NONE SEEN
Bilirubin Urine: NEGATIVE
Glucose, UA: NEGATIVE mg/dL
Hgb urine dipstick: NEGATIVE
Ketones, ur: 5 mg/dL — AB
Leukocytes,Ua: NEGATIVE
Nitrite: NEGATIVE
Protein, ur: 100 mg/dL — AB
Specific Gravity, Urine: 1.011 (ref 1.005–1.030)
pH: 6 (ref 5.0–8.0)

## 2022-07-28 LAB — PROCALCITONIN: Procalcitonin: 5.74 ng/mL

## 2022-07-28 LAB — BASIC METABOLIC PANEL
Anion gap: 11 (ref 5–15)
BUN: <5 mg/dL — ABNORMAL LOW (ref 8–23)
CO2: 22 mmol/L (ref 22–32)
Calcium: 7.4 mg/dL — ABNORMAL LOW (ref 8.9–10.3)
Chloride: 98 mmol/L (ref 98–111)
Creatinine, Ser: 0.45 mg/dL (ref 0.44–1.00)
GFR, Estimated: >60 mL/min (ref >60)
Glucose, Bld: 88 mg/dL (ref 70–99)
Potassium: 2.8 mmol/L — ABNORMAL LOW (ref 3.5–5.1)
Sodium: 131 mmol/L — ABNORMAL LOW (ref 135–145)

## 2022-07-28 LAB — LACTIC ACID, PLASMA: Lactic Acid, Venous: 0.9 mmol/L (ref 0.5–1.9)

## 2022-07-28 LAB — MAGNESIUM: Magnesium: 1.3 mg/dL — ABNORMAL LOW (ref 1.7–2.4)

## 2022-07-28 MED ORDER — VANCOMYCIN HCL IN DEXTROSE 1-5 GM/200ML-% IV SOLN
1000.0000 mg | Freq: Once | INTRAVENOUS | Status: AC
  Administered 2022-07-29: 1000 mg via INTRAVENOUS
  Filled 2022-07-28: qty 200

## 2022-07-28 MED ORDER — IPRATROPIUM-ALBUTEROL 0.5-2.5 (3) MG/3ML IN SOLN: 3.0000 mL | RESPIRATORY_TRACT | Status: DC | PRN

## 2022-07-28 MED ORDER — FLUTICASONE FUROATE-VILANTEROL 100-25 MCG/ACT IN AEPB
1.0000 | INHALATION_SPRAY | Freq: Every day | RESPIRATORY_TRACT | Status: DC
  Administered 2022-07-29 – 2022-07-31 (×3): 1 via RESPIRATORY_TRACT
  Filled 2022-07-28: qty 28

## 2022-07-28 MED ORDER — PREGABALIN 25 MG PO CAPS
25.0000 mg | ORAL_CAPSULE | Freq: Three times a day (TID) | ORAL | Status: DC
  Administered 2022-07-28 – 2022-07-31 (×7): 25 mg via ORAL
  Filled 2022-07-28 (×8): qty 1

## 2022-07-28 MED ORDER — CARVEDILOL 25 MG PO TABS
25.0000 mg | ORAL_TABLET | Freq: Two times a day (BID) | ORAL | Status: DC
  Administered 2022-07-29 – 2022-07-31 (×4): 25 mg via ORAL
  Filled 2022-07-28 (×5): qty 1

## 2022-07-28 MED ORDER — ACETAMINOPHEN 650 MG RE SUPP: 650.0000 mg | Freq: Four times a day (QID) | RECTAL | Status: DC | PRN

## 2022-07-28 MED ORDER — LACTATED RINGERS IV BOLUS (SEPSIS): 500.0000 mL | Freq: Once | INTRAVENOUS | Status: DC

## 2022-07-28 MED ORDER — POLYETHYLENE GLYCOL 3350 17 G PO PACK: 17.0000 g | PACK | Freq: Every day | ORAL | Status: DC | PRN

## 2022-07-28 MED ORDER — LACTATED RINGERS IV BOLUS (SEPSIS)
1000.0000 mL | Freq: Once | INTRAVENOUS | Status: AC
  Administered 2022-07-28: 1000 mL via INTRAVENOUS

## 2022-07-28 MED ORDER — LACTATED RINGERS IV SOLN: INTRAVENOUS | Status: DC

## 2022-07-28 MED ORDER — PANTOPRAZOLE SODIUM 40 MG PO TBEC
40.0000 mg | DELAYED_RELEASE_TABLET | Freq: Every day | ORAL | Status: DC
  Administered 2022-07-29 – 2022-07-31 (×3): 40 mg via ORAL
  Filled 2022-07-28 (×3): qty 1

## 2022-07-28 MED ORDER — POTASSIUM CHLORIDE 2 MEQ/ML IV SOLN
INTRAVENOUS | Status: DC
  Filled 2022-07-28 (×2): qty 1000

## 2022-07-28 MED ORDER — SODIUM CHLORIDE 0.9 % IV SOLN
2.0000 g | Freq: Once | INTRAVENOUS | Status: AC
  Administered 2022-07-28: 2 g via INTRAVENOUS
  Filled 2022-07-28: qty 12.5

## 2022-07-28 MED ORDER — MONTELUKAST SODIUM 10 MG PO TABS
10.0000 mg | ORAL_TABLET | Freq: Every day | ORAL | Status: DC
  Administered 2022-07-28 – 2022-07-29 (×2): 10 mg via ORAL
  Filled 2022-07-28 (×3): qty 1

## 2022-07-28 MED ORDER — FLUOXETINE HCL 20 MG PO CAPS
60.0000 mg | ORAL_CAPSULE | Freq: Every day | ORAL | Status: DC
  Administered 2022-07-28 – 2022-07-31 (×4): 60 mg via ORAL
  Filled 2022-07-28 (×4): qty 3

## 2022-07-28 MED ORDER — UMECLIDINIUM BROMIDE 62.5 MCG/ACT IN AEPB
1.0000 | INHALATION_SPRAY | Freq: Every day | RESPIRATORY_TRACT | Status: DC
  Administered 2022-07-29 – 2022-07-31 (×3): 1 via RESPIRATORY_TRACT
  Filled 2022-07-28: qty 7

## 2022-07-28 MED ORDER — VANCOMYCIN HCL 750 MG/150ML IV SOLN
750.0000 mg | INTRAVENOUS | Status: DC
  Administered 2022-07-29 – 2022-07-30 (×2): 750 mg via INTRAVENOUS
  Filled 2022-07-28 (×2): qty 150

## 2022-07-28 MED ORDER — ALBUTEROL SULFATE (2.5 MG/3ML) 0.083% IN NEBU: 3.0000 mL | INHALATION_SOLUTION | Freq: Four times a day (QID) | RESPIRATORY_TRACT | Status: DC | PRN

## 2022-07-28 MED ORDER — ONDANSETRON HCL 4 MG/2ML IJ SOLN: 4.0000 mg | Freq: Four times a day (QID) | INTRAMUSCULAR | Status: DC | PRN

## 2022-07-28 MED ORDER — SODIUM CHLORIDE 0.9 % IV SOLN
2.0000 g | Freq: Two times a day (BID) | INTRAVENOUS | Status: DC
  Administered 2022-07-29 – 2022-07-30 (×3): 2 g via INTRAVENOUS
  Filled 2022-07-28 (×3): qty 12.5

## 2022-07-28 MED ORDER — ATORVASTATIN CALCIUM 20 MG PO TABS
20.0000 mg | ORAL_TABLET | Freq: Every day | ORAL | Status: DC
  Administered 2022-07-29 – 2022-07-31 (×3): 20 mg via ORAL
  Filled 2022-07-28 (×3): qty 1

## 2022-07-28 MED ORDER — SODIUM CHLORIDE 0.9 % IV SOLN
2.0000 g | INTRAVENOUS | Status: DC
  Administered 2022-07-28: 2 g via INTRAVENOUS
  Filled 2022-07-28: qty 20

## 2022-07-28 MED ORDER — ONDANSETRON HCL 4 MG PO TABS
4.0000 mg | ORAL_TABLET | Freq: Four times a day (QID) | ORAL | Status: DC | PRN
  Administered 2022-07-31: 4 mg via ORAL
  Filled 2022-07-28: qty 1

## 2022-07-28 MED ORDER — CALCITONIN (SALMON) 200 UNIT/ACT NA SOLN
1.0000 | Freq: Every day | NASAL | Status: DC
  Administered 2022-07-31: 1 via NASAL
  Filled 2022-07-28: qty 3.7

## 2022-07-28 MED ORDER — POTASSIUM CHLORIDE CRYS ER 20 MEQ PO TBCR
40.0000 meq | EXTENDED_RELEASE_TABLET | Freq: Once | ORAL | Status: AC
  Administered 2022-07-28: 40 meq via ORAL
  Filled 2022-07-28: qty 2

## 2022-07-28 MED ORDER — ENOXAPARIN SODIUM 40 MG/0.4ML IJ SOSY
40.0000 mg | PREFILLED_SYRINGE | INTRAMUSCULAR | Status: DC
  Administered 2022-07-29 – 2022-07-30 (×3): 40 mg via SUBCUTANEOUS
  Filled 2022-07-28 (×3): qty 0.4

## 2022-07-28 MED ORDER — BACITRACIN ZINC 500 UNIT/GM EX OINT
TOPICAL_OINTMENT | Freq: Two times a day (BID) | CUTANEOUS | Status: DC
  Filled 2022-07-28: qty 1.8

## 2022-07-28 MED ORDER — ACETAMINOPHEN 325 MG PO TABS
650.0000 mg | ORAL_TABLET | Freq: Four times a day (QID) | ORAL | Status: DC | PRN
  Filled 2022-07-28: qty 2

## 2022-07-28 NOTE — H&P (Signed)
History and Physical    Patient: Allison Jimenez MRN: [id number] DOA: 07/28/2022  Date of Service: the patient was seen and examined on 07/29/2022  Patient coming from: Home   Chief Complaint:  Chief Complaint  Patient presents with   Fall    HPI:   72 year old female with past medical history of COPD, hypertension, hyperlipidemia, depression, anxiety, infrarenal abdominal aortic aneurysm (4.2cm identified on CT abdomen 07/24/2022) presenting to [facility] emergency department with her sister due to increasing lethargy and fall this morning.  Of note, patient was recently hospitalized at [facility] long hospital from 7/23 until 7/24.  Patient initially presented with nausea vomiting, abdominal pain and significant leukocytosis.  Patient was found to have a Klebsiella urinary tract infection was initially treated with intravenous ceftriaxone.  Patient was adamant about leaving on hospital day 2 and therefore was discharged home with the remaining course of antibiotics with oral Keflex.  Patient is an extremely poor historian and therefore majority the history is been obtained via discussions with the sister and discussions with the emergency department staff.  Since discharge, patient has been experiencing persisting abdominal pain that she describes as epigastric and sore in quality.  She also has been experiencing poor oral intake with occasional vomiting.  Patient is also complaining of ongoing cough productive with yellow sputum although she states that she has a chronic cough at baseline and is unclear as to whether this is different than her baseline.  As patient's symptoms progressively worsen patient reports increasing difficulty with ambulation and is experienced a fall on at least 2 occasions most recent being this morning after getting out of bed.  Due to progressively worsening symptoms the patient's sister whom she lives with brought her in to [facility] emergency  department for evaluation.  Upon evaluation in the emergency department patient was found to have multiple SIRS criteria including tachycardia leukocytosis and clinical evidence of dehydration and encephalopathy.  Due to concerns for sepsis possibly related to patient's recent urinary tract infection hospital group has been called to assess the patient for admission to the hospital.  Review of Systems: Review of Systems  Unable to perform ROS: Mental status change     Past Medical History:  Diagnosis Date   Acute blood loss anemia (ABLA) 09/05/2020   Allergy    ANXIETY 06/05/2006   Arthritis    SHOULDERS    BACK PAIN 01/05/2010   Cataract    bilateral, left worse   Centrilobular emphysema (HCC)    COPD (chronic obstructive pulmonary disease) (HCC)    Depression    Diabetes mellitus without complication (HCC)    no meds, diet controllled   DIVERTICULOSIS, COLON 06/05/2006   DIZZINESS OR VERTIGO 06/05/2006   positional   Hearing loss in left ear    no hearing aid   History of blood transfusion 08/2020   History of kidney stones    passed stones   HYPERLIPIDEMIA 06/05/2006   Hypertension    HYPOKALEMIA 12/14/2008   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   Qualifier: Diagnosis of  By: Amador Cunas  MD, Janett Labella    Migraine    TOBACCO USER 11/25/2007    Past Surgical History:  Procedure Laterality Date   APPENDECTOMY     BRONCHIAL BIOPSY  09/28/2021   Procedure: BRONCHIAL BIOPSIES;  Surgeon: Omar Person, MD;  Location: [facility] ENDOSCOPY;  Service: Pulmonary;;   BRONCHIAL NEEDLE ASPIRATION BIOPSY  09/28/2021   Procedure: BRONCHIAL NEEDLE ASPIRATION BIOPSIES;  Surgeon: Omar Person,  MD;  Location: [facility] ENDOSCOPY;  Service: Pulmonary;;   BRONCHIAL WASHINGS  09/28/2021   Procedure: BRONCHIAL WASHINGS;  Surgeon: Omar Person, MD;  Location: [facility] ENDOSCOPY;  Service: Pulmonary;;   CESAREAN SECTION     x1   CHOLECYSTECTOMY     COLONOSCOPY     COSMETIC SURGERY     mastoid surgery   1992   shunt in mastoid- endolymphatic sac in left ear    MRI  07/11/2020   x several, last one located in CE   TUBAL LIGATION     UPPER GI ENDOSCOPY     vocal cord surgery     nodule x2    Social History:  reports that she has been smoking cigarettes. She has a 24.5 pack-year smoking history. She has never used smokeless tobacco. She reports that she does not drink alcohol and does not use drugs.  Allergies  Allergen Reactions   Erythromycin Base Diarrhea   Vfend [Voriconazole] Other (See Comments)    Visual changes   Penicillins Rash    Family History  Problem Relation Age of Onset   Breast cancer Mother    Migraines Other    Depression Other    Anxiety disorder Other    High Cholesterol Other    Colon cancer Neg Hx    Rectal cancer Neg Hx    Stomach cancer Neg Hx    Colon polyps Neg Hx    Esophageal cancer Neg Hx     Prior to Admission medications   Medication Sig Start Date End Date Taking? Authorizing Provider  acetaminophen (TYLENOL) 500 MG tablet Take 500-1,000 mg by mouth every 6 (six) hours as needed for mild pain, moderate pain or headache.   Yes [provider]  albuterol (VENTOLIN HFA) 108 (90 Base) MCG/ACT inhaler Inhale 2 puffs into the lungs every 6 (six) hours as needed for shortness of breath or wheezing. 05/23/22  Yes Omar Person, MD  ALPRAZolam Prudy Feeler) 0.25 MG tablet Take 0.125-0.25 mg by mouth 3 (three) times daily as needed for anxiety.   Yes [provider]  atorvastatin (LIPITOR) 20 MG tablet TAKE 1 TABLET(20 MG) BY MOUTH DAILY Patient taking differently: Take 20 mg by mouth daily. 02/05/22  Yes Cox, Kirsten, MD  Carboxymethylcellul-Glycerin (LUBRICATING EYE DROPS OP) Place 1 drop into both eyes daily.   Yes [provider]  carvedilol (COREG) 25 MG tablet Take 1 tablet (25 mg total) by mouth 2 (two) times daily with a meal. 04/09/22  Yes Cox, Kirsten, MD  calcitonin, salmon, (MIACALCIN/FORTICAL) 200 UNIT/ACT nasal spray  Place 1 spray into alternate nostrils daily. 07/20/22   [provider]  cephALEXin (KEFLEX) 500 MG capsule Take 1 capsule (500 mg total) by mouth 3 (three) times daily for 5 days. 07/26/22 07/31/22  Glade Lloyd, MD  cetirizine (ZYRTEC) 10 MG tablet Take 10 mg by mouth daily as needed for allergies.    [provider]  conjugated estrogens (PREMARIN) vaginal cream Place 1 applicator vaginally daily as needed (dryness / burning).    [provider]  cyclobenzaprine (FLEXERIL) 10 MG tablet Take 1 tablet (10 mg total) by mouth 3 (three) times daily as needed for muscle spasms. 07/20/22   Cox, Fritzi Mandes, MD  diclofenac Sodium (VOLTAREN) 1 % GEL Apply 1 Application topically 4 (four) times daily as needed (pain). 07/25/22   Glade Lloyd, MD  dicyclomine (BENTYL) 20 MG tablet Take 1 tablet (20 mg total) by mouth 2 (two) times daily as needed for  spasms. 04/20/21   Cox, Kirsten, MD  Erenumab-aooe (AIMOVIG) 140 MG/ML SOAJ Inject 140 mg as directed every 30 (thirty) days.    [provider]  estradiol (ESTRACE) 0.1 MG/GM vaginal cream Place 1 Applicatorful vaginally at bedtime. Daily for 14 days then twice weekly 01/20/21   Cox, Fritzi Mandes, MD  famotidine (PEPCID) 40 MG tablet TAKE 1 TABLET(40 MG) BY MOUTH AT BEDTIME 03/07/22   Cox, Kirsten, MD  FLUoxetine (PROZAC) 20 MG capsule Take 3 capsules (60 mg total) by mouth daily. 04/09/22   Cox, Fritzi Mandes, MD  fluticasone [facility]) 50 MCG/ACT nasal spray SHAKE LIQUID AND USE 2 SPRAYS IN [facility] NOSTRIL DAILY 11/26/21   Janie Morning, NP  Fluticasone-Umeclidin-Vilant (TRELEGY ELLIPTA) 100-62.5-25 MCG/ACT AEPB Inhale 1 puff into the lungs daily. 04/25/22   Cox, Fritzi Mandes, MD  ketoconazole (NIZORAL) 2 % cream Apply 1 Application topically daily as needed (cracked skin). 07/25/22   Glade Lloyd, MD  linaclotide (LINZESS) 145 MCG CAPS capsule Take 1 capsule (145 mcg total) by mouth daily as needed (constipation). 07/25/22   Glade Lloyd, MD   montelukast (SINGULAIR) 10 MG tablet TAKE 1 TABLET(10 MG) BY MOUTH AT BEDTIME 05/21/22   Cox, Kirsten, MD  Multiple Vitamins-Minerals (MULTIVITAMIN WITH IRON-MINERALS) liquid Take 15 mLs by mouth daily.    [provider]  oxyCODONE-acetaminophen (PERCOCET) 10-325 MG tablet Take 1 tablet by mouth 4 (four) times daily as needed. 06/20/22   [provider]  pantoprazole (PROTONIX) 40 MG tablet TAKE 1 TABLET(40 MG) BY MOUTH DAILY 05/29/22   Cox, Kirsten, MD  potassium chloride (MICRO-K) 10 MEQ CR capsule Take 2 capsules (20 mEq total) by mouth 2 (two) times daily. 03/06/22   Cox, Fritzi Mandes, MD  pregabalin (LYRICA) 25 MG capsule Take 25 mg by mouth 3 (three) times daily. 07/20/22   [provider]  promethazine (PHENERGAN) 25 MG tablet TAKE 1 TABLET BY MOUTH EVERY 8 HOURS IF NEEDED 07/20/22   Cox, Kirsten, MD  rizatriptan (MAXALT) 10 MG tablet Take 1 tablet (10 mg total) by mouth as needed for migraine. May repeat in 2 hours if needed 04/30/17   Drema Dallas, DO  sennosides-docusate sodium (SENOKOT-S) 8.6-50 MG tablet Take 1 tablet by mouth daily. 03/27/22   Cox, Fritzi Mandes, MD  sucralfate (CARAFATE) 1 g tablet Take 1 g by mouth 4 (four) times daily. 06/14/22   [provider]  vitamin B-12 (CYANOCOBALAMIN) 500 MCG tablet TAKE 1 TABLET BY MOUTH DAILY AT NOON 02/26/22   Blane Ohara, MD    Physical Exam:  Vitals:   07/28/22 2120 07/28/22 2151 07/28/22 2345 07/29/22 0000  BP: (!) 156/85 (!) 163/101 (!) 148/96 116/73  Pulse: (!) 126 (!) 126 (!) 129 (!) 135  Resp: 17 20 15 16   Temp:  98.6 F (37 C)    TempSrc:  Oral    SpO2: 99% 98% 97% 94%  Weight:      Height:        Constitutional: Patient is large but arousable, oriented x 1 Skin: no rashes, no lesions, poor skin turgor noted. Eyes: Pupils are equally reactive to light.  No evidence of scleral icterus or conjunctival pallor.  ENMT: Dry mucous membranes noted.  Posterior pharynx clear of any exudate or lesions.    Neck: normal, supple, no masses, no thyromegaly.  No evidence of jugular venous distension.   Respiratory: Scattered rhonchi bilaterally with bibasilar rales.  No evidence of wheezing at this time.  No evidence of increased respiratory effort or accessory muscle  use. Cardiovascular: Tachycardic rate with regular rhythm, no murmurs / rubs / gallops. No extremity edema. 2+ pedal pulses. No carotid bruits.  Chest:   Nontender without crepitus or deformity.   Back:   Nontender without crepitus or deformity. Abdomen: Abdomen is soft and nontender.  No evidence of intra-abdominal masses.  Positive bowel sounds noted in all quadrants.   Musculoskeletal: No joint deformity upper and lower extremities. Good ROM, no contractures.  Poor tone. Neurologic: Notably lethargic but arousable.  CN 2-12 grossly intact. Sensation intact.  Patient moving all 4 extremities spontaneously.  Patient is following all commands.  Patient is responsive to verbal stimuli.   Psychiatric: Unable to appropriately assess due to lethargy and confusion.  Patient currently does not seem to possess insight as to her current situation.  Data Reviewed:  I have personally reviewed and interpreted labs, imaging.  Significant findings are   Lab Results  Component Value Date   WBC 15.9 (H) 07/28/2022   HGB 11.3 (L) 07/28/2022   HCT 34.7 (L) 07/28/2022   MCV 88.7 07/28/2022   PLT 508 (H) 07/28/2022   Lab Results  Component Value Date   K 2.8 (L) 07/28/2022   Lab Results  Component Value Date   BUN <5 (L) 07/28/2022   Lab Results  Component Value Date   CREATININE 0.45 07/28/2022    CXR:   Chest X-ray was personally reviewed.  No evidence of focal infiltrates.  No evidence of pleural effusion.  No evidence of pneumothorax.    EKG: Personally reviewed.  Rhythm is sinus tachycardia with heart rate of 123 bpm.  No dynamic ST segment changes appreciated.    Assessment and Plan: * Sepsis [facility]) Patient presenting with  persisting symptoms with worsening fatigue, shortness of breath and poor oral intake several days after being diagnosed with Klebsiella urinary tract infection and discharged 7/24 Multiple SIRS criteria including leukocytosis and tachycardia on arrival concerning for developing sepsis Resuming intravenous ceftriaxone for now Initiating intravenous fluids Admitting to progressive unit Obtaining urinalysis, chest x-ray, blood cultures, procalcitonin, lactic acid and CRP On exam patient abdomen is benign however patient is complaining of abdominal pain, will obtain repeat CT imaging of the abdomen and pelvis.  UTI due to Klebsiella species Urine culture on 7/24 growing out Klebsiella Placing on intravenous ceftriaxone for now Obtaining repeat urine and blood cultures Patient complaining of abdominal pain, will obtain CT imaging of the abdomen and pelvis  Acute metabolic encephalopathy Patient lethargic with periods of confusion and evidence of disorientation and confusion during the interview.   Patient likely encephalopathic secondary to underlying sepsis  Treating underlying infection with intravenous fluids and intravenous antibiotics Monitoring for symptomatic improvement  Hypokalemia Replacing with oral and intravenous potassium Monitoring potassium levels with serial chemistries  Fall at home, initial encounter Increasing weakness in the past several days with at least 2 falls with one fall this morning prompting the patient's presentation here today Falls likely secondary to increasing weakness from underlying infection Trauma survey here in the emergency department reveals no evidence of fracture. Once patient is clinically improved we will obtain PT evaluation  Hyponatremia Likely multifactorial hyponatremia.  Patient seems to have a longstanding history of chronic hyponatremia but on exam is clinically volume depleted. Hydrating with intravenous isotonic fluids Monitoring  sodium levels with serial chemistries  COPD (chronic obstructive pulmonary disease) (HCC) Resume home regimen of maintenance inhalers As needed nebulized bronchodilator therapy for bouts of shortness of breath and wheezing  Hypothyroidism (acquired) Documented history  of hypothyroidism however patient is currently not on Synthroid per review of home medications TSH 2.6 01/2022  Nicotine dependence, cigarettes, uncomplicated Counseling on cessation once patient is medically improved Nicotine patches ordered       Code Status:  DNR  code status decision has been confirmed via review of Vynca.  Family Communication: Sister was updated on plan of care while in the emergency department  Consults: None  Severity of Illness:  The appropriate patient status for this patient is INPATIENT. Inpatient status is judged to be reasonable and necessary in order to provide the required intensity of service to ensure the patient's safety. The patient's presenting symptoms, physical exam findings, and initial radiographic and laboratory data in the context of their chronic comorbidities is felt to place them at high risk for further clinical deterioration. Furthermore, it is not anticipated that the patient will be medically stable for discharge from the hospital within 2 midnights of admission.   * I certify that at the point of admission it is my clinical judgment that the patient will require inpatient hospital care spanning beyond 2 midnights from the point of admission due to high intensity of service, high risk for further deterioration and high frequency of surveillance required.*  Author:  Marinda Elk MD  07/29/2022 1:16 AM

## 2022-07-28 NOTE — ED Notes (Signed)
Pt attempted to urinate several times but was unable to void. Will ask provider about an in and out.

## 2022-07-28 NOTE — ED Notes (Signed)
[place]

## 2022-07-28 NOTE — Progress Notes (Signed)
Pharmacy Antibiotic Note  Allison Jimenez is a 73 y.o. female admitted on 07/28/2022 with sepsis.  Pharmacy has been consulted for vancomycin & cefepime dosing.  Plan: Vancomycin 1 gm IV x 1 then vancomycin 750 mg IV q24 for eAUC 510.5, SCr rounded up to 0.8 Cefepime 2 gm IV q12h  Height: 5' (152.4 cm) Weight: 47 kg (103 lb 9.9 oz) IBW/kg (Calculated) : 45.5  Temp (24hrs), Avg:98.6 F (37 C), Min:98.2 F (36.8 C), Max:98.9 F (37.2 C)  Recent Labs  Lab 07/24/22 1519 07/24/22 1601 07/24/22 1820 07/25/22 0554 07/28/22 1845 07/28/22 1940  WBC 27.2*  --   --  12.2* 15.9*  --   CREATININE 0.62  --   --  0.44 0.45  --   LATICACIDVEN  --  1.4 0.9  --   --  0.9    Estimated Creatinine Clearance: 47 mL/min (by C-G formula based on SCr of 0.45 mg/dL).    Allergies  Allergen Reactions   Erythromycin Base Diarrhea   Vfend [Voriconazole] Other (See Comments)    Visual changes   Penicillins Rash    Thank you for allowing pharmacy to be a part of this patient's care.  Herby Abraham, Pharm.D Use secure chat for questions 07/28/2022 9:38 PM

## 2022-07-28 NOTE — Progress Notes (Signed)
Pt being followed by [facility] for Sepsis protocol. 

## 2022-07-28 NOTE — ED Provider Notes (Signed)
[place] EMERGENCY DEPARTMENT AT [facility] Provider Note   CSN: [id number] Arrival date & time: 07/28/22  1256     History  Chief Complaint  Patient presents with   Allison Jimenez    Allison Jimenez is a 72 y.o. female history of anemia, COPD, hypertension presented for multiple falls.  Patient recently was discharged from the hospital for UTI middle of last week and states that since then she has had 2 falls.  Both have been unwitnessed but patient states that these are both mechanical as she states she slips on objects or rolled out of bed.  First fall occurred a few days ago in which patient fell on her butt and hit the back of her head.  Fall earlier today included rolling out of bed and hitting her face on the dresser.  Patient also notes that she hit both of her arms and legs that she fell down but is not sure of what she hit them on.  Patient denies any LOC, blood thinners, vision changes, headache, neck pain, abdominal pain, nausea/vomiting, changes sensation/motor skills, fevers.  Patient is still taking the Keflex for her UTI.    Home Medications Prior to Admission medications   Medication Sig Start Date End Date Taking? Authorizing Provider  acetaminophen (TYLENOL) 500 MG tablet Take 500-1,000 mg by mouth every 6 (six) hours as needed for mild pain, moderate pain or headache.   Yes [provider]  albuterol (VENTOLIN HFA) 108 (90 Base) MCG/ACT inhaler Inhale 2 puffs into the lungs every 6 (six) hours as needed for shortness of breath or wheezing. 05/23/22  Yes Omar Person, MD  ALPRAZolam Prudy Feeler) 0.25 MG tablet Take 0.125-0.25 mg by mouth 3 (three) times daily as needed for anxiety.   Yes [provider]  atorvastatin (LIPITOR) 20 MG tablet TAKE 1 TABLET(20 MG) BY MOUTH DAILY Patient taking differently: Take 20 mg by mouth daily. 02/05/22  Yes Cox, Kirsten, MD  calcitonin, salmon, (MIACALCIN/FORTICAL) 200 UNIT/ACT nasal spray Place 1 spray into  alternate nostrils daily. 07/20/22   [provider]  Carboxymethylcellul-Glycerin (LUBRICATING EYE DROPS OP) Place 1 drop into both eyes daily as needed (dry eyes).    [provider]  carvedilol (COREG) 25 MG tablet Take 1 tablet (25 mg total) by mouth 2 (two) times daily with a meal. 04/09/22   Cox, Kirsten, MD  cephALEXin (KEFLEX) 500 MG capsule Take 1 capsule (500 mg total) by mouth 3 (three) times daily for 5 days. 07/26/22 07/31/22  Glade Lloyd, MD  cetirizine (ZYRTEC) 10 MG tablet Take 10 mg by mouth daily as needed for allergies.    [provider]  conjugated estrogens (PREMARIN) vaginal cream Place 1 applicator vaginally daily as needed (dryness / burning).    [provider]  cyclobenzaprine (FLEXERIL) 10 MG tablet Take 1 tablet (10 mg total) by mouth 3 (three) times daily as needed for muscle spasms. 07/20/22   Cox, Fritzi Mandes, MD  diclofenac Sodium (VOLTAREN) 1 % GEL Apply 1 Application topically 4 (four) times daily as needed (pain). 07/25/22   Glade Lloyd, MD  dicyclomine (BENTYL) 20 MG tablet Take 1 tablet (20 mg total) by mouth 2 (two) times daily as needed for spasms. 04/20/21   Cox, Kirsten, MD  Erenumab-aooe (AIMOVIG) 140 MG/ML SOAJ Inject 140 mg as directed every 30 (thirty) days.    [provider]  estradiol (ESTRACE) 0.1 MG/GM vaginal cream Place 1 Applicatorful vaginally at bedtime. Daily for 14 days then twice  weekly 01/20/21   Cox, Fritzi Mandes, MD  famotidine (PEPCID) 40 MG tablet TAKE 1 TABLET(40 MG) BY MOUTH AT BEDTIME 03/07/22   Cox, Kirsten, MD  FLUoxetine (PROZAC) 20 MG capsule Take 3 capsules (60 mg total) by mouth daily. 04/09/22   Cox, Fritzi Mandes, MD  fluticasone [facility]) 50 MCG/ACT nasal spray SHAKE LIQUID AND USE 2 SPRAYS IN [facility] NOSTRIL DAILY 11/26/21   Janie Morning, NP  Fluticasone-Umeclidin-Vilant (TRELEGY ELLIPTA) 100-62.5-25 MCG/ACT AEPB Inhale 1 puff into the lungs daily. 04/25/22   Cox, Fritzi Mandes, MD  ketoconazole (NIZORAL) 2  % cream Apply 1 Application topically daily as needed (cracked skin). 07/25/22   Glade Lloyd, MD  linaclotide (LINZESS) 145 MCG CAPS capsule Take 1 capsule (145 mcg total) by mouth daily as needed (constipation). 07/25/22   Glade Lloyd, MD  montelukast (SINGULAIR) 10 MG tablet TAKE 1 TABLET(10 MG) BY MOUTH AT BEDTIME 05/21/22   Cox, Kirsten, MD  Multiple Vitamins-Minerals (MULTIVITAMIN WITH IRON-MINERALS) liquid Take 15 mLs by mouth daily.    [provider]  oxyCODONE-acetaminophen (PERCOCET) 10-325 MG tablet Take 1 tablet by mouth 4 (four) times daily as needed. 06/20/22   [provider]  pantoprazole (PROTONIX) 40 MG tablet TAKE 1 TABLET(40 MG) BY MOUTH DAILY 05/29/22   Cox, Kirsten, MD  potassium chloride (MICRO-K) 10 MEQ CR capsule Take 2 capsules (20 mEq total) by mouth 2 (two) times daily. 03/06/22   Cox, Fritzi Mandes, MD  pregabalin (LYRICA) 25 MG capsule Take 25 mg by mouth 3 (three) times daily. 07/20/22   [provider]  promethazine (PHENERGAN) 25 MG tablet TAKE 1 TABLET BY MOUTH EVERY 8 HOURS IF NEEDED 07/20/22   Cox, Kirsten, MD  rizatriptan (MAXALT) 10 MG tablet Take 1 tablet (10 mg total) by mouth as needed for migraine. May repeat in 2 hours if needed 04/30/17   Drema Dallas, DO  sennosides-docusate sodium (SENOKOT-S) 8.6-50 MG tablet Take 1 tablet by mouth daily. 03/27/22   Cox, Fritzi Mandes, MD  sucralfate (CARAFATE) 1 g tablet Take 1 g by mouth 4 (four) times daily. 06/14/22   [provider]  vitamin B-12 (CYANOCOBALAMIN) 500 MCG tablet TAKE 1 TABLET BY MOUTH DAILY AT NOON 02/26/22   CoxFritzi Mandes, MD      Allergies    Erythromycin base, Vfend [voriconazole], and Penicillins    Review of Systems   Review of Systems  Physical Exam Updated Vital Signs BP 139/88   Pulse (!) 121   Temp 98.9 F (37.2 C) (Oral)   Resp 12   Ht 5' (1.524 m)   Wt 47 kg   SpO2 97%   BMI 20.24 kg/m  Physical Exam Vitals reviewed.  Constitutional:      General: She is  not in acute distress. HENT:     Head: Normocephalic.     Comments: Bruising and hematoma noted to right forehead No step-off/crepitus palpated on skull    Right Ear: Tympanic membrane, ear canal and external ear normal.     Left Ear: Tympanic membrane, ear canal and external ear normal.     Ears:     Comments: No mastoid ecchymosis or tenderness No hemotympanum     Nose: Nose normal.     Mouth/Throat:     Mouth: Mucous membranes are moist.     Pharynx: No posterior oropharyngeal erythema.  Eyes:     Extraocular Movements: Extraocular movements intact.     Conjunctiva/sclera: Conjunctivae normal.     Pupils: Pupils are equal, round, and reactive  to light.     Comments: No periorbital ecchymosis  Cardiovascular:     Rate and Rhythm: Normal rate and regular rhythm.     Pulses: Normal pulses.     Heart sounds: Normal heart sounds.     Comments: 2+ bilateral radial/dorsalis pedis pulses with regular rate Pulmonary:     Effort: Pulmonary effort is normal. No respiratory distress.     Breath sounds: Normal breath sounds.  Abdominal:     Palpations: Abdomen is soft.     Tenderness: There is no abdominal tenderness. There is no guarding or rebound.  Musculoskeletal:        General: Normal range of motion.     Cervical back: Normal range of motion and neck supple.     Comments: 5 out of 5 bilateral grip/leg extension strength  Skin:    General: Skin is warm and dry.     Capillary Refill: Capillary refill takes less than 2 seconds.     Comments: Skin tears noted to right forearm/hand, left forearm, left shin  Neurological:     General: No focal deficit present.     Mental Status: She is alert.     Comments: Sensation intact in all 4 limbs Alert and oriented x 1 to person  Psychiatric:        Mood and Affect: Mood normal.     ED Results / Procedures / Treatments   Labs (all labs ordered are listed, but only abnormal results are displayed) Labs Reviewed  BASIC METABOLIC PANEL  - Abnormal; Notable for the following components:      Result Value   Sodium 131 (*)    Potassium 2.8 (*)    BUN <5 (*)    Calcium 7.4 (*)    All other components within normal limits  CBC WITH DIFFERENTIAL/PLATELET - Abnormal; Notable for the following components:   WBC 15.9 (*)    Hemoglobin 11.3 (*)    HCT 34.7 (*)    Platelets 508 (*)    Neutro Abs 11.5 (*)    Monocytes Absolute 1.2 (*)    Abs Immature Granulocytes 0.21 (*)    All other components within normal limits  URINALYSIS, ROUTINE W REFLEX MICROSCOPIC - Abnormal; Notable for the following components:   Ketones, ur 5 (*)    Protein, ur 100 (*)    All other components within normal limits  CULTURE, BLOOD (ROUTINE X 2)  CULTURE, BLOOD (ROUTINE X 2)  RESP PANEL BY RT-PCR (RSV, FLU A&B, COVID)  RVPGX2  MAGNESIUM  C-REACTIVE PROTEIN  PROCALCITONIN  LACTIC ACID, PLASMA    EKG None  Radiology CT Cervical Spine Wo Contrast  Result Date: 07/28/2022 CLINICAL DATA:  Trauma, fall EXAM: CT CERVICAL SPINE WITHOUT CONTRAST TECHNIQUE: Multidetector CT imaging of the cervical spine was performed without intravenous contrast. Multiplanar CT image reconstructions were also generated. RADIATION DOSE REDUCTION: This exam was performed according to the departmental dose-optimization program which includes automated exposure control, adjustment of the mA and/or kV according to patient size and/or use of iterative reconstruction technique. COMPARISON:  None Available. FINDINGS: Alignment: There is minimal anterolisthesis at C3-C4 level, possibly due to previous ligament injury. Skull base and vertebrae: No recent fracture is seen. Degenerative changes are noted at multiple levels. Soft tissues and spinal canal: There is extrinsic pressure over the ventral margin of thecal sac caused by posterior bony spurs and bulging of the annulus at multiple levels, more prominent at C4-C5 and C5-C6 levels. Calcifications are seen adjacent to  the lamina in  C4 and C5 vertebrae causing mild extrinsic pressure over the dorsal margin of the thecal sac. Disc levels: There is encroachment of neural foramina from C4-C7 levels. Upper chest: Linear densities are seen in both apices suggesting possible scarring. Blebs and bullae are seen in the apices. Other: Scattered arterial calcifications are seen. IMPRESSION: No recent fracture is seen in cervical spine. Cervical spondylosis with encroachment of neural foramina from C4-C7 levels. Electronically Signed   By: Ernie Avena M.D.   On: 07/28/2022 17:04   CT Head Wo Contrast  Result Date: 07/28/2022 CLINICAL DATA:  Trauma, fall EXAM: CT HEAD WITHOUT CONTRAST TECHNIQUE: Contiguous axial images were obtained from the base of the skull through the vertex without intravenous contrast. RADIATION DOSE REDUCTION: This exam was performed according to the departmental dose-optimization program which includes automated exposure control, adjustment of the mA and/or kV according to patient size and/or use of iterative reconstruction technique. COMPARISON:  07/11/2020 FINDINGS: Brain: No acute intracranial findings are seen. There are no signs of bleeding within the cranium. Cortical sulci are prominent. Vascular: Unremarkable. Skull: No fracture is seen in calvarium. Sinuses/Orbits: There are no air-fluid levels in paranasal sinuses. Orbits are unremarkable. Other: None. IMPRESSION: No acute intracranial findings are seen in noncontrast CT brain. Atrophy. Electronically Signed   By: Ernie Avena M.D.   On: 07/28/2022 16:59   DG Pelvis 1-2 Views  Result Date: 07/28/2022 CLINICAL DATA:  Pain after fall EXAM: PELVIS - 1 VIEW COMPARISON:  None Available. FINDINGS: Osteopenia. Hyperostosis. Mild sclerosis along the sacroiliac joints. Scattered vascular calcifications. With this level of osteopenia subtle nondisplaced injury is difficult to completely exclude and if needed additional cross-sectional imaging as clinically  directed. IMPRESSION: Osteopenia. Degenerative changes of the sacroiliac joints. Hyperostosis. Electronically Signed   By: Karen Kays M.D.   On: 07/28/2022 16:42   DG Tibia/Fibula Left  Result Date: 07/28/2022 CLINICAL DATA:  Pain after fall EXAM: LEFT TIBIA AND FIBULA - 2 VIEW COMPARISON:  None Available. FINDINGS: No fracture or dislocation. Preserved adjacent joint spaces. There is chondrocalcinosis about the medial and lateral compartments of the knee. Osteopenia. IMPRESSION: No acute osseous abnormality.  Osteopenia.  Chondrocalcinosis. Electronically Signed   By: Karen Kays M.D.   On: 07/28/2022 16:41   DG Forearm Right  Result Date: 07/28/2022 CLINICAL DATA:  Pain after fall EXAM: RIGHT FOREARM - 2 VIEW; RIGHT HAND - COMPLETE 3 VIEW COMPARISON:  None Available. FINDINGS: Osteopenia. No fracture or dislocation. Chondrocalcinosis about the triangular fibrocartilage of the wrist. Scattered degenerative changes of the hand including joint space loss and small osteophytes of the distal interphalangeal joints diffusely IMPRESSION: Osteopenia.  Chondrocalcinosis about the wrist. Degenerative changes of the hand particularly the distal interphalangeal joints. Electronically Signed   By: Karen Kays M.D.   On: 07/28/2022 16:40   DG Hand Complete Right  Result Date: 07/28/2022 CLINICAL DATA:  Pain after fall EXAM: RIGHT FOREARM - 2 VIEW; RIGHT HAND - COMPLETE 3 VIEW COMPARISON:  None Available. FINDINGS: Osteopenia. No fracture or dislocation. Chondrocalcinosis about the triangular fibrocartilage of the wrist. Scattered degenerative changes of the hand including joint space loss and small osteophytes of the distal interphalangeal joints diffusely IMPRESSION: Osteopenia.  Chondrocalcinosis about the wrist. Degenerative changes of the hand particularly the distal interphalangeal joints. Electronically Signed   By: Karen Kays M.D.   On: 07/28/2022 16:40   DG Forearm Left  Result Date:  07/28/2022 CLINICAL DATA:  Pain after fall EXAM: LEFT FOREARM -  2 VIEW COMPARISON:  None Available. FINDINGS: Osteopenia. No fracture or dislocation. Preserved adjacent joint spaces. IMPRESSION: No acute osseous abnormality.  Osteopenia Electronically Signed   By: Karen Kays M.D.   On: 07/28/2022 16:38    Procedures .Critical Care  Performed by: Netta Corrigan, PA-C Authorized by: Netta Corrigan, PA-C   Critical care provider statement:    Critical care time (minutes):  30   Critical care time was exclusive of:  Separately billable procedures and treating other patients   Critical care was necessary to treat or prevent imminent or life-threatening deterioration of the following conditions:  Sepsis   Critical care was time spent personally by me on the following activities:  Blood draw for specimens, development of treatment plan with patient or surrogate, discussions with consultants, evaluation of patient's response to treatment, examination of patient, obtaining history from patient or surrogate, review of old charts, re-evaluation of patient's condition, pulse oximetry, ordering and review of radiographic studies, ordering and review of laboratory studies and ordering and performing treatments and interventions   I assumed direction of critical care for this patient from another provider in my specialty: no     Care discussed with: admitting provider       Medications Ordered in ED Medications  bacitracin ointment (has no administration in time range)  lactated ringers infusion (has no administration in time range)  cefTRIAXone (ROCEPHIN) 2 g in sodium chloride 0.9 % 100 mL IVPB (has no administration in time range)  lactated ringers bolus 1,000 mL (has no administration in time range)    And  lactated ringers bolus 500 mL (has no administration in time range)    ED Course/ Medical Decision Making/ A&P                             Medical Decision Making Amount and/or  Complexity of Data Reviewed Labs: ordered. Radiology: ordered.   Allison Jimenez 72 y.o. presented today for fall. Working DDx that I considered at this time includes, but not limited to, vasovagal episode, mechanical fall, ICH, epidural/subdural hematoma, basilar skull fracture, anemia, electrolyte abnormalities, drug-induced, arrhythmia, UTI, fracture, sepsis.  R/o DDx: vasovagal episode, mechanical fall, ICH, epidural/subdural hematoma, basilar skull fracture, anemia, electrolyte abnormalities, drug-induced, arrhythmia, UTI, fracture: These are considered less likely due to history of present illness and physical exam findings  Review of prior external notes: 07/25/2022 discharge summary  Unique Tests and My Interpretation:  CT head without contrast: No acute changes CT cervical spine without contrast: No acute changes Left forearm x-ray:No acute changes Right forearm x-ray:No acute changes Right hand x-ray:No acute changes Left tib-fib x-ray:No acute changes Pelvic x-ray:No acute changes CBC: Leukocytosis 15.9 BMP: Hypokalemia 2.8 EKG: Sinus tachycardia 123 bpm, no ST elevations or depressions noted, no blocks noted UA: unremarkable  Discussion with Independent Historian:  Sister  Discussion of Management of Tests:  Shalhoub, MD Hospitalist  Risk: High: hospitalization or escalation of hospital-level care  Risk Stratification Score: None  Staffed with Tegeler, MD  Plan: On exam patient was in no acute distress.  On exam patient had multiple skin tears along with hematoma to right forehead.  Patient was alert and oriented to person only and does appear slightly confused.  Patient's friend at bedside states that patient has been confused since having the UTI.  Outside of hematoma and skin tears patient's physical exam was unremarkable however given patient's age and repeated falls CT head  and neck were ordered along with multiple x-rays.  Patient stable this time.  I spoke to  the attending and with the patient actively altered and having falls with recent UTI requiring admission patient is not safe to be discharged and will be admitted.  Imaging was reassuring and skin tears will be repaired.  Hospitalist consulted and recommended touching base once the CBC, urine, BMP, back.  Patient did become tachycardic at 126 and with her labs she is hypokalemic at 2.8 along with CBC leukocytosis 15.9.  I spoke to the hospitalist and he recommended starting sepsis workup on her given her altered mental status and leukocytosis.  Sepsis was initiated and patient was given 2 g of Rocephin at the hospitalist request.  Patient will be given potassium as well.    Patient signed out to Tegeler, MD with the plan to admit. Hospitalist stated he would come see patient and as of now just awaiting CXR.         Final Clinical Impression(s) / ED Diagnoses Final diagnoses:  Altered mental status, unspecified altered mental status type  Fall, initial encounter  Hypokalemia  Sepsis, due to unspecified organism, unspecified whether acute organ dysfunction present [facility])    Rx / DC Orders ED Discharge Orders     None         Remi Deter 07/28/22 2015    Tegeler, Canary Brim, MD 07/28/22 2208

## 2022-07-28 NOTE — ED Triage Notes (Signed)
Pt arrived POV with sister. Patient fell while getting out of bed this morning. Hit right side of head on dresser. Pt has bruising, and hematoma to right side of head. Denies blood thinner, denies LOC. BLE skin tears to upper extremity. Patient AAOx4, Nad noted in triage. MAE, rep even and unlabored.

## 2022-07-28 NOTE — ED Notes (Signed)
Pt incontinent of stool brief and linen changed.

## 2022-07-29 ENCOUNTER — Inpatient Hospital Stay (HOSPITAL_COMMUNITY): Payer: Medicare PPO | Admitting: Anesthesiology

## 2022-07-29 ENCOUNTER — Inpatient Hospital Stay (HOSPITAL_COMMUNITY): Payer: Medicare PPO

## 2022-07-29 ENCOUNTER — Encounter (HOSPITAL_COMMUNITY)
Admission: EM | Disposition: A | Discharge status: Home Health | Payer: Self-pay | Source: Home / Self Care | Attending: Internal Medicine

## 2022-07-29 DIAGNOSIS — N201 Calculus of ureter: Secondary | ICD-10-CM | POA: Diagnosis not present

## 2022-07-29 DIAGNOSIS — J449 Chronic obstructive pulmonary disease, unspecified: Secondary | ICD-10-CM | POA: Diagnosis not present

## 2022-07-29 DIAGNOSIS — I10 Essential (primary) hypertension: Secondary | ICD-10-CM

## 2022-07-29 DIAGNOSIS — F17210 Nicotine dependence, cigarettes, uncomplicated: Secondary | ICD-10-CM

## 2022-07-29 DIAGNOSIS — G9341 Metabolic encephalopathy: Secondary | ICD-10-CM | POA: Diagnosis not present

## 2022-07-29 DIAGNOSIS — A419 Sepsis, unspecified organism: Secondary | ICD-10-CM | POA: Diagnosis not present

## 2022-07-29 HISTORY — PX: CYSTOSCOPY W/ URETERAL STENT PLACEMENT: SHX1429

## 2022-07-29 LAB — RESP PANEL BY RT-PCR (RSV, FLU A&B, COVID)  RVPGX2
Influenza A by PCR: NEGATIVE
Influenza B by PCR: NEGATIVE
Resp Syncytial Virus by PCR: NEGATIVE
SARS Coronavirus 2 by RT PCR: NEGATIVE

## 2022-07-29 LAB — C DIFFICILE QUICK SCREEN W PCR REFLEX
C Diff antigen: NEGATIVE
C Diff interpretation: NOT DETECTED
C Diff toxin: NEGATIVE

## 2022-07-29 LAB — CBC WITH DIFFERENTIAL/PLATELET
Abs Immature Granulocytes: 0.13 10*3/uL — ABNORMAL HIGH (ref 0.00–0.07)
Basophils Absolute: 0.1 10*3/uL (ref 0.0–0.1)
Basophils Relative: 1 %
Eosinophils Absolute: 0.2 10*3/uL (ref 0.0–0.5)
Eosinophils Relative: 1 %
HCT: 35.3 % — ABNORMAL LOW (ref 36.0–46.0)
Hemoglobin: 11.3 g/dL — ABNORMAL LOW (ref 12.0–15.0)
Immature Granulocytes: 1 %
Lymphocytes Relative: 21 %
Lymphs Abs: 3.1 10*3/uL (ref 0.7–4.0)
MCH: 28.8 pg (ref 26.0–34.0)
MCHC: 32 g/dL (ref 30.0–36.0)
MCV: 89.8 fL (ref 80.0–100.0)
Monocytes Absolute: 1.3 10*3/uL — ABNORMAL HIGH (ref 0.1–1.0)
Monocytes Relative: 9 %
Neutro Abs: 9.7 10*3/uL — ABNORMAL HIGH (ref 1.7–7.7)
Neutrophils Relative %: 67 %
Platelets: 495 10*3/uL — ABNORMAL HIGH (ref 150–400)
RBC: 3.93 MIL/uL (ref 3.87–5.11)
RDW: 15.3 % (ref 11.5–15.5)
WBC: 14.5 10*3/uL — ABNORMAL HIGH (ref 4.0–10.5)
nRBC: 0 % (ref 0.0–0.2)

## 2022-07-29 LAB — PROTIME-INR
INR: 1.1 (ref 0.8–1.2)
Prothrombin Time: 14.3 s (ref 11.4–15.2)

## 2022-07-29 LAB — COMPREHENSIVE METABOLIC PANEL
ALT: 19 U/L (ref 0–44)
AST: 16 U/L (ref 15–41)
Albumin: 2.1 g/dL — ABNORMAL LOW (ref 3.5–5.0)
Alkaline Phosphatase: 161 U/L — ABNORMAL HIGH (ref 38–126)
Anion gap: 12 (ref 5–15)
BUN: <5 mg/dL — ABNORMAL LOW (ref 8–23)
CO2: 22 mmol/L (ref 22–32)
Calcium: 7.4 mg/dL — ABNORMAL LOW (ref 8.9–10.3)
Chloride: 97 mmol/L — ABNORMAL LOW (ref 98–111)
Creatinine, Ser: 0.41 mg/dL — ABNORMAL LOW (ref 0.44–1.00)
GFR, Estimated: >60 mL/min (ref >60)
Glucose, Bld: 80 mg/dL (ref 70–99)
Potassium: 3.1 mmol/L — ABNORMAL LOW (ref 3.5–5.1)
Sodium: 131 mmol/L — ABNORMAL LOW (ref 135–145)
Total Bilirubin: 0.8 mg/dL (ref 0.3–1.2)
Total Protein: 5.6 g/dL — ABNORMAL LOW (ref 6.5–8.1)

## 2022-07-29 LAB — APTT: aPTT: 47 s — ABNORMAL HIGH (ref 24–36)

## 2022-07-29 LAB — C-REACTIVE PROTEIN: CRP: 7.5 mg/dL — ABNORMAL HIGH (ref <1.0)

## 2022-07-29 LAB — MAGNESIUM: Magnesium: 1.3 mg/dL — ABNORMAL LOW (ref 1.7–2.4)

## 2022-07-29 LAB — TSH: TSH: 7.922 u[IU]/mL — ABNORMAL HIGH (ref 0.350–4.500)

## 2022-07-29 SURGERY — CYSTOSCOPY, WITH RETROGRADE PYELOGRAM AND URETERAL STENT INSERTION
Anesthesia: General | Site: Ureter | Laterality: Right

## 2022-07-29 MED ORDER — LIDOCAINE 2% (20 MG/ML) 5 ML SYRINGE
INTRAMUSCULAR | Status: DC | PRN
  Administered 2022-07-29: 100 mg via INTRAVENOUS

## 2022-07-29 MED ORDER — POTASSIUM CHLORIDE 10 MEQ/100ML IV SOLN
10.0000 meq | INTRAVENOUS | Status: AC
  Administered 2022-07-29 (×4): 10 meq via INTRAVENOUS
  Filled 2022-07-29 (×4): qty 100

## 2022-07-29 MED ORDER — ACETAMINOPHEN 10 MG/ML IV SOLN
INTRAVENOUS | Status: AC
  Filled 2022-07-29: qty 100

## 2022-07-29 MED ORDER — NICOTINE 21 MG/24HR TD PT24
21.0000 mg | MEDICATED_PATCH | Freq: Every day | TRANSDERMAL | Status: DC
  Administered 2022-07-29 – 2022-07-31 (×3): 21 mg via TRANSDERMAL
  Filled 2022-07-29 (×3): qty 1

## 2022-07-29 MED ORDER — DEXAMETHASONE SODIUM PHOSPHATE 10 MG/ML IJ SOLN
INTRAMUSCULAR | Status: AC
  Filled 2022-07-29: qty 1

## 2022-07-29 MED ORDER — SODIUM CHLORIDE 0.9 % IV SOLN: INTRAVENOUS | Status: DC

## 2022-07-29 MED ORDER — AMISULPRIDE (ANTIEMETIC) 5 MG/2ML IV SOLN: 10.0000 mg | Freq: Once | INTRAVENOUS | Status: DC | PRN

## 2022-07-29 MED ORDER — DEXAMETHASONE SODIUM PHOSPHATE 10 MG/ML IJ SOLN
INTRAMUSCULAR | Status: DC | PRN
  Administered 2022-07-29: 8 mg via INTRAVENOUS

## 2022-07-29 MED ORDER — FENTANYL CITRATE (PF) 100 MCG/2ML IJ SOLN
INTRAMUSCULAR | Status: AC
  Filled 2022-07-29: qty 2

## 2022-07-29 MED ORDER — LACTATED RINGERS IV SOLN
INTRAVENOUS | Status: DC | PRN
Start: 2022-07-29 — End: 2022-07-29

## 2022-07-29 MED ORDER — ONDANSETRON HCL 4 MG/2ML IJ SOLN
INTRAMUSCULAR | Status: AC
  Filled 2022-07-29: qty 2

## 2022-07-29 MED ORDER — FENTANYL CITRATE PF 50 MCG/ML IJ SOSY: 25.0000 ug | PREFILLED_SYRINGE | INTRAMUSCULAR | Status: DC | PRN

## 2022-07-29 MED ORDER — ACETAMINOPHEN 10 MG/ML IV SOLN: 1000.0000 mg | Freq: Once | INTRAVENOUS | Status: DC | PRN

## 2022-07-29 MED ORDER — PROPOFOL 10 MG/ML IV BOLUS
INTRAVENOUS | Status: DC | PRN
  Administered 2022-07-29: 80 mg via INTRAVENOUS

## 2022-07-29 MED ORDER — POLYVINYL ALCOHOL 1.4 % OP SOLN
1.0000 [drp] | OPHTHALMIC | Status: DC | PRN
  Filled 2022-07-29: qty 15

## 2022-07-29 MED ORDER — IOHEXOL 300 MG/ML  SOLN
INTRAMUSCULAR | Status: DC | PRN
  Administered 2022-07-29: 5 mL

## 2022-07-29 MED ORDER — OXYCODONE HCL 5 MG PO TABS: 5.0000 mg | ORAL_TABLET | Freq: Once | ORAL | Status: DC | PRN

## 2022-07-29 MED ORDER — ACETAMINOPHEN 10 MG/ML IV SOLN
INTRAVENOUS | Status: DC | PRN
  Administered 2022-07-29: 1000 mg via INTRAVENOUS

## 2022-07-29 MED ORDER — KETOROLAC TROMETHAMINE 30 MG/ML IJ SOLN
INTRAMUSCULAR | Status: AC
  Filled 2022-07-29: qty 1

## 2022-07-29 MED ORDER — IOHEXOL 300 MG/ML  SOLN
80.0000 mL | Freq: Once | INTRAMUSCULAR | Status: AC | PRN
  Administered 2022-07-29: 80 mL via INTRAVENOUS

## 2022-07-29 MED ORDER — ONDANSETRON HCL 4 MG/2ML IJ SOLN
INTRAMUSCULAR | Status: DC | PRN
  Administered 2022-07-29: 4 mg via INTRAVENOUS

## 2022-07-29 MED ORDER — OXYCODONE HCL 5 MG/5ML PO SOLN: 5.0000 mg | Freq: Once | ORAL | Status: DC | PRN

## 2022-07-29 MED ORDER — PROPOFOL 10 MG/ML IV BOLUS
INTRAVENOUS | Status: AC
  Filled 2022-07-29: qty 20

## 2022-07-29 MED ORDER — POTASSIUM CHLORIDE CRYS ER 20 MEQ PO TBCR
40.0000 meq | EXTENDED_RELEASE_TABLET | ORAL | Status: DC
  Filled 2022-07-29: qty 2

## 2022-07-29 MED ORDER — FENTANYL CITRATE (PF) 100 MCG/2ML IJ SOLN
INTRAMUSCULAR | Status: DC | PRN
  Administered 2022-07-29: 25 ug via INTRAVENOUS

## 2022-07-29 MED ORDER — DOXYCYCLINE HYCLATE 100 MG PO TABS
100.0000 mg | ORAL_TABLET | Freq: Two times a day (BID) | ORAL | Status: DC
  Administered 2022-07-29 – 2022-07-31 (×4): 100 mg via ORAL
  Filled 2022-07-29 (×5): qty 1

## 2022-07-29 MED ORDER — STERILE WATER FOR IRRIGATION IR SOLN
Status: DC | PRN
  Administered 2022-07-29: 3000 mL

## 2022-07-29 MED ORDER — MAGNESIUM SULFATE 2 GM/50ML IV SOLN
2.0000 g | Freq: Once | INTRAVENOUS | Status: AC
  Administered 2022-07-29: 2 g via INTRAVENOUS
  Filled 2022-07-29: qty 50

## 2022-07-29 MED ORDER — SODIUM CHLORIDE (PF) 0.9 % IJ SOLN
INTRAMUSCULAR | Status: AC
  Filled 2022-07-29: qty 50

## 2022-07-29 MED ORDER — KETOROLAC TROMETHAMINE 30 MG/ML IJ SOLN
INTRAMUSCULAR | Status: DC | PRN
  Administered 2022-07-29: 15 mg via INTRAVENOUS

## 2022-07-29 SURGICAL SUPPLY — 11 items
BAG URO CATCHER STRL LF (MISCELLANEOUS) ×1 IMPLANT
CATH URETL OPEN END 6FR 70 (CATHETERS) ×1 IMPLANT
CLOTH BEACON ORANGE TIMEOUT ST (SAFETY) ×1 IMPLANT
GLOVE SURG LX STRL 8.0 MICRO (GLOVE) ×1 IMPLANT
GOWN STRL REUS W/ TWL XL LVL3 (GOWN DISPOSABLE) ×2 IMPLANT
GOWN STRL REUS W/TWL XL LVL3 (GOWN DISPOSABLE) ×2
GUIDEWIRE STR DUAL SENSOR (WIRE) ×1 IMPLANT
MANIFOLD NEPTUNE II (INSTRUMENTS) ×1 IMPLANT
PACK CYSTO (CUSTOM PROCEDURE TRAY) ×1 IMPLANT
STENT URET 6FRX24 CONTOUR (STENTS) IMPLANT
TUBING CONNECTING 10 (TUBING) ×1 IMPLANT

## 2022-07-29 NOTE — Assessment & Plan Note (Signed)
Increasing weakness in the past several days with at least 2 falls with one fall this morning prompting the patient's presentation here today Falls likely secondary to increasing weakness from underlying infection Trauma survey here in the emergency department reveals no evidence of fracture. Once patient is clinically improved we will obtain PT evaluation

## 2022-07-29 NOTE — Assessment & Plan Note (Signed)
Urine culture on 7/24 growing out Klebsiella Placing on intravenous ceftriaxone for now Obtaining repeat urine and blood cultures Patient complaining of abdominal pain, will obtain CT imaging of the abdomen and pelvis

## 2022-07-29 NOTE — Anesthesia Procedure Notes (Signed)
Procedure Name: LMA Insertion Date/Time: 07/29/2022 1:33 PM  Performed by: Elisabeth Cara, CRNAPre-anesthesia Checklist: Patient identified, Emergency Drugs available, Suction available, Patient being monitored and Timeout performed Patient Re-evaluated:Patient Re-evaluated prior to induction Oxygen Delivery Method: Circle system utilized Preoxygenation: Pre-oxygenation with 100% oxygen Induction Type: IV induction LMA: LMA with gastric port inserted LMA Size: 4.0 Number of attempts: 1 Placement Confirmation: positive ETCO2 and breath sounds checked- equal and bilateral Tube secured with: Tape Dental Injury: Teeth and Oropharynx as per pre-operative assessment

## 2022-07-29 NOTE — Assessment & Plan Note (Signed)
Patient lethargic with periods of confusion and evidence of disorientation and confusion during the interview.   Patient likely encephalopathic secondary to underlying sepsis  Treating underlying infection with intravenous fluids and intravenous antibiotics Monitoring for symptomatic improvement

## 2022-07-29 NOTE — Consult Note (Signed)
Urology Consult  Referring physician: Dr. Leafy Half Reason for referral: right ureteral calculus  Chief Complaint: abdominal pain  History of Present Illness: Allison Jimenez is a 72yo with a history nephrolithiasis who was admitted with worsening abdominal pain and confusion. She was admitted for a UTI 7/23 and discharge on 7/24. She continued to have abdominal pain and altered mental status and presented to the ER. She underwent CT this morning which showed a right 3mm distal ureteral calculus. She denies nay fevers. She does have urinary frequency and urgency.   Past Medical History:  Diagnosis Date   Acute blood loss anemia (ABLA) 09/05/2020   Allergy    ANXIETY 06/05/2006   Arthritis    SHOULDERS    BACK PAIN 01/05/2010   Cataract    bilateral, left worse   Centrilobular emphysema (HCC)    COPD (chronic obstructive pulmonary disease) (HCC)    Depression    Diabetes mellitus without complication (HCC)    no meds, diet controllled   DIVERTICULOSIS, COLON 06/05/2006   DIZZINESS OR VERTIGO 06/05/2006   positional   Hearing loss in left ear    no hearing aid   History of blood transfusion 08/2020   History of kidney stones    passed stones   HYPERLIPIDEMIA 06/05/2006   Hypertension    HYPOKALEMIA 12/14/2008   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   Qualifier: Diagnosis of  By: Amador Cunas  MD, Janett Labella    Migraine    TOBACCO USER 11/25/2007   Past Surgical History:  Procedure Laterality Date   APPENDECTOMY     BRONCHIAL BIOPSY  09/28/2021   Procedure: BRONCHIAL BIOPSIES;  Surgeon: Omar Person, MD;  Location: [facility] ENDOSCOPY;  Service: Pulmonary;;   BRONCHIAL NEEDLE ASPIRATION BIOPSY  09/28/2021   Procedure: BRONCHIAL NEEDLE ASPIRATION BIOPSIES;  Surgeon: Omar Person, MD;  Location: [facility] ENDOSCOPY;  Service: Pulmonary;;   BRONCHIAL WASHINGS  09/28/2021   Procedure: BRONCHIAL WASHINGS;  Surgeon: Omar Person, MD;  Location: [facility] ENDOSCOPY;  Service: Pulmonary;;   CESAREAN  SECTION     x1   CHOLECYSTECTOMY     COLONOSCOPY     COSMETIC SURGERY     mastoid surgery  1992   shunt in mastoid- endolymphatic sac in left ear    MRI  07/11/2020   x several, last one located in CE   TUBAL LIGATION     UPPER GI ENDOSCOPY     vocal cord surgery     nodule x2    Medications: I have reviewed the patient's current medications. Allergies:  Allergies  Allergen Reactions   Erythromycin Base Diarrhea   Vfend [Voriconazole] Other (See Comments)    Visual changes   Penicillins Rash    Family History  Problem Relation Age of Onset   Breast cancer Mother    Migraines Other    Depression Other    Anxiety disorder Other    High Cholesterol Other    Colon cancer Neg Hx    Rectal cancer Neg Hx    Stomach cancer Neg Hx    Colon polyps Neg Hx    Esophageal cancer Neg Hx    Social History:  reports that she has been smoking cigarettes. She has a 24.5 pack-year smoking history. She has never used smokeless tobacco. She reports that she does not drink alcohol and does not use drugs.  Review of Systems  Genitourinary:  Positive for flank pain.  All other systems reviewed and are negative.   Physical Exam:  Vital  signs in last 24 hours: Temp:  [98.2 F (36.8 C)-98.9 F (37.2 C)] 98.5 F (36.9 C) (07/28 1136) Pulse Rate:  [78-139] 95 (07/28 1136) Resp:  [12-21] 16 (07/28 1136) BP: (116-163)/(73-104) 143/80 (07/28 1136) SpO2:  [94 %-100 %] 99 % (07/28 1136) Weight:  [47 kg] 47 kg (07/27 1329) Physical Exam Vitals reviewed.  Constitutional:      Appearance: Normal appearance.  HENT:     Head: Normocephalic and atraumatic.  Eyes:     Extraocular Movements: Extraocular movements intact.     Pupils: Pupils are equal, round, and reactive to light.  Cardiovascular:     Rate and Rhythm: Normal rate and regular rhythm.  Pulmonary:     Effort: Pulmonary effort is normal. No respiratory distress.  Abdominal:     General: Abdomen is flat. There is no distension.   Musculoskeletal:        General: No swelling. Normal range of motion.     Cervical back: Normal range of motion and neck supple.  Skin:    General: Skin is warm and dry.  Neurological:     General: No focal deficit present.     Mental Status: She is alert and oriented to person, place, and time.  Psychiatric:        Mood and Affect: Mood normal.        Behavior: Behavior normal.        Thought Content: Thought content normal.        Judgment: Judgment normal.     Laboratory Data:  Results for orders placed or performed during the hospital encounter of 07/28/22 (from the past 72 hour(s))  Basic metabolic panel     Status: Abnormal   Collection Time: 07/28/22  6:45 PM  Result Value Ref Range   Sodium 131 (L) 135 - 145 mmol/L   Potassium 2.8 (L) 3.5 - 5.1 mmol/L   Chloride 98 98 - 111 mmol/L   CO2 22 22 - 32 mmol/L   Glucose, Bld 88 70 - 99 mg/dL    Comment: Glucose reference range applies only to samples taken after fasting for at least 8 hours.   BUN <5 (L) 8 - 23 mg/dL   Creatinine, Ser 0.16 0.44 - 1.00 mg/dL   Calcium 7.4 (L) 8.9 - 10.3 mg/dL   GFR, Estimated >01 >09 mL/min    Comment: (NOTE) Calculated using the CKD-EPI Creatinine Equation (2021)    Anion gap 11 5 - 15    Comment: Performed at [facility], [address], [place], [place] [address]  CBC with Differential     Status: Abnormal   Collection Time: 07/28/22  6:45 PM  Result Value Ref Range   WBC 15.9 (H) 4.0 - 10.5 K/uL   RBC 3.91 3.87 - 5.11 MIL/uL   Hemoglobin 11.3 (L) 12.0 - 15.0 g/dL   HCT 73.2 (L) 20.2 - 54.2 %   MCV 88.7 80.0 - 100.0 fL   MCH 28.9 26.0 - 34.0 pg   MCHC 32.6 30.0 - 36.0 g/dL   RDW 70.6 23.7 - 62.8 %   Platelets 508 (H) 150 - 400 K/uL   nRBC 0.0 0.0 - 0.2 %   Neutrophils Relative % 73 %   Neutro Abs 11.5 (H) 1.7 - 7.7 K/uL   Lymphocytes Relative 18 %   Lymphs Abs 2.9 0.7 - 4.0 K/uL   Monocytes Relative 8 %   Monocytes Absolute 1.2 (H) 0.1 - 1.0 K/uL    Eosinophils Relative  0 %   Eosinophils Absolute 0.0 0.0 - 0.5 K/uL   Basophils Relative 0 %   Basophils Absolute 0.1 0.0 - 0.1 K/uL   Immature Granulocytes 1 %   Abs Immature Granulocytes 0.21 (H) 0.00 - 0.07 K/uL    Comment: Performed at [facility], [address], [place], [place] [address]  Urinalysis, Routine w reflex microscopic -Urine, Clean Catch     Status: Abnormal   Collection Time: 07/28/22  6:50 PM  Result Value Ref Range   Color, Urine YELLOW YELLOW   APPearance CLEAR CLEAR   Specific Gravity, Urine 1.011 1.005 - 1.030   pH 6.0 5.0 - 8.0   Glucose, UA NEGATIVE NEGATIVE mg/dL   Hgb urine dipstick NEGATIVE NEGATIVE   Bilirubin Urine NEGATIVE NEGATIVE   Ketones, ur 5 (A) NEGATIVE mg/dL   Protein, ur 604 (A) NEGATIVE mg/dL   Nitrite NEGATIVE NEGATIVE   Leukocytes,Ua NEGATIVE NEGATIVE   RBC / HPF 0-5 0 - 5 RBC/hpf   WBC, UA 0-5 0 - 5 WBC/hpf   Bacteria, UA NONE SEEN NONE SEEN   Squamous Epithelial / HPF 0-5 0 - 5 /HPF   Mucus PRESENT    Hyaline Casts, UA PRESENT     Comment: Performed at [facility], [address] [place], [place] [address]  Culture, blood (Routine X 2) w Reflex to ID Panel     Status: None (Preliminary result)   Collection Time: 07/28/22  7:40 PM   Specimen: BLOOD RIGHT ARM  Result Value Ref Range   Specimen Description      BLOOD RIGHT ARM BOTTLES DRAWN AEROBIC AND ANAEROBIC Performed at [facility], [address], [place], [place] [address]    Special Requests      Blood Culture adequate volume Performed at [facility], [address], [place], [place] [address]    Culture      NO GROWTH < 12 HOURS Performed at [facility], [address], [place], [place] [address]    Report Status PENDING   Lactic acid, plasma     Status: None   Collection Time: 07/28/22  7:40 PM  Result Value Ref Range   Lactic Acid, Venous 0.9 0.5 - 1.9 mmol/L    Comment:  Performed at [facility], [address], [place], [place] [address]  Magnesium     Status: Abnormal   Collection Time: 07/28/22  8:00 PM  Result Value Ref Range   Magnesium 1.3 (L) 1.7 - 2.4 mg/dL    Comment: Performed at [facility], [address], [place], [place] [address]  Culture, blood (Routine X 2) w Reflex to ID Panel     Status: None (Preliminary result)   Collection Time: 07/28/22  8:00 PM   Specimen: Right Antecubital; Blood  Result Value Ref Range   Specimen Description      RIGHT ANTECUBITAL BOTTLES DRAWN AEROBIC AND ANAEROBIC Performed at [facility], [address], [place], [place] [address]    Special Requests      Blood Culture adequate volume Performed at [facility], [address], [place], [place] [address]    Culture      NO GROWTH < 12 HOURS Performed at [facility], [address], [place], [place] [address]    Report Status PENDING   Procalcitonin     Status: None   Collection Time: 07/28/22  8:00 PM  Result Value Ref Range   Procalcitonin 5.74 ng/mL    Comment:        Interpretation: PCT > 2 ng/mL: Systemic infection (sepsis) is likely, unless other causes are known. (NOTE)       Sepsis PCT Algorithm           Lower Respiratory Tract                                      Infection PCT Algorithm    ----------------------------     ----------------------------         PCT < 0.25 ng/mL                PCT < 0.10 ng/mL          Strongly encourage             Strongly discourage   discontinuation of antibiotics    initiation of antibiotics    ----------------------------     -----------------------------       PCT 0.25 - 0.50 ng/mL            PCT 0.10 - 0.25 ng/mL               OR       >80% decrease in PCT            Discourage initiation of                                            antibiotics      Encourage discontinuation           of antibiotics     ----------------------------     -----------------------------         PCT >= 0.50 ng/mL              PCT 0.26 - 0.50 ng/mL               AND       <80% decrease in PCT              Encourage initiation of                                             antibiotics       Encourage continuation           of antibiotics    ----------------------------     -----------------------------        PCT >= 0.50 ng/mL                  PCT > 0.50 ng/mL               AND         increase in PCT                  Strongly encourage                                      initiation of antibiotics    Strongly encourage escalation  of antibiotics                                     -----------------------------                                           PCT <= 0.25 ng/mL                                                 OR                                        > 80% decrease in PCT                                      Discontinue / Do not initiate                                             antibiotics  Performed at [facility], [address] [place], [place] [address]   Resp panel by RT-PCR (RSV, Flu A&B, Covid) Anterior Nasal Swab     Status: None   Collection Time: 07/29/22  1:18 AM   Specimen: Anterior Nasal Swab  Result Value Ref Range   SARS Coronavirus 2 by RT PCR NEGATIVE NEGATIVE    Comment: (NOTE) SARS-CoV-2 target nucleic acids are NOT DETECTED.  The SARS-CoV-2 RNA is generally detectable in upper respiratory specimens during the acute phase of infection. The lowest concentration of SARS-CoV-2 viral copies this assay can detect is 138 copies/mL. A negative result does not preclude SARS-Cov-2 infection and should not be used as the sole basis for treatment or other patient management decisions. A negative result may occur with  improper specimen collection/handling, submission of specimen other than nasopharyngeal swab, presence of viral mutation(s) within  the areas targeted by this assay, and inadequate number of viral copies(<138 copies/mL). A negative result must be combined with clinical observations, patient history, and epidemiological information. The expected result is Negative.  Fact Sheet for Patients:  BloggerCourse.com  Fact Sheet for Healthcare Providers:  SeriousBroker.it  This test is no t yet approved or cleared by the [place] FDA and  has been authorized for detection and/or diagnosis of SARS-CoV-2 by FDA under an Emergency Use Authorization (EUA). This EUA will remain  in effect (meaning this test can be used) for the duration of the COVID-19 declaration under Section 564(b)(1) of the Act, 21 U.S.C.section 360bbb-3(b)(1), unless the authorization is terminated  or revoked sooner.       Influenza A by PCR NEGATIVE NEGATIVE   Influenza B by PCR NEGATIVE NEGATIVE    Comment: (NOTE) The Xpert Xpress SARS-CoV-2/FLU/RSV plus assay is intended as an aid in the diagnosis of influenza from Nasopharyngeal swab specimens and should not be used as a sole basis for treatment. Nasal washings and aspirates are unacceptable for Xpert  Xpress SARS-CoV-2/FLU/RSV testing.  Fact Sheet for Patients: BloggerCourse.com  Fact Sheet for Healthcare Providers: SeriousBroker.it  This test is not yet approved or cleared by the [place] FDA and has been authorized for detection and/or diagnosis of SARS-CoV-2 by FDA under an Emergency Use Authorization (EUA). This EUA will remain in effect (meaning this test can be used) for the duration of the COVID-19 declaration under Section 564(b)(1) of the Act, 21 U.S.C. section 360bbb-3(b)(1), unless the authorization is terminated or revoked.     Resp Syncytial Virus by PCR NEGATIVE NEGATIVE    Comment: (NOTE) Fact Sheet for Patients: BloggerCourse.com  Fact  Sheet for Healthcare Providers: SeriousBroker.it  This test is not yet approved or cleared by the [place] FDA and has been authorized for detection and/or diagnosis of SARS-CoV-2 by FDA under an Emergency Use Authorization (EUA). This EUA will remain in effect (meaning this test can be used) for the duration of the COVID-19 declaration under Section 564(b)(1) of the Act, 21 U.S.C. section 360bbb-3(b)(1), unless the authorization is terminated or revoked.  Performed at [facility], [address], [place], [place] [address]   C-reactive protein     Status: Abnormal   Collection Time: 07/29/22  4:06 AM  Result Value Ref Range   CRP 7.5 (H) <1.0 mg/dL    Comment: Performed at [facility], [address] [place], [place] [address]  APTT     Status: Abnormal   Collection Time: 07/29/22  4:06 AM  Result Value Ref Range   aPTT 47 (H) 24 - 36 seconds    Comment:        IF BASELINE aPTT IS ELEVATED, SUGGEST PATIENT RISK ASSESSMENT BE USED TO DETERMINE APPROPRIATE ANTICOAGULANT THERAPY. Performed at [facility], [address], [place], [place] [address]   Protime-INR     Status: None   Collection Time: 07/29/22  4:06 AM  Result Value Ref Range   Prothrombin Time 14.3 11.4 - 15.2 seconds   INR 1.1 0.8 - 1.2    Comment: (NOTE) INR goal varies based on device and disease states. Performed at [facility], [address], [place], [place] [address]   CBC with Differential/Platelet     Status: Abnormal   Collection Time: 07/29/22  4:06 AM  Result Value Ref Range   WBC 14.5 (H) 4.0 - 10.5 K/uL   RBC 3.93 3.87 - 5.11 MIL/uL   Hemoglobin 11.3 (L) 12.0 - 15.0 g/dL   HCT 18.8 (L) 41.6 - 60.6 %   MCV 89.8 80.0 - 100.0 fL   MCH 28.8 26.0 - 34.0 pg   MCHC 32.0 30.0 - 36.0 g/dL   RDW 30.1 60.1 - 09.3 %   Platelets 495 (H) 150 - 400 K/uL   nRBC 0.0 0.0 - 0.2 %   Neutrophils Relative % 67 %    Neutro Abs 9.7 (H) 1.7 - 7.7 K/uL   Lymphocytes Relative 21 %   Lymphs Abs 3.1 0.7 - 4.0 K/uL   Monocytes Relative 9 %   Monocytes Absolute 1.3 (H) 0.1 - 1.0 K/uL   Eosinophils Relative 1 %   Eosinophils Absolute 0.2 0.0 - 0.5 K/uL   Basophils Relative 1 %   Basophils Absolute 0.1 0.0 - 0.1 K/uL   Immature Granulocytes 1 %   Abs Immature Granulocytes 0.13 (H) 0.00 - 0.07 K/uL    Comment: Performed at [facility], [address], [place], [place] [address]  Comprehensive  metabolic panel     Status: Abnormal   Collection Time: 07/29/22  4:06 AM  Result Value Ref Range   Sodium 131 (L) 135 - 145 mmol/L   Potassium 3.1 (L) 3.5 - 5.1 mmol/L   Chloride 97 (L) 98 - 111 mmol/L   CO2 22 22 - 32 mmol/L   Glucose, Bld 80 70 - 99 mg/dL    Comment: Glucose reference range applies only to samples taken after fasting for at least 8 hours.   BUN <5 (L) 8 - 23 mg/dL   Creatinine, Ser 1.61 (L) 0.44 - 1.00 mg/dL   Calcium 7.4 (L) 8.9 - 10.3 mg/dL   Total Protein 5.6 (L) 6.5 - 8.1 g/dL   Albumin 2.1 (L) 3.5 - 5.0 g/dL   AST 16 15 - 41 U/L   ALT 19 0 - 44 U/L   Alkaline Phosphatase 161 (H) 38 - 126 U/L   Total Bilirubin 0.8 0.3 - 1.2 mg/dL   GFR, Estimated >09 >60 mL/min    Comment: (NOTE) Calculated using the CKD-EPI Creatinine Equation (2021)    Anion gap 12 5 - 15    Comment: Performed at [facility], [address], [place], [place] [address]  Magnesium     Status: Abnormal   Collection Time: 07/29/22  4:06 AM  Result Value Ref Range   Magnesium 1.3 (L) 1.7 - 2.4 mg/dL    Comment: Performed at [facility], [address] [place], [place] [address]  C Difficile Quick Screen w PCR reflex     Status: None   Collection Time: 07/29/22  9:44 AM   Specimen: STOOL  Result Value Ref Range   C Diff antigen NEGATIVE NEGATIVE   C Diff toxin NEGATIVE NEGATIVE   C Diff interpretation No C. difficile detected.     Comment: Performed at  [facility], [address], [place], [place] [address]   Recent Results (from the past 240 hour(s))  Resp panel by RT-PCR (RSV, Flu A&B, Covid) Anterior Nasal Swab     Status: None   Collection Time: 07/24/22  3:36 PM   Specimen: Anterior Nasal Swab  Result Value Ref Range Status   SARS Coronavirus 2 by RT PCR NEGATIVE NEGATIVE Final    Comment: (NOTE) SARS-CoV-2 target nucleic acids are NOT DETECTED.  The SARS-CoV-2 RNA is generally detectable in upper respiratory specimens during the acute phase of infection. The lowest concentration of SARS-CoV-2 viral copies this assay can detect is 138 copies/mL. A negative result does not preclude SARS-Cov-2 infection and should not be used as the sole basis for treatment or other patient management decisions. A negative result may occur with  improper specimen collection/handling, submission of specimen other than nasopharyngeal swab, presence of viral mutation(s) within the areas targeted by this assay, and inadequate number of viral copies(<138 copies/mL). A negative result must be combined with clinical observations, patient history, and epidemiological information. The expected result is Negative.  Fact Sheet for Patients:  BloggerCourse.com  Fact Sheet for Healthcare Providers:  SeriousBroker.it  This test is no t yet approved or cleared by the [place] FDA and  has been authorized for detection and/or diagnosis of SARS-CoV-2 by FDA under an Emergency Use Authorization (EUA). This EUA will remain  in effect (meaning this test can be used) for the duration of the COVID-19 declaration under Section 564(b)(1) of the Act, 21 U.S.C.section 360bbb-3(b)(1), unless the authorization is terminated  or revoked sooner.  Influenza A by PCR NEGATIVE NEGATIVE Final   Influenza B by PCR NEGATIVE NEGATIVE Final    Comment: (NOTE) The Xpert Xpress SARS-CoV-2/FLU/RSV  plus assay is intended as an aid in the diagnosis of influenza from Nasopharyngeal swab specimens and should not be used as a sole basis for treatment. Nasal washings and aspirates are unacceptable for Xpert Xpress SARS-CoV-2/FLU/RSV testing.  Fact Sheet for Patients: BloggerCourse.com  Fact Sheet for Healthcare Providers: SeriousBroker.it  This test is not yet approved or cleared by the [place] FDA and has been authorized for detection and/or diagnosis of SARS-CoV-2 by FDA under an Emergency Use Authorization (EUA). This EUA will remain in effect (meaning this test can be used) for the duration of the COVID-19 declaration under Section 564(b)(1) of the Act, 21 U.S.C. section 360bbb-3(b)(1), unless the authorization is terminated or revoked.     Resp Syncytial Virus by PCR NEGATIVE NEGATIVE Final    Comment: (NOTE) Fact Sheet for Patients: BloggerCourse.com  Fact Sheet for Healthcare Providers: SeriousBroker.it  This test is not yet approved or cleared by the [place] FDA and has been authorized for detection and/or diagnosis of SARS-CoV-2 by FDA under an Emergency Use Authorization (EUA). This EUA will remain in effect (meaning this test can be used) for the duration of the COVID-19 declaration under Section 564(b)(1) of the Act, 21 U.S.C. section 360bbb-3(b)(1), unless the authorization is terminated or revoked.  Performed at [facility], [address] [place], [place] [address]   Urine Culture     Status: Abnormal   Collection Time: 07/24/22  3:45 PM   Specimen: Urine, Clean Catch  Result Value Ref Range Status   Specimen Description   Final    URINE, CLEAN CATCH Performed at [facility], [address] [place], [place] [address]    Special Requests   Final    NONE Performed at [facility], [address], [place], [place] [address]    Culture   Final    Two isolates with different morphologies were identified as the same organism.The most resistant organism was reported. >=100,000 COLONIES/mL KLEBSIELLA PNEUMONIAE    Report Status 07/27/2022 FINAL  Final   Organism ID, Bacteria KLEBSIELLA PNEUMONIAE (A)  Final      Susceptibility   Klebsiella pneumoniae - MIC*    AMPICILLIN >=32 RESISTANT Resistant     CEFAZOLIN <=4 SENSITIVE Sensitive     CEFEPIME <=0.12 SENSITIVE Sensitive     CEFTRIAXONE <=0.25 SENSITIVE Sensitive     CIPROFLOXACIN <=0.25 SENSITIVE Sensitive     GENTAMICIN <=1 SENSITIVE Sensitive     IMIPENEM <=0.25 SENSITIVE Sensitive     NITROFURANTOIN 64 INTERMEDIATE Intermediate     TRIMETH/SULFA <=20 SENSITIVE Sensitive     AMPICILLIN/SULBACTAM 4 SENSITIVE Sensitive     PIP/TAZO <=4 SENSITIVE Sensitive     * >=100,000 COLONIES/mL KLEBSIELLA PNEUMONIAE  Blood Culture (routine x 2)     Status: Abnormal   Collection Time: 07/24/22  3:58 PM   Specimen: BLOOD  Result Value Ref Range Status   Specimen Description   Final    BLOOD RIGHT ANTECUBITAL Performed at [facility], [address], [place], [place] [address]    Special Requests   Final    BOTTLES DRAWN AEROBIC AND ANAEROBIC Blood Culture adequate volume Performed at [facility], [address], [place], [place] [address]    Culture  Setup Time   Final    GRAM POSITIVE COCCI IN CLUSTERS AEROBIC BOTTLE ONLY  CRITICAL RESULT CALLED TO, READ BACK BY AND VERIFIED WITH: DR.A. Pauletta Browns [id number] @ 1733 FH    Culture (A)  Final    STAPHYLOCOCCUS CAPITIS THE SIGNIFICANCE OF ISOLATING THIS ORGANISM FROM A SINGLE SET OF BLOOD CULTURES WHEN MULTIPLE SETS ARE DRAWN IS UNCERTAIN. PLEASE NOTIFY THE MICROBIOLOGY DEPARTMENT WITHIN ONE WEEK IF SPECIATION AND SENSITIVITIES ARE REQUIRED. Performed at [facility], [address], [place], [place] [address]    Report Status  07/26/2022 FINAL  Final  Blood Culture ID Panel (Reflexed)     Status: Abnormal   Collection Time: 07/24/22  3:58 PM  Result Value Ref Range Status   Enterococcus faecalis NOT DETECTED NOT DETECTED Final   Enterococcus Faecium NOT DETECTED NOT DETECTED Final   Listeria monocytogenes NOT DETECTED NOT DETECTED Final   Staphylococcus species DETECTED (A) NOT DETECTED Final    Comment: CRITICAL RESULT CALLED TO, READ BACK BY AND VERIFIED WITH: DR.A. Pauletta Browns [id number] @ 1733 FH     Staphylococcus aureus (BCID) NOT DETECTED NOT DETECTED Final   Staphylococcus epidermidis NOT DETECTED NOT DETECTED Final   Staphylococcus lugdunensis NOT DETECTED NOT DETECTED Final   Streptococcus species NOT DETECTED NOT DETECTED Final   Streptococcus agalactiae NOT DETECTED NOT DETECTED Final   Streptococcus pneumoniae NOT DETECTED NOT DETECTED Final   Streptococcus pyogenes NOT DETECTED NOT DETECTED Final   A.calcoaceticus-baumannii NOT DETECTED NOT DETECTED Final   Bacteroides fragilis NOT DETECTED NOT DETECTED Final   Enterobacterales NOT DETECTED NOT DETECTED Final   Enterobacter cloacae complex NOT DETECTED NOT DETECTED Final   Escherichia coli NOT DETECTED NOT DETECTED Final   Klebsiella aerogenes NOT DETECTED NOT DETECTED Final   Klebsiella oxytoca NOT DETECTED NOT DETECTED Final   Klebsiella pneumoniae NOT DETECTED NOT DETECTED Final   Proteus species NOT DETECTED NOT DETECTED Final   Salmonella species NOT DETECTED NOT DETECTED Final   Serratia marcescens NOT DETECTED NOT DETECTED Final   Haemophilus influenzae NOT DETECTED NOT DETECTED Final   Neisseria meningitidis NOT DETECTED NOT DETECTED Final   Pseudomonas aeruginosa NOT DETECTED NOT DETECTED Final   Stenotrophomonas maltophilia NOT DETECTED NOT DETECTED Final   Candida albicans NOT DETECTED NOT DETECTED Final   Candida auris NOT DETECTED NOT DETECTED Final   Candida glabrata NOT DETECTED NOT DETECTED Final   Candida krusei NOT DETECTED NOT  DETECTED Final   Candida parapsilosis NOT DETECTED NOT DETECTED Final   Candida tropicalis NOT DETECTED NOT DETECTED Final   Cryptococcus neoformans/gattii NOT DETECTED NOT DETECTED Final    Comment: Performed at [facility], [address], [place], [place] [address]  Blood Culture (routine x 2)     Status: None (Preliminary result)   Collection Time: 07/24/22 10:49 PM   Specimen: BLOOD LEFT ARM  Result Value Ref Range Status   Specimen Description   Final    BLOOD LEFT ARM Performed at [facility], [address], [place], [place] [address]    Special Requests   Final    BOTTLES DRAWN AEROBIC AND ANAEROBIC Blood Culture results may not be optimal due to an inadequate volume of blood received in culture bottles Performed at [facility], [address] [place], [place] [address]    Culture   Final    NO GROWTH 4 DAYS Performed at [facility], [address], [place], [place] [address]    Report Status PENDING  Incomplete  Culture, blood (Routine X 2) w Reflex to ID Panel  Status: None (Preliminary result)   Collection Time: 07/28/22  7:40 PM   Specimen: BLOOD RIGHT ARM  Result Value Ref Range Status   Specimen Description   Final    BLOOD RIGHT ARM BOTTLES DRAWN AEROBIC AND ANAEROBIC Performed at [facility], [address] [place], [place] [address]    Special Requests   Final    Blood Culture adequate volume Performed at [facility], [address], [place], [place] [address]    Culture   Final    NO GROWTH < 12 HOURS Performed at [facility], [address], [place], [place] [address]    Report Status PENDING  Incomplete  Culture, blood (Routine X 2) w Reflex to ID Panel     Status: None (Preliminary result)   Collection Time: 07/28/22  8:00 PM   Specimen: Right Antecubital; Blood  Result Value Ref Range Status   Specimen Description   Final    RIGHT ANTECUBITAL BOTTLES DRAWN  AEROBIC AND ANAEROBIC Performed at [facility], [address] [place], [place] [address]    Special Requests   Final    Blood Culture adequate volume Performed at [facility], [address], [place], [place] [address]    Culture   Final    NO GROWTH < 12 HOURS Performed at [facility], [address] [place], [place] [address]    Report Status PENDING  Incomplete  Resp panel by RT-PCR (RSV, Flu A&B, Covid) Anterior Nasal Swab     Status: None   Collection Time: 07/29/22  1:18 AM   Specimen: Anterior Nasal Swab  Result Value Ref Range Status   SARS Coronavirus 2 by RT PCR NEGATIVE NEGATIVE Final    Comment: (NOTE) SARS-CoV-2 target nucleic acids are NOT DETECTED.  The SARS-CoV-2 RNA is generally detectable in upper respiratory specimens during the acute phase of infection. The lowest concentration of SARS-CoV-2 viral copies this assay can detect is 138 copies/mL. A negative result does not preclude SARS-Cov-2 infection and should not be used as the sole basis for treatment or other patient management decisions. A negative result may occur with  improper specimen collection/handling, submission of specimen other than nasopharyngeal swab, presence of viral mutation(s) within the areas targeted by this assay, and inadequate number of viral copies(<138 copies/mL). A negative result must be combined with clinical observations, patient history, and epidemiological information. The expected result is Negative.  Fact Sheet for Patients:  BloggerCourse.com  Fact Sheet for Healthcare Providers:  SeriousBroker.it  This test is no t yet approved or cleared by the [place] FDA and  has been authorized for detection and/or diagnosis of SARS-CoV-2 by FDA under an Emergency Use Authorization (EUA). This EUA will remain  in effect (meaning this test can be used) for the duration of  the COVID-19 declaration under Section 564(b)(1) of the Act, 21 U.S.C.section 360bbb-3(b)(1), unless the authorization is terminated  or revoked sooner.       Influenza A by PCR NEGATIVE NEGATIVE Final   Influenza B by PCR NEGATIVE NEGATIVE Final    Comment: (NOTE) The Xpert Xpress SARS-CoV-2/FLU/RSV plus assay is intended as an aid in the diagnosis of influenza from Nasopharyngeal swab specimens and should not be used as a sole basis for treatment. Nasal washings and aspirates are unacceptable for Xpert Xpress SARS-CoV-2/FLU/RSV testing.  Fact Sheet for Patients: BloggerCourse.com  Fact Sheet for Healthcare Providers: SeriousBroker.it  This test is not yet approved  or cleared by the [place] and has been authorized for detection and/or diagnosis of SARS-CoV-2 by FDA under an Emergency Use Authorization (EUA). This EUA will remain in effect (meaning this test can be used) for the duration of the COVID-19 declaration under Section 564(b)(1) of the Act, 21 U.S.C. section 360bbb-3(b)(1), unless the authorization is terminated or revoked.     Resp Syncytial Virus by PCR NEGATIVE NEGATIVE Final    Comment: (NOTE) Fact Sheet for Patients: BloggerCourse.com  Fact Sheet for Healthcare Providers: SeriousBroker.it  This test is not yet approved or cleared by the [place] FDA and has been authorized for detection and/or diagnosis of SARS-CoV-2 by FDA under an Emergency Use Authorization (EUA). This EUA will remain in effect (meaning this test can be used) for the duration of the COVID-19 declaration under Section 564(b)(1) of the Act, 21 U.S.C. section 360bbb-3(b)(1), unless the authorization is terminated or revoked.  Performed at [facility], [address] [place], [place] [address]   C Difficile Quick Screen w PCR reflex     Status: None    Collection Time: 07/29/22  9:44 AM   Specimen: STOOL  Result Value Ref Range Status   C Diff antigen NEGATIVE NEGATIVE Final   C Diff toxin NEGATIVE NEGATIVE Final   C Diff interpretation No C. difficile detected.  Final    Comment: Performed at [facility], [address] [place], [place] [address]   Creatinine: Recent Labs    07/24/22 1519 07/25/22 0554 07/28/22 1845 07/29/22 0406  CREATININE 0.62 0.44 0.45 0.41*   Baseline Creatinine: 0.4  Impression/Assessment:  71yo with right ureteral calculus, UTI  Plan:  -We discussed the management of kidney stones. These options include observation, ureteroscopy, shockwave lithotripsy (ESWL) and percutaneous nephrolithotomy (PCNL). We discussed which options are relevant to the patient's stone(s). We discussed the natural history of kidney stones as well as the complications of untreated stones and the impact on quality of life without treatment as well as with each of the above listed treatments. We also discussed the efficacy of each treatment in its ability to clear the stone burden. With any of these management options I discussed the signs and symptoms of infection and the need for emergent treatment should these be experienced. For each option we discussed the ability of each procedure to clear the patient of their stone burden.   For observation I described the risks which include but are not limited to silent renal damage, life-threatening infection, need for emergent surgery, failure to pass stone and pain.   For ureteroscopy I described the risks which include bleeding, infection, damage to contiguous structures, positioning injury, ureteral stricture, ureteral avulsion, ureteral injury, need for prolonged ureteral stent, inability to perform ureteroscopy, need for an interval procedure, inability to clear stone burden, stent discomfort/pain, heart attack, stroke, pulmonary embolus and the inherent risks with  general anesthesia.   For shockwave lithotripsy I described the risks which include arrhythmia, kidney contusion, kidney hemorrhage, need for transfusion, pain, inability to adequately break up stone, inability to pass stone fragments, Steinstrasse, infection associated with obstructing stones, need for alternate surgical procedure, need for repeat shockwave lithotripsy, MI, CVA, PE and the inherent risks with anesthesia/conscious sedation.   For PCNL I described the risks including positioning injury, pneumothorax, hydrothorax, need for chest tube, inability to clear stone burden, renal laceration, arterial venous fistula or malformation, need for embolization of kidney, loss of kidney or renal function, need for repeat procedure,  need for prolonged nephrostomy tube, ureteral avulsion, MI, CVA, PE and the inherent risks of general anesthesia.   - The patient would like to proceed with right ureteral stent placement   Tanisa Toni 07/29/2022, 12:41 PM

## 2022-07-29 NOTE — Assessment & Plan Note (Signed)
Replacing with oral and intravenous potassium Monitoring potassium levels with serial chemistries

## 2022-07-29 NOTE — Anesthesia Postprocedure Evaluation (Signed)
Anesthesia Post Note  Patient: Allison Jimenez  Procedure(s) Performed: CYSTOSCOPY WITH RETROGRADE PYELOGRAM/URETERAL STENT PLACEMENT (Right: Ureter)     Patient location during evaluation: PACU Anesthesia Type: General Level of consciousness: sedated Pain management: pain level controlled Vital Signs Assessment: post-procedure vital signs reviewed and stable Respiratory status: spontaneous breathing and respiratory function stable Cardiovascular status: stable Postop Assessment: no apparent nausea or vomiting Anesthetic complications: no  No notable events documented.  Last Vitals:  Vitals:   07/29/22 1415 07/29/22 1425  BP: (!) 164/61 (!) 161/87  Pulse:  85  Resp: 15 15  Temp:  (!) 36.3 C  SpO2: 96% 95%    Last Pain:  Vitals:   07/29/22 1425  TempSrc:   PainSc: Asleep                 Allison Jimenez

## 2022-07-29 NOTE — Assessment & Plan Note (Signed)
Counseling on cessation once patient is medically improved Nicotine patches ordered

## 2022-07-29 NOTE — Progress Notes (Signed)
Patient complains of itching/burning to left eye. On assessment left eye is now red with moderate amount of discharge. Informed Dr. Pola Corn and applied warm compress to left eye. Awaiting further instructions on treatment plan.

## 2022-07-29 NOTE — Progress Notes (Signed)
Verbal approval to administer patient's AM meds with sips of water. Patient on evaluation of sipping water prior to medication administration demonstrates aspiration. Patient stated she had had trouble with swallowing over the last few months. Patient given pills individually with great difficulty. Held PO potassium at this time per Dr. Pola Corn. Informed Dr. Pola Corn of concern and ordered SLP for further diagnostic assessment.

## 2022-07-29 NOTE — Assessment & Plan Note (Addendum)
Patient presenting with persisting symptoms with worsening fatigue, shortness of breath and poor oral intake several days after being diagnosed with Klebsiella urinary tract infection and discharged 7/24 Multiple SIRS criteria including leukocytosis and tachycardia on arrival concerning for developing sepsis Resuming intravenous ceftriaxone for now Initiating intravenous fluids Admitting to progressive unit Obtaining urinalysis, chest x-ray, blood cultures, procalcitonin, lactic acid and CRP On exam patient abdomen is benign however patient is complaining of abdominal pain, will obtain repeat CT imaging of the abdomen and pelvis.

## 2022-07-29 NOTE — Anesthesia Preprocedure Evaluation (Addendum)
Anesthesia Evaluation  Patient identified by MRN, date of birth, ID band Patient awake    Reviewed: Allergy & Precautions, NPO status , Patient's Chart, lab work & pertinent test results  History of Anesthesia Complications Negative for: history of anesthetic complications  Airway Mallampati: I  TM Distance: >3 FB Neck ROM: Full    Dental  (+) Edentulous Upper, Dental Advisory Given   Pulmonary shortness of breath, COPD, Current Smoker and Patient abstained from smoking.   Pulmonary exam normal        Cardiovascular hypertension, Pt. on home beta blockers and Pt. on medications (-) angina (-) Past MI and (-) CHF Normal cardiovascular exam     Neuro/Psych  Headaches PSYCHIATRIC DISORDERS Anxiety Depression     Neuromuscular disease    GI/Hepatic negative GI ROS, Neg liver ROS,,,  Endo/Other  diabetesHypothyroidism    Renal/GU negative Renal ROS      Musculoskeletal  (+) Arthritis ,    Abdominal   Peds  Hematology  (+) Blood dyscrasia, anemia       Anesthesia Other Findings   Reproductive/Obstetrics                             Anesthesia Physical Anesthesia Plan  ASA: 3  Anesthesia Plan: General   Post-op Pain Management: Minimal or no pain anticipated, Ofirmev IV (intra-op)* and Toradol IV (intra-op)*   Induction: Intravenous  PONV Risk Score and Plan: 2 and Ondansetron, Treatment may vary due to age or medical condition and Dexamethasone  Airway Management Planned: LMA  Additional Equipment: None  Intra-op Plan:   Post-operative Plan: Extubation in OR  Informed Consent: I have reviewed the patients History and Physical, chart, labs and discussed the procedure including the risks, benefits and alternatives for the proposed anesthesia with the patient or authorized representative who has indicated his/her understanding and acceptance.   Patient has DNR.  Discussed DNR with  power of attorney and Suspend DNR.   Dental advisory given  Plan Discussed with: Anesthesiologist and CRNA  Anesthesia Plan Comments: (Discussed with son. )        Anesthesia Quick Evaluation

## 2022-07-29 NOTE — Assessment & Plan Note (Addendum)
Likely multifactorial hyponatremia.  Patient seems to have a longstanding history of chronic hyponatremia but on exam is clinically volume depleted. Hydrating with intravenous isotonic fluids Monitoring sodium levels with serial chemistries

## 2022-07-29 NOTE — Op Note (Signed)
.  Preoperative diagnosis: right UPJ stone, UTI  Postoperative diagnosis: Same  Procedure: 1 cystoscopy 2. right retrograde pyelography 3.  Intraoperative fluoroscopy, under one hour, with interpretation 4. right 6 x 24 JJ stent placement  Attending: Wilkie Aye  Anesthesia: General  Estimated blood loss: None  Drains: Right 6 x 24 JJ ureteral stent without tether, 16 French foley catheter  Specimens: none  Antibiotics: cefepime  Findings:right distal ureteral stone. Moderate hydronephrosis. No masses/lesions in the bladder. Ureteral orifices in normal anatomic location.  Indications: Patient is a 72 year old female with a history of right ureteral stone and UTI.  After discussing treatment options, they decided proceed with right stent placement.  Procedure in detail: The patient was brought to the operating room and a brief timeout was done to ensure correct patient, correct procedure, correct site.  General anesthesia was administered patient was placed in dorsal lithotomy position.  Their genitalia was then prepped and draped in usual sterile fashion.  A rigid 22 French cystoscope was passed in the urethra and the bladder.  Bladder was inspected free masses or lesions.  the ureteral orifices were in the normal orthotopic locations.  a 6 french ureteral catheter was then instilled into the right ureteral orifice.  a gentle retrograde was obtained and findings noted above.  we then placed a zip wire through the ureteral catheter and advanced up to the renal pelvis.    We then placed a 6 x 26 double-j ureteral stent over the original zip wire.  We then removed the wire and good coil was noted in the the renal pelvis under fluoroscopy and the bladder under direct vision.  A foley catheter was then placed. the bladder was then drained and this concluded the procedure which was well tolerated by patient.  Complications: None  Condition: Stable, extubated, transferred to PACU  Plan:  Patient is to be admitted for IV antibiotics. SHe will have her stone extraction in 2 weeks.

## 2022-07-29 NOTE — Progress Notes (Signed)
Discussed with patient this AM DNR status. Patient informed this nurse that she would not like to be intubated (current status DNR, but no DNI). Informed Dr. Pola Corn of patient discussion for further evaluation/update.

## 2022-07-29 NOTE — Progress Notes (Signed)
PROGRESS NOTE  Allison DREESE  DOB: Aug 21, 1950  PCP: Blane Ohara, MD ION:[id number]  DOA: 07/28/2022  LOS: 1 day  Hospital Day: 2  Brief narrative: Allison Jimenez is a 72 y.o. female with PMH significant for DM2, HTN, HLD, chronic smoking, COPD, anxiety/depression, arthritis, renal stones, infrarenal AAA. 7/27, patient was brought to the ED from home after a fall while getting out of bed in the morning.  She hit right side of her head on the dresser sustaining a bruise.   Of note, patient was recently hospitalized 7/23 until 7/24.  Patient had presented with nausea, vomiting, abdominal pain, diagnosed to have Klebsiella UTI treated with IV Rocephin, felt better and was discharged at request on the very next day to to complete remaining course of antibiotics with oral Keflex.  Since discharge, patient continued to have persistent abdominal pain, vomiting, poor oral intake, leading to generalized weakness and lethargy and ultimately a fall as described above.  In the ED, patient was afebrile, initially heart rate 78 but in few hours heart rate was 120s, blood pressure stable, breathing on room air. Labs with WBC count 15.9, hemoglobin 11.3, sodium 131, potassium 2.8 Urinalysis with clear yellow urine, negative leukocytes, nitrate or bacteria CT head, CT cervical spine without evidence of fracture or acute injury Chest x-ray unremarkable Skeletal survey of extremities negative for fracture. Respiratory virus panel unremarkable Blood culture were sent Admitted to [facility] Because of persistent abdominal pain, CT abdomen pelvis was repeated. It showed a 3 mm right UVJ stone, 5 days ago was in the upper pole collecting system, with mild obstructive uropathy. It also showed continued airspace disease in the medial segment of the right middle lobe slightly improved but still present. Urologist Dr. Ronne Binning was consulted.  Subjective: Patient was seen and examined this morning.  Pleasant elderly  Caucasian female. Propped up in bed.  Not in distress.  The first thing she asked me when can see the home. She understands that she has an obstructive urine and requires surgical intervention by urologist today.  After long conversation, she is agreeable to the procedure and strongly hoping that she will be released tomorrow. Tried to call her sister from bedside but was unable to reach her. Overnight, afebrile, heart rate in 120s Last set of blood work this morning with WBC count 14.5, sodium 139, potassium 3.1, magnesium low at 1.3, CRP elevated to 7.5  Assessment and plan: Sepsis POA Klebsiella UTI Mild right UVJ obstruction Recent diagnosed with Klebsiella UTI, given 1 day of IV Rocephin and discharged at request on oral Keflex  Presented with persistent symptoms, lethargy, fall Noted to have tachycardia, leukocytosis, altered mental status Repeat urinalysis unremarkable.  Urine culture sent.  Blood culture sent Started on broad-spectrum antibiotics. Repeat abdominal CT showed 3 mm right UVJ stone with mild obstruction.  Urology Dr. Ronne Binning has been consulted. This afternoon, patient has undergone cystoscopy with right pyelography, fluoroscopy.  Per operative note, there is a right distal ureteral stone with hydronephrosis.  Right double-J stent was placed.  Patient needs stone extraction in 2 weeks. Recent Labs  Lab 07/24/22 1519 07/24/22 1601 07/24/22 1820 07/25/22 0554 07/28/22 1845 07/28/22 1940 07/28/22 2000 07/29/22 0406  WBC 27.2*  --   --  12.2* 15.9*  --   --  14.5*  LATICACIDVEN  --  1.4 0.9  --   --  0.9  --   --   PROCALCITON  --   --   --   --   --   --  5.74  --    RML pneumonia COPD Current daily smoker CT scan from 7/23 had shown RML airspace opacities.  Seen again with slight improvement on repeat CT scan this time.   Given her pre-existing COPD and new complaint of shortness of breath, lethargy and elevated procalcitonin, I would treat her as  pneumonia Currently on IV Rocephin.  Added doxycycline this morning. Continue bronchodilators, Mucinex, incentive spirometry Counseled to quit smoking.  Nicotine patch offered   Acute metabolic encephalopathy Lethargic, confused and disoriented primarily due to infection  Continue to monitor mental status change  Generalized weakness Fall at home Skeletal survey negative for any fractures PT eval ordered   Hypokalemia/hypomagnesemia Blood potassium and magnesium level are low this morning.  Replacement ordered. Obtain phosphorus level as well Recent Labs  Lab 07/24/22 1519 07/25/22 0554 07/28/22 1845 07/28/22 2000 07/29/22 0406  K 3.8 2.9* 2.8*  --  3.1*  MG  --   --   --  1.3* 1.3*    Chronic hyponatremia Patient seems to have baseline low sodium level between 130 and 135.   Currently also volume depleted.  Monitor with IV fluids. Recent Labs  Lab 07/24/22 1519 07/25/22 0554 07/28/22 1845 07/29/22 0406  NA 130* 131* 131* 131*    Essential hypertension PTA meds-Coreg Blood pressure in range but tachycardic overnight.  Likely due to sepsis.  Continue to monitor on coreg  Aortic and coronary artery atherosclerosis  infrarenal AAA HLD 4.2cm infrarenal AAA identified on CT abdomen 07/24/2022.  Outpatient pediatric follow-up PTA meds-Lipitor  Hypothyroidism Does not seem to be on Synthroid currently TSH most recent was normal.  Repeat ordered. Recent Labs    08/28/21 1537 01/25/22 1111  TSH 1.430 2.620   RLL lung nodule  CT scan noted 9 mm right lower lobe nodule, unchanged  Continued imaging follow-up or tissue sampling recommended.  Type 2 diabetes mellitus A1c 6.4 in January 2024 PTA not on meds Blood sugar level stable.  Moderate hepatic steatosis  Uncomplicated sigmoid diverticulosis Regular bowel regimen  Osteopenia Chronic compression fractures Prior kyphoplasty at T10-L1.  Anxiety/depression Xanax as needed   Mobility: PT eval pending,  encourage ambulation  Goals of care   Code Status: DNR/DNI.  Order updated.   DVT prophylaxis:  enoxaparin (LOVENOX) injection 40 mg Start: 07/28/22 2200   Antimicrobials: IV cefepime, IV vancomycin doxycycline Fluid: None Consultants: Urology Family Communication: None at bedside  Status: Inpatient Level of care:  Progressive   Patient from: Home Anticipated d/c to: Pending clinical course Needs to continue in-hospital care:  Postop day 0, pending culture report     Diet:  Diet Order             Diet NPO time specified  Diet effective now                   Scheduled Meds:  [MAR Hold] atorvastatin  20 mg Oral Daily   [MAR Hold] calcitonin (salmon)  1 spray Alternating Nares Daily   [MAR Hold] carvedilol  25 mg Oral BID WC   [MAR Hold] doxycycline  100 mg Oral Q12H   [MAR Hold] enoxaparin (LOVENOX) injection  40 mg Subcutaneous Q24H   [MAR Hold] FLUoxetine  60 mg Oral Daily   [MAR Hold] fluticasone furoate-vilanterol  1 puff Inhalation Daily   And   [MAR Hold] umeclidinium bromide  1 puff Inhalation Daily   [MAR Hold] montelukast  10 mg Oral QHS   [MAR Hold] nicotine  21 mg  Transdermal Daily   [MAR Hold] pantoprazole  40 mg Oral Daily   [MAR Hold] pregabalin  25 mg Oral TID    PRN meds: acetaminophen, [MAR Hold] acetaminophen **OR** [MAR Hold] acetaminophen, [MAR Hold] albuterol, amisulpride, fentaNYL (SUBLIMAZE) injection, iohexol, [MAR Hold] ipratropium-albuterol, [MAR Hold] ondansetron **OR** [MAR Hold] ondansetron (ZOFRAN) IV, oxyCODONE **OR** oxyCODONE, [MAR Hold] polyethylene glycol, [MAR Hold] polyvinyl alcohol, sterile water for irrigation   Infusions:   sodium chloride Stopped (07/29/22 1245)   acetaminophen     [MAR Hold] ceFEPime (MAXIPIME) IV 2 g (07/29/22 0913)   [MAR Hold] vancomycin      Antimicrobials: Anti-infectives (From admission, onward)    Start     Dose/Rate Route Frequency Ordered Stop   07/29/22 1200  [MAR Hold]  vancomycin  (VANCOREADY) IVPB 750 mg/150 mL        (MAR Hold since Sun 07/29/2022 at 1252.Hold Reason: Transfer to a Procedural area)   750 mg 150 mL/hr over 60 Minutes Intravenous Every 24 hours 07/28/22 2140     07/29/22 1000  [MAR Hold]  ceFEPIme (MAXIPIME) 2 g in sodium chloride 0.9 % 100 mL IVPB        (MAR Hold since Sun 07/29/2022 at 1252.Hold Reason: Transfer to a Procedural area)   2 g 200 mL/hr over 30 Minutes Intravenous Every 12 hours 07/28/22 2140     07/29/22 1000  [MAR Hold]  doxycycline (VIBRA-TABS) tablet 100 mg        (MAR Hold since Sun 07/29/2022 at 1252.Hold Reason: Transfer to a Procedural area)   100 mg Oral Every 12 hours 07/29/22 0750     07/28/22 2130  vancomycin (VANCOCIN) IVPB 1000 mg/200 mL premix        1,000 mg 200 mL/hr over 60 Minutes Intravenous  Once 07/28/22 2128 07/29/22 0701   07/28/22 2130  ceFEPIme (MAXIPIME) 2 g in sodium chloride 0.9 % 100 mL IVPB        2 g 200 mL/hr over 30 Minutes Intravenous  Once 07/28/22 2128 07/29/22 0045   07/28/22 1945  cefTRIAXone (ROCEPHIN) 2 g in sodium chloride 0.9 % 100 mL IVPB  Status:  Discontinued        2 g 200 mL/hr over 30 Minutes Intravenous Every 24 hours 07/28/22 1941 07/28/22 2121       Nutritional status:  Body mass index is 20.24 kg/m.          Objective: Vitals:   07/29/22 1415 07/29/22 1425  BP: (!) 164/61 (!) 161/87  Pulse:  85  Resp: 15 15  Temp:  (!) 97.3 F (36.3 C)  SpO2: 96% 95%    Intake/Output Summary (Last 24 hours) at 07/29/2022 1433 Last data filed at 07/29/2022 1405 Gross per 24 hour  Intake 2285 ml  Output 951 ml  Net 1334 ml   Filed Weights   07/28/22 1329  Weight: 47 kg   Weight change:  Body mass index is 20.24 kg/m.   Physical Exam: General exam: Pleasant, elderly Female.  Not in Physical Distress Skin: No rashes, lesions or ulcers. HEENT: Atraumatic, normocephalic, no obvious bleeding Lungs: Clear to auscultation bilaterally CVS: Regular rate and rhythm, no  murmur GI/Abd soft, nontender, nondistended, bowel sound present CNS: Alert, cooperative x 3 Psychiatry: Frustrated because of inability to discharge today Extremities: No pedal edema, no calf tenderness  Data Review: I have personally reviewed the laboratory data and studies available.  F/u labs ordered [employer] (From admission, onward)     Start  Ordered   07/30/22 0500  Phosphorus  Tomorrow morning,   R        07/29/22 0809   07/30/22 0500  CBC with Differential/Platelet  Daily,   R      07/29/22 0809   07/30/22 0500  Basic metabolic panel  Daily,   R      07/29/22 0809            Total time spent in review of labs and imaging, patient evaluation, formulation of plan, documentation and communication with family: 45 minutes  Signed, Lorin Glass, MD [facility] 07/29/2022

## 2022-07-29 NOTE — Assessment & Plan Note (Signed)
Resume home regimen of maintenance inhalers As needed nebulized bronchodilator therapy for bouts of shortness of breath and wheezing

## 2022-07-29 NOTE — Transfer of Care (Signed)
Immediate Anesthesia Transfer of Care Note  Patient: Allison Jimenez  Procedure(s) Performed: CYSTOSCOPY WITH RETROGRADE PYELOGRAM/URETERAL STENT PLACEMENT (Right: Ureter)  Patient Location: PACU  Anesthesia Type:General  Level of Consciousness: drowsy and patient cooperative  Airway & Oxygen Therapy: Patient Spontanous Breathing and Patient connected to face mask oxygen  Post-op Assessment: Report given to RN and Post -op Vital signs reviewed and stable  Post vital signs: Reviewed and stable  Last Vitals:  Vitals Value Taken Time  BP 144/78 07/29/22 1400  Temp 36.4 C 07/29/22 1356  Pulse 83 07/29/22 1406  Resp 15 07/29/22 1408  SpO2 100 % 07/29/22 1406  Vitals shown include unfiled device data.  Last Pain:  Vitals:   07/29/22 1400  TempSrc:   PainSc: 0-No pain         Complications: No notable events documented.

## 2022-07-29 NOTE — Progress Notes (Signed)
Patient is irritable, verbally abusive, pulled IV out, attempting to pull her foley out, and exit the bed. Requesting an order for posey from Dr. Pola Corn and will place mitts on patient.

## 2022-07-29 NOTE — Assessment & Plan Note (Addendum)
Documented history of hypothyroidism however patient is currently not on Synthroid per review of home medications TSH 2.6 01/2022

## 2022-07-29 NOTE — Plan of Care (Signed)

## 2022-07-29 NOTE — ED Notes (Signed)
ED TO INPATIENT HANDOFF REPORT  Name/Age/Gender [name] 72 y.o. female  Code Status    Code Status Orders  (From admission, onward)           Start     Ordered   07/28/22 2119  Do not attempt resuscitation (DNR)  Continuous       Question Answer Comment  If patient has no pulse and is not breathing Do Not Attempt Resuscitation   If patient has a pulse and/or is breathing: Medical Treatment Goals MEDICAL INTERVENTIONS DESIRED: Use advanced airway interventions, mechanical ventilation or cardioversion in appropriate circumstances; Use medication/IV fluids as indicated; Provide comfort medications; Transfer to Progressive/Stepdown/ICU as indicated.   Consent: Discussion documented in EHR or advanced directives reviewed      07/28/22 2121           Code Status History     Date Active Date Inactive Code Status Order ID Comments User Context   07/24/2022 2332 07/25/2022 1805 DNR [id number]  Charlsie Quest, MD Inpatient       Home/SNF/Other Home  Chief Complaint Sepsis secondary to UTI (HCC) [A41.9, N39.0]  Level of Care/Admitting Diagnosis ED Disposition     ED Disposition  Admit   Condition  --   Comment  Hospital Area: [facility] [[id number]]  Level of Care: Progressive [102]  Admit to Progressive based on following criteria: MULTISYSTEM THREATS such as stable sepsis, metabolic/electrolyte imbalance with or without encephalopathy that is responding to early treatment.  May admit patient to [facility] or [facility] if equivalent level of care is available:: No  Covid Evaluation: Asymptomatic - no recent exposure (last 10 days) testing not required  Diagnosis: Sepsis secondary to UTI [facility]) [[id number]]  Admitting Physician: Marinda Elk [[id number]]  Attending Physician: Marinda Elk [[id number]]  Certification:: I certify this patient will need inpatient services for at least 2 midnights  Estimated Length of Stay: 3           Medical History Past Medical History:  Diagnosis Date   Acute blood loss anemia (ABLA) 09/05/2020   Allergy    ANXIETY 06/05/2006   Arthritis    SHOULDERS    BACK PAIN 01/05/2010   Cataract    bilateral, left worse   Centrilobular emphysema (HCC)    COPD (chronic obstructive pulmonary disease) (HCC)    Depression    Diabetes mellitus without complication (HCC)    no meds, diet controllled   DIVERTICULOSIS, COLON 06/05/2006   DIZZINESS OR VERTIGO 06/05/2006   positional   Hearing loss in left ear    no hearing aid   History of blood transfusion 08/2020   History of kidney stones    passed stones   HYPERLIPIDEMIA 06/05/2006   Hypertension    HYPOKALEMIA 12/14/2008   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   Qualifier: Diagnosis of  By: Amador Cunas  MD, Janett Labella    Migraine    TOBACCO USER 11/25/2007    Allergies Allergies  Allergen Reactions   Erythromycin Base Diarrhea   Vfend [Voriconazole] Other (See Comments)    Visual changes   Penicillins Rash    IV Location/Drains/Wounds Patient Lines/Drains/Airways Status     Active Line/Drains/Airways     Name Placement date Placement time Site Days   Peripheral IV 07/28/22 20 G Posterior;Right Hand 07/28/22  1842  Hand  1            Labs/Imaging Results for orders placed or performed during the hospital encounter of 07/28/22 (  from the past 48 hour(s))  Basic metabolic panel     Status: Abnormal   Collection Time: 07/28/22  6:45 PM  Result Value Ref Range   Sodium 131 (L) 135 - 145 mmol/L   Potassium 2.8 (L) 3.5 - 5.1 mmol/L   Chloride 98 98 - 111 mmol/L   CO2 22 22 - 32 mmol/L   Glucose, Bld 88 70 - 99 mg/dL    Comment: Glucose reference range applies only to samples taken after fasting for at least 8 hours.   BUN <5 (L) 8 - 23 mg/dL   Creatinine, Ser 1.61 0.44 - 1.00 mg/dL   Calcium 7.4 (L) 8.9 - 10.3 mg/dL   GFR, Estimated >09 >60 mL/min    Comment: (NOTE) Calculated using the CKD-EPI Creatinine Equation  (2021)    Anion gap 11 5 - 15    Comment: Performed at [facility], [address], [place], [place] [address]  CBC with Differential     Status: Abnormal   Collection Time: 07/28/22  6:45 PM  Result Value Ref Range   WBC 15.9 (H) 4.0 - 10.5 K/uL   RBC 3.91 3.87 - 5.11 MIL/uL   Hemoglobin 11.3 (L) 12.0 - 15.0 g/dL   HCT 81.1 (L) 91.4 - 78.2 %   MCV 88.7 80.0 - 100.0 fL   MCH 28.9 26.0 - 34.0 pg   MCHC 32.6 30.0 - 36.0 g/dL   RDW 95.6 21.3 - 08.6 %   Platelets 508 (H) 150 - 400 K/uL   nRBC 0.0 0.0 - 0.2 %   Neutrophils Relative % 73 %   Neutro Abs 11.5 (H) 1.7 - 7.7 K/uL   Lymphocytes Relative 18 %   Lymphs Abs 2.9 0.7 - 4.0 K/uL   Monocytes Relative 8 %   Monocytes Absolute 1.2 (H) 0.1 - 1.0 K/uL   Eosinophils Relative 0 %   Eosinophils Absolute 0.0 0.0 - 0.5 K/uL   Basophils Relative 0 %   Basophils Absolute 0.1 0.0 - 0.1 K/uL   Immature Granulocytes 1 %   Abs Immature Granulocytes 0.21 (H) 0.00 - 0.07 K/uL    Comment: Performed at [facility], [address], [place], [place] [address]  Urinalysis, Routine w reflex microscopic -Urine, Clean Catch     Status: Abnormal   Collection Time: 07/28/22  6:50 PM  Result Value Ref Range   Color, Urine YELLOW YELLOW   APPearance CLEAR CLEAR   Specific Gravity, Urine 1.011 1.005 - 1.030   pH 6.0 5.0 - 8.0   Glucose, UA NEGATIVE NEGATIVE mg/dL   Hgb urine dipstick NEGATIVE NEGATIVE   Bilirubin Urine NEGATIVE NEGATIVE   Ketones, ur 5 (A) NEGATIVE mg/dL   Protein, ur 962 (A) NEGATIVE mg/dL   Nitrite NEGATIVE NEGATIVE   Leukocytes,Ua NEGATIVE NEGATIVE   RBC / HPF 0-5 0 - 5 RBC/hpf   WBC, UA 0-5 0 - 5 WBC/hpf   Bacteria, UA NONE SEEN NONE SEEN   Squamous Epithelial / HPF 0-5 0 - 5 /HPF   Mucus PRESENT    Hyaline Casts, UA PRESENT     Comment: Performed at [facility], [address] [place], [place] [address]  Lactic acid, plasma     Status: None   Collection Time:  07/28/22  7:40 PM  Result Value Ref Range   Lactic Acid, Venous 0.9 0.5 - 1.9 mmol/L    Comment: Performed at [facility], [address] [place], [place]  [address]  Magnesium     Status: Abnormal   Collection Time: 07/28/22  8:00 PM  Result Value Ref Range   Magnesium 1.3 (L) 1.7 - 2.4 mg/dL    Comment: Performed at [facility], [address], [place], [place] [address]  Procalcitonin     Status: None   Collection Time: 07/28/22  8:00 PM  Result Value Ref Range   Procalcitonin 5.74 ng/mL    Comment:        Interpretation: PCT > 2 ng/mL: Systemic infection (sepsis) is likely, unless other causes are known. (NOTE)       Sepsis PCT Algorithm           Lower Respiratory Tract                                      Infection PCT Algorithm    ----------------------------     ----------------------------         PCT < 0.25 ng/mL                PCT < 0.10 ng/mL          Strongly encourage             Strongly discourage   discontinuation of antibiotics    initiation of antibiotics    ----------------------------     -----------------------------       PCT 0.25 - 0.50 ng/mL            PCT 0.10 - 0.25 ng/mL               OR       >80% decrease in PCT            Discourage initiation of                                            antibiotics      Encourage discontinuation           of antibiotics    ----------------------------     -----------------------------         PCT >= 0.50 ng/mL              PCT 0.26 - 0.50 ng/mL               AND       <80% decrease in PCT              Encourage initiation of                                             antibiotics       Encourage continuation           of antibiotics    ----------------------------     -----------------------------        PCT >= 0.50 ng/mL                  PCT > 0.50 ng/mL               AND         increase in PCT  Strongly encourage                                       initiation of antibiotics    Strongly encourage escalation           of antibiotics                                     -----------------------------                                           PCT <= 0.25 ng/mL                                                 OR                                        > 80% decrease in PCT                                      Discontinue / Do not initiate                                             antibiotics  Performed at [facility], [address], [place], [place] [address]    DG Chest Port 1 View  Result Date: 07/28/2022 CLINICAL DATA:  Sepsis. EXAM: PORTABLE CHEST 1 VIEW COMPARISON:  July 24, 2022. FINDINGS: The heart size and mediastinal contours are within normal limits. Both lungs are clear. The visualized skeletal structures are unremarkable. IMPRESSION: No active disease. Electronically Signed   By: Lupita Raider M.D.   On: 07/28/2022 20:35   CT Cervical Spine Wo Contrast  Result Date: 07/28/2022 CLINICAL DATA:  Trauma, fall EXAM: CT CERVICAL SPINE WITHOUT CONTRAST TECHNIQUE: Multidetector CT imaging of the cervical spine was performed without intravenous contrast. Multiplanar CT image reconstructions were also generated. RADIATION DOSE REDUCTION: This exam was performed according to the departmental dose-optimization program which includes automated exposure control, adjustment of the mA and/or kV according to patient size and/or use of iterative reconstruction technique. COMPARISON:  None Available. FINDINGS: Alignment: There is minimal anterolisthesis at C3-C4 level, possibly due to previous ligament injury. Skull base and vertebrae: No recent fracture is seen. Degenerative changes are noted at multiple levels. Soft tissues and spinal canal: There is extrinsic pressure over the ventral margin of thecal sac caused by posterior bony spurs and bulging of the annulus at multiple levels, more prominent at C4-C5 and C5-C6 levels.  Calcifications are seen adjacent to the lamina in C4 and C5 vertebrae causing mild extrinsic pressure over the dorsal margin of the thecal sac. Disc levels: There is encroachment of neural foramina from C4-C7 levels. Upper chest: Linear densities are seen in both apices suggesting possible scarring. Blebs  and bullae are seen in the apices. Other: Scattered arterial calcifications are seen. IMPRESSION: No recent fracture is seen in cervical spine. Cervical spondylosis with encroachment of neural foramina from C4-C7 levels. Electronically Signed   By: Ernie Avena M.D.   On: 07/28/2022 17:04   CT Head Wo Contrast  Result Date: 07/28/2022 CLINICAL DATA:  Trauma, fall EXAM: CT HEAD WITHOUT CONTRAST TECHNIQUE: Contiguous axial images were obtained from the base of the skull through the vertex without intravenous contrast. RADIATION DOSE REDUCTION: This exam was performed according to the departmental dose-optimization program which includes automated exposure control, adjustment of the mA and/or kV according to patient size and/or use of iterative reconstruction technique. COMPARISON:  07/11/2020 FINDINGS: Brain: No acute intracranial findings are seen. There are no signs of bleeding within the cranium. Cortical sulci are prominent. Vascular: Unremarkable. Skull: No fracture is seen in calvarium. Sinuses/Orbits: There are no air-fluid levels in paranasal sinuses. Orbits are unremarkable. Other: None. IMPRESSION: No acute intracranial findings are seen in noncontrast CT brain. Atrophy. Electronically Signed   By: Ernie Avena M.D.   On: 07/28/2022 16:59   DG Pelvis 1-2 Views  Result Date: 07/28/2022 CLINICAL DATA:  Pain after fall EXAM: PELVIS - 1 VIEW COMPARISON:  None Available. FINDINGS: Osteopenia. Hyperostosis. Mild sclerosis along the sacroiliac joints. Scattered vascular calcifications. With this level of osteopenia subtle nondisplaced injury is difficult to completely exclude and if needed  additional cross-sectional imaging as clinically directed. IMPRESSION: Osteopenia. Degenerative changes of the sacroiliac joints. Hyperostosis. Electronically Signed   By: Karen Kays M.D.   On: 07/28/2022 16:42   DG Tibia/Fibula Left  Result Date: 07/28/2022 CLINICAL DATA:  Pain after fall EXAM: LEFT TIBIA AND FIBULA - 2 VIEW COMPARISON:  None Available. FINDINGS: No fracture or dislocation. Preserved adjacent joint spaces. There is chondrocalcinosis about the medial and lateral compartments of the knee. Osteopenia. IMPRESSION: No acute osseous abnormality.  Osteopenia.  Chondrocalcinosis. Electronically Signed   By: Karen Kays M.D.   On: 07/28/2022 16:41   DG Forearm Right  Result Date: 07/28/2022 CLINICAL DATA:  Pain after fall EXAM: RIGHT FOREARM - 2 VIEW; RIGHT HAND - COMPLETE 3 VIEW COMPARISON:  None Available. FINDINGS: Osteopenia. No fracture or dislocation. Chondrocalcinosis about the triangular fibrocartilage of the wrist. Scattered degenerative changes of the hand including joint space loss and small osteophytes of the distal interphalangeal joints diffusely IMPRESSION: Osteopenia.  Chondrocalcinosis about the wrist. Degenerative changes of the hand particularly the distal interphalangeal joints. Electronically Signed   By: Karen Kays M.D.   On: 07/28/2022 16:40   DG Hand Complete Right  Result Date: 07/28/2022 CLINICAL DATA:  Pain after fall EXAM: RIGHT FOREARM - 2 VIEW; RIGHT HAND - COMPLETE 3 VIEW COMPARISON:  None Available. FINDINGS: Osteopenia. No fracture or dislocation. Chondrocalcinosis about the triangular fibrocartilage of the wrist. Scattered degenerative changes of the hand including joint space loss and small osteophytes of the distal interphalangeal joints diffusely IMPRESSION: Osteopenia.  Chondrocalcinosis about the wrist. Degenerative changes of the hand particularly the distal interphalangeal joints. Electronically Signed   By: Karen Kays M.D.   On: 07/28/2022 16:40    DG Forearm Left  Result Date: 07/28/2022 CLINICAL DATA:  Pain after fall EXAM: LEFT FOREARM - 2 VIEW COMPARISON:  None Available. FINDINGS: Osteopenia. No fracture or dislocation. Preserved adjacent joint spaces. IMPRESSION: No acute osseous abnormality.  Osteopenia Electronically Signed   By: Karen Kays M.D.   On: 07/28/2022 16:38    Pending Labs Unresulted  Labs (From admission, onward)     Start     Ordered   07/29/22 0500  APTT  Tomorrow morning,   R        07/28/22 2121   07/29/22 0500  Protime-INR  Tomorrow morning,   R        07/28/22 2121   07/29/22 0500  CBC with Differential/Platelet  Tomorrow morning,   R        07/29/22 0108   07/29/22 0500  Comprehensive metabolic panel  Tomorrow morning,   R        07/29/22 0108   07/29/22 0500  Magnesium  Tomorrow morning,   R        07/29/22 0108   07/28/22 1942  Resp panel by RT-PCR (RSV, Flu A&B, Covid) Anterior Nasal Swab  (Undifferentiated -> Now sepsis confirmed (treatment and sepsis specific nursing orders))  Once,   URGENT        07/28/22 1941   07/28/22 1933  C-reactive protein  Once,   URGENT        07/28/22 1933   07/28/22 1933  Culture, blood (Routine X 2) w Reflex to ID Panel  BLOOD CULTURE X 2,   R (with STAT occurrences)      07/28/22 1933            Vitals/Pain Today's Vitals   07/28/22 2345 07/29/22 0000 07/29/22 0015 07/29/22 0100  BP: (!) 148/96 116/73 (!) 147/89 (!) 151/100  Pulse: (!) 129 (!) 135 (!) 132 (!) 129  Resp: 15 16 (!) 21 16  Temp:      TempSrc:      SpO2: 97% 94% 96% 96%  Weight:      Height:      PainSc:        Isolation Precautions No active isolations  Medications Medications  albuterol (PROVENTIL) (2.5 MG/3ML) 0.083% nebulizer solution 3 mL (has no administration in time range)  ipratropium-albuterol (DUONEB) 0.5-2.5 (3) MG/3ML nebulizer solution 3 mL (has no administration in time range)  atorvastatin (LIPITOR) tablet 20 mg (has no administration in time range)  calcitonin  (salmon) (MIACALCIN/FORTICAL) nasal spray 1 spray (has no administration in time range)  carvedilol (COREG) tablet 25 mg (has no administration in time range)  FLUoxetine (PROZAC) capsule 60 mg (60 mg Oral Given 07/28/22 2251)  fluticasone furoate-vilanterol (BREO ELLIPTA) 100-25 MCG/ACT 1 puff (has no administration in time range)    And  umeclidinium bromide (INCRUSE ELLIPTA) 62.5 MCG/ACT 1 puff (has no administration in time range)  montelukast (SINGULAIR) tablet 10 mg (10 mg Oral Given 07/28/22 2251)  pantoprazole (PROTONIX) EC tablet 40 mg (has no administration in time range)  pregabalin (LYRICA) capsule 25 mg (25 mg Oral Given 07/28/22 2250)  enoxaparin (LOVENOX) injection 40 mg (40 mg Subcutaneous Given 07/29/22 0100)  acetaminophen (TYLENOL) tablet 650 mg (has no administration in time range)    Or  acetaminophen (TYLENOL) suppository 650 mg (has no administration in time range)  polyethylene glycol (MIRALAX / GLYCOLAX) packet 17 g (has no administration in time range)  ondansetron (ZOFRAN) tablet 4 mg (has no administration in time range)    Or  ondansetron (ZOFRAN) injection 4 mg (has no administration in time range)  lactated ringers 1,000 mL with potassium chloride 40 mEq infusion ( Intravenous New Bag/Given 07/29/22 0100)  vancomycin (VANCOCIN) IVPB 1000 mg/200 mL premix (1,000 mg Intravenous New Bag/Given 07/29/22 0100)  ceFEPIme (MAXIPIME) 2 g in sodium chloride 0.9 % 100 mL IVPB (has no  administration in time range)  vancomycin (VANCOREADY) IVPB 750 mg/150 mL (has no administration in time range)  lactated ringers bolus 1,000 mL (0 mLs Intravenous Stopped 07/28/22 2255)  potassium chloride SA (KLOR-CON M) CR tablet 40 mEq (40 mEq Oral Given 07/28/22 2251)  ceFEPIme (MAXIPIME) 2 g in sodium chloride 0.9 % 100 mL IVPB (0 g Intravenous Stopped 07/29/22 0045)  iohexol (OMNIPAQUE) 300 MG/ML solution 80 mL (80 mLs Intravenous Contrast Given 07/29/22 0130)    Mobility walks with device

## 2022-07-30 ENCOUNTER — Encounter (HOSPITAL_COMMUNITY): Payer: Self-pay | Admitting: Urology

## 2022-07-30 DIAGNOSIS — G9341 Metabolic encephalopathy: Secondary | ICD-10-CM | POA: Diagnosis not present

## 2022-07-30 DIAGNOSIS — A419 Sepsis, unspecified organism: Secondary | ICD-10-CM | POA: Diagnosis not present

## 2022-07-30 LAB — T4, FREE: Free T4: 0.96 ng/dL (ref 0.61–1.12)

## 2022-07-30 MED ORDER — QUETIAPINE FUMARATE 25 MG PO TABS
25.0000 mg | ORAL_TABLET | Freq: Two times a day (BID) | ORAL | Status: DC
  Administered 2022-07-30 – 2022-07-31 (×2): 25 mg via ORAL
  Filled 2022-07-30 (×3): qty 1

## 2022-07-30 MED ORDER — CEFAZOLIN SODIUM-DEXTROSE 2-4 GM/100ML-% IV SOLN
2.0000 g | Freq: Three times a day (TID) | INTRAVENOUS | Status: DC
  Administered 2022-07-30 – 2022-07-31 (×2): 2 g via INTRAVENOUS
  Filled 2022-07-30 (×3): qty 100

## 2022-07-30 MED ORDER — CHLORHEXIDINE GLUCONATE CLOTH 2 % EX PADS
6.0000 | MEDICATED_PAD | Freq: Every day | CUTANEOUS | Status: DC
  Administered 2022-07-30: 6 via TOPICAL

## 2022-07-30 NOTE — TOC Initial Note (Signed)
Transition of Care [facility]) - Initial/Assessment Note    Patient Details  Name: Allison Jimenez MRN: [id number] Date of Birth: March 10, 1950  [facility]) CM/SW Contact:    Howell Rucks, RN Phone Number: 07/30/2022, 2:45 PM  Clinical Narrative:  NCM called to pt's son Rodolph Bong) to introduce role of TOC/NCM and review for dc planning, PT recommendation for short term rehab-SNF, Rodolph Bong requested I call pt's sister on file [name]),  call to pt's sister, no answer, vm left with NCM name and phone number requesting call back. TOC will continue to follow.                        Patient Goals and CMS Choice            Expected Discharge Plan and Services                                              Prior Living Arrangements/Services                       Activities of Daily Living Home Assistive Devices/Equipment: Dan Humphreys (specify type) ADL Screening (condition at time of admission) Patient's cognitive ability adequate to safely complete daily activities?: Yes Is the patient deaf or have difficulty hearing?: Yes Does the patient have difficulty seeing, even when wearing glasses/contacts?: No Does the patient have difficulty concentrating, remembering, or making decisions?: No Patient able to express need for assistance with ADLs?: Yes Does the patient have difficulty dressing or bathing?: No Independently performs ADLs?: Yes (appropriate for developmental age) Does the patient have difficulty walking or climbing stairs?: Yes Weakness of Legs: Both Weakness of Arms/Hands: None  Permission Sought/Granted                  Emotional Assessment              Admission diagnosis:  Hypokalemia [E87.6] Sepsis secondary to UTI (HCC) [A41.9, N39.0] Fall, initial encounter [W19.XXXA] Altered mental status, unspecified altered mental status type [R41.82] Sepsis, due to unspecified organism, unspecified whether acute organ dysfunction  present [facility]) [A41.9] Patient Active Problem List   Diagnosis Date Noted   Sepsis (HCC) 07/28/2022   UTI due to Klebsiella species 07/28/2022   Acute metabolic encephalopathy 07/28/2022   Nicotine dependence, cigarettes, uncomplicated 07/28/2022   Fall at home, initial encounter 07/28/2022   Osteopenia of lumbar spine 04/09/2022   GAD (generalized anxiety disorder) 04/09/2022   Bruising 04/01/2022   Dysuria 04/01/2022   Vitamin D insufficiency 01/25/2022   Visit for screening mammogram 01/25/2022   Hoarseness 10/28/2021   Pathological fracture due to age-related osteoporosis with routine healing 10/23/2021   Pulmonary nodules    Abnormal TSH 09/01/2021   Other constipation 09/01/2021   Hyponatremia 07/17/2021   B12 deficiency 07/17/2021   Flushes 05/28/2021   COPD (chronic obstructive pulmonary disease) (HCC) 01/19/2021   Aneurysm of infrarenal abdominal aorta (HCC) 09/05/2020   Hypothyroidism (acquired) 07/10/2020   Hypertension complicating diabetes (HCC) 07/10/2020   Ocular migraine with status migrainosus 07/10/2020   Essential hypertension 06/15/2019   Acute infection of nasal sinus 05/12/2019   Major depressive disorder, recurrent episode, mild (HCC) 09/10/2018   Mixed conductive and sensorineural hearing loss of left ear with restricted hearing of right ear 01/24/2017   Meniere's disease in remission,  bilateral 01/24/2017   Lumbar back pain 01/05/2010   Hypokalemia 12/14/2008   TOBACCO USER 11/25/2007   Dyslipidemia 06/05/2006   Diverticulosis of colon 06/05/2006   Dizziness 06/05/2006   PCP:  Blane Ohara, MD Pharmacy:   [facility] [phone] Rosalita Levan, [place] - [address] AT [facility] [address] [place] [address] Phone: [phone] Fax: [phone]     Social Determinants of Health (SDOH) Social History: SDOH Screenings   Food Insecurity: No Food Insecurity (07/29/2022)  Housing: Low Risk   (07/29/2022)  Transportation Needs: No Transportation Needs (07/29/2022)  Utilities: Not At Risk (07/29/2022)  Alcohol Screen: Low Risk  (09/16/2020)  Depression (PHQ2-9): Low Risk  (01/31/2022)  Social Connections: Unknown (05/12/2021)   Received from [facility]  Tobacco Use: High Risk (07/28/2022)   SDOH Interventions:     Readmission Risk Interventions     No data to display

## 2022-07-30 NOTE — Evaluation (Signed)
Physical Therapy Evaluation Patient Details Name: Allison Jimenez MRN: [id number] DOB: 26-Feb-1950 Today's Date: 07/30/2022  History of Present Illness  Patient is a 72 y.o. female with PMH: COPD, HTN, HLD, depression, anxiety, infrarenal abdominal aortic aneurysm. She presented to [facility] ED on 07/28/2022  with her sister due to increasing lethargy and a fall in AM. Patient was recently admitted 7/23-24, found to have Klebsiella UTI but she adamantly wanted to leave after day two of admission and was sent home with remaining antibiotics.  In ED, she was found to have multiple SIRS criteria including tachycardia, leukocytosis and clinical evidence of dehydration and encephalopathy. She has been confused and agitated at times, requiring wrist restraints.S/P CYSTOSCOPY WITH RETROGRADE PYELOGRAM/URETERAL STENT PLACEMENT (Right: Ureter) 07/28/22  Clinical Impression  Pt admitted with above diagnosis.  Pt currently with functional limitations due to the deficits listed below (see PT Problem List). Pt will benefit from acute skilled PT to increase their independence and safety with mobility to allow discharge.   PT evaluation limited by patient participation. Patient agitated and not alloowing therapist to assist in mobility. Continue PT attempts as patient   participates.         If plan is discharge home, recommend the following: A lot of help with walking and/or transfers;A lot of help with bathing/dressing/bathroom;Assistance with cooking/housework;Assist for transportation;Help with stairs or ramp for entrance   Can travel by private vehicle        Equipment Recommendations None recommended by PT  Recommendations for Other Services       Functional Status Assessment Patient has had a recent decline in their functional status and demonstrates the ability to make significant improvements in function in a reasonable and predictable amount of time.     Precautions / Restrictions  Precautions Precautions: Fall Precaution Comments: agitated, in restraints Restrictions Weight Bearing Restrictions: No      Mobility  Bed Mobility Overal bed mobility: Needs Assistance             General bed mobility comments: assisted to roll to get belt out from under back, patient resisting    Transfers                   General transfer comment: unable  to test due to patient MS    Ambulation/Gait               General Gait Details: NT  Stairs            Wheelchair Mobility     Tilt Bed    Modified Rankin (Stroke Patients Only)       Balance                                             Pertinent Vitals/Pain Pain Assessment Pain Assessment: No/denies pain    Home Living Family/patient expects to be discharged to:: Private residence Living Arrangements: Other relatives Available Help at Discharge: Family Type of Home: House Home Access: Stairs to enter Entrance Stairs-Rails: Left Entrance Stairs-Number of Steps: 2   Home Layout: One level Home Equipment: Rollator (4 wheels) Additional Comments: info from previous encounter as pt. unable    Prior Function               Mobility Comments: unsure, assume ambulatory at last DC ADLs Comments: unsure     Hand Dominance  Extremity/Trunk Assessment   Upper Extremity Assessment Upper Extremity Assessment: Difficult to assess due to impaired cognition    Lower Extremity Assessment Lower Extremity Assessment:  (moving legs around in bed)       Communication   Communication: No difficulties  Cognition Arousal/Alertness: Awake/alert Behavior During Therapy: Restless, Agitated Overall Cognitive Status: Impaired/Different from baseline Area of Impairment: Orientation, Attention, Following commands, Awareness                 Orientation Level: Time, Situation Current Attention Level: Focused   Following Commands: Follows one step  commands inconsistently   Awareness: Intellectual   General Comments: keeps eyes closed mostly, Patient did not follow any directions, became agitated and resistive when therapist  untyiong anf tying restraints and roll belt, states" stop pulling on my arms."-        General Comments General comments (skin integrity, edema, etc.): NT    Exercises     Assessment/Plan    PT Assessment Patient needs continued PT services  PT Problem List Decreased activity tolerance;Decreased cognition       PT Treatment Interventions DME instruction;Therapeutic activities;Cognitive remediation;Gait training;Functional mobility training;Therapeutic exercise;Patient/family education    PT Goals (Current goals can be found in the Care Plan section)  Acute Rehab PT Goals PT Goal Formulation: Patient unable to participate in goal setting Time For Goal Achievement: 08/13/22 Potential to Achieve Goals: Fair    Frequency Min 1X/week     Co-evaluation               AM-PAC PT "6 Clicks" Mobility  Outcome Measure Help needed turning from your back to your side while in a flat bed without using bedrails?: Total Help needed moving from lying on your back to sitting on the side of a flat bed without using bedrails?: Total Help needed moving to and from a bed to a chair (including a wheelchair)?: Total Help needed standing up from a chair using your arms (e.g., wheelchair or bedside chair)?: Total Help needed to walk in hospital room?: Total Help needed climbing 3-5 steps with a railing? : Total 6 Click Score: 6    End of Session   Activity Tolerance: Treatment limited secondary to agitation Patient left: in bed;with bed alarm set Nurse Communication: Mobility status (pt. not  participatory) PT Visit Diagnosis: Other abnormalities of gait and mobility (R26.89)    Time: 6578-4696 PT Time Calculation (min) (ACUTE ONLY): 17 min   Charges:   PT Evaluation $PT Eval Low Complexity: 1 Low    PT General Charges $$ ACUTE PT VISIT: 1 Visit         Blanchard Kelch PT Acute Rehabilitation Services Office [phone] Weekend pager-[phone]   Rada Hay 07/30/2022, 1:14 PM

## 2022-07-30 NOTE — Progress Notes (Signed)
Patient continues to be very confused, alert only to self. Continues to think this RN is "Harriett Sine"- patient's co-worker, even after reorientation; having full conversations with people who are not present. Pt also yells at times, repeatedly apologizing for "leaving a cigarette burning in the corner." Remains in bilateral wrist restraints and waist belt restraints. MD Dahal aware of patient's level of confusion. No new orders. Pt assisted with meal and only ate a small percentage of it. Pills taken whole with AS- will continue to monitor closely.

## 2022-07-30 NOTE — Plan of Care (Signed)
  Problem: Safety: Goal: Ability to remain free from injury will improve Outcome: Progressing   

## 2022-07-30 NOTE — Progress Notes (Signed)
PROGRESS NOTE  Allison Jimenez  DOB: [birth year]  PCP: Blane Ohara, MD JYN:[id number]  DOA: 07/28/2022  LOS: 2 days  Hospital Day: 3  Brief narrative: Allison Jimenez is a 72 y.o. female with PMH significant for DM2, HTN, HLD, chronic smoking, COPD, anxiety/depression, arthritis, renal stones, infrarenal AAA. 7/27, patient was brought to the ED from home after a fall while getting out of bed in the morning.  She hit right side of her head on the dresser sustaining a bruise.   Of note, patient was recently hospitalized 7/23 until 7/24.  Patient had presented with nausea, vomiting, abdominal pain, diagnosed to have Klebsiella UTI treated with IV Rocephin, felt better and was discharged at request on the very next day to to complete remaining course of antibiotics with oral Keflex.  Since discharge, patient continued to have persistent abdominal pain, vomiting, poor oral intake, leading to generalized weakness and lethargy and ultimately a fall as described above.  In the ED, patient was afebrile, initially heart rate 78 but in few hours heart rate was 120s, blood pressure stable, breathing on room air. Labs with WBC count 15.9, hemoglobin 11.3, sodium 131, potassium 2.8 Urinalysis with clear yellow urine, negative leukocytes, nitrate or bacteria CT head, CT cervical spine without evidence of fracture or acute injury Chest x-ray unremarkable Skeletal survey of extremities negative for fracture. Respiratory virus panel unremarkable Blood culture were sent Admitted to [facility] Because of persistent abdominal pain, CT abdomen pelvis was repeated. It showed a 3 mm right UVJ stone, 5 days ago was in the upper pole collecting system, with mild obstructive uropathy. It also showed continued airspace disease in the medial segment of the right middle lobe slightly improved but still present. Urologist Dr. Ronne Binning was consulted.  Subjective: Patient was seen and examined this morning. Calm most of the  times but confused and gets agitated at times.   She is on wrist restraints.  Assessment and plan: Sepsis POA Klebsiella UTI Mild right UVJ obstruction Recently diagnosed with Klebsiella UTI, given 1 day of IV Rocephin and discharged at request on oral Keflex  Presented with persistent symptoms, lethargy, fall Noted to have tachycardia, leukocytosis, altered mental status Repeat urinalysis unremarkable.   Urine culture sent.  Blood culture sent.  No growth so far. She was started on broad-spectrum antibiotics. Repeat abdominal CT showed 3 mm right UVJ stone with mild obstruction.  Urology Dr. Ronne Binning has been consulted. 7/21 she underwent cystoscopy with right pyelography, fluoroscopy.  Per operative note, there is a right distal ureteral stone with hydronephrosis.  Right double-J stent was placed.  Patient needs stone extraction in 2 weeks. Foley catheter was placed. Urology follow-up appreciated.  Noted a plan to remove Foley catheter today. Recent Labs  Lab 07/24/22 1519 07/24/22 1601 07/24/22 1820 07/25/22 0554 07/28/22 1845 07/28/22 1940 07/28/22 2000 07/29/22 0406 07/30/22 0426  WBC 27.2*  --   --  12.2* 15.9*  --   --  14.5* 10.2  LATICACIDVEN  --  1.4 0.9  --   --  0.9  --   --   --   PROCALCITON  --   --   --   --   --   --  5.74  --   --    RML pneumonia COPD Current daily smoker CT scan from 7/23 had shown RML airspace opacities.  Seen again with slight improvement on repeat CT scan this time.   Given her pre-existing COPD and new complaint of shortness  of breath, lethargy and elevated procalcitonin, she is getting antibiotics for pneumonia as well.   Continue bronchodilators, Mucinex, incentive spirometry Counseled to quit smoking.  Nicotine patch offered   Acute metabolic encephalopathy Confused and at times agitated Multifactorial: Cerebral atrophy in CT scan, acute illness, anesthesia, hospitalization Start Seroquel 25 mg twice daily.   Continue to monitor  mental status change  Generalized weakness Fall at home Skeletal survey negative for any fractures PT eval ordered   Hypokalemia/hypomagnesemia Potassium and magnesium relation given. Recent Labs  Lab 07/24/22 1519 07/25/22 0554 07/28/22 1845 07/28/22 2000 07/29/22 0406 07/30/22 0426  K 3.8 2.9* 2.8*  --  3.1* 3.9  MG  --   --   --  1.3* 1.3*  --   PHOS  --   --   --   --   --  3.1    Chronic hyponatremia Patient seems to have baseline low sodium level between 130 and 135.   Currently also volume depleted.  Monitor with IV fluids. Recent Labs  Lab 07/24/22 1519 07/25/22 0554 07/28/22 1845 07/29/22 0406 07/30/22 0426  NA 130* 131* 131* 131* 130*    Essential hypertension Heart rate and blood pressure controlled on Coreg  Aortic and coronary artery atherosclerosis  infrarenal AAA HLD 4.2cm infrarenal AAA identified on CT abdomen 07/24/2022.  Outpatient pediatric follow-up PTA meds-Lipitor  Hypothyroidism Does not seem to be on Synthroid currently TSH most recent in January was normal.  Repeat ordered.  TSH this hospitalization is elevated to 8. Recent Labs    08/28/21 1537 01/25/22 1111 07/29/22 1501  TSH 1.430 2.620 7.922*   RLL lung nodule  CT scan noted 9 mm right lower lobe nodule, unchanged  Continued imaging follow-up or tissue sampling recommended.  Type 2 diabetes mellitus A1c 6.4 in January 2024 PTA not on meds Blood sugar level stable.  Moderate hepatic steatosis  Uncomplicated sigmoid diverticulosis Regular bowel regimen  Osteopenia Chronic compression fractures Prior kyphoplasty at T10-L1.  Anxiety/depression Xanax as needed   Mobility: PT eval pending, encourage ambulation  Goals of care   Code Status: DNR/DNI.  Order updated.   DVT prophylaxis:  enoxaparin (LOVENOX) injection 40 mg Start: 07/28/22 2200   Antimicrobials: IV Ancef, doxycycline Fluid: None Consultants: Urology Family Communication: None at  bedside  Status: Inpatient Level of care:  Progressive   Patient from: Home Anticipated d/c to: Pending clinical course Needs to continue in-hospital care:  Postop day 0, pending culture report     Diet:  Diet Order             DIET DYS 3 Room service appropriate? Yes; Fluid consistency: Thin  Diet effective now                   Scheduled Meds:  atorvastatin  20 mg Oral Daily   calcitonin (salmon)  1 spray Alternating Nares Daily   carvedilol  25 mg Oral BID WC   Chlorhexidine Gluconate Cloth  6 each Topical Daily   doxycycline  100 mg Oral Q12H   enoxaparin (LOVENOX) injection  40 mg Subcutaneous Q24H   FLUoxetine  60 mg Oral Daily   fluticasone furoate-vilanterol  1 puff Inhalation Daily   And   umeclidinium bromide  1 puff Inhalation Daily   montelukast  10 mg Oral QHS   nicotine  21 mg Transdermal Daily   pantoprazole  40 mg Oral Daily   pregabalin  25 mg Oral TID   QUEtiapine  25 mg Oral  BID    PRN meds: acetaminophen **OR** acetaminophen, albuterol, ipratropium-albuterol, ondansetron **OR** ondansetron (ZOFRAN) IV, polyethylene glycol, polyvinyl alcohol   Infusions:   ceFEPime (MAXIPIME) IV 2 g (07/30/22 0912)   vancomycin 750 mg (07/30/22 1140)    Antimicrobials: Anti-infectives (From admission, onward)    Start     Dose/Rate Route Frequency Ordered Stop   07/29/22 1200  vancomycin (VANCOREADY) IVPB 750 mg/150 mL        750 mg 150 mL/hr over 60 Minutes Intravenous Every 24 hours 07/28/22 2140     07/29/22 1000  ceFEPIme (MAXIPIME) 2 g in sodium chloride 0.9 % 100 mL IVPB        2 g 200 mL/hr over 30 Minutes Intravenous Every 12 hours 07/28/22 2140     07/29/22 1000  doxycycline (VIBRA-TABS) tablet 100 mg        100 mg Oral Every 12 hours 07/29/22 0750     07/28/22 2130  vancomycin (VANCOCIN) IVPB 1000 mg/200 mL premix        1,000 mg 200 mL/hr over 60 Minutes Intravenous  Once 07/28/22 2128 07/29/22 0701   07/28/22 2130  ceFEPIme (MAXIPIME) 2  g in sodium chloride 0.9 % 100 mL IVPB        2 g 200 mL/hr over 30 Minutes Intravenous  Once 07/28/22 2128 07/29/22 0045   07/28/22 1945  cefTRIAXone (ROCEPHIN) 2 g in sodium chloride 0.9 % 100 mL IVPB  Status:  Discontinued        2 g 200 mL/hr over 30 Minutes Intravenous Every 24 hours 07/28/22 1941 07/28/22 2121       Nutritional status:  Body mass index is 20.24 kg/m.          Objective: Vitals:   07/30/22 0355 07/30/22 0847  BP: (!) 129/95   Pulse: 97   Resp: 18   Temp: 97.7 F (36.5 C)   SpO2: 94% 96%    Intake/Output Summary (Last 24 hours) at 07/30/2022 1355 Last data filed at 07/30/2022 1257 Gross per 24 hour  Intake 1944.03 ml  Output 3726 ml  Net -1781.97 ml   Filed Weights   07/28/22 1329  Weight: 47 kg   Weight change:  Body mass index is 20.24 kg/m.   Physical Exam: General exam: Pleasant, elderly Female.  Not in physical pain Skin: No rashes, lesions or ulcers. HEENT: Atraumatic, normocephalic, no obvious bleeding Lungs: Clear to auscultation bilaterally CVS: Regular rate and rhythm, no murmur GI/Abd soft, nontender, nondistended, bowel sound present CNS: Alert, awake, confused, making involuntary movements.  On restraints Psychiatry: Frustrated because of inability to discharge today Extremities: No pedal edema, no calf tenderness  Data Review: I have personally reviewed the laboratory data and studies available.  F/u labs ordered Unresulted Labs (From admission, onward)     Start     Ordered   07/30/22 0500  CBC with Differential/Platelet  Daily,   R      07/29/22 0809   07/30/22 0500  Basic metabolic panel  Daily,   R      07/29/22 0809            Total time spent in review of labs and imaging, patient evaluation, formulation of plan, documentation and communication with family: 45 minutes  Signed, Lorin Glass, MD [facility] 07/30/2022

## 2022-07-30 NOTE — Evaluation (Signed)
Clinical/Bedside Swallow Evaluation Patient Details  Name: MARIVI DURRENCE MRN: [id number] Date of Birth: 02-26-1950  Today's Date: 07/30/2022 Time: SLP Start Time (ACUTE ONLY): 0955 SLP Stop Time (ACUTE ONLY): 1010 SLP Time Calculation (min) (ACUTE ONLY): 15 min  Past Medical History:  Past Medical History:  Diagnosis Date   Acute blood loss anemia (ABLA) 09/05/2020   Allergy    ANXIETY 06/05/2006   Arthritis    SHOULDERS    BACK PAIN 01/05/2010   Cataract    bilateral, left worse   Centrilobular emphysema (HCC)    COPD (chronic obstructive pulmonary disease) (HCC)    Depression    Diabetes mellitus without complication (HCC)    no meds, diet controllled   DIVERTICULOSIS, COLON 06/05/2006   DIZZINESS OR VERTIGO 06/05/2006   positional   Hearing loss in left ear    no hearing aid   History of blood transfusion 08/2020   History of kidney stones    passed stones   HYPERLIPIDEMIA 06/05/2006   Hypertension    HYPOKALEMIA 12/14/2008   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   Qualifier: Diagnosis of  By: Amador Cunas  MD, Janett Labella    Migraine    TOBACCO USER 11/25/2007   Past Surgical History:  Past Surgical History:  Procedure Laterality Date   APPENDECTOMY     BRONCHIAL BIOPSY  09/28/2021   Procedure: BRONCHIAL BIOPSIES;  Surgeon: Omar Person, MD;  Location: [facility] ENDOSCOPY;  Service: Pulmonary;;   BRONCHIAL NEEDLE ASPIRATION BIOPSY  09/28/2021   Procedure: BRONCHIAL NEEDLE ASPIRATION BIOPSIES;  Surgeon: Omar Person, MD;  Location: [facility] ENDOSCOPY;  Service: Pulmonary;;   BRONCHIAL WASHINGS  09/28/2021   Procedure: BRONCHIAL WASHINGS;  Surgeon: Omar Person, MD;  Location: [facility] ENDOSCOPY;  Service: Pulmonary;;   CESAREAN SECTION     x1   CHOLECYSTECTOMY     COLONOSCOPY     COSMETIC SURGERY     CYSTOSCOPY W/ URETERAL STENT PLACEMENT Right 07/29/2022   Procedure: CYSTOSCOPY WITH RETROGRADE PYELOGRAM/URETERAL STENT PLACEMENT;  Surgeon: Malen Gauze, MD;  Location: [facility]  ORS;  Service: Urology;  Laterality: Right;   mastoid surgery  1992   shunt in mastoid- endolymphatic sac in left ear    MRI  07/11/2020   x several, last one located in CE   TUBAL LIGATION     UPPER GI ENDOSCOPY     vocal cord surgery     nodule x2   HPI:  Patient is a 72 y.o. female with PMH: COPD, HTN, HLD, depression, anxiety, infrarenal abdominal aortic aneurysm. She presented to [facility] ED on 07/28/2022  with her sister due to increasing lethargy and a fall in AM. Patient was recently admitted 7/23-24, found to have Klebsiella UTI but she adamantly wanted to leave after day two of admission and was sent home with remaining antibiotics.  In ED, she was found to have multiple SIRS criteria including tachycardia, leukocytosis and clinical evidence of dehydration and encephalopathy. She has been confused and agitated at times, requiring wrist restraints.    Assessment / Plan / Recommendation  Clinical Impression  Patient presenting with what appears to be a cognitive-based dysphagia leading to suspected swallow initiation delay and overall poor awareness to boluses. Patient does not have a h/o oropharyngeal or esophageal dysphagia as per chart review. When SLP entered room, patient pleasant but confused, calling SLP "Rosanne Ashing". She then asked SLP to call "Harriett Sine" who "works the switchboard". She became more agitated during the session, trying to get out of bed,  asking SLP to remove restraints. She accepted a couple sips of thin liquids (water) and a bite of puree solids (applesauce) but then refused any other PO's as she was fixated on trying to get out of the bed. Per RN, patient was able to take her morning medications without significant difficulty. SLP recommending PO diet of Dys 3 solids, thin liquids. Will plan to follow up at least one time to ensure toleration. SLP Visit Diagnosis: Dysphagia, unspecified (R13.10)    Aspiration Risk  Mild aspiration risk    Diet Recommendation Dysphagia 3 (Mech  soft);Thin liquid    Liquid Administration via: Cup;Straw Medication Administration: Whole meds with puree Supervision: Full supervision/cueing for compensatory strategies Compensations: Slow rate;Small sips/bites;Minimize environmental distractions Postural Changes: Seated upright at 90 degrees    Other  Recommendations Oral Care Recommendations: Oral care BID    Recommendations for follow up therapy are one component of a multi-disciplinary discharge planning process, led by the attending physician.  Recommendations may be updated based on patient status, additional functional criteria and insurance authorization.  Follow up Recommendations No SLP follow up      Assistance Recommended at Discharge    Functional Status Assessment Patient has had a recent decline in their functional status and demonstrates the ability to make significant improvements in function in a reasonable and predictable amount of time.  Frequency and Duration min 1 x/week  1 week       Prognosis Prognosis for improved oropharyngeal function: Good Barriers to Reach Goals: Cognitive deficits      Swallow Study   General Date of Onset: 07/29/22 HPI: Patient is a 72 y.o. female with PMH: COPD, HTN, HLD, depression, anxiety, infrarenal abdominal aortic aneurysm. She presented to [facility] ED on 07/28/2022  with her sister due to increasing lethargy and a fall in AM. Patient was recently admitted 7/23-24, found to have Klebsiella UTI but she adamantly wanted to leave after day two of admission and was sent home with remaining antibiotics.  In ED, she was found to have multiple SIRS criteria including tachycardia, leukocytosis and clinical evidence of dehydration and encephalopathy. She has been confused and agitated at times, requiring wrist restraints. Type of Study: Bedside Swallow Evaluation Previous Swallow Assessment: none found Diet Prior to this Study: NPO Temperature Spikes Noted: No Respiratory Status: Room  air History of Recent Intubation: No Behavior/Cognition: Alert;Confused;Cooperative;Requires cueing;Distractible Oral Cavity Assessment: Other (comment) (patient would not allow for it. Appears with limited and poor quality dentition) Oral Care Completed by SLP: No Oral Cavity - Dentition: Poor condition;Missing dentition Self-Feeding Abilities: Total assist Patient Positioning: Upright in bed Baseline Vocal Quality: Normal Volitional Cough: Cognitively unable to elicit Volitional Swallow: Unable to elicit    Oral/Motor/Sensory Function Overall Oral Motor/Sensory Function: Within functional limits   Ice Chips     Thin Liquid Thin Liquid: Impaired Presentation: Straw Oral Phase Impairments: Poor awareness of bolus Pharyngeal  Phase Impairments: Suspected delayed Swallow    Nectar Thick     Honey Thick     Puree Puree: Within functional limits Presentation: Spoon   Solid     Solid: Not tested      Angela Nevin, MA, CCC-SLP Speech Therapy

## 2022-07-30 NOTE — Progress Notes (Signed)
1 Day Post-Op Subjective: Pt very confused. Pleasant and in restraints.   Objective: Vital signs in last 24 hours: Temp:  [97.3 F (36.3 C)-97.7 F (36.5 C)] 97.7 F (36.5 C) (07/29 0355) Pulse Rate:  [85-97] 97 (07/29 0355) Resp:  [15-21] 18 (07/29 0355) BP: (129-164)/(61-95) 129/95 (07/29 0355) SpO2:  [94 %-100 %] 96 % (07/29 0847)  Intake/Output from previous day: 07/28 0701 - 07/29 0700 In: 2164 [P.O.:120; I.V.:1594.1; IV Piggyback:449.9] Out: 2826 [Urine:2825; Blood:1]  Intake/Output this shift: Total I/O In: -  Out: 1300 [Urine:1300]  Physical Exam:  General: Alert and oriented CV: No cyanosis Lungs: equal chest rise Gu: Foley in place draining clear yellow urine  Lab Results: Recent Labs    07/28/22 1845 07/29/22 0406 07/30/22 0426  HGB 11.3* 11.3* 10.8*  HCT 34.7* 35.3* 34.4*   BMET Recent Labs    07/29/22 0406 07/30/22 0426  NA 131* 130*  K 3.1* 3.9  CL 97* 100  CO2 22 19*  GLUCOSE 80 100*  BUN <5* 5*  CREATININE 0.41* 0.44  CALCIUM 7.4* 7.5*     Studies/Results: DG C-Arm 1-60 Min-No Report  Result Date: 07/29/2022 Fluoroscopy was utilized by the requesting physician.  No radiographic interpretation.   CT ABDOMEN PELVIS W CONTRAST  Result Date: 07/29/2022 CLINICAL DATA:  Sepsis and abdominal pain.  Fall injury yesterday. EXAM: CT ABDOMEN AND PELVIS WITH CONTRAST TECHNIQUE: Multidetector CT imaging of the abdomen and pelvis was performed using the standard protocol following bolus administration of intravenous contrast. RADIATION DOSE REDUCTION: This exam was performed according to the departmental dose-optimization program which includes automated exposure control, adjustment of the mA and/or kV according to patient size and/or use of iterative reconstruction technique. CONTRAST:  80mL OMNIPAQUE IOHEXOL 300 MG/ML  SOLN COMPARISON:  CT with IV contrast 5 days ago 07/24/2022, PET-CT 09/11/2021 FINDINGS: Lower chest: A 9 mm right lower lobe  nodule is again noted, unchanged since the PET-CT, first seen on chest CT 08/26/2022 and was not seen on older chest CTs before that. Continued follow-up imaging is recommended or tissue sampling. There is continued airspace disease in the medial segment of the right middle lobe, slightly improved but still present. Remaining lung bases are emphysematous and otherwise clear. The cardiac size is normal. There are three-vessel coronary artery calcifications heaviest in the LAD. Hepatobiliary: The liver is moderately steatotic, including with focal periligamentous fat in the left lobe. There is a calcified granuloma in the hepatic dome. There is no mass enhancement. The gallbladder is absent without biliary dilatation. Pancreas: No abnormality. Spleen: No abnormality. Adrenals/Urinary Tract: There is no adrenal mass. There is a 6 mm hypodensity in the posterior right kidney which is too small to characterize but classified as a Bosniak 2 cyst with no follow-up imaging recommended. Rest of both kidneys enhance homogeneously. On the right there is now mild hydroureteronephrosis and a 3 mm UVJ stone which was previously in the upper pole collecting system. No left nephrolithiasis is seen or other ureteral stones. Bladder wall is unremarkable. Stomach/Bowel: No dilatation or wall thickening. An appendix is not seen in this patient. Scattered uncomplicated sigmoid diverticulosis. Vascular/Lymphatic: Moderate to heavy aortoiliac atherosclerosis with branch vessel disease, again noted 4.2 cm fusiform infrarenal AAA. No evidence of aortic dissection or aneurysmal leakage. Reproductive: Uterus and bilateral adnexa are unremarkable. Other: There are tiny umbilical and infraumbilical midline anterior wall fat hernias. There is no incarcerated hernia. There is a subcutaneous air pocket in the right anterior mid abdomen most  likely due to a recent injection. There is no free fluid, free hemorrhage or free air, or pneumatosis.  Musculoskeletal: There are chronic wedge compression fractures treated with kyphoplasty at T10-L1. Osteopenia and degenerative change. IMPRESSION: 1. 3 mm right UVJ stone, 5 days ago was in the upper pole collecting system, with mild obstructive uropathy. 2. 9 mm right lower lobe nodule, unchanged since PET-CT 09/12/2022 but not seen on older chest CTS. Continued imaging follow-up or tissue sampling recommended. 3. Continued airspace disease in the medial segment of the right middle lobe, slightly improved but still present. 4. Aortic and coronary artery atherosclerosis. 5. 4.2 cm fusiform infrarenal AAA. 6. Moderate hepatic steatosis. 7. Uncomplicated sigmoid diverticulosis. 8. Osteopenia and degenerative change. Chronic compression fractures treated with kyphoplasty at T10-L1. Electronically Signed   By: Almira Bar M.D.   On: 07/29/2022 02:26   DG Chest Port 1 View  Result Date: 07/28/2022 CLINICAL DATA:  Sepsis. EXAM: PORTABLE CHEST 1 VIEW COMPARISON:  July 24, 2022. FINDINGS: The heart size and mediastinal contours are within normal limits. Both lungs are clear. The visualized skeletal structures are unremarkable. IMPRESSION: No active disease. Electronically Signed   By: Lupita Raider M.D.   On: 07/28/2022 20:35   CT Cervical Spine Wo Contrast  Result Date: 07/28/2022 CLINICAL DATA:  Trauma, fall EXAM: CT CERVICAL SPINE WITHOUT CONTRAST TECHNIQUE: Multidetector CT imaging of the cervical spine was performed without intravenous contrast. Multiplanar CT image reconstructions were also generated. RADIATION DOSE REDUCTION: This exam was performed according to the departmental dose-optimization program which includes automated exposure control, adjustment of the mA and/or kV according to patient size and/or use of iterative reconstruction technique. COMPARISON:  None Available. FINDINGS: Alignment: There is minimal anterolisthesis at C3-C4 level, possibly due to previous ligament injury. Skull base and  vertebrae: No recent fracture is seen. Degenerative changes are noted at multiple levels. Soft tissues and spinal canal: There is extrinsic pressure over the ventral margin of thecal sac caused by posterior bony spurs and bulging of the annulus at multiple levels, more prominent at C4-C5 and C5-C6 levels. Calcifications are seen adjacent to the lamina in C4 and C5 vertebrae causing mild extrinsic pressure over the dorsal margin of the thecal sac. Disc levels: There is encroachment of neural foramina from C4-C7 levels. Upper chest: Linear densities are seen in both apices suggesting possible scarring. Blebs and bullae are seen in the apices. Other: Scattered arterial calcifications are seen. IMPRESSION: No recent fracture is seen in cervical spine. Cervical spondylosis with encroachment of neural foramina from C4-C7 levels. Electronically Signed   By: Ernie Avena M.D.   On: 07/28/2022 17:04   CT Head Wo Contrast  Result Date: 07/28/2022 CLINICAL DATA:  Trauma, fall EXAM: CT HEAD WITHOUT CONTRAST TECHNIQUE: Contiguous axial images were obtained from the base of the skull through the vertex without intravenous contrast. RADIATION DOSE REDUCTION: This exam was performed according to the departmental dose-optimization program which includes automated exposure control, adjustment of the mA and/or kV according to patient size and/or use of iterative reconstruction technique. COMPARISON:  07/11/2020 FINDINGS: Brain: No acute intracranial findings are seen. There are no signs of bleeding within the cranium. Cortical sulci are prominent. Vascular: Unremarkable. Skull: No fracture is seen in calvarium. Sinuses/Orbits: There are no air-fluid levels in paranasal sinuses. Orbits are unremarkable. Other: None. IMPRESSION: No acute intracranial findings are seen in noncontrast CT brain. Atrophy. Electronically Signed   By: Ernie Avena M.D.   On: 07/28/2022 16:59  DG Pelvis 1-2 Views  Result Date:  07/28/2022 CLINICAL DATA:  Pain after fall EXAM: PELVIS - 1 VIEW COMPARISON:  None Available. FINDINGS: Osteopenia. Hyperostosis. Mild sclerosis along the sacroiliac joints. Scattered vascular calcifications. With this level of osteopenia subtle nondisplaced injury is difficult to completely exclude and if needed additional cross-sectional imaging as clinically directed. IMPRESSION: Osteopenia. Degenerative changes of the sacroiliac joints. Hyperostosis. Electronically Signed   By: Karen Kays M.D.   On: 07/28/2022 16:42   DG Tibia/Fibula Left  Result Date: 07/28/2022 CLINICAL DATA:  Pain after fall EXAM: LEFT TIBIA AND FIBULA - 2 VIEW COMPARISON:  None Available. FINDINGS: No fracture or dislocation. Preserved adjacent joint spaces. There is chondrocalcinosis about the medial and lateral compartments of the knee. Osteopenia. IMPRESSION: No acute osseous abnormality.  Osteopenia.  Chondrocalcinosis. Electronically Signed   By: Karen Kays M.D.   On: 07/28/2022 16:41   DG Forearm Right  Result Date: 07/28/2022 CLINICAL DATA:  Pain after fall EXAM: RIGHT FOREARM - 2 VIEW; RIGHT HAND - COMPLETE 3 VIEW COMPARISON:  None Available. FINDINGS: Osteopenia. No fracture or dislocation. Chondrocalcinosis about the triangular fibrocartilage of the wrist. Scattered degenerative changes of the hand including joint space loss and small osteophytes of the distal interphalangeal joints diffusely IMPRESSION: Osteopenia.  Chondrocalcinosis about the wrist. Degenerative changes of the hand particularly the distal interphalangeal joints. Electronically Signed   By: Karen Kays M.D.   On: 07/28/2022 16:40   DG Hand Complete Right  Result Date: 07/28/2022 CLINICAL DATA:  Pain after fall EXAM: RIGHT FOREARM - 2 VIEW; RIGHT HAND - COMPLETE 3 VIEW COMPARISON:  None Available. FINDINGS: Osteopenia. No fracture or dislocation. Chondrocalcinosis about the triangular fibrocartilage of the wrist. Scattered degenerative changes of  the hand including joint space loss and small osteophytes of the distal interphalangeal joints diffusely IMPRESSION: Osteopenia.  Chondrocalcinosis about the wrist. Degenerative changes of the hand particularly the distal interphalangeal joints. Electronically Signed   By: Karen Kays M.D.   On: 07/28/2022 16:40   DG Forearm Left  Result Date: 07/28/2022 CLINICAL DATA:  Pain after fall EXAM: LEFT FOREARM - 2 VIEW COMPARISON:  None Available. FINDINGS: Osteopenia. No fracture or dislocation. Preserved adjacent joint spaces. IMPRESSION: No acute osseous abnormality.  Osteopenia Electronically Signed   By: Karen Kays M.D.   On: 07/28/2022 16:38    Assessment/Plan: #right ureteral stone S/p r stent placement with McKinzie Remove foley today. Preserved renal function.  Confused without clear etiology.  Schedulers contacted to set her up for ureteroscopy Will follow peripherally. Please call with questions   LOS: 2 days   Elmon Kirschner, NP [facility] Pager: [phone]  07/30/2022, 1:40 PM

## 2022-07-31 DIAGNOSIS — A419 Sepsis, unspecified organism: Secondary | ICD-10-CM | POA: Diagnosis not present

## 2022-07-31 MED ORDER — POTASSIUM CHLORIDE CRYS ER 20 MEQ PO TBCR
40.0000 meq | EXTENDED_RELEASE_TABLET | Freq: Once | ORAL | Status: DC
Start: 2022-07-31 — End: 2022-07-31

## 2022-07-31 MED ORDER — POTASSIUM CHLORIDE 20 MEQ PO PACK
40.0000 meq | PACK | Freq: Once | ORAL | Status: AC
  Administered 2022-07-31: 40 meq via ORAL
  Filled 2022-07-31: qty 2

## 2022-07-31 MED ORDER — CEPHALEXIN 500 MG PO CAPS: 500.0000 mg | ORAL_CAPSULE | Freq: Three times a day (TID) | ORAL | 0 refills | Status: AC

## 2022-07-31 MED ORDER — POTASSIUM CHLORIDE 10 MEQ/100ML IV SOLN
10.0000 meq | INTRAVENOUS | Status: DC
  Administered 2022-07-31: 10 meq via INTRAVENOUS
  Filled 2022-07-31 (×2): qty 100

## 2022-07-31 MED ORDER — QUETIAPINE FUMARATE 25 MG PO TABS: 25.0000 mg | ORAL_TABLET | Freq: Two times a day (BID) | ORAL | 0 refills | Status: DC | PRN

## 2022-07-31 MED ORDER — CEPHALEXIN 500 MG PO CAPS
500.0000 mg | ORAL_CAPSULE | Freq: Three times a day (TID) | ORAL | Status: DC
  Administered 2022-07-31: 500 mg via ORAL
  Filled 2022-07-31: qty 1

## 2022-07-31 MED ORDER — RISAQUAD PO CAPS
1.0000 | ORAL_CAPSULE | Freq: Every day | ORAL | Status: DC
  Administered 2022-07-31: 1 via ORAL
  Filled 2022-07-31: qty 1

## 2022-07-31 MED ORDER — RISAQUAD PO CAPS: 1.0000 | ORAL_CAPSULE | Freq: Every day | ORAL | 0 refills | Status: AC

## 2022-07-31 NOTE — TOC Progression Note (Addendum)
Transition of Care [facility]) - Progression Note    Patient Details  Name: Allison Jimenez MRN: [id number] Date of Birth: May 20, 1950  Transition of Care [facility]) CM/SW Contact  Howell Rucks, RN Phone Number: 07/31/2022, 10:49 AM  Clinical Narrative:  Call to pt's sister, Allison Jimenez, introduced role of TOC/NCM and reviewed for dc planning, PT recommendation for short term rehab-SNF. Allison Jimenez states she is on her way to hospital and would like to speak with pt's attending for medical update before making any decisions. TOC will continue to follow.    -1:20pm. Met with pt's sister Allison Jimenez) at bedside, reports plan is for pt to dc to home today with Allison Jimenez (reports she is a Engineer, civil (consulting) and has been caring for patient at home for about a year)with HH PT, no preference. Allison Jimenez confirmed pt has a rollator and standard walker at home. Allison Jimenez confirmed will transport patient home. No further TOC needs identified.   -1:28pm [facility] accepted for [facility] PT.  rep-Amy,           Expected Discharge Plan and Services                                               Social Determinants of Health (SDOH) Interventions SDOH Screenings   Food Insecurity: No Food Insecurity (07/29/2022)  Housing: Low Risk  (07/29/2022)  Transportation Needs: No Transportation Needs (07/29/2022)  Utilities: Not At Risk (07/29/2022)  Alcohol Screen: Low Risk  (09/16/2020)  Depression (PHQ2-9): Low Risk  (01/31/2022)  Social Connections: Unknown (05/12/2021)   Received from [facility]  Tobacco Use: High Risk (07/28/2022)    Readmission Risk Interventions     No data to display

## 2022-07-31 NOTE — Plan of Care (Signed)
  Problem: Safety: Goal: Ability to remain free from injury will improve Outcome: Progressing   

## 2022-07-31 NOTE — Discharge Summary (Signed)
Physician Discharge Summary  INOCENCIA Jimenez MVH:[id number] DOB: Jan 06, 1950 DOA: 07/28/2022  PCP: Blane Ohara, MD  Admit date: 07/28/2022 Discharge date: 07/31/2022  Admitted From: Home Discharge disposition: Home with home health PT  Recommendations at discharge:  Complete the course of Keflex, probiotics Seroquel BID PRN for agitaiton    Brief narrative: Allison Jimenez is a 72 y.o. female with PMH significant for DM2, HTN, HLD, chronic smoking, COPD, anxiety/depression, arthritis, renal stones, infrarenal AAA. 7/27, patient was brought to the ED from home after a fall while getting out of bed in the morning.  She hit right side of her head on the dresser sustaining a bruise.   Of note, patient was recently hospitalized 7/23 until 7/24.  Patient had presented with nausea, vomiting, abdominal pain, diagnosed to have Klebsiella UTI treated with IV Rocephin, felt better and was discharged at request on the very next day to to complete remaining course of antibiotics with oral Keflex.  Since discharge, patient continued to have persistent abdominal pain, vomiting, poor oral intake, leading to generalized weakness and lethargy and ultimately a fall as described above.  In the ED, patient was afebrile, initially heart rate 78 but in few hours heart rate was 120s, blood pressure stable, breathing on room air. Labs with WBC count 15.9, hemoglobin 11.3, sodium 131, potassium 2.8 Urinalysis with clear yellow urine, negative leukocytes, nitrate or bacteria CT head, CT cervical spine without evidence of fracture or acute injury Chest x-ray unremarkable Skeletal survey of extremities negative for fracture. Respiratory virus panel unremarkable Blood culture were sent Admitted to [facility] Because of persistent abdominal pain, CT abdomen pelvis was repeated. It showed a 3 mm right UVJ stone, 5 days ago was in the upper pole collecting system, with mild obstructive uropathy. It also showed continued  airspace disease in the medial segment of the right middle lobe slightly improved but still present. Urologist Dr. Ronne Binning was consulted.  Subjective: Patient was seen and examined this morning. Calm and able to answer orientation questions for me but slow to respond.  Her sister came later this morning.  Patient was able to walk on the hallway with her sister and PT. I had a conversation with her sister.  Patient has some degree of confusion at baseline.  Sister thinks it is manageable at home with current mental status today.  Assessment and plan: Sepsis POA Klebsiella UTI Mild right UVJ obstruction Recently diagnosed with Klebsiella UTI, given 1 day of IV Rocephin and discharged at request on oral Keflex  Presented with persistent symptoms, lethargy, fall Noted to have tachycardia, leukocytosis, altered mental status Repeat urinalysis unremarkable.   Urine culture sent.  Blood culture sent.  No growth so far. She was started on broad-spectrum antibiotics. Repeat abdominal CT showed 3 mm right UVJ stone with mild obstruction.  Urology Dr. Ronne Binning has been consulted. 7/21 she underwent cystoscopy with right pyelography, fluoroscopy.  Per operative note, there is a right distal ureteral stone with hydronephrosis.  Right double-J stent was placed.  Patient needs stone extraction in 2 weeks. Foley catheter was placed.  Removed on 7/29.  Passed voiding trial. Discharged on 5 more days of oral Keflex with probiotics. Recent Labs  Lab 07/24/22 1601 07/24/22 1820 07/25/22 0554 07/28/22 1845 07/28/22 1940 07/28/22 2000 07/29/22 0406 07/30/22 0426 07/31/22 0359  WBC  --   --  12.2* 15.9*  --   --  14.5* 10.2 14.3*  LATICACIDVEN 1.4 0.9  --   --  0.9  --   --   --   --  PROCALCITON  --   --   --   --   --  5.74  --   --   --    RML pneumonia COPD Current daily smoker CT scan from 7/23 had shown RML airspace opacities.  Repeat CT scan this admission showed slight improvement Given  her pre-existing COPD and new complaint of shortness of breath, lethargy and elevated procalcitonin, she was suspected of pneumonia as well.  Doxycycline was added to the regimen.  Complete the course.   Continue bronchodilators, Mucinex, incentive spirometry Counseled to quit smoking.     Acute metabolic encephalopathy Confused and at times agitated Multifactorial: Cerebral atrophy in CT scan, acute illness, anesthesia, hospitalization Mental status improved with Seroquel 25 mg twice daily. Continue same as needed   Generalized weakness Fall at home Skeletal survey negative for any fractures PT eval obtained.  Home with PT recommended.   Hypokalemia/hypomagnesemia Potassium and magnesium relation given. Recent Labs  Lab 07/25/22 0554 07/28/22 1845 07/28/22 2000 07/29/22 0406 07/30/22 0426 07/31/22 0359  K 2.9* 2.8*  --  3.1* 3.9 3.3*  MG  --   --  1.3* 1.3*  --   --   PHOS  --   --   --   --  3.1  --     Chronic hyponatremia Patient seems to have baseline low sodium level between 130 and 135.   Currently also volume depleted. Monitor with IV fluids. Recent Labs  Lab 07/24/22 1519 07/25/22 0554 07/28/22 1845 07/29/22 0406 07/30/22 0426 07/31/22 0359  NA 130* 131* 131* 131* 130* 132*    Essential hypertension Heart rate and blood pressure controlled on Coreg  Aortic and coronary artery atherosclerosis  infrarenal AAA HLD 4.2 cm infrarenal AAA identified on CT abdomen 07/24/2022. Outpatient follow-up PTA meds-Lipitor  Hypothyroidism Does not seem to be on Synthroid currently TSH most recent in January was normal.  Repeat ordered.  TSH this hospitalization is elevated to 8. Recent Labs    08/28/21 1537 01/25/22 1111 07/29/22 1501  TSH 1.430 2.620 7.922*   RLL lung nodule  CT scan noted 9 mm right lower lobe nodule, unchanged  Continued imaging follow-up or tissue sampling recommended.  Type 2 diabetes mellitus A1c 6.4 in January 2024 PTA not on  meds Blood sugar level stable.  Moderate hepatic steatosis  Uncomplicated sigmoid diverticulosis Regular bowel regimen  Osteopenia Chronic compression fractures Prior kyphoplasty at T10-L1.  Anxiety/depression Xanax as needed   Goals of care   Code Status: DNR/DNI.    Wounds:  - Pressure Injury 07/29/22 Heel Left;Right Stage 1 -  Intact skin with non-blanchable redness of a localized area usually over a bony prominence. (Active)  Date First Assessed/Time First Assessed: 07/29/22 0325   Location: Heel  Location Orientation: Left;Right  Staging: Stage 1 -  Intact skin with non-blanchable redness of a localized area usually over a bony prominence.  Present on Admission: Yes    Assessments 07/29/2022  3:10 AM 07/31/2022  8:13 AM  Dressing Type Foam - Lift dressing to assess site every shift Foam - Lift dressing to assess site every shift  Dressing Clean, Dry, Intact Clean, Dry, Intact  Dressing Change Frequency PRN --  Site / Wound Assessment Clean;Dry --  Margins -- Attached edges (approximated)     No associated orders.     Wound / Incision (Open or Dehisced) 07/29/22 Skin tear Pretibial Left;Lower 3*1 (Active)  Date First Assessed/Time First Assessed: 07/29/22 0326   Wound Type: Skin tear  Location: Pretibial  Location Orientation: Left;Lower  Wound Description (Comments): 3*1  Present on Admission: Yes    Assessments 07/29/2022  3:10 AM 07/31/2022  8:13 AM  Dressing Type Foam - Lift dressing to assess site every shift Foam - Lift dressing to assess site every shift;Tape dressing  Dressing Changed New --  Dressing Status Clean, Dry, Intact Clean, Dry, Intact  Dressing Change Frequency PRN PRN  Site / Wound Assessment Dry Dressing in place / Unable to assess  Wound Length (cm) 3 cm --  Wound Width (cm) 1 cm --  Wound Depth (cm) 0 cm --  Wound Volume (cm^3) 0 cm^3 --  Wound Surface Area (cm^2) 3 cm^2 --     No associated orders.     Wound / Incision (Open or Dehisced)  07/29/22 Skin tear Tibial Left;Posterior;Lower (Active)  Date First Assessed/Time First Assessed: 07/29/22 0328   Wound Type: Skin tear  Location: Tibial  Location Orientation: Left;Posterior;Lower  Present on Admission: Yes    Assessments 07/29/2022  3:10 AM 07/31/2022  8:13 AM  Dressing Type Foam - Lift dressing to assess site every shift Impregnated gauze (petrolatum);Foam - Lift dressing to assess site every shift  Dressing Changed New --  Dressing Status Clean, Dry, Intact Clean, Dry, Intact  Dressing Change Frequency PRN PRN  Site / Wound Assessment Dry Dressing in place / Unable to assess  Wound Length (cm) 3 cm --  Wound Width (cm) 3 cm --  Wound Depth (cm) 0 cm --  Wound Volume (cm^3) 0 cm^3 --  Wound Surface Area (cm^2) 9 cm^2 --     No associated orders.     Wound / Incision (Open or Dehisced) 07/29/22 Skin tear Knee Anterior;Left (Active)  Date First Assessed/Time First Assessed: 07/29/22 0329   Wound Type: Skin tear  Location: Knee  Location Orientation: Anterior;Left  Present on Admission: Yes    Assessments 07/29/2022  3:10 AM 07/31/2022  8:13 AM  Dressing Type Foam - Lift dressing to assess site every shift Foam - Lift dressing to assess site every shift  Dressing Changed New --  Dressing Status Clean, Dry, Intact Clean, Dry, Intact  Dressing Change Frequency PRN PRN  Site / Wound Assessment Dry --  Wound Length (cm) 3 cm --  Wound Width (cm) 3.5 cm --  Wound Depth (cm) 0 cm --  Wound Volume (cm^3) 0 cm^3 --  Wound Surface Area (cm^2) 10.5 cm^2 --     No associated orders.     Wound / Incision (Open or Dehisced) 07/29/22 Skin tear Elbow Posterior;Right (Active)  Date First Assessed/Time First Assessed: 07/29/22 0329   Wound Type: Skin tear  Location: Elbow  Location Orientation: Posterior;Right  Present on Admission: Yes    Assessments 07/29/2022  3:10 AM 07/31/2022  8:13 AM  Dressing Type Foam - Lift dressing to assess site every shift Impregnated gauze  (petrolatum);Foam - Lift dressing to assess site every shift  Dressing Changed New --  Dressing Status Clean, Dry, Intact Clean, Dry, Intact  Dressing Change Frequency PRN PRN  Site / Wound Assessment Dry --  Wound Length (cm) 3 cm --  Wound Width (cm) 1 cm --  Wound Depth (cm) 0 cm --  Wound Volume (cm^3) 0 cm^3 --  Wound Surface Area (cm^2) 3 cm^2 --     No associated orders.     Wound / Incision (Open or Dehisced) 07/29/22 Skin tear Hand Posterior;Right;Lateral bleeding flap (Active)  Date First Assessed/Time First Assessed: 07/29/22 1545  Wound Type: Skin tear  Location: Hand  Location Orientation: Posterior;Right;Lateral  Wound Description (Comments): bleeding flap  Present on Admission: No    Assessments 07/29/2022  6:21 PM 07/31/2022  8:13 AM  Dressing Type Impregnated gauze (petrolatum);Foam - Lift dressing to assess site every shift Foam - Lift dressing to assess site every shift  Dressing Changed New --  Dressing Status Intact;Clean, Dry, Intact Clean, Dry, Intact  Dressing Change Frequency PRN PRN  Margins Unattached edges (unapproximated) --  Drainage Amount Scant --  Drainage Description Sanguineous --  Treatment Other (Comment) --     No associated orders.     Wound / Incision (Open or Dehisced) 07/29/22 Skin tear Hand Posterior;Right;Medial bleeding flap (patient removed IV and adhesive tape) (Active)  Date First Assessed/Time First Assessed: 07/29/22 1545   Wound Type: Skin tear  Location: Hand  Location Orientation: Posterior;Right;Medial  Wound Description (Comments): bleeding flap (patient removed IV and adhesive tape)  Present on Admission: No    Assessments 07/29/2022  6:21 PM 07/31/2022  8:13 AM  Dressing Type Impregnated gauze (petrolatum);Foam - Lift dressing to assess site every shift Foam - Lift dressing to assess site every shift  Dressing Changed New --  Dressing Status Clean, Dry, Intact Clean, Dry, Intact  Dressing Change Frequency PRN PRN  Margins  Unattached edges (unapproximated) --  Drainage Amount Scant --  Drainage Description Sanguineous --  Treatment Cleansed --     No associated orders.    Discharge Exam:   Vitals:   07/30/22 1449 07/30/22 2056 07/31/22 0559 07/31/22 0758  BP: (!) 141/96 (!) 172/90 (!) 150/99   Pulse: 92 100 100   Resp: 20 18 18    Temp: (!) 97.4 F (36.3 C) (!) 97.5 F (36.4 C) 98.5 F (36.9 C)   TempSrc: Oral Oral Oral   SpO2: 100% 97% 97% 95%  Weight:      Height:        Body mass index is 20.24 kg/m.   General exam: Pleasant, elderly Female.  Not in physical pain Skin: No rashes, lesions or ulcers. HEENT: Atraumatic, normocephalic, no obvious bleeding Lungs: Clear to auscultation bilaterally CVS: Regular rate and rhythm, no murmur GI/Abd soft, nontender, nondistended, bowel sound present CNS: Alert, awake, confusion improving. Psychiatry: Mood appropriate  extremities: No pedal edema, no calf tenderness  Follow ups:    Follow-up Information     Cox, Fritzi Mandes, MD Follow up.   Specialty: Family Medicine Contact information: [address] [place] [address] [phone]                 Discharge Instructions:   Discharge Instructions     Call MD for:  difficulty breathing, headache or visual disturbances   Complete by: As directed    Call MD for:  extreme fatigue   Complete by: As directed    Call MD for:  hives   Complete by: As directed    Call MD for:  persistant dizziness or light-headedness   Complete by: As directed    Call MD for:  persistant nausea and vomiting   Complete by: As directed    Call MD for:  severe uncontrolled pain   Complete by: As directed    Call MD for:  temperature >100.4   Complete by: As directed    Diet general   Complete by: As directed    Discharge instructions   Complete by: As directed    Recommendations at discharge:   Complete the  course of Keflex, probiotics  Seroquel BID PRN for agitaiton  General  discharge instructions: Follow with Primary MD Cox, Fritzi Mandes, MD in 7 days  Please request your PCP  to go over your hospital tests, procedures, radiology results at the follow up. Please get your medicines reviewed and adjusted.  Your PCP may decide to repeat certain labs or tests as needed. Do not drive, operate heavy machinery, perform activities at heights, swimming or participation in water activities or provide baby sitting services if your were admitted for syncope or siezures until you have seen by Primary MD or a Neurologist and advised to do so again. [place] Controlled Substance Reporting System database was reviewed. Do not drive, operate heavy machinery, perform activities at heights, swim, participate in water activities or provide baby-sitting services while on medications for pain, sleep and mood until your outpatient physician has reevaluated you and advised to do so again.  You are strongly recommended to comply with the dose, frequency and duration of prescribed medications. Activity: As tolerated with Full fall precautions use walker/cane & assistance as needed Avoid using any recreational substances like cigarette, tobacco, alcohol, or non-prescribed drug. If you experience worsening of your admission symptoms, develop shortness of breath, life threatening emergency, suicidal or homicidal thoughts you must seek medical attention immediately by calling 911 or calling your MD immediately  if symptoms less severe. You must read complete instructions/literature along with all the possible adverse reactions/side effects for all the medicines you take and that have been prescribed to you. Take any new medicine only after you have completely understood and accepted all the possible adverse reactions/side effects.  Wear Seat belts while driving. You were cared for by a hospitalist during your hospital stay. If you have any questions about your discharge medications or the care you  received while you were in the hospital after you are discharged, you can call the unit and ask to speak with the hospitalist or the covering physician. Once you are discharged, your primary care physician will handle any further medical issues. Please note that NO REFILLS for any discharge medications will be authorized once you are discharged, as it is imperative that you return to your primary care physician (or establish a relationship with a primary care physician if you do not have one).   Discharge wound care:   Complete by: As directed    Increase activity slowly   Complete by: As directed        Discharge Medications:   Allergies as of 07/31/2022       Reactions   Erythromycin Base Diarrhea   Vfend [voriconazole] Other (See Comments)   Visual changes   Penicillins Rash        Medication List     STOP taking these medications    dicyclomine 20 MG tablet Commonly known as: BENTYL   estradiol 0.1 MG/GM vaginal cream Commonly known as: ESTRACE   rizatriptan 10 MG tablet Commonly known as: Maxalt       TAKE these medications    acetaminophen 500 MG tablet Commonly known as: TYLENOL Take 500-1,000 mg by mouth every 6 (six) hours as needed for mild pain, moderate pain or headache.   acidophilus Caps capsule Take 1 capsule by mouth daily for 5 days.   Aimovig 140 MG/ML Soaj Generic drug: Erenumab-aooe Inject 140 mg as directed every 30 (thirty) days.   albuterol 108 (90 Base) MCG/ACT inhaler Commonly known as: VENTOLIN HFA Inhale 2 puffs into the lungs every  6 (six) hours as needed for shortness of breath or wheezing.   ALPRAZolam 0.25 MG tablet Commonly known as: XANAX Take 0.125-0.25 mg by mouth 3 (three) times daily as needed for anxiety.   atorvastatin 20 MG tablet Commonly known as: LIPITOR TAKE 1 TABLET(20 MG) BY MOUTH DAILY What changed: See the new instructions.   calcitonin (salmon) 200 UNIT/ACT nasal spray Commonly known as:  MIACALCIN/FORTICAL Place 1 spray into alternate nostrils daily.   carvedilol 25 MG tablet Commonly known as: COREG Take 1 tablet (25 mg total) by mouth 2 (two) times daily with a meal.   cephALEXin 500 MG capsule Commonly known as: KEFLEX Take 1 capsule (500 mg total) by mouth every 8 (eight) hours for 5 days. What changed: when to take this   cetirizine 10 MG tablet Commonly known as: ZYRTEC Take 10 mg by mouth daily as needed for allergies.   conjugated estrogens vaginal cream Commonly known as: PREMARIN Place 1 applicator vaginally daily as needed (dryness / burning).   cyclobenzaprine 10 MG tablet Commonly known as: FLEXERIL Take 1 tablet (10 mg total) by mouth 3 (three) times daily as needed for muscle spasms.   diclofenac Sodium 1 % Gel Commonly known as: VOLTAREN Apply 1 Application topically 4 (four) times daily as needed (pain).   famotidine 40 MG tablet Commonly known as: PEPCID TAKE 1 TABLET(40 MG) BY MOUTH AT BEDTIME What changed:  how much to take how to take this when to take this additional instructions   FLUoxetine 20 MG capsule Commonly known as: PROZAC Take 3 capsules (60 mg total) by mouth daily. What changed: how much to take   fluticasone 50 MCG/ACT nasal spray Commonly known as: FLONASE SHAKE LIQUID AND USE 2 SPRAYS IN EACH NOSTRIL DAILY What changed: See the new instructions.   ketoconazole 2 % cream Commonly known as: NIZORAL Apply 1 Application topically daily as needed (cracked skin).   linaclotide 145 MCG Caps capsule Commonly known as: Linzess Take 1 capsule (145 mcg total) by mouth daily as needed (constipation).   LUBRICATING EYE DROPS OP Place 1 drop into both eyes daily.   montelukast 10 MG tablet Commonly known as: SINGULAIR TAKE 1 TABLET(10 MG) BY MOUTH AT BEDTIME What changed: See the new instructions.   multivitamin with iron-minerals liquid Take 15 mLs by mouth daily.   oxyCODONE-acetaminophen 10-325 MG  tablet Commonly known as: PERCOCET Take 1 tablet by mouth 4 (four) times daily as needed.   pantoprazole 40 MG tablet Commonly known as: PROTONIX TAKE 1 TABLET(40 MG) BY MOUTH DAILY What changed: See the new instructions.   potassium chloride 10 MEQ CR capsule Commonly known as: MICRO-K Take 2 capsules (20 mEq total) by mouth 2 (two) times daily.   pregabalin 25 MG capsule Commonly known as: LYRICA Take 25 mg by mouth 3 (three) times daily.   promethazine 25 MG tablet Commonly known as: PHENERGAN TAKE 1 TABLET BY MOUTH EVERY 8 HOURS IF NEEDED What changed:  how much to take how to take this when to take this reasons to take this additional instructions   QUEtiapine 25 MG tablet Commonly known as: SEROQUEL Take 1 tablet (25 mg total) by mouth 2 (two) times daily as needed for up to 7 days.   sennosides-docusate sodium 8.6-50 MG tablet Commonly known as: SENOKOT-S Take 1 tablet by mouth daily.   sucralfate 1 g tablet Commonly known as: CARAFATE Take 1 g by mouth 4 (four) times daily.   Trelegy Ellipta 100-62.5-25 MCG/ACT  Aepb Generic drug: Fluticasone-Umeclidin-Vilant Inhale 1 puff into the lungs daily.   vitamin B-12 500 MCG tablet Commonly known as: CYANOCOBALAMIN TAKE 1 TABLET BY MOUTH DAILY AT NOON What changed:  how much to take how to take this when to take this additional instructions               Discharge Care Instructions  (From admission, onward)           Start     Ordered   07/31/22 0000  Discharge wound care:        07/31/22 1327             The results of significant diagnostics from this hospitalization (including imaging, microbiology, ancillary and laboratory) are listed below for reference.    Procedures and Diagnostic Studies:   DG C-Arm 1-60 Min-No Report  Result Date: 07/29/2022 Fluoroscopy was utilized by the requesting physician.  No radiographic interpretation.   CT ABDOMEN PELVIS W CONTRAST  Result Date:  07/29/2022 CLINICAL DATA:  Sepsis and abdominal pain.  Fall injury yesterday. EXAM: CT ABDOMEN AND PELVIS WITH CONTRAST TECHNIQUE: Multidetector CT imaging of the abdomen and pelvis was performed using the standard protocol following bolus administration of intravenous contrast. RADIATION DOSE REDUCTION: This exam was performed according to the departmental dose-optimization program which includes automated exposure control, adjustment of the mA and/or kV according to patient size and/or use of iterative reconstruction technique. CONTRAST:  80mL OMNIPAQUE IOHEXOL 300 MG/ML  SOLN COMPARISON:  CT with IV contrast 5 days ago 07/24/2022, PET-CT 09/11/2021 FINDINGS: Lower chest: A 9 mm right lower lobe nodule is again noted, unchanged since the PET-CT, first seen on chest CT 08/26/2022 and was not seen on older chest CTs before that. Continued follow-up imaging is recommended or tissue sampling. There is continued airspace disease in the medial segment of the right middle lobe, slightly improved but still present. Remaining lung bases are emphysematous and otherwise clear. The cardiac size is normal. There are three-vessel coronary artery calcifications heaviest in the LAD. Hepatobiliary: The liver is moderately steatotic, including with focal periligamentous fat in the left lobe. There is a calcified granuloma in the hepatic dome. There is no mass enhancement. The gallbladder is absent without biliary dilatation. Pancreas: No abnormality. Spleen: No abnormality. Adrenals/Urinary Tract: There is no adrenal mass. There is a 6 mm hypodensity in the posterior right kidney which is too small to characterize but classified as a Bosniak 2 cyst with no follow-up imaging recommended. Rest of both kidneys enhance homogeneously. On the right there is now mild hydroureteronephrosis and a 3 mm UVJ stone which was previously in the upper pole collecting system. No left nephrolithiasis is seen or other ureteral stones. Bladder wall is  unremarkable. Stomach/Bowel: No dilatation or wall thickening. An appendix is not seen in this patient. Scattered uncomplicated sigmoid diverticulosis. Vascular/Lymphatic: Moderate to heavy aortoiliac atherosclerosis with branch vessel disease, again noted 4.2 cm fusiform infrarenal AAA. No evidence of aortic dissection or aneurysmal leakage. Reproductive: Uterus and bilateral adnexa are unremarkable. Other: There are tiny umbilical and infraumbilical midline anterior wall fat hernias. There is no incarcerated hernia. There is a subcutaneous air pocket in the right anterior mid abdomen most likely due to a recent injection. There is no free fluid, free hemorrhage or free air, or pneumatosis. Musculoskeletal: There are chronic wedge compression fractures treated with kyphoplasty at T10-L1. Osteopenia and degenerative change. IMPRESSION: 1. 3 mm right UVJ stone, 5 days ago was in the upper pole  collecting system, with mild obstructive uropathy. 2. 9 mm right lower lobe nodule, unchanged since PET-CT 09/12/2022 but not seen on older chest CTS. Continued imaging follow-up or tissue sampling recommended. 3. Continued airspace disease in the medial segment of the right middle lobe, slightly improved but still present. 4. Aortic and coronary artery atherosclerosis. 5. 4.2 cm fusiform infrarenal AAA. 6. Moderate hepatic steatosis. 7. Uncomplicated sigmoid diverticulosis. 8. Osteopenia and degenerative change. Chronic compression fractures treated with kyphoplasty at T10-L1. Electronically Signed   By: Almira Bar M.D.   On: 07/29/2022 02:26   DG Chest Port 1 View  Result Date: 07/28/2022 CLINICAL DATA:  Sepsis. EXAM: PORTABLE CHEST 1 VIEW COMPARISON:  July 24, 2022. FINDINGS: The heart size and mediastinal contours are within normal limits. Both lungs are clear. The visualized skeletal structures are unremarkable. IMPRESSION: No active disease. Electronically Signed   By: Lupita Raider M.D.   On: 07/28/2022 20:35    CT Cervical Spine Wo Contrast  Result Date: 07/28/2022 CLINICAL DATA:  Trauma, fall EXAM: CT CERVICAL SPINE WITHOUT CONTRAST TECHNIQUE: Multidetector CT imaging of the cervical spine was performed without intravenous contrast. Multiplanar CT image reconstructions were also generated. RADIATION DOSE REDUCTION: This exam was performed according to the departmental dose-optimization program which includes automated exposure control, adjustment of the mA and/or kV according to patient size and/or use of iterative reconstruction technique. COMPARISON:  None Available. FINDINGS: Alignment: There is minimal anterolisthesis at C3-C4 level, possibly due to previous ligament injury. Skull base and vertebrae: No recent fracture is seen. Degenerative changes are noted at multiple levels. Soft tissues and spinal canal: There is extrinsic pressure over the ventral margin of thecal sac caused by posterior bony spurs and bulging of the annulus at multiple levels, more prominent at C4-C5 and C5-C6 levels. Calcifications are seen adjacent to the lamina in C4 and C5 vertebrae causing mild extrinsic pressure over the dorsal margin of the thecal sac. Disc levels: There is encroachment of neural foramina from C4-C7 levels. Upper chest: Linear densities are seen in both apices suggesting possible scarring. Blebs and bullae are seen in the apices. Other: Scattered arterial calcifications are seen. IMPRESSION: No recent fracture is seen in cervical spine. Cervical spondylosis with encroachment of neural foramina from C4-C7 levels. Electronically Signed   By: Ernie Avena M.D.   On: 07/28/2022 17:04   CT Head Wo Contrast  Result Date: 07/28/2022 CLINICAL DATA:  Trauma, fall EXAM: CT HEAD WITHOUT CONTRAST TECHNIQUE: Contiguous axial images were obtained from the base of the skull through the vertex without intravenous contrast. RADIATION DOSE REDUCTION: This exam was performed according to the departmental dose-optimization  program which includes automated exposure control, adjustment of the mA and/or kV according to patient size and/or use of iterative reconstruction technique. COMPARISON:  07/11/2020 FINDINGS: Brain: No acute intracranial findings are seen. There are no signs of bleeding within the cranium. Cortical sulci are prominent. Vascular: Unremarkable. Skull: No fracture is seen in calvarium. Sinuses/Orbits: There are no air-fluid levels in paranasal sinuses. Orbits are unremarkable. Other: None. IMPRESSION: No acute intracranial findings are seen in noncontrast CT brain. Atrophy. Electronically Signed   By: Ernie Avena M.D.   On: 07/28/2022 16:59   DG Pelvis 1-2 Views  Result Date: 07/28/2022 CLINICAL DATA:  Pain after fall EXAM: PELVIS - 1 VIEW COMPARISON:  None Available. FINDINGS: Osteopenia. Hyperostosis. Mild sclerosis along the sacroiliac joints. Scattered vascular calcifications. With this level of osteopenia subtle nondisplaced injury is difficult to completely exclude and  if needed additional cross-sectional imaging as clinically directed. IMPRESSION: Osteopenia. Degenerative changes of the sacroiliac joints. Hyperostosis. Electronically Signed   By: Karen Kays M.D.   On: 07/28/2022 16:42   DG Tibia/Fibula Left  Result Date: 07/28/2022 CLINICAL DATA:  Pain after fall EXAM: LEFT TIBIA AND FIBULA - 2 VIEW COMPARISON:  None Available. FINDINGS: No fracture or dislocation. Preserved adjacent joint spaces. There is chondrocalcinosis about the medial and lateral compartments of the knee. Osteopenia. IMPRESSION: No acute osseous abnormality.  Osteopenia.  Chondrocalcinosis. Electronically Signed   By: Karen Kays M.D.   On: 07/28/2022 16:41   DG Forearm Right  Result Date: 07/28/2022 CLINICAL DATA:  Pain after fall EXAM: RIGHT FOREARM - 2 VIEW; RIGHT HAND - COMPLETE 3 VIEW COMPARISON:  None Available. FINDINGS: Osteopenia. No fracture or dislocation. Chondrocalcinosis about the triangular  fibrocartilage of the wrist. Scattered degenerative changes of the hand including joint space loss and small osteophytes of the distal interphalangeal joints diffusely IMPRESSION: Osteopenia.  Chondrocalcinosis about the wrist. Degenerative changes of the hand particularly the distal interphalangeal joints. Electronically Signed   By: Karen Kays M.D.   On: 07/28/2022 16:40   DG Hand Complete Right  Result Date: 07/28/2022 CLINICAL DATA:  Pain after fall EXAM: RIGHT FOREARM - 2 VIEW; RIGHT HAND - COMPLETE 3 VIEW COMPARISON:  None Available. FINDINGS: Osteopenia. No fracture or dislocation. Chondrocalcinosis about the triangular fibrocartilage of the wrist. Scattered degenerative changes of the hand including joint space loss and small osteophytes of the distal interphalangeal joints diffusely IMPRESSION: Osteopenia.  Chondrocalcinosis about the wrist. Degenerative changes of the hand particularly the distal interphalangeal joints. Electronically Signed   By: Karen Kays M.D.   On: 07/28/2022 16:40   DG Forearm Left  Result Date: 07/28/2022 CLINICAL DATA:  Pain after fall EXAM: LEFT FOREARM - 2 VIEW COMPARISON:  None Available. FINDINGS: Osteopenia. No fracture or dislocation. Preserved adjacent joint spaces. IMPRESSION: No acute osseous abnormality.  Osteopenia Electronically Signed   By: Karen Kays M.D.   On: 07/28/2022 16:38     Labs:   Basic Metabolic Panel: Recent Labs  Lab 07/25/22 0554 07/28/22 1845 07/28/22 2000 07/29/22 0406 07/30/22 0426 07/31/22 0359  NA 131* 131*  --  131* 130* 132*  K 2.9* 2.8*  --  3.1* 3.9 3.3*  CL 102 98  --  97* 100 99  CO2 18* 22  --  22 19* 20*  GLUCOSE 76 88  --  80 100* 84  BUN 8 <5*  --  <5* 5* 9  CREATININE 0.44 0.45  --  0.41* 0.44 0.53  CALCIUM 7.4* 7.4*  --  7.4* 7.5* 8.2*  MG  --   --  1.3* 1.3*  --   --   PHOS  --   --   --   --  3.1  --    GFR Estimated Creatinine Clearance: 47 mL/min (by C-G formula based on SCr of 0.53  mg/dL). Liver Function Tests: Recent Labs  Lab 07/24/22 1519 07/29/22 0406  AST 16 16  ALT 12 19  ALKPHOS 213* 161*  BILITOT 0.5 0.8  PROT 6.0* 5.6*  ALBUMIN 2.9* 2.1*   Recent Labs  Lab 07/24/22 1519  LIPASE 15   No results for input(s): "AMMONIA" in the last 168 hours. Coagulation profile Recent Labs  Lab 07/24/22 1558 07/29/22 0406  INR 1.0 1.1    CBC: Recent Labs  Lab 07/25/22 0554 07/28/22 1845 07/29/22 0406 07/30/22 0426 07/31/22 0359  WBC 12.2* 15.9* 14.5* 10.2 14.3*  NEUTROABS 7.7 11.5* 9.7* 7.6 9.5*  HGB 9.7* 11.3* 11.3* 10.8* 11.7*  HCT 30.3* 34.7* 35.3* 34.4* 36.8  MCV 91.3 88.7 89.8 92.7 89.5  PLT 449* 508* 495* 473* 593*   Cardiac Enzymes: No results for input(s): "CKTOTAL", "CKMB", "CKMBINDEX", "TROPONINI" in the last 168 hours. BNP: Invalid input(s): "POCBNP" CBG: No results for input(s): "GLUCAP" in the last 168 hours. D-Dimer No results for input(s): "DDIMER" in the last 72 hours. Hgb A1c No results for input(s): "HGBA1C" in the last 72 hours. Lipid Profile No results for input(s): "CHOL", "HDL", "LDLCALC", "TRIG", "CHOLHDL", "LDLDIRECT" in the last 72 hours. Thyroid function studies Recent Labs    07/29/22 1501  TSH 7.922*   Anemia work up No results for input(s): "VITAMINB12", "FOLATE", "FERRITIN", "TIBC", "IRON", "RETICCTPCT" in the last 72 hours. Microbiology Recent Results (from the past 240 hour(s))  Resp panel by RT-PCR (RSV, Flu A&B, Covid) Anterior Nasal Swab     Status: None   Collection Time: 07/24/22  3:36 PM   Specimen: Anterior Nasal Swab  Result Value Ref Range Status   SARS Coronavirus 2 by RT PCR NEGATIVE NEGATIVE Final    Comment: (NOTE) SARS-CoV-2 target nucleic acids are NOT DETECTED.  The SARS-CoV-2 RNA is generally detectable in upper respiratory specimens during the acute phase of infection. The lowest concentration of SARS-CoV-2 viral copies this assay can detect is 138 copies/mL. A negative result  does not preclude SARS-Cov-2 infection and should not be used as the sole basis for treatment or other patient management decisions. A negative result may occur with  improper specimen collection/handling, submission of specimen other than nasopharyngeal swab, presence of viral mutation(s) within the areas targeted by this assay, and inadequate number of viral copies(<138 copies/mL). A negative result must be combined with clinical observations, patient history, and epidemiological information. The expected result is Negative.  Fact Sheet for Patients:  BloggerCourse.com  Fact Sheet for Healthcare Providers:  SeriousBroker.it  This test is no t yet approved or cleared by the [place] FDA and  has been authorized for detection and/or diagnosis of SARS-CoV-2 by FDA under an Emergency Use Authorization (EUA). This EUA will remain  in effect (meaning this test can be used) for the duration of the COVID-19 declaration under Section 564(b)(1) of the Act, 21 U.S.C.section 360bbb-3(b)(1), unless the authorization is terminated  or revoked sooner.       Influenza A by PCR NEGATIVE NEGATIVE Final   Influenza B by PCR NEGATIVE NEGATIVE Final    Comment: (NOTE) The Xpert Xpress SARS-CoV-2/FLU/RSV plus assay is intended as an aid in the diagnosis of influenza from Nasopharyngeal swab specimens and should not be used as a sole basis for treatment. Nasal washings and aspirates are unacceptable for Xpert Xpress SARS-CoV-2/FLU/RSV testing.  Fact Sheet for Patients: BloggerCourse.com  Fact Sheet for Healthcare Providers: SeriousBroker.it  This test is not yet approved or cleared by the [place] FDA and has been authorized for detection and/or diagnosis of SARS-CoV-2 by FDA under an Emergency Use Authorization (EUA). This EUA will remain in effect (meaning this test can be used) for  the duration of the COVID-19 declaration under Section 564(b)(1) of the Act, 21 U.S.C. section 360bbb-3(b)(1), unless the authorization is terminated or revoked.     Resp Syncytial Virus by PCR NEGATIVE NEGATIVE Final    Comment: (NOTE) Fact Sheet for Patients: BloggerCourse.com  Fact Sheet for Healthcare Providers: SeriousBroker.it  This test is not yet approved or  cleared by the [place] and has been authorized for detection and/or diagnosis of SARS-CoV-2 by FDA under an Emergency Use Authorization (EUA). This EUA will remain in effect (meaning this test can be used) for the duration of the COVID-19 declaration under Section 564(b)(1) of the Act, 21 U.S.C. section 360bbb-3(b)(1), unless the authorization is terminated or revoked.  Performed at [facility], [address] [place], [place] [address]   Urine Culture     Status: Abnormal   Collection Time: 07/24/22  3:45 PM   Specimen: Urine, Clean Catch  Result Value Ref Range Status   Specimen Description   Final    URINE, CLEAN CATCH Performed at [facility], [address] [place], [place] [address]    Special Requests   Final    NONE Performed at [facility], [address] [place], [place] [address]    Culture   Final    Two isolates with different morphologies were identified as the same organism.The most resistant organism was reported. >=100,000 COLONIES/mL KLEBSIELLA PNEUMONIAE    Report Status 07/27/2022 FINAL  Final   Organism ID, Bacteria KLEBSIELLA PNEUMONIAE (A)  Final      Susceptibility   Klebsiella pneumoniae - MIC*    AMPICILLIN >=32 RESISTANT Resistant     CEFAZOLIN <=4 SENSITIVE Sensitive     CEFEPIME <=0.12 SENSITIVE Sensitive     CEFTRIAXONE <=0.25 SENSITIVE Sensitive     CIPROFLOXACIN <=0.25 SENSITIVE Sensitive     GENTAMICIN <=1 SENSITIVE Sensitive     IMIPENEM <=0.25  SENSITIVE Sensitive     NITROFURANTOIN 64 INTERMEDIATE Intermediate     TRIMETH/SULFA <=20 SENSITIVE Sensitive     AMPICILLIN/SULBACTAM 4 SENSITIVE Sensitive     PIP/TAZO <=4 SENSITIVE Sensitive     * >=100,000 COLONIES/mL KLEBSIELLA PNEUMONIAE  Blood Culture (routine x 2)     Status: Abnormal   Collection Time: 07/24/22  3:58 PM   Specimen: BLOOD  Result Value Ref Range Status   Specimen Description   Final    BLOOD RIGHT ANTECUBITAL Performed at [facility], [address], [place], [place] [address]    Special Requests   Final    BOTTLES DRAWN AEROBIC AND ANAEROBIC Blood Culture adequate volume Performed at [facility], [address] [place], [place] [address]    Culture  Setup Time   Final    GRAM POSITIVE COCCI IN CLUSTERS AEROBIC BOTTLE ONLY CRITICAL RESULT CALLED TO, READ BACK BY AND VERIFIED WITH: DR.A. Pauletta Browns [id number] @ 1733 FH    Culture (A)  Final    STAPHYLOCOCCUS CAPITIS THE SIGNIFICANCE OF ISOLATING THIS ORGANISM FROM A SINGLE SET OF BLOOD CULTURES WHEN MULTIPLE SETS ARE DRAWN IS UNCERTAIN. PLEASE NOTIFY THE MICROBIOLOGY DEPARTMENT WITHIN ONE WEEK IF SPECIATION AND SENSITIVITIES ARE REQUIRED. Performed at [facility], [address], [place], [place] [address]    Report Status 07/26/2022 FINAL  Final  Blood Culture ID Panel (Reflexed)     Status: Abnormal   Collection Time: 07/24/22  3:58 PM  Result Value Ref Range Status   Enterococcus faecalis NOT DETECTED NOT DETECTED Final   Enterococcus Faecium NOT DETECTED NOT DETECTED Final   Listeria monocytogenes NOT DETECTED NOT DETECTED Final   Staphylococcus species DETECTED (A) NOT DETECTED Final    Comment: CRITICAL RESULT CALLED TO, READ BACK BY AND VERIFIED WITH: DR.A. Pauletta Browns [id number] @ 1733 FH     Staphylococcus aureus (BCID) NOT DETECTED NOT DETECTED Final   Staphylococcus epidermidis  NOT DETECTED NOT DETECTED Final   Staphylococcus lugdunensis NOT DETECTED NOT  DETECTED Final   Streptococcus species NOT DETECTED NOT DETECTED Final   Streptococcus agalactiae NOT DETECTED NOT DETECTED Final   Streptococcus pneumoniae NOT DETECTED NOT DETECTED Final   Streptococcus pyogenes NOT DETECTED NOT DETECTED Final   A.calcoaceticus-baumannii NOT DETECTED NOT DETECTED Final   Bacteroides fragilis NOT DETECTED NOT DETECTED Final   Enterobacterales NOT DETECTED NOT DETECTED Final   Enterobacter cloacae complex NOT DETECTED NOT DETECTED Final   Escherichia coli NOT DETECTED NOT DETECTED Final   Klebsiella aerogenes NOT DETECTED NOT DETECTED Final   Klebsiella oxytoca NOT DETECTED NOT DETECTED Final   Klebsiella pneumoniae NOT DETECTED NOT DETECTED Final   Proteus species NOT DETECTED NOT DETECTED Final   Salmonella species NOT DETECTED NOT DETECTED Final   Serratia marcescens NOT DETECTED NOT DETECTED Final   Haemophilus influenzae NOT DETECTED NOT DETECTED Final   Neisseria meningitidis NOT DETECTED NOT DETECTED Final   Pseudomonas aeruginosa NOT DETECTED NOT DETECTED Final   Stenotrophomonas maltophilia NOT DETECTED NOT DETECTED Final   Candida albicans NOT DETECTED NOT DETECTED Final   Candida auris NOT DETECTED NOT DETECTED Final   Candida glabrata NOT DETECTED NOT DETECTED Final   Candida krusei NOT DETECTED NOT DETECTED Final   Candida parapsilosis NOT DETECTED NOT DETECTED Final   Candida tropicalis NOT DETECTED NOT DETECTED Final   Cryptococcus neoformans/gattii NOT DETECTED NOT DETECTED Final    Comment: Performed at [facility], [address], [place], [place] [address]  Blood Culture (routine x 2)     Status: None   Collection Time: 07/24/22 10:49 PM   Specimen: BLOOD LEFT ARM  Result Value Ref Range Status   Specimen Description   Final    BLOOD LEFT ARM Performed at [facility], [address], [place], [place] [address]    Special Requests   Final    BOTTLES DRAWN AEROBIC AND ANAEROBIC Blood Culture results may not be  optimal due to an inadequate volume of blood received in culture bottles Performed at [facility], [address], [place], [place] [address]    Culture   Final    NO GROWTH 5 DAYS Performed at [facility], [address] [place], [place] [address]    Report Status 07/30/2022 FINAL  Final  Culture, blood (Routine X 2) w Reflex to ID Panel     Status: None (Preliminary result)   Collection Time: 07/28/22  7:40 PM   Specimen: BLOOD RIGHT ARM  Result Value Ref Range Status   Specimen Description   Final    BLOOD RIGHT ARM BOTTLES DRAWN AEROBIC AND ANAEROBIC Performed at [facility], [address], [place], [place] [address]    Special Requests   Final    Blood Culture adequate volume Performed at [facility], [address] [place], [place] [address]    Culture   Final    NO GROWTH 3 DAYS Performed at [facility], [address] [place], [place] [address]    Report Status PENDING  Incomplete  Culture, blood (Routine X 2) w Reflex to ID Panel     Status: None (Preliminary result)   Collection Time: 07/28/22  8:00 PM   Specimen: Right Antecubital; Blood  Result Value Ref Range Status   Specimen Description   Final    RIGHT ANTECUBITAL BOTTLES DRAWN AEROBIC AND ANAEROBIC Performed at [facility], [address] [place], [place] [address]    Special Requests   Final    Blood Culture adequate volume Performed at [facility], [address] [place], [place] [address]    Culture   Final    NO GROWTH 3 DAYS Performed at [facility], [address], [place], [place] [address]    Report Status PENDING  Incomplete  Resp panel by RT-PCR (RSV, Flu A&B, Covid) Anterior Nasal Swab     Status: None   Collection Time: 07/29/22  1:18 AM   Specimen: Anterior Nasal Swab  Result Value Ref Range Status   SARS Coronavirus 2 by RT PCR NEGATIVE NEGATIVE Final    Comment:  (NOTE) SARS-CoV-2 target nucleic acids are NOT DETECTED.  The SARS-CoV-2 RNA is generally detectable in upper respiratory specimens during the acute phase of infection. The lowest concentration of SARS-CoV-2 viral copies this assay can detect is 138 copies/mL. A negative result does not preclude SARS-Cov-2 infection and should not be used as the sole basis for treatment or other patient management decisions. A negative result may occur with  improper specimen collection/handling, submission of specimen other than nasopharyngeal swab, presence of viral mutation(s) within the areas targeted by this assay, and inadequate number of viral copies(<138 copies/mL). A negative result must be combined with clinical observations, patient history, and epidemiological information. The expected result is Negative.  Fact Sheet for Patients:  BloggerCourse.com  Fact Sheet for Healthcare Providers:  SeriousBroker.it  This test is no t yet approved or cleared by the [place] FDA and  has been authorized for detection and/or diagnosis of SARS-CoV-2 by FDA under an Emergency Use Authorization (EUA). This EUA will remain  in effect (meaning this test can be used) for the duration of the COVID-19 declaration under Section 564(b)(1) of the Act, 21 U.S.C.section 360bbb-3(b)(1), unless the authorization is terminated  or revoked sooner.       Influenza A by PCR NEGATIVE NEGATIVE Final   Influenza B by PCR NEGATIVE NEGATIVE Final    Comment: (NOTE) The Xpert Xpress SARS-CoV-2/FLU/RSV plus assay is intended as an aid in the diagnosis of influenza from Nasopharyngeal swab specimens and should not be used as a sole basis for treatment. Nasal washings and aspirates are unacceptable for Xpert Xpress SARS-CoV-2/FLU/RSV testing.  Fact Sheet for Patients: BloggerCourse.com  Fact Sheet for Healthcare  Providers: SeriousBroker.it  This test is not yet approved or cleared by the [place] FDA and has been authorized for detection and/or diagnosis of SARS-CoV-2 by FDA under an Emergency Use Authorization (EUA). This EUA will remain in effect (meaning this test can be used) for the duration of the COVID-19 declaration under Section 564(b)(1) of the Act, 21 U.S.C. section 360bbb-3(b)(1), unless the authorization is terminated or revoked.     Resp Syncytial Virus by PCR NEGATIVE NEGATIVE Final    Comment: (NOTE) Fact Sheet for Patients: BloggerCourse.com  Fact Sheet for Healthcare Providers: SeriousBroker.it  This test is not yet approved or cleared by the [place] FDA and has been authorized for detection and/or diagnosis of SARS-CoV-2 by FDA under an Emergency Use Authorization (EUA). This EUA will remain in effect (meaning this test can be used) for the duration of the COVID-19 declaration under Section 564(b)(1) of the Act, 21 U.S.C. section 360bbb-3(b)(1), unless the authorization is terminated or revoked.  Performed at [facility], [address], [place], [place] [address]   C Difficile Quick Screen w PCR reflex  Status: None   Collection Time: 07/29/22  9:44 AM   Specimen: STOOL  Result Value Ref Range Status   C Diff antigen NEGATIVE NEGATIVE Final   C Diff toxin NEGATIVE NEGATIVE Final   C Diff interpretation No C. difficile detected.  Final    Comment: Performed at [facility], [address], [place], [place] [address]    Time coordinating discharge: 45 minutes  Signed: Melina Schools Tekeyah Santiago  [facility] 07/31/2022, 1:27 PM

## 2022-07-31 NOTE — Progress Notes (Signed)
Physical Therapy Treatment Patient Details Name: Allison Jimenez MRN: [id number] DOB: 06-21-1950 Today's Date: 07/31/2022   History of Present Illness Patient is a 72 y.o. female with PMH: COPD, HTN, HLD, depression, anxiety, infrarenal abdominal aortic aneurysm. She presented to [facility] ED on 07/28/2022  with her sister due to increasing lethargy and a fall in AM. Patient was recently admitted 7/23-24, found to have Klebsiella UTI but she adamantly wanted to leave after day two of admission and was sent home with remaining antibiotics.  In ED, she was found to have multiple SIRS criteria including tachycardia, leukocytosis and clinical evidence of dehydration and encephalopathy. She has been confused and agitated at times, requiring wrist restraints.S/P CYSTOSCOPY WITH RETROGRADE PYELOGRAM/URETERAL STENT PLACEMENT (Right: Ureter) 07/28/22    PT Comments  The patient's sister present, plans are to return home with  caregivers. Patient is awake and able to participate in PT. Patient ambulated x 72' with rollator. Patient  is hallucinating cats in the room. Rec. HHPT   And 24/7 for safety.   If plan is discharge home, recommend the following: A lot of help with walking and/or transfers;A lot of help with bathing/dressing/bathroom;Assistance with cooking/housework;Assist for transportation;Help with stairs or ramp for entrance   Can travel by private vehicle        Equipment Recommendations  None recommended by PT    Recommendations for Other Services       Precautions / Restrictions Precautions Precautions: Fall     Mobility  Bed Mobility   Bed Mobility: Supine to Sit     Supine to sit: Min assist     General bed mobility comments: assistance to sit upright    Transfers Overall transfer level: Needs assistance Equipment used: Rollator (4 wheels) Transfers: Sit to/from Stand Sit to Stand: Min assist           General transfer comment: steady assistance to stnad at Rw, cues for  safety    Ambulation/Gait Ambulation/Gait assistance: Min assist Gait Distance (Feet): 80 Feet Assistive device: Rollator (4 wheels) Gait Pattern/deviations: Step-through pattern, Step-to pattern, Drifts right/left Gait velocity: decr     General Gait Details: cues for safety, use of brakes   Stairs             Wheelchair Mobility     Tilt Bed    Modified Rankin (Stroke Patients Only)       Balance Overall balance assessment: Needs assistance Sitting-balance support: Bilateral upper extremity supported, Feet supported Sitting balance-Leahy Scale: Fair     Standing balance support: During functional activity, Bilateral upper extremity supported Standing balance-Leahy Scale: Poor                              Cognition Arousal/Alertness: Awake/alert Behavior During Therapy: WFL for tasks assessed/performed Overall Cognitive Status: Impaired/Different from baseline Area of Impairment: Orientation, Attention, Following commands, Awareness                 Orientation Level: Time, Situation Current Attention Level: Focused, Sustained   Following Commands: Follows one step commands inconsistently       General Comments: patient hallucinating a white cat. able to participate, sister present        Exercises      General Comments        Pertinent Vitals/Pain Pain Assessment Faces Pain Scale: No hurt    Home Living  Prior Function            PT Goals (current goals can now be found in the care plan section) Progress towards PT goals: Progressing toward goals    Frequency    Min 1X/week      PT Plan Discharge plan needs to be updated    Co-evaluation              AM-PAC PT "6 Clicks" Mobility   Outcome Measure  Help needed turning from your back to your side while in a flat bed without using bedrails?: A Little Help needed moving from lying on your back to sitting on the side  of a flat bed without using bedrails?: A Little Help needed moving to and from a bed to a chair (including a wheelchair)?: A Little Help needed standing up from a chair using your arms (e.g., wheelchair or bedside chair)?: A Little Help needed to walk in hospital room?: A Little Help needed climbing 3-5 steps with a railing? : A Lot 6 Click Score: 17    End of Session Equipment Utilized During Treatment: Gait belt Activity Tolerance: Patient tolerated treatment well Patient left: in chair;with chair alarm set;with call bell/phone within reach;with family/visitor present Nurse Communication: Mobility status PT Visit Diagnosis: Other abnormalities of gait and mobility (R26.89)     Time: 1610-9604 PT Time Calculation (min) (ACUTE ONLY): 30 min  Charges:    $Gait Training: 23-37 mins PT General Charges $$ ACUTE PT VISIT: 1 Visit                     Blanchard Kelch PT Acute Rehabilitation Services Office [phone] Weekend [phone]    Rada Hay 07/31/2022, 3:55 PM

## 2022-07-31 NOTE — TOC Transition Note (Signed)
Transition of Care [facility]) - CM/SW Discharge Note   Patient Details  Name: Allison Jimenez MRN: [id number] Date of Birth: May 22, 1950  Transition of Care [facility]) CM/SW Contact:  Howell Rucks, RN Phone Number: 07/31/2022, 1:42 PM   Clinical Narrative:  PT recommendation for short term rehab-SNF, pt and pt's sister [name]) declined, pt to dc home today under the care of her sister with [facility] PT Iantha Fallen). No further TOC needs identified.     Final next level of care: Home w Home Health Services Barriers to Discharge: Barriers Resolved   Patient Goals and CMS Choice CMS Medicare.gov Compare Post Acute Care list provided to:: Patient Choice offered to / list presented to : Patient  Discharge Placement                         Discharge Plan and Services Additional resources added to the After Visit Summary for                            [facility] Arranged: PT [facility] Agency: [facility] Date [facility] Agency Contacted: 07/31/22 Time HH Agency Contacted: 1342 Representative spoke with at [facility] Agency: Amy  Social Determinants of Health (SDOH) Interventions SDOH Screenings   Food Insecurity: No Food Insecurity (07/29/2022)  Housing: Low Risk  (07/29/2022)  Transportation Needs: No Transportation Needs (07/29/2022)  Utilities: Not At Risk (07/29/2022)  Alcohol Screen: Low Risk  (09/16/2020)  Depression (PHQ2-9): Low Risk  (01/31/2022)  Social Connections: Unknown (05/12/2021)   Received from [facility]  Tobacco Use: High Risk (07/28/2022)     Readmission Risk Interventions     No data to display

## 2022-08-01 ENCOUNTER — Encounter: Payer: Self-pay | Admitting: *Deleted

## 2022-08-01 ENCOUNTER — Telehealth: Payer: Self-pay | Admitting: *Deleted

## 2022-08-01 NOTE — Transitions of Care (Post Inpatient/ED Visit) (Signed)
08/01/2022  Name: Allison Jimenez MRN: [id number] DOB: [birth year]  Today's TOC FU Call Status: Today's TOC FU Call Status:: Successful TOC FU Call Completed TOC FU Call Complete Date: 08/01/22  Transition Care Management Follow-up Telephone Call Date of Discharge: 07/31/22 Discharge Facility: [facility]) Type of Discharge: Inpatient Admission Primary Inpatient Discharge Diagnosis:: AMS with mechanical fall at home; kidney stone with uretal stent placement; 30-day readmission How have you been since you were released from the hospital?: Better (per sister/ caregiver: "She seems to be better, I am taking care of everything she needs, I am a retired Charity fundraiser, but this is a lot because she has been through so much lately, I really have my roller skates on with all of this")  Caregiver is actively providing care for patient during [facility] call- assisting with toileting needs, assisting to get in bed etc  Any questions or concerns?: Yes Patient Questions/Concerns::  1) does not have any follow up information for [facility]- verified needs uretal stone extraction with [facility] within 2 weeks;  2) did not receive Rx for probiotic from outpatient pharmacy;  3) no scheduled hospital follow up with PCP  4) has not yet heard from home health agency  5) has on nicotene patch which was placed during hospital visit but continues to smoke now that she is at home  Patient Questions/Concerns Addressed: Other:  1) provided phone number for [facility] and advised sister to call today to get information on scheduling needed procuedure- verified needs to be done within 2 weeks per hospital discharge notes;  2) verified active Rx sent to outpatient pharmacy for probiotic- advised caregiver to follow up with outpatient pharmacy to determine status of Rx: provided education around possibility of using OTC probiotic if needed, also using yogurt as well;  3) sent message to [facility] clinical pool to facilitate  prompt scheduling of HFU- [facility] scheduled their own HFU/ TOC appointments-- advised recommended within 7 days of discharge 4) provided phone number to [place] home health and provided education around 48-hour processing of home health orders- advised caregiver to call tomorrow afternoon if she has not heard from agency to schedule; provided my direct number should she have issues with ensuring receipt of post-hospital discharge referral 5) advised that patient should not be smoking while wearing nicotine patch- advised to remove patch if patient continues to smoke at home post-hospital discharge; advised caregiver to discuss need for additional Rx for smoking cessation IF patient wishes to stop smoking in the future (no current Rx-- caregiver does not think patient will stop smoking); advised she may be able to obtain nicotine patches/ gum, etc as OTC, but should not use if she continues to smoke  Items Reviewed: Did you receive and understand the discharge instructions provided?: Yes (thoroughly reviewed with patient's sister/ caregiver who verbalizes good understanding of same) Medications obtained,verified, and reconciled?: Yes (Medications Reviewed) (Full medication reconciliation/ review completed; confirmed patient obtained/ is taking all newly Rx'd medications as instructed; sister-manages medications and denies questions/ concerns around medications today aside need of pro-biotic; advice provided) Any new allergies since your discharge?: No Dietary orders reviewed?: Yes Type of Diet Ordered:: "Healthy as possible" Do you have support at home?: Yes People in Home: sibling(s) Name of Support/Comfort Primary Source: Reports resides with sister who assists with all care needs; requires assistance/ supervision in self-care activities; supportive sister is retired Charity fundraiser and assists as/ if needed/ indicated  Medications Reviewed Today: Medications Reviewed Today  Reviewed by Michaela Corner, RN  (Registered Nurse) on 08/01/22 at 1344  Med List Status: <None>   Medication Order Taking? Sig Documenting Provider Last Dose Status Informant  acetaminophen (TYLENOL) 500 MG tablet [id number] Yes Take 500-1,000 mg by mouth every 6 (six) hours as needed for mild pain, moderate pain or headache. [provider] Taking Active   acidophilus (RISAQUAD) CAPS capsule [id number] Yes Take 1 capsule by mouth daily for 5 days. Lorin Glass, MD Taking Active            Med Note Otelia Limes Aug 01, 2022  1:17 PM) 08/01/22: reports during [facility] call outpatient pharmacy did not fill this medication- she will call outpatient pharmacy to discuss/ obtain- made aware that she can obtain OTC probiotic- sister will obtain and start giving  albuterol (VENTOLIN HFA) 108 (90 Base) MCG/ACT inhaler [id number] Yes Inhale 2 puffs into the lungs every 6 (six) hours as needed for shortness of breath or wheezing. Omar Person, MD Taking Active   ALPRAZolam Prudy Feeler) 0.25 MG tablet [id number] Yes Take 0.125-0.25 mg by mouth 3 (three) times daily as needed for anxiety. [provider] Taking Active   atorvastatin (LIPITOR) 20 MG tablet [id number] Yes TAKE 1 TABLET(20 MG) BY MOUTH DAILY  Patient taking differently: Take 20 mg by mouth daily.   Cox, Kirsten, MD Taking Active   calcitonin, salmon, (MIACALCIN/FORTICAL) 200 UNIT/ACT nasal spray [id number] Yes Place 1 spray into alternate nostrils daily. [provider] Taking Active            Med Note Michaela Corner   Wed Aug 01, 2022  1:08 PM) 08/01/22: reports during [facility] call she has not been taking consistently- sister reports to resume taking regularly today  Carboxymethylcellul-Glycerin (LUBRICATING EYE DROPS OP) [id number] Yes Place 1 drop into both eyes daily. [provider] Taking Active   carvedilol (COREG) 25 MG tablet [id number] Yes Take 1 tablet (25 mg total) by mouth 2 (two) times daily with a meal. Cox, Kirsten, MD Taking Active    cephALEXin (KEFLEX) 500 MG capsule [id number] Yes Take 1 capsule (500 mg total) by mouth every 8 (eight) hours for 5 days. Lorin Glass, MD Taking Active   cetirizine (ZYRTEC) 10 MG tablet [id number] Yes Take 10 mg by mouth daily as needed for allergies. [provider] Taking Active   conjugated estrogens (PREMARIN) vaginal cream [id number] Yes Place 1 applicator vaginally daily as needed (dryness / burning). [provider] Taking Active   cyclobenzaprine (FLEXERIL) 10 MG tablet [id number] Yes Take 1 tablet (10 mg total) by mouth 3 (three) times daily as needed for muscle spasms. Cox, Kirsten, MD Taking Active   diclofenac Sodium (VOLTAREN) 1 % GEL [id number] Yes Apply 1 Application topically 4 (four) times daily as needed (pain). Glade Lloyd, MD Taking Active   Erenumab-aooe (AIMOVIG) 140 MG/ML Ivory Broad [id number] Yes Inject 140 mg as directed every 30 (thirty) days. [provider] Taking Active   famotidine (PEPCID) 40 MG tablet [id number] Yes TAKE 1 TABLET(40 MG) BY MOUTH AT BEDTIME  Patient taking differently: Take 40 mg by mouth at bedtime.   [facility]ox, Kirsten, MD Taking Active   FLUoxetine (PROZAC) 20 MG capsule [id number] Yes Take 3 capsules (60 mg total) by mouth daily.  Patient taking differently: Take 40 mg by mouth daily.   [facility]ox, Kirsten, MD Taking Active   fluticasone [facility]) 50 MCG/ACT nasal spray [id number] Yes SHAKE LIQUID AND USE 2 SPRAYS IN EACH NOSTRIL DAILY  Patient taking differently: Place 2 sprays into both nostrils daily.   Janie Morning, NP Taking Active            Med Note Michaela Corner   Wed Aug 01, 2022  1:10 PM) 08/01/22: reports during [facility] call uses prn  Fluticasone-Umeclidin-Vilant (TRELEGY ELLIPTA) 100-62.5-25 MCG/ACT AEPB [id number] Yes Inhale 1 puff into the lungs daily. Cox, Kirsten, MD Taking Active   ketoconazole (NIZORAL) 2 % cream [id number] No Apply 1 Application topically daily as needed (cracked skin).  Patient not taking: Reported on  08/01/2022   Glade Lloyd, MD Not Taking Active   linaclotide [facility]) 145 MCG CAPS capsule [id number] Yes Take 1 capsule (145 mcg total) by mouth daily as needed (constipation). Glade Lloyd, MD Taking Active   montelukast (SINGULAIR) 10 MG tablet [id number] Yes TAKE 1 TABLET(10 MG) BY MOUTH AT BEDTIME  Patient taking differently: Take 10 mg by mouth at bedtime.   Cox, Kirsten, MD Taking Active   Multiple Vitamins-Minerals (MULTIVITAMIN WITH IRON-MINERALS) liquid [id number] No Take 15 mLs by mouth daily.  Patient not taking: Reported on 08/01/2022   [provider] Not Taking Active   oxyCODONE-acetaminophen (PERCOCET) 10-325 MG tablet [id number] Yes Take 1 tablet by mouth 4 (four) times daily as needed. [provider] Taking Active   pantoprazole (PROTONIX) 40 MG tablet [id number] Yes TAKE 1 TABLET(40 MG) BY MOUTH DAILY  Patient taking differently: Take 40 mg by mouth daily.   Cox, Kirsten, MD Taking Active   potassium chloride (MICRO-K) 10 MEQ CR capsule [id number] Yes Take 2 capsules (20 mEq total) by mouth 2 (two) times daily. Cox, Kirsten, MD Taking Active   pregabalin (LYRICA) 25 MG capsule [id number] Yes Take 25 mg by mouth 3 (three) times daily. [provider] Taking Active   promethazine (PHENERGAN) 25 MG tablet [id number] Yes TAKE 1 TABLET BY MOUTH EVERY 8 HOURS IF NEEDED  Patient taking differently: Take 25 mg by mouth every 8 (eight) hours as needed for nausea or vomiting.   [facility]ox, Kirsten, MD Taking Active   QUEtiapine (SEROQUEL) 25 MG tablet [id number] Yes Take 1 tablet (25 mg total) by mouth 2 (two) times daily as needed for up to 7 days. Lorin Glass, MD Taking Active   sennosides-docusate sodium (SENOKOT-S) 8.6-50 MG tablet [id number] Yes Take 1 tablet by mouth daily. Cox, Kirsten, MD Taking Active            Med Note Otelia Limes Aug 01, 2022  1:15 PM) 08/01/22: reports during [facility] call has not needed recently  sucralfate (CARAFATE) 1 g tablet  [id number] No Take 1 g by mouth 4 (four) times daily.  Patient not taking: Reported on 08/01/2022   [provider] Not Taking Active            Med Note Otelia Limes Aug 01, 2022  1:16 PM) 08/01/22: reports during [facility] call does not have and is not taking  vitamin B-12 (CYANOCOBALAMIN) 500 MCG tablet [id number] No TAKE 1 TABLET BY MOUTH DAILY AT NOON  Patient not taking: Reported on 08/01/2022   Blane Ohara, MD Not Taking Active            Home Care and Equipment/Supplies: Were Home Health Services Ordered?: Yes Name of Home Health Agency:: [facility]- [phone] Has Agency set up a time to come to your home?: No (has not yet heard from home health health agency: provided phone number to agency and  advised to call them tomorrow afternoon if she has not heard from them by then; provided my direct number if their are issues with referral status) EMR reviewed for Home Health Orders: Orders present/patient has not received call (refer to CM for follow-up) Any new equipment or medical supplies ordered?: No  Functional Questionnaire: Do you need assistance with bathing/showering or dressing?: Yes (sister provides assistance and supervision with all care needs) Do you need assistance with meal preparation?: Yes (sister provides assistance and supervision with all care needs) Do you need assistance with eating?: No Do you have difficulty maintaining continence: Yes (sister provides assistance and supervision with all care needs- getting patient to and from bathroom) Do you need assistance with getting out of bed/getting out of a chair/moving?: Yes (sister provides assistance and supervision with all care needs) Do you have difficulty managing or taking your medications?: Yes (sister manages all aspects of medication administration)  Follow up appointments reviewed: PCP Follow-up appointment confirmed?: No (sent care coordination otreach to [facility] clinical team,  advising of need for HFU OV within 7 days- [facility] schedules their own HFU OV) MD Provider Line Number:[phone] Given: No Specialist Hospital Follow-up appointment confirmed?: No (verified well-established with current PCP) Reason Specialist Follow-Up Not Confirmed: Patient has Specialist Provider Number and will Call for Appointment (provided phone number to [facility] along with talking points to sister to get recommended procedure scheduled (stone extraction) within 2 weeks as instructed at hospital discharge) Do you need transportation to your follow-up appointment?: No Do you understand care options if your condition(s) worsen?: Yes-patient verbalized understanding  SDOH Interventions Today    Flowsheet Row Most Recent Value  SDOH Interventions   Food Insecurity Interventions Intervention Not Indicated  Transportation Interventions Intervention Not Indicated  [sister provides all transportation]      TOC Interventions Today    Flowsheet Row Most Recent Value  TOC Interventions   TOC Interventions Discussed/Reviewed TOC Interventions Discussed, Contacted provider for patient needs  [provided my direct contact information should questions/ concerns/ needs arise post-TOC call, prior to RN CM telephone visit 08/08/22]      Interventions Today    Flowsheet Row Most Recent Value  Chronic Disease   Chronic disease during today's visit Other  [AMS/ fall/ recent UTI,  kidney stone with stent placement]  General Interventions   General Interventions Discussed/Reviewed General Interventions Discussed, Durable Medical Equipment (DME), Communication with, Doctor Visits, Referral to Nurse  [scheduled with RN CM Care Coordinator for follow up telephone visit on 08/08/22]  Doctor Visits Discussed/Reviewed Doctor Visits Discussed, PCP, Specialist  Durable Medical Equipment (DME) Bed side commode, Walker, Shower bench  PCP/Specialist Visits Compliance with follow-up visit  [sent message to  [facility] requesting call to sister to schedule HFU within 7 days, as recommended]  Communication with PCP/Specialists, RN  Education Interventions   Education Provided Provided Education  Provided Verbal Education On Medication, Other  [purpose of probiotics in setting of antibiotic therapy,  talking points to discuss with home health agency and urology provider,  need to remove nicotine patch from patient's skin placed at hospital, if patient continues to smoke at home]  Nutrition Interventions   Nutrition Discussed/Reviewed Nutrition Discussed  Pharmacy Interventions   Pharmacy Dicussed/Reviewed Pharmacy Topics Discussed  Safety Interventions   Safety Discussed/Reviewed Safety Discussed      Caryl Pina, RN, BSN, CCRN Alumnus RN CM Care Coordination/ Transition of Care- [facility] Care Management [phone]: direct office

## 2022-08-02 LAB — CULTURE, BLOOD (ROUTINE X 2)
Culture: NO GROWTH
Culture: NO GROWTH
Special Requests: ADEQUATE
Special Requests: ADEQUATE

## 2022-08-03 ENCOUNTER — Telehealth: Payer: Self-pay

## 2022-08-03 DIAGNOSIS — W19XXXA Unspecified fall, initial encounter: Secondary | ICD-10-CM | POA: Diagnosis not present

## 2022-08-03 DIAGNOSIS — B961 Klebsiella pneumoniae [K. pneumoniae] as the cause of diseases classified elsewhere: Secondary | ICD-10-CM | POA: Diagnosis not present

## 2022-08-03 DIAGNOSIS — J449 Chronic obstructive pulmonary disease, unspecified: Secondary | ICD-10-CM | POA: Diagnosis not present

## 2022-08-03 DIAGNOSIS — F17210 Nicotine dependence, cigarettes, uncomplicated: Secondary | ICD-10-CM | POA: Diagnosis not present

## 2022-08-03 DIAGNOSIS — G9341 Metabolic encephalopathy: Secondary | ICD-10-CM | POA: Diagnosis not present

## 2022-08-03 DIAGNOSIS — J440 Chronic obstructive pulmonary disease with acute lower respiratory infection: Secondary | ICD-10-CM | POA: Diagnosis not present

## 2022-08-03 DIAGNOSIS — N390 Urinary tract infection, site not specified: Secondary | ICD-10-CM | POA: Diagnosis not present

## 2022-08-03 DIAGNOSIS — M6281 Muscle weakness (generalized): Secondary | ICD-10-CM | POA: Diagnosis not present

## 2022-08-03 DIAGNOSIS — J189 Pneumonia, unspecified organism: Secondary | ICD-10-CM | POA: Diagnosis not present

## 2022-08-03 NOTE — Telephone Encounter (Signed)
Called patient sister Kriste Basque) left message for patient or sister to call back to schedule hospital follow up

## 2022-08-03 NOTE — Telephone Encounter (Signed)
-----   Message from Nurse Su Hilt T sent at 08/01/2022  2:06 PM EDT ----- Hi [facility] team,  See note below from [facility] call today-- several concerns; 2 recent hospital admissions  Pls call sister/ caregiver Kriste Basque at [phone] to schedule HFU OV with Dr. Sedalia Muta-- needs HFU "within one week" per discharging provider note  Pls call/ msg if questions,  Caryl Pina, RN, BSN, CCRN Alumnus RN CM Care Coordination/ Transition of Care- [facility] Care Management [phone]: direct office

## 2022-08-06 ENCOUNTER — Telehealth: Payer: Self-pay

## 2022-08-06 ENCOUNTER — Ambulatory Visit: Payer: Self-pay

## 2022-08-06 NOTE — Chronic Care Management (AMB) (Signed)
   08/06/2022  Allison Jimenez December 19, 1950 [id number]   Reason for Encounter: Patient is not currently enrolled in the CCM program. CCM status changed to previously enrolled  Alto Denver RN, MSN, CCM RN Care Manager  [facility]  Ambulatory Care Management  Direct Number: [phone]

## 2022-08-06 NOTE — Telephone Encounter (Signed)
Home Physical Therapy called for approval for plan of care.  Dr. Sedalia Muta approved.

## 2022-08-07 DIAGNOSIS — N390 Urinary tract infection, site not specified: Secondary | ICD-10-CM | POA: Diagnosis not present

## 2022-08-07 DIAGNOSIS — B961 Klebsiella pneumoniae [K. pneumoniae] as the cause of diseases classified elsewhere: Secondary | ICD-10-CM | POA: Diagnosis not present

## 2022-08-07 DIAGNOSIS — J440 Chronic obstructive pulmonary disease with acute lower respiratory infection: Secondary | ICD-10-CM | POA: Diagnosis not present

## 2022-08-07 DIAGNOSIS — W19XXXA Unspecified fall, initial encounter: Secondary | ICD-10-CM | POA: Diagnosis not present

## 2022-08-07 DIAGNOSIS — F17210 Nicotine dependence, cigarettes, uncomplicated: Secondary | ICD-10-CM | POA: Diagnosis not present

## 2022-08-07 DIAGNOSIS — G9341 Metabolic encephalopathy: Secondary | ICD-10-CM | POA: Diagnosis not present

## 2022-08-07 DIAGNOSIS — M6281 Muscle weakness (generalized): Secondary | ICD-10-CM | POA: Diagnosis not present

## 2022-08-07 DIAGNOSIS — J449 Chronic obstructive pulmonary disease, unspecified: Secondary | ICD-10-CM | POA: Diagnosis not present

## 2022-08-07 DIAGNOSIS — J189 Pneumonia, unspecified organism: Secondary | ICD-10-CM | POA: Diagnosis not present

## 2022-08-08 ENCOUNTER — Ambulatory Visit (INDEPENDENT_AMBULATORY_CARE_PROVIDER_SITE_OTHER): Payer: Medicare PPO | Admitting: Family Medicine

## 2022-08-08 ENCOUNTER — Ambulatory Visit: Payer: Self-pay

## 2022-08-08 VITALS — BP 128/68 | HR 96 | Temp 97.2°F | Ht 61.5 in | Wt 100.0 lb

## 2022-08-08 DIAGNOSIS — B9689 Other specified bacterial agents as the cause of diseases classified elsewhere: Secondary | ICD-10-CM | POA: Diagnosis not present

## 2022-08-08 DIAGNOSIS — E039 Hypothyroidism, unspecified: Secondary | ICD-10-CM | POA: Diagnosis not present

## 2022-08-08 DIAGNOSIS — F411 Generalized anxiety disorder: Secondary | ICD-10-CM | POA: Diagnosis not present

## 2022-08-08 DIAGNOSIS — F02B0 Dementia in other diseases classified elsewhere, moderate, without behavioral disturbance, psychotic disturbance, mood disturbance, and anxiety: Secondary | ICD-10-CM

## 2022-08-08 DIAGNOSIS — J189 Pneumonia, unspecified organism: Secondary | ICD-10-CM | POA: Diagnosis not present

## 2022-08-08 DIAGNOSIS — R7301 Impaired fasting glucose: Secondary | ICD-10-CM

## 2022-08-08 DIAGNOSIS — E782 Mixed hyperlipidemia: Secondary | ICD-10-CM | POA: Diagnosis not present

## 2022-08-08 DIAGNOSIS — N390 Urinary tract infection, site not specified: Secondary | ICD-10-CM

## 2022-08-08 DIAGNOSIS — G301 Alzheimer's disease with late onset: Secondary | ICD-10-CM | POA: Diagnosis not present

## 2022-08-08 DIAGNOSIS — E785 Hyperlipidemia, unspecified: Secondary | ICD-10-CM | POA: Diagnosis not present

## 2022-08-08 MED ORDER — LEVOTHYROXINE SODIUM 25 MCG PO TABS: 25.0000 ug | ORAL_TABLET | Freq: Every day | ORAL | 0 refills | Status: DC

## 2022-08-08 MED ORDER — MEMANTINE HCL 5 MG PO TABS: 5.0000 mg | ORAL_TABLET | Freq: Every day | ORAL | 0 refills | Status: DC

## 2022-08-08 NOTE — Patient Instructions (Signed)
Start on Synthroid 25 mcg once daily.  Follow-up in 6 to 8 weeks for recheck.  Started on Namenda 5 mg nightly.

## 2022-08-08 NOTE — Patient Outreach (Signed)
  Care Coordination   08/08/2022 Name: Allison Jimenez MRN: [id number] DOB: [birth year]   Care Coordination Outreach Attempts:  An unsuccessful telephone outreach was attempted for a scheduled appointment today.  Mailbox was full so no message left.   Follow Up Plan:  Additional outreach attempts will be made to offer the patient care coordination information and services.   Encounter Outcome:  No Answer,   Care Coordination Interventions:  No, not indicated    Rowe Pavy, RN, BSN, [facility] [employer] [phone]

## 2022-08-08 NOTE — Progress Notes (Signed)
Subjective:  Patient ID: Allison Jimenez, female    DOB: Apr 10, 1950  Age: 72 y.o. MRN: [id number]  Chief Complaint  Patient presents with   Hospitalization Follow-up    HPI Patient is a 72 year old white female with past medical history of diabetes, hypertension, hyperlipidemia, chronic tobacco use, COPD, anxiety/depression, arthritis, renal stones, infrarenal AAA who was admitted from July 23 through July 24 for Klebsiella UTI that was treated with IV Rocephin and then she was discharged on Keflex due to her insistence on going home.  She then was readmitted on July 27 after she fell while getting out of the bed.  She hit the right side of her head on the dresser sustaining contusion.  Patient after being discharged from the 7/24 had continued nausea, vomiting, abdominal pain, poor oral intake and worsening weakness and lethargy.  Admitting white blood cell count was 15.9.  Potassium was 2.8.  Sodium 131.  Repeat urinalysis after 1 to 2 days of antibiotics was clear.  CT of head and spine showed no acute injuries. Chest x-ray showed no acute findings. Respiratory viral panel was normal.  Blood cultures were sent. CT of abdomen repeated.  Right middle lobe pneumonia.  Showed a 3 mm right UVJ stone.  On CT scan done during first admission this stone was in the upper pole of the kidney and now has moved down to the UVJ junction.  On July 29, 2022 the patient underwent a cystoscopy with retrograde pyelogram and ureteral stent placement. Patient was given doxycycline for pneumonia.  She Was continued on bronchodilators, Mucinex and incentive spirometry counseled to quit smoking. CT of brain did show some cerebral atrophy but otherwise nothing acute.  She was given Seroquel 25 mg twice daily which improved her mental status. Patient has cut down on smoking. 3-7 per day.. Hypothyroidism: TSH is elevated at 8.  Has phenergan.    01/31/2022    2:27 PM 01/25/2022   10:26 AM 01/08/2022    2:23 PM  10/23/2021   10:33 AM 08/16/2021    2:13 PM  Depression screen PHQ 2/9  Decreased Interest 0  0 0 0  Down, Depressed, Hopeless 0 2 0 1 1  PHQ - 2 Score 0 2 0 1 1  Altered sleeping  0  1   Tired, decreased energy  1  1   Change in appetite  0  0   Feeling bad or failure about yourself   0  0   Trouble concentrating  0  0   Moving slowly or fidgety/restless  0  0   Suicidal thoughts  0  0   PHQ-9 Score  3  3   Difficult doing work/chores  Not difficult at all  Not difficult at all         05/22/2022   11:54 AM  Fall Risk   Falls in the past year? 1  Number falls in past yr: 1  Injury with Fall? 0  Risk for fall due to : History of fall(s);Impaired balance/gait;Impaired mobility  Follow up Falls evaluation completed    Patient Care Team: Blane Ohara, MD as PCP - General (Family Medicine) Bevelyn Ngo, NP as Nurse Practitioner (Pulmonary Disease) Quinn Plowman, PA-C as Physician Assistant (General Practice) Misenheimer, Marcial Pacas, MD as Consulting Physician (Gastroenterology) Sinda Du, MD as Consulting Physician (Ophthalmology) Florene Glen (Physician Assistant) Omar Person, MD (Inactive) as Consulting Physician (Pulmonary Disease)   Review of Systems  Constitutional:  Negative for chills,  fatigue and fever.  HENT:  Negative for congestion, ear pain and sore throat.   Respiratory:  Negative for cough and shortness of breath.   Cardiovascular:  Negative for chest pain.  Gastrointestinal:  Positive for nausea. Negative for abdominal pain, constipation, diarrhea and vomiting.  Genitourinary:  Negative for dysuria and urgency.  Musculoskeletal:  Negative for arthralgias and myalgias.  Skin:  Negative for rash.  Neurological:  Negative for dizziness and headaches.  Psychiatric/Behavioral:  Negative for dysphoric mood. The patient is not nervous/anxious.     Current Outpatient Medications on File Prior to Visit  Medication Sig Dispense Refill    acetaminophen (TYLENOL) 500 MG tablet Take 500-1,000 mg by mouth every 6 (six) hours as needed for mild pain, moderate pain or headache.     albuterol (VENTOLIN HFA) 108 (90 Base) MCG/ACT inhaler Inhale 2 puffs into the lungs every 6 (six) hours as needed for shortness of breath or wheezing. 8 g 2   ALPRAZolam (XANAX) 0.25 MG tablet Take 0.125-0.25 mg by mouth 3 (three) times daily as needed for anxiety.     atorvastatin (LIPITOR) 20 MG tablet TAKE 1 TABLET(20 MG) BY MOUTH DAILY (Patient taking differently: Take 20 mg by mouth daily.) 90 tablet 1   calcitonin, salmon, (MIACALCIN/FORTICAL) 200 UNIT/ACT nasal spray Place 1 spray into alternate nostrils daily.     Carboxymethylcellul-Glycerin (LUBRICATING EYE DROPS OP) Place 1 drop into both eyes daily.     carvedilol (COREG) 25 MG tablet Take 1 tablet (25 mg total) by mouth 2 (two) times daily with a meal. 180 tablet 0   cetirizine (ZYRTEC) 10 MG tablet Take 10 mg by mouth daily as needed for allergies.     conjugated estrogens (PREMARIN) vaginal cream Place 1 applicator vaginally daily as needed (dryness / burning).     cyclobenzaprine (FLEXERIL) 10 MG tablet Take 1 tablet (10 mg total) by mouth 3 (three) times daily as needed for muscle spasms. 30 tablet 2   diclofenac Sodium (VOLTAREN) 1 % GEL Apply 1 Application topically 4 (four) times daily as needed (pain).     Erenumab-aooe (AIMOVIG) 140 MG/ML SOAJ Inject 140 mg as directed every 30 (thirty) days.     famotidine (PEPCID) 40 MG tablet TAKE 1 TABLET(40 MG) BY MOUTH AT BEDTIME (Patient taking differently: Take 40 mg by mouth at bedtime.) 90 tablet 3   fluticasone (FLONASE) 50 MCG/ACT nasal spray SHAKE LIQUID AND USE 2 SPRAYS IN EACH NOSTRIL DAILY (Patient taking differently: Place 2 sprays into both nostrils daily.) 16 g 6   Fluticasone-Umeclidin-Vilant (TRELEGY ELLIPTA) 100-62.5-25 MCG/ACT AEPB Inhale 1 puff into the lungs daily. 3 each 3   linaclotide (LINZESS) 145 MCG CAPS capsule Take 1 capsule  (145 mcg total) by mouth daily as needed (constipation).     oxyCODONE-acetaminophen (PERCOCET) 10-325 MG tablet Take 1 tablet by mouth 4 (four) times daily as needed.     pantoprazole (PROTONIX) 40 MG tablet TAKE 1 TABLET(40 MG) BY MOUTH DAILY (Patient taking differently: Take 40 mg by mouth daily.) 90 tablet 0   potassium chloride (MICRO-K) 10 MEQ CR capsule Take 2 capsules (20 mEq total) by mouth 2 (two) times daily. 360 capsule 1   pregabalin (LYRICA) 25 MG capsule Take 25 mg by mouth 3 (three) times daily.     promethazine (PHENERGAN) 25 MG tablet TAKE 1 TABLET BY MOUTH EVERY 8 HOURS IF NEEDED (Patient taking differently: Take 25 mg by mouth every 8 (eight) hours as needed for nausea or vomiting.) 20  tablet 0   QUEtiapine (SEROQUEL) 25 MG tablet Take 1 tablet (25 mg total) by mouth 2 (two) times daily as needed for up to 7 days. 14 tablet 0   sennosides-docusate sodium (SENOKOT-S) 8.6-50 MG tablet Take 1 tablet by mouth daily. 30 tablet 3   No current facility-administered medications on file prior to visit.   Past Medical History:  Diagnosis Date   Acute blood loss anemia (ABLA) 09/05/2020   Allergy    ANXIETY 06/05/2006   Arthritis    SHOULDERS    BACK PAIN 01/05/2010   Cataract    bilateral, left worse   Centrilobular emphysema (HCC)    COPD (chronic obstructive pulmonary disease) (HCC)    Depression    Diabetes mellitus without complication (HCC)    no meds, diet controllled   DIVERTICULOSIS, COLON 06/05/2006   DIZZINESS OR VERTIGO 06/05/2006   positional   Hearing loss in left ear    no hearing aid   History of blood transfusion 08/2020   History of kidney stones    passed stones   HYPERLIPIDEMIA 06/05/2006   Hypertension    HYPOKALEMIA 12/14/2008   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   Qualifier: Diagnosis of  By: Amador Cunas  MD, Janett Labella    Migraine    TOBACCO USER 11/25/2007   Past Surgical History:  Procedure Laterality Date   APPENDECTOMY     BRONCHIAL BIOPSY   09/28/2021   Procedure: BRONCHIAL BIOPSIES;  Surgeon: Omar Person, MD;  Location: [facility] ENDOSCOPY;  Service: Pulmonary;;   BRONCHIAL NEEDLE ASPIRATION BIOPSY  09/28/2021   Procedure: BRONCHIAL NEEDLE ASPIRATION BIOPSIES;  Surgeon: Omar Person, MD;  Location: [facility] ENDOSCOPY;  Service: Pulmonary;;   BRONCHIAL WASHINGS  09/28/2021   Procedure: BRONCHIAL WASHINGS;  Surgeon: Omar Person, MD;  Location: [facility] ENDOSCOPY;  Service: Pulmonary;;   CESAREAN SECTION     x1   CHOLECYSTECTOMY     COLONOSCOPY     COSMETIC SURGERY     CYSTOSCOPY W/ URETERAL STENT PLACEMENT Right 07/29/2022   Procedure: CYSTOSCOPY WITH RETROGRADE PYELOGRAM/URETERAL STENT PLACEMENT;  Surgeon: Malen Gauze, MD;  Location: [facility] ORS;  Service: Urology;  Laterality: Right;   mastoid surgery  1992   shunt in mastoid- endolymphatic sac in left ear    MRI  07/11/2020   x several, last one located in CE   TUBAL LIGATION     UPPER GI ENDOSCOPY     vocal cord surgery     nodule x2    Family History  Problem Relation Age of Onset   Breast cancer Mother    Migraines Other    Depression Other    Anxiety disorder Other    High Cholesterol Other    Colon cancer Neg Hx    Rectal cancer Neg Hx    Stomach cancer Neg Hx    Colon polyps Neg Hx    Esophageal cancer Neg Hx    Social History   Socioeconomic History   Marital status: Divorced    Spouse name: Not on file   Number of children: 1   Years of education: 13   Highest education level: Not on file  Occupational History    Employer: [employer]    Comment: Retired  Tobacco Use   Smoking status: Every Day    Current packs/day: 0.50    Average packs/day: 0.5 packs/day for 49.0 years (24.5 ttl pk-yrs)    Types: Cigarettes   Smokeless tobacco: Never   Tobacco comments:  3/4 ppd 08/30/21 Vernie Murders, CMA  Vaping Use   Vaping status: Former   Start date: 01/01/2010   Quit date: 08/01/2021   Substances: Nicotine   Devices: menthol/fruit  flavor  Substance and Sexual Activity   Alcohol use: No    Alcohol/week: 0.0 standard drinks of alcohol   Drug use: No   Sexual activity: Not Currently    Birth control/protection: Post-menopausal  Other Topics Concern   Not on file  Social History Narrative   Patient lives at home alone. Patient has a Biochemist, clinical. Patient is divorced .   Patient is retired.   Education high school.   Right handed.   Caffeine none.   Social Determinants of Health   Financial Resource Strain: Not on file  Food Insecurity: No Food Insecurity (08/01/2022)   Hunger Vital Sign    Worried About Running Out of Food in the Last Year: Never true    Ran Out of Food in the Last Year: Never true  Transportation Needs: No Transportation Needs (08/01/2022)   PRAPARE - Administrator, Civil Service (Medical): No    Lack of Transportation (Non-Medical): No  Physical Activity: Not on file  Stress: Not on file  Social Connections: Unknown (05/12/2021)   [facility]   Social Network    Social Network: Not on file    Objective:  BP 128/68   Pulse 96   Temp (!) 97.2 F (36.2 C)   Ht 5' 1.5" (1.562 m)   Wt 100 lb (45.4 kg)   SpO2 94%   BMI 18.59 kg/m      08/08/2022    2:53 PM 07/31/2022    5:59 AM 07/30/2022    8:56 PM  BP/Weight  Systolic BP 128 150 172  Diastolic BP 68 99 90  Wt. (Lbs) 100    BMI 18.59 kg/m2      Physical Exam Vitals reviewed.  Constitutional:      Appearance: Normal appearance.     Comments: thin  Neck:     Vascular: No carotid bruit.  Cardiovascular:     Rate and Rhythm: Normal rate and regular rhythm.     Heart sounds: Normal heart sounds.  Pulmonary:     Effort: Pulmonary effort is normal. No respiratory distress.     Breath sounds: Normal breath sounds.  Abdominal:     General: Abdomen is flat. Bowel sounds are normal.     Palpations: Abdomen is soft.     Tenderness: There is no abdominal tenderness.  Skin:    Coloration:  Skin is pale.  Neurological:     Mental Status: She is alert and oriented to person, place, and time.  Psychiatric:        Mood and Affect: Mood normal.        Behavior: Behavior normal.     Diabetic Foot Exam - Simple   No data filed      Lab Results  Component Value Date   WBC 12.0 (H) 08/08/2022   HGB 10.9 (L) 08/08/2022   HCT 34.8 08/08/2022   PLT 556 (H) 08/08/2022   GLUCOSE 104 (H) 08/08/2022   CHOL 98 (L) 08/08/2022   TRIG 203 (H) 08/08/2022   HDL 41 08/08/2022   LDLDIRECT 151.9 09/13/2011   LDLCALC 25 08/08/2022   ALT 15 08/08/2022   AST 25 08/08/2022   NA 138 08/08/2022   K 5.2 08/08/2022   CL 105 08/08/2022   CREATININE 0.58 08/08/2022  BUN 7 (L) 08/08/2022   CO2 20 08/08/2022   TSH 7.922 (H) 07/29/2022   INR 1.1 07/29/2022   HGBA1C 5.6 08/08/2022      Assessment & Plan:    Pneumonia of right middle lobe due to infectious organism Assessment & Plan: The current medical regimen is effective;  continue present plan and medications.  Complete doxycycline.   UTI due to Klebsiella species Assessment & Plan: Complete doxycycline.  Orders: -     Comprehensive metabolic panel -     CBC with Differential/Platelet  Moderate late onset Alzheimer's dementia without behavioral disturbance, psychotic disturbance, mood disturbance, or anxiety (HCC) Assessment & Plan: Started on Namenda 5 mg nightly.  Orders: -     Memantine HCl; Take 1 tablet (5 mg total) by mouth at bedtime.  Dispense: 30 tablet; Refill: 0  Mixed hyperlipidemia Assessment & Plan: Not quite at goal. Continue Atorvastatin 20 mg daily. Recommend start fenofibrate Continue to work on eating a healthy diet and exercise.  Labs drawn today.    Orders: -     Lipid panel  Impaired fasting glucose Assessment & Plan: Recommend continue to work on eating healthy diet and exercise.   Orders: -     Hemoglobin A1c  Hypothyroidism (acquired) Assessment & Plan: Start on Synthroid 25 mcg  once daily.  Follow-up in 6 to 8 weeks for recheck.   Orders: -     Levothyroxine Sodium; Take 1 tablet (25 mcg total) by mouth daily.  Dispense: 90 tablet; Refill: 0  Dyslipidemia Assessment & Plan: Not quite at goal. Continue  Continue to work on eating a healthy diet and exercise.  Labs drawn today.     GAD (generalized anxiety disorder) -     FLUoxetine HCl; Take 2 capsules (40 mg total) by mouth daily.     Meds ordered this encounter  Medications   memantine (NAMENDA) 5 MG tablet    Sig: Take 1 tablet (5 mg total) by mouth at bedtime.    Dispense:  30 tablet    Refill:  0   levothyroxine (SYNTHROID) 25 MCG tablet    Sig: Take 1 tablet (25 mcg total) by mouth daily.    Dispense:  90 tablet    Refill:  0   FLUoxetine (PROZAC) 20 MG capsule    Sig: Take 2 capsules (40 mg total) by mouth daily.    Orders Placed This Encounter  Procedures   Comprehensive metabolic panel   CBC with Differential/Platelet   Hemoglobin A1c   Lipid panel     Follow-up: Return in about 6 weeks (around 09/19/2022) for chronic follow up.   I, ,acting as a Neurosurgeon for Blane Ohara, MD.,have documented all relevant documentation on the behalf of Blane Ohara, MD,as directed by  Blane Ohara, MD while in the presence of Blane Ohara, MD.   Clayborn Bigness I Leal-Borjas,acting as a scribe for Blane Ohara, MD.,have documented all relevant documentation on the behalf of Blane Ohara, MD,as directed by  Blane Ohara, MD while in the presence of Blane Ohara, MD.    An After Visit Summary was printed and given to the patient.  Blane Ohara, MD  Family Practice [phone]

## 2022-08-10 DIAGNOSIS — N390 Urinary tract infection, site not specified: Secondary | ICD-10-CM | POA: Diagnosis not present

## 2022-08-10 DIAGNOSIS — J189 Pneumonia, unspecified organism: Secondary | ICD-10-CM | POA: Diagnosis not present

## 2022-08-10 DIAGNOSIS — M6281 Muscle weakness (generalized): Secondary | ICD-10-CM | POA: Diagnosis not present

## 2022-08-10 DIAGNOSIS — J440 Chronic obstructive pulmonary disease with acute lower respiratory infection: Secondary | ICD-10-CM | POA: Diagnosis not present

## 2022-08-10 DIAGNOSIS — W19XXXA Unspecified fall, initial encounter: Secondary | ICD-10-CM | POA: Diagnosis not present

## 2022-08-10 DIAGNOSIS — J449 Chronic obstructive pulmonary disease, unspecified: Secondary | ICD-10-CM | POA: Diagnosis not present

## 2022-08-10 DIAGNOSIS — B961 Klebsiella pneumoniae [K. pneumoniae] as the cause of diseases classified elsewhere: Secondary | ICD-10-CM | POA: Diagnosis not present

## 2022-08-10 DIAGNOSIS — G9341 Metabolic encephalopathy: Secondary | ICD-10-CM | POA: Diagnosis not present

## 2022-08-10 DIAGNOSIS — F17210 Nicotine dependence, cigarettes, uncomplicated: Secondary | ICD-10-CM | POA: Diagnosis not present

## 2022-08-11 DIAGNOSIS — F02B0 Dementia in other diseases classified elsewhere, moderate, without behavioral disturbance, psychotic disturbance, mood disturbance, and anxiety: Secondary | ICD-10-CM | POA: Insufficient documentation

## 2022-08-11 DIAGNOSIS — R7301 Impaired fasting glucose: Secondary | ICD-10-CM | POA: Insufficient documentation

## 2022-08-11 DIAGNOSIS — J189 Pneumonia, unspecified organism: Secondary | ICD-10-CM | POA: Insufficient documentation

## 2022-08-11 NOTE — Assessment & Plan Note (Signed)
Started on Namenda 5 mg nightly.

## 2022-08-11 NOTE — Assessment & Plan Note (Signed)
The current medical regimen is effective;  continue present plan and medications.  Complete doxycycline.

## 2022-08-11 NOTE — Assessment & Plan Note (Signed)
Well controlled.  No changes to medicines. Atorvastatin 20 mg daily Continue to work on eating a healthy diet and exercise.  Labs drawn today.

## 2022-08-11 NOTE — Assessment & Plan Note (Deleted)
Start on Synthroid 25 mcg once daily.  Follow-up in 6 to 8 weeks for recheck.

## 2022-08-11 NOTE — Assessment & Plan Note (Signed)
Start on Synthroid 25 mcg once daily.  Follow-up in 6 to 8 weeks for recheck.

## 2022-08-11 NOTE — Assessment & Plan Note (Signed)
Recommend continue to work on eating healthy diet and exercise.  

## 2022-08-11 NOTE — Assessment & Plan Note (Signed)
Stable

## 2022-08-12 ENCOUNTER — Encounter: Payer: Self-pay | Admitting: Family Medicine

## 2022-08-12 MED ORDER — FLUOXETINE HCL 20 MG PO CAPS
40.0000 mg | ORAL_CAPSULE | Freq: Every day | ORAL | Status: DC
Start: 2022-08-12 — End: 2022-09-25

## 2022-08-12 NOTE — Assessment & Plan Note (Signed)
Not quite at goal. Continue  Continue to work on eating a healthy diet and exercise.  Labs drawn today.

## 2022-08-13 DIAGNOSIS — J189 Pneumonia, unspecified organism: Secondary | ICD-10-CM | POA: Diagnosis not present

## 2022-08-13 DIAGNOSIS — J440 Chronic obstructive pulmonary disease with acute lower respiratory infection: Secondary | ICD-10-CM | POA: Diagnosis not present

## 2022-08-13 DIAGNOSIS — F17210 Nicotine dependence, cigarettes, uncomplicated: Secondary | ICD-10-CM | POA: Diagnosis not present

## 2022-08-13 DIAGNOSIS — J449 Chronic obstructive pulmonary disease, unspecified: Secondary | ICD-10-CM | POA: Diagnosis not present

## 2022-08-13 DIAGNOSIS — M6281 Muscle weakness (generalized): Secondary | ICD-10-CM | POA: Diagnosis not present

## 2022-08-13 DIAGNOSIS — B961 Klebsiella pneumoniae [K. pneumoniae] as the cause of diseases classified elsewhere: Secondary | ICD-10-CM | POA: Diagnosis not present

## 2022-08-13 DIAGNOSIS — N390 Urinary tract infection, site not specified: Secondary | ICD-10-CM | POA: Diagnosis not present

## 2022-08-13 DIAGNOSIS — G9341 Metabolic encephalopathy: Secondary | ICD-10-CM | POA: Diagnosis not present

## 2022-08-13 DIAGNOSIS — W19XXXA Unspecified fall, initial encounter: Secondary | ICD-10-CM | POA: Diagnosis not present

## 2022-08-14 ENCOUNTER — Telehealth: Payer: Self-pay | Admitting: Family Medicine

## 2022-08-14 NOTE — Telephone Encounter (Signed)
[facility] [phone]

## 2022-08-15 DIAGNOSIS — J449 Chronic obstructive pulmonary disease, unspecified: Secondary | ICD-10-CM | POA: Diagnosis not present

## 2022-08-15 DIAGNOSIS — G9341 Metabolic encephalopathy: Secondary | ICD-10-CM | POA: Diagnosis not present

## 2022-08-15 DIAGNOSIS — J189 Pneumonia, unspecified organism: Secondary | ICD-10-CM | POA: Diagnosis not present

## 2022-08-15 DIAGNOSIS — B961 Klebsiella pneumoniae [K. pneumoniae] as the cause of diseases classified elsewhere: Secondary | ICD-10-CM | POA: Diagnosis not present

## 2022-08-15 DIAGNOSIS — F17200 Nicotine dependence, unspecified, uncomplicated: Secondary | ICD-10-CM

## 2022-08-15 DIAGNOSIS — J440 Chronic obstructive pulmonary disease with acute lower respiratory infection: Secondary | ICD-10-CM | POA: Diagnosis not present

## 2022-08-15 DIAGNOSIS — M6281 Muscle weakness (generalized): Secondary | ICD-10-CM | POA: Diagnosis not present

## 2022-08-15 DIAGNOSIS — W19XXXA Unspecified fall, initial encounter: Secondary | ICD-10-CM | POA: Diagnosis not present

## 2022-08-15 DIAGNOSIS — E876 Hypokalemia: Secondary | ICD-10-CM

## 2022-08-15 DIAGNOSIS — E785 Hyperlipidemia, unspecified: Secondary | ICD-10-CM

## 2022-08-15 DIAGNOSIS — F17210 Nicotine dependence, cigarettes, uncomplicated: Secondary | ICD-10-CM | POA: Diagnosis not present

## 2022-08-15 DIAGNOSIS — N201 Calculus of ureter: Secondary | ICD-10-CM | POA: Diagnosis not present

## 2022-08-15 DIAGNOSIS — N390 Urinary tract infection, site not specified: Secondary | ICD-10-CM | POA: Diagnosis not present

## 2022-08-17 DIAGNOSIS — G9341 Metabolic encephalopathy: Secondary | ICD-10-CM | POA: Diagnosis not present

## 2022-08-17 DIAGNOSIS — J440 Chronic obstructive pulmonary disease with acute lower respiratory infection: Secondary | ICD-10-CM | POA: Diagnosis not present

## 2022-08-17 DIAGNOSIS — N390 Urinary tract infection, site not specified: Secondary | ICD-10-CM | POA: Diagnosis not present

## 2022-08-17 DIAGNOSIS — J189 Pneumonia, unspecified organism: Secondary | ICD-10-CM | POA: Diagnosis not present

## 2022-08-17 DIAGNOSIS — J449 Chronic obstructive pulmonary disease, unspecified: Secondary | ICD-10-CM | POA: Diagnosis not present

## 2022-08-17 DIAGNOSIS — B961 Klebsiella pneumoniae [K. pneumoniae] as the cause of diseases classified elsewhere: Secondary | ICD-10-CM | POA: Diagnosis not present

## 2022-08-17 DIAGNOSIS — W19XXXA Unspecified fall, initial encounter: Secondary | ICD-10-CM | POA: Diagnosis not present

## 2022-08-17 DIAGNOSIS — F17210 Nicotine dependence, cigarettes, uncomplicated: Secondary | ICD-10-CM | POA: Diagnosis not present

## 2022-08-17 DIAGNOSIS — M6281 Muscle weakness (generalized): Secondary | ICD-10-CM | POA: Diagnosis not present

## 2022-08-20 ENCOUNTER — Ambulatory Visit: Payer: Medicare PPO | Admitting: Urology

## 2022-08-20 ENCOUNTER — Telehealth: Payer: Self-pay

## 2022-08-20 NOTE — Telephone Encounter (Signed)
Allison Jimenez called back and she is willing to see hematology.  The referral was put in.  She also wanted Dr. Sedalia Muta to know that she has been having swelling of her feet since Saturday and she restarted her chlorthalidone 25 mg that was stopped when she was at [facility]

## 2022-08-20 NOTE — Telephone Encounter (Signed)
Agree with plan. Patient was already contacted, I believe, by carolyn. Dr. Sedalia Muta

## 2022-08-20 NOTE — Telephone Encounter (Signed)
Allison Jimenez called and left a message but the message was garbled and unclear.  I called her back and left a message for her to call us back.

## 2022-08-21 DIAGNOSIS — G9341 Metabolic encephalopathy: Secondary | ICD-10-CM | POA: Diagnosis not present

## 2022-08-21 DIAGNOSIS — W19XXXA Unspecified fall, initial encounter: Secondary | ICD-10-CM | POA: Diagnosis not present

## 2022-08-21 DIAGNOSIS — J189 Pneumonia, unspecified organism: Secondary | ICD-10-CM | POA: Diagnosis not present

## 2022-08-21 DIAGNOSIS — J449 Chronic obstructive pulmonary disease, unspecified: Secondary | ICD-10-CM | POA: Diagnosis not present

## 2022-08-21 DIAGNOSIS — B961 Klebsiella pneumoniae [K. pneumoniae] as the cause of diseases classified elsewhere: Secondary | ICD-10-CM | POA: Diagnosis not present

## 2022-08-21 DIAGNOSIS — M6281 Muscle weakness (generalized): Secondary | ICD-10-CM | POA: Diagnosis not present

## 2022-08-21 DIAGNOSIS — F17210 Nicotine dependence, cigarettes, uncomplicated: Secondary | ICD-10-CM | POA: Diagnosis not present

## 2022-08-21 DIAGNOSIS — N390 Urinary tract infection, site not specified: Secondary | ICD-10-CM | POA: Diagnosis not present

## 2022-08-21 DIAGNOSIS — J440 Chronic obstructive pulmonary disease with acute lower respiratory infection: Secondary | ICD-10-CM | POA: Diagnosis not present

## 2022-08-21 NOTE — Telephone Encounter (Signed)
The patient called back to f.up on her phone call she had with Allison Jimenez. Patient notified that Dr. Sedalia Muta agreed with the plan.

## 2022-08-22 ENCOUNTER — Other Ambulatory Visit: Payer: Self-pay | Admitting: Urology

## 2022-08-23 ENCOUNTER — Telehealth: Payer: Self-pay

## 2022-08-23 ENCOUNTER — Ambulatory Visit: Payer: Self-pay

## 2022-08-23 DIAGNOSIS — R634 Abnormal weight loss: Secondary | ICD-10-CM | POA: Diagnosis not present

## 2022-08-23 DIAGNOSIS — R112 Nausea with vomiting, unspecified: Secondary | ICD-10-CM | POA: Diagnosis not present

## 2022-08-23 DIAGNOSIS — R1310 Dysphagia, unspecified: Secondary | ICD-10-CM | POA: Diagnosis not present

## 2022-08-23 DIAGNOSIS — J449 Chronic obstructive pulmonary disease, unspecified: Secondary | ICD-10-CM | POA: Diagnosis not present

## 2022-08-23 DIAGNOSIS — K5900 Constipation, unspecified: Secondary | ICD-10-CM | POA: Diagnosis not present

## 2022-08-23 DIAGNOSIS — R109 Unspecified abdominal pain: Secondary | ICD-10-CM | POA: Diagnosis not present

## 2022-08-23 DIAGNOSIS — K219 Gastro-esophageal reflux disease without esophagitis: Secondary | ICD-10-CM | POA: Diagnosis not present

## 2022-08-23 NOTE — Patient Outreach (Signed)
  Care Coordination   08/23/2022 Name: Allison Jimenez MRN: [id number] DOB: 1950/06/28   Care Coordination Outreach Attempts:  An unsuccessful telephone outreach was attempted for a scheduled appointment today.  Follow Up Plan:  Additional outreach attempts will be made to offer the patient care coordination information and services.   Encounter Outcome:  No Answer   Care Coordination Interventions:  No, not indicated    Rowe Pavy, RN, BSN, [facility] [employer] [phone]

## 2022-08-23 NOTE — Telephone Encounter (Signed)
[facility]-request for surgical clearance

## 2022-08-24 ENCOUNTER — Ambulatory Visit: Payer: Medicare PPO

## 2022-08-24 VITALS — BP 94/58 | HR 89 | Temp 98.6°F | Ht 61.5 in | Wt 98.6 lb

## 2022-08-24 DIAGNOSIS — B961 Klebsiella pneumoniae [K. pneumoniae] as the cause of diseases classified elsewhere: Secondary | ICD-10-CM | POA: Diagnosis not present

## 2022-08-24 DIAGNOSIS — J440 Chronic obstructive pulmonary disease with acute lower respiratory infection: Secondary | ICD-10-CM | POA: Diagnosis not present

## 2022-08-24 DIAGNOSIS — W19XXXA Unspecified fall, initial encounter: Secondary | ICD-10-CM | POA: Diagnosis not present

## 2022-08-24 DIAGNOSIS — R609 Edema, unspecified: Secondary | ICD-10-CM | POA: Diagnosis not present

## 2022-08-24 DIAGNOSIS — I959 Hypotension, unspecified: Secondary | ICD-10-CM

## 2022-08-24 DIAGNOSIS — N390 Urinary tract infection, site not specified: Secondary | ICD-10-CM | POA: Diagnosis not present

## 2022-08-24 DIAGNOSIS — J449 Chronic obstructive pulmonary disease, unspecified: Secondary | ICD-10-CM | POA: Diagnosis not present

## 2022-08-24 DIAGNOSIS — J189 Pneumonia, unspecified organism: Secondary | ICD-10-CM | POA: Diagnosis not present

## 2022-08-24 DIAGNOSIS — G9341 Metabolic encephalopathy: Secondary | ICD-10-CM | POA: Diagnosis not present

## 2022-08-24 DIAGNOSIS — F17210 Nicotine dependence, cigarettes, uncomplicated: Secondary | ICD-10-CM | POA: Diagnosis not present

## 2022-08-24 DIAGNOSIS — M6281 Muscle weakness (generalized): Secondary | ICD-10-CM | POA: Diagnosis not present

## 2022-08-24 NOTE — Assessment & Plan Note (Signed)
Her bilateral foot and ankle swellings mainly appear to be dependent edema. No known heart failure or any symptoms of it at this time Not consistent with PE as per history and exam No signs of infection even though she has bruises and healing wounds from her prior falls.  PLAN: recommend compression stockings to use during the daytime Elevate legs when resting Continue chlorthalidone at 25 mg daily  Report back if any worsening symptoms or call 911 for any severe symptoms

## 2022-08-24 NOTE — Progress Notes (Signed)
Acute Office Visit  Subjective:    Patient ID: Allison Jimenez, female    DOB: 04-19-1950, 72 y.o.   MRN: [id number]  Chief Complaint  Patient presents with   Feet still Swelling    HPI Allison Jimenez is here with her sister [name]. Allison Jimenez is the primary historian.  Patient is in today for bilateral swelling of feet. Patient called about a week ago with leg swelling and was told to start chlorthalidone which patient has been taking, she states that it has not got any better and states now they are cold feeling.  States she has lost 20 pounds through summer. Reports nausea. Had a uti for which she was hospitalized and then also ws noted to have pneumonia.  TODAY, HER MAIN CONCERN IS EDEMA IN HER LEGS.  Swellings do improve when she lays down at night. Worse in the mornings when she is up and about.  Started taking chlorthalidone last Saturday again.     Past Medical History:  Diagnosis Date   Acute blood loss anemia (ABLA) 09/05/2020   Allergy    ANXIETY 06/05/2006   Arthritis    SHOULDERS    BACK PAIN 01/05/2010   Cataract    bilateral, left worse   Centrilobular emphysema (HCC)    COPD (chronic obstructive pulmonary disease) (HCC)    Depression    Diabetes mellitus without complication (HCC)    no meds, diet controllled   DIVERTICULOSIS, COLON 06/05/2006   DIZZINESS OR VERTIGO 06/05/2006   positional   Hearing loss in left ear    no hearing aid   History of blood transfusion 08/2020   History of kidney stones    passed stones   HYPERLIPIDEMIA 06/05/2006   Hypertension    HYPOKALEMIA 12/14/2008   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   Qualifier: Diagnosis of  By: Amador Cunas  MD, Janett Labella    Migraine    TOBACCO USER 11/25/2007    Past Surgical History:  Procedure Laterality Date   APPENDECTOMY     BRONCHIAL BIOPSY  09/28/2021   Procedure: BRONCHIAL BIOPSIES;  Surgeon: Omar Person, MD;  Location: [facility] ENDOSCOPY;  Service: Pulmonary;;   BRONCHIAL NEEDLE ASPIRATION BIOPSY   09/28/2021   Procedure: BRONCHIAL NEEDLE ASPIRATION BIOPSIES;  Surgeon: Omar Person, MD;  Location: [facility] ENDOSCOPY;  Service: Pulmonary;;   BRONCHIAL WASHINGS  09/28/2021   Procedure: BRONCHIAL WASHINGS;  Surgeon: Omar Person, MD;  Location: [facility] ENDOSCOPY;  Service: Pulmonary;;   CESAREAN SECTION     x1   CHOLECYSTECTOMY     COLONOSCOPY     COSMETIC SURGERY     CYSTOSCOPY W/ URETERAL STENT PLACEMENT Right 07/29/2022   Procedure: CYSTOSCOPY WITH RETROGRADE PYELOGRAM/URETERAL STENT PLACEMENT;  Surgeon: Malen Gauze, MD;  Location: [facility] ORS;  Service: Urology;  Laterality: Right;   mastoid surgery  1992   shunt in mastoid- endolymphatic sac in left ear    MRI  07/11/2020   x several, last one located in CE   TUBAL LIGATION     UPPER GI ENDOSCOPY     vocal cord surgery     nodule x2    Family History  Problem Relation Age of Onset   Breast cancer Mother    Migraines Other    Depression Other    Anxiety disorder Other    High Cholesterol Other    Colon cancer Neg Hx    Rectal cancer Neg Hx    Stomach cancer Neg Hx    Colon polyps Neg  Hx    Esophageal cancer Neg Hx     Social History   Socioeconomic History   Marital status: Divorced    Spouse name: Not on file   Number of children: 1   Years of education: 13   Highest education level: Not on file  Occupational History    Employer: [employer]    Comment: Retired  Tobacco Use   Smoking status: Every Day    Current packs/day: 0.50    Average packs/day: 0.5 packs/day for 49.0 years (24.5 ttl pk-yrs)    Types: Cigarettes   Smokeless tobacco: Never   Tobacco comments:    3/4 ppd 08/30/21 Vernie Murders, CMA  Vaping Use   Vaping status: Former   Start date: 01/01/2010   Quit date: 08/01/2021   Substances: Nicotine   Devices: menthol/fruit flavor  Substance and Sexual Activity   Alcohol use: No    Alcohol/week: 0.0 standard drinks of alcohol   Drug use: No   Sexual activity: Not Currently     Birth control/protection: Post-menopausal  Other Topics Concern   Not on file  Social History Narrative   Patient lives at home alone. Patient has a Biochemist, clinical. Patient is divorced .   Patient is retired.   Education high school.   Right handed.   Caffeine none.   Social Determinants of Health   Financial Resource Strain: Not on file  Food Insecurity: No Food Insecurity (08/01/2022)   Hunger Vital Sign    Worried About Running Out of Food in the Last Year: Never true    Ran Out of Food in the Last Year: Never true  Transportation Needs: No Transportation Needs (08/01/2022)   PRAPARE - Administrator, Civil Service (Medical): No    Lack of Transportation (Non-Medical): No  Physical Activity: Not on file  Stress: Not on file  Social Connections: Unknown (05/12/2021)   Received from [facility]   Social Network    Social Network: Not on file  Intimate Partner Violence: Not At Risk (07/29/2022)   Humiliation, Afraid, Rape, and Kick questionnaire    Fear of Current or Ex-Partner: No    Emotionally Abused: No    Physically Abused: No    Sexually Abused: No    Outpatient Medications Prior to Visit  Medication Sig Dispense Refill   acetaminophen (TYLENOL) 500 MG tablet Take 500-1,000 mg by mouth every 6 (six) hours as needed for mild pain, moderate pain or headache.     albuterol (VENTOLIN HFA) 108 (90 Base) MCG/ACT inhaler Inhale 2 puffs into the lungs every 6 (six) hours as needed for shortness of breath or wheezing. 8 g 2   ALPRAZolam (XANAX) 0.25 MG tablet Take 0.125-0.25 mg by mouth 3 (three) times daily as needed for anxiety.     atorvastatin (LIPITOR) 20 MG tablet TAKE 1 TABLET(20 MG) BY MOUTH DAILY 90 tablet 1   calcitonin, salmon, (MIACALCIN/FORTICAL) 200 UNIT/ACT nasal spray Place 1 spray into alternate nostrils daily.     Carboxymethylcellul-Glycerin (LUBRICATING EYE DROPS OP) Place 1 drop into both eyes daily.     carvedilol (COREG) 25 MG tablet  Take 1 tablet (25 mg total) by mouth 2 (two) times daily with a meal. 180 tablet 0   cetirizine (ZYRTEC) 10 MG tablet Take 10 mg by mouth daily as needed for allergies.     chlorthalidone (HYGROTON) 25 MG tablet Take 25 mg by mouth daily.     Cholecalciferol (VITAMIN D) 50 MCG (2000  UT) CAPS Take 2,000 Units by mouth daily.     conjugated estrogens (PREMARIN) vaginal cream Place 1 applicator vaginally daily as needed (dryness / burning).     cyclobenzaprine (FLEXERIL) 10 MG tablet Take 1 tablet (10 mg total) by mouth 3 (three) times daily as needed for muscle spasms. 30 tablet 2   diclofenac Sodium (VOLTAREN) 1 % GEL Apply 1 Application topically 4 (four) times daily as needed (pain).     Erenumab-aooe (AIMOVIG) 140 MG/ML SOAJ Inject 140 mg as directed every 30 (thirty) days.     famotidine (PEPCID) 40 MG tablet TAKE 1 TABLET(40 MG) BY MOUTH AT BEDTIME (Patient taking differently: Take 40 mg by mouth at bedtime.) 90 tablet 3   FLUoxetine (PROZAC) 20 MG capsule Take 2 capsules (40 mg total) by mouth daily.     fluticasone (FLONASE) 50 MCG/ACT nasal spray SHAKE LIQUID AND USE 2 SPRAYS IN EACH NOSTRIL DAILY (Patient taking differently: Place 2 sprays into both nostrils daily as needed for allergies.) 16 g 6   Fluticasone-Umeclidin-Vilant (TRELEGY ELLIPTA) 100-62.5-25 MCG/ACT AEPB Inhale 1 puff into the lungs daily. 3 each 3   levothyroxine (SYNTHROID) 25 MCG tablet Take 1 tablet (25 mcg total) by mouth daily. 90 tablet 0   linaclotide (LINZESS) 145 MCG CAPS capsule Take 1 capsule (145 mcg total) by mouth daily as needed (constipation).     memantine (NAMENDA) 5 MG tablet Take 1 tablet (5 mg total) by mouth at bedtime. 30 tablet 0   montelukast (SINGULAIR) 10 MG tablet Take 10 mg by mouth at bedtime.     OVER THE COUNTER MEDICATION Take 1 tablet by mouth in the morning. Salt Supplement     oxyCODONE-acetaminophen (PERCOCET) 10-325 MG tablet Take 1 tablet by mouth 4 (four) times daily as needed for pain.      pantoprazole (PROTONIX) 40 MG tablet TAKE 1 TABLET(40 MG) BY MOUTH DAILY 90 tablet 0   potassium chloride (MICRO-K) 10 MEQ CR capsule Take 2 capsules (20 mEq total) by mouth 2 (two) times daily. 360 capsule 1   pregabalin (LYRICA) 25 MG capsule Take 25 mg by mouth 3 (three) times daily.     pregabalin (LYRICA) 50 MG capsule Take 50 mg by mouth 2 (two) times daily as needed (anxiety).     promethazine (PHENERGAN) 25 MG tablet TAKE 1 TABLET BY MOUTH EVERY 8 HOURS IF NEEDED 20 tablet 0   sennosides-docusate sodium (SENOKOT-S) 8.6-50 MG tablet Take 1 tablet by mouth daily. 30 tablet 3   vitamin B-12 (CYANOCOBALAMIN) 500 MCG tablet Take 500 mcg by mouth daily at 12 noon.     QUEtiapine (SEROQUEL) 25 MG tablet Take 1 tablet (25 mg total) by mouth 2 (two) times daily as needed for up to 7 days. (Patient not taking: Reported on 08/23/2022) 14 tablet 0   No facility-administered medications prior to visit.    Allergies  Allergen Reactions   Erythromycin Base Diarrhea   Vfend [Voriconazole] Other (See Comments)    Visual changes   Penicillins Rash    Review of Systems  Constitutional:  Negative for appetite change, fatigue and fever.  HENT:  Negative for congestion, ear pain, sinus pressure and sore throat.   Respiratory:  Negative for cough, chest tightness, shortness of breath and wheezing.   Cardiovascular:  Positive for leg swelling (Bilateral feet). Negative for chest pain and palpitations.  Gastrointestinal:  Negative for abdominal pain, constipation, diarrhea, nausea and vomiting.  Genitourinary:  Negative for dysuria and hematuria.  Musculoskeletal:  Negative for arthralgias, back pain, joint swelling and myalgias.  Skin:  Negative for rash.  Neurological:  Negative for dizziness, weakness and headaches.  Psychiatric/Behavioral:  Negative for dysphoric mood. The patient is not nervous/anxious.        Objective:    Physical Exam Vitals and nursing note reviewed.   Constitutional:      Comments: Frail appearing petite patient  HENT:     Head: Normocephalic and atraumatic.     Mouth/Throat:     Comments: Poor dentition Cardiovascular:     Rate and Rhythm: Normal rate and regular rhythm.  Pulmonary:     Effort: Pulmonary effort is normal.     Breath sounds: Normal breath sounds.  Abdominal:     Tenderness: There is no abdominal tenderness.  Musculoskeletal:     Cervical back: Normal range of motion.     Comments: Foot and ankle edema bilaterally 1+ on feet, 2+ at ankles  Skin:    Findings: Bruising present.     Comments: Superficial wounds noted on lower extremities  Neurological:     General: No focal deficit present.     Mental Status: She is alert and oriented to person, place, and time.     BP (!) 94/58 (BP Location: Left Arm, Patient Position: Sitting)   Pulse 89   Temp 98.6 F (37 C) (Temporal)   Ht 5' 1.5" (1.562 m)   Wt 98 lb 9.6 oz (44.7 kg)   SpO2 99%   BMI 18.33 kg/m  Wt Readings from Last 3 Encounters:  08/24/22 98 lb 9.6 oz (44.7 kg)  08/08/22 100 lb (45.4 kg)  07/28/22 103 lb 9.9 oz (47 kg)    Health Maintenance Due  Topic Date Due   FOOT EXAM  Never done   Zoster Vaccines- Shingrix (2 of 2) 06/22/2016   DTaP/Tdap/Td (2 - Td or Tdap) 05/17/2020   COVID-19 Vaccine (6 - 2023-24 season) 12/18/2021   Medicare Annual Wellness (AWV)  08/17/2022   INFLUENZA VACCINE  08/02/2022    There are no preventive care reminders to display for this patient.   Lab Results  Component Value Date   TSH 7.922 (H) 07/29/2022   Lab Results  Component Value Date   WBC 12.0 (H) 08/08/2022   HGB 10.9 (L) 08/08/2022   HCT 34.8 08/08/2022   MCV 90 08/08/2022   PLT 556 (H) 08/08/2022   Lab Results  Component Value Date   NA 138 08/08/2022   K 5.2 08/08/2022   CO2 20 08/08/2022   GLUCOSE 104 (H) 08/08/2022   BUN 7 (L) 08/08/2022   CREATININE 0.58 08/08/2022   BILITOT 0.3 08/08/2022   ALKPHOS 209 (H) 08/08/2022   AST 25  08/08/2022   ALT 15 08/08/2022   PROT 5.9 (L) 08/08/2022   ALBUMIN 3.1 (L) 08/08/2022   CALCIUM 8.9 08/08/2022   ANIONGAP 13 07/31/2022   EGFR 97 08/08/2022   GFR 75.48 07/09/2016   Lab Results  Component Value Date   CHOL 98 (L) 08/08/2022   Lab Results  Component Value Date   HDL 41 08/08/2022   Lab Results  Component Value Date   LDLCALC 25 08/08/2022   Lab Results  Component Value Date   TRIG 203 (H) 08/08/2022   Lab Results  Component Value Date   CHOLHDL 2.4 08/08/2022   Lab Results  Component Value Date   HGBA1C 5.6 08/08/2022         Assessment & Plan:  Dependent edema Assessment &  Plan: Her bilateral foot and ankle swellings mainly appear to be dependent edema. No known heart failure or any symptoms of it at this time Not consistent with PE as per history and exam No signs of infection even though she has bruises and healing wounds from her prior falls.  PLAN: recommend compression stockings to use during the daytime Elevate legs when resting Continue chlorthalidone at 25 mg daily  Report back if any worsening symptoms or call 911 for any severe symptoms   Hypotension, unspecified hypotension type Assessment & Plan: Possibly multifactorial. Denies any dizziness or recent falls.  PLAN; monitor home Bps Continue coreg 25 mg TWICE DAILY, chlorthalidone 25 mg daily Stay hydrated If Bps continue to stay low, will need to cut the dose of coreg to 12.5 mg BID     Total time spent on today's visit was greater than 30 minutes, including both face-to-face time and nonface-to-face time personally spent on review of chart (labs and imaging), discussing labs and goals, discussing further work-up, treatment options, referrals to specialist if needed, reviewing outside records of pertinent, answering patient's questions, and coordinating care.   I,Lauren M Auman,acting as a Neurosurgeon for [employer], MD.,have documented all relevant documentation on the  behalf of Windell Moment, MD,as directed by  Windell Moment, MD while in the presence of Windell Moment, MD.   Windell Moment, MD

## 2022-08-24 NOTE — Assessment & Plan Note (Addendum)
Possibly multifactorial, from her weight loss, medications, postural Denies any dizziness or recent falls.  PLAN; monitor home Bps Continue coreg 25 mg TWICE DAILY, chlorthalidone 25 mg daily Stay hydrated If Bps continue to stay low, will need to cut the dose of coreg to 12.5 mg BID

## 2022-08-24 NOTE — Patient Instructions (Addendum)
The swellings in her feet and ankle appear to be dependent swellings. I would not increase the dose of your blood pressure medicine chlorthalidone as it could lower your blood pressure and put you at risk of dizziness and falls. Please use compression stockings during the daytime and elevate your legs when resting and at nights. If you have worsening leg swelling with or without any cough or shortness of breath, report back to [place]. Try to cut back on the number of cigarettes and quit ultimately. Follow-up with [place] as needed

## 2022-08-25 NOTE — Progress Notes (Signed)
The patient was identified using 2 approved identifiers. All issues noted in this document were discussed and addressed, Ms. Allison Jimenez and her sister, Allison Jimenez voiced understanding and agreement with all preoperative instructions.  The patient was emailed the surgery instructions per his / her request.  Email: [email or web address]     The patient was instructed to call our Admitting Office [phone] or [phone]) to complete their Pre-surgical Interview.  The patient talked to our Pharmacy Tech  on 08-23-2022 and did the medication reconciliation.   COVID Vaccine received:  []  No [x]  Yes Date of any COVID positive Test in last 90 days:    PCP - Blane Ohara, MD  Cardiologist - none Pulmonology- Felisa Bonier, MD  (LOV 05-23-22)  Chest x-ray - 07-28-2022  1v  Epic EKG -  07-28-2022  Epic Stress Test -  ECHO - 11-25-2019  Epic Cardiac Cath -   PCR screen: []  Ordered & Completed           []   No Order but Needs PROFEND           [x]   N/A for this surgery  Surgery Plan:  [x]  Ambulatory   []  Outpatient in bed  []  Admit  Anesthesia:    [x]  General  []  Spinal  []   Choice []   MAC  Bowel Prep - [x]  No  []   Yes ______  Pacemaker / ICD device [x]  No []  Yes   Spinal Cord Stimulator:[x]  No []  Yes       History of Sleep Apnea? [x]  No []  Yes   CPAP used?- [x]  No []  Yes    Does the patient monitor blood sugar?  [x]  No []  Yes  []  N/A  Patient has: []  NO Hx DM   [x]  Pre-DM   []  DM1  []   DM2 Last A1c was: 5.6   on   08-08-2022   Blood Thinner / Instructions:  none Aspirin Instructions:  none  ERAS Protocol Ordered: [x]  No  []  Yes Patient is to be NPO after: midnight prior  Comments: s/p vocal cord nodules removed, Hoarseness  S/p Mastoid surgery- endolymphatic stent placed- HOH in left ear, no HAs  Patient was lucid and appropriate with her answers to all my PST questions; she has Alzheimer's.  Her Sister, Allison Jimenez was present during this Phone PST visit and did not  correct any of her answers. She has been taking all  her medications as directed with no lapses.   Activity level: Patient is unable to climb a flight of stairs without difficulty; [x]  No CP  but would have SOB  Patient can perform ADLs without assistance.   Anesthesia review: smoker, COPD, GERD, GAD, MDD, THN, Alzheimer's, Pre-DM (diet), infrarenal AAA.  Murmur (ECHO 11-25-2019)  Patient denies shortness of breath, fever, cough and chest pain at PAT appointment.  Patient verbalized understanding and agreement to the Pre-Surgical Instructions that were given to them at this PAT appointment. Patient was also educated of the need to review these PAT instructions again prior to her surgery.I reviewed the appropriate phone numbers to call if they have any and questions or concerns.

## 2022-08-25 NOTE — Patient Instructions (Signed)
SURGICAL WAITING ROOM VISITATION Patients having surgery or a procedure may have no more than 2 support people in the waiting area - these visitors may rotate in the visitor waiting room.   Due to an increase in RSV and influenza rates and associated hospitalizations, children ages 33 and under may not visit patients in [facility] hospitals. If the patient needs to stay at the hospital during part of their recovery, the visitor guidelines for inpatient rooms apply.  PRE-OP VISITATION  Pre-op nurse will coordinate an appropriate time for 1 support person to accompany the patient in pre-op.  This support person may not rotate.  This visitor will be contacted when the time is appropriate for the visitor to come back in the pre-op area.  Please refer to the [facility] website for the visitor guidelines for Inpatients (after your surgery is over and you are in a regular room).  You are not required to quarantine at this time prior to your surgery. However, you must do this: Hand Hygiene often Do NOT share personal items Notify your provider if you are in close contact with someone who has COVID or you develop fever 100.4 or greater, new onset of sneezing, cough, sore throat, shortness of breath or body aches.  If you test positive for Covid or have been in contact with anyone that has tested positive in the last 10 days please notify you surgeon.    Your procedure is scheduled on:  Wednesday  September 05, 2022  Report to [facility] Main Entrance: [address] entrance where the [employer] is available.   Report to admitting at: 06:15    AM  Call this number if you have any questions or problems the morning of surgery [phone]  DO NOT EAT OR DRINK ANYTHING AFTER MIDNIGHT THE NIGHT PRIOR TO YOUR SURGERY / PROCEDURE.   FOLLOW BOWEL PREP AND ANY ADDITIONAL PRE OP INSTRUCTIONS YOU RECEIVED FROM YOUR SURGEON'S OFFICE!!!   Oral Hygiene is also important to reduce your risk of  infection.        Remember - BRUSH YOUR TEETH THE MORNING OF SURGERY WITH YOUR REGULAR TOOTHPASTE  Do NOT smoke after Midnight the night before surgery.  STOP TAKING all Vitamins, Herbs and supplements 1 week before your surgery.   Take ONLY these medicines the morning of surgery with A SIP OF WATER: pantoprazole (Protonix), levothyroxine (Synthroid), carvedilol (Coreg), fluoxetine (Prozac), pregabalin (Lyrica). You may take alprazolam (Xanax), Tylenol OR Oxycodone if needed. You may use your Flonase nasal spray, Trelegy Ellipta and Albuterol inhalers if needed.                     You may not have any metal on your body including hair pins, jewelry, and body piercing  Do not wear make-up, lotions, powders, perfumes or deodorant  Do not wear nail polish including gel and S&S, artificial / acrylic nails, or any other type of covering on natural nails including finger and toenails. If you have artificial nails, gel coating, etc., that needs to be removed by a nail salon, Please have this removed prior to surgery. Not doing so may mean that your surgery could be cancelled or delayed if the Surgeon or anesthesia staff feels like they are unable to monitor you safely.   Do not shave 48 hours prior to surgery to avoid nicks in your skin which may contribute to postoperative infections.    Contacts, Hearing Aids, dentures or bridgework may not be worn  into surgery. DENTURES WILL BE REMOVED PRIOR TO SURGERY PLEASE DO NOT APPLY "Poly grip" OR ADHESIVES!!!   Patients discharged on the day of surgery will not be allowed to drive home.  Someone NEEDS to stay with you for the first 24 hours after anesthesia.  Do not bring your home medications to the hospital EXCEPT BRING YOUR ALBUTEROL INHALER WITH YOU ON YOUR SURGERY DAY. The Pharmacy will dispense medications listed on your medication list to you during your admission in the Hospital.  Special Instructions: Bring a copy of your healthcare power of  attorney and living will documents the day of surgery, if you wish to have them scanned into your  Medical Records- EPIC  Please read over the following fact sheets you were given: IF YOU HAVE QUESTIONS ABOUT YOUR PRE-OP INSTRUCTIONS, PLEASE CALL [phone].   [place] - Preparing for Surgery Before surgery, you can play an important role.  Because skin is not sterile, your skin needs to be as free of germs as possible.  You can reduce the number of germs on your skin by washing with CHG (chlorahexidine gluconate) soap before surgery.  CHG is an antiseptic cleaner which kills germs and bonds with the skin to continue killing germs even after washing. Please DO NOT use if you have an allergy to CHG or antibacterial soaps.  If your skin becomes reddened/irritated stop using the CHG and inform your nurse when you arrive at Short Stay. Do not shave (including legs and underarms) for at least 48 hours prior to the first CHG shower.  You may shave your face/neck.  Please follow these instructions carefully:  1.  Shower with CHG Soap the night before surgery and the  morning of surgery.  2.  If you choose to wash your hair, wash your hair first as usual with your normal  shampoo.  3.  After you shampoo, rinse your hair and body thoroughly to remove the shampoo.                             4.  Use CHG as you would any other liquid soap.  You can apply chg directly to the skin and wash.  Gently with a scrungie or clean washcloth.  5.  Apply the CHG Soap to your body ONLY FROM THE NECK DOWN.   Do not use on face/ open                           Wound or open sores. Avoid contact with eyes, ears mouth and genitals (private parts).                       Wash face,  Genitals (private parts) with your normal soap.             6.  Wash thoroughly, paying special attention to the area where your  surgery  will be performed.  7.  Thoroughly rinse your body with warm water from the neck down.  8.  DO  NOT shower/wash with your normal soap after using and rinsing off the CHG Soap.            9.  Pat yourself dry with a clean towel.            10.  Wear clean pajamas.            11.  Place  clean sheets on your bed the night of your first shower and do not  sleep with pets.  ON THE DAY OF SURGERY : Do not apply any lotions/deodorants the morning of surgery.  Please wear clean clothes to the hospital/surgery center.    FAILURE TO FOLLOW THESE INSTRUCTIONS MAY RESULT IN THE CANCELLATION OF YOUR SURGERY  PATIENT SIGNATURE_________________________________  NURSE SIGNATURE__________________________________  ________________________________________________________________________

## 2022-08-27 ENCOUNTER — Encounter (HOSPITAL_COMMUNITY)
Admission: RE | Admit: 2022-08-27 | Discharge: 2022-08-27 | Disposition: A | Discharge status: Home / Self Care | Payer: Medicare PPO | Source: Ambulatory Visit | Attending: Anesthesiology | Admitting: Anesthesiology

## 2022-08-27 ENCOUNTER — Encounter (HOSPITAL_COMMUNITY): Payer: Self-pay

## 2022-08-27 ENCOUNTER — Other Ambulatory Visit: Payer: Self-pay

## 2022-08-27 HISTORY — DX: Hypothyroidism, unspecified: E03.9

## 2022-08-28 ENCOUNTER — Telehealth: Payer: Self-pay | Admitting: *Deleted

## 2022-08-28 NOTE — Progress Notes (Signed)
  Care Coordination Note  08/28/2022 Name: Allison Jimenez MRN: [id number] DOB: [birth year]  Allison Jimenez is a 72 y.o. year old female who is a primary care patient of Cox, Kirsten, MD and is actively engaged with the care management team. I reached out to [name] by phone today to assist with re-scheduling a follow up visit with the RN Case Manager  Follow up plan: We have been unable to make contact with the patient for follow up.   Burman Nieves, CCMA Care Coordination Care Guide Direct Dial: [phone]

## 2022-08-29 DIAGNOSIS — S22080A Wedge compression fracture of T11-T12 vertebra, initial encounter for closed fracture: Secondary | ICD-10-CM | POA: Diagnosis not present

## 2022-08-29 DIAGNOSIS — S22070A Wedge compression fracture of T9-T10 vertebra, initial encounter for closed fracture: Secondary | ICD-10-CM | POA: Diagnosis not present

## 2022-08-29 DIAGNOSIS — S32030A Wedge compression fracture of third lumbar vertebra, initial encounter for closed fracture: Secondary | ICD-10-CM | POA: Diagnosis not present

## 2022-08-29 DIAGNOSIS — M8008XA Age-related osteoporosis with current pathological fracture, vertebra(e), initial encounter for fracture: Secondary | ICD-10-CM | POA: Diagnosis not present

## 2022-08-29 DIAGNOSIS — Z6823 Body mass index (BMI) 23.0-23.9, adult: Secondary | ICD-10-CM | POA: Diagnosis not present

## 2022-08-31 DIAGNOSIS — J189 Pneumonia, unspecified organism: Secondary | ICD-10-CM | POA: Diagnosis not present

## 2022-08-31 DIAGNOSIS — J440 Chronic obstructive pulmonary disease with acute lower respiratory infection: Secondary | ICD-10-CM | POA: Diagnosis not present

## 2022-08-31 DIAGNOSIS — J449 Chronic obstructive pulmonary disease, unspecified: Secondary | ICD-10-CM | POA: Diagnosis not present

## 2022-08-31 DIAGNOSIS — B961 Klebsiella pneumoniae [K. pneumoniae] as the cause of diseases classified elsewhere: Secondary | ICD-10-CM | POA: Diagnosis not present

## 2022-08-31 DIAGNOSIS — M6281 Muscle weakness (generalized): Secondary | ICD-10-CM | POA: Diagnosis not present

## 2022-08-31 DIAGNOSIS — N390 Urinary tract infection, site not specified: Secondary | ICD-10-CM | POA: Diagnosis not present

## 2022-08-31 DIAGNOSIS — F17210 Nicotine dependence, cigarettes, uncomplicated: Secondary | ICD-10-CM | POA: Diagnosis not present

## 2022-08-31 DIAGNOSIS — W19XXXA Unspecified fall, initial encounter: Secondary | ICD-10-CM | POA: Diagnosis not present

## 2022-08-31 DIAGNOSIS — G9341 Metabolic encephalopathy: Secondary | ICD-10-CM | POA: Diagnosis not present

## 2022-09-04 ENCOUNTER — Encounter (HOSPITAL_COMMUNITY): Payer: Self-pay

## 2022-09-04 ENCOUNTER — Encounter (HOSPITAL_COMMUNITY)
Admission: RE | Admit: 2022-09-04 | Discharge: 2022-09-04 | Disposition: A | Discharge status: Home / Self Care | Payer: Medicare PPO | Source: Ambulatory Visit | Attending: Family Medicine | Admitting: Family Medicine

## 2022-09-04 ENCOUNTER — Other Ambulatory Visit: Payer: Self-pay

## 2022-09-04 HISTORY — DX: Pneumonia, unspecified organism: J18.9

## 2022-09-04 HISTORY — DX: Gastro-esophageal reflux disease without esophagitis: K21.9

## 2022-09-04 NOTE — Progress Notes (Addendum)
Anesthesia Review:  PCP: [facility]  LOV 8/23 /24  Cardiologist : none  Pulm- 05/23/22- Allison Jimenez- Allison Jimenez  Chest x-ray : 1v-07/28/22   CT chest- 06/23/22 - shows rib fractures and ateleactasis  EKG : 5.29.24  Echo : 2021  Stress test: Cardiac Cath :  Activity level: cannot do a flgiht of stairs without difficutly  Sleep Study/ CPAP : none  Fasting Blood Sugar :      / Checks Blood Sugar -- times a day:   Blood Thinner/ Instructions /Last Dose: ASA / Instructions/ Last Dose :    Labs- hgba1c-08/08/22- 5.6  CBC and CMP- 08/08/22    Smoker- 1/2 ppd pt awre no smoking after midnite.     Completed med hx and preop instrucitons via phone appt on 09/04/22.

## 2022-09-04 NOTE — Patient Instructions (Addendum)
SURGICAL WAITING ROOM VISITATION  Patients having surgery or a procedure may have no more than 2 support people in the waiting area - these visitors may rotate.    Children under the age of 70 must have an adult with them who is not the patient.  Due to an increase in RSV and influenza rates and associated hospitalizations, children ages 61 and under may not visit patients in [facility] hospitals.  If the patient needs to stay at the hospital during part of their recovery, the visitor guidelines for inpatient rooms apply. Pre-op nurse will coordinate an appropriate time for 1 support person to accompany patient in pre-op.  This support person may not rotate.    Please refer to the [facility] website for the visitor guidelines for Inpatients (after your surgery is over and you are in a regular room).       Your procedure is scheduled on:  09/05/2022    Report to [facility] Main Entrance    Report to admitting at   0600 AM   Call this number if you have problems the morning of surgery [phone]   Do not eat food : or drink liquids After Midnight.                    If you have questions, please contact your surgeon's office.       Oral Hygiene is also important to reduce your risk of infection.                                    Remember - BRUSH YOUR TEETH THE MORNING OF SURGERY WITH YOUR REGULAR TOOTHPASTE  DENTURES WILL BE REMOVED PRIOR TO SURGERY PLEASE DO NOT APPLY "Poly grip" OR ADHESIVES!!!   Do NOT smoke after Midnight   Stop all vitamins and herbal supplements 7 days before surgery.   Take these medicines the morning of surgery with A SIP OF WATER:  inhalers as usual and bring, coreg, zyrtec if needed, protonxoi, synthroid, lyrica  DO NOT TAKE ANY ORAL DIABETIC MEDICATIONS DAY OF YOUR SURGERY  Bring CPAP mask and tubing day of surgery.                              You may not have any metal on your body including hair pins, jewelry, and body  piercing             Do not wear make-up, lotions, powders, perfumes/cologne, or deodorant  Do not wear nail polish including gel and S&S, artificial/acrylic nails, or any other type of covering on natural nails including finger and toenails. If you have artificial nails, gel coating, etc. that needs to be removed by a nail salon please have this removed prior to surgery or surgery may need to be canceled/ delayed if the surgeon/ anesthesia feels like they are unable to be safely monitored.   Do not shave  48 hours prior to surgery.               Men may shave face and neck.   Do not bring valuables to the hospital. [facility] IS NOT             RESPONSIBLE   FOR VALUABLES.   Contacts, glasses, dentures or bridgework may not be worn into surgery.   Bring small overnight bag day  of surgery.   DO NOT BRING YOUR HOME MEDICATIONS TO THE HOSPITAL. PHARMACY WILL DISPENSE MEDICATIONS LISTED ON YOUR MEDICATION LIST TO YOU DURING YOUR ADMISSION IN THE HOSPITAL!    Patients discharged on the day of surgery will not be allowed to drive home.  Someone NEEDS to stay with you for the first 24 hours after anesthesia.   Special Instructions: Bring a copy of your healthcare power of attorney and living will documents the day of surgery if you haven't scanned them before.              Please read over the following fact sheets you were given: IF YOU HAVE QUESTIONS ABOUT YOUR PRE-OP INSTRUCTIONS PLEASE CALL [phone]   If you received a COVID test during your pre-op visit  it is requested that you wear a mask when out in public, stay away from anyone that may not be feeling well and notify your surgeon if you develop symptoms. If you test positive for Covid or have been in contact with anyone that has tested positive in the last 10 days please notify you surgeon.    [facility] - Preparing for Surgery Before surgery, you can play an important role.  Because skin is not sterile, your skin needs to be  as free of germs as possible.  You can reduce the number of germs on your skin by washing with CHG (chlorahexidine gluconate) soap before surgery.  CHG is an antiseptic cleaner which kills germs and bonds with the skin to continue killing germs even after washing. Please DO NOT use if you have an allergy to CHG or antibacterial soaps.  If your skin becomes reddened/irritated stop using the CHG and inform your nurse when you arrive at Short Stay. Do not shave (including legs and underarms) for at least 48 hours prior to the first CHG shower.  You may shave your face/neck. Please follow these instructions carefully:  1.  Shower with CHG Soap the night before surgery and the  morning of Surgery.  2.  If you choose to wash your hair, wash your hair first as usual with your  normal  shampoo.  3.  After you shampoo, rinse your hair and body thoroughly to remove the  shampoo.                           4.  Use CHG as you would any other liquid soap.  You can apply chg directly  to the skin and wash                       Gently with a scrungie or clean washcloth.  5.  Apply the CHG Soap to your body ONLY FROM THE NECK DOWN.   Do not use on face/ open                           Wound or open sores. Avoid contact with eyes, ears mouth and genitals (private parts).                       Wash face,  Genitals (private parts) with your normal soap.             6.  Wash thoroughly, paying special attention to the area where your surgery  will be performed.  7.  Thoroughly rinse your body with warm water from the  neck down.  8.  DO NOT shower/wash with your normal soap after using and rinsing off  the CHG Soap.                9.  Pat yourself dry with a clean towel.            10.  Wear clean pajamas.            11.  Place clean sheets on your bed the night of your first shower and do not  sleep with pets. Day of Surgery : Do not apply any lotions/deodorants the morning of surgery.  Please wear clean clothes to the  hospital/surgery center.  FAILURE TO FOLLOW THESE INSTRUCTIONS MAY RESULT IN THE CANCELLATION OF YOUR SURGERY PATIENT SIGNATURE_________________________________  NURSE SIGNATURE__________________________________  ________________________________________________________________________

## 2022-09-04 NOTE — Anesthesia Preprocedure Evaluation (Addendum)
Anesthesia Evaluation  Patient identified by MRN, date of birth, ID band Patient awake    Reviewed: Allergy & Precautions, NPO status , Patient's Chart, lab work & pertinent test results  Airway Mallampati: I  TM Distance: >3 FB Neck ROM: Full    Dental no notable dental hx. (+) Edentulous Upper, Chipped, Missing   Pulmonary pneumonia, COPD,  COPD inhaler, Current SmokerPatient did not abstain from smoking.   Pulmonary exam normal breath sounds clear to auscultation       Cardiovascular hypertension, Normal cardiovascular exam Rhythm:Regular Rate:Normal  2021 echo NL EF 60-65%   Neuro/Psych   Anxiety Depression   Dementia    GI/Hepatic ,GERD  Medicated and Controlled,,  Endo/Other  Hypothyroidism    Renal/GU Lab Results      Component                Value               Date                      NA                       138                 08/08/2022                CL                       105                 08/08/2022                K                        5.2                 08/08/2022                CO2                      20                  08/08/2022                BUN                      7 (L)               08/08/2022                CREATININE               0.58                08/08/2022                EGFR                     97                  08/08/2022                CALCIUM                  8.9  08/08/2022                   Musculoskeletal  (+) Arthritis ,    Abdominal   Peds  Hematology Lab Results      Component                Value               Date                      WBC                      12.0 (H)            08/08/2022                HGB                      10.9 (L)            08/08/2022                HCT                      34.8                08/08/2022                MCV                      90                  08/08/2022                PLT                      556 (H)              08/08/2022              Anesthesia Other Findings   Reproductive/Obstetrics                             Anesthesia Physical Anesthesia Plan  ASA: 3  Anesthesia Plan: General   Post-op Pain Management: Precedex and Tylenol PO (pre-op)*   Induction: Intravenous  PONV Risk Score and Plan: 3 and Treatment may vary due to age or medical condition and Ondansetron  Airway Management Planned: LMA and Oral ETT  Additional Equipment: None  Intra-op Plan:   Post-operative Plan: Extubation in OR  Informed Consent: I have reviewed the patients History and Physical, chart, labs and discussed the procedure including the risks, benefits and alternatives for the proposed anesthesia with the patient or authorized representative who has indicated his/her understanding and acceptance.     Dental advisory given  Plan Discussed with: CRNA  Anesthesia Plan Comments:        Anesthesia Quick Evaluation

## 2022-09-05 ENCOUNTER — Other Ambulatory Visit: Payer: Self-pay | Admitting: Family Medicine

## 2022-09-05 ENCOUNTER — Encounter (HOSPITAL_COMMUNITY): Payer: Self-pay | Admitting: Urology

## 2022-09-05 ENCOUNTER — Ambulatory Visit (HOSPITAL_COMMUNITY)
Admission: RE | Admit: 2022-09-05 | Discharge: 2022-09-05 | Disposition: A | Discharge status: Home / Self Care | Payer: Medicare PPO | Attending: Urology | Admitting: Urology

## 2022-09-05 ENCOUNTER — Other Ambulatory Visit: Payer: Self-pay

## 2022-09-05 ENCOUNTER — Ambulatory Visit (HOSPITAL_BASED_OUTPATIENT_CLINIC_OR_DEPARTMENT_OTHER): Payer: Medicare PPO | Admitting: Anesthesiology

## 2022-09-05 ENCOUNTER — Encounter (HOSPITAL_COMMUNITY)
Admission: RE | Disposition: A | Discharge status: Home / Self Care | Payer: Self-pay | Source: Home / Self Care | Attending: Urology

## 2022-09-05 ENCOUNTER — Ambulatory Visit (HOSPITAL_COMMUNITY): Payer: Medicare PPO | Admitting: Physician Assistant

## 2022-09-05 ENCOUNTER — Ambulatory Visit (HOSPITAL_COMMUNITY): Payer: Medicare PPO

## 2022-09-05 DIAGNOSIS — K219 Gastro-esophageal reflux disease without esophagitis: Secondary | ICD-10-CM | POA: Diagnosis not present

## 2022-09-05 DIAGNOSIS — E039 Hypothyroidism, unspecified: Secondary | ICD-10-CM | POA: Insufficient documentation

## 2022-09-05 DIAGNOSIS — N201 Calculus of ureter: Secondary | ICD-10-CM | POA: Insufficient documentation

## 2022-09-05 DIAGNOSIS — F17210 Nicotine dependence, cigarettes, uncomplicated: Secondary | ICD-10-CM | POA: Diagnosis not present

## 2022-09-05 DIAGNOSIS — Z79899 Other long term (current) drug therapy: Secondary | ICD-10-CM | POA: Diagnosis not present

## 2022-09-05 DIAGNOSIS — E782 Mixed hyperlipidemia: Secondary | ICD-10-CM | POA: Diagnosis not present

## 2022-09-05 DIAGNOSIS — J432 Centrilobular emphysema: Secondary | ICD-10-CM | POA: Insufficient documentation

## 2022-09-05 DIAGNOSIS — I10 Essential (primary) hypertension: Secondary | ICD-10-CM | POA: Diagnosis not present

## 2022-09-05 DIAGNOSIS — F32A Depression, unspecified: Secondary | ICD-10-CM | POA: Diagnosis not present

## 2022-09-05 DIAGNOSIS — F419 Anxiety disorder, unspecified: Secondary | ICD-10-CM | POA: Insufficient documentation

## 2022-09-05 DIAGNOSIS — E1159 Type 2 diabetes mellitus with other circulatory complications: Secondary | ICD-10-CM

## 2022-09-05 HISTORY — PX: CYSTOSCOPY/URETEROSCOPY/HOLMIUM LASER/STENT PLACEMENT: SHX6546

## 2022-09-05 SURGERY — CYSTOSCOPY/URETEROSCOPY/HOLMIUM LASER/STENT PLACEMENT
Anesthesia: General | Laterality: Right

## 2022-09-05 MED ORDER — ORAL CARE MOUTH RINSE: 15.0000 mL | Freq: Once | OROMUCOSAL | Status: AC

## 2022-09-05 MED ORDER — ONDANSETRON HCL 4 MG/2ML IJ SOLN: 4.0000 mg | Freq: Once | INTRAMUSCULAR | Status: DC | PRN

## 2022-09-05 MED ORDER — FENTANYL CITRATE (PF) 100 MCG/2ML IJ SOLN
INTRAMUSCULAR | Status: DC | PRN
  Administered 2022-09-05: 50 ug via INTRAVENOUS
  Administered 2022-09-05: 25 ug via INTRAVENOUS

## 2022-09-05 MED ORDER — LIDOCAINE 2% (20 MG/ML) 5 ML SYRINGE
INTRAMUSCULAR | Status: DC | PRN
  Administered 2022-09-05: 40 mg via INTRAVENOUS

## 2022-09-05 MED ORDER — CIPROFLOXACIN IN D5W 400 MG/200ML IV SOLN
400.0000 mg | INTRAVENOUS | Status: AC
  Administered 2022-09-05: 400 mg via INTRAVENOUS
  Filled 2022-09-05: qty 200

## 2022-09-05 MED ORDER — CHLORHEXIDINE GLUCONATE 0.12 % MT SOLN
15.0000 mL | Freq: Once | OROMUCOSAL | Status: AC
  Administered 2022-09-05: 15 mL via OROMUCOSAL

## 2022-09-05 MED ORDER — CELECOXIB 100 MG PO CAPS: 100.0000 mg | ORAL_CAPSULE | Freq: Two times a day (BID) | ORAL | 1 refills | Status: DC | PRN

## 2022-09-05 MED ORDER — ONDANSETRON HCL 4 MG/2ML IJ SOLN
INTRAMUSCULAR | Status: DC | PRN
  Administered 2022-09-05: 4 mg via INTRAVENOUS

## 2022-09-05 MED ORDER — DEXAMETHASONE SODIUM PHOSPHATE 10 MG/ML IJ SOLN
INTRAMUSCULAR | Status: DC | PRN
  Administered 2022-09-05: 5 mg via INTRAVENOUS

## 2022-09-05 MED ORDER — HYOSCYAMINE SULFATE 0.125 MG SL SUBL: 0.1250 mg | SUBLINGUAL_TABLET | Freq: Four times a day (QID) | SUBLINGUAL | 0 refills | Status: DC | PRN

## 2022-09-05 MED ORDER — ACETAMINOPHEN 10 MG/ML IV SOLN: 1000.0000 mg | Freq: Once | INTRAVENOUS | Status: DC | PRN

## 2022-09-05 MED ORDER — IOHEXOL 300 MG/ML  SOLN
INTRAMUSCULAR | Status: DC | PRN
  Administered 2022-09-05: 5 mL

## 2022-09-05 MED ORDER — FENTANYL CITRATE (PF) 100 MCG/2ML IJ SOLN
INTRAMUSCULAR | Status: AC
  Filled 2022-09-05: qty 2

## 2022-09-05 MED ORDER — SODIUM CHLORIDE 0.9 % IR SOLN
Status: DC | PRN
  Administered 2022-09-05: 3000 mL via INTRAVESICAL

## 2022-09-05 MED ORDER — LACTATED RINGERS IV SOLN: INTRAVENOUS | Status: DC

## 2022-09-05 MED ORDER — LIDOCAINE HCL (PF) 2 % IJ SOLN
INTRAMUSCULAR | Status: AC
  Filled 2022-09-05: qty 5

## 2022-09-05 MED ORDER — DEXAMETHASONE SODIUM PHOSPHATE 10 MG/ML IJ SOLN
INTRAMUSCULAR | Status: AC
  Filled 2022-09-05: qty 1

## 2022-09-05 MED ORDER — ONDANSETRON HCL 4 MG/2ML IJ SOLN
INTRAMUSCULAR | Status: AC
  Filled 2022-09-05: qty 2

## 2022-09-05 MED ORDER — PROPOFOL 10 MG/ML IV BOLUS
INTRAVENOUS | Status: DC | PRN
  Administered 2022-09-05: 80 mg via INTRAVENOUS

## 2022-09-05 MED ORDER — PROPOFOL 10 MG/ML IV BOLUS
INTRAVENOUS | Status: AC
  Filled 2022-09-05: qty 20

## 2022-09-05 MED ORDER — SULFAMETHOXAZOLE-TRIMETHOPRIM 800-160 MG PO TABS: 1.0000 | ORAL_TABLET | Freq: Two times a day (BID) | ORAL | 0 refills | Status: DC

## 2022-09-05 MED ORDER — FENTANYL CITRATE PF 50 MCG/ML IJ SOSY: 25.0000 ug | PREFILLED_SYRINGE | INTRAMUSCULAR | Status: DC | PRN

## 2022-09-05 MED ORDER — 0.9 % SODIUM CHLORIDE (POUR BTL) OPTIME
TOPICAL | Status: DC | PRN
Start: 2022-09-05 — End: 2022-09-05
  Administered 2022-09-05: 1000 mL

## 2022-09-05 MED ORDER — TRAMADOL HCL 50 MG PO TABS
50.0000 mg | ORAL_TABLET | Freq: Four times a day (QID) | ORAL | 0 refills | Status: AC | PRN
Start: 2022-09-05 — End: 2022-09-08

## 2022-09-05 SURGICAL SUPPLY — 26 items
BAG COUNTER SPONGE SURGICOUNT (BAG) IMPLANT
BAG SPNG CNTER NS LX DISP (BAG)
BAG URO CATCHER STRL LF (MISCELLANEOUS) ×1 IMPLANT
BASKET ZERO TIP NITINOL 2.4FR (BASKET) IMPLANT
BSKT STON RTRVL ZERO TP 2.4FR (BASKET) ×1
CATH URETERAL DUAL LUMEN 10F (MISCELLANEOUS) ×1 IMPLANT
CATH URETL OPEN END 6FR 70 (CATHETERS) IMPLANT
CLOTH BEACON ORANGE TIMEOUT ST (SAFETY) ×1 IMPLANT
GLOVE SS BIOGEL STRL SZ 7 (GLOVE) ×1 IMPLANT
GLOVE SURG LX STRL 7.5 STRW (GLOVE) ×1 IMPLANT
GOWN STRL REUS W/ TWL XL LVL3 (GOWN DISPOSABLE) ×1 IMPLANT
GOWN STRL REUS W/TWL XL LVL3 (GOWN DISPOSABLE) ×1
GUIDEWIRE STR DUAL SENSOR (WIRE) ×1 IMPLANT
GUIDEWIRE ZIPWRE .038 STRAIGHT (WIRE) ×1 IMPLANT
IV NS 1000ML (IV SOLUTION) ×1
IV NS 1000ML BAXH (IV SOLUTION) ×1 IMPLANT
KIT TURNOVER KIT A (KITS) IMPLANT
LASER FIB FLEXIVA PULSE ID 365 (Laser) IMPLANT
MANIFOLD NEPTUNE II (INSTRUMENTS) ×1 IMPLANT
PACK CYSTO (CUSTOM PROCEDURE TRAY) ×1 IMPLANT
SHEATH NAVIGATOR HD 11/13X36 (SHEATH) IMPLANT
STENT URET 6FRX24 CONTOUR (STENTS) IMPLANT
TRACTIP FLEXIVA PULS ID 200XHI (Laser) IMPLANT
TRACTIP FLEXIVA PULSE ID 200 (Laser) ×1
TUBING CONNECTING 10 (TUBING) ×1 IMPLANT
TUBING UROLOGY SET (TUBING) ×1 IMPLANT

## 2022-09-05 NOTE — Discharge Instructions (Signed)

## 2022-09-05 NOTE — Transfer of Care (Signed)
Immediate Anesthesia Transfer of Care Note  Patient: Allison Jimenez  Procedure(s) Performed: RIGHT URETEROSCOPY/HOLMIUM LASER/STENT PLACEMENT (Right)  Patient Location: PACU  Anesthesia Type:General  Level of Consciousness: awake and drowsy  Airway & Oxygen Therapy: Patient Spontanous Breathing and Patient connected to face mask oxygen  Post-op Assessment: Report given to RN and Post -op Vital signs reviewed and stable  Post vital signs: Reviewed and stable  Last Vitals:  Vitals Value Taken Time  BP 113/60 09/05/22 0930  Temp 36.4 C 09/05/22 0930  Pulse 87 09/05/22 0934  Resp 13 09/05/22 0934  SpO2 100 % 09/05/22 0934  Vitals shown include unfiled device data.  Last Pain:  Vitals:   09/05/22 0930  TempSrc:   PainSc: 0-No pain         Complications: No notable events documented.

## 2022-09-05 NOTE — Anesthesia Postprocedure Evaluation (Signed)
Anesthesia Post Note  Patient: Allison Jimenez  Procedure(s) Performed: RIGHT URETEROSCOPY/HOLMIUM LASER/STENT PLACEMENT (Right)     Patient location during evaluation: PACU Anesthesia Type: General Level of consciousness: awake and alert Pain management: pain level controlled Vital Signs Assessment: post-procedure vital signs reviewed and stable Respiratory status: spontaneous breathing, nonlabored ventilation, respiratory function stable and patient connected to nasal cannula oxygen Cardiovascular status: blood pressure returned to baseline and stable Postop Assessment: no apparent nausea or vomiting Anesthetic complications: no   No notable events documented.  Last Vitals:  Vitals:   09/05/22 1013 09/05/22 1015  BP: (!) 160/85 (!) 160/85  Pulse: 87 87  Resp: 20   Temp: (!) 36.4 C   SpO2: 100% 100%    Last Pain:  Vitals:   09/05/22 1013  TempSrc: Oral  PainSc: 0-No pain                 Trevor Iha

## 2022-09-05 NOTE — Anesthesia Procedure Notes (Signed)
Procedure Name: LMA Insertion Date/Time: 09/05/2022 8:39 AM  Performed by: [place], Clydene Pugh, CRNAPre-anesthesia Checklist: Patient identified, Emergency Drugs available, Suction available and Patient being monitored Patient Re-evaluated:Patient Re-evaluated prior to induction Oxygen Delivery Method: Circle system utilized Preoxygenation: Pre-oxygenation with 100% oxygen Induction Type: IV induction Ventilation: Mask ventilation without difficulty LMA: LMA inserted LMA Size: 4.0 Number of attempts: 1 Airway Equipment and Method: Bite block Placement Confirmation: positive ETCO2 Tube secured with: Tape Dental Injury: Teeth and Oropharynx as per pre-operative assessment

## 2022-09-05 NOTE — H&P (Signed)
H&P  History of Present Illness: Allison Jimenez is a 72 y.o. year old F who presents today for treatment of a right ureteral stone. She is s/p stent placement with Dr Ronne Binning  No acute complaints  Past Medical History:  Diagnosis Date   Acute blood loss anemia (ABLA) 09/05/2020   Allergy    ANXIETY 06/05/2006   Arthritis    SHOULDERS    BACK PAIN 01/05/2010   Cataract    bilateral, left worse   Centrilobular emphysema (HCC)    COPD (chronic obstructive pulmonary disease) (HCC)    Depression    DIVERTICULOSIS, COLON 06/05/2006   DIZZINESS OR VERTIGO 06/05/2006   positional   GERD (gastroesophageal reflux disease)    Hearing loss in left ear    no hearing aid   History of blood transfusion 08/2020   History of kidney stones    passed stones   HYPERLIPIDEMIA 06/05/2006   Hypertension    HYPOKALEMIA 12/14/2008   Hypothyroidism    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   Qualifier: Diagnosis of  By: Amador Cunas  MD, Janett Labella    Pneumonia    TOBACCO USER 11/25/2007    Past Surgical History:  Procedure Laterality Date   APPENDECTOMY     bilateral cataract surgery      BRONCHIAL BIOPSY  09/28/2021   Procedure: BRONCHIAL BIOPSIES;  Surgeon: Omar Person, MD;  Location: [facility] ENDOSCOPY;  Service: Pulmonary;;   BRONCHIAL NEEDLE ASPIRATION BIOPSY  09/28/2021   Procedure: BRONCHIAL NEEDLE ASPIRATION BIOPSIES;  Surgeon: Omar Person, MD;  Location: [facility] ENDOSCOPY;  Service: Pulmonary;;   BRONCHIAL WASHINGS  09/28/2021   Procedure: BRONCHIAL WASHINGS;  Surgeon: Omar Person, MD;  Location: [facility] ENDOSCOPY;  Service: Pulmonary;;   CESAREAN SECTION     x1   CHOLECYSTECTOMY     COLONOSCOPY     COSMETIC SURGERY     CYSTOSCOPY W/ URETERAL STENT PLACEMENT Right 07/29/2022   Procedure: CYSTOSCOPY WITH RETROGRADE PYELOGRAM/URETERAL STENT PLACEMENT;  Surgeon: Malen Gauze, MD;  Location: [facility] ORS;  Service: Urology;  Laterality: Right;   mastoid surgery  1992   shunt in mastoid-  endolymphatic sac in left ear    MRI  07/11/2020   x several, last one located in CE   TUBAL LIGATION     UPPER GI ENDOSCOPY     vocal cord surgery     nodule x2    Home Medications:  Current Meds  Medication Sig   acetaminophen (TYLENOL) 500 MG tablet Take 500-1,000 mg by mouth every 6 (six) hours as needed for mild pain, moderate pain or headache.   albuterol (VENTOLIN HFA) 108 (90 Base) MCG/ACT inhaler Inhale 2 puffs into the lungs every 6 (six) hours as needed for shortness of breath or wheezing.   ALPRAZolam (XANAX) 0.25 MG tablet Take 0.125-0.25 mg by mouth 3 (three) times daily as needed for anxiety.   atorvastatin (LIPITOR) 20 MG tablet TAKE 1 TABLET(20 MG) BY MOUTH DAILY   calcitonin, salmon, (MIACALCIN/FORTICAL) 200 UNIT/ACT nasal spray Place 1 spray into alternate nostrils daily.   Carboxymethylcellul-Glycerin (LUBRICATING EYE DROPS OP) Place 1 drop into both eyes daily.   carvedilol (COREG) 25 MG tablet Take 1 tablet (25 mg total) by mouth 2 (two) times daily with a meal.   cetirizine (ZYRTEC) 10 MG tablet Take 10 mg by mouth daily as needed for allergies.   chlorthalidone (HYGROTON) 25 MG tablet Take 25 mg by mouth daily.   Cholecalciferol (VITAMIN D) 50 MCG (2000 UT)  CAPS Take 2,000 Units by mouth daily.   conjugated estrogens (PREMARIN) vaginal cream Place 1 applicator vaginally daily as needed (dryness / burning).   cyclobenzaprine (FLEXERIL) 10 MG tablet Take 1 tablet (10 mg total) by mouth 3 (three) times daily as needed for muscle spasms.   diclofenac Sodium (VOLTAREN) 1 % GEL Apply 1 Application topically 4 (four) times daily as needed (pain).   Erenumab-aooe (AIMOVIG) 140 MG/ML SOAJ Inject 140 mg as directed every 30 (thirty) days.   famotidine (PEPCID) 40 MG tablet TAKE 1 TABLET(40 MG) BY MOUTH AT BEDTIME (Patient taking differently: Take 40 mg by mouth at bedtime.)   FLUoxetine (PROZAC) 20 MG capsule Take 2 capsules (40 mg total) by mouth daily.   fluticasone  (FLONASE) 50 MCG/ACT nasal spray SHAKE LIQUID AND USE 2 SPRAYS IN EACH NOSTRIL DAILY (Patient taking differently: Place 2 sprays into both nostrils daily as needed for allergies.)   Fluticasone-Umeclidin-Vilant (TRELEGY ELLIPTA) 100-62.5-25 MCG/ACT AEPB Inhale 1 puff into the lungs daily.   levothyroxine (SYNTHROID) 25 MCG tablet Take 1 tablet (25 mcg total) by mouth daily.   linaclotide (LINZESS) 145 MCG CAPS capsule Take 1 capsule (145 mcg total) by mouth daily as needed (constipation).   memantine (NAMENDA) 5 MG tablet Take 1 tablet (5 mg total) by mouth at bedtime.   montelukast (SINGULAIR) 10 MG tablet Take 10 mg by mouth at bedtime.   OVER THE COUNTER MEDICATION Take 1 tablet by mouth in the morning. Salt Supplement   oxyCODONE-acetaminophen (PERCOCET) 10-325 MG tablet Take 1 tablet by mouth 4 (four) times daily as needed for pain.   pantoprazole (PROTONIX) 40 MG tablet TAKE 1 TABLET(40 MG) BY MOUTH DAILY   potassium chloride (MICRO-K) 10 MEQ CR capsule Take 2 capsules (20 mEq total) by mouth 2 (two) times daily.   pregabalin (LYRICA) 25 MG capsule Take 25 mg by mouth 3 (three) times daily.   pregabalin (LYRICA) 50 MG capsule Take 50 mg by mouth 2 (two) times daily as needed (anxiety).   promethazine (PHENERGAN) 25 MG tablet TAKE 1 TABLET BY MOUTH EVERY 8 HOURS IF NEEDED   vitamin B-12 (CYANOCOBALAMIN) 500 MCG tablet Take 500 mcg by mouth daily at 12 noon.    Allergies:  Allergies  Allergen Reactions   Erythromycin Base Diarrhea   Vfend [Voriconazole] Other (See Comments)    Visual changes   Penicillins Rash    Family History  Problem Relation Age of Onset   Breast cancer Mother    Migraines Other    Depression Other    Anxiety disorder Other    High Cholesterol Other    Colon cancer Neg Hx    Rectal cancer Neg Hx    Stomach cancer Neg Hx    Colon polyps Neg Hx    Esophageal cancer Neg Hx     Social History:  reports that she has been smoking cigarettes. She has a 24.5  pack-year smoking history. She has never used smokeless tobacco. She reports that she does not drink alcohol and does not use drugs.  ROS: A complete review of systems was performed.  All systems are negative except for pertinent findings as noted.  Physical Exam:  Vital signs in last 24 hours: Temp:  [98 F (36.7 C)] 98 F (36.7 C) (09/04 0700) Pulse Rate:  [86] 86 (09/04 0700) Resp:  [16] 16 (09/04 0700) BP: (111)/(60) 111/60 (09/04 0700) SpO2:  [96 %] 96 % (09/04 0700) Weight:  [44.7 kg] 44.7 kg (09/04 0702) Constitutional:  Alert and oriented, No acute distress Cardiovascular: Regular rate and rhythm Respiratory: Normal respiratory effort, Lungs clear bilaterally GI: Abdomen is soft, nontender, nondistended, no abdominal masses Lymphatic: No lymphadenopathy Neurologic: Grossly intact, no focal deficits Psychiatric: Normal mood and affect   Laboratory Data:  No results for input(s): "WBC", "HGB", "HCT", "PLT" in the last 72 hours.  No results for input(s): "NA", "K", "CL", "GLUCOSE", "BUN", "CALCIUM", "CREATININE" in the last 72 hours.  Invalid input(s): "CO3"   No results found for this or any previous visit (from the past 24 hour(s)). No results found for this or any previous visit (from the past 240 hour(s)).  Renal Function: No results for input(s): "CREATININE" in the last 168 hours. CrCl cannot be calculated (Patient's most recent lab result is older than the maximum 21 days allowed.).  Radiologic Imaging: No results found.  Assessment:  Allison Jimenez is a 72 y.o. year old F with right ureteral stone, s/p stent placement  Plan:  --to OR as planned for right ureteroscopy with laser litho, stent. Procedure and risks reviewed, including but not limited to hematuria, infection, sepsis, damage to GU tract, failure to complete procedure, retained stone fragments, need for future procedures, stent pain, prolonged stent.   Irine Seal, MD 09/05/2022, 8:19 AM   [facility] Pager: [phone]

## 2022-09-05 NOTE — Op Note (Signed)
Operative Note  Preoperative diagnosis:  1.  Right ureteral stone  Postoperative diagnosis: 1.  Right ureteral stone  Procedure(s): 1.  Right ureteroscopy with laser lithotripsy and basket extraction of stones 2. Cystoscopy  3. Right retrograde pyelogram 4. Right ureteral stent placement 6x24 cm 5. Fluoroscopy with intraoperative interpretation  Surgeon: Irine Seal, MD  Assistants:  None  Anesthesia:  General  Complications:  None  EBL:  Minimal  Specimens: 1. stones for stone analysis (to be done at [facility])  Drains/Catheters: 1.  Right 6Fr x 24cm ureteral stent without tether string  Intraoperative findings:   Cystoscopy demonstrated unremarkable bladder Right Ureteroscopy demonstrated stone in right ureter Successful stent placement.  Indication:  Allison Jimenez is a 72 y.o. female with the above diagnoses   Description of procedure: After informed consent was obtained from the patient, the patient was identified and taken to the operating room and placed in the supine position.  General anesthesia was administered as well as perioperative IV antibiotics.  At the beginning of the case, a time-out was performed to properly identify the patient, the surgery to be performed, and the surgical site.  Sequential compression devices were applied to the lower extremities at the beginning of the case for DVT prophylaxis.  The patient was then placed in the dorsal lithotomy supine position, prepped and draped in sterile fashion.  We then passed the 21-French rigid cystoscope through the urethra and into the bladder under vision without any difficulty , noting a normal urethra.  A systematic evaluation of the bladder revealed no evidence of any suspicious bladder lesions.  Ureteral orifices were in normal position.    The distal aspect of the ureteral stent was seen protruding from the right ureteral orifice.  We then used the alligator-tooth forceps and grasped the  distal end of the ureteral stent and brought it out the urethral meatus while watching the proximal coil straighten out nicely on fluoroscopy. Through the ureteral stent, we then passed a 0.038 sensor wire up to the level of the renal pelvis.  The ureteral stent was then removed, leaving the sensor wire up the ureter.  .   A semi-rigid ureteroscope was passed alongside the wire up the distal ureter which appeared normal. The stone was identified, and fragmented with the holmium laser. The fragments were removed with the zero tip basket under direct visualization. Some of the fragments were blown back into the kidney, so I removed the semi rigid scope and placed a flexible ureteroscope over the wire. I carefully inspected each calyx - I found one fragment, which was dusted with the laser. A retrograde pyelogram was done, which showed no hydronephrosis. There was a small amount of extrav from one of upper pole calcyces, not from the collecting sytem.  I carefully removed the flexible scope, examining the ureter as I withdrew - there were no stone fragments or damage to the ureter.    We then used the Glidewire under direct vision through the rigid cystoscope and under fluoroscopic guidance and passed up a 6-French, 24 cm double-pigtail ureteral stent up ureter, making sure that the proximal and distal ends coiled within the kidney and bladder respectively.  The cystoscope was then advanced back into the bladder under vision.  We were able to see the distal stent coiling nicely within the bladder.  The bladder was then emptied with irrigation solution.  The cystoscope was then removed.    The patient tolerated the procedure well and there was no  complication. Patient was awoken from anesthesia and taken to the recovery room in stable condition. I was present and scrubbed for the entirety of the case.  Plan:  Patient will be discharged home and will return to clinic in 2 weeks for stent removal  G. Irine Seal MD [facility]  Pager: [phone]

## 2022-09-06 ENCOUNTER — Encounter (HOSPITAL_COMMUNITY): Payer: Self-pay | Admitting: Urology

## 2022-09-11 ENCOUNTER — Other Ambulatory Visit: Payer: Self-pay | Admitting: Family Medicine

## 2022-09-11 DIAGNOSIS — J449 Chronic obstructive pulmonary disease, unspecified: Secondary | ICD-10-CM | POA: Diagnosis not present

## 2022-09-11 DIAGNOSIS — J440 Chronic obstructive pulmonary disease with acute lower respiratory infection: Secondary | ICD-10-CM | POA: Diagnosis not present

## 2022-09-11 DIAGNOSIS — M6281 Muscle weakness (generalized): Secondary | ICD-10-CM | POA: Diagnosis not present

## 2022-09-11 DIAGNOSIS — W19XXXA Unspecified fall, initial encounter: Secondary | ICD-10-CM | POA: Diagnosis not present

## 2022-09-11 DIAGNOSIS — G9341 Metabolic encephalopathy: Secondary | ICD-10-CM | POA: Diagnosis not present

## 2022-09-11 DIAGNOSIS — N390 Urinary tract infection, site not specified: Secondary | ICD-10-CM | POA: Diagnosis not present

## 2022-09-11 DIAGNOSIS — B961 Klebsiella pneumoniae [K. pneumoniae] as the cause of diseases classified elsewhere: Secondary | ICD-10-CM | POA: Diagnosis not present

## 2022-09-11 DIAGNOSIS — G301 Alzheimer's disease with late onset: Secondary | ICD-10-CM

## 2022-09-11 DIAGNOSIS — F17210 Nicotine dependence, cigarettes, uncomplicated: Secondary | ICD-10-CM | POA: Diagnosis not present

## 2022-09-11 DIAGNOSIS — J189 Pneumonia, unspecified organism: Secondary | ICD-10-CM | POA: Diagnosis not present

## 2022-09-17 ENCOUNTER — Ambulatory Visit (INDEPENDENT_AMBULATORY_CARE_PROVIDER_SITE_OTHER): Payer: Medicare PPO | Admitting: Family Medicine

## 2022-09-17 VITALS — BP 130/82 | HR 96 | Temp 96.0°F | Resp 18 | Ht 59.0 in | Wt 99.0 lb

## 2022-09-17 DIAGNOSIS — I10 Essential (primary) hypertension: Secondary | ICD-10-CM

## 2022-09-17 DIAGNOSIS — F03A4 Unspecified dementia, mild, with anxiety: Secondary | ICD-10-CM

## 2022-09-17 DIAGNOSIS — D751 Secondary polycythemia: Secondary | ICD-10-CM | POA: Insufficient documentation

## 2022-09-17 DIAGNOSIS — E039 Hypothyroidism, unspecified: Secondary | ICD-10-CM

## 2022-09-17 DIAGNOSIS — Z23 Encounter for immunization: Secondary | ICD-10-CM | POA: Diagnosis not present

## 2022-09-17 DIAGNOSIS — E876 Hypokalemia: Secondary | ICD-10-CM

## 2022-09-17 MED ORDER — MEMANTINE HCL 10 MG PO TABS
ORAL_TABLET | ORAL | 0 refills | Status: DC
Start: 2022-09-17 — End: 2022-10-14

## 2022-09-17 MED ORDER — ALPRAZOLAM 0.25 MG PO TABS: 0.1250 mg | ORAL_TABLET | Freq: Every day | ORAL | 1 refills | Status: DC | PRN

## 2022-09-17 MED ORDER — FENOFIBRATE 145 MG PO TABS: 145.0000 mg | ORAL_TABLET | Freq: Every day | ORAL | 0 refills | Status: DC

## 2022-09-17 NOTE — Progress Notes (Unsigned)
Subjective:  Patient ID: Allison Jimenez, female    DOB: [birth year]  Age: 72 y.o. MRN: [id number]  Chief Complaint  Patient presents with   Edema    HPI Patient is a 72 year old white female who presents for follow-up of weight loss and swelling.  She was seen by Dr. Cherlynn June August 24, 2022.  Dr. Faylene Kurtz recommended compression stockings, leg elevation, and continue chlorthalidone at 25 mg a day.  Patient also had some low blood pressure and was instructed to monitor home BPs. Swelling has improved.   09/05/2022: Patient underwent lithotripsy and ureteral stone retrieval which was successful. Ureteral stent on the right still. Patient to return this Thursday to have stent removed.  Calorie malnutrition: eating as much as she can.  On October 18th she is having a colonoscopy and EGD.      09/17/2022    3:07 PM 01/31/2022    2:27 PM 01/25/2022   10:26 AM 01/08/2022    2:23 PM 10/23/2021   10:33 AM  Depression screen PHQ 2/9  Decreased Interest 1 0  0 0  Down, Depressed, Hopeless 0 0 2 0 1  PHQ - 2 Score 1 0 2 0 1  Altered sleeping 1  0  1  Tired, decreased energy 1  1  1   Change in appetite   0  0  Feeling bad or failure about yourself  0  0  0  Trouble concentrating 0  0  0  Moving slowly or fidgety/restless 0  0  0  Suicidal thoughts 0  0  0  PHQ-9 Score 3  3  3   Difficult doing work/chores Not difficult at all  Not difficult at all  Not difficult at all        05/22/2022   11:54 AM  Fall Risk   Falls in the past year? 1  Number falls in past yr: 1  Injury with Fall? 0  Risk for fall due to : History of fall(s);Impaired balance/gait;Impaired mobility  Follow up Falls evaluation completed    Patient Care Team: Blane Ohara, MD as PCP - General (Family Medicine) Bevelyn Ngo, NP as Nurse Practitioner (Pulmonary Disease) Quinn Plowman, PA-C as Physician Assistant (General Practice) Misenheimer, Marcial Pacas, MD as Consulting Physician (Gastroenterology) Sinda Du, MD as  Consulting Physician (Ophthalmology) Florene Glen (Physician Assistant) Omar Person, MD (Inactive) as Consulting Physician (Pulmonary Disease)   Review of Systems  Current Outpatient Medications on File Prior to Visit  Medication Sig Dispense Refill   acetaminophen (TYLENOL) 500 MG tablet Take 500-1,000 mg by mouth every 6 (six) hours as needed for mild pain, moderate pain or headache.     albuterol (VENTOLIN HFA) 108 (90 Base) MCG/ACT inhaler Inhale 2 puffs into the lungs every 6 (six) hours as needed for shortness of breath or wheezing. 8 g 2   atorvastatin (LIPITOR) 20 MG tablet TAKE 1 TABLET(20 MG) BY MOUTH DAILY 90 tablet 1   calcitonin, salmon, (MIACALCIN/FORTICAL) 200 UNIT/ACT nasal spray Place 1 spray into alternate nostrils daily.     Carboxymethylcellul-Glycerin (LUBRICATING EYE DROPS OP) Place 1 drop into both eyes daily.     carvedilol (COREG) 25 MG tablet TAKE 1 TABLET(25 MG) BY MOUTH TWICE DAILY WITH A MEAL 180 tablet 0   cetirizine (ZYRTEC) 10 MG tablet Take 10 mg by mouth daily as needed for allergies.     chlorthalidone (HYGROTON) 25 MG tablet Take 25 mg by mouth daily.     Cholecalciferol (VITAMIN  D) 50 MCG (2000 UT) CAPS Take 2,000 Units by mouth daily.     conjugated estrogens (PREMARIN) vaginal cream Place 1 applicator vaginally daily as needed (dryness / burning).     cyclobenzaprine (FLEXERIL) 10 MG tablet Take 1 tablet (10 mg total) by mouth 3 (three) times daily as needed for muscle spasms. 30 tablet 2   diclofenac Sodium (VOLTAREN) 1 % GEL Apply 1 Application topically 4 (four) times daily as needed (pain).     Erenumab-aooe (AIMOVIG) 140 MG/ML SOAJ Inject 140 mg as directed every 30 (thirty) days.     famotidine (PEPCID) 40 MG tablet TAKE 1 TABLET(40 MG) BY MOUTH AT BEDTIME (Patient taking differently: Take 40 mg by mouth at bedtime.) 90 tablet 3   FLUoxetine (PROZAC) 20 MG capsule Take 2 capsules (40 mg total) by mouth daily.     fluticasone  (FLONASE) 50 MCG/ACT nasal spray SHAKE LIQUID AND USE 2 SPRAYS IN EACH NOSTRIL DAILY (Patient taking differently: Place 2 sprays into both nostrils daily as needed for allergies.) 16 g 6   Fluticasone-Umeclidin-Vilant (TRELEGY ELLIPTA) 100-62.5-25 MCG/ACT AEPB Inhale 1 puff into the lungs daily. 3 each 3   hyoscyamine (LEVSIN/SL) 0.125 MG SL tablet Place 1 tablet (0.125 mg total) under the tongue every 6 (six) hours as needed. 30 tablet 0   levothyroxine (SYNTHROID) 25 MCG tablet Take 1 tablet (25 mcg total) by mouth daily. 90 tablet 0   linaclotide (LINZESS) 145 MCG CAPS capsule Take 1 capsule (145 mcg total) by mouth daily as needed (constipation).     montelukast (SINGULAIR) 10 MG tablet Take 10 mg by mouth at bedtime.     OVER THE COUNTER MEDICATION Take 1 tablet by mouth in the morning. Salt Supplement     oxyCODONE-acetaminophen (PERCOCET) 10-325 MG tablet Take 1 tablet by mouth 4 (four) times daily as needed for pain.     pantoprazole (PROTONIX) 40 MG tablet TAKE 1 TABLET(40 MG) BY MOUTH DAILY 90 tablet 0   pregabalin (LYRICA) 25 MG capsule Take 25 mg by mouth 3 (three) times daily.     pregabalin (LYRICA) 50 MG capsule Take 50 mg by mouth 2 (two) times daily as needed (anxiety).     QUEtiapine (SEROQUEL) 25 MG tablet Take 1 tablet (25 mg total) by mouth 2 (two) times daily as needed for up to 7 days. (Patient not taking: Reported on 08/23/2022) 14 tablet 0   sennosides-docusate sodium (SENOKOT-S) 8.6-50 MG tablet Take 1 tablet by mouth daily. 30 tablet 3   sulfamethoxazole-trimethoprim (BACTRIM DS) 800-160 MG tablet Take 1 tablet by mouth 2 (two) times daily. 14 tablet 0   vitamin B-12 (CYANOCOBALAMIN) 500 MCG tablet Take 500 mcg by mouth daily at 12 noon.     No current facility-administered medications on file prior to visit.   Past Medical History:  Diagnosis Date   Acute blood loss anemia (ABLA) 09/05/2020   Allergy    ANXIETY 06/05/2006   Arthritis    SHOULDERS    BACK PAIN  01/05/2010   Cataract    bilateral, left worse   Centrilobular emphysema (HCC)    COPD (chronic obstructive pulmonary disease) (HCC)    Depression    DIVERTICULOSIS, COLON 06/05/2006   DIZZINESS OR VERTIGO 06/05/2006   positional   GERD (gastroesophageal reflux disease)    Hearing loss in left ear    no hearing aid   History of blood transfusion 08/2020   History of kidney stones    passed stones  HYPERLIPIDEMIA 06/05/2006   Hypertension    HYPOKALEMIA 12/14/2008   Hypothyroidism    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   Qualifier: Diagnosis of  By: Amador Cunas  MD, Janett Labella    Pneumonia    TOBACCO USER 11/25/2007   Past Surgical History:  Procedure Laterality Date   APPENDECTOMY     bilateral cataract surgery      BRONCHIAL BIOPSY  09/28/2021   Procedure: BRONCHIAL BIOPSIES;  Surgeon: Omar Person, MD;  Location: [facility] ENDOSCOPY;  Service: Pulmonary;;   BRONCHIAL NEEDLE ASPIRATION BIOPSY  09/28/2021   Procedure: BRONCHIAL NEEDLE ASPIRATION BIOPSIES;  Surgeon: Omar Person, MD;  Location: [facility] ENDOSCOPY;  Service: Pulmonary;;   BRONCHIAL WASHINGS  09/28/2021   Procedure: BRONCHIAL WASHINGS;  Surgeon: Omar Person, MD;  Location: [facility] ENDOSCOPY;  Service: Pulmonary;;   CESAREAN SECTION     x1   CHOLECYSTECTOMY     COLONOSCOPY     COSMETIC SURGERY     CYSTOSCOPY W/ URETERAL STENT PLACEMENT Right 07/29/2022   Procedure: CYSTOSCOPY WITH RETROGRADE PYELOGRAM/URETERAL STENT PLACEMENT;  Surgeon: Malen Gauze, MD;  Location: [facility] ORS;  Service: Urology;  Laterality: Right;   CYSTOSCOPY/URETEROSCOPY/HOLMIUM LASER/STENT PLACEMENT Right 09/05/2022   Procedure: RIGHT URETEROSCOPY/HOLMIUM LASER/STENT PLACEMENT;  Surgeon: Despina Arias, MD;  Location: [facility] ORS;  Service: Urology;  Laterality: Right;   mastoid surgery  1992   shunt in mastoid- endolymphatic sac in left ear    MRI  07/11/2020   x several, last one located in CE   TUBAL LIGATION     UPPER GI ENDOSCOPY     vocal  cord surgery     nodule x2    Family History  Problem Relation Age of Onset   Breast cancer Mother    Migraines Other    Depression Other    Anxiety disorder Other    High Cholesterol Other    Colon cancer Neg Hx    Rectal cancer Neg Hx    Stomach cancer Neg Hx    Colon polyps Neg Hx    Esophageal cancer Neg Hx    Social History   Socioeconomic History   Marital status: Divorced    Spouse name: Not on file   Number of children: 1   Years of education: 13   Highest education level: Not on file  Occupational History    Employer: [employer]    Comment: Retired  Tobacco Use   Smoking status: Every Day    Current packs/day: 0.50    Average packs/day: 0.5 packs/day for 49.0 years (24.5 ttl pk-yrs)    Types: Cigarettes   Smokeless tobacco: Never   Tobacco comments:    3/4 ppd 08/30/21 Vernie Murders, CMA  Vaping Use   Vaping status: Former   Start date: 01/01/2010   Quit date: 08/01/2021   Substances: Nicotine   Devices: menthol/fruit flavor  Substance and Sexual Activity   Alcohol use: No    Alcohol/week: 0.0 standard drinks of alcohol   Drug use: No   Sexual activity: Not Currently    Birth control/protection: Post-menopausal  Other Topics Concern   Not on file  Social History Narrative   Patient lives at home alone. Patient has a Biochemist, clinical. Patient is divorced .   Patient is retired.   Education high school.   Right handed.   Caffeine none.   Social Determinants of Health   Financial Resource Strain: Not on file  Food Insecurity: No Food Insecurity (08/01/2022)   Hunger  Vital Sign    Worried About Programme researcher, broadcasting/film/video in the Last Year: Never true    Ran Out of Food in the Last Year: Never true  Transportation Needs: No Transportation Needs (08/01/2022)   PRAPARE - Administrator, Civil Service (Medical): No    Lack of Transportation (Non-Medical): No  Physical Activity: Not on file  Stress: Not on file  Social Connections:  Unknown (05/12/2021)   Received from [facility]   Social Network    Social Network: Not on file    Objective:  BP 130/82   Pulse 96   Temp (!) 96 F (35.6 C)   Resp 18   Ht 4\' 11"  (1.499 m)   Wt 99 lb (44.9 kg)   BMI 20.00 kg/m      09/17/2022    3:35 PM 09/17/2022    2:49 PM 09/05/2022   10:15 AM  BP/Weight  Systolic BP 130 140 160  Diastolic BP 82 86 85  Wt. (Lbs)  99   BMI  20 kg/m2     Physical Exam Vitals reviewed.  Constitutional:      Appearance: Normal appearance.  Neck:     Vascular: No carotid bruit.  Cardiovascular:     Rate and Rhythm: Normal rate and regular rhythm.     Heart sounds: Normal heart sounds.  Pulmonary:     Effort: Pulmonary effort is normal.     Breath sounds: Normal breath sounds.  Abdominal:     Palpations: Abdomen is soft.     Tenderness: There is no abdominal tenderness.  Neurological:     Mental Status: She is alert.  Psychiatric:        Mood and Affect: Mood normal.        Behavior: Behavior normal.     Diabetic Foot Exam - Simple   Simple Foot Form Diabetic Foot exam was performed with the following findings: Yes 09/17/2022  3:12 PM  Visual Inspection No deformities, no ulcerations, no other skin breakdown bilaterally: Yes Sensation Testing Intact to touch and monofilament testing bilaterally: Yes Pulse Check Posterior Tibialis and Dorsalis pulse intact bilaterally: Yes Comments      Lab Results  Component Value Date   WBC 12.8 (H) 09/17/2022   HGB 10.5 (L) 09/17/2022   HCT 33.1 (L) 09/17/2022   PLT 576 (H) 09/17/2022   GLUCOSE 124 (H) 09/17/2022   CHOL 98 (L) 08/08/2022   TRIG 203 (H) 08/08/2022   HDL 41 08/08/2022   LDLDIRECT 151.9 09/13/2011   LDLCALC 25 08/08/2022   ALT 9 09/17/2022   AST 10 09/17/2022   NA 136 09/17/2022   K 4.1 09/17/2022   CL 99 09/17/2022   CREATININE 0.55 (L) 09/17/2022   BUN 15 09/17/2022   CO2 23 09/17/2022   TSH 2.640 09/17/2022   INR 1.1 07/29/2022    HGBA1C 5.6 08/08/2022      Assessment & Plan:    Hypokalemia  Essential hypertension -     Comprehensive metabolic panel  Polycythemia -     CBC with Differential/Platelet -     Ambulatory referral to Hematology / Oncology  Hypothyroidism (acquired) -     T4, free -     TSH  Mild dementia with anxiety, unspecified dementia type (HCC) -     ALPRAZolam; Take 0.5-1 tablets (0.125-0.25 mg total) by mouth daily as needed for anxiety or sleep.  Dispense: 30 tablet; Refill: 1 -     Memantine HCl;  Take 1 tablet (10 mg total) by mouth daily for 14 days, THEN 1 tablet (10 mg total) 2 (two) times daily for 16 days.  Dispense: 46 tablet; Refill: 0  Encounter for immunization -     Flu Vaccine Trivalent High Dose (Fluad) -     Pfizer Comirnaty Covid-19 Vaccine 108yrs & older  Other orders -     Fenofibrate; Take 1 tablet (145 mg total) by mouth daily.  Dispense: 90 tablet; Refill: 0     Meds ordered this encounter  Medications   ALPRAZolam (XANAX) 0.25 MG tablet    Sig: Take 0.5-1 tablets (0.125-0.25 mg total) by mouth daily as needed for anxiety or sleep.    Dispense:  30 tablet    Refill:  1   memantine (NAMENDA) 10 MG tablet    Sig: Take 1 tablet (10 mg total) by mouth daily for 14 days, THEN 1 tablet (10 mg total) 2 (two) times daily for 16 days.    Dispense:  46 tablet    Refill:  0   fenofibrate (TRICOR) 145 MG tablet    Sig: Take 1 tablet (145 mg total) by mouth daily.    Dispense:  90 tablet    Refill:  0    Orders Placed This Encounter  Procedures   Flu Vaccine Trivalent High Dose (Fluad)   Pfizer Comirnaty Covid-19 Vaccine 52yrs & older   Comprehensive metabolic panel   CBC with Differential/Platelet   T4, free   TSH   Ambulatory referral to Hematology / Oncology     Follow-up: Return in about 8 weeks (around 11/12/2022) for chronic fasting.   I,Arryanna Holquin,acting as a Neurosurgeon for Blane Ohara, MD.,have documented all relevant documentation on the behalf of  Blane Ohara, MD,as directed by  Blane Ohara, MD while in the presence of Blane Ohara, MD.   Clayborn Bigness I Leal-Borjas,acting as a scribe for Blane Ohara, MD.,have documented all relevant documentation on the behalf of Blane Ohara, MD,as directed by  Blane Ohara, MD while in the presence of Blane Ohara, MD.    An After Visit Summary was printed and given to the patient.  Blane Ohara, MD [facility] [phone]

## 2022-09-17 NOTE — Patient Instructions (Addendum)
Elevated triglycerides: Start fenofibrate 160 mg daily.  Dementia: Increase Namenda to 5 mg twice daily for 2 weeks, then increase to 10 mg twice daily.  Referring to hematology.  Discuss bactrim DS continuation with urology as this is often used to prevent urinary tract infections.

## 2022-09-18 ENCOUNTER — Telehealth: Payer: Self-pay

## 2022-09-18 LAB — CBC WITH DIFFERENTIAL/PLATELET
Basophils Absolute: 0.1 10*3/uL (ref 0.0–0.2)
Basos: 1 %
EOS (ABSOLUTE): 0.4 10*3/uL (ref 0.0–0.4)
Eos: 3 %
Hematocrit: 33.1 % — ABNORMAL LOW (ref 34.0–46.6)
Hemoglobin: 10.5 g/dL — ABNORMAL LOW (ref 11.1–15.9)
Immature Grans (Abs): 0.1 10*3/uL (ref 0.0–0.1)
Immature Granulocytes: 1 %
Lymphocytes Absolute: 3.5 10*3/uL — ABNORMAL HIGH (ref 0.7–3.1)
Lymphs: 28 %
MCH: 27.8 pg (ref 26.6–33.0)
MCHC: 31.7 g/dL (ref 31.5–35.7)
MCV: 88 fL (ref 79–97)
Monocytes Absolute: 0.8 10*3/uL (ref 0.1–0.9)
Monocytes: 6 %
Neutrophils Absolute: 7.9 10*3/uL — ABNORMAL HIGH (ref 1.4–7.0)
Neutrophils: 61 %
Platelets: 576 10*3/uL — ABNORMAL HIGH (ref 150–450)
RBC: 3.78 x10E6/uL (ref 3.77–5.28)
RDW: 14.6 % (ref 11.7–15.4)
WBC: 12.8 10*3/uL — ABNORMAL HIGH (ref 3.4–10.8)

## 2022-09-18 LAB — COMPREHENSIVE METABOLIC PANEL
ALT: 9 IU/L (ref 0–32)
AST: 10 IU/L (ref 0–40)
Albumin: 3.6 g/dL — ABNORMAL LOW (ref 3.9–4.9)
Alkaline Phosphatase: 139 IU/L — ABNORMAL HIGH (ref 44–121)
BUN/Creatinine Ratio: 27 (ref 12–28)
BUN: 15 mg/dL (ref 8–27)
Bilirubin Total: 0.3 mg/dL (ref 0.0–1.2)
CO2: 23 mmol/L (ref 20–29)
Calcium: 9.8 mg/dL (ref 8.7–10.3)
Chloride: 99 mmol/L (ref 96–106)
Creatinine, Ser: 0.55 mg/dL — ABNORMAL LOW (ref 0.57–1.00)
Globulin, Total: 2.7 g/dL (ref 1.5–4.5)
Glucose: 124 mg/dL — ABNORMAL HIGH (ref 70–99)
Potassium: 4.1 mmol/L (ref 3.5–5.2)
Sodium: 136 mmol/L (ref 134–144)
Total Protein: 6.3 g/dL (ref 6.0–8.5)
eGFR: 99 mL/min/{1.73_m2} (ref >59)

## 2022-09-18 LAB — TSH: TSH: 2.64 u[IU]/mL (ref 0.450–4.500)

## 2022-09-18 LAB — T4, FREE: Free T4: 1.07 ng/dL (ref 0.82–1.77)

## 2022-09-18 NOTE — Telephone Encounter (Signed)
Verbal was given to Charice from [facility] for once a week for 2 weeks of PT for patient.

## 2022-09-20 ENCOUNTER — Other Ambulatory Visit: Payer: Self-pay

## 2022-09-20 DIAGNOSIS — N201 Calculus of ureter: Secondary | ICD-10-CM | POA: Diagnosis not present

## 2022-09-20 MED ORDER — PROMETHAZINE HCL 25 MG PO TABS: ORAL_TABLET | ORAL | 0 refills | Status: DC

## 2022-09-20 MED ORDER — POTASSIUM CHLORIDE ER 10 MEQ PO CPCR: 20.0000 meq | ORAL_CAPSULE | Freq: Two times a day (BID) | ORAL | 1 refills | Status: DC

## 2022-09-22 DIAGNOSIS — F03A4 Unspecified dementia, mild, with anxiety: Secondary | ICD-10-CM | POA: Insufficient documentation

## 2022-09-22 NOTE — Assessment & Plan Note (Signed)
Start [employer]

## 2022-09-22 NOTE — Assessment & Plan Note (Signed)
Referring to Hematologist

## 2022-09-22 NOTE — Assessment & Plan Note (Signed)
Check labs 

## 2022-09-22 NOTE — Assessment & Plan Note (Signed)
No changes to medicines. chlorthalidone 25 mg daily and carvedilol 12.5 mg one twice daily. Continue to work on eating a healthy diet and exercise.  Labs drawn today.

## 2022-09-23 ENCOUNTER — Encounter: Payer: Self-pay | Admitting: Family Medicine

## 2022-09-24 ENCOUNTER — Other Ambulatory Visit: Payer: Self-pay | Admitting: Family Medicine

## 2022-09-25 ENCOUNTER — Other Ambulatory Visit: Payer: Self-pay | Admitting: Family Medicine

## 2022-09-25 DIAGNOSIS — F411 Generalized anxiety disorder: Secondary | ICD-10-CM

## 2022-09-26 DIAGNOSIS — B961 Klebsiella pneumoniae [K. pneumoniae] as the cause of diseases classified elsewhere: Secondary | ICD-10-CM | POA: Diagnosis not present

## 2022-09-26 DIAGNOSIS — N390 Urinary tract infection, site not specified: Secondary | ICD-10-CM | POA: Diagnosis not present

## 2022-09-26 DIAGNOSIS — J449 Chronic obstructive pulmonary disease, unspecified: Secondary | ICD-10-CM | POA: Diagnosis not present

## 2022-09-26 DIAGNOSIS — M6281 Muscle weakness (generalized): Secondary | ICD-10-CM | POA: Diagnosis not present

## 2022-09-26 DIAGNOSIS — J440 Chronic obstructive pulmonary disease with acute lower respiratory infection: Secondary | ICD-10-CM | POA: Diagnosis not present

## 2022-09-26 DIAGNOSIS — F17210 Nicotine dependence, cigarettes, uncomplicated: Secondary | ICD-10-CM | POA: Diagnosis not present

## 2022-09-26 DIAGNOSIS — J189 Pneumonia, unspecified organism: Secondary | ICD-10-CM | POA: Diagnosis not present

## 2022-09-26 DIAGNOSIS — G9341 Metabolic encephalopathy: Secondary | ICD-10-CM | POA: Diagnosis not present

## 2022-09-26 DIAGNOSIS — W19XXXA Unspecified fall, initial encounter: Secondary | ICD-10-CM | POA: Diagnosis not present

## 2022-09-26 NOTE — Progress Notes (Signed)
[facility]  [address] [place],  [place]  [address] [phone]  Clinic Day:  09/27/2022  Referring physician: Blane Ohara, MD   HISTORY OF PRESENT ILLNESS:  The patient is a 72 y.o. female  who I was asked to consult upon for an abnormal CBC.  Recent labs showed a low hemoglobin of 10.5, but elevated white cells and platelets at 12.8 and 576, respectively.  With respect to her anemia, she denies having any recently overt blood loss.  She had a bleeding ulcer 2 years ago, for which she needed 2 units of blood.  She previously took multiple aspirins daily, which led to that EGD being done.  She no longer uses aspirin or NSAIDs.  She last had a colonoscopy in 2018; she is scheduled for both an EGD and colonoscopy in October 2024.  With respect to her thrombocythemia, she denies having undergone a splenectomy.  She also denies having any acute trauma or recent surgery .    PAST MEDICAL HISTORY:   Past Medical History:  Diagnosis Date   Acute blood loss anemia (ABLA) 09/05/2020   Allergy    ANXIETY 06/05/2006   Arthritis    SHOULDERS    BACK PAIN 01/05/2010   Cataract    bilateral, left worse   Centrilobular emphysema (HCC)    COPD (chronic obstructive pulmonary disease) (HCC)    Depression    DIVERTICULOSIS, COLON 06/05/2006   DIZZINESS OR VERTIGO 06/05/2006   positional   GERD (gastroesophageal reflux disease)    Hearing loss in left ear    no hearing aid   History of blood transfusion 08/2020   History of kidney stones    passed stones   HYPERLIPIDEMIA 06/05/2006   Hypertension    HYPOKALEMIA 12/14/2008   Hypothyroidism    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   Qualifier: Diagnosis of  By: Amador Cunas  MD, Janett Labella    Pneumonia    TOBACCO USER 11/25/2007    PAST SURGICAL HISTORY:   Past Surgical History:  Procedure Laterality Date   APPENDECTOMY     bilateral cataract surgery      BRONCHIAL BIOPSY  09/28/2021   Procedure: BRONCHIAL  BIOPSIES;  Surgeon: Omar Person, MD;  Location: [facility] ENDOSCOPY;  Service: Pulmonary;;   BRONCHIAL NEEDLE ASPIRATION BIOPSY  09/28/2021   Procedure: BRONCHIAL NEEDLE ASPIRATION BIOPSIES;  Surgeon: Omar Person, MD;  Location: [facility] ENDOSCOPY;  Service: Pulmonary;;   BRONCHIAL WASHINGS  09/28/2021   Procedure: BRONCHIAL WASHINGS;  Surgeon: Omar Person, MD;  Location: [facility] ENDOSCOPY;  Service: Pulmonary;;   CESAREAN SECTION     x1   CHOLECYSTECTOMY     COLONOSCOPY     COSMETIC SURGERY     CYSTOSCOPY W/ URETERAL STENT PLACEMENT Right 07/29/2022   Procedure: CYSTOSCOPY WITH RETROGRADE PYELOGRAM/URETERAL STENT PLACEMENT;  Surgeon: Malen Gauze, MD;  Location: [facility] ORS;  Service: Urology;  Laterality: Right;   CYSTOSCOPY/URETEROSCOPY/HOLMIUM LASER/STENT PLACEMENT Right 09/05/2022   Procedure: RIGHT URETEROSCOPY/HOLMIUM LASER/STENT PLACEMENT;  Surgeon: Despina Arias, MD;  Location: [facility] ORS;  Service: Urology;  Laterality: Right;   mastoid surgery  1992   shunt in mastoid- endolymphatic sac in left ear    MRI  07/11/2020   x several, last one located in CE   TUBAL LIGATION     UPPER GI ENDOSCOPY     vocal cord surgery     nodule x2    CURRENT MEDICATIONS:   Current Outpatient Medications  Medication Sig Dispense  Refill   rizatriptan (MAXALT) 10 MG tablet Take by mouth.     acetaminophen (TYLENOL) 500 MG tablet Take 500-1,000 mg by mouth every 6 (six) hours as needed for mild pain, moderate pain or headache.     albuterol (VENTOLIN HFA) 108 (90 Base) MCG/ACT inhaler Inhale 2 puffs into the lungs every 6 (six) hours as needed for shortness of breath or wheezing. 8 g 2   ALPRAZolam (XANAX) 0.25 MG tablet Take 0.5-1 tablets (0.125-0.25 mg total) by mouth daily as needed for anxiety or sleep. 30 tablet 1   atorvastatin (LIPITOR) 20 MG tablet TAKE 1 TABLET(20 MG) BY MOUTH DAILY 90 tablet 1   calcitonin, salmon, (MIACALCIN/FORTICAL) 200 UNIT/ACT nasal spray Place 1 spray into  alternate nostrils daily.     Carboxymethylcellul-Glycerin (LUBRICATING EYE DROPS OP) Place 1 drop into both eyes daily.     carvedilol (COREG) 25 MG tablet TAKE 1 TABLET(25 MG) BY MOUTH TWICE DAILY WITH A MEAL 180 tablet 0   cetirizine (ZYRTEC) 10 MG tablet Take 10 mg by mouth daily as needed for allergies.     chlorthalidone (HYGROTON) 25 MG tablet Take 25 mg by mouth daily.     Cholecalciferol (VITAMIN D) 50 MCG (2000 UT) CAPS Take 2,000 Units by mouth daily.     conjugated estrogens (PREMARIN) vaginal cream Place 1 applicator vaginally daily as needed (dryness / burning).     cyclobenzaprine (FLEXERIL) 10 MG tablet Take 1 tablet (10 mg total) by mouth 3 (three) times daily as needed for muscle spasms. 30 tablet 2   diclofenac Sodium (VOLTAREN) 1 % GEL Apply 1 Application topically 4 (four) times daily as needed (pain).     Erenumab-aooe (AIMOVIG) 140 MG/ML SOAJ Inject 140 mg as directed every 30 (thirty) days.     famotidine (PEPCID) 40 MG tablet TAKE 1 TABLET(40 MG) BY MOUTH AT BEDTIME (Patient taking differently: Take 40 mg by mouth at bedtime.) 90 tablet 3   fenofibrate (TRICOR) 145 MG tablet Take 1 tablet (145 mg total) by mouth daily. 90 tablet 0   FLUoxetine (PROZAC) 20 MG capsule TAKE 2 CAPSULES(40 MG) BY MOUTH DAILY 180 capsule 0   fluticasone (FLONASE) 50 MCG/ACT nasal spray SHAKE LIQUID AND USE 2 SPRAYS IN EACH NOSTRIL DAILY (Patient taking differently: Place 2 sprays into both nostrils daily as needed for allergies.) 16 g 6   Fluticasone-Umeclidin-Vilant (TRELEGY ELLIPTA) 100-62.5-25 MCG/ACT AEPB Inhale 1 puff into the lungs daily. 3 each 3   hyoscyamine (LEVSIN/SL) 0.125 MG SL tablet Place 1 tablet (0.125 mg total) under the tongue every 6 (six) hours as needed. 30 tablet 0   levothyroxine (SYNTHROID) 25 MCG tablet Take 1 tablet (25 mcg total) by mouth daily. 90 tablet 0   linaclotide (LINZESS) 145 MCG CAPS capsule Take 1 capsule (145 mcg total) by mouth daily as needed  (constipation).     memantine (NAMENDA) 10 MG tablet Take 1 tablet (10 mg total) by mouth daily for 14 days, THEN 1 tablet (10 mg total) 2 (two) times daily for 16 days. 46 tablet 0   montelukast (SINGULAIR) 10 MG tablet Take 10 mg by mouth at bedtime.     OVER THE COUNTER MEDICATION Take 1 tablet by mouth in the morning. Salt Supplement     oxyCODONE-acetaminophen (PERCOCET) 10-325 MG tablet Take 1 tablet by mouth 4 (four) times daily as needed for pain.     pantoprazole (PROTONIX) 40 MG tablet TAKE 1 TABLET(40 MG) BY MOUTH DAILY 90 tablet 0  potassium chloride (MICRO-K) 10 MEQ CR capsule Take 2 capsules (20 mEq total) by mouth 2 (two) times daily. 360 capsule 1   pregabalin (LYRICA) 25 MG capsule Take 25 mg by mouth 3 (three) times daily.     pregabalin (LYRICA) 50 MG capsule Take 50 mg by mouth 2 (two) times daily as needed (anxiety).     promethazine (PHENERGAN) 25 MG tablet TAKE 1 TABLET BY MOUTH EVERY 8 HOURS IF NEEDED 20 tablet 0   QUEtiapine (SEROQUEL) 25 MG tablet Take 1 tablet (25 mg total) by mouth 2 (two) times daily as needed for up to 7 days. (Patient not taking: Reported on 08/23/2022) 14 tablet 0   sennosides-docusate sodium (SENOKOT-S) 8.6-50 MG tablet Take 1 tablet by mouth daily. 30 tablet 3   sulfamethoxazole-trimethoprim (BACTRIM DS) 800-160 MG tablet Take 1 tablet by mouth 2 (two) times daily. 14 tablet 0   vitamin B-12 (CYANOCOBALAMIN) 500 MCG tablet Take 500 mcg by mouth daily at 12 noon.     No current facility-administered medications for this visit.    ALLERGIES:   Allergies  Allergen Reactions   Erythromycin Base Diarrhea   Vfend [Voriconazole] Other (See Comments)    Visual changes   Penicillins Rash    FAMILY HISTORY:   Family History  Problem Relation Age of Onset   Breast cancer Mother    Dementia Mother    Heart disease Mother    Hypertension Mother    Thyroid disease Mother    Alzheimer's disease Father    Heart disease Father    Hypertension  Father    Thyroid disease Father    Diabetes Sister    Colon cancer Neg Hx    Rectal cancer Neg Hx    Stomach cancer Neg Hx    Colon polyps Neg Hx    Esophageal cancer Neg Hx     SOCIAL HISTORY:  The patient was born in [place].  She lives in town.  She is twice divorced, with 1 child.  She did previous clerical work.  She has smoked as much as 1-2 packs of cigarettes daily for the past 50 years.  She denies alcohol abuse.    REVIEW OF SYSTEMS:  Review of Systems  Constitutional:  Positive for unexpected weight change. Negative for fatigue and fever.  HENT:   Positive for hearing loss. Negative for sore throat.   Eyes:  Negative for eye problems.  Respiratory:  Positive for shortness of breath. Negative for chest tightness, cough and hemoptysis.   Cardiovascular:  Negative for chest pain and palpitations.  Gastrointestinal:  Positive for constipation. Negative for abdominal distention, abdominal pain, blood in stool, diarrhea, nausea and vomiting.  Endocrine: Negative for hot flashes.  Genitourinary:  Negative for difficulty urinating, dysuria, frequency, hematuria and nocturia.   Musculoskeletal:  Positive for arthralgias, back pain and gait problem. Negative for myalgias.  Skin: Negative.  Negative for itching and rash.  Neurological:  Positive for gait problem and headaches. Negative for dizziness, extremity weakness, light-headedness and numbness.  Hematological: Negative.   Psychiatric/Behavioral:  Positive for depression. Negative for suicidal ideas. The patient is not nervous/anxious.     PHYSICAL EXAM:  Blood pressure 118/76, pulse 78, temperature 98.1 F (36.7 C), resp. rate 14, height 4\' 11"  (1.499 m), weight 102 lb 1.6 oz (46.3 kg), SpO2 99%. Wt Readings from Last 3 Encounters:  09/27/22 102 lb 1.6 oz (46.3 kg)  09/17/22 99 lb (44.9 kg)  09/05/22 98 lb 9.6 oz (44.7 kg)  Body mass index is 20.62 kg/m. Performance status (ECOG): 1 - Symptomatic but completely  ambulatory Physical Exam Constitutional:      Appearance: Normal appearance. She is not ill-appearing.  HENT:     Mouth/Throat:     Mouth: Mucous membranes are moist.     Pharynx: Oropharynx is clear. No oropharyngeal exudate or posterior oropharyngeal erythema.  Cardiovascular:     Rate and Rhythm: Normal rate and regular rhythm.     Heart sounds: No murmur heard.    No friction rub. No gallop.  Pulmonary:     Effort: Pulmonary effort is normal. No respiratory distress.     Breath sounds: Normal breath sounds. No wheezing, rhonchi or rales.  Abdominal:     General: Bowel sounds are normal. There is no distension.     Palpations: Abdomen is soft. There is no mass.     Tenderness: There is no abdominal tenderness.  Musculoskeletal:        General: No swelling.     Right lower leg: No edema.     Left lower leg: No edema.  Lymphadenopathy:     Cervical: No cervical adenopathy.     Upper Body:     Right upper body: No supraclavicular or axillary adenopathy.     Left upper body: No supraclavicular or axillary adenopathy.     Lower Body: No right inguinal adenopathy. No left inguinal adenopathy.  Skin:    General: Skin is warm.     Coloration: Skin is not jaundiced.     Findings: No lesion or rash.  Neurological:     General: No focal deficit present.     Mental Status: She is alert and oriented to person, place, and time. Mental status is at baseline.  Psychiatric:        Mood and Affect: Mood normal.        Behavior: Behavior normal.        Thought Content: Thought content normal.     LABS:       Latest Ref Rng & Units 09/27/2022   12:00 AM 09/17/2022    3:43 PM 08/08/2022    3:32 PM  CBC  WBC  10.0     12.8  12.0   Hemoglobin 12.0 - 16.0 10.8     10.5  10.9   Hematocrit 36 - 46 33     33.1  34.8   Platelets 150 - 400 K/uL 546     576  556      This result is from an external source.      Latest Ref Rng & Units 09/17/2022    3:43 PM 08/08/2022    3:32 PM 07/31/2022     3:59 AM  CMP  Glucose 70 - 99 mg/dL 130  865  84   BUN 8 - 27 mg/dL 15  7  9    Creatinine 0.57 - 1.00 mg/dL 7.84  6.96  2.95   Sodium 134 - 144 mmol/L 136  138  132   Potassium 3.5 - 5.2 mmol/L 4.1  5.2  3.3   Chloride 96 - 106 mmol/L 99  105  99   CO2 20 - 29 mmol/L 23  20  20    Calcium 8.7 - 10.3 mg/dL 9.8  8.9  8.2   Total Protein 6.0 - 8.5 g/dL 6.3  5.9    Total Bilirubin 0.0 - 1.2 mg/dL 0.3  0.3    Alkaline Phos 44 - 121 IU/L 139  209  AST 0 - 40 IU/L 10  25    ALT 0 - 32 IU/L 9  15      Latest Reference Range & Units 09/27/22 15:08  Iron 28 - 170 ug/dL 45  UIBC ug/dL 213  TIBC 086 - 578 ug/dL 469  Saturation Ratios 10.4 - 31.8 % 11  Ferritin 11 - 307 ng/mL 65   ASSESSMENT & PLAN:  A 73 y.o. female who I was asked to consult upon for abnormal counts, including anemia and thrombocythemia.  With her history of GI blood loss with current anemia, thrombocythemia, and a declining MCV, I do believe she has iron deficiency anemia.  I will arrange for her to receive IV iron next week to replenish her iron stores and improve her hemoglobin.  As mentioned previously, she has a GI appointment next month, which she knows to keep.  Otherwise, I will see her back in 3 months to reassess her iron levels and peripheral counts to see how well they have improved after receiving IV iron.  The patient understands all the plans discussed today and is in agreement with them.  I do appreciate Cox, Kirsten, MD for his new consult.   [place] Kirby Funk, MD

## 2022-09-26 NOTE — Progress Notes (Incomplete)
[facility]  [address] [place],  [place]  [address] [phone]  Clinic Day:  09/26/2022  Referring physician: Blane Ohara, MD   HISTORY OF PRESENT ILLNESS:  The patient is a 72 y.o. female  *** who I was asked to consult upon for an abnormal CBC.  Recent labs showed a low hemoglobin of 10.5, but elevated white cells and platelets at 12.8 and 576, respectively.    PAST MEDICAL HISTORY:   Past Medical History:  Diagnosis Date  . Acute blood loss anemia (ABLA) 09/05/2020  . Allergy   . ANXIETY 06/05/2006  . Arthritis    SHOULDERS   . BACK PAIN 01/05/2010  . Cataract    bilateral, left worse  . Centrilobular emphysema (HCC)   . COPD (chronic obstructive pulmonary disease) (HCC)   . Depression   . DIVERTICULOSIS, COLON 06/05/2006  . DIZZINESS OR VERTIGO 06/05/2006   positional  . GERD (gastroesophageal reflux disease)   . Hearing loss in left ear    no hearing aid  . History of blood transfusion 08/2020  . History of kidney stones    passed stones  . HYPERLIPIDEMIA 06/05/2006  . Hypertension   . HYPOKALEMIA 12/14/2008  . Hypothyroidism   . 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 Pneumonia   . TOBACCO USER 11/25/2007    PAST SURGICAL HISTORY:   Past Surgical History:  Procedure Laterality Date  . APPENDECTOMY    . bilateral cataract surgery     . BRONCHIAL BIOPSY  09/28/2021   Procedure: BRONCHIAL BIOPSIES;  Surgeon: Omar Person, MD;  Location: [facility] ENDOSCOPY;  Service: Pulmonary;;  . BRONCHIAL NEEDLE ASPIRATION BIOPSY  09/28/2021   Procedure: BRONCHIAL NEEDLE ASPIRATION BIOPSIES;  Surgeon: Omar Person, MD;  Location: [facility] ENDOSCOPY;  Service: Pulmonary;;  . BRONCHIAL WASHINGS  09/28/2021   Procedure: BRONCHIAL WASHINGS;  Surgeon: Omar Person, MD;  Location: [facility] ENDOSCOPY;  Service: Pulmonary;;  . CESAREAN SECTION     x1  . CHOLECYSTECTOMY    . COLONOSCOPY     . COSMETIC SURGERY    . CYSTOSCOPY W/ URETERAL STENT PLACEMENT Right 07/29/2022   Procedure: CYSTOSCOPY WITH RETROGRADE PYELOGRAM/URETERAL STENT PLACEMENT;  Surgeon: Malen Gauze, MD;  Location: [facility] ORS;  Service: Urology;  Laterality: Right;  . CYSTOSCOPY/URETEROSCOPY/HOLMIUM LASER/STENT PLACEMENT Right 09/05/2022   Procedure: RIGHT URETEROSCOPY/HOLMIUM LASER/STENT PLACEMENT;  Surgeon: Despina Arias, MD;  Location: [facility] ORS;  Service: Urology;  Laterality: Right;  . mastoid surgery  1992   shunt in mastoid- endolymphatic sac in left ear   . MRI  07/11/2020   x several, last one located in CE  . TUBAL LIGATION    . UPPER GI ENDOSCOPY    . vocal cord surgery     nodule x2    CURRENT MEDICATIONS:   Current Outpatient Medications  Medication Sig Dispense Refill  . acetaminophen (TYLENOL) 500 MG tablet Take 500-1,000 mg by mouth every 6 (six) hours as needed for mild pain, moderate pain or headache.    . albuterol (VENTOLIN HFA) 108 (90 Base) MCG/ACT inhaler Inhale 2 puffs into the lungs every 6 (six) hours as needed for shortness of breath or wheezing. 8 g 2  . ALPRAZolam (XANAX) 0.25 MG tablet Take 0.5-1 tablets (0.125-0.25 mg total) by mouth daily as needed for anxiety or sleep. 30 tablet 1  . atorvastatin (LIPITOR) 20 MG tablet TAKE 1 TABLET(20 MG) BY  MOUTH DAILY 90 tablet 1  . calcitonin, salmon, (MIACALCIN/FORTICAL) 200 UNIT/ACT nasal spray Place 1 spray into alternate nostrils daily.    . Carboxymethylcellul-Glycerin (LUBRICATING EYE DROPS OP) Place 1 drop into both eyes daily.    . carvedilol (COREG) 25 MG tablet TAKE 1 TABLET(25 MG) BY MOUTH TWICE DAILY WITH A MEAL 180 tablet 0  . cetirizine (ZYRTEC) 10 MG tablet Take 10 mg by mouth daily as needed for allergies.    . chlorthalidone (HYGROTON) 25 MG tablet Take 25 mg by mouth daily.    . Cholecalciferol (VITAMIN D) 50 MCG (2000 UT) CAPS Take 2,000 Units by mouth daily.    Marland Kitchen conjugated estrogens (PREMARIN) vaginal cream Place  1 applicator vaginally daily as needed (dryness / burning).    . cyclobenzaprine (FLEXERIL) 10 MG tablet Take 1 tablet (10 mg total) by mouth 3 (three) times daily as needed for muscle spasms. 30 tablet 2  . diclofenac Sodium (VOLTAREN) 1 % GEL Apply 1 Application topically 4 (four) times daily as needed (pain).    Dorise Hiss (AIMOVIG) 140 MG/ML SOAJ Inject 140 mg as directed every 30 (thirty) days.    . famotidine (PEPCID) 40 MG tablet TAKE 1 TABLET(40 MG) BY MOUTH AT BEDTIME (Patient taking differently: Take 40 mg by mouth at bedtime.) 90 tablet 3  . fenofibrate (TRICOR) 145 MG tablet Take 1 tablet (145 mg total) by mouth daily. 90 tablet 0  . FLUoxetine (PROZAC) 20 MG capsule TAKE 2 CAPSULES(40 MG) BY MOUTH DAILY 180 capsule 0  . fluticasone (FLONASE) 50 MCG/ACT nasal spray SHAKE LIQUID AND USE 2 SPRAYS IN EACH NOSTRIL DAILY (Patient taking differently: Place 2 sprays into both nostrils daily as needed for allergies.) 16 g 6  . Fluticasone-Umeclidin-Vilant (TRELEGY ELLIPTA) 100-62.5-25 MCG/ACT AEPB Inhale 1 puff into the lungs daily. 3 each 3  . hyoscyamine (LEVSIN/SL) 0.125 MG SL tablet Place 1 tablet (0.125 mg total) under the tongue every 6 (six) hours as needed. 30 tablet 0  . levothyroxine (SYNTHROID) 25 MCG tablet Take 1 tablet (25 mcg total) by mouth daily. 90 tablet 0  . linaclotide (LINZESS) 145 MCG CAPS capsule Take 1 capsule (145 mcg total) by mouth daily as needed (constipation).    . memantine (NAMENDA) 10 MG tablet Take 1 tablet (10 mg total) by mouth daily for 14 days, THEN 1 tablet (10 mg total) 2 (two) times daily for 16 days. 46 tablet 0  . montelukast (SINGULAIR) 10 MG tablet Take 10 mg by mouth at bedtime.    Marland Kitchen OVER THE COUNTER MEDICATION Take 1 tablet by mouth in the morning. Salt Supplement    . oxyCODONE-acetaminophen (PERCOCET) 10-325 MG tablet Take 1 tablet by mouth 4 (four) times daily as needed for pain.    . pantoprazole (PROTONIX) 40 MG tablet TAKE 1 TABLET(40 MG)  BY MOUTH DAILY 90 tablet 0  . potassium chloride (MICRO-K) 10 MEQ CR capsule Take 2 capsules (20 mEq total) by mouth 2 (two) times daily. 360 capsule 1  . pregabalin (LYRICA) 25 MG capsule Take 25 mg by mouth 3 (three) times daily.    . pregabalin (LYRICA) 50 MG capsule Take 50 mg by mouth 2 (two) times daily as needed (anxiety).    . promethazine (PHENERGAN) 25 MG tablet TAKE 1 TABLET BY MOUTH EVERY 8 HOURS IF NEEDED 20 tablet 0  . QUEtiapine (SEROQUEL) 25 MG tablet Take 1 tablet (25 mg total) by mouth 2 (two) times daily as needed for up to 7 days. (  Patient not taking: Reported on 08/23/2022) 14 tablet 0  . sennosides-docusate sodium (SENOKOT-S) 8.6-50 MG tablet Take 1 tablet by mouth daily. 30 tablet 3  . sulfamethoxazole-trimethoprim (BACTRIM DS) 800-160 MG tablet Take 1 tablet by mouth 2 (two) times daily. 14 tablet 0  . vitamin B-12 (CYANOCOBALAMIN) 500 MCG tablet Take 500 mcg by mouth daily at 12 noon.     No current facility-administered medications for this visit.    ALLERGIES:   Allergies  Allergen Reactions  . Erythromycin Base Diarrhea  . Vfend [Voriconazole] Other (See Comments)    Visual changes  . Penicillins Rash    FAMILY HISTORY:   Family History  Problem Relation Age of Onset  . Breast cancer Mother   . Migraines Other   . Depression Other   . Anxiety disorder Other   . High Cholesterol Other   . Colon cancer Neg Hx   . Rectal cancer Neg Hx   . Stomach cancer Neg Hx   . Colon polyps Neg Hx   . Esophageal cancer Neg Hx     SOCIAL HISTORY:   reports that she has been smoking cigarettes. She has a 24.5 pack-year smoking history. She has never used smokeless tobacco. She reports that she does not drink alcohol and does not use drugs.  REVIEW OF SYSTEMS:  Review of Systems - Oncology   PHYSICAL EXAM:  There were no vitals taken for this visit. Wt Readings from Last 3 Encounters:  09/17/22 99 lb (44.9 kg)  09/05/22 98 lb 9.6 oz (44.7 kg)  08/24/22 98 lb  9.6 oz (44.7 kg)   There is no height or weight on file to calculate BMI. Performance status (ECOG): {CHL ONC [id number] Physical Exam  LABS:      Latest Ref Rng & Units 09/17/2022    3:43 PM 08/08/2022    3:32 PM 07/31/2022    3:59 AM  CBC  WBC 3.4 - 10.8 x10E3/uL 12.8  12.0  14.3   Hemoglobin 11.1 - 15.9 g/dL 95.2  84.1  32.4   Hematocrit 34.0 - 46.6 % 33.1  34.8  36.8   Platelets 150 - 450 x10E3/uL 576  556  593       Latest Ref Rng & Units 09/17/2022    3:43 PM 08/08/2022    3:32 PM 07/31/2022    3:59 AM  CMP  Glucose 70 - 99 mg/dL 401  027  84   BUN 8 - 27 mg/dL 15  7  9    Creatinine 0.57 - 1.00 mg/dL 2.53  6.64  4.03   Sodium 134 - 144 mmol/L 136  138  132   Potassium 3.5 - 5.2 mmol/L 4.1  5.2  3.3   Chloride 96 - 106 mmol/L 99  105  99   CO2 20 - 29 mmol/L 23  20  20    Calcium 8.7 - 10.3 mg/dL 9.8  8.9  8.2   Total Protein 6.0 - 8.5 g/dL 6.3  5.9    Total Bilirubin 0.0 - 1.2 mg/dL 0.3  0.3    Alkaline Phos 44 - 121 IU/L 139  209    AST 0 - 40 IU/L 10  25    ALT 0 - 32 IU/L 9  15       No results found for: "CEA1", "CEA" / No results found for: "CEA1", "CEA" No results found for: "PSA1" No results found for: "KVQ259" No results found for: "CAN125"  No results found for: "TOTALPROTELP", "ALBUMINELP", "A1GS", "  A2GS", "BETS", "BETA2SER", "GAMS", "MSPIKE", "SPEI" No results found for: "TIBC", "FERRITIN", "IRONPCTSAT" No results found for: "LDH"  No results found for: "AFPTUMOR", "TOTALPROTELP", "ALBUMINELP", "A1GS", "A2GS", "BETS", "BETA2SER", "GAMS", "MSPIKE", "SPEI", "LDH", "CEA1", "CEA", "PSA1", "IGASERUM", "IGGSERUM", "IGMSERUM", "THGAB", "THYROGLB"  Review Flowsheet        No data to display          STUDIES:  DG C-Arm 1-60 Min-No Report  Result Date: 09/05/2022 Fluoroscopy was utilized by the requesting physician.  No radiographic interpretation.     ASSESSMENT & PLAN:  A 72 y.o. female who I was asked to consult upon for *** .The patient  understands all the plans discussed today and is in agreement with them.  I do appreciate Cox, Kirsten, MD for his new consult.   Tyreck Bell Kirby Funk, MD

## 2022-09-27 ENCOUNTER — Inpatient Hospital Stay: Payer: Medicare PPO | Attending: Oncology | Admitting: Oncology

## 2022-09-27 ENCOUNTER — Encounter: Payer: Self-pay | Admitting: Oncology

## 2022-09-27 ENCOUNTER — Inpatient Hospital Stay: Payer: Medicare PPO

## 2022-09-27 ENCOUNTER — Other Ambulatory Visit: Payer: Self-pay | Admitting: Oncology

## 2022-09-27 VITALS — BP 118/76 | HR 78 | Temp 98.1°F | Resp 14 | Ht 59.0 in | Wt 102.1 lb

## 2022-09-27 DIAGNOSIS — E039 Hypothyroidism, unspecified: Secondary | ICD-10-CM | POA: Diagnosis not present

## 2022-09-27 DIAGNOSIS — Z87442 Personal history of urinary calculi: Secondary | ICD-10-CM | POA: Diagnosis not present

## 2022-09-27 DIAGNOSIS — K219 Gastro-esophageal reflux disease without esophagitis: Secondary | ICD-10-CM | POA: Diagnosis not present

## 2022-09-27 DIAGNOSIS — D75839 Thrombocytosis, unspecified: Secondary | ICD-10-CM | POA: Diagnosis not present

## 2022-09-27 DIAGNOSIS — H8109 Meniere's disease, unspecified ear: Secondary | ICD-10-CM | POA: Diagnosis not present

## 2022-09-27 DIAGNOSIS — E785 Hyperlipidemia, unspecified: Secondary | ICD-10-CM | POA: Insufficient documentation

## 2022-09-27 DIAGNOSIS — J432 Centrilobular emphysema: Secondary | ICD-10-CM | POA: Insufficient documentation

## 2022-09-27 DIAGNOSIS — R7989 Other specified abnormal findings of blood chemistry: Secondary | ICD-10-CM

## 2022-09-27 DIAGNOSIS — Z79890 Hormone replacement therapy (postmenopausal): Secondary | ICD-10-CM | POA: Diagnosis not present

## 2022-09-27 DIAGNOSIS — D509 Iron deficiency anemia, unspecified: Secondary | ICD-10-CM | POA: Insufficient documentation

## 2022-09-27 DIAGNOSIS — D500 Iron deficiency anemia secondary to blood loss (chronic): Secondary | ICD-10-CM | POA: Diagnosis not present

## 2022-09-27 DIAGNOSIS — F17210 Nicotine dependence, cigarettes, uncomplicated: Secondary | ICD-10-CM | POA: Insufficient documentation

## 2022-09-27 DIAGNOSIS — I10 Essential (primary) hypertension: Secondary | ICD-10-CM | POA: Insufficient documentation

## 2022-09-27 DIAGNOSIS — Z79899 Other long term (current) drug therapy: Secondary | ICD-10-CM | POA: Diagnosis not present

## 2022-09-27 DIAGNOSIS — Z803 Family history of malignant neoplasm of breast: Secondary | ICD-10-CM | POA: Insufficient documentation

## 2022-09-27 DIAGNOSIS — D751 Secondary polycythemia: Secondary | ICD-10-CM | POA: Diagnosis not present

## 2022-09-27 DIAGNOSIS — D649 Anemia, unspecified: Secondary | ICD-10-CM | POA: Diagnosis not present

## 2022-09-27 LAB — CBC AND DIFFERENTIAL
HCT: 33 — AB (ref 36–46)
Hemoglobin: 10.8 — AB (ref 12.0–16.0)
Neutrophils Absolute: 4.7
Platelets: 546 10*3/uL — AB (ref 150–400)
WBC: 10

## 2022-09-27 LAB — IRON AND TIBC
Iron: 45 ug/dL (ref 28–170)
Saturation Ratios: 11 % (ref 10.4–31.8)
TIBC: 393 ug/dL (ref 250–450)
UIBC: 348 ug/dL

## 2022-09-27 LAB — FERRITIN: Ferritin: 65 ng/mL (ref 11–307)

## 2022-09-27 LAB — CBC: RBC: 3.77 — AB (ref 3.87–5.11)

## 2022-09-28 ENCOUNTER — Telehealth: Payer: Self-pay | Admitting: Oncology

## 2022-09-28 NOTE — Telephone Encounter (Signed)
Contacted pt to schedule an appt. Unable to reach via phone, voicemail was left.    Scheduling Message Entered by Rennis Harding A on 09/27/2022 at  5:37 PM Priority: Routine <No visit type provided>  Department: [facility]  Provider:  Scheduling Notes:  Iv iron next week  Labs/appt 12-27-22

## 2022-09-30 ENCOUNTER — Encounter: Payer: Self-pay | Admitting: Oncology

## 2022-10-02 ENCOUNTER — Encounter: Payer: Self-pay | Admitting: Oncology

## 2022-10-02 ENCOUNTER — Other Ambulatory Visit: Payer: Self-pay | Admitting: Pharmacist

## 2022-10-03 NOTE — Telephone Encounter (Signed)
Patient has been scheduled. Aware of appt dates and times.

## 2022-10-04 ENCOUNTER — Encounter: Payer: Self-pay | Admitting: Oncology

## 2022-10-04 LAB — JAK2 V617F RFX CALR/MPL/E12-15

## 2022-10-04 LAB — CALR +MPL + E12-E15  (REFLEX)

## 2022-10-04 MED FILL — Iron Sucrose Inj 20 MG/ML (Fe Equiv): INTRAVENOUS | Qty: 10 | Status: AC

## 2022-10-05 ENCOUNTER — Inpatient Hospital Stay: Payer: Medicare PPO | Attending: Oncology

## 2022-10-05 VITALS — BP 128/64 | HR 74 | Temp 97.5°F | Resp 16

## 2022-10-05 DIAGNOSIS — D500 Iron deficiency anemia secondary to blood loss (chronic): Secondary | ICD-10-CM | POA: Diagnosis not present

## 2022-10-05 DIAGNOSIS — Z79899 Other long term (current) drug therapy: Secondary | ICD-10-CM | POA: Diagnosis not present

## 2022-10-05 MED ORDER — SODIUM CHLORIDE 0.9 % IV SOLN
200.0000 mg | Freq: Once | INTRAVENOUS | Status: AC
  Administered 2022-10-05: 200 mg via INTRAVENOUS
  Filled 2022-10-05: qty 10

## 2022-10-05 MED ORDER — SODIUM CHLORIDE 0.9 % IV SOLN: Freq: Once | INTRAVENOUS | Status: AC

## 2022-10-05 MED FILL — Iron Sucrose Inj 20 MG/ML (Fe Equiv): INTRAVENOUS | Qty: 10 | Status: AC

## 2022-10-05 NOTE — Patient Instructions (Signed)
Iron Sucrose Injection What is this medication? IRON SUCROSE (EYE ern SOO krose) treats low levels of iron (iron deficiency anemia) in people with kidney disease. Iron is a mineral that plays an important role in making red blood cells, which carry oxygen from your lungs to the rest of your body. This medicine may be used for other purposes; ask your health care provider or pharmacist if you have questions. COMMON BRAND NAME(S): Venofer What should I tell my care team before I take this medication? They need to know if you have any of these conditions: Anemia not caused by low iron levels Heart disease High levels of iron in the blood Kidney disease Liver disease An unusual or allergic reaction to iron, other medications, foods, dyes, or preservatives Pregnant or trying to get pregnant Breastfeeding How should I use this medication? This medication is for infusion into a vein. It is given in a hospital or clinic setting. Talk to your care team about the use of this medication in children. While this medication may be prescribed for children as young as 2 years for selected conditions, precautions do apply. Overdosage: If you think you have taken too much of this medicine contact a poison control center or emergency room at once. NOTE: This medicine is only for you. Do not share this medicine with others. What if I miss a dose? Keep appointments for follow-up doses. It is important not to miss your dose. Call your care team if you are unable to keep an appointment. What may interact with this medication? Do not take this medication with any of the following: Deferoxamine Dimercaprol Other iron products This medication may also interact with the following: Chloramphenicol Deferasirox This list may not describe all possible interactions. Give your health care provider a list of all the medicines, herbs, non-prescription drugs, or dietary supplements you use. Also tell them if you smoke,  drink alcohol, or use illegal drugs. Some items may interact with your medicine. What should I watch for while using this medication? Visit your care team regularly. Tell your care team if your symptoms do not start to get better or if they get worse. You may need blood work done while you are taking this medication. You may need to follow a special diet. Talk to your care team. Foods that contain iron include: whole grains/cereals, dried fruits, beans, or peas, leafy green vegetables, and organ meats (liver, kidney). What side effects may I notice from receiving this medication? Side effects that you should report to your care team as soon as possible: Allergic reactions--skin rash, itching, hives, swelling of the face, lips, tongue, or throat Low blood pressure--dizziness, feeling faint or lightheaded, blurry vision Shortness of breath Side effects that usually do not require medical attention (report to your care team if they continue or are bothersome): Flushing Headache Joint pain Muscle pain Nausea Pain, redness, or irritation at injection site This list may not describe all possible side effects. Call your doctor for medical advice about side effects. You may report side effects to FDA at 1-800-FDA-1088. Where should I keep my medication? This medication is given in a hospital or clinic. It will not be stored at home. NOTE: This sheet is a summary. It may not cover all possible information. If you have questions about this medicine, talk to your doctor, pharmacist, or health care provider.  2024 Elsevier/Gold Standard (2022-05-25 00:00:00)

## 2022-10-08 ENCOUNTER — Inpatient Hospital Stay: Payer: Medicare PPO

## 2022-10-08 ENCOUNTER — Encounter: Payer: Self-pay | Admitting: Oncology

## 2022-10-08 ENCOUNTER — Other Ambulatory Visit: Payer: Self-pay

## 2022-10-08 ENCOUNTER — Telehealth: Payer: Self-pay

## 2022-10-08 ENCOUNTER — Ambulatory Visit: Payer: Medicare PPO

## 2022-10-08 VITALS — BP 100/58 | HR 78 | Temp 97.6°F | Resp 16 | Ht 59.0 in | Wt 101.0 lb

## 2022-10-08 DIAGNOSIS — D500 Iron deficiency anemia secondary to blood loss (chronic): Secondary | ICD-10-CM

## 2022-10-08 DIAGNOSIS — Z79899 Other long term (current) drug therapy: Secondary | ICD-10-CM | POA: Diagnosis not present

## 2022-10-08 MED ORDER — PREGABALIN 25 MG PO CAPS: 25.0000 mg | ORAL_CAPSULE | Freq: Three times a day (TID) | ORAL | 2 refills | Status: DC

## 2022-10-08 MED ORDER — SODIUM CHLORIDE 0.9 % IV SOLN: Freq: Once | INTRAVENOUS | Status: AC

## 2022-10-08 MED ORDER — ATORVASTATIN CALCIUM 20 MG PO TABS: ORAL_TABLET | ORAL | 1 refills | Status: DC

## 2022-10-08 MED ORDER — SODIUM CHLORIDE 0.9 % IV SOLN
200.0000 mg | Freq: Once | INTRAVENOUS | Status: AC
  Administered 2022-10-08: 200 mg via INTRAVENOUS
  Filled 2022-10-08: qty 10

## 2022-10-08 MED FILL — Iron Sucrose Inj 20 MG/ML (Fe Equiv): INTRAVENOUS | Qty: 10 | Status: AC

## 2022-10-08 NOTE — Telephone Encounter (Signed)
Tiea called and she is unable to tolerate the fenofibrate.  The pill is much to large and she is having problems swallowing the medication.  She did talk to the pharmacy about a different dose, something that might be easier to swallow.  They did not feel that the lower doses are much easier to swallow.

## 2022-10-08 NOTE — Patient Instructions (Signed)
Iron Sucrose Injection What is this medication? IRON SUCROSE (EYE ern SOO krose) treats low levels of iron (iron deficiency anemia) in people with kidney disease. Iron is a mineral that plays an important role in making red blood cells, which carry oxygen from your lungs to the rest of your body. This medicine may be used for other purposes; ask your health care provider or pharmacist if you have questions. COMMON BRAND NAME(S): Venofer What should I tell my care team before I take this medication? They need to know if you have any of these conditions: Anemia not caused by low iron levels Heart disease High levels of iron in the blood Kidney disease Liver disease An unusual or allergic reaction to iron, other medications, foods, dyes, or preservatives Pregnant or trying to get pregnant Breastfeeding How should I use this medication? This medication is for infusion into a vein. It is given in a hospital or clinic setting. Talk to your care team about the use of this medication in children. While this medication may be prescribed for children as young as 2 years for selected conditions, precautions do apply. Overdosage: If you think you have taken too much of this medicine contact a poison control center or emergency room at once. NOTE: This medicine is only for you. Do not share this medicine with others. What if I miss a dose? Keep appointments for follow-up doses. It is important not to miss your dose. Call your care team if you are unable to keep an appointment. What may interact with this medication? Do not take this medication with any of the following: Deferoxamine Dimercaprol Other iron products This medication may also interact with the following: Chloramphenicol Deferasirox This list may not describe all possible interactions. Give your health care provider a list of all the medicines, herbs, non-prescription drugs, or dietary supplements you use. Also tell them if you smoke,  drink alcohol, or use illegal drugs. Some items may interact with your medicine. What should I watch for while using this medication? Visit your care team regularly. Tell your care team if your symptoms do not start to get better or if they get worse. You may need blood work done while you are taking this medication. You may need to follow a special diet. Talk to your care team. Foods that contain iron include: whole grains/cereals, dried fruits, beans, or peas, leafy green vegetables, and organ meats (liver, kidney). What side effects may I notice from receiving this medication? Side effects that you should report to your care team as soon as possible: Allergic reactions--skin rash, itching, hives, swelling of the face, lips, tongue, or throat Low blood pressure--dizziness, feeling faint or lightheaded, blurry vision Shortness of breath Side effects that usually do not require medical attention (report to your care team if they continue or are bothersome): Flushing Headache Joint pain Muscle pain Nausea Pain, redness, or irritation at injection site This list may not describe all possible side effects. Call your doctor for medical advice about side effects. You may report side effects to FDA at 1-800-FDA-1088. Where should I keep my medication? This medication is given in a hospital or clinic. It will not be stored at home. NOTE: This sheet is a summary. It may not cover all possible information. If you have questions about this medicine, talk to your doctor, pharmacist, or health care provider.  2024 Elsevier/Gold Standard (2022-05-25 00:00:00)

## 2022-10-09 ENCOUNTER — Ambulatory Visit: Payer: Medicare PPO

## 2022-10-09 ENCOUNTER — Inpatient Hospital Stay: Payer: Medicare PPO

## 2022-10-09 VITALS — BP 101/76 | HR 75 | Temp 97.7°F | Resp 18 | Ht 59.0 in | Wt 101.2 lb

## 2022-10-09 DIAGNOSIS — Z79899 Other long term (current) drug therapy: Secondary | ICD-10-CM | POA: Diagnosis not present

## 2022-10-09 DIAGNOSIS — D500 Iron deficiency anemia secondary to blood loss (chronic): Secondary | ICD-10-CM

## 2022-10-09 MED ORDER — SODIUM CHLORIDE 0.9 % IV SOLN
200.0000 mg | Freq: Once | INTRAVENOUS | Status: AC
  Administered 2022-10-09: 200 mg via INTRAVENOUS
  Filled 2022-10-09: qty 200

## 2022-10-09 MED ORDER — SODIUM CHLORIDE 0.9 % IV SOLN: Freq: Once | INTRAVENOUS | Status: AC

## 2022-10-09 NOTE — Patient Instructions (Signed)
Iron Sucrose Injection What is this medication? IRON SUCROSE (EYE ern SOO krose) treats low levels of iron (iron deficiency anemia) in people with kidney disease. Iron is a mineral that plays an important role in making red blood cells, which carry oxygen from your lungs to the rest of your body. This medicine may be used for other purposes; ask your health care provider or pharmacist if you have questions. COMMON BRAND NAME(S): Venofer What should I tell my care team before I take this medication? They need to know if you have any of these conditions: Anemia not caused by low iron levels Heart disease High levels of iron in the blood Kidney disease Liver disease An unusual or allergic reaction to iron, other medications, foods, dyes, or preservatives Pregnant or trying to get pregnant Breastfeeding How should I use this medication? This medication is for infusion into a vein. It is given in a hospital or clinic setting. Talk to your care team about the use of this medication in children. While this medication may be prescribed for children as young as 2 years for selected conditions, precautions do apply. Overdosage: If you think you have taken too much of this medicine contact a poison control center or emergency room at once. NOTE: This medicine is only for you. Do not share this medicine with others. What if I miss a dose? Keep appointments for follow-up doses. It is important not to miss your dose. Call your care team if you are unable to keep an appointment. What may interact with this medication? Do not take this medication with any of the following: Deferoxamine Dimercaprol Other iron products This medication may also interact with the following: Chloramphenicol Deferasirox This list may not describe all possible interactions. Give your health care provider a list of all the medicines, herbs, non-prescription drugs, or dietary supplements you use. Also tell them if you smoke,  drink alcohol, or use illegal drugs. Some items may interact with your medicine. What should I watch for while using this medication? Visit your care team regularly. Tell your care team if your symptoms do not start to get better or if they get worse. You may need blood work done while you are taking this medication. You may need to follow a special diet. Talk to your care team. Foods that contain iron include: whole grains/cereals, dried fruits, beans, or peas, leafy green vegetables, and organ meats (liver, kidney). What side effects may I notice from receiving this medication? Side effects that you should report to your care team as soon as possible: Allergic reactions--skin rash, itching, hives, swelling of the face, lips, tongue, or throat Low blood pressure--dizziness, feeling faint or lightheaded, blurry vision Shortness of breath Side effects that usually do not require medical attention (report to your care team if they continue or are bothersome): Flushing Headache Joint pain Muscle pain Nausea Pain, redness, or irritation at injection site This list may not describe all possible side effects. Call your doctor for medical advice about side effects. You may report side effects to FDA at 1-800-FDA-1088. Where should I keep my medication? This medication is given in a hospital or clinic. It will not be stored at home. NOTE: This sheet is a summary. It may not cover all possible information. If you have questions about this medicine, talk to your doctor, pharmacist, or health care provider.  2024 Elsevier/Gold Standard (2022-05-25 00:00:00)

## 2022-10-10 ENCOUNTER — Encounter: Payer: Medicare PPO | Admitting: Family Medicine

## 2022-10-10 ENCOUNTER — Ambulatory Visit (INDEPENDENT_AMBULATORY_CARE_PROVIDER_SITE_OTHER)
Admission: RE | Admit: 2022-10-10 | Discharge: 2022-10-10 | Disposition: A | Discharge status: Home / Self Care | Payer: Medicare PPO | Source: Ambulatory Visit | Attending: Vascular Surgery | Admitting: Vascular Surgery

## 2022-10-10 ENCOUNTER — Ambulatory Visit (HOSPITAL_COMMUNITY)
Admission: RE | Admit: 2022-10-10 | Discharge: 2022-10-10 | Disposition: A | Discharge status: Home / Self Care | Payer: Medicare PPO | Source: Ambulatory Visit | Attending: Vascular Surgery | Admitting: Vascular Surgery

## 2022-10-10 DIAGNOSIS — I6523 Occlusion and stenosis of bilateral carotid arteries: Secondary | ICD-10-CM | POA: Diagnosis not present

## 2022-10-10 DIAGNOSIS — I7143 Infrarenal abdominal aortic aneurysm, without rupture: Secondary | ICD-10-CM

## 2022-10-11 ENCOUNTER — Encounter: Payer: Self-pay | Admitting: Family Medicine

## 2022-10-11 ENCOUNTER — Ambulatory Visit (INDEPENDENT_AMBULATORY_CARE_PROVIDER_SITE_OTHER): Payer: Medicare PPO | Admitting: Family Medicine

## 2022-10-11 VITALS — BP 134/90 | HR 102 | Temp 97.2°F | Ht 59.0 in | Wt 100.0 lb

## 2022-10-11 DIAGNOSIS — S81811A Laceration without foreign body, right lower leg, initial encounter: Secondary | ICD-10-CM | POA: Diagnosis not present

## 2022-10-11 MED ORDER — CEPHALEXIN 250 MG/5ML PO SUSR
250.0000 mg | Freq: Two times a day (BID) | ORAL | Status: DC
Start: 2022-10-11 — End: 2022-10-11

## 2022-10-11 MED ORDER — CEPHALEXIN 250 MG PO CAPS: 250.0000 mg | ORAL_CAPSULE | Freq: Two times a day (BID) | ORAL | 0 refills | Status: DC

## 2022-10-11 NOTE — Addendum Note (Signed)
Addended by: Precious Reel on: 10/11/2022 03:42 PM   Modules accepted: Orders

## 2022-10-11 NOTE — Assessment & Plan Note (Signed)
Acute Wound culture sent Keflex 250 mg by mouth 250 mg Q 12 for 7 days FU for worsening redness, swelling, purulent drainage, or fever.

## 2022-10-11 NOTE — Progress Notes (Signed)
Acute Office Visit  Subjective:    Patient ID: Allison Jimenez, female    DOB: [birth year], 72 y.o.   MRN: [id number]  Chief Complaint  Patient presents with   Cut on right lower leg    HPI: Patient is in today with her older sister patient prior for right for wound to the right lower leg.  Patient states that this happened 1 week ago when she was getting out of her bed and hit the walker.  She said her sister has been dressing the wound for her at home.  Her sister noticed this wound has not healed as fast as her previous wounds. She has noticed that it is increasing redness, swelling, warm to touch. Patient denies fever. Patient has only used triple antibiotic ointment on the wound. She has not had a Tdap in over 10 years. Patient is a smoker and despite trying to cut back she still smokes a pack a day.   Past Medical History:  Diagnosis Date   Acute blood loss anemia (ABLA) 09/05/2020   Allergy    ANXIETY 06/05/2006   Arthritis    SHOULDERS    BACK PAIN 01/05/2010   Cataract    bilateral, left worse   Centrilobular emphysema (HCC)    COPD (chronic obstructive pulmonary disease) (HCC)    Depression    DIVERTICULOSIS, COLON 06/05/2006   DIZZINESS OR VERTIGO 06/05/2006   positional   GERD (gastroesophageal reflux disease)    Hearing loss in left ear    no hearing aid   History of blood transfusion 08/2020   History of kidney stones    passed stones   HYPERLIPIDEMIA 06/05/2006   Hypertension    HYPOKALEMIA 12/14/2008   Hypothyroidism    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   Qualifier: Diagnosis of  By: Amador Cunas  MD, Janett Labella    Pneumonia    TOBACCO USER 11/25/2007    Past Surgical History:  Procedure Laterality Date   APPENDECTOMY     bilateral cataract surgery      BRONCHIAL BIOPSY  09/28/2021   Procedure: BRONCHIAL BIOPSIES;  Surgeon: Omar Person, MD;  Location: [facility] ENDOSCOPY;  Service: Pulmonary;;   BRONCHIAL NEEDLE ASPIRATION BIOPSY  09/28/2021   Procedure:  BRONCHIAL NEEDLE ASPIRATION BIOPSIES;  Surgeon: Omar Person, MD;  Location: [facility] ENDOSCOPY;  Service: Pulmonary;;   BRONCHIAL WASHINGS  09/28/2021   Procedure: BRONCHIAL WASHINGS;  Surgeon: Omar Person, MD;  Location[facility] ENDOSCOPY;  Service: Pulmonary;;   CESAREAN SECTION     x1   CHOLECYSTECTOMY     COLONOSCOPY     COSMETIC SURGERY     CYSTOSCOPY W/ URETERAL STENT PLACEMENT Right 07/29/2022   Procedure: CYSTOSCOPY WITH RETROGRADE PYELOGRAM/URETERAL STENT PLACEMENT;  Surgeon: Malen Gauze, MD;  Location: [facility] ORS;  Service: Urology;  Laterality: Right;   CYSTOSCOPY/URETEROSCOPY/HOLMIUM LASER/STENT PLACEMENT Right 09/05/2022   Procedure: RIGHT URETEROSCOPY/HOLMIUM LASER/STENT PLACEMENT;  Surgeon: Despina Arias, MD;  Location: [facility] ORS;  Service: Urology;  Laterality: Right;   mastoid surgery  1992   shunt in mastoid- endolymphatic sac in left ear    MRI  07/11/2020   x several, last one located in CE   TUBAL LIGATION     UPPER GI ENDOSCOPY     vocal cord surgery     nodule x2    Family History  Problem Relation Age of Onset   Breast cancer Mother    Dementia Mother    Heart disease Mother    Hypertension  Mother    Thyroid disease Mother    Alzheimer's disease Father    Heart disease Father    Hypertension Father    Thyroid disease Father    Diabetes Sister    Colon cancer Neg Hx    Rectal cancer Neg Hx    Stomach cancer Neg Hx    Colon polyps Neg Hx    Esophageal cancer Neg Hx     Social History   Socioeconomic History   Marital status: Divorced    Spouse name: Not on file   Number of children: 1   Years of education: 13   Highest education level: Not on file  Occupational History   Occupation: ASSISTANT CREDIT MANAGER    Employer: [employer]    Comment: Retired  Tobacco Use   Smoking status: Every Day    Current packs/day: 0.50    Average packs/day: 0.5 packs/day for 49.0 years (24.5 ttl pk-yrs)    Types: Cigarettes   Smokeless  tobacco: Never   Tobacco comments:    3/4 ppd 08/30/21 Vernie Murders, CMA  Vaping Use   Vaping status: Former   Start date: 01/01/2010   Quit date: 08/01/2021   Substances: Nicotine   Devices: menthol/fruit flavor  Substance and Sexual Activity   Alcohol use: No    Alcohol/week: 0.0 standard drinks of alcohol   Drug use: No   Sexual activity: Not Currently    Birth control/protection: Post-menopausal  Other Topics Concern   Not on file  Social History Narrative   Patient lives at home alone. Patient has a Biochemist, clinical. Patient is divorced .   Patient is retired.   Education high school.   Right handed.   Caffeine none.   Social Determinants of Health   Financial Resource Strain: Not on file  Food Insecurity: No Food Insecurity (09/27/2022)   Hunger Vital Sign    Worried About Running Out of Food in the Last Year: Never true    Ran Out of Food in the Last Year: Never true  Transportation Needs: No Transportation Needs (09/27/2022)   PRAPARE - Administrator, Civil Service (Medical): No    Lack of Transportation (Non-Medical): No  Physical Activity: Not on file  Stress: Not on file  Social Connections: Unknown (05/12/2021)   [facility]   Social Network    Social Network: Not on file  Intimate Partner Violence: Not At Risk (07/29/2022)   Humiliation, Afraid, Rape, and Kick questionnaire    Fear of Current or Ex-Partner: No    Emotionally Abused: No    Physically Abused: No    Sexually Abused: No    Outpatient Medications Prior to Visit  Medication Sig Dispense Refill   acetaminophen (TYLENOL) 500 MG tablet Take 500-1,000 mg by mouth every 6 (six) hours as needed for mild pain, moderate pain or headache.     albuterol (VENTOLIN HFA) 108 (90 Base) MCG/ACT inhaler Inhale 2 puffs into the lungs every 6 (six) hours as needed for shortness of breath or wheezing. 8 g 2   ALPRAZolam (XANAX) 0.25 MG tablet Take 0.5-1 tablets  (0.125-0.25 mg total) by mouth daily as needed for anxiety or sleep. 30 tablet 1   atorvastatin (LIPITOR) 20 MG tablet TAKE 1 TABLET(20 MG) BY MOUTH DAILY 90 tablet 1   calcitonin, salmon, (MIACALCIN/FORTICAL) 200 UNIT/ACT nasal spray Place 1 spray into alternate nostrils daily.     Carboxymethylcellul-Glycerin (LUBRICATING EYE DROPS OP) Place 1 drop  into both eyes daily.     carvedilol (COREG) 25 MG tablet TAKE 1 TABLET(25 MG) BY MOUTH TWICE DAILY WITH A MEAL 180 tablet 0   cetirizine (ZYRTEC) 10 MG tablet Take 10 mg by mouth daily as needed for allergies.     chlorthalidone (HYGROTON) 25 MG tablet Take 25 mg by mouth daily.     Cholecalciferol (VITAMIN D) 50 MCG (2000 UT) CAPS Take 2,000 Units by mouth daily.     conjugated estrogens (PREMARIN) vaginal cream Place 1 applicator vaginally daily as needed (dryness / burning).     cyclobenzaprine (FLEXERIL) 10 MG tablet Take 1 tablet (10 mg total) by mouth 3 (three) times daily as needed for muscle spasms. 30 tablet 2   diclofenac Sodium (VOLTAREN) 1 % GEL Apply 1 Application topically 4 (four) times daily as needed (pain).     Erenumab-aooe (AIMOVIG) 140 MG/ML SOAJ Inject 140 mg as directed every 30 (thirty) days.     famotidine (PEPCID) 40 MG tablet TAKE 1 TABLET(40 MG) BY MOUTH AT BEDTIME (Patient taking differently: Take 40 mg by mouth at bedtime.) 90 tablet 3   FLUoxetine (PROZAC) 20 MG capsule TAKE 2 CAPSULES(40 MG) BY MOUTH DAILY 180 capsule 0   fluticasone (FLONASE) 50 MCG/ACT nasal spray SHAKE LIQUID AND USE 2 SPRAYS IN EACH NOSTRIL DAILY (Patient taking differently: Place 2 sprays into both nostrils daily as needed for allergies.) 16 g 6   Fluticasone-Umeclidin-Vilant (TRELEGY ELLIPTA) 100-62.5-25 MCG/ACT AEPB Inhale 1 puff into the lungs daily. 3 each 3   hyoscyamine (LEVSIN/SL) 0.125 MG SL tablet Place 1 tablet (0.125 mg total) under the tongue every 6 (six) hours as needed. 30 tablet 0   levothyroxine (SYNTHROID) 25 MCG tablet Take 1  tablet (25 mcg total) by mouth daily. 90 tablet 0   linaclotide (LINZESS) 145 MCG CAPS capsule Take 1 capsule (145 mcg total) by mouth daily as needed (constipation).     memantine (NAMENDA) 10 MG tablet Take 1 tablet (10 mg total) by mouth daily for 14 days, THEN 1 tablet (10 mg total) 2 (two) times daily for 16 days. 46 tablet 0   montelukast (SINGULAIR) 10 MG tablet Take 10 mg by mouth at bedtime.     OVER THE COUNTER MEDICATION Take 1 tablet by mouth in the morning. Salt Supplement     oxyCODONE-acetaminophen (PERCOCET) 10-325 MG tablet Take 1 tablet by mouth 4 (four) times daily as needed for pain.     pantoprazole (PROTONIX) 40 MG tablet TAKE 1 TABLET(40 MG) BY MOUTH DAILY 90 tablet 0   potassium chloride (MICRO-K) 10 MEQ CR capsule Take 2 capsules (20 mEq total) by mouth 2 (two) times daily. 360 capsule 1   pregabalin (LYRICA) 25 MG capsule Take 1 capsule (25 mg total) by mouth 3 (three) times daily. 90 capsule 2   pregabalin (LYRICA) 50 MG capsule Take 50 mg by mouth 2 (two) times daily as needed (anxiety).     promethazine (PHENERGAN) 25 MG tablet TAKE 1 TABLET BY MOUTH EVERY 8 HOURS IF NEEDED 20 tablet 0   rizatriptan (MAXALT) 10 MG tablet Take by mouth.     sennosides-docusate sodium (SENOKOT-S) 8.6-50 MG tablet Take 1 tablet by mouth daily. 30 tablet 3   vitamin B-12 (CYANOCOBALAMIN) 500 MCG tablet Take 500 mcg by mouth daily at 12 noon.     QUEtiapine (SEROQUEL) 25 MG tablet Take 1 tablet (25 mg total) by mouth 2 (two) times daily as needed for up to 7 days. (Patient  not taking: Reported on 08/23/2022) 14 tablet 0   sulfamethoxazole-trimethoprim (BACTRIM DS) 800-160 MG tablet Take 1 tablet by mouth 2 (two) times daily. 14 tablet 0   No facility-administered medications prior to visit.    Allergies  Allergen Reactions   Erythromycin Base Diarrhea   Vfend [Voriconazole] Other (See Comments)    Visual changes   Penicillins Rash    Review of Systems  Constitutional:  Negative for  appetite change, fatigue and fever.  HENT:  Negative for congestion, ear pain, sinus pressure and sore throat.   Respiratory:  Negative for cough, chest tightness, shortness of breath and wheezing.   Cardiovascular:  Negative for chest pain and palpitations.  Gastrointestinal:  Positive for nausea. Negative for abdominal pain, constipation and diarrhea.  Genitourinary:  Negative for dysuria and hematuria.  Musculoskeletal:  Negative for arthralgias, back pain, joint swelling and myalgias.  Skin:  Positive for wound (cut on right lower leg). Negative for rash.  Neurological:  Negative for dizziness, weakness and headaches.  Psychiatric/Behavioral:  Negative for dysphoric mood. The patient is not nervous/anxious.        Objective:        10/11/2022   10:17 AM 10/09/2022    2:55 PM 10/09/2022    1:59 PM  Vitals with BMI  Height 4\' 11"   4\' 11"   Weight 100 lbs  101 lbs 4 oz  BMI 20.19  20.44  Systolic 134 101 784  Diastolic 90 76 54  Pulse 102 75 76    Orthostatic VS for the past 72 hrs (Last 3 readings):  Patient Position BP Location  10/11/22 1017 Sitting Left Arm     Physical Exam Vitals reviewed.  Constitutional:      General: She is not in acute distress.    Appearance: Normal appearance. She is normal weight. She is not ill-appearing.  HENT:     Head: Normocephalic.  Cardiovascular:     Rate and Rhythm: Regular rhythm. Tachycardia present.     Heart sounds: Normal heart sounds.  Pulmonary:     Effort: Pulmonary effort is normal.     Breath sounds: Wheezing present.  Chest:     Chest wall: No tenderness.  Musculoskeletal:     Right lower leg: 2+ Pitting Edema present.     Left lower leg: 2+ Pitting Edema present.  Skin:    Findings: Signs of injury and laceration present.     Comments: Right lower extremity  Neurological:     Mental Status: She is alert.      Health Maintenance Due  Topic Date Due   Zoster Vaccines- Shingrix (2 of 2) 06/22/2016    DTaP/Tdap/Td (2 - Td or Tdap) 05/17/2020   Medicare Annual Wellness (AWV)  08/17/2022    There are no preventive care reminders to display for this patient.   Lab Results  Component Value Date   TSH 2.640 09/17/2022   Lab Results  Component Value Date   WBC 10.0 09/27/2022   HGB 10.8 (A) 09/27/2022   HCT 33 (A) 09/27/2022   MCV 88 09/17/2022   PLT 546 (A) 09/27/2022   Lab Results  Component Value Date   NA 136 09/17/2022   K 4.1 09/17/2022   CO2 23 09/17/2022   GLUCOSE 124 (H) 09/17/2022   BUN 15 09/17/2022   CREATININE 0.55 (L) 09/17/2022   BILITOT 0.3 09/17/2022   ALKPHOS 139 (H) 09/17/2022   AST 10 09/17/2022   ALT 9 09/17/2022   PROT 6.3 09/17/2022  ALBUMIN 3.6 (L) 09/17/2022   CALCIUM 9.8 09/17/2022   ANIONGAP 13 07/31/2022   EGFR 99 09/17/2022   GFR 75.48 07/09/2016   Lab Results  Component Value Date   CHOL 98 (L) 08/08/2022   Lab Results  Component Value Date   HDL 41 08/08/2022   Lab Results  Component Value Date   LDLCALC 25 08/08/2022   Lab Results  Component Value Date   TRIG 203 (H) 08/08/2022   Lab Results  Component Value Date   CHOLHDL 2.4 08/08/2022   Lab Results  Component Value Date   HGBA1C 5.6 08/08/2022       Assessment & Plan:  Skin tear of right lower leg without complication, initial encounter Assessment & Plan: Acute Wound culture sent Keflex 250 mg by mouth 250 mg Q 12 for 7 days FU for worsening redness, swelling, purulent drainage, or fever.    Orders: -     Anaerobic and Aerobic Culture -     Cephalexin     Meds ordered this encounter  Medications   cephALEXin (KEFLEX) 250 MG/5ML suspension 250 mg    Orders Placed This Encounter  Procedures   Anaerobic and Aerobic Culture     Follow-up: Return if symptoms worsen or fail to improve, for keep schedule phone visit for AWV.  An After Visit Summary was printed and given to the patient.  Total time spent on today's visit was greater than 30 minutes,  including both face-to-face time and nonface-to-face time personally spent on review of chart (labs and imaging), discussing labs and goals, discussing further work-up, treatment options, referrals to specialist if needed, reviewing outside records of pertinent, answering patient's questions, and coordinating care.   Lajuana Matte, FNP [facility] [phone]

## 2022-10-12 MED FILL — Iron Sucrose Inj 20 MG/ML (Fe Equiv): INTRAVENOUS | Qty: 10 | Status: AC

## 2022-10-14 ENCOUNTER — Other Ambulatory Visit: Payer: Self-pay | Admitting: Family Medicine

## 2022-10-14 DIAGNOSIS — F03A4 Unspecified dementia, mild, with anxiety: Secondary | ICD-10-CM

## 2022-10-15 ENCOUNTER — Inpatient Hospital Stay: Payer: Medicare PPO

## 2022-10-16 LAB — ANAEROBIC AND AEROBIC CULTURE

## 2022-10-17 DIAGNOSIS — N201 Calculus of ureter: Secondary | ICD-10-CM | POA: Diagnosis not present

## 2022-10-19 DIAGNOSIS — D125 Benign neoplasm of sigmoid colon: Secondary | ICD-10-CM | POA: Diagnosis not present

## 2022-10-19 DIAGNOSIS — D126 Benign neoplasm of colon, unspecified: Secondary | ICD-10-CM | POA: Diagnosis not present

## 2022-10-19 DIAGNOSIS — K2980 Duodenitis without bleeding: Secondary | ICD-10-CM | POA: Diagnosis not present

## 2022-10-19 DIAGNOSIS — K31819 Angiodysplasia of stomach and duodenum without bleeding: Secondary | ICD-10-CM | POA: Diagnosis not present

## 2022-10-19 DIAGNOSIS — K222 Esophageal obstruction: Secondary | ICD-10-CM | POA: Diagnosis not present

## 2022-10-19 DIAGNOSIS — D124 Benign neoplasm of descending colon: Secondary | ICD-10-CM | POA: Diagnosis not present

## 2022-10-19 DIAGNOSIS — K6389 Other specified diseases of intestine: Secondary | ICD-10-CM | POA: Diagnosis not present

## 2022-10-19 DIAGNOSIS — K635 Polyp of colon: Secondary | ICD-10-CM | POA: Diagnosis not present

## 2022-10-19 DIAGNOSIS — R1310 Dysphagia, unspecified: Secondary | ICD-10-CM | POA: Diagnosis not present

## 2022-10-19 DIAGNOSIS — D509 Iron deficiency anemia, unspecified: Secondary | ICD-10-CM | POA: Diagnosis not present

## 2022-10-19 DIAGNOSIS — K449 Diaphragmatic hernia without obstruction or gangrene: Secondary | ICD-10-CM | POA: Diagnosis not present

## 2022-10-19 DIAGNOSIS — R634 Abnormal weight loss: Secondary | ICD-10-CM | POA: Diagnosis not present

## 2022-10-19 DIAGNOSIS — J449 Chronic obstructive pulmonary disease, unspecified: Secondary | ICD-10-CM | POA: Diagnosis not present

## 2022-10-19 DIAGNOSIS — K219 Gastro-esophageal reflux disease without esophagitis: Secondary | ICD-10-CM | POA: Diagnosis not present

## 2022-10-19 DIAGNOSIS — D122 Benign neoplasm of ascending colon: Secondary | ICD-10-CM | POA: Diagnosis not present

## 2022-10-19 LAB — HM COLONOSCOPY

## 2022-10-21 ENCOUNTER — Encounter: Payer: Self-pay | Admitting: Oncology

## 2022-10-21 MED FILL — Iron Sucrose Inj 20 MG/ML (Fe Equiv): INTRAVENOUS | Qty: 10 | Status: AC

## 2022-10-22 ENCOUNTER — Inpatient Hospital Stay: Payer: Medicare PPO

## 2022-10-22 NOTE — Progress Notes (Unsigned)
[facility] [place]  ASSESSMENT / PLAN: Allison Jimenez is a 72 y.o. female with a 45mm infrarenal abdominal aortic aneurysm  The patient is not yet a candidate for repair based on size criteria. Her estimated annual rupture risk is 1-5%.   Recommend the following to reduce the risk of major adverse cardiac / limb events.  Complete cessation from all tobacco products. Blood glucose control with goal A1c < 7%. Blood pressure control with goal blood pressure < 140/90 mmHg. Lipid reduction therapy with goal LDL-C <100 mg/dL. Aspirin 81mg  PO QD.  Atorvastatin 40-80mg  PO QD (or other "high intensity" statin therapy).  Follow up with me in 6 months with AAA duplex and carotid artery duplex.  I will refer her to Dr. Duwayne Heck for evaluation of right shoulder pain and lack of range of motion.  CHIEF COMPLAINT: AAA growth  HISTORY OF PRESENT ILLNESS: Allison Jimenez is a 72 y.o. female who returns to clinic for discussion of abdominal aortic aneurysm.  This was noted to grow slightly from 3.8 to 4.1 cm on recent serial imaging.  The patient has no symptoms referable to her abdominal aortic aneurysm.  We spent the majority of our visit discussing the natural history abdominal aortic aneurysm disease, the rationale for the threshold of 5 cm for intervention in females, and the rationale for surveillance.  10/23/22: Patient returns to clinic for surveillance of carotid artery stenosis and abdominal aortic aneurysm.  She reports over the past 6 months she has been much less active.  She is frustrated by her inability to be independent and to get up and do things she previously enjoyed.  She is particularly bothered by her right shoulder which has very limited range of motion.  We reviewed her noninvasive test in detail.  Past Medical History:  Diagnosis Date   Acute blood loss anemia (ABLA) 09/05/2020   Allergy    ANXIETY 06/05/2006   Arthritis    SHOULDERS    BACK PAIN  01/05/2010   Cataract    bilateral, left worse   Centrilobular emphysema (HCC)    COPD (chronic obstructive pulmonary disease) (HCC)    Depression    DIVERTICULOSIS, COLON 06/05/2006   DIZZINESS OR VERTIGO 06/05/2006   positional   GERD (gastroesophageal reflux disease)    Hearing loss in left ear    no hearing aid   History of blood transfusion 08/2020   History of kidney stones    passed stones   HYPERLIPIDEMIA 06/05/2006   Hypertension    HYPOKALEMIA 12/14/2008   Hypothyroidism    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   Qualifier: Diagnosis of  By: Amador Cunas  MD, Janett Labella    Pneumonia    TOBACCO USER 11/25/2007    Past Surgical History:  Procedure Laterality Date   APPENDECTOMY     bilateral cataract surgery      BRONCHIAL BIOPSY  09/28/2021   Procedure: BRONCHIAL BIOPSIES;  Surgeon: Omar Person, MD;  Location: [facility] ENDOSCOPY;  Service: Pulmonary;;   BRONCHIAL NEEDLE ASPIRATION BIOPSY  09/28/2021   Procedure: BRONCHIAL NEEDLE ASPIRATION BIOPSIES;  Surgeon: Omar Person, MD;  Location: [facility] ENDOSCOPY;  Service: Pulmonary;;   BRONCHIAL WASHINGS  09/28/2021   Procedure: BRONCHIAL WASHINGS;  Surgeon: Omar Person, MD;  Location: [facility] ENDOSCOPY;  Service: Pulmonary;;   CESAREAN SECTION     x1   CHOLECYSTECTOMY     COLONOSCOPY     COSMETIC SURGERY     CYSTOSCOPY W/ URETERAL STENT PLACEMENT  Right 07/29/2022   Procedure: CYSTOSCOPY WITH RETROGRADE PYELOGRAM/URETERAL STENT PLACEMENT;  Surgeon: Malen Gauze, MD;  Location: [facility] ORS;  Service: Urology;  Laterality: Right;   CYSTOSCOPY/URETEROSCOPY/HOLMIUM LASER/STENT PLACEMENT Right 09/05/2022   Procedure: RIGHT URETEROSCOPY/HOLMIUM LASER/STENT PLACEMENT;  Surgeon: Despina Arias, MD;  Location: [facility] ORS;  Service: Urology;  Laterality: Right;   mastoid surgery  1992   shunt in mastoid- endolymphatic sac in left ear    MRI  07/11/2020   x several, last one located in CE   TUBAL LIGATION     UPPER GI ENDOSCOPY      vocal cord surgery     nodule x2    Family History  Problem Relation Age of Onset   Breast cancer Mother    Dementia Mother    Heart disease Mother    Hypertension Mother    Thyroid disease Mother    Alzheimer's disease Father    Heart disease Father    Hypertension Father    Thyroid disease Father    Diabetes Sister    Colon cancer Neg Hx    Rectal cancer Neg Hx    Stomach cancer Neg Hx    Colon polyps Neg Hx    Esophageal cancer Neg Hx     Social History   Socioeconomic History   Marital status: Divorced    Spouse name: Not on file   Number of children: 1   Years of education: 13   Highest education level: Not on file  Occupational History   Occupation: ASSISTANT CREDIT MANAGER    Employer: [employer]    Comment: Retired  Tobacco Use   Smoking status: Every Day    Current packs/day: 0.50    Average packs/day: 0.5 packs/day for 49.0 years (24.5 ttl pk-yrs)    Types: Cigarettes   Smokeless tobacco: Never   Tobacco comments:    3/4 ppd 08/30/21 Vernie Murders, CMA  Vaping Use   Vaping status: Former   Start date: 01/01/2010   Quit date: 08/01/2021   Substances: Nicotine   Devices: menthol/fruit flavor  Substance and Sexual Activity   Alcohol use: No    Alcohol/week: 0.0 standard drinks of alcohol   Drug use: No   Sexual activity: Not Currently    Birth control/protection: Post-menopausal  Other Topics Concern   Not on file  Social History Narrative   Patient lives at home alone. Patient has a Biochemist, clinical. Patient is divorced .   Patient is retired.   Education high school.   Right handed.   Caffeine none.   Social Determinants of Health   Financial Resource Strain: Not on file  Food Insecurity: No Food Insecurity (09/27/2022)   Hunger Vital Sign    Worried About Running Out of Food in the Last Year: Never true    Ran Out of Food in the Last Year: Never true  Transportation Needs: No Transportation Needs (09/27/2022)   PRAPARE -  Administrator, Civil Service (Medical): No    Lack of Transportation (Non-Medical): No  Physical Activity: Not on file  Stress: Not on file  Social Connections: Unknown (05/12/2021)   [facility]   Social Network    Social Network: Not on file  Intimate Partner Violence: Not At Risk (07/29/2022)   Humiliation, Afraid, Rape, and Kick questionnaire    Fear of Current or Ex-Partner: No    Emotionally Abused: No    Physically Abused: No    Sexually  Abused: No    Allergies  Allergen Reactions   Erythromycin Base Diarrhea   Vfend [Voriconazole] Other (See Comments)    Visual changes   Penicillins Rash    Current Outpatient Medications  Medication Sig Dispense Refill   acetaminophen (TYLENOL) 500 MG tablet Take 500-1,000 mg by mouth every 6 (six) hours as needed for mild pain, moderate pain or headache.     albuterol (VENTOLIN HFA) 108 (90 Base) MCG/ACT inhaler Inhale 2 puffs into the lungs every 6 (six) hours as needed for shortness of breath or wheezing. 8 g 2   ALPRAZolam (XANAX) 0.25 MG tablet Take 0.5-1 tablets (0.125-0.25 mg total) by mouth daily as needed for anxiety or sleep. 30 tablet 1   atorvastatin (LIPITOR) 20 MG tablet TAKE 1 TABLET(20 MG) BY MOUTH DAILY 90 tablet 1   calcitonin, salmon, (MIACALCIN/FORTICAL) 200 UNIT/ACT nasal spray Place 1 spray into alternate nostrils daily.     Carboxymethylcellul-Glycerin (LUBRICATING EYE DROPS OP) Place 1 drop into both eyes daily.     carvedilol (COREG) 25 MG tablet TAKE 1 TABLET(25 MG) BY MOUTH TWICE DAILY WITH A MEAL 180 tablet 0   cephALEXin (KEFLEX) 250 MG capsule Take 1 capsule (250 mg total) by mouth 2 (two) times daily. 14 capsule 0   cetirizine (ZYRTEC) 10 MG tablet Take 10 mg by mouth daily as needed for allergies.     chlorthalidone (HYGROTON) 25 MG tablet Take 25 mg by mouth daily.     Cholecalciferol (VITAMIN D) 50 MCG (2000 UT) CAPS Take 2,000 Units by mouth daily.     conjugated  estrogens (PREMARIN) vaginal cream Place 1 applicator vaginally daily as needed (dryness / burning).     cyclobenzaprine (FLEXERIL) 10 MG tablet Take 1 tablet (10 mg total) by mouth 3 (three) times daily as needed for muscle spasms. 30 tablet 2   diclofenac Sodium (VOLTAREN) 1 % GEL Apply 1 Application topically 4 (four) times daily as needed (pain).     Erenumab-aooe (AIMOVIG) 140 MG/ML SOAJ Inject 140 mg as directed every 30 (thirty) days.     famotidine (PEPCID) 40 MG tablet TAKE 1 TABLET(40 MG) BY MOUTH AT BEDTIME (Patient taking differently: Take 40 mg by mouth at bedtime.) 90 tablet 3   FLUoxetine (PROZAC) 20 MG capsule TAKE 2 CAPSULES(40 MG) BY MOUTH DAILY 180 capsule 0   fluticasone (FLONASE) 50 MCG/ACT nasal spray SHAKE LIQUID AND USE 2 SPRAYS IN EACH NOSTRIL DAILY (Patient taking differently: Place 2 sprays into both nostrils daily as needed for allergies.) 16 g 6   Fluticasone-Umeclidin-Vilant (TRELEGY ELLIPTA) 100-62.5-25 MCG/ACT AEPB Inhale 1 puff into the lungs daily. 3 each 3   hyoscyamine (LEVSIN/SL) 0.125 MG SL tablet Place 1 tablet (0.125 mg total) under the tongue every 6 (six) hours as needed. 30 tablet 0   levothyroxine (SYNTHROID) 25 MCG tablet Take 1 tablet (25 mcg total) by mouth daily. 90 tablet 0   linaclotide (LINZESS) 145 MCG CAPS capsule Take 1 capsule (145 mcg total) by mouth daily as needed (constipation).     memantine (NAMENDA) 10 MG tablet TAKE 1 TABLET(10 MG) BY MOUTH DAILY FOR 14 DAYS THEN TAKE 1 TABLET(10 MG) BY MOUTH TWICE DAILY FOR 16 DAYS 46 tablet 0   montelukast (SINGULAIR) 10 MG tablet Take 10 mg by mouth at bedtime.     OVER THE COUNTER MEDICATION Take 1 tablet by mouth in the morning. Salt Supplement     oxyCODONE-acetaminophen (PERCOCET) 10-325 MG tablet Take 1 tablet by mouth 4 (  four) times daily as needed for pain.     pantoprazole (PROTONIX) 40 MG tablet TAKE 1 TABLET(40 MG) BY MOUTH DAILY 90 tablet 0   potassium chloride (MICRO-K) 10 MEQ CR capsule  Take 2 capsules (20 mEq total) by mouth 2 (two) times daily. 360 capsule 1   pregabalin (LYRICA) 25 MG capsule Take 1 capsule (25 mg total) by mouth 3 (three) times daily. 90 capsule 2   pregabalin (LYRICA) 50 MG capsule Take 50 mg by mouth 2 (two) times daily as needed (anxiety).     promethazine (PHENERGAN) 25 MG tablet TAKE 1 TABLET BY MOUTH EVERY 8 HOURS IF NEEDED 20 tablet 0   QUEtiapine (SEROQUEL) 25 MG tablet Take 1 tablet (25 mg total) by mouth 2 (two) times daily as needed for up to 7 days. (Patient not taking: Reported on 08/23/2022) 14 tablet 0   rizatriptan (MAXALT) 10 MG tablet Take by mouth.     sennosides-docusate sodium (SENOKOT-S) 8.6-50 MG tablet Take 1 tablet by mouth daily. 30 tablet 3   vitamin B-12 (CYANOCOBALAMIN) 500 MCG tablet Take 500 mcg by mouth daily at 12 noon.     No current facility-administered medications for this visit.    PHYSICAL EXAM There were no vitals filed for this visit.   Elderly woman in no acute distress Regular rate and rhythm Unlabored breathing Soft, nontender abdomen 2+ posterior tibial pulses bilaterally   PERTINENT LABORATORY AND RADIOLOGIC DATA  Most recent CBC    Latest Ref Rng & Units 09/27/2022   12:00 AM 09/17/2022    3:43 PM 08/08/2022    3:32 PM  CBC  WBC  10.0     12.8  12.0   Hemoglobin 12.0 - 16.0 10.8     10.5  10.9   Hematocrit 36 - 46 33     33.1  34.8   Platelets 150 - 400 K/uL 546     576  556      This result is from an external source.     Most recent CMP    Latest Ref Rng & Units 09/17/2022    3:43 PM 08/08/2022    3:32 PM 07/31/2022    3:59 AM  CMP  Glucose 70 - 99 mg/dL 073  710  84   BUN 8 - 27 mg/dL 15  7  9    Creatinine 0.57 - 1.00 mg/dL 6.26  9.48  5.46   Sodium 134 - 144 mmol/L 136  138  132   Potassium 3.5 - 5.2 mmol/L 4.1  5.2  3.3   Chloride 96 - 106 mmol/L 99  105  99   CO2 20 - 29 mmol/L 23  20  20    Calcium 8.7 - 10.3 mg/dL 9.8  8.9  8.2   Total Protein 6.0 - 8.5 g/dL 6.3  5.9    Total  Bilirubin 0.0 - 1.2 mg/dL 0.3  0.3    Alkaline Phos 44 - 121 IU/L 139  209    AST 0 - 40 IU/L 10  25    ALT 0 - 32 IU/L 9  15      Renal function CrCl cannot be calculated (Patient's most recent lab result is older than the maximum 21 days allowed.).  Hgb A1c MFr Bld (%)  Date Value  08/08/2022 5.6    LDL Chol Calc (NIH)  Date Value Ref Range Status  08/08/2022 25 0 - 99 mg/dL Final   Direct LDL  Date Value Ref Range Status  09/13/2011 151.9 mg/dL Final    Comment:    Optimal:  <100 mg/dLNear or Above Optimal:  100-129 mg/dLBorderline High:  130-159 mg/dLHigh:  160-189 mg/dLVery High:  >190 mg/dL    Abdominal aortic aneurysm duplex Greatest measurement found today is 4.5 cm.  Right Carotid: Velocities in the right ICA are consistent with a 60-79%                 stenosis. Non-hemodynamically significant plaque <50% noted  in                the CCA.   Left Carotid: Velocities in the left ICA are consistent with a 1-39%  stenosis.               Non-hemodynamically significant plaque <50% noted in the  CCA.   Vertebrals: Bilateral vertebral arteries demonstrate antegrade flow.  Subclavians: Normal flow hemodynamics were seen in bilateral subclavian               arteries.   Rande Brunt. Lenell Antu, MD FACS [facility] Phone Number: [phone] 10/22/2022 8:34 AM   Total time spent on preparing this encounter including chart review, data review, collecting history, examining the patient, coordinating care for this established patient, 30 minutes.  Portions of this report may have been transcribed using voice recognition software.  Every effort has been made to ensure accuracy; however, inadvertent computerized transcription errors may still be present.

## 2022-10-23 ENCOUNTER — Ambulatory Visit: Payer: Medicare PPO | Admitting: Vascular Surgery

## 2022-10-23 ENCOUNTER — Encounter: Payer: Self-pay | Admitting: Vascular Surgery

## 2022-10-23 VITALS — BP 121/68 | HR 78 | Temp 96.4°F | Ht 59.0 in | Wt 99.7 lb

## 2022-10-23 DIAGNOSIS — I6523 Occlusion and stenosis of bilateral carotid arteries: Secondary | ICD-10-CM | POA: Diagnosis not present

## 2022-10-23 DIAGNOSIS — I7143 Infrarenal abdominal aortic aneurysm, without rupture: Secondary | ICD-10-CM | POA: Diagnosis not present

## 2022-10-24 ENCOUNTER — Encounter: Payer: Self-pay | Admitting: Family Medicine

## 2022-10-24 ENCOUNTER — Ambulatory Visit: Payer: Medicare PPO | Admitting: Family Medicine

## 2022-10-24 VITALS — BP 130/56 | HR 76

## 2022-10-24 DIAGNOSIS — S81801A Unspecified open wound, right lower leg, initial encounter: Secondary | ICD-10-CM | POA: Insufficient documentation

## 2022-10-24 DIAGNOSIS — Z0000 Encounter for general adult medical examination without abnormal findings: Secondary | ICD-10-CM | POA: Diagnosis not present

## 2022-10-24 NOTE — Assessment & Plan Note (Signed)
Discussed eating habits, she eats when she is hungry.  Smaller meals  Colonoscopy completed and biopsy sent, awaiting results

## 2022-10-24 NOTE — Progress Notes (Signed)
Subjective:   Allison Jimenez is a 72 y.o. female who presents for Medicare Annual (Subsequent) preventive examination.  Visit Complete: Virtual I connected with  [name] on 10/24/22 by a video enabled telemedicine application and verified that I am speaking with the correct person using two identifiers.  Patient Location: Home  Provider Location: Office/Clinic  I discussed the limitations of evaluation and management by telemedicine. The patient expressed understanding and agreed to proceed.  Vital Signs: Because this visit was a virtual/telehealth visit, some criteria may be missing or patient reported. Any vitals not documented were not able to be obtained and vitals that have been documented are patient reported.  Patient Medicare AWV questionnaire was completed by the patient; I have confirmed that all information answered by patient is correct and no changes since this date.  Non-healing wound to the right leg: Patient states that she met with a vascular doctor and he reported that her right carotid stenosis is @ 60-79% and this is most likely contributing to her wound not being able to heal. Patient also reports that she started smoking again (12 cigarettes/day). She states that she has been under a lot of stress with her health and starting smoking again. Denies fever shills, or redness. Some drainage reported. Culture obtained on 10/11/22 showed mixed skin flora and scant growth. Keflex was not taken as directed, she has 3 pills left.   Cardiac Risk Factors include: advanced age (>54men, >77 women);hypertension;smoking/ tobacco exposure    Objective:   Allison Jimenez was alert and oriented and she participated appropriately during our telephone visit.   Today's Vitals   10/24/22 1400 10/24/22 1457 10/24/22 1608  BP: (!) 130/56    Pulse: 76    PainSc: 2  2  2    PainLoc: Back     There is no height or weight on file to calculate BMI.     09/27/2022    4:13 PM  09/05/2022    7:15 AM 09/04/2022    2:59 PM 08/27/2022    3:01 PM 07/28/2022    1:29 PM 07/25/2022   12:00 AM 07/24/2022    3:18 PM  Advanced Directives  Does Patient Have a Medical Advance Directive? No No No No No No No  Would patient like information on creating a medical advance directive?  No - Patient declined  No - Patient declined No - Patient declined No - Patient declined     Current Medications (verified) Outpatient Encounter Medications as of 10/24/2022  Medication Sig   acetaminophen (TYLENOL) 500 MG tablet Take 500-1,000 mg by mouth every 6 (six) hours as needed for mild pain, moderate pain or headache.   albuterol (VENTOLIN HFA) 108 (90 Base) MCG/ACT inhaler Inhale 2 puffs into the lungs every 6 (six) hours as needed for shortness of breath or wheezing.   ALPRAZolam (XANAX) 0.25 MG tablet Take 0.5-1 tablets (0.125-0.25 mg total) by mouth daily as needed for anxiety or sleep.   atorvastatin (LIPITOR) 20 MG tablet TAKE 1 TABLET(20 MG) BY MOUTH DAILY   calcitonin, salmon, (MIACALCIN/FORTICAL) 200 UNIT/ACT nasal spray Place 1 spray into alternate nostrils daily.   Carboxymethylcellul-Glycerin (LUBRICATING EYE DROPS OP) Place 1 drop into both eyes daily.   carvedilol (COREG) 25 MG tablet TAKE 1 TABLET(25 MG) BY MOUTH TWICE DAILY WITH A MEAL   cephALEXin (KEFLEX) 250 MG capsule Take 1 capsule (250 mg total) by mouth 2 (two) times daily.   cetirizine (ZYRTEC) 10 MG tablet Take 10 mg  by mouth daily as needed for allergies.   chlorthalidone (HYGROTON) 25 MG tablet Take 25 mg by mouth daily.   Cholecalciferol (VITAMIN D) 50 MCG (2000 UT) CAPS Take 2,000 Units by mouth daily.   conjugated estrogens (PREMARIN) vaginal cream Place 1 applicator vaginally daily as needed (dryness / burning).   cyclobenzaprine (FLEXERIL) 10 MG tablet Take 1 tablet (10 mg total) by mouth 3 (three) times daily as needed for muscle spasms.   diclofenac Sodium (VOLTAREN) 1 % GEL Apply 1 Application topically 4 (four)  times daily as needed (pain).   Erenumab-aooe (AIMOVIG) 140 MG/ML SOAJ Inject 140 mg as directed every 30 (thirty) days.   famotidine (PEPCID) 40 MG tablet TAKE 1 TABLET(40 MG) BY MOUTH AT BEDTIME (Patient taking differently: Take 40 mg by mouth at bedtime.)   FLUoxetine (PROZAC) 20 MG capsule TAKE 2 CAPSULES(40 MG) BY MOUTH DAILY   fluticasone (FLONASE) 50 MCG/ACT nasal spray SHAKE LIQUID AND USE 2 SPRAYS IN EACH NOSTRIL DAILY (Patient taking differently: Place 2 sprays into both nostrils daily as needed for allergies.)   Fluticasone-Umeclidin-Vilant (TRELEGY ELLIPTA) 100-62.5-25 MCG/ACT AEPB Inhale 1 puff into the lungs daily.   hyoscyamine (LEVSIN/SL) 0.125 MG SL tablet Place 1 tablet (0.125 mg total) under the tongue every 6 (six) hours as needed.   levothyroxine (SYNTHROID) 25 MCG tablet Take 1 tablet (25 mcg total) by mouth daily.   linaclotide (LINZESS) 145 MCG CAPS capsule Take 1 capsule (145 mcg total) by mouth daily as needed (constipation).   memantine (NAMENDA) 10 MG tablet TAKE 1 TABLET(10 MG) BY MOUTH DAILY FOR 14 DAYS THEN TAKE 1 TABLET(10 MG) BY MOUTH TWICE DAILY FOR 16 DAYS   montelukast (SINGULAIR) 10 MG tablet Take 10 mg by mouth at bedtime.   OVER THE COUNTER MEDICATION Take 1 tablet by mouth in the morning. Salt Supplement   oxyCODONE-acetaminophen (PERCOCET) 10-325 MG tablet Take 1 tablet by mouth 4 (four) times daily as needed for pain.   pantoprazole (PROTONIX) 40 MG tablet TAKE 1 TABLET(40 MG) BY MOUTH DAILY   potassium chloride (MICRO-K) 10 MEQ CR capsule Take 2 capsules (20 mEq total) by mouth 2 (two) times daily.   pregabalin (LYRICA) 25 MG capsule Take 1 capsule (25 mg total) by mouth 3 (three) times daily.   pregabalin (LYRICA) 50 MG capsule Take 50 mg by mouth 2 (two) times daily as needed (anxiety).   promethazine (PHENERGAN) 25 MG tablet TAKE 1 TABLET BY MOUTH EVERY 8 HOURS IF NEEDED   QUEtiapine (SEROQUEL) 25 MG tablet Take 1 tablet (25 mg total) by mouth 2 (two)  times daily as needed for up to 7 days. (Patient not taking: Reported on 08/23/2022)   rizatriptan (MAXALT) 10 MG tablet Take by mouth.   sennosides-docusate sodium (SENOKOT-S) 8.6-50 MG tablet Take 1 tablet by mouth daily.   vitamin B-12 (CYANOCOBALAMIN) 500 MCG tablet Take 500 mcg by mouth daily at 12 noon.   No facility-administered encounter medications on file as of 10/24/2022.    Allergies (verified) Erythromycin base, Vfend [voriconazole], and Penicillins   History: Past Medical History:  Diagnosis Date   Acute blood loss anemia (ABLA) 09/05/2020   Allergy    ANXIETY 06/05/2006   Arthritis    SHOULDERS    BACK PAIN 01/05/2010   Carotid artery occlusion    Cataract    bilateral, left worse   Centrilobular emphysema (HCC)    COPD (chronic obstructive pulmonary disease) (HCC)    Depression    DIVERTICULOSIS, COLON 06/05/2006  DIZZINESS OR VERTIGO 06/05/2006   positional   GERD (gastroesophageal reflux disease)    Hearing loss in left ear    no hearing aid   History of blood transfusion 08/2020   History of kidney stones    passed stones   HYPERLIPIDEMIA 06/05/2006   Hypertension    HYPOKALEMIA 12/14/2008   Hypothyroidism    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   Qualifier: Diagnosis of  By: Amador Cunas  MD, Janett Labella    Pneumonia    TOBACCO USER 11/25/2007   Past Surgical History:  Procedure Laterality Date   APPENDECTOMY     bilateral cataract surgery      BRONCHIAL BIOPSY  09/28/2021   Procedure: BRONCHIAL BIOPSIES;  Surgeon: Omar Person, MD;  Location: [facility] ENDOSCOPY;  Service: Pulmonary;;   BRONCHIAL NEEDLE ASPIRATION BIOPSY  09/28/2021   Procedure: BRONCHIAL NEEDLE ASPIRATION BIOPSIES;  Surgeon: Omar Person, MD;  Location[facility]COPY;  Service: Pulmonary;;   BRONCHIAL WASHINGS  09/28/2021   Procedure: BRONCHIAL WASHINGS;  Surgeon: Omar Person, MD;  Location: [facility] ENDOSCOPY;  Service: Pulmonary;;   CESAREAN SECTION     x1   CHOLECYSTECTOMY      COLONOSCOPY     COSMETIC SURGERY     CYSTOSCOPY W/ URETERAL STENT PLACEMENT Right 07/29/2022   Procedure: CYSTOSCOPY WITH RETROGRADE PYELOGRAM/URETERAL STENT PLACEMENT;  Surgeon: Malen Gauze, MD;  Location: [facility] ORS;  Service: Urology;  Laterality: Right;   CYSTOSCOPY/URETEROSCOPY/HOLMIUM LASER/STENT PLACEMENT Right 09/05/2022   Procedure: RIGHT URETEROSCOPY/HOLMIUM LASER/STENT PLACEMENT;  Surgeon: Despina Arias, MD;  Location: [facility] ORS;  Service: Urology;  Laterality: Right;   mastoid surgery  1992   shunt in mastoid- endolymphatic sac in left ear    MRI  07/11/2020   x several, last one located in CE   TUBAL LIGATION     UPPER GI ENDOSCOPY     vocal cord surgery     nodule x2   Family History  Problem Relation Age of Onset   Breast cancer Mother    Dementia Mother    Heart disease Mother    Hypertension Mother    Thyroid disease Mother    Alzheimer's disease Father    Heart disease Father    Hypertension Father    Thyroid disease Father    Diabetes Sister    Colon cancer Neg Hx    Rectal cancer Neg Hx    Stomach cancer Neg Hx    Colon polyps Neg Hx    Esophageal cancer Neg Hx    Social History   Socioeconomic History   Marital status: Divorced    Spouse name: Not on file   Number of children: 1   Years of education: 13   Highest education level: Not on file  Occupational History   Occupation: ASSISTANT CREDIT MANAGER    Employer: [employer]    Comment: Retired  Tobacco Use   Smoking status: Every Day    Current packs/day: 0.50    Average packs/day: 0.5 packs/day for 49.0 years (24.5 ttl pk-yrs)    Types: Cigarettes   Smokeless tobacco: Never   Tobacco comments:    3/4 ppd 08/30/21 Vernie Murders, CMA  Vaping Use   Vaping status: Former   Start date: 01/01/2010   Quit date: 08/01/2021   Substances: Nicotine   Devices: menthol/fruit flavor  Substance and Sexual Activity   Alcohol use: No    Alcohol/week: 0.0 standard drinks of alcohol   Drug  use: No   Sexual  activity: Not Currently    Birth control/protection: Post-menopausal  Other Topics Concern   Not on file  Social History Narrative   Patient lives at home alone. Patient has a Biochemist, clinical. Patient is divorced .   Patient is retired.   Education high school.   Right handed.   Caffeine none.   Social Determinants of Health   Financial Resource Strain: Not on file  Food Insecurity: No Food Insecurity (09/27/2022)   Hunger Vital Sign    Worried About Running Out of Food in the Last Year: Never true    Ran Out of Food in the Last Year: Never true  Transportation Needs: No Transportation Needs (09/27/2022)   PRAPARE - Administrator, Civil Service (Medical): No    Lack of Transportation (Non-Medical): No  Physical Activity: Not on file  Stress: Not on file  Social Connections: Unknown (05/12/2021)   [facility]   Social Network    Social Network: Not on file    Tobacco Counseling Ready to quit: Not Answered Counseling given: Not Answered Tobacco comments: 3/4 ppd 08/30/21 Vernie Murders, CMA   Clinical Intake:  Pre-visit preparation completed: No  Pain : 0-10 Pain Score: 2  Pain Type: Chronic pain Pain Location: Back Pain Orientation: Lower Pain Descriptors / Indicators: Aching Pain Onset: More than a month ago Pain Frequency: Several days a week Pain Relieving Factors: medication Effect of Pain on Daily Activities: yes, she needs help putting her bra on  Pain Relieving Factors: medication  Nutritional Status: BMI of 19-24  Normal Nutritional Risks: Non-healing wound Diabetes: Yes (Pediabetes) CBG done?: No Did pt. bring in CBG monitor from home?: No  How often do you need to have someone help you when you read instructions, pamphlets, or other written materials from your doctor or pharmacy?: 1 - Never Interpreter Needed?: No    Activities of Daily Living    10/24/2022    3:00 PM 09/04/2022     3:00 PM  In your present state of health, do you have any difficulty performing the following activities:  Hearing? 0   Vision? 0   Difficulty concentrating or making decisions? 1   Walking or climbing stairs? 1   Dressing or bathing? 0   Doing errands, shopping? 1 0  Preparing Food and eating ? N   Using the Toilet? N   In the past six months, have you accidently leaked urine? Y   Do you have problems with loss of bowel control? Y   Managing your Medications? N   Managing your Finances? N   Housekeeping or managing your Housekeeping? Y     Patient Care Team: Blane Ohara, MD as PCP - General (Family Medicine) Bevelyn Ngo, NP as Nurse Practitioner (Pulmonary Disease) Quinn Plowman, PA-C as Physician Assistant (General Practice) Misenheimer, Marcial Pacas, MD as Consulting Physician (Gastroenterology) Sinda Du, MD as Consulting Physician (Ophthalmology) Florene Glen (Physician Assistant) Omar Person, MD (Inactive) as Consulting Physician (Pulmonary Disease)  Indicate any recent Medical Services you may have received from other than [facility] providers in the past year (date may be approximate).     Assessment:   This is a routine wellness examination for Gretchan.  Lindee was seen today for medicare wellness.  Encounter for Medicare annual wellness exam Assessment & Plan: Discussed eating habits, she eats when she is hungry.  Smaller meals  Colonoscopy completed and biopsy sent, awaiting results   Non-healing wound of right  lower extremity Assessment & Plan: Not improving per patient Has not taken antibiotics as prescribed. Recommend patient leave open to air during the day and dress during the night.  OTC triple antibiotic ointment to wound  Fu on Monday if wound is not getting better, any fever, redness or drainage.  Plan is to change antibiotic and order wound care management    Hearing/Vision screen No results found.   Goals Addressed              This Visit's Progress    Have 3 meals a day   On track    Eat when she is hungry Stomach has smaller and can't eat as much Eating smaller meals     Keep Myself Safe   On track    Timeframe:  Short-Term Goal Priority:  Medium Start Date:                             Expected End Date:                       Follow Up Date 09/01/2021/   - accept and begin counseling    Why is this important?   Being hurt by someone close to you is scary.  Having a plan to keep you safe is important.    Notes:       Depression Screen    09/27/2022    4:21 PM 09/17/2022    3:07 PM 01/31/2022    2:27 PM 01/25/2022   10:26 AM 01/08/2022    2:23 PM 10/23/2021   10:33 AM 08/16/2021    2:13 PM  PHQ 2/9 Scores  PHQ - 2 Score 2 1 0 2 0 1 1  PHQ- 9 Score  3  3  3      Fall Risk    05/22/2022   11:54 AM 01/31/2022    2:27 PM 01/25/2022   10:26 AM 01/08/2022    2:23 PM 10/23/2021   10:32 AM  Fall Risk   Falls in the past year? 1 0 0 0 0  Number falls in past yr: 1 0 0 0 0  Injury with Fall? 0  0 0 0  Risk for fall due to : History of fall(s);Impaired balance/gait;Impaired mobility  No Fall Risks Impaired balance/gait   Follow up Falls evaluation completed  Falls evaluation completed Falls evaluation completed Falls evaluation completed    MEDICARE RISK AT HOME: Medicare Risk at Home Any stairs in or around the home?: Yes If so, are there any without handrails?: Yes Home free of loose throw rugs in walkways, pet beds, electrical cords, etc?: Yes (couple left) Adequate lighting in your home to reduce risk of falls?: Yes Life alert?: No Use of a cane, walker or w/c?: Yes Grab bars in the bathroom?: Yes Shower chair or bench in shower?: Yes Elevated toilet seat or a handicapped toilet?: Yes  DONT FORGET TO DO THIS PART UNDER THE MEDICARE TAB  TIMED UP AND GO:  Was the test performed?  No    Cognitive Function:    10/24/2022    3:08 PM  MMSE - Mini Mental State Exam  Not completed: Refused         10/24/2022    3:09 PM 08/16/2021    2:15 PM  6CIT Screen  What Year? 0 points 0 points  What month? 0 points 0 points  What time? 0 points 0  points  Count back from 20 0 points 0 points  Months in reverse 0 points 0 points  Repeat phrase 0 points 0 points  Total Score 0 points 0 points    Immunizations Immunization History  Administered Date(s) Administered   Fluad Quad(high Dose 65+) 10/15/2019, 10/17/2020, 10/23/2021   Fluad Trivalent(High Dose 65+) 09/17/2022   Influenza Split 09/20/2011   Influenza, High Dose Seasonal PF 10/28/2016   Influenza,inj,Quad PF,6+ Mos 09/23/2013, 12/12/2015   Influenza-Unspecified 10/27/2014, 10/30/2018   PFIZER(Purple Top)SARS-COV-2 Vaccination 02/08/2019, 02/28/2019, 10/15/2019   Pfizer Covid-19 Vaccine Bivalent Booster 73yrs & up 10/17/2020   Pfizer(Comirnaty)Fall Seasonal Vaccine 12 years and older 10/23/2021, 09/17/2022   Pneumococcal Conjugate-13 01/24/2017   Pneumococcal Polysaccharide-23 05/18/2010, 10/30/2018   Tdap 05/18/2010   Zoster Recombinant(Shingrix) 04/27/2016   Zoster, Live 12/21/2013    Screening Tests Health Maintenance  Topic Date Due   Zoster Vaccines- Shingrix (2 of 2) 06/22/2016   DTaP/Tdap/Td (2 - Td or Tdap) 05/17/2020   COVID-19 Vaccine (7 - 2023-24 season) 11/12/2022   Diabetic kidney evaluation - Urine ACR  01/26/2023   OPHTHALMOLOGY EXAM  02/02/2023   HEMOGLOBIN A1C  02/08/2023   Lung Cancer Screening  06/23/2023   Diabetic kidney evaluation - eGFR measurement  09/17/2023   FOOT EXAM  09/17/2023   Medicare Annual Wellness (AWV)  10/24/2023   MAMMOGRAM  03/06/2024   Colonoscopy  10/18/2032   Pneumonia Vaccine 75+ Years old  Completed   INFLUENZA VACCINE  Completed   DEXA SCAN  Completed   Hepatitis C Screening  Completed   HPV VACCINES  Aged Out    Health Maintenance  Health Maintenance Due  Topic Date Due   Zoster Vaccines- Shingrix (2 of 2) 06/22/2016   DTaP/Tdap/Td (2 - Td or Tdap)  05/17/2020   Additional Screening:  Vision Screening: Recommended annual ophthalmology exams for early detection of glaucoma and other disorders of the eye. Is the patient up to date with their annual eye exam?  Yes  Who is the provider or what is the name of the office in which the patient attends annual eye exams? Did not ask If pt is not established with a provider, would they like to be referred to a provider to establish care? No .   Dental Screening: Recommended annual dental exams for proper oral hygiene  Community Resource Referral / Chronic Care Management: CRR required this visit?  No   CCM required this visit?  No     Plan:     I have personally reviewed and noted the following in the patient's chart:   Medical and social history Use of alcohol, tobacco or illicit drugs  Current medications and supplements including opioid prescriptions. Patient is currently taking opioid prescriptions. Information provided to patient regarding non-opioid alternatives. Patient advised to discuss non-opioid treatment plan with their provider. Functional ability and status Nutritional status Physical activity Advanced directives List of other physicians Hospitalizations, surgeries, and ER visits in previous 12 months Vitals Screenings to include cognitive, depression, and falls Referrals and appointments  In addition, I have reviewed and discussed with patient certain preventive protocols, quality metrics, and best practice recommendations. A written personalized care plan for preventive services as well as general preventive health recommendations were provided to patient.     Follow-up with Blane Ohara, MD as planned - 11/13/2022  Lajuana Matte, FNP [facility] [phone]

## 2022-10-24 NOTE — Assessment & Plan Note (Addendum)
Not improving per patient Has not taken antibiotics as prescribed. Recommend patient leave open to air during the day and dress during the night.  OTC triple antibiotic ointment to wound  Fu on Monday if wound is not getting better, any fever, redness or drainage.  Plan is to change antibiotic and order wound care management

## 2022-10-25 ENCOUNTER — Ambulatory Visit: Payer: Medicare PPO

## 2022-10-25 ENCOUNTER — Encounter: Payer: Self-pay | Admitting: Oncology

## 2022-10-26 ENCOUNTER — Inpatient Hospital Stay: Payer: Medicare PPO

## 2022-10-26 VITALS — BP 106/56 | HR 70 | Temp 97.9°F | Resp 16

## 2022-10-26 DIAGNOSIS — Z79899 Other long term (current) drug therapy: Secondary | ICD-10-CM | POA: Diagnosis not present

## 2022-10-26 DIAGNOSIS — D500 Iron deficiency anemia secondary to blood loss (chronic): Secondary | ICD-10-CM | POA: Diagnosis not present

## 2022-10-26 MED ORDER — IRON SUCROSE 20 MG/ML IV SOLN
200.0000 mg | Freq: Once | INTRAVENOUS | Status: AC
Start: 2022-10-26 — End: 2022-10-26
  Administered 2022-10-26: 200 mg via INTRAVENOUS

## 2022-10-26 MED ORDER — SODIUM CHLORIDE 0.9 % IV SOLN: Freq: Once | INTRAVENOUS | Status: AC

## 2022-10-26 NOTE — Patient Instructions (Signed)
Iron Sucrose Injection What is this medication? IRON SUCROSE (EYE ern SOO krose) treats low levels of iron (iron deficiency anemia) in people with kidney disease. Iron is a mineral that plays an important role in making red blood cells, which carry oxygen from your lungs to the rest of your body. This medicine may be used for other purposes; ask your health care provider or pharmacist if you have questions. COMMON BRAND NAME(S): Venofer What should I tell my care team before I take this medication? They need to know if you have any of these conditions: Anemia not caused by low iron levels Heart disease High levels of iron in the blood Kidney disease Liver disease An unusual or allergic reaction to iron, other medications, foods, dyes, or preservatives Pregnant or trying to get pregnant Breastfeeding How should I use this medication? This medication is for infusion into a vein. It is given in a hospital or clinic setting. Talk to your care team about the use of this medication in children. While this medication may be prescribed for children as young as 2 years for selected conditions, precautions do apply. Overdosage: If you think you have taken too much of this medicine contact a poison control center or emergency room at once. NOTE: This medicine is only for you. Do not share this medicine with others. What if I miss a dose? Keep appointments for follow-up doses. It is important not to miss your dose. Call your care team if you are unable to keep an appointment. What may interact with this medication? Do not take this medication with any of the following: Deferoxamine Dimercaprol Other iron products This medication may also interact with the following: Chloramphenicol Deferasirox This list may not describe all possible interactions. Give your health care provider a list of all the medicines, herbs, non-prescription drugs, or dietary supplements you use. Also tell them if you smoke,  drink alcohol, or use illegal drugs. Some items may interact with your medicine. What should I watch for while using this medication? Visit your care team regularly. Tell your care team if your symptoms do not start to get better or if they get worse. You may need blood work done while you are taking this medication. You may need to follow a special diet. Talk to your care team. Foods that contain iron include: whole grains/cereals, dried fruits, beans, or peas, leafy green vegetables, and organ meats (liver, kidney). What side effects may I notice from receiving this medication? Side effects that you should report to your care team as soon as possible: Allergic reactions--skin rash, itching, hives, swelling of the face, lips, tongue, or throat Low blood pressure--dizziness, feeling faint or lightheaded, blurry vision Shortness of breath Side effects that usually do not require medical attention (report to your care team if they continue or are bothersome): Flushing Headache Joint pain Muscle pain Nausea Pain, redness, or irritation at injection site This list may not describe all possible side effects. Call your doctor for medical advice about side effects. You may report side effects to FDA at 1-800-FDA-1088. Where should I keep my medication? This medication is given in a hospital or clinic. It will not be stored at home. NOTE: This sheet is a summary. It may not cover all possible information. If you have questions about this medicine, talk to your doctor, pharmacist, or health care provider.  2024 Elsevier/Gold Standard (2022-05-25 00:00:00)

## 2022-10-29 ENCOUNTER — Encounter: Payer: Self-pay | Admitting: Oncology

## 2022-10-29 ENCOUNTER — Other Ambulatory Visit: Payer: Self-pay

## 2022-10-29 MED ORDER — MONTELUKAST SODIUM 10 MG PO TABS: 10.0000 mg | ORAL_TABLET | Freq: Every day | ORAL | 3 refills | Status: DC

## 2022-10-29 MED ORDER — PROMETHAZINE HCL 25 MG PO TABS: ORAL_TABLET | ORAL | 0 refills | Status: AC

## 2022-10-30 ENCOUNTER — Inpatient Hospital Stay: Payer: Medicare PPO

## 2022-10-30 VITALS — BP 111/60 | HR 70 | Temp 98.1°F | Resp 16

## 2022-10-30 DIAGNOSIS — D500 Iron deficiency anemia secondary to blood loss (chronic): Secondary | ICD-10-CM | POA: Diagnosis not present

## 2022-10-30 DIAGNOSIS — Z79899 Other long term (current) drug therapy: Secondary | ICD-10-CM | POA: Diagnosis not present

## 2022-10-30 MED ORDER — IRON SUCROSE 20 MG/ML IV SOLN
200.0000 mg | Freq: Once | INTRAVENOUS | Status: AC
  Administered 2022-10-30: 200 mg via INTRAVENOUS
  Filled 2022-10-30: qty 10

## 2022-10-30 MED ORDER — SODIUM CHLORIDE 0.9 % IV SOLN: Freq: Once | INTRAVENOUS | Status: AC

## 2022-10-30 NOTE — Patient Instructions (Signed)
Iron Sucrose Injection What is this medication? IRON SUCROSE (EYE ern SOO krose) treats low levels of iron (iron deficiency anemia) in people with kidney disease. Iron is a mineral that plays an important role in making red blood cells, which carry oxygen from your lungs to the rest of your body. This medicine may be used for other purposes; ask your health care provider or pharmacist if you have questions. COMMON BRAND NAME(S): Venofer What should I tell my care team before I take this medication? They need to know if you have any of these conditions: Anemia not caused by low iron levels Heart disease High levels of iron in the blood Kidney disease Liver disease An unusual or allergic reaction to iron, other medications, foods, dyes, or preservatives Pregnant or trying to get pregnant Breastfeeding How should I use this medication? This medication is for infusion into a vein. It is given in a hospital or clinic setting. Talk to your care team about the use of this medication in children. While this medication may be prescribed for children as young as 2 years for selected conditions, precautions do apply. Overdosage: If you think you have taken too much of this medicine contact a poison control center or emergency room at once. NOTE: This medicine is only for you. Do not share this medicine with others. What if I miss a dose? Keep appointments for follow-up doses. It is important not to miss your dose. Call your care team if you are unable to keep an appointment. What may interact with this medication? Do not take this medication with any of the following: Deferoxamine Dimercaprol Other iron products This medication may also interact with the following: Chloramphenicol Deferasirox This list may not describe all possible interactions. Give your health care provider a list of all the medicines, herbs, non-prescription drugs, or dietary supplements you use. Also tell them if you smoke,  drink alcohol, or use illegal drugs. Some items may interact with your medicine. What should I watch for while using this medication? Visit your care team regularly. Tell your care team if your symptoms do not start to get better or if they get worse. You may need blood work done while you are taking this medication. You may need to follow a special diet. Talk to your care team. Foods that contain iron include: whole grains/cereals, dried fruits, beans, or peas, leafy green vegetables, and organ meats (liver, kidney). What side effects may I notice from receiving this medication? Side effects that you should report to your care team as soon as possible: Allergic reactions--skin rash, itching, hives, swelling of the face, lips, tongue, or throat Low blood pressure--dizziness, feeling faint or lightheaded, blurry vision Shortness of breath Side effects that usually do not require medical attention (report to your care team if they continue or are bothersome): Flushing Headache Joint pain Muscle pain Nausea Pain, redness, or irritation at injection site This list may not describe all possible side effects. Call your doctor for medical advice about side effects. You may report side effects to FDA at 1-800-FDA-1088. Where should I keep my medication? This medication is given in a hospital or clinic. It will not be stored at home. NOTE: This sheet is a summary. It may not cover all possible information. If you have questions about this medicine, talk to your doctor, pharmacist, or health care provider.  2024 Elsevier/Gold Standard (2022-05-25 00:00:00)

## 2022-10-31 ENCOUNTER — Encounter: Payer: Self-pay | Admitting: Family Medicine

## 2022-10-31 ENCOUNTER — Ambulatory Visit (INDEPENDENT_AMBULATORY_CARE_PROVIDER_SITE_OTHER): Payer: Medicare PPO | Admitting: Family Medicine

## 2022-10-31 VITALS — BP 128/74 | HR 84 | Temp 97.6°F | Ht 60.0 in | Wt 99.0 lb

## 2022-10-31 DIAGNOSIS — S81801A Unspecified open wound, right lower leg, initial encounter: Secondary | ICD-10-CM | POA: Diagnosis not present

## 2022-10-31 DIAGNOSIS — D508 Other iron deficiency anemias: Secondary | ICD-10-CM

## 2022-10-31 NOTE — Assessment & Plan Note (Signed)
Improving Patient has completed the full course of antibiotics Patient is currently leaving the wound open to air and dressing it at night. Follow-up next Tuesday with wound care management regarding getting a una boot placed

## 2022-10-31 NOTE — Progress Notes (Unsigned)
Subjective:  Patient ID: Allison Jimenez, female    DOB: [birth year]  Age: 72 y.o. MRN: [id number]  Chief Complaint  Patient presents with   Wound Check    Wound Check     Patient presents today for a wound check on her right lower leg. Patients sister has been wrapping her leg at night and leaving it open during the day.      09/27/2022    4:21 PM 09/17/2022    3:07 PM 01/31/2022    2:27 PM 01/25/2022   10:26 AM 01/08/2022    2:23 PM  Depression screen PHQ 2/9  Decreased Interest 1 1 0  0  Down, Depressed, Hopeless 1 0 0 2 0  PHQ - 2 Score 2 1 0 2 0  Altered sleeping  1  0   Tired, decreased energy  1  1   Change in appetite    0   Feeling bad or failure about yourself   0  0   Trouble concentrating  0  0   Moving slowly or fidgety/restless  0  0   Suicidal thoughts  0  0   PHQ-9 Score  3  3   Difficult doing work/chores  Not difficult at all  Not difficult at all         05/22/2022   11:54 AM  Fall Risk   Falls in the past year? 1  Number falls in past yr: 1  Injury with Fall? 0  Risk for fall due to : History of fall(s);Impaired balance/gait;Impaired mobility  Follow up Falls evaluation completed    Patient Care Team: Blane Ohara, MD as PCP - General (Family Medicine) Bevelyn Ngo, NP as Nurse Practitioner (Pulmonary Disease) Quinn Plowman, PA-C as Physician Assistant (General Practice) Misenheimer, Marcial Pacas, MD as Consulting Physician (Gastroenterology) Sinda Du, MD as Consulting Physician (Ophthalmology) Florene Glen (Physician Assistant) Omar Person, MD (Inactive) as Consulting Physician (Pulmonary Disease)   Review of Systems  Constitutional:  Negative for chills, fatigue and fever.  Respiratory:  Negative for cough and shortness of breath.   Cardiovascular:  Negative for chest pain.    Current Outpatient Medications on File Prior to Visit  Medication Sig Dispense Refill   acetaminophen (TYLENOL) 500 MG tablet Take 500-1,000 mg by  mouth every 6 (six) hours as needed for mild pain, moderate pain or headache.     albuterol (VENTOLIN HFA) 108 (90 Base) MCG/ACT inhaler Inhale 2 puffs into the lungs every 6 (six) hours as needed for shortness of breath or wheezing. 8 g 2   ALPRAZolam (XANAX) 0.25 MG tablet Take 0.5-1 tablets (0.125-0.25 mg total) by mouth daily as needed for anxiety or sleep. 30 tablet 1   atorvastatin (LIPITOR) 20 MG tablet TAKE 1 TABLET(20 MG) BY MOUTH DAILY 90 tablet 1   calcitonin, salmon, (MIACALCIN/FORTICAL) 200 UNIT/ACT nasal spray Place 1 spray into alternate nostrils daily.     Carboxymethylcellul-Glycerin (LUBRICATING EYE DROPS OP) Place 1 drop into both eyes daily.     carvedilol (COREG) 25 MG tablet TAKE 1 TABLET(25 MG) BY MOUTH TWICE DAILY WITH A MEAL 180 tablet 0   cephALEXin (KEFLEX) 250 MG capsule Take 1 capsule (250 mg total) by mouth 2 (two) times daily. 14 capsule 0   cetirizine (ZYRTEC) 10 MG tablet Take 10 mg by mouth daily as needed for allergies.     chlorthalidone (HYGROTON) 25 MG tablet Take 25 mg by mouth daily.     Cholecalciferol (  VITAMIN D) 50 MCG (2000 UT) CAPS Take 2,000 Units by mouth daily.     conjugated estrogens (PREMARIN) vaginal cream Place 1 applicator vaginally daily as needed (dryness / burning).     cyclobenzaprine (FLEXERIL) 10 MG tablet Take 1 tablet (10 mg total) by mouth 3 (three) times daily as needed for muscle spasms. 30 tablet 2   diclofenac Sodium (VOLTAREN) 1 % GEL Apply 1 Application topically 4 (four) times daily as needed (pain).     Erenumab-aooe (AIMOVIG) 140 MG/ML SOAJ Inject 140 mg as directed every 30 (thirty) days.     famotidine (PEPCID) 40 MG tablet TAKE 1 TABLET(40 MG) BY MOUTH AT BEDTIME (Patient taking differently: Take 40 mg by mouth at bedtime.) 90 tablet 3   FLUoxetine (PROZAC) 20 MG capsule TAKE 2 CAPSULES(40 MG) BY MOUTH DAILY 180 capsule 0   fluticasone (FLONASE) 50 MCG/ACT nasal spray SHAKE LIQUID AND USE 2 SPRAYS IN EACH NOSTRIL DAILY  (Patient taking differently: Place 2 sprays into both nostrils daily as needed for allergies.) 16 g 6   Fluticasone-Umeclidin-Vilant (TRELEGY ELLIPTA) 100-62.5-25 MCG/ACT AEPB Inhale 1 puff into the lungs daily. 3 each 3   hyoscyamine (LEVSIN/SL) 0.125 MG SL tablet Place 1 tablet (0.125 mg total) under the tongue every 6 (six) hours as needed. 30 tablet 0   levothyroxine (SYNTHROID) 25 MCG tablet Take 1 tablet (25 mcg total) by mouth daily. 90 tablet 0   linaclotide (LINZESS) 145 MCG CAPS capsule Take 1 capsule (145 mcg total) by mouth daily as needed (constipation).     memantine (NAMENDA) 10 MG tablet TAKE 1 TABLET(10 MG) BY MOUTH DAILY FOR 14 DAYS THEN TAKE 1 TABLET(10 MG) BY MOUTH TWICE DAILY FOR 16 DAYS 46 tablet 0   montelukast (SINGULAIR) 10 MG tablet Take 1 tablet (10 mg total) by mouth at bedtime. 90 tablet 3   OVER THE COUNTER MEDICATION Take 1 tablet by mouth in the morning. Salt Supplement     oxyCODONE-acetaminophen (PERCOCET) 10-325 MG tablet Take 1 tablet by mouth 4 (four) times daily as needed for pain.     pantoprazole (PROTONIX) 40 MG tablet TAKE 1 TABLET(40 MG) BY MOUTH DAILY 90 tablet 0   potassium chloride (MICRO-K) 10 MEQ CR capsule Take 2 capsules (20 mEq total) by mouth 2 (two) times daily. 360 capsule 1   pregabalin (LYRICA) 25 MG capsule Take 1 capsule (25 mg total) by mouth 3 (three) times daily. 90 capsule 2   pregabalin (LYRICA) 50 MG capsule Take 50 mg by mouth 2 (two) times daily as needed (anxiety).     promethazine (PHENERGAN) 25 MG tablet TAKE 1 TABLET BY MOUTH EVERY 8 HOURS IF NEEDED 20 tablet 0   QUEtiapine (SEROQUEL) 25 MG tablet Take 1 tablet (25 mg total) by mouth 2 (two) times daily as needed for up to 7 days. (Patient not taking: Reported on 08/23/2022) 14 tablet 0   rizatriptan (MAXALT) 10 MG tablet Take by mouth.     sennosides-docusate sodium (SENOKOT-S) 8.6-50 MG tablet Take 1 tablet by mouth daily. 30 tablet 3   vitamin B-12 (CYANOCOBALAMIN) 500 MCG  tablet Take 500 mcg by mouth daily at 12 noon.     No current facility-administered medications on file prior to visit.   Past Medical History:  Diagnosis Date   Acute blood loss anemia (ABLA) 09/05/2020   Allergy    ANXIETY 06/05/2006   Arthritis    SHOULDERS    BACK PAIN 01/05/2010   Carotid artery occlusion  Cataract    bilateral, left worse   Centrilobular emphysema (HCC)    COPD (chronic obstructive pulmonary disease) (HCC)    Depression    DIVERTICULOSIS, COLON 06/05/2006   DIZZINESS OR VERTIGO 06/05/2006   positional   GERD (gastroesophageal reflux disease)    Hearing loss in left ear    no hearing aid   History of blood transfusion 08/2020   History of kidney stones    passed stones   HYPERLIPIDEMIA 06/05/2006   Hypertension    HYPOKALEMIA 12/14/2008   Hypothyroidism    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   Qualifier: Diagnosis of  By: Amador Cunas  MD, Janett Labella    Pneumonia    TOBACCO USER 11/25/2007   Past Surgical History:  Procedure Laterality Date   APPENDECTOMY     bilateral cataract surgery      BRONCHIAL BIOPSY  09/28/2021   Procedure: BRONCHIAL BIOPSIES;  Surgeon: Omar Person, MD;  Location[facility] ENDOSCOPY;  Service: Pulmonary;;   BRONCHIAL NEEDLE ASPIRATION BIOPSY  09/28/2021   Procedure: BRONCHIAL NEEDLE ASPIRATION BIOPSIES;  Surgeon: Omar Person, MD;  Location: [facility] ENDOSCOPY;  Service: Pulmonary;;   BRONCHIAL WASHINGS  09/28/2021   Procedure: BRONCHIAL WASHINGS;  Surgeon: Omar Person, MD;  Location: [facility] ENDOSCOPY;  Service: Pulmonary;;   CESAREAN SECTION     x1   CHOLECYSTECTOMY     COLONOSCOPY     COSMETIC SURGERY     CYSTOSCOPY W/ URETERAL STENT PLACEMENT Right 07/29/2022   Procedure: CYSTOSCOPY WITH RETROGRADE PYELOGRAM/URETERAL STENT PLACEMENT;  Surgeon: Malen Gauze, MD;  Location: [facility] ORS;  Service: Urology;  Laterality: Right;   CYSTOSCOPY/URETEROSCOPY/HOLMIUM LASER/STENT PLACEMENT Right 09/05/2022   Procedure: RIGHT  URETEROSCOPY/HOLMIUM LASER/STENT PLACEMENT;  Surgeon: Despina Arias, MD;  Location: [facility];  Service: Urology;  Laterality: Right;   mastoid surgery  1992   shunt in mastoid- endolymphatic sac in left ear    MRI  07/11/2020   x several, last one located in CE   TUBAL LIGATION     UPPER GI ENDOSCOPY     vocal cord surgery     nodule x2    Family History  Problem Relation Age of Onset   Breast cancer Mother    Dementia Mother    Heart disease Mother    Hypertension Mother    Thyroid disease Mother    Alzheimer's disease Father    Heart disease Father    Hypertension Father    Thyroid disease Father    Diabetes Sister    Colon cancer Neg Hx    Rectal cancer Neg Hx    Stomach cancer Neg Hx    Colon polyps Neg Hx    Esophageal cancer Neg Hx    Social History   Socioeconomic History   Marital status: Divorced    Spouse name: Not on file   Number of children: 1   Years of education: 13   Highest education level: Not on file  Occupational History   Occupation: ASSISTANT CREDIT MANAGER    Employer: [employer]    Comment: Retired  Tobacco Use   Smoking status: Every Day    Current packs/day: 0.50    Average packs/day: 0.5 packs/day for 49.0 years (24.5 ttl pk-yrs)    Types: Cigarettes   Smokeless tobacco: Never   Tobacco comments:    3/4 ppd 08/30/21 Vernie Murders, CMA  Vaping Use   Vaping status: Former   Start date: 01/01/2010   Quit date: 08/01/2021   Substances:  Nicotine   Devices: menthol/fruit flavor  Substance and Sexual Activity   Alcohol use: No    Alcohol/week: 0.0 standard drinks of alcohol   Drug use: No   Sexual activity: Not Currently    Birth control/protection: Post-menopausal  Other Topics Concern   Not on file  Social History Narrative   Patient lives at home alone. Patient has a Biochemist, clinical. Patient is divorced .   Patient is retired.   Education high school.   Right handed.   Caffeine none.   Social Determinants of  Health   Financial Resource Strain: Low Risk  (10/31/2022)   Overall Financial Resource Strain (CARDIA)    Difficulty of Paying Living Expenses: Not hard at all  Food Insecurity: No Food Insecurity (09/27/2022)   Hunger Vital Sign    Worried About Running Out of Food in the Last Year: Never true    Ran Out of Food in the Last Year: Never true  Transportation Needs: No Transportation Needs (09/27/2022)   PRAPARE - Administrator, Civil Service (Medical): No    Lack of Transportation (Non-Medical): No  Physical Activity: Inactive (10/31/2022)   Exercise Vital Sign    Days of Exercise per Week: 0 days    Minutes of Exercise per Session: 0 min  Stress: No Stress Concern Present (10/31/2022)   [employer] of Occupational Health - Occupational Stress Questionnaire    Feeling of Stress : Not at all  Social Connections: Socially Isolated (10/31/2022)   Social Connection and Isolation Panel [NHANES]    Frequency of Communication with Friends and Family: Three times a week    Frequency of Social Gatherings with Friends and Family: Three times a week    Attends Religious Services: Never    Active Member of Clubs or Organizations: No    Attends Banker Meetings: Never    Marital Status: Divorced    Objective:  BP 128/74   Pulse 84   Temp 97.6 F (36.4 C)   Ht 5' (1.524 m)   Wt 99 lb (44.9 kg)   SpO2 95%   BMI 19.33 kg/m      10/31/2022    1:32 PM 10/30/2022    2:28 PM 10/30/2022    1:43 PM  BP/Weight  Systolic BP 128 111 118  Diastolic BP 74 60 68  Wt. (Lbs) 99    BMI 19.33 kg/m2      Physical Exam  Diabetic Foot Exam - Simple   No data filed      Lab Results  Component Value Date   WBC 10.0 09/27/2022   HGB 10.8 (A) 09/27/2022   HCT 33 (A) 09/27/2022   PLT 546 (A) 09/27/2022   GLUCOSE 124 (H) 09/17/2022   CHOL 98 (L) 08/08/2022   TRIG 203 (H) 08/08/2022   HDL 41 08/08/2022   LDLDIRECT 151.9 09/13/2011   LDLCALC 25 08/08/2022    ALT 9 09/17/2022   AST 10 09/17/2022   NA 136 09/17/2022   K 4.1 09/17/2022   CL 99 09/17/2022   CREATININE 0.55 (L) 09/17/2022   BUN 15 09/17/2022   CO2 23 09/17/2022   TSH 2.640 09/17/2022   INR 1.1 07/29/2022   HGBA1C 5.6 08/08/2022      Assessment & Plan:    Non-healing wound of right lower extremity Assessment & Plan: Improving Patient has completed the full course of antibiotics Patient is currently leaving the wound open to air and dressing it at night. Follow-up next  Tuesday with wound care management regarding getting a una boot placed    Other iron deficiency anemia     No orders of the defined types were placed in this encounter.   No orders of the defined types were placed in this encounter.    Follow-up: Return if symptoms worsen or fail to improve, keep scheduled appointment 11/12.   I,Katherina A Bramblett,acting as a scribe for Renne Crigler, FNP.,have documented all relevant documentation on the behalf of Renne Crigler, FNP,as directed by  Renne Crigler, FNP while in the presence of Renne Crigler, FNP.   An After Visit Summary was printed and given to the patient.  Total time spent on today's visit was greater than 30 minutes, including both face-to-face time and nonface-to-face time personally spent on review of chart (labs and imaging), discussing labs and goals, discussing further work-up, treatment options, referrals to specialist if needed, reviewing outside records of pertinent, answering patient's questions, and coordinating care.   Lajuana Matte, FNP [facility] [phone]

## 2022-11-12 ENCOUNTER — Ambulatory Visit: Payer: Medicare PPO | Admitting: Internal Medicine

## 2022-11-13 ENCOUNTER — Other Ambulatory Visit: Payer: Self-pay

## 2022-11-13 ENCOUNTER — Ambulatory Visit: Payer: Medicare PPO | Admitting: Family Medicine

## 2022-11-13 DIAGNOSIS — I6523 Occlusion and stenosis of bilateral carotid arteries: Secondary | ICD-10-CM

## 2022-11-13 DIAGNOSIS — I7143 Infrarenal abdominal aortic aneurysm, without rupture: Secondary | ICD-10-CM

## 2022-11-13 MED ORDER — CHLORTHALIDONE 25 MG PO TABS: 25.0000 mg | ORAL_TABLET | Freq: Every day | ORAL | 1 refills | Status: DC

## 2022-11-15 ENCOUNTER — Other Ambulatory Visit: Payer: Self-pay | Admitting: Family Medicine

## 2022-11-15 DIAGNOSIS — F411 Generalized anxiety disorder: Secondary | ICD-10-CM

## 2022-11-15 NOTE — Telephone Encounter (Signed)
Copied from CRM [id number]. Topic: Clinical - Medication Refill >> Nov 15, 2022 11:41 AM Dimitri Ped wrote: Most Recent Primary Care Visit:  Provider: Renne Crigler  Department: [facility]  Visit Type: OFFICE VISIT  Date: 10/31/2022  Medication: ***  Has the patient contacted their pharmacy?  (Agent: If no, request that the patient contact the pharmacy for the refill. If patient does not wish to contact the pharmacy document the reason why and proceed with request.) (Agent: If yes, when and what did the pharmacy advise?)  Is this the correct pharmacy for this prescription?  If no, delete pharmacy and type the correct one.  This is the patient's preferred pharmacy:  [id number] [place], [place] - [address] AT [facility] [address] & [address] [place] [address] Phone: [phone] Fax: [phone]  [id number] - [place], [place] - [phone] AT [facility] [address] [facility] [address] & [address] [place] [place] [address] Phone: [phone] Fax: [phone]   Has the prescription been filled recently?   Is the patient out of the medication?   Has the patient been seen for an appointment in the last year OR does the patient have an upcoming appointment?   Can we respond through MyChart?   Agent: Please be advised that Rx refills may take up to 3 business days. We ask that you follow-up with your pharmacy.

## 2022-11-16 DIAGNOSIS — G894 Chronic pain syndrome: Secondary | ICD-10-CM | POA: Diagnosis not present

## 2022-11-16 DIAGNOSIS — S32030A Wedge compression fracture of third lumbar vertebra, initial encounter for closed fracture: Secondary | ICD-10-CM | POA: Diagnosis not present

## 2022-11-16 DIAGNOSIS — S22070A Wedge compression fracture of T9-T10 vertebra, initial encounter for closed fracture: Secondary | ICD-10-CM | POA: Diagnosis not present

## 2022-11-16 DIAGNOSIS — M546 Pain in thoracic spine: Secondary | ICD-10-CM | POA: Diagnosis not present

## 2022-11-16 DIAGNOSIS — M545 Low back pain, unspecified: Secondary | ICD-10-CM | POA: Diagnosis not present

## 2022-11-16 DIAGNOSIS — Z79891 Long term (current) use of opiate analgesic: Secondary | ICD-10-CM | POA: Diagnosis not present

## 2022-11-16 DIAGNOSIS — Z79899 Other long term (current) drug therapy: Secondary | ICD-10-CM | POA: Diagnosis not present

## 2022-11-16 DIAGNOSIS — M8008XA Age-related osteoporosis with current pathological fracture, vertebra(e), initial encounter for fracture: Secondary | ICD-10-CM | POA: Diagnosis not present

## 2022-11-16 MED ORDER — FLUOXETINE HCL 20 MG PO CAPS: 40.0000 mg | ORAL_CAPSULE | Freq: Every day | ORAL | 0 refills | Status: DC

## 2022-11-22 ENCOUNTER — Other Ambulatory Visit: Payer: Self-pay | Admitting: Family Medicine

## 2022-11-22 DIAGNOSIS — E039 Hypothyroidism, unspecified: Secondary | ICD-10-CM

## 2022-12-02 ENCOUNTER — Other Ambulatory Visit: Payer: Self-pay

## 2022-12-02 DIAGNOSIS — I10 Essential (primary) hypertension: Secondary | ICD-10-CM

## 2022-12-05 DIAGNOSIS — M25811 Other specified joint disorders, right shoulder: Secondary | ICD-10-CM | POA: Diagnosis not present

## 2022-12-06 ENCOUNTER — Telehealth: Payer: Self-pay | Admitting: Family Medicine

## 2022-12-06 NOTE — Telephone Encounter (Signed)
[facility] pre op clearance form

## 2022-12-13 DIAGNOSIS — H02051 Trichiasis without entropian right upper eyelid: Secondary | ICD-10-CM | POA: Diagnosis not present

## 2022-12-13 DIAGNOSIS — H10412 Chronic giant papillary conjunctivitis, left eye: Secondary | ICD-10-CM | POA: Diagnosis not present

## 2022-12-16 NOTE — Progress Notes (Unsigned)
[facility]  [address] [place],  [place]  [address] [phone]  Clinic Day:  09/27/2022  Referring physician: Blane Ohara, MD   HISTORY OF PRESENT ILLNESS:  The patient is a 72 y.o. female  who I was asked to consult upon for an abnormal CBC.  Recent labs showed a low hemoglobin of 10.5, but elevated white cells and platelets at 12.8 and 576, respectively.  With respect to her anemia, she denies having any recently overt blood loss.  She had a bleeding ulcer 2 years ago, for which she needed 2 units of blood.  She previously took multiple aspirins daily, which led to that EGD being done.  She no longer uses aspirin or NSAIDs.  She last had a colonoscopy in 2018; she is scheduled for both an EGD and colonoscopy in October 2024.  With respect to her thrombocythemia, she denies having undergone a splenectomy.  She also denies having any acute trauma or recent surgery .    PAST MEDICAL HISTORY:   Past Medical History:  Diagnosis Date  . Acute blood loss anemia (ABLA) 09/05/2020  . Allergy   . ANXIETY 06/05/2006  . Arthritis    SHOULDERS   . BACK PAIN 01/05/2010  . Carotid artery occlusion   . Cataract    bilateral, left worse  . Centrilobular emphysema (HCC)   . COPD (chronic obstructive pulmonary disease) (HCC)   . Depression   . DIVERTICULOSIS, COLON 06/05/2006  . DIZZINESS OR VERTIGO 06/05/2006   positional  . GERD (gastroesophageal reflux disease)   . Hearing loss in left ear    no hearing aid  . History of blood transfusion 08/2020  . History of kidney stones    passed stones  . HYPERLIPIDEMIA 06/05/2006  . Hypertension   . HYPOKALEMIA 12/14/2008  . Hypothyroidism   . 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 Pneumonia   . TOBACCO USER 11/25/2007    PAST SURGICAL HISTORY:   Past Surgical History:  Procedure Laterality Date  . APPENDECTOMY    . bilateral cataract surgery     .  BRONCHIAL BIOPSY  09/28/2021   Procedure: BRONCHIAL BIOPSIES;  Surgeon: Omar Person, MD;  Location[facility] END[facility]COPY;  Service: Pulmonary;;  . BRONCHIAL NEEDLE ASPIRATION BIOPSY  09/28/2021   Procedure: BRONCHIAL NEEDLE ASPIRATION BIOPSIES;  Surgeon: Omar Person, MD;  Location: [facility] ENDOSCOPY;  Service: Pulmonary;;  . BRONCHIAL WASHINGS  09/28/2021   Procedure: BRONCHIAL WASHINGS;  Surgeon: Omar Person, MD;  Location: [facility] ENDOSCOPY;  Service: Pulmonary;;  . CESAREAN SECTION     x1  . CHOLECYSTECTOMY    . COLONOSCOPY    . COSMETIC SURGERY    . CYSTOSCOPY W/ URETERAL STENT PLACEMENT Right 07/29/2022   Procedure: CYSTOSCOPY WITH RETROGRADE PYELOGRAM/URETERAL STENT PLACEMENT;  Surgeon: Malen Gauze, MD;  Location: [facility] ORS;  Service: Urology;  Laterality: Right;  . CYSTOSCOPY/URETEROSCOPY/HOLMIUM LASER/STENT PLACEMENT Right 09/05/2022   Procedure: RIGHT URETEROSCOPY/HOLMIUM LASER/STENT PLACEMENT;  Surgeon: Despina Arias, MD;  Location: [facility] ORS;  Service: Urology;  Laterality: Right;  . mastoid surgery  1992   shunt in mastoid- endolymphatic sac in left ear   . MRI  07/11/2020   x several, last one located in CE  . TUBAL LIGATION    . UPPER GI ENDOSCOPY    . vocal cord surgery     nodule x2    CURRENT MEDICATIONS:   Current  Outpatient Medications  Medication Sig Dispense Refill  . acetaminophen (TYLENOL) 500 MG tablet Take 500-1,000 mg by mouth every 6 (six) hours as needed for mild pain, moderate pain or headache.    . albuterol (VENTOLIN HFA) 108 (90 Base) MCG/ACT inhaler Inhale 2 puffs into the lungs every 6 (six) hours as needed for shortness of breath or wheezing. 8 g 2  . ALPRAZolam (XANAX) 0.25 MG tablet Take 0.5-1 tablets (0.125-0.25 mg total) by mouth daily as needed for anxiety or sleep. 30 tablet 1  . atorvastatin (LIPITOR) 20 MG tablet TAKE 1 TABLET(20 MG) BY MOUTH DAILY 90 tablet 1  . calcitonin, salmon, (MIACALCIN/FORTICAL) 200 UNIT/ACT nasal spray Place 1  spray into alternate nostrils daily.    . Carboxymethylcellul-Glycerin (LUBRICATING EYE DROPS OP) Place 1 drop into both eyes daily.    . carvedilol (COREG) 25 MG tablet TAKE 1 TABLET(25 MG) BY MOUTH TWICE DAILY WITH A MEAL 180 tablet 0  . cephALEXin (KEFLEX) 250 MG capsule Take 1 capsule (250 mg total) by mouth 2 (two) times daily. 14 capsule 0  . cetirizine (ZYRTEC) 10 MG tablet Take 10 mg by mouth daily as needed for allergies.    . chlorthalidone (HYGROTON) 25 MG tablet Take 1 tablet (25 mg total) by mouth daily. 90 tablet 1  . Cholecalciferol (VITAMIN D) 50 MCG (2000 UT) CAPS Take 2,000 Units by mouth daily.    Marland Kitchen conjugated estrogens (PREMARIN) vaginal cream Place 1 applicator vaginally daily as needed (dryness / burning).    . cyclobenzaprine (FLEXERIL) 10 MG tablet Take 1 tablet (10 mg total) by mouth 3 (three) times daily as needed for muscle spasms. 30 tablet 2  . diclofenac Sodium (VOLTAREN) 1 % GEL Apply 1 Application topically 4 (four) times daily as needed (pain).    Dorise Hiss (AIMOVIG) 140 MG/ML SOAJ Inject 140 mg as directed every 30 (thirty) days.    . famotidine (PEPCID) 40 MG tablet TAKE 1 TABLET(40 MG) BY MOUTH AT BEDTIME (Patient taking differently: Take 40 mg by mouth at bedtime.) 90 tablet 3  . FLUoxetine (PROZAC) 20 MG capsule Take 2 capsules (40 mg total) by mouth daily. 180 capsule 0  . fluticasone (FLONASE) 50 MCG/ACT nasal spray SHAKE LIQUID AND USE 2 SPRAYS IN EACH NOSTRIL DAILY (Patient taking differently: Place 2 sprays into both nostrils daily as needed for allergies.) 16 g 6  . Fluticasone-Umeclidin-Vilant (TRELEGY ELLIPTA) 100-62.5-25 MCG/ACT AEPB Inhale 1 puff into the lungs daily. 3 each 3  . hyoscyamine (LEVSIN/SL) 0.125 MG SL tablet Place 1 tablet (0.125 mg total) under the tongue every 6 (six) hours as needed. 30 tablet 0  . levothyroxine (SYNTHROID) 25 MCG tablet TAKE 1 TABLET(25 MCG) BY MOUTH DAILY 90 tablet 0  . linaclotide (LINZESS) 145 MCG CAPS  capsule Take 1 capsule (145 mcg total) by mouth daily as needed (constipation).    . memantine (NAMENDA) 10 MG tablet TAKE 1 TABLET(10 MG) BY MOUTH DAILY FOR 14 DAYS THEN TAKE 1 TABLET(10 MG) BY MOUTH TWICE DAILY FOR 16 DAYS 46 tablet 0  . montelukast (SINGULAIR) 10 MG tablet Take 1 tablet (10 mg total) by mouth at bedtime. 90 tablet 3  . OVER THE COUNTER MEDICATION Take 1 tablet by mouth in the morning. Salt Supplement    . oxyCODONE-acetaminophen (PERCOCET) 10-325 MG tablet Take 1 tablet by mouth 4 (four) times daily as needed for pain.    . pantoprazole (PROTONIX) 40 MG tablet TAKE 1 TABLET(40 MG) BY MOUTH DAILY 90 tablet  0  . potassium chloride (MICRO-K) 10 MEQ CR capsule Take 2 capsules (20 mEq total) by mouth 2 (two) times daily. 360 capsule 1  . pregabalin (LYRICA) 25 MG capsule Take 1 capsule (25 mg total) by mouth 3 (three) times daily. 90 capsule 2  . pregabalin (LYRICA) 50 MG capsule Take 50 mg by mouth 2 (two) times daily as needed (anxiety).    . promethazine (PHENERGAN) 25 MG tablet TAKE 1 TABLET BY MOUTH EVERY 8 HOURS IF NEEDED 20 tablet 0  . QUEtiapine (SEROQUEL) 25 MG tablet Take 1 tablet (25 mg total) by mouth 2 (two) times daily as needed for up to 7 days. (Patient not taking: Reported on 08/23/2022) 14 tablet 0  . rizatriptan (MAXALT) 10 MG tablet Take by mouth.    . sennosides-docusate sodium (SENOKOT-S) 8.6-50 MG tablet Take 1 tablet by mouth daily. 30 tablet 3  . vitamin B-12 (CYANOCOBALAMIN) 500 MCG tablet Take 500 mcg by mouth daily at 12 noon.     No current facility-administered medications for this visit.    ALLERGIES:   Allergies  Allergen Reactions  . Erythromycin Base Diarrhea  . Vfend [Voriconazole] Other (See Comments)    Visual changes  . Penicillins Rash    FAMILY HISTORY:   Family History  Problem Relation Age of Onset  . Breast cancer Mother   . Dementia Mother   . Heart disease Mother   . Hypertension Mother   . Thyroid disease Mother   .  Alzheimer's disease Father   . Heart disease Father   . Hypertension Father   . Thyroid disease Father   . Diabetes Sister   . Colon cancer Neg Hx   . Rectal cancer Neg Hx   . Stomach cancer Neg Hx   . Colon polyps Neg Hx   . Esophageal cancer Neg Hx     SOCIAL HISTORY:  The patient was born in [place].  She lives in town.  She is twice divorced, with 1 child.  She did previous clerical work.  She has smoked as much as 1-2 packs of cigarettes daily for the past 50 years.  She denies alcohol abuse.    REVIEW OF SYSTEMS:  Review of Systems  Constitutional:  Positive for unexpected weight change. Negative for fatigue and fever.  HENT:   Positive for hearing loss. Negative for sore throat.   Eyes:  Negative for eye problems.  Respiratory:  Positive for shortness of breath. Negative for chest tightness, cough and hemoptysis.   Cardiovascular:  Negative for chest pain and palpitations.  Gastrointestinal:  Positive for constipation. Negative for abdominal distention, abdominal pain, blood in stool, diarrhea, nausea and vomiting.  Endocrine: Negative for hot flashes.  Genitourinary:  Negative for difficulty urinating, dysuria, frequency, hematuria and nocturia.   Musculoskeletal:  Positive for arthralgias, back pain and gait problem. Negative for myalgias.  Skin: Negative.  Negative for itching and rash.  Neurological:  Positive for gait problem and headaches. Negative for dizziness, extremity weakness, light-headedness and numbness.  Hematological: Negative.   Psychiatric/Behavioral:  Positive for depression. Negative for suicidal ideas. The patient is not nervous/anxious.     PHYSICAL EXAM:  There were no vitals taken for this visit. Wt Readings from Last 3 Encounters:  10/31/22 99 lb (44.9 kg)  10/23/22 99 lb 11.2 oz (45.2 kg)  10/11/22 100 lb (45.4 kg)   There is no height or weight on file to calculate BMI. Performance status (ECOG): 1 - Symptomatic but completely  ambulatory  Physical Exam Constitutional:      Appearance: Normal appearance. She is not ill-appearing.  HENT:     Mouth/Throat:     Mouth: Mucous membranes are moist.     Pharynx: Oropharynx is clear. No oropharyngeal exudate or posterior oropharyngeal erythema.  Cardiovascular:     Rate and Rhythm: Normal rate and regular rhythm.     Heart sounds: No murmur heard.    No friction rub. No gallop.  Pulmonary:     Effort: Pulmonary effort is normal. No respiratory distress.     Breath sounds: Normal breath sounds. No wheezing, rhonchi or rales.  Abdominal:     General: Bowel sounds are normal. There is no distension.     Palpations: Abdomen is soft. There is no mass.     Tenderness: There is no abdominal tenderness.  Musculoskeletal:        General: No swelling.     Right lower leg: No edema.     Left lower leg: No edema.  Lymphadenopathy:     Cervical: No cervical adenopathy.     Upper Body:     Right upper body: No supraclavicular or axillary adenopathy.     Left upper body: No supraclavicular or axillary adenopathy.     Lower Body: No right inguinal adenopathy. No left inguinal adenopathy.  Skin:    General: Skin is warm.     Coloration: Skin is not jaundiced.     Findings: No lesion or rash.  Neurological:     General: No focal deficit present.     Mental Status: She is alert and oriented to person, place, and time. Mental status is at baseline.  Psychiatric:        Mood and Affect: Mood normal.        Behavior: Behavior normal.        Thought Content: Thought content normal.    LABS:       Latest Ref Rng & Units 09/27/2022   12:00 AM 09/17/2022    3:43 PM 08/08/2022    3:32 PM  CBC  WBC  10.0     12.8  12.0   Hemoglobin 12.0 - 16.0 10.8     10.5  10.9   Hematocrit 36 - 46 33     33.1  34.8   Platelets 150 - 400 K/uL 546     576  556      This result is from an external source.      Latest Ref Rng & Units 09/17/2022    3:43 PM 08/08/2022    3:32 PM 07/31/2022     3:59 AM  CMP  Glucose 70 - 99 mg/dL 161  096  84   BUN 8 - 27 mg/dL 15  7  9    Creatinine 0.57 - 1.00 mg/dL 0.45  4.09  8.11   Sodium 134 - 144 mmol/L 136  138  132   Potassium 3.5 - 5.2 mmol/L 4.1  5.2  3.3   Chloride 96 - 106 mmol/L 99  105  99   CO2 20 - 29 mmol/L 23  20  20    Calcium 8.7 - 10.3 mg/dL 9.8  8.9  8.2   Total Protein 6.0 - 8.5 g/dL 6.3  5.9    Total Bilirubin 0.0 - 1.2 mg/dL 0.3  0.3    Alkaline Phos 44 - 121 IU/L 139  209    AST 0 - 40 IU/L 10  25    ALT 0 - 32  IU/L 9  15      Latest Reference Range & Units 09/27/22 15:08  Iron 28 - 170 ug/dL 45  UIBC ug/dL 176  TIBC 160 - 737 ug/dL 106  Saturation Ratios 10.4 - 31.8 % 11  Ferritin 11 - 307 ng/mL 65   ASSESSMENT & PLAN:  A 72 y.o. female who I was asked to consult upon for abnormal counts, including anemia and thrombocythemia.  With her history of GI blood loss with current anemia, thrombocythemia, and a declining MCV, I do believe she has iron deficiency anemia.  I will arrange for her to receive IV iron next week to replenish her iron stores and improve her hemoglobin.  As mentioned previously, she has a GI appointment next month, which she knows to keep.  Otherwise, I will see her back in 3 months to reassess her iron levels and peripheral counts to see how well they have improved after receiving IV iron.  The patient understands all the plans discussed today and is in agreement with them.  I do appreciate Cox, Kirsten, MD for his new consult.   Nyx Keady Kirby Funk, MD

## 2022-12-17 ENCOUNTER — Inpatient Hospital Stay: Payer: Medicare PPO

## 2022-12-17 ENCOUNTER — Other Ambulatory Visit: Payer: Self-pay | Admitting: Oncology

## 2022-12-17 ENCOUNTER — Inpatient Hospital Stay: Payer: Medicare PPO | Attending: Oncology | Admitting: Oncology

## 2022-12-17 ENCOUNTER — Telehealth: Payer: Self-pay | Admitting: Oncology

## 2022-12-17 VITALS — BP 124/50 | HR 71 | Temp 98.0°F | Resp 14 | Ht 60.0 in | Wt 98.5 lb

## 2022-12-17 DIAGNOSIS — Z79899 Other long term (current) drug therapy: Secondary | ICD-10-CM | POA: Diagnosis not present

## 2022-12-17 DIAGNOSIS — K5520 Angiodysplasia of colon without hemorrhage: Secondary | ICD-10-CM | POA: Diagnosis not present

## 2022-12-17 DIAGNOSIS — D500 Iron deficiency anemia secondary to blood loss (chronic): Secondary | ICD-10-CM

## 2022-12-17 LAB — IRON AND TIBC
Iron: 75 ug/dL (ref 28–170)
Saturation Ratios: 18 % (ref 10.4–31.8)
TIBC: 413 ug/dL (ref 250–450)
UIBC: 338 ug/dL

## 2022-12-17 LAB — CMP (CANCER CENTER ONLY)
ALT: <5 U/L (ref 0–44)
AST: 15 U/L (ref 15–41)
Albumin: 4.2 g/dL (ref 3.5–5.0)
Alkaline Phosphatase: 61 U/L (ref 38–126)
Anion gap: 12 (ref 5–15)
BUN: 15 mg/dL (ref 8–23)
CO2: 26 mmol/L (ref 22–32)
Calcium: 10.2 mg/dL (ref 8.9–10.3)
Chloride: 101 mmol/L (ref 98–111)
Creatinine: 0.79 mg/dL (ref 0.44–1.00)
GFR, Estimated: >60 mL/min (ref >60)
Glucose, Bld: 90 mg/dL (ref 70–99)
Potassium: 3.9 mmol/L (ref 3.5–5.1)
Sodium: 139 mmol/L (ref 135–145)
Total Bilirubin: 0.4 mg/dL (ref <1.2)
Total Protein: 6.7 g/dL (ref 6.5–8.1)

## 2022-12-17 LAB — CBC WITH DIFFERENTIAL (CANCER CENTER ONLY)
Abs Immature Granulocytes: 0.02 10*3/uL (ref 0.00–0.07)
Basophils Absolute: 0.1 10*3/uL (ref 0.0–0.1)
Basophils Relative: 1 %
Eosinophils Absolute: 0.4 10*3/uL (ref 0.0–0.5)
Eosinophils Relative: 6 %
HCT: 37.2 % (ref 36.0–46.0)
Hemoglobin: 12.1 g/dL (ref 12.0–15.0)
Immature Granulocytes: 0 %
Lymphocytes Relative: 40 %
Lymphs Abs: 3 10*3/uL (ref 0.7–4.0)
MCH: 28.9 pg (ref 26.0–34.0)
MCHC: 32.5 g/dL (ref 30.0–36.0)
MCV: 89 fL (ref 80.0–100.0)
Monocytes Absolute: 0.7 10*3/uL (ref 0.1–1.0)
Monocytes Relative: 9 %
Neutro Abs: 3.3 10*3/uL (ref 1.7–7.7)
Neutrophils Relative %: 44 %
Platelet Count: 333 10*3/uL (ref 150–400)
RBC: 4.18 MIL/uL (ref 3.87–5.11)
RDW: 15.9 % — ABNORMAL HIGH (ref 11.5–15.5)
WBC Count: 7.4 10*3/uL (ref 4.0–10.5)
nRBC: 0 % (ref 0.0–0.2)
nRBC: 0 /100{WBCs}

## 2022-12-17 LAB — FERRITIN: Ferritin: 337 ng/mL — ABNORMAL HIGH (ref 11–307)

## 2022-12-17 NOTE — Telephone Encounter (Signed)
12/17/22 Spoke with patient and confirmed next appt.

## 2022-12-18 ENCOUNTER — Encounter: Payer: Self-pay | Admitting: Oncology

## 2022-12-18 DIAGNOSIS — M4856XA Collapsed vertebra, not elsewhere classified, lumbar region, initial encounter for fracture: Secondary | ICD-10-CM | POA: Diagnosis not present

## 2022-12-18 DIAGNOSIS — M810 Age-related osteoporosis without current pathological fracture: Secondary | ICD-10-CM | POA: Diagnosis not present

## 2022-12-18 DIAGNOSIS — M4854XA Collapsed vertebra, not elsewhere classified, thoracic region, initial encounter for fracture: Secondary | ICD-10-CM | POA: Diagnosis not present

## 2022-12-18 DIAGNOSIS — M4854XD Collapsed vertebra, not elsewhere classified, thoracic region, subsequent encounter for fracture with routine healing: Secondary | ICD-10-CM | POA: Diagnosis not present

## 2022-12-18 DIAGNOSIS — M4856XD Collapsed vertebra, not elsewhere classified, lumbar region, subsequent encounter for fracture with routine healing: Secondary | ICD-10-CM | POA: Diagnosis not present

## 2022-12-19 ENCOUNTER — Other Ambulatory Visit: Payer: Self-pay

## 2022-12-20 DIAGNOSIS — J449 Chronic obstructive pulmonary disease, unspecified: Secondary | ICD-10-CM | POA: Diagnosis not present

## 2022-12-20 DIAGNOSIS — K219 Gastro-esophageal reflux disease without esophagitis: Secondary | ICD-10-CM | POA: Diagnosis not present

## 2022-12-20 DIAGNOSIS — R109 Unspecified abdominal pain: Secondary | ICD-10-CM | POA: Diagnosis not present

## 2022-12-20 DIAGNOSIS — K5900 Constipation, unspecified: Secondary | ICD-10-CM | POA: Diagnosis not present

## 2022-12-20 DIAGNOSIS — R1310 Dysphagia, unspecified: Secondary | ICD-10-CM | POA: Diagnosis not present

## 2022-12-20 DIAGNOSIS — R112 Nausea with vomiting, unspecified: Secondary | ICD-10-CM | POA: Diagnosis not present

## 2022-12-20 DIAGNOSIS — R634 Abnormal weight loss: Secondary | ICD-10-CM | POA: Diagnosis not present

## 2022-12-24 ENCOUNTER — Ambulatory Visit: Payer: Medicare PPO | Admitting: Family Medicine

## 2022-12-24 ENCOUNTER — Encounter: Payer: Self-pay | Admitting: Family Medicine

## 2022-12-24 VITALS — BP 110/58 | HR 79 | Temp 97.3°F | Resp 16 | Ht 60.0 in | Wt 96.8 lb

## 2022-12-24 DIAGNOSIS — Z01818 Encounter for other preprocedural examination: Secondary | ICD-10-CM | POA: Insufficient documentation

## 2022-12-24 DIAGNOSIS — S22070A Wedge compression fracture of T9-T10 vertebra, initial encounter for closed fracture: Secondary | ICD-10-CM | POA: Diagnosis not present

## 2022-12-24 DIAGNOSIS — S81811A Laceration without foreign body, right lower leg, initial encounter: Secondary | ICD-10-CM | POA: Diagnosis not present

## 2022-12-24 DIAGNOSIS — M8000XA Age-related osteoporosis with current pathological fracture, unspecified site, initial encounter for fracture: Secondary | ICD-10-CM | POA: Diagnosis not present

## 2022-12-24 DIAGNOSIS — M8008XA Age-related osteoporosis with current pathological fracture, vertebra(e), initial encounter for fracture: Secondary | ICD-10-CM | POA: Diagnosis not present

## 2022-12-24 NOTE — Assessment & Plan Note (Signed)
Worsening, with recent MRI showing another cracked rib. Patient has been asking for bone medicine for several months. -Initiate process for preauthorization for Evenity injections, pending insurance approval.

## 2022-12-24 NOTE — Progress Notes (Signed)
Subjective:  Patient ID: Allison Jimenez, female    DOB: 08/07/1950  Age: 72 y.o. MRN: [id number]  Discussed the use of AI scribe software for clinical note transcription with the patient, who gave verbal consent to proceed.    Chief Complaint  Patient presents with   Pre-op Exam    HPI  [name]  is here for a Pre-operative physical at the request of Dr. Francena Hanly.   She is having reverse shoulder arthroplasty on 01/24/23.   Personal or family hx of adverse outcome to anesthesia? No  Chipped, cracked, missing, or loose teeth? Yes  Decreased ROM of neck? No  Able to walk up 2 flights of stairs without becoming significantly short of breath or having chest pain? No   The patient's upcoming surgery is for a right shoulder replacement due to a separated bone and a shredded rotator cuff. She reports being unable to raise her arm independently and describes the condition as very limiting.  The patient, with a history of hypertension and an abdominal aortic aneurysm, is scheduled for a right shoulder replacement surgery on January 23rd. She reports a history of aneurysm of the intrafernal abdominal aorta, which is monitored with biennial ultrasounds. The most recent ultrasound was performed this year, and the patient has a follow-up appointment scheduled for April. The aneurysm is currently 4.5 cm in size, and the patient understands that surgical intervention will be necessary if it reaches 5 cm.  The patient has been cleared for surgery by pulmonology and hem/oncology and has an appointment for preoperative lab work at the hospital. She reports being able to perform self-care tasks such as eating, dressing, and using the toilet independently. However, she has difficulty with mobility due to back issues, limiting her ability to climb stairs or walk long distances.  Osteoporosis: Patient reports that she has worsening osteoporosis as reported from her DEXA scan performed on 6.13.24. Dr. Sedalia Muta  recommended Evenity injections. Patient wants to start receiving the injections as soon as her insurance approves this medication.   Skin tear sequelae: Patient states that her skin tear is healing and she attributes the increase in healing after her iron infusions.      12/24/2022   11:37 AM 09/27/2022    4:21 PM 09/17/2022    3:07 PM 01/31/2022    2:27 PM 01/25/2022   10:26 AM  Depression screen PHQ 2/9  Decreased Interest 0 1 1 0   Down, Depressed, Hopeless 0 1 0 0 2  PHQ - 2 Score 0 2 1 0 2  Altered sleeping 0  1  0  Tired, decreased energy 0  1  1  Change in appetite 0    0  Feeling bad or failure about yourself  0  0  0  Trouble concentrating 0  0  0  Moving slowly or fidgety/restless 0  0  0  Suicidal thoughts 0  0  0  PHQ-9 Score 0  3  3  Difficult doing work/chores Not difficult at all  Not difficult at all  Not difficult at all        05/22/2022   11:54 AM  Fall Risk   Falls in the past year? 1  Number falls in past yr: 1  Injury with Fall? 0  Risk for fall due to : History of fall(s);Impaired balance/gait;Impaired mobility  Follow up Falls evaluation completed    Patient Care Team: Blane Ohara, MD as PCP - General (Family Medicine) Bevelyn Ngo,  NP as Nurse Practitioner (Pulmonary Disease) Quinn Plowman, PA-C as Physician Assistant (General Practice) Misenheimer, Marcial Pacas, MD as Consulting Physician (Gastroenterology) Sinda Du, MD as Consulting Physician (Ophthalmology) Florene Glen (Physician Assistant) Omar Person, MD as Consulting Physician (Pulmonary Disease)   Review of Systems  Constitutional:  Negative for chills, diaphoresis, fatigue and fever.  HENT:  Negative for congestion, ear pain and sinus pain.   Respiratory:  Negative for cough and shortness of breath.   Cardiovascular:  Negative for chest pain.  Gastrointestinal:  Negative for abdominal pain, constipation, nausea and vomiting.  Genitourinary:  Negative for dysuria.   Musculoskeletal:  Positive for arthralgias (right shoulder).  Skin:  Positive for wound. Negative for rash.  Neurological:  Negative for weakness and headaches.  Psychiatric/Behavioral:  Negative for dysphoric mood. The patient is not nervous/anxious.     Current Outpatient Medications on File Prior to Visit  Medication Sig Dispense Refill   acetaminophen (TYLENOL) 500 MG tablet Take 500-1,000 mg by mouth every 6 (six) hours as needed for mild pain, moderate pain or headache.     albuterol (VENTOLIN HFA) 108 (90 Base) MCG/ACT inhaler Inhale 2 puffs into the lungs every 6 (six) hours as needed for shortness of breath or wheezing. 8 g 2   ALPRAZolam (XANAX) 0.25 MG tablet Take 0.5-1 tablets (0.125-0.25 mg total) by mouth daily as needed for anxiety or sleep. 30 tablet 1   atorvastatin (LIPITOR) 20 MG tablet TAKE 1 TABLET(20 MG) BY MOUTH DAILY 90 tablet 1   calcitonin, salmon, (MIACALCIN/FORTICAL) 200 UNIT/ACT nasal spray Place 1 spray into alternate nostrils daily.     Carboxymethylcellul-Glycerin (LUBRICATING EYE DROPS OP) Place 1 drop into both eyes daily.     carvedilol (COREG) 25 MG tablet TAKE 1 TABLET(25 MG) BY MOUTH TWICE DAILY WITH A MEAL 180 tablet 0   cetirizine (ZYRTEC) 10 MG tablet Take 10 mg by mouth daily as needed for allergies.     chlorthalidone (HYGROTON) 25 MG tablet Take 1 tablet (25 mg total) by mouth daily. 90 tablet 1   Cholecalciferol (VITAMIN D) 50 MCG (2000 UT) CAPS Take 2,000 Units by mouth daily.     conjugated estrogens (PREMARIN) vaginal cream Place 1 applicator vaginally daily as needed (dryness / burning).     cyclobenzaprine (FLEXERIL) 10 MG tablet Take 1 tablet (10 mg total) by mouth 3 (three) times daily as needed for muscle spasms. 30 tablet 2   diclofenac Sodium (VOLTAREN) 1 % GEL Apply 1 Application topically 4 (four) times daily as needed (pain).     Erenumab-aooe (AIMOVIG) 140 MG/ML SOAJ Inject 140 mg as directed every 30 (thirty) days.     famotidine  (PEPCID) 40 MG tablet TAKE 1 TABLET(40 MG) BY MOUTH AT BEDTIME (Patient taking differently: Take 40 mg by mouth at bedtime.) 90 tablet 3   FLUoxetine (PROZAC) 20 MG capsule Take 2 capsules (40 mg total) by mouth daily. 180 capsule 0   fluticasone (FLONASE) 50 MCG/ACT nasal spray SHAKE LIQUID AND USE 2 SPRAYS IN EACH NOSTRIL DAILY (Patient taking differently: Place 2 sprays into both nostrils daily as needed for allergies.) 16 g 6   Fluticasone-Umeclidin-Vilant (TRELEGY ELLIPTA) 100-62.5-25 MCG/ACT AEPB Inhale 1 puff into the lungs daily. 3 each 3   hyoscyamine (LEVSIN/SL) 0.125 MG SL tablet Place 1 tablet (0.125 mg total) under the tongue every 6 (six) hours as needed. 30 tablet 0   levothyroxine (SYNTHROID) 25 MCG tablet TAKE 1 TABLET(25 MCG) BY MOUTH DAILY 90 tablet 0  linaclotide (LINZESS) 145 MCG CAPS capsule Take 1 capsule (145 mcg total) by mouth daily as needed (constipation).     memantine (NAMENDA) 10 MG tablet TAKE 1 TABLET(10 MG) BY MOUTH DAILY FOR 14 DAYS THEN TAKE 1 TABLET(10 MG) BY MOUTH TWICE DAILY FOR 16 DAYS 46 tablet 0   montelukast (SINGULAIR) 10 MG tablet Take 1 tablet (10 mg total) by mouth at bedtime. 90 tablet 3   ondansetron (ZOFRAN) 4 MG tablet Take 4 mg by mouth every 6 (six) hours as needed.     OVER THE COUNTER MEDICATION Take 1 tablet by mouth in the morning. Salt Supplement     oxyCODONE-acetaminophen (PERCOCET) 10-325 MG tablet Take 1 tablet by mouth 4 (four) times daily as needed for pain.     pantoprazole (PROTONIX) 40 MG tablet TAKE 1 TABLET(40 MG) BY MOUTH DAILY 90 tablet 0   potassium chloride (MICRO-K) 10 MEQ CR capsule Take 2 capsules (20 mEq total) by mouth 2 (two) times daily. 360 capsule 1   pregabalin (LYRICA) 25 MG capsule Take 1 capsule (25 mg total) by mouth 3 (three) times daily. 90 capsule 2   promethazine (PHENERGAN) 25 MG tablet TAKE 1 TABLET BY MOUTH EVERY 8 HOURS IF NEEDED 20 tablet 0   rizatriptan (MAXALT) 10 MG tablet Take by mouth.      sennosides-docusate sodium (SENOKOT-S) 8.6-50 MG tablet Take 1 tablet by mouth daily. 30 tablet 3   vitamin B-12 (CYANOCOBALAMIN) 500 MCG tablet Take 500 mcg by mouth daily at 12 noon.     No current facility-administered medications on file prior to visit.   Past Medical History:  Diagnosis Date   Acute blood loss anemia (ABLA) 09/05/2020   Allergy    ANXIETY 06/05/2006   Arthritis    SHOULDERS    BACK PAIN 01/05/2010   Carotid artery occlusion    Cataract    bilateral, left worse   Centrilobular emphysema (HCC)    COPD (chronic obstructive pulmonary disease) (HCC)    Depression    DIVERTICULOSIS, COLON 06/05/2006   DIZZINESS OR VERTIGO 06/05/2006   positional   GERD (gastroesophageal reflux disease)    Hearing loss in left ear    no hearing aid   History of blood transfusion 08/2020   History of kidney stones    passed stones   HYPERLIPIDEMIA 06/05/2006   Hypertension    HYPOKALEMIA 12/14/2008   Hypothyroidism    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   Qualifier: Diagnosis of  By: Amador Cunas  MD, Janett Labella    Pneumonia    TOBACCO USER 11/25/2007   Past Surgical History:  Procedure Laterality Date   APPENDECTOMY     bilateral cataract surgery      BRONCHIAL BIOPSY  09/28/2021   Procedure: BRONCHIAL BIOPSIES;  Surgeon: Omar Person, MD;  Location: [facility] ENDOSCOPY;  Service: Pulmonary;;   BRONCHIAL NEEDLE ASPIRATION BIOPSY  09/28/2021   Procedure: BRONCHIAL NEEDLE ASPIRATION BIOPSIES;  Surgeon: Omar Person, MD;  Location: [facility] ENDOSCOPY;  Service: Pulmonary;;   BRONCHIAL WASHINGS  09/28/2021   Procedure: BRONCHIAL WASHINGS;  Surgeon: Omar Person, MD;  Location: [facility] ENDOSCOPY;  Service: Pulmonary;;   CESAREAN SECTION     x1   CHOLECYSTECTOMY     COLONOSCOPY     COSMETIC SURGERY     CYSTOSCOPY W/ URETERAL STENT PLACEMENT Right 07/29/2022   Procedure: CYSTOSCOPY WITH RETROGRADE PYELOGRAM/URETERAL STENT PLACEMENT;  Surgeon: Malen Gauze, MD;  Location: [facility]  ORS;  Service: Urology;  Laterality: Right;  CYSTOSCOPY/URETEROSCOPY/HOLMIUM LASER/STENT PLACEMENT Right 09/05/2022   Procedure: RIGHT URETEROSCOPY/HOLMIUM LASER/STENT PLACEMENT;  Surgeon: Despina Arias, MD;  Location: [facility] ORS;  Service: Urology;  Laterality: Right;   mastoid surgery  1992   shunt in mastoid- endolymphatic sac in left ear    MRI  07/11/2020   x several, last one located in CE   TUBAL LIGATION     UPPER GI ENDOSCOPY     vocal cord surgery     nodule x2    Family History  Problem Relation Age of Onset   Breast cancer Mother    Dementia Mother    Heart disease Mother    Hypertension Mother    Thyroid disease Mother    Alzheimer's disease Father    Heart disease Father    Hypertension Father    Thyroid disease Father    Diabetes Sister    Colon cancer Neg Hx    Rectal cancer Neg Hx    Stomach cancer Neg Hx    Colon polyps Neg Hx    Esophageal cancer Neg Hx    Social History   Socioeconomic History   Marital status: Divorced    Spouse name: Not on file   Number of children: 1   Years of education: 13   Highest education level: Not on file  Occupational History   Occupation: ASSISTANT CREDIT MANAGER    Employer: [employer]    Comment: Retired  Tobacco Use   Smoking status: Every Day    Current packs/day: 0.50    Average packs/day: 0.5 packs/day for 49.0 years (24.5 ttl pk-yrs)    Types: Cigarettes   Smokeless tobacco: Never   Tobacco comments:    3/4 ppd 08/30/21 Vernie Murders, CMA  Vaping Use   Vaping status: Former   Start date: 01/01/2010   Quit date: 08/01/2021   Substances: Nicotine   Devices: menthol/fruit flavor  Substance and Sexual Activity   Alcohol use: No    Alcohol/week: 0.0 standard drinks of alcohol   Drug use: No   Sexual activity: Not Currently    Birth control/protection: Post-menopausal  Other Topics Concern   Not on file  Social History Narrative   Patient lives at home alone. Patient has a Garment/textile technologist. Patient is divorced .   Patient is retired.   Education high school.   Right handed.   Caffeine none.   Social Drivers of Corporate investment banker Strain: Low Risk  (10/31/2022)   Overall Financial Resource Strain (CARDIA)    Difficulty of Paying Living Expenses: Not hard at all  Food Insecurity: No Food Insecurity (09/27/2022)   Hunger Vital Sign    Worried About Running Out of Food in the Last Year: Never true    Ran Out of Food in the Last Year: Never true  Transportation Needs: No Transportation Needs (09/27/2022)   PRAPARE - Administrator, Civil Service (Medical): No    Lack of Transportation (Non-Medical): No  Physical Activity: Inactive (10/31/2022)   Exercise Vital Sign    Days of Exercise per Week: 0 days    Minutes of Exercise per Session: 0 min  Stress: No Stress Concern Present (10/31/2022)   [employer] of Occupational Health - Occupational Stress Questionnaire    Feeling of Stress : Not at all  Social Connections: Socially Isolated (10/31/2022)   Social Connection and Isolation Panel [NHANES]    Frequency of Communication with Friends and Family: Three times a week  Frequency of Social Gatherings with Friends and Family: Three times a week    Attends Religious Services: Never    Active Member of Clubs or Organizations: No    Attends Engineer, structural: Never    Marital Status: Divorced    Objective:  BP (!) 110/58 (BP Location: Left Arm, Patient Position: Sitting, Cuff Size: Small)   Pulse 79   Temp (!) 97.3 F (36.3 C) (Temporal)   Resp 16   Ht 5' (1.524 m)   Wt 96 lb 12.8 oz (43.9 kg)   SpO2 97%   BMI 18.90 kg/m      12/24/2022   11:30 AM 12/17/2022    3:42 PM 10/31/2022    1:32 PM  BP/Weight  Systolic BP 110 124 128  Diastolic BP 58 50 74  Wt. (Lbs) 96.8 98.5 99  BMI 18.9 kg/m2 19.24 kg/m2 19.33 kg/m2    Physical Exam Constitutional:      General: She is not in acute distress.    Appearance:  Normal appearance.  Eyes:     Conjunctiva/sclera: Conjunctivae normal.  Neck:     Vascular: No carotid bruit.  Cardiovascular:     Rate and Rhythm: Normal rate and regular rhythm.     Heart sounds: Normal heart sounds.  Pulmonary:     Effort: Pulmonary effort is normal.     Breath sounds: Normal breath sounds. No wheezing.  Abdominal:     General: Bowel sounds are normal.     Palpations: Abdomen is soft.     Tenderness: There is no abdominal tenderness.  Musculoskeletal:     Right shoulder: Decreased range of motion.     Left shoulder: Normal.  Neurological:     Mental Status: She is alert. Mental status is at baseline.  Psychiatric:        Mood and Affect: Mood normal.        Behavior: Behavior normal.       Lab Results  Component Value Date   WBC 7.4 12/17/2022   HGB 12.1 12/17/2022   HCT 37.2 12/17/2022   PLT 333 12/17/2022   GLUCOSE 90 12/17/2022   CHOL 98 (L) 08/08/2022   TRIG 203 (H) 08/08/2022   HDL 41 08/08/2022   LDLDIRECT 151.9 09/13/2011   LDLCALC 25 08/08/2022   ALT <5 12/17/2022   AST 15 12/17/2022   NA 139 12/17/2022   K 3.9 12/17/2022   CL 101 12/17/2022   CREATININE 0.79 12/17/2022   BUN 15 12/17/2022   CO2 26 12/17/2022   TSH 2.640 09/17/2022   INR 1.1 07/29/2022   HGBA1C 5.6 08/08/2022      Assessment & Plan:    Encounter for preoperative assessment Assessment & Plan: The patient is low to moderate risk for cardiovascular complications for this low risk/less invasive orthopedic surgery.  METS evaluation Can you... Take care of yourself, that is, eating, dressing, bathing or using the toilet? yes 2.75 Walk indoors, such as around your house? yes 1.75 Walk a block or 2 on level ground? yes 2.75 Climb a flight of stairs or walk up a hill? no 5.50 Run a short distance? no 8.00 Do light work around the house like dusting or washing dishes? yes 2.70 Do moderate work around the house like vacuuming, sweeping floors, or carrying in  groceries? no 3.50 Do heavy work around the house like scrubbing floors, or lifting or moving heavy furniture? no 8.00 Do yardwork like raking leaves, weeding or pushing a power mower? no  4.50 Have sexual relations? no 5.25 Participate in moderate recreational activities like golf, bowling, dancing, doubles tennis, or throwing a baseball or football? no 6.00 Participate in strenuous sports like swimming, singles tennis, football, basketball or skiing? no 7.50 Total DASI score: ___9.95___ METs [(DASI score  0.43) + 9.6] / 3.5: __3.96____ The higher the DASI score, the more physically active the patient is. Patients who can achieve <4 METs have poor functional capacity, 4 to 10 METs suggest moderate functional capacity, and >10 METs suggest excellent functional capacity. The DASI questionnaire is not designed to assess very high levels of physical activity. The maximum DASI score is 58.2, which would be the equivalent of 9.89 METs.   Revised Cardiac Risk Index for Pre-Operative Risk from [email or web address]  on 12/24/2022 ** All calculations should be rechecked by clinician prior to use **  RESULT SUMMARY: 0 points RCRI Score  3.9 % Risk of major cardiac event   INPUTS: High-risk surgery --> 0 = No History of ischemic heart disease --> 0 = No History of congestive heart failure --> 0 = No History of cerebrovascular disease --> 0 = No Pre-operative treatment with insulin --> 0 = No Pre-operative creatinine >2 mg/dL / 409.8 mol/L --> 0 = No   Gupta Perioperative Risk for Myocardial Infarction or Cardiac Arrest (MICA) from [email or web address]  on 12/24/2022 ** All calculations should be rechecked by clinician prior to use **  RESULT SUMMARY: 0.7 % Risk of myocardial infarction or cardiac arrest, intraoperatively or up to 30 days post-op   INPUTS: Age --> 72 years Functional status --> 0 = Independent ASA class --> -1.92 = 3: severe systemic disease Creatinine --> 0 = Normal (<=1.5 mg/dL,  119 mol/L)       No orders of the defined types were placed in this encounter.   No orders of the defined types were placed in this encounter.    Follow-up: Return in about 3 months (around 03/24/2023) for chronic.   I,Bradlee Heitman Janeece Fitting as a scribe for Renne Crigler, FNP.,have documented all relevant documentation on the behalf of Renne Crigler, FNP,as directed by  Renne Crigler, FNP while in the presence of Renne Crigler, FNP.   An After Visit Summary was printed and given to the patient.  Total time spent on today's visit was 42 minutes, including both face-to-face time and nonface-to-face time personally spent on review of chart (labs and imaging), discussing labs and goals, discussing further work-up, treatment options, referrals to specialist if needed, reviewing outside records if pertinent, answering patient's questions, and coordinating care.    Lajuana Matte, FNP [facility] [phone]

## 2022-12-24 NOTE — Assessment & Plan Note (Addendum)
Improved, healing Will continue to monitor

## 2022-12-24 NOTE — Assessment & Plan Note (Addendum)
The patient is low to moderate risk for cardiovascular complications for this low risk/less invasive orthopedic surgery.  METS evaluation Can you... Take care of yourself, that is, eating, dressing, bathing or using the toilet? yes 2.75 Walk indoors, such as around your house? yes 1.75 Walk a block or 2 on level ground? yes 2.75 Climb a flight of stairs or walk up a hill? no 5.50 Run a short distance? no 8.00 Do light work around the house like dusting or washing dishes? yes 2.70 Do moderate work around the house like vacuuming, sweeping floors, or carrying in groceries? no 3.50 Do heavy work around the house like scrubbing floors, or lifting or moving heavy furniture? no 8.00 Do yardwork like raking leaves, weeding or pushing a power mower? no 4.50 Have sexual relations? no 5.25 Participate in moderate recreational activities like golf, bowling, dancing, doubles tennis, or throwing a baseball or football? no 6.00 Participate in strenuous sports like swimming, singles tennis, football, basketball or skiing? no 7.50 Total DASI score: ___9.95___ METs [(DASI score  0.43) + 9.6] / 3.5: __3.96____ The higher the DASI score, the more physically active the patient is. Patients who can achieve <4 METs have poor functional capacity, 4 to 10 METs suggest moderate functional capacity, and >10 METs suggest excellent functional capacity. The DASI questionnaire is not designed to assess very high levels of physical activity. The maximum DASI score is 58.2, which would be the equivalent of 9.89 METs.   Revised Cardiac Risk Index for Pre-Operative Risk from [email or web address]  on 12/24/2022 ** All calculations should be rechecked by clinician prior to use **  RESULT SUMMARY: 0 points RCRI Score  3.9 % Risk of major cardiac event   INPUTS: High-risk surgery --> 0 = No History of ischemic heart disease --> 0 = No History of congestive heart failure --> 0 = No History of cerebrovascular disease  --> 0 = No Pre-operative treatment with insulin --> 0 = No Pre-operative creatinine >2 mg/dL / 409.8 mol/L --> 0 = No   Gupta Perioperative Risk for Myocardial Infarction or Cardiac Arrest (MICA) from [email or web address]  on 12/24/2022 ** All calculations should be rechecked by clinician prior to use **  RESULT SUMMARY: 0.7 % Risk of myocardial infarction or cardiac arrest, intraoperatively or up to 30 days post-op   INPUTS: Age --> 72 years Functional status --> 0 = Independent ASA class --> -1.92 = 3: severe systemic disease Creatinine --> 0 = Normal (<=1.5 mg/dL, 119 mol/L)

## 2022-12-27 ENCOUNTER — Ambulatory Visit: Payer: Medicare PPO | Admitting: Family Medicine

## 2022-12-28 ENCOUNTER — Ambulatory Visit: Payer: Medicare PPO | Admitting: Family Medicine

## 2022-12-28 ENCOUNTER — Telehealth: Payer: Self-pay

## 2022-12-28 NOTE — Telephone Encounter (Signed)
Patient information sent thru [employer] Portal to verify 2025 benefits for Evenity.  Awaiting reply.

## 2023-01-08 ENCOUNTER — Other Ambulatory Visit: Payer: Self-pay

## 2023-01-08 NOTE — Telephone Encounter (Signed)
 Evenity not on formulary - [employer] benefits verification submitted, awaiting results.

## 2023-01-09 ENCOUNTER — Encounter (HOSPITAL_COMMUNITY): Payer: Self-pay

## 2023-01-09 NOTE — Patient Instructions (Addendum)
 SURGICAL WAITING ROOM VISITATION Patients having surgery or a procedure may have no more than 2 support people in the waiting area - these visitors may rotate in the visitor waiting room.   Due to an increase in RSV and influenza rates and associated hospitalizations, children ages 23 and under may not visit patients in [facility] hospitals. If the patient needs to stay at the hospital during part of their recovery, the visitor guidelines for inpatient rooms apply.  PRE-OP VISITATION  Pre-op nurse will coordinate an appropriate time for 1 support person to accompany the patient in pre-op.  This support person may not rotate.  This visitor will be contacted when the time is appropriate for the visitor to come back in the pre-op area.  Please refer to the [facility] website for the visitor guidelines for Inpatients (after your surgery is over and you are in a regular room).  You are not required to quarantine at this time prior to your surgery. However, you must do this: Hand Hygiene often Do NOT share personal items Notify your provider if you are in close contact with someone who has COVID or you develop fever 100.4 or greater, new onset of sneezing, cough, sore throat, shortness of breath or body aches.  If you test positive for Covid or have been in contact with anyone that has tested positive in the last 10 days please notify you surgeon.    Your procedure is scheduled on:  THURSDAY  January 24, 2023  Report to [facility] Main Entrance: [place] entrance where the [employer] is available.   Report to admitting at:  05:15   AM  Call this number if you have any questions or problems the morning of surgery [phone]  Do not eat food after Midnight the night prior to your surgery/procedure.  After Midnight you may have the following liquids until   04:30 AM DAY OF SURGERY  Clear Liquid Diet Water  Black Coffee (sugar ok, NO MILK/CREAM OR CREAMERS)  Tea (sugar ok, NO  MILK/CREAM OR CREAMERS) regular and decaf                             Plain Jell-O  with no fruit (NO RED)                                           Fruit ices (not with fruit pulp, NO RED)                                     Popsicles (NO RED)                                                                  Juice: NO CITRUS JUICES: only apple, WHITE grape, WHITE cranberry Sports drinks like Gatorade or Powerade (NO RED)                  . The day of surgery:  Drink ONE (1) Pre-Surgery  G2 at  04:30 AM the morning of surgery.  Drink in one sitting. Do not sip.  This drink was given to you during your hospital pre-op appointment visit. Nothing else to drink after completing the Pre-Surgery G2 : No candy, chewing gum or throat lozenges.    FOLLOW ANY ADDITIONAL PRE OP INSTRUCTIONS YOU RECEIVED FROM YOUR SURGEON'S OFFICE!!!   Oral Hygiene is also important to reduce your risk of infection.        Remember - BRUSH YOUR TEETH THE MORNING OF SURGERY WITH YOUR REGULAR TOOTHPASTE  Do NOT smoke after Midnight the night before surgery.  STOP TAKING all Vitamins, Herbs and supplements 1 week before your surgery.   Take ONLY these medicines the morning of surgery with A SIP OF WATER : Pantopazole, levothyroxine , Fluoxetine , carvedilol .  You may take EITHER Tylenol  OR Percocet depending on your level of pain.  You may use your Eye drops, nasal spray and inhalers if needed.                     You may not have any metal on your body including hair pins, jewelry, and body piercing  Do not wear make-up, lotions, powders, perfumes or deodorant  Do not wear nail polish including gel and S&S, artificial / acrylic nails, or any other type of covering on natural nails including finger and toenails. If you have artificial nails, gel coating, etc., that needs to be removed by a nail salon, Please have this removed prior to surgery. Not doing so may mean that your surgery could be cancelled or delayed if the  Surgeon or anesthesia staff feels like they are unable to monitor you safely.   Do not shave 48 hours prior to surgery to avoid nicks in your skin which may contribute to postoperative infections.   Contacts, Hearing Aids, dentures or bridgework may not be worn into surgery. DENTURES WILL BE REMOVED PRIOR TO SURGERY PLEASE DO NOT APPLY Poly grip OR ADHESIVES!!!  You may bring a small overnight bag with you on the day of surgery, only pack items that are not valuable. [facility] IS NOT RESPONSIBLE   FOR VALUABLES THAT ARE LOST OR STOLEN.   Patients discharged on the day of surgery will not be allowed to drive home.  Someone NEEDS to stay with you for the first 24 hours after anesthesia.  Do not bring your home medications to the hospital EXCEPT BRING YOUR ALBUTEROL  INHALER.  The Pharmacy will dispense medications listed on your medication list to you during your admission in the Hospital.   Please read over the following fact sheets you were given: IF YOU HAVE QUESTIONS ABOUT YOUR PRE-OP INSTRUCTIONS, PLEASE CALL [phone]   KAY Tanita Palinkas, RN     Pre-operative 5 CHG Bath Instructions   You can play a key role in reducing the risk of infection after surgery. Your skin needs to be as free of germs as possible. You can reduce the number of germs on your skin by washing with CHG (chlorhexidine  gluconate) soap before surgery. CHG is an antiseptic soap that kills germs and continues to kill germs even after washing.   DO NOT use if you have an allergy to chlorhexidine /CHG or antibacterial soaps. If your skin becomes reddened or irritated, stop using the CHG and notify one of our RNs at [phone]  Please shower with the CHG soap starting 4 days before surgery using the following schedule: START SHOWERS ON   SUNDAY   January 20, 2023  Please keep in mind the following:  DO NOT shave, including legs and underarms, starting the day of your first shower.   You may shave your face at any point before/day of surgery.   Place clean sheets on your bed the day you start using CHG soap. Use a clean washcloth (not used since being washed) for each shower. DO NOT sleep with pets once you start using the CHG.   CHG Shower Instructions:  If you choose to wash your hair and private area, wash first with your normal shampoo/soap.  After you use shampoo/soap, rinse your hair and body thoroughly to remove shampoo/soap residue.  Turn the water  OFF and apply about 3 tablespoons (45 ml) of CHG soap to a CLEAN washcloth.  Apply CHG soap ONLY FROM YOUR NECK DOWN TO YOUR TOES (washing for 3-5 minutes)  DO NOT use CHG soap on face, private areas, open wounds, or sores.  Pay special attention to the area where your surgery is being performed.  If you are having back surgery, having someone wash your back for you may be helpful.  Wait 2 minutes after CHG soap is applied, then you may rinse off the CHG soap.  Pat dry with a clean towel  Put on clean clothes/pajamas   If you choose to wear lotion, please use ONLY the CHG-compatible lotions on the back of this paper.     Additional instructions for the day of surgery: DO NOT APPLY any lotions, deodorants, cologne, or perfumes.   Put on clean/comfortable clothes.  Brush your teeth.  Ask your nurse before applying any prescription medications to the skin.      CHG Compatible Lotions   Aveeno Moisturizing lotion  Cetaphil Moisturizing Cream  Cetaphil Moisturizing Lotion  Clairol Herbal Essence Moisturizing Lotion, Dry Skin  Clairol Herbal Essence Moisturizing Lotion, Extra Dry Skin  Clairol Herbal Essence Moisturizing Lotion, Normal Skin  Curel Age Defying Therapeutic Moisturizing Lotion with Alpha Hydroxy  Curel Extreme Care Body Lotion  Curel Soothing Hands Moisturizing  Hand Lotion  Curel Therapeutic Moisturizing Cream, Fragrance-Free  Curel Therapeutic Moisturizing Lotion, Fragrance-Free  Curel Therapeutic Moisturizing Lotion, Original Formula  Eucerin Daily Replenishing Lotion  Eucerin Dry Skin Therapy Plus Alpha Hydroxy Crme  Eucerin Dry Skin Therapy Plus Alpha Hydroxy Lotion  Eucerin Original Crme  Eucerin Original Lotion  Eucerin Plus Crme Eucerin Plus Lotion  Eucerin TriLipid Replenishing Lotion  Keri Anti-Bacterial Hand Lotion  Keri Deep Conditioning Original Lotion Dry Skin Formula Softly Scented  Keri Deep Conditioning Original Lotion, Fragrance Free Sensitive Skin Formula  Keri Lotion Fast Absorbing Fragrance Free Sensitive Skin Formula  Keri Lotion Fast Absorbing Softly Scented Dry Skin Formula  Keri Original Lotion  Keri Skin Renewal Lotion Keri Silky Smooth Lotion  Keri Silky Smooth Sensitive Skin Lotion  Nivea Body Creamy Conditioning Oil  Nivea Body Extra Enriched Lotion  Nivea Body Original Lotion  Nivea Body Sheer Moisturizing Lotion Nivea Crme  Nivea Skin Firming Lotion  NutraDerm 30 Skin Lotion  NutraDerm Skin Lotion  NutraDerm Therapeutic Skin Cream  NutraDerm Therapeutic Skin Lotion  ProShield Protective Hand Cream  Provon moisturizing lotion     Preparing for Total Shoulder Arthroplasty ================================================================= Please follow these instructions carefully, in addition to any other special Bathing information that was explained to you at the Presurgical Appointment:  BENZOYL PEROXIDE 5% GEL: Used to kill bacteria on the skin which could cause an infection at the surgery site.   Please do not use if you have an allergy  to benzoyl peroxide. If your skin becomes reddened/irritated stop using the benzoyl peroxide and inform your Doctor.   Starting two days before surgery, apply as follows:  1. Apply benzoyl peroxide gel in the morning and at night. Apply after taking a shower. If  you are not taking a shower, clean entire shoulder front, back, and side, along with the armpit with a clean wet washcloth.  2. Place a quarter-sized dollop of the gel on your SHOULDER and rub in thoroughly, making sure to cover the front, back, and side of your shoulder, along with the armpit.   2 Days prior to Surgery     TUESDAY 01-22-2023 First Application _______ Morning Second Application _______ Night  Day Before Surgery     [facility]  01-23-2023 First Application______ Morning  On the night before surgery, wash your entire body (except hair, face and private areas) with CHG Soap. THEN, rub in the LAST application of the Benzoyl Peroxide Gel on your shoulder.   3. On the Morning of Surgery wash your BODY AGAIN with CHG Soap (except hair, face and private areas)  4. DO NOT USE THE BENZOYL PEROXIDE GEL ON THE DAY OF YOUR SURGERY       FAILURE TO FOLLOW THESE INSTRUCTIONS MAY RESULT IN THE CANCELLATION OF YOUR SURGERY  PATIENT SIGNATURE_________________________________  NURSE SIGNATURE__________________________________  ________________________________________________________________________       Nasario Exon    An incentive spirometer is a tool that can help keep your lungs clear and active. This tool measures how well you are filling your lungs with each breath. Taking long deep breaths may help reverse or decrease the chance of developing breathing (pulmonary) problems (especially infection) following: A long period of time when you are unable to move or be active. BEFORE THE PROCEDURE  If the spirometer includes an indicator to show your best effort, your nurse or respiratory therapist will set it to a desired goal. If possible, sit up straight or lean slightly forward. Try not to slouch. Hold the incentive spirometer in an upright position. INSTRUCTIONS FOR USE  Sit on the edge of your bed if possible, or sit up as far as you can in bed or on a chair. Hold  the incentive spirometer in an upright position. Breathe out normally. Place the mouthpiece in your mouth and seal your lips tightly around it. Breathe in slowly and as deeply as possible, raising the piston or the ball toward the top of the column. Hold your breath for 3-5 seconds or for as long as possible. Allow the piston or ball to fall to the bottom of the column. Remove the mouthpiece from your mouth and breathe out normally. Rest for a few seconds and repeat Steps 1 through 7 at least 10 times every 1-2 hours when you are awake. Take your time and take a few normal breaths between deep breaths. The spirometer may include an indicator to show your best effort. Use the indicator as a goal to work toward during each repetition. After each set of 10 deep breaths, practice coughing to be sure your lungs are clear. If you have an incision (the cut made at the time of surgery), support your incision when coughing by placing a pillow or rolled up towels firmly against it. Once you are able to get out of bed, walk around indoors and cough well. You may stop using the incentive spirometer when instructed by your caregiver.  RISKS AND COMPLICATIONS Take your time so you do not get dizzy  or light-headed. If you are in pain, you may need to take or ask for pain medication before doing incentive spirometry. It is harder to take a deep breath if you are having pain. AFTER USE Rest and breathe slowly and easily. It can be helpful to keep track of a log of your progress. Your caregiver can provide you with a simple table to help with this. If you are using the spirometer at home, follow these instructions: SEEK MEDICAL CARE IF:  You are having difficultly using the spirometer. You have trouble using the spirometer as often as instructed. Your pain medication is not giving enough relief while using the spirometer. You develop fever of 100.5 F (38.1 C) or higher.                                                                                                     SEEK IMMEDIATE MEDICAL CARE IF:  You cough up bloody sputum that had not been present before. You develop fever of 102 F (38.9 C) or greater. You develop worsening pain at or near the incision site. MAKE SURE YOU:  Understand these instructions. Will watch your condition. Will get help right away if you are not doing well or get worse. Document Released: 04/30/2006 Document Revised: 03/12/2011 Document Reviewed: 07/01/2006 [facility] Patient Information 2014 [place], [place].

## 2023-01-09 NOTE — Progress Notes (Addendum)
 PCP - Dr. Josette Free    Harrie Cedar, FNP Clearance in Media 12-24-22 with LOV note in epic  Cardiologist -  Pulm- LOV 05-23-22 Leonor Craft, MD  PPM/ICD -  Device Orders -  Rep Notified -   Chest x-ray - 07-08-22 epic EKG - 07-30-22 epic Stress Test -  ECHO - 2021 epic Cardiac Cath -  AAA duplex 10-10-22 epic  Sleep Study -  CPAP -   Fasting Blood Sugar -  Checks Blood Sugar _____ times a day  Blood Thinner Instructions: Aspirin Instructions:  ERAS Protcol - PRE-SURGERY Ensure or G2-    COVID vaccine -  Activity-- Anesthesia review: AAA,HTN, COPD  Patient denies shortness of breath, fever, cough and chest pain at PAT appointment   All instructions explained to the patient, with a verbal understanding of the material. Patient agrees to go over the instructions while at home for a better understanding. Patient also instructed to self quarantine after being tested for COVID-19. The opportunity to ask questions was provided.

## 2023-01-13 NOTE — Progress Notes (Addendum)
 COVID Vaccine received:  []  No [x]  Yes Date of any COVID positive Test in last 90 days:  PCP - Dr. Josette Free    Harrie Cedar, FNP Clearance in Media 12-24-22 with LOV note in epic  Cardiologist - none Pulm- Lamar Chris, MD  Hematology- Valaria Kerns, MD  Vascular- Debby Robertson, MD   Chest x-ray - 07-28-22 epic EKG - 07-30-22 epic Stress Test -  ECHO - 2021 epic Cardiac Cath -  AAA duplex 10-10-22 epic  PCR screen: [x]  Ordered & Completed []   No Order but Needs PROFEND     []   N/A for this surgery  Surgery Plan:  [x]  Ambulatory   []  Outpatient in bed  []  Admit Anesthesia:    [x]  General  []  Spinal  []   Choice []   MAC  Pacemaker / ICD device [x]  No []  Yes   Spinal Cord Stimulator:[x]  No []  Yes       History of Sleep Apnea? [x]  No []  Yes   CPAP used?- [x]  No []  Yes    Does the patient monitor blood sugar?   []  N/A   [x]  No []  Yes  Patient has: []  NO Hx DM   [x]  Pre-DM   []  DM1  []   DM2 Last A1c was:  5.6 on   08-08-2022   Epic    Blood Thinner / Instructions:  none Aspirin Instructions:   none  ERAS Protocol Ordered: []  No  [x]  Yes PRE-SURGERY []  ENSURE  [x]  G2   Patient is to be NPO after: 04:30   Dental hx: []  Dentures:  []  N/A      []  Bridge or Partial:                   [x]  Loose or Damaged teeth:   Comments: s/p vocal cord nodules removed, Hoarseness. S/p Mastoid surgery- endolymphatic stent placed- HOH in left ear, no HAs  Patient was given the 5 CHG shower / bath instructions for Reverse Shoulder arthroplasty surgery along with 2 bottles of the CHG soap. Patient will start this on:  01-20-2023  All questions were asked and answered, Patient voiced understanding of this process.   The patient was given Benzoyl peroxide Gel as ordered. Instruction regarding application starting 2 days prior to surgery was given and patient voiced understanding.    Patient was lucid and appropriate with her answers to all my PST questions; she has mild Alzheimer's.  Her Sister, Asberry Scurry was present during this PST visit and did not correct any of her answers. She has been taking all  her medications as directed with no lapses.   Activity level: Patient is unable to climb a flight of stairs without difficulty; [x]  No CP  but would have SOB.  Patient can perform ADLs without assistance.   Anesthesia review: smoker, COPD, GERD, GAD, MDD, THN, mild Alzheimer's, Pre-DM (diet), infrarenal AAA- 4.5, Murmur (ECHO 11-25-2019)    Patient denies shortness of breath, fever, cough and chest pain at PAT appointment.  Patient verbalized understanding and agreement to the Pre-Surgical Instructions that were given to them at this PAT appointment. Patient was also educated of the need to review these PAT instructions again prior to her surgery.I reviewed the appropriate phone numbers to call if they have any and questions or concerns.

## 2023-01-14 ENCOUNTER — Encounter (HOSPITAL_COMMUNITY): Payer: Self-pay

## 2023-01-14 ENCOUNTER — Encounter (HOSPITAL_COMMUNITY)
Admission: RE | Admit: 2023-01-14 | Discharge: 2023-01-14 | Disposition: A | Discharge status: Home / Self Care | Payer: Medicare PPO | Source: Ambulatory Visit | Attending: Orthopedic Surgery | Admitting: Orthopedic Surgery

## 2023-01-14 ENCOUNTER — Other Ambulatory Visit: Payer: Self-pay

## 2023-01-14 VITALS — BP 122/68 | Temp 97.8°F | Resp 16 | Ht 60.0 in | Wt 96.0 lb

## 2023-01-14 DIAGNOSIS — Z01812 Encounter for preprocedural laboratory examination: Secondary | ICD-10-CM | POA: Insufficient documentation

## 2023-01-14 DIAGNOSIS — I152 Hypertension secondary to endocrine disorders: Secondary | ICD-10-CM | POA: Insufficient documentation

## 2023-01-14 DIAGNOSIS — E1159 Type 2 diabetes mellitus with other circulatory complications: Secondary | ICD-10-CM | POA: Diagnosis not present

## 2023-01-14 DIAGNOSIS — Z01818 Encounter for other preprocedural examination: Secondary | ICD-10-CM

## 2023-01-14 DIAGNOSIS — E559 Vitamin D deficiency, unspecified: Secondary | ICD-10-CM

## 2023-01-14 HISTORY — DX: Prediabetes: R73.03

## 2023-01-14 HISTORY — DX: Headache, unspecified: R51.9

## 2023-01-14 LAB — BASIC METABOLIC PANEL
Anion gap: 4 — ABNORMAL LOW (ref 5–15)
BUN: 14 mg/dL (ref 8–23)
CO2: 26 mmol/L (ref 22–32)
Calcium: 9.7 mg/dL (ref 8.9–10.3)
Chloride: 103 mmol/L (ref 98–111)
Creatinine, Ser: 0.56 mg/dL (ref 0.44–1.00)
GFR, Estimated: >60 mL/min (ref >60)
Glucose, Bld: 110 mg/dL — ABNORMAL HIGH (ref 70–99)
Potassium: 4 mmol/L (ref 3.5–5.1)
Sodium: 133 mmol/L — ABNORMAL LOW (ref 135–145)

## 2023-01-14 LAB — CBC
HCT: 38.4 % (ref 36.0–46.0)
Hemoglobin: 12.6 g/dL (ref 12.0–15.0)
MCH: 29.9 pg (ref 26.0–34.0)
MCHC: 32.8 g/dL (ref 30.0–36.0)
MCV: 91.2 fL (ref 80.0–100.0)
Platelets: 342 10*3/uL (ref 150–400)
RBC: 4.21 MIL/uL (ref 3.87–5.11)
RDW: 14.6 % (ref 11.5–15.5)
WBC: 8.4 10*3/uL (ref 4.0–10.5)
nRBC: 0 % (ref 0.0–0.2)

## 2023-01-14 LAB — SURGICAL PCR SCREEN
MRSA, PCR: NEGATIVE
Staphylococcus aureus: NEGATIVE

## 2023-01-15 ENCOUNTER — Other Ambulatory Visit: Payer: Self-pay

## 2023-01-15 MED ORDER — DENOSUMAB 60 MG/ML ~~LOC~~ SOSY: 60.0000 mg | PREFILLED_SYRINGE | SUBCUTANEOUS | 1 refills | Status: DC

## 2023-01-15 NOTE — Telephone Encounter (Signed)
 Prolia subject to 20% OOP Cost - spoke with patient, RX sent to [facility].

## 2023-01-16 ENCOUNTER — Other Ambulatory Visit: Payer: Self-pay | Admitting: Family Medicine

## 2023-01-16 ENCOUNTER — Ambulatory Visit: Payer: Medicare PPO

## 2023-01-16 ENCOUNTER — Telehealth: Payer: Self-pay

## 2023-01-16 DIAGNOSIS — I10 Essential (primary) hypertension: Secondary | ICD-10-CM

## 2023-01-16 NOTE — Telephone Encounter (Signed)
 Called Dr. Monico Anna office and spoke with nurse she stated patient has been tasking fenofibrate  2-3 times a day due to thinking it was a medication to help for her stomach, and she told patient to call office and make use aware incase provider wanted to get labs or see the patient.  Called patient made her aware per Cyndi Drain, PA-C: recommend patient come in for CMP to get kidney and liver function due to over taking medication. Patient will come this afternoon for labs.

## 2023-01-16 NOTE — Telephone Encounter (Signed)
 Copied from CRM [id number]. Topic: Clinical - Medication Question >> Jan 16, 2023  2:47 PM Pattie Borders B wrote: Reason for CRM: Nurse for Dr. Micki Alas called because patient advised their office that she has been incorrectly taking her RX for  fenofibrate  (TRICOR ) 145 MG tablet and would like to know if labs or a follow up is needed. [phone]

## 2023-01-17 LAB — CMP14+EGFR
ALT: 12 [IU]/L (ref 0–32)
AST: 20 [IU]/L (ref 0–40)
Albumin: 4.5 g/dL (ref 3.8–4.8)
Alkaline Phosphatase: 68 [IU]/L (ref 44–121)
BUN/Creatinine Ratio: 19 (ref 12–28)
BUN: 13 mg/dL (ref 8–27)
Bilirubin Total: 0.4 mg/dL (ref 0.0–1.2)
CO2: 21 mmol/L (ref 20–29)
Calcium: 10.1 mg/dL (ref 8.7–10.3)
Chloride: 102 mmol/L (ref 96–106)
Creatinine, Ser: 0.7 mg/dL (ref 0.57–1.00)
Globulin, Total: 2.4 g/dL (ref 1.5–4.5)
Glucose: 106 mg/dL — ABNORMAL HIGH (ref 70–99)
Potassium: 4.5 mmol/L (ref 3.5–5.2)
Sodium: 139 mmol/L (ref 134–144)
Total Protein: 6.9 g/dL (ref 6.0–8.5)
eGFR: 92 mL/min/{1.73_m2} (ref >59)

## 2023-01-18 DIAGNOSIS — S22070A Wedge compression fracture of T9-T10 vertebra, initial encounter for closed fracture: Secondary | ICD-10-CM | POA: Diagnosis not present

## 2023-01-18 DIAGNOSIS — M8008XA Age-related osteoporosis with current pathological fracture, vertebra(e), initial encounter for fracture: Secondary | ICD-10-CM | POA: Diagnosis not present

## 2023-01-21 ENCOUNTER — Encounter: Payer: Self-pay | Admitting: Nurse Practitioner

## 2023-01-21 ENCOUNTER — Ambulatory Visit: Payer: Medicare PPO | Admitting: Nurse Practitioner

## 2023-01-21 ENCOUNTER — Other Ambulatory Visit: Payer: Self-pay

## 2023-01-21 ENCOUNTER — Ambulatory Visit (INDEPENDENT_AMBULATORY_CARE_PROVIDER_SITE_OTHER): Payer: Medicare PPO

## 2023-01-21 VITALS — BP 132/58 | HR 71 | Ht <= 58 in | Wt 97.4 lb

## 2023-01-21 DIAGNOSIS — J449 Chronic obstructive pulmonary disease, unspecified: Secondary | ICD-10-CM

## 2023-01-21 DIAGNOSIS — R918 Other nonspecific abnormal finding of lung field: Secondary | ICD-10-CM

## 2023-01-21 DIAGNOSIS — I700 Atherosclerosis of aorta: Secondary | ICD-10-CM | POA: Diagnosis not present

## 2023-01-21 DIAGNOSIS — I771 Stricture of artery: Secondary | ICD-10-CM | POA: Diagnosis not present

## 2023-01-21 DIAGNOSIS — Z01811 Encounter for preprocedural respiratory examination: Secondary | ICD-10-CM | POA: Diagnosis not present

## 2023-01-21 DIAGNOSIS — F17210 Nicotine dependence, cigarettes, uncomplicated: Secondary | ICD-10-CM | POA: Diagnosis not present

## 2023-01-21 NOTE — Patient Instructions (Addendum)
Continue Albuterol inhaler 2 puffs every 6 hours as needed for shortness of breath or wheezing. Notify if symptoms persist despite rescue inhaler/neb use.  Continue cetrizine 1 tab daily for allergies Continue flonase nasal spray 2 sprays each nostril daily Continue singulair 1 tab daily Continue Trelegy 1 puff daily. Brush tongue and rinse mouth afterwards  CT chest in the next 2 weeks - someone will contact you to schedule this  Out of bed as soon as possible after surgery. Use incentive spirometer 10 times an hour while awake during the recovery period. Take your rescue inhaler with you to surgery so you have it on the way home, if you were to need it. Use your Trelegy the morning of surgery.  Chest x ray today  Follow up with Dr. Delton Coombes on 12/12 as scheduled. If symptoms worsen, please contact office for sooner follow up or seek emergency care.

## 2023-01-21 NOTE — Progress Notes (Signed)
@Patient  ID: Allison Jimenez, female    DOB: 08-25-1950, 74 y.o.   MRN: [id number]  Chief Complaint  Patient presents with   Follow-up    Referring provider: Blane Ohara, MD  HPI: 73 year old female, active smoker followed for COPD, pulmonary nodules, and chronic cavitary aspergillosis status post course of itraconazole (February 2024-May 2024).  She is a former patient of Dr. Thora Lance and last seen in office 05/23/2022.  Past medical history significant for AAA without rupture, hypertension, diabetes, hypothyroid, Alzheimer's, anxiety, depression, HLD.  TEST/EVENTS:  03/21/2021 PFT: FVC 94, FEV1 76, ratio 60, TLC 139, DLCO 59.  No BD 05/15/2022 CT chest: Atherosclerosis.  Emphysema again noted.  Areas of bandlike consolidation pulmonary nodules stable or decreased in size.  No new nodules identified.  4.2 cm abdominal aortic aneurysm, incompletely evaluated.  Stable nonobstructing right renal calculus.  Chronic spinal deformities 06/23/2022 CT chest: Atherosclerosis.  No acute airspace disease.  Stable severe emphysema.  Bilateral pulmonary nodules, most of which are stable.  There is a right upper lobe nodule increased in size now measuring 7 x 8 mm compared to 4 x 5 mm.  Increasing bandlike density in the right upper lobe previously measuring 3 x 8 mm.  Prior compression deformities.  New to subacute compression deformities of thoracic spine.  Acute to subacute right rib fracture  05/23/2022: OV with Dr. Thora Lance.  Stable nodules on surveillance imaging 05/15/2022.  Thinks she probably felt better and more energetic after being on the itraconazole for 6 weeks.  Just finished it yesterday.  No courses of prednisone since last visit.  Still smoking, down to 12 to 15 cigarettes a day.  Continue Trelegy daily.  Will need updated lung cancer screening CT in November.  Follow-up with Dr. Delton Coombes afterwards.  01/21/2023: Today-surgical clearance Discussed the use of AI scribe software for clinical note  transcription with the patient, who gave verbal consent to proceed.  History of Present Illness   The patient, with a history of COPD and emphysema, is scheduled for a right shoulder replacement surgery with Dr. Rennis Chris 01/23/2023. The patient has been experiencing a persistent cough with white or clear mucus, which is reported as normal for her. Feels like her breathing is doing well. No exacerbations requiring steroids or abx. The patient admits to continued smoking, albeit reduced, and acknowledges the potential impact on post-surgical healing.  The patient has had a tumultuous medical history over the past year, including multiple falls, a kidney stone, a urinary tract infection, and a back procedure involving cement. This period was marked by significant weight loss and a decline in mental health, leading to an increase in Prozac dosage. The patient expresses frustration over the loss of independence and freedom due to health issues. She has been starting to feel better and weight has been stable for the past few months. No hemoptysis, anorexia, night sweats.   In terms of lung health, the patient reports using albuterol infrequently, with the last use being several months ago. The patient is also on Trelegy, but admits to occasional forgetfulness in taking the medication. Despite these lapses, she doesn't have any difficulties with her breathing or flare ups.   A concern is a nodule in the right upper lobe of the lung, which was noted to have almost doubled in size within a month, as per a CT scan conducted in June. The patient was unaware of this development, having last seen [place] (Dr. Thora Lance) in May when the nodules were reported as  decreased or stable. The patient has a history of Aspergillus infection, for which she underwent a six-month treatment course.        Allergies  Allergen Reactions   Erythromycin Base Diarrhea   Vfend [Voriconazole] Other (See Comments)    Visual changes    Penicillins Rash    Immunization History  Administered Date(s) Administered   Fluad Quad(high Dose 65+) 10/15/2019, 10/17/2020, 10/23/2021   Fluad Trivalent(High Dose 65+) 09/17/2022   Influenza Split 09/20/2011   Influenza, High Dose Seasonal PF 10/28/2016   Influenza,inj,Quad PF,6+ Mos 09/23/2013, 12/12/2015   Influenza-Unspecified 10/27/2014, 10/30/2018   PFIZER(Purple Top)SARS-COV-2 Vaccination 02/08/2019, 02/28/2019, 10/15/2019   Pfizer Covid-19 Vaccine Bivalent Booster 50yrs & up 10/17/2020   Pfizer(Comirnaty)Fall Seasonal Vaccine 12 years and older 10/23/2021, 09/17/2022   Pneumococcal Conjugate-13 01/24/2017   Pneumococcal Polysaccharide-23 05/18/2010, 10/30/2018   Tdap 05/18/2010   Zoster Recombinant(Shingrix) 04/27/2016   Zoster, Live 12/21/2013    Past Medical History:  Diagnosis Date   Acute blood loss anemia (ABLA) 09/05/2020   Allergy    ANXIETY 06/05/2006   Arthritis    SHOULDERS    BACK PAIN 01/05/2010   Carotid artery occlusion    Cataract    bilateral, left worse   Centrilobular emphysema (HCC)    COPD (chronic obstructive pulmonary disease) (HCC)    Depression    DIVERTICULOSIS, COLON 06/05/2006   DIZZINESS OR VERTIGO 06/05/2006   positional   GERD (gastroesophageal reflux disease)    Headache    Migraines   Hearing loss in left ear    no hearing aid   History of blood transfusion 08/2020   History of kidney stones    passed stones   HYPERLIPIDEMIA 06/05/2006   Hypertension    HYPOKALEMIA 12/14/2008   Hypothyroidism    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   Qualifier: Diagnosis of  By: Amador Cunas  MD, Janett Labella    Pneumonia    Pre-diabetes    TOBACCO USER 11/25/2007    Tobacco History: Social History   Tobacco Use  Smoking Status Every Day   Current packs/day: 0.50   Average packs/day: 0.5 packs/day for 49.0 years (24.5 ttl pk-yrs)   Types: Cigarettes  Smokeless Tobacco Never  Tobacco Comments   3/4 ppd 08/30/21 Vernie Murders, CMA   Ready  to quit: Not Answered Counseling given: Not Answered Tobacco comments: 3/4 ppd 08/30/21 Vernie Murders, CMA   Outpatient Medications Prior to Visit  Medication Sig Dispense Refill   acetaminophen (TYLENOL) 500 MG tablet Take 500-1,000 mg by mouth every 6 (six) hours as needed for mild pain, moderate pain or headache.     albuterol (VENTOLIN HFA) 108 (90 Base) MCG/ACT inhaler Inhale 2 puffs into the lungs every 6 (six) hours as needed for shortness of breath or wheezing. 8 g 2   ALPRAZolam (XANAX) 0.25 MG tablet Take 0.5-1 tablets (0.125-0.25 mg total) by mouth daily as needed for anxiety or sleep. 30 tablet 1   atorvastatin (LIPITOR) 20 MG tablet TAKE 1 TABLET(20 MG) BY MOUTH DAILY 90 tablet 1   calcitonin, salmon, (MIACALCIN/FORTICAL) 200 UNIT/ACT nasal spray Place 1 spray into alternate nostrils daily.     Carboxymethylcellul-Glycerin (LUBRICATING EYE DROPS OP) Place 1 drop into both eyes daily.     carvedilol (COREG) 25 MG tablet TAKE 1 TABLET(25 MG) BY MOUTH TWICE DAILY WITH A MEAL 180 tablet 0   cetirizine (ZYRTEC) 10 MG tablet Take 10 mg by mouth daily as needed for allergies.     chlorthalidone (HYGROTON) 25 MG tablet  Take 1 tablet (25 mg total) by mouth daily. 90 tablet 1   Cholecalciferol (VITAMIN D) 50 MCG (2000 UT) CAPS Take 2,000 Units by mouth daily.     conjugated estrogens (PREMARIN) vaginal cream Place 1 applicator vaginally daily as needed (dryness / burning).     cyclobenzaprine (FLEXERIL) 10 MG tablet Take 1 tablet (10 mg total) by mouth 3 (three) times daily as needed for muscle spasms. 30 tablet 2   denosumab (PROLIA) 60 MG/ML SOSY injection Inject 60 mg into the skin every 6 (six) months. 1 mL 1   diclofenac Sodium (VOLTAREN) 1 % GEL Apply 1 Application topically 4 (four) times daily as needed (pain).     Erenumab-aooe (AIMOVIG) 140 MG/ML SOAJ Inject 140 mg into the skin every 30 (thirty) days.     famotidine (PEPCID) 40 MG tablet TAKE 1 TABLET(40 MG) BY MOUTH AT BEDTIME 90  tablet 3   FLUoxetine (PROZAC) 20 MG capsule Take 2 capsules (40 mg total) by mouth daily. (Patient taking differently: Take 60 mg by mouth daily.) 180 capsule 0   fluticasone (FLONASE) 50 MCG/ACT nasal spray SHAKE LIQUID AND USE 2 SPRAYS IN EACH NOSTRIL DAILY (Patient taking differently: Place 2 sprays into both nostrils daily as needed for allergies.) 16 g 6   Fluticasone-Umeclidin-Vilant (TRELEGY ELLIPTA) 100-62.5-25 MCG/ACT AEPB Inhale 1 puff into the lungs daily. 3 each 3   hyoscyamine (LEVSIN/SL) 0.125 MG SL tablet Place 1 tablet (0.125 mg total) under the tongue every 6 (six) hours as needed. 30 tablet 0   levothyroxine (SYNTHROID) 25 MCG tablet TAKE 1 TABLET(25 MCG) BY MOUTH DAILY 90 tablet 0   linaclotide (LINZESS) 145 MCG CAPS capsule Take 1 capsule (145 mcg total) by mouth daily as needed (constipation).     memantine (NAMENDA) 10 MG tablet TAKE 1 TABLET(10 MG) BY MOUTH DAILY FOR 14 DAYS THEN TAKE 1 TABLET(10 MG) BY MOUTH TWICE DAILY FOR 16 DAYS (Patient taking differently: Take 5 mg by mouth at bedtime.) 46 tablet 0   montelukast (SINGULAIR) 10 MG tablet Take 1 tablet (10 mg total) by mouth at bedtime. 90 tablet 3   ondansetron (ZOFRAN) 4 MG tablet Take 4 mg by mouth every 8 (eight) hours as needed for vomiting or nausea.     OVER THE COUNTER MEDICATION Take 1 tablet by mouth in the morning. Salt Supplement     oxyCODONE-acetaminophen (PERCOCET) 10-325 MG tablet Take 1 tablet by mouth every 8 (eight) hours as needed for pain.     pantoprazole (PROTONIX) 40 MG tablet TAKE 1 TABLET(40 MG) BY MOUTH DAILY 90 tablet 0   polyethylene glycol (MIRALAX / GLYCOLAX) 17 g packet Take 17 g by mouth daily.     potassium chloride (MICRO-K) 10 MEQ CR capsule Take 2 capsules (20 mEq total) by mouth 2 (two) times daily. 360 capsule 1   pregabalin (LYRICA) 25 MG capsule Take 1 capsule (25 mg total) by mouth 3 (three) times daily. (Patient taking differently: Take 25 mg by mouth at bedtime.) 90 capsule 2    promethazine (PHENERGAN) 25 MG tablet TAKE 1 TABLET BY MOUTH EVERY 8 HOURS IF NEEDED 20 tablet 0   rizatriptan (MAXALT) 10 MG tablet Take 10 mg by mouth as needed for migraine.     sennosides-docusate sodium (SENOKOT-S) 8.6-50 MG tablet Take 1 tablet by mouth daily. 30 tablet 3   vitamin B-12 (CYANOCOBALAMIN) 500 MCG tablet Take 500 mcg by mouth daily at 12 noon.     No facility-administered medications prior  to visit.     Review of Systems:   Constitutional: No weight loss or gain, night sweats, fevers, chills, fatigue, or lassitude. HEENT: No headaches, difficulty swallowing, tooth/dental problems, or sore throat. No sneezing, itching, ear ache +chronic nasal congestion, post nasal drip CV:  No chest pain, orthopnea, PND, swelling in lower extremities, anasarca, dizziness, palpitations, syncope Resp: +minimal shortness of breath with exertion. No excess mucus or change in color of mucus. No productive or non-productive. No hemoptysis. No wheezing.  No chest wall deformity GI:  No heartburn, indigestion, abdominal pain, nausea, vomiting, diarrhea, change in bowel habits, loss of appetite, bloody stools.  GU: No dysuria, change in color of urine, urgency or frequency.  Skin: No rash, lesions, ulcerations MSK:  +right shoulder pain, difficulties ROM right shoulder, chronic back pain Neuro: No dizziness or lightheadedness.  Psych: +improving depression, anxiety. Mood stable.     Physical Exam:  BP (!) 132/58 (BP Location: Right Arm, Patient Position: Sitting, Cuff Size: Normal)   Pulse 71   Ht 4\' 10"  (1.473 m)   Wt 97 lb 6.4 oz (44.2 kg)   SpO2 96%   BMI 20.36 kg/m   GEN: Pleasant, interactive, well-kempt; in no acute distress HEENT:  Normocephalic and atraumatic. PERRLA. Sclera white. Nasal turbinates pink, moist and patent bilaterally. No rhinorrhea present. Oropharynx pink and moist, without exudate or edema. No lesions, ulcerations, or postnasal drip.  NECK:  Supple w/ fair ROM.  No JVD present. Normal carotid impulses w/o bruits. Thyroid symmetrical with no goiter or nodules palpated. No lymphadenopathy.   CV: RRR, no m/r/g, no peripheral edema. Pulses intact, +2 bilaterally. No cyanosis, pallor or clubbing. PULMONARY:  Unlabored, regular breathing. Diminished bilaterally A&P w/o wheezes/rales/rhonchi. No accessory muscle use.  GI: BS present and normoactive. Soft, non-tender to palpation. No organomegaly or masses detected. MSK: No erythema, warmth or tenderness. Cap refil <2 sec all extrem. No deformities or joint swelling noted.  Neuro: A/Ox3. No focal deficits noted.   Skin: Warm, no lesions or rashe Psych: Normal affect and behavior. Judgement and thought content appropriate.     Lab Results:  CBC    Component Value Date/Time   WBC 8.4 01/14/2023 1354   RBC 4.21 01/14/2023 1354   HGB 12.6 01/14/2023 1354   HGB 12.1 12/17/2022 1410   HGB 10.5 (L) 09/17/2022 1543   HCT 38.4 01/14/2023 1354   HCT 33.1 (L) 09/17/2022 1543   PLT 342 01/14/2023 1354   PLT 333 12/17/2022 1410   PLT 576 (H) 09/17/2022 1543   MCV 91.2 01/14/2023 1354   MCV 88 09/17/2022 1543   MCH 29.9 01/14/2023 1354   MCHC 32.8 01/14/2023 1354   RDW 14.6 01/14/2023 1354   RDW 14.6 09/17/2022 1543   LYMPHSABS 3.0 12/17/2022 1410   LYMPHSABS 3.5 (H) 09/17/2022 1543   MONOABS 0.7 12/17/2022 1410   EOSABS 0.4 12/17/2022 1410   EOSABS 0.4 09/17/2022 1543   BASOSABS 0.1 12/17/2022 1410   BASOSABS 0.1 09/17/2022 1543    BMET    Component Value Date/Time   NA 139 01/16/2023 1608   K 4.5 01/16/2023 1608   CL 102 01/16/2023 1608   CO2 21 01/16/2023 1608   GLUCOSE 106 (H) 01/16/2023 1608   GLUCOSE 110 (H) 01/14/2023 1354   BUN 13 01/16/2023 1608   CREATININE 0.70 01/16/2023 1608   CREATININE 0.79 12/17/2022 1410   CREATININE 0.50 (L) 01/08/2022 1520   CALCIUM 10.1 01/16/2023 1608   GFRNONAA >60 01/14/2023 1354  GFRNONAA >60 12/17/2022 1410   GFRNONAA 94 03/15/2022 1321   GFRAA  103 02/15/2020 1040    BNP No results found for: "BNP"   Imaging:  DG Chest 2 View Result Date: 01/21/2023 CLINICAL DATA:  Preoperative evaluation. History of COPD. Chronic cavitary aspergillosis. EXAM: CHEST - 2 VIEW COMPARISON:  Chest radiograph 07/28/2022, CT 06/23/2022 FINDINGS: Chronic hyperinflation. Chronic bronchial thickening. Some of the pulmonary nodules on prior CT are visualized, some are not well seen. The heart is normal in size. Again seen aortic tortuosity and atherosclerosis. No pulmonary edema, pleural effusion, or pneumothorax. Exaggerated thoracic kyphosis with multilevel vertebral augmentation of lower thoracic vertebra IMPRESSION: 1. Chronic hyperinflation and bronchial thickening. 2. Some of the previous pulmonary nodules are seen by radiograph, many not well demonstrated. Electronically Signed   By: Narda Rutherford M.D.   On: 01/21/2023 16:09    Administration History     None          Latest Ref Rng & Units 03/21/2021   12:57 PM  PFT Results  FVC-Pre L 2.47   FVC-Predicted Pre % 94   FVC-Post L 2.56   FVC-Predicted Post % 97   Pre FEV1/FVC % % 61   Post FEV1/FCV % % 60   FEV1-Pre L 1.52   FEV1-Predicted Pre % 76   FEV1-Post L 1.55   DLCO uncorrected ml/min/mmHg 10.29   DLCO UNC% % 59   DLCO corrected ml/min/mmHg 10.29   DLCO COR %Predicted % 59   DLVA Predicted % 56   TLC L 6.32   TLC % Predicted % 139   RV % Predicted % 197     No results found for: "NITRICOXIDE"      Assessment & Plan:   No problem-specific Assessment & Plan notes found for this encounter. Assessment and Plan    Pulmonary Nodule Pulmonary nodule in the right upper lobe increased from 4x5 mm to 7x8 mm between May and June 2024. Likely inflammatory given timeframe. Recommendation was for 3 month follow up; however, this was not communicated to the patient and she has not followed up with [place] since prior to the scan. Differential diagnosis includes inflammation,  infection, or malignancy. Previous biopsy in 2023 of RLL nodule was negative and surveillance of this has shown stability. She does have a history of chronic Aspergillus infection treated with antifungals by ID. Follow-up CT scan needed to assess stability or further growth. - Order chest CT within the next two weeks - Attend previously scheduled follow-up appointment with Dr. Corliss Blacker on February 13, 2023  Chronic Obstructive Pulmonary Disease (COPD) and Emphysema COPD and emphysema, well-controlled with Trelegy. No recent exacerbations or hospitalizations. Reports occasional cough with white or clear mucus, no hemoptysis, stable weight, and good appetite. Emphasized the importance of consistent use of Trelegy to maintain symptom control. Aware of side effect profile. Encouraged to remain active. Action plan in place.  - Encourage consistent use of Trelegy - Continue albuterol PRN - Order chest x-ray for surgical clearance  Smoking Continues to smoke. Advised on postoperative complications with continued smoking. Has reduced smoking but finds it challenging to quit due to recent stress and depression.  - Smoking cessation advised.   Follow-up - Schedule chest CT within two weeks - Follow-up with Dr. Corliss Blacker on February 13, 2023 - Ensure chest x-ray is done today for surgical clearance.      Advised if symptoms do not improve or worsen, to please contact office for sooner follow up or seek emergency  care.    Preoperative respiratory exam Low to moderate risk. Factors that increase the risk for postoperative pulmonary complications are COPD, age, smoker  Respiratory complications generally occur in 1% of ASA Class I patients, 5% of ASA Class II and 10% of ASA Class III-IV patients These complications rarely result in mortality and include postoperative pneumonia, atelectasis, pulmonary embolism, ARDS and increased time requiring postoperative mechanical ventilation.   Overall, I recommend  proceeding with the surgery if the risk for respiratory complications are outweighed by the potential benefits. This will need to be discussed between the patient and surgeon.   To reduce risks of respiratory complications, I recommend: --Pre- and post-operative incentive spirometry performed frequently while awake --Inpatient use of currently bronchodilators  --Short duration of surgery as much as possible and avoid paralytic if possible --OOB, encourage mobility post-op   1) RISK FOR PROLONGED MECHANICAL VENTILAION - > 48h  1A) [place] - Prolonged mech ventilation risk [place] Postperative Pulmonary Risk Score - for mech ventilation dependence >48h USAA, Ann Surg 2000, major non-cardiac surgery) Comment Score  Type of surgery - abd ao aneurysm (27), thoracic (21), neurosurgery / upper abdominal / vascular (21), neck (11) Right shoulder 5  Emergency Surgery - (11)  0  ALbumin < 3 or poor nutritional state - (9)  0  BUN > 30 -  (8)  0  Partial or completely dependent functional status - (7)  0  COPD -  (6)  6  Age - 60 to 30 (4), > 70  (6)  6  TOTAL  15  Risk Stratifcation scores  - < 10 (0.5%), 11-19 (1.8%), 20-27 (4.2%), 28-40 (10.1%), >40 (26.6%)  1.8%     I spent 38 minutes of dedicated to the care of this patient on the date of this encounter to include pre-visit review of records, face-to-face time with the patient discussing conditions above, post visit ordering of testing, clinical documentation with the electronic health record, making appropriate referrals as documented, and communicating necessary findings to members of the patients care team.  Noemi Chapel, NP 01/21/2023  Pt aware and understands NP's role.

## 2023-01-23 ENCOUNTER — Other Ambulatory Visit (HOSPITAL_BASED_OUTPATIENT_CLINIC_OR_DEPARTMENT_OTHER): Payer: Medicare PPO | Admitting: Radiology

## 2023-01-23 NOTE — Anesthesia Preprocedure Evaluation (Addendum)
Anesthesia Evaluation  Patient identified by MRN, date of birth, ID band Patient awake    Reviewed: Allergy & Precautions, NPO status , Patient's Chart, lab work & pertinent test results  Airway Mallampati: I  TM Distance: >3 FB Neck ROM: Full    Dental no notable dental hx. (+) Edentulous Upper, Edentulous Lower, Dental Advisory Given   Pulmonary COPD,  COPD inhaler, Current SmokerPatient did not abstain from smoking. Hx of aspergillosis   Pulmonary exam normal breath sounds clear to auscultation       Cardiovascular hypertension, + Peripheral Vascular Disease  Normal cardiovascular exam Rhythm:Regular Rate:Normal     Neuro/Psych  PSYCHIATRIC DISORDERS Anxiety Depression       GI/Hepatic ,GERD  ,,  Endo/Other  diabetesHypothyroidism    Renal/GU Lab Results      Component                Value               Date                     K                        4.5                 01/16/2023                CREATININE               0.70                01/16/2023                    Musculoskeletal  (+) Arthritis ,    Abdominal   Peds  Hematology Lab Results      Component                Value               Date                      WBC                      8.4                 01/14/2023                HGB                      12.6                01/14/2023                HCT                      38.4                01/14/2023                MCV                      91.2                01/14/2023                PLT  342                 01/14/2023              Anesthesia Other Findings   Reproductive/Obstetrics                              Anesthesia Physical Anesthesia Plan  ASA: 3  Anesthesia Plan: General and Regional   Post-op Pain Management: Regional block* and Minimal or no pain anticipated   Induction: Intravenous  PONV Risk Score and Plan: Treatment may vary  due to age or medical condition, Ondansetron and Dexamethasone  Airway Management Planned: Oral ETT  Additional Equipment: None  Intra-op Plan:   Post-operative Plan: Extubation in OR  Informed Consent: I have reviewed the patients History and Physical, chart, labs and discussed the procedure including the risks, benefits and alternatives for the proposed anesthesia with the patient or authorized representative who has indicated his/her understanding and acceptance.     Dental advisory given  Plan Discussed with:   Anesthesia Plan Comments: (GA + R ISB 10/10)         Anesthesia Quick Evaluation

## 2023-01-24 ENCOUNTER — Ambulatory Visit (HOSPITAL_COMMUNITY)
Admission: RE | Admit: 2023-01-24 | Discharge: 2023-01-24 | Disposition: A | Discharge status: Home / Self Care | Payer: Medicare PPO | Source: Ambulatory Visit | Attending: Orthopedic Surgery | Admitting: Orthopedic Surgery

## 2023-01-24 ENCOUNTER — Other Ambulatory Visit: Payer: Self-pay

## 2023-01-24 ENCOUNTER — Ambulatory Visit (HOSPITAL_BASED_OUTPATIENT_CLINIC_OR_DEPARTMENT_OTHER): Payer: Medicare PPO | Admitting: Anesthesiology

## 2023-01-24 ENCOUNTER — Encounter (HOSPITAL_COMMUNITY)
Admission: RE | Disposition: A | Discharge status: Home / Self Care | Payer: Self-pay | Source: Ambulatory Visit | Attending: Orthopedic Surgery

## 2023-01-24 ENCOUNTER — Encounter (HOSPITAL_COMMUNITY): Payer: Self-pay | Admitting: Orthopedic Surgery

## 2023-01-24 ENCOUNTER — Telehealth: Payer: Self-pay | Admitting: Nurse Practitioner

## 2023-01-24 ENCOUNTER — Ambulatory Visit (HOSPITAL_COMMUNITY): Payer: Self-pay | Admitting: Anesthesiology

## 2023-01-24 DIAGNOSIS — I10 Essential (primary) hypertension: Secondary | ICD-10-CM | POA: Insufficient documentation

## 2023-01-24 DIAGNOSIS — M19011 Primary osteoarthritis, right shoulder: Secondary | ICD-10-CM | POA: Diagnosis not present

## 2023-01-24 DIAGNOSIS — M12811 Other specific arthropathies, not elsewhere classified, right shoulder: Secondary | ICD-10-CM

## 2023-01-24 DIAGNOSIS — M75101 Unspecified rotator cuff tear or rupture of right shoulder, not specified as traumatic: Secondary | ICD-10-CM | POA: Diagnosis not present

## 2023-01-24 DIAGNOSIS — F17210 Nicotine dependence, cigarettes, uncomplicated: Secondary | ICD-10-CM | POA: Diagnosis not present

## 2023-01-24 DIAGNOSIS — E039 Hypothyroidism, unspecified: Secondary | ICD-10-CM | POA: Diagnosis not present

## 2023-01-24 DIAGNOSIS — J432 Centrilobular emphysema: Secondary | ICD-10-CM | POA: Diagnosis not present

## 2023-01-24 DIAGNOSIS — G8918 Other acute postprocedural pain: Secondary | ICD-10-CM | POA: Diagnosis not present

## 2023-01-24 DIAGNOSIS — F419 Anxiety disorder, unspecified: Secondary | ICD-10-CM | POA: Diagnosis not present

## 2023-01-24 DIAGNOSIS — M19012 Primary osteoarthritis, left shoulder: Secondary | ICD-10-CM | POA: Insufficient documentation

## 2023-01-24 DIAGNOSIS — F32A Depression, unspecified: Secondary | ICD-10-CM | POA: Insufficient documentation

## 2023-01-24 DIAGNOSIS — Z01818 Encounter for other preprocedural examination: Secondary | ICD-10-CM

## 2023-01-24 DIAGNOSIS — E1151 Type 2 diabetes mellitus with diabetic peripheral angiopathy without gangrene: Secondary | ICD-10-CM | POA: Insufficient documentation

## 2023-01-24 DIAGNOSIS — K219 Gastro-esophageal reflux disease without esophagitis: Secondary | ICD-10-CM | POA: Insufficient documentation

## 2023-01-24 DIAGNOSIS — J449 Chronic obstructive pulmonary disease, unspecified: Secondary | ICD-10-CM | POA: Diagnosis not present

## 2023-01-24 HISTORY — PX: REVERSE SHOULDER ARTHROPLASTY: SHX5054

## 2023-01-24 SURGERY — ARTHROPLASTY, SHOULDER, TOTAL, REVERSE
Anesthesia: Regional | Site: Shoulder | Laterality: Right

## 2023-01-24 MED ORDER — HYDROMORPHONE HCL 2 MG PO TABS: 2.0000 mg | ORAL_TABLET | ORAL | 0 refills | Status: DC | PRN

## 2023-01-24 MED ORDER — FENTANYL CITRATE PF 50 MCG/ML IJ SOSY: 25.0000 ug | PREFILLED_SYRINGE | INTRAMUSCULAR | Status: DC | PRN

## 2023-01-24 MED ORDER — MIDAZOLAM HCL 2 MG/2ML IJ SOLN
1.0000 mg | INTRAMUSCULAR | Status: DC
  Filled 2023-01-24: qty 2

## 2023-01-24 MED ORDER — STERILE WATER FOR IRRIGATION IR SOLN
Status: DC | PRN
  Administered 2023-01-24: 1000 mL

## 2023-01-24 MED ORDER — ONDANSETRON HCL 4 MG/2ML IJ SOLN: 4.0000 mg | Freq: Once | INTRAMUSCULAR | Status: DC | PRN

## 2023-01-24 MED ORDER — VANCOMYCIN HCL 1000 MG IV SOLR
INTRAVENOUS | Status: AC
  Filled 2023-01-24: qty 20

## 2023-01-24 MED ORDER — SUGAMMADEX SODIUM 200 MG/2ML IV SOLN
INTRAVENOUS | Status: DC | PRN
  Administered 2023-01-24: 200 mg via INTRAVENOUS

## 2023-01-24 MED ORDER — BUPIVACAINE HCL (PF) 0.5 % IJ SOLN
INTRAMUSCULAR | Status: DC | PRN
  Administered 2023-01-24: 9 mL via PERINEURAL

## 2023-01-24 MED ORDER — PROPOFOL 10 MG/ML IV BOLUS
INTRAVENOUS | Status: DC | PRN
  Administered 2023-01-24: 90 mg via INTRAVENOUS

## 2023-01-24 MED ORDER — TRANEXAMIC ACID-NACL 1000-0.7 MG/100ML-% IV SOLN
1000.0000 mg | INTRAVENOUS | Status: AC
  Administered 2023-01-24: 1000 mg via INTRAVENOUS
  Filled 2023-01-24: qty 100

## 2023-01-24 MED ORDER — ROCURONIUM BROMIDE 100 MG/10ML IV SOLN
INTRAVENOUS | Status: DC | PRN
  Administered 2023-01-24: 35 mg via INTRAVENOUS

## 2023-01-24 MED ORDER — VANCOMYCIN HCL 1000 MG IV SOLR
INTRAVENOUS | Status: AC
Start: 2023-01-24 — End: ?
  Filled 2023-01-24: qty 20

## 2023-01-24 MED ORDER — FENTANYL CITRATE PF 50 MCG/ML IJ SOSY
50.0000 ug | PREFILLED_SYRINGE | INTRAMUSCULAR | Status: DC
  Administered 2023-01-24: 25 ug via INTRAVENOUS
  Filled 2023-01-24: qty 2

## 2023-01-24 MED ORDER — EPHEDRINE SULFATE (PRESSORS) 50 MG/ML IJ SOLN
INTRAMUSCULAR | Status: DC | PRN
  Administered 2023-01-24: 10 mg via INTRAVENOUS

## 2023-01-24 MED ORDER — ONDANSETRON HCL 4 MG/2ML IJ SOLN
INTRAMUSCULAR | Status: DC | PRN
  Administered 2023-01-24: 4 mg via INTRAVENOUS

## 2023-01-24 MED ORDER — TRANEXAMIC ACID 1000 MG/10ML IV SOLN: 1000.0000 mg | INTRAVENOUS | Status: DC

## 2023-01-24 MED ORDER — FENTANYL CITRATE (PF) 100 MCG/2ML IJ SOLN
INTRAMUSCULAR | Status: AC
  Filled 2023-01-24: qty 2

## 2023-01-24 MED ORDER — VANCOMYCIN HCL 1000 MG IV SOLR
INTRAVENOUS | Status: DC | PRN
  Administered 2023-01-24: 1000 mg via TOPICAL

## 2023-01-24 MED ORDER — DEXAMETHASONE SODIUM PHOSPHATE 10 MG/ML IJ SOLN
INTRAMUSCULAR | Status: DC | PRN
  Administered 2023-01-24: 5 mg via INTRAVENOUS

## 2023-01-24 MED ORDER — FENTANYL CITRATE (PF) 100 MCG/2ML IJ SOLN
INTRAMUSCULAR | Status: DC | PRN
  Administered 2023-01-24 (×4): 25 ug via INTRAVENOUS

## 2023-01-24 MED ORDER — PHENYLEPHRINE HCL-NACL 20-0.9 MG/250ML-% IV SOLN
INTRAVENOUS | Status: DC | PRN
Start: 2023-01-24 — End: 2023-01-24
  Administered 2023-01-24: 50 ug/min via INTRAVENOUS

## 2023-01-24 MED ORDER — GLYCOPYRROLATE 0.2 MG/ML IJ SOLN
INTRAMUSCULAR | Status: DC | PRN
  Administered 2023-01-24: .2 mg via INTRAVENOUS

## 2023-01-24 MED ORDER — LACTATED RINGERS IV SOLN: INTRAVENOUS | Status: DC

## 2023-01-24 MED ORDER — CYCLOBENZAPRINE HCL 10 MG PO TABS: 10.0000 mg | ORAL_TABLET | Freq: Three times a day (TID) | ORAL | 1 refills | Status: AC | PRN

## 2023-01-24 MED ORDER — ONDANSETRON HCL 4 MG PO TABS: 4.0000 mg | ORAL_TABLET | Freq: Three times a day (TID) | ORAL | 0 refills | Status: AC | PRN

## 2023-01-24 MED ORDER — BUPIVACAINE LIPOSOME 1.3 % IJ SUSP
INTRAMUSCULAR | Status: DC | PRN
  Administered 2023-01-24: 9 mL via PERINEURAL

## 2023-01-24 MED ORDER — CEFAZOLIN SODIUM-DEXTROSE 2-4 GM/100ML-% IV SOLN
2.0000 g | INTRAVENOUS | Status: AC
  Administered 2023-01-24: 2 g via INTRAVENOUS
  Filled 2023-01-24: qty 100

## 2023-01-24 MED ORDER — PHENYLEPHRINE HCL (PRESSORS) 10 MG/ML IV SOLN
INTRAVENOUS | Status: DC | PRN
  Administered 2023-01-24: 160 ug via INTRAVENOUS

## 2023-01-24 MED ORDER — ORAL CARE MOUTH RINSE
15.0000 mL | Freq: Once | OROMUCOSAL | Status: AC
Start: 2023-01-24 — End: 2023-01-24

## 2023-01-24 MED ORDER — 0.9 % SODIUM CHLORIDE (POUR BTL) OPTIME
TOPICAL | Status: DC | PRN
  Administered 2023-01-24: 1000 mL

## 2023-01-24 MED ORDER — CHLORHEXIDINE GLUCONATE 0.12 % MT SOLN
15.0000 mL | Freq: Once | OROMUCOSAL | Status: AC
  Administered 2023-01-24: 15 mL via OROMUCOSAL

## 2023-01-24 MED ORDER — ACETAMINOPHEN 10 MG/ML IV SOLN: 650.0000 mg | Freq: Once | INTRAVENOUS | Status: DC | PRN

## 2023-01-24 SURGICAL SUPPLY — 64 items
BAG COUNTER SPONGE SURGICOUNT (BAG) IMPLANT
BAG ZIPLOCK 12X15 (MISCELLANEOUS) ×1 IMPLANT
BIT DRILL AR 3 NS (BIT) IMPLANT
BLADE SAW SGTL 83.5X18.5 (BLADE) ×1 IMPLANT
BNDG COHESIVE 4X5 TAN STRL LF (GAUZE/BANDAGES/DRESSINGS) ×1 IMPLANT
CLSR STERI-STRIP ANTIMIC 1/2X4 (GAUZE/BANDAGES/DRESSINGS) IMPLANT
COOLER ICEMAN CLASSIC (MISCELLANEOUS) ×1 IMPLANT
COVER BACK TABLE 60X90IN (DRAPES) ×1 IMPLANT
COVER SURGICAL LIGHT HANDLE (MISCELLANEOUS) ×1 IMPLANT
CUP SUT UNIV REV NEUTRAL 33 (Cup) IMPLANT
DRAPE SHEET LG 3/4 BI-LAMINATE (DRAPES) ×1 IMPLANT
DRAPE SURG 17X11 SM STRL (DRAPES) ×1 IMPLANT
DRAPE SURG ORHT 6 SPLT 77X108 (DRAPES) ×2 IMPLANT
DRAPE TOP 10253 STERILE (DRAPES) ×1 IMPLANT
DRAPE U-SHAPE 47X51 STRL (DRAPES) ×1 IMPLANT
DRESSING AQUACEL AG SP 3.5X6 (GAUZE/BANDAGES/DRESSINGS) ×1 IMPLANT
DRSG AQUACEL AG ADV 3.5X 6 (GAUZE/BANDAGES/DRESSINGS) IMPLANT
DRSG AQUACEL AG ADV 3.5X10 (GAUZE/BANDAGES/DRESSINGS) IMPLANT
DRSG AQUACEL AG SP 3.5X6 (GAUZE/BANDAGES/DRESSINGS) ×1
DURAPREP 26ML APPLICATOR (WOUND CARE) ×1 IMPLANT
ELECT BLADE TIP CTD 4 INCH (ELECTRODE) ×1 IMPLANT
ELECT PENCIL ROCKER SW 15FT (MISCELLANEOUS) ×1 IMPLANT
ELECT REM PT RETURN 15FT ADLT (MISCELLANEOUS) ×1 IMPLANT
FACESHIELD WRAPAROUND (MASK) ×5
FACESHIELD WRAPAROUND OR TEAM (MASK) ×5 IMPLANT
GLENOID UNI REV MOD 24 +2 LAT (Joint) IMPLANT
GLENOSPHERE 33+4 LAT/24 UNI RV (Joint) IMPLANT
GLOVE BIO SURGEON STRL SZ7.5 (GLOVE) ×1 IMPLANT
GLOVE BIO SURGEON STRL SZ8 (GLOVE) ×1 IMPLANT
GLOVE SS BIOGEL STRL SZ 7 (GLOVE) ×1 IMPLANT
GLOVE SS BIOGEL STRL SZ 7.5 (GLOVE) ×1 IMPLANT
GOWN STRL SURGICAL XL XLNG (GOWN DISPOSABLE) ×2 IMPLANT
KIT BASIN OR (CUSTOM PROCEDURE TRAY) ×1 IMPLANT
KIT TURNOVER KIT A (KITS) IMPLANT
LINER HUM CONST SHLD 33 +3 (Liner) IMPLANT
MANIFOLD NEPTUNE II (INSTRUMENTS) ×1 IMPLANT
NDL TAPERED W/ NITINOL LOOP (MISCELLANEOUS) ×1 IMPLANT
NEEDLE TAPERED W/ NITINOL LOOP (MISCELLANEOUS) ×1
NS IRRIG 1000ML POUR BTL (IV SOLUTION) ×1 IMPLANT
PACK SHOULDER (CUSTOM PROCEDURE TRAY) ×1 IMPLANT
PAD ARMBOARD 7.5X6 YLW CONV (MISCELLANEOUS) ×1 IMPLANT
PAD COLD SHLDR WRAP-ON (PAD) ×1 IMPLANT
PIN NITINOL TARGETER 2.8 (PIN) IMPLANT
PIN SET MODULAR GLENOID SYSTEM (PIN) IMPLANT
RESTRAINT HEAD UNIVERSAL NS (MISCELLANEOUS) ×1 IMPLANT
SCREW CENTRAL MODULAR 25 (Screw) IMPLANT
SCREW PERI LOCK 5.5X16 (Screw) IMPLANT
SCREW PERI LOCK 5.5X32 (Screw) IMPLANT
SCREW PERIPHERAL 5.5X20 LOCK (Screw) IMPLANT
SLING ARM FOAM STRAP LRG (SOFTGOODS) IMPLANT
SLING ARM FOAM STRAP MED (SOFTGOODS) IMPLANT
SLING ARM FOAM STRAP SML (SOFTGOODS) IMPLANT
STEM HUMERAL UNI REVERS SZ6 (Stem) IMPLANT
STRIP CLOSURE SKIN 1/2X4 (GAUZE/BANDAGES/DRESSINGS) ×1 IMPLANT
SUT MNCRL AB 3-0 PS2 18 (SUTURE) ×1 IMPLANT
SUT MON AB 2-0 CT1 36 (SUTURE) ×1 IMPLANT
SUT VIC AB 1 CT1 36 (SUTURE) ×1 IMPLANT
SUTURE TAPE 1.3 40 TPR END (SUTURE) ×2 IMPLANT
SUTURETAPE 1.3 40 TPR END (SUTURE) ×2
TOWEL GREEN STERILE FF (TOWEL DISPOSABLE) ×1 IMPLANT
TOWEL OR 17X26 10 PK STRL BLUE (TOWEL DISPOSABLE) ×1 IMPLANT
TUBE SUCTION HIGH CAP CLEAR NV (SUCTIONS) ×1 IMPLANT
TUBING CONNECTING 10 (TUBING) ×1 IMPLANT
WATER STERILE IRR 1000ML POUR (IV SOLUTION) ×2 IMPLANT

## 2023-01-24 NOTE — Evaluation (Signed)
Occupational Therapy Evaluation Patient Details Name: Allison Jimenez MRN: [id number] DOB: 06-Oct-1950 Today's Date: 01/24/2023   History of Present Illness 73 yo s/p R Reverse TSA.Marland Kitchen PMH: COPD, anxiety, back pain, arthritis.   Clinical Impression   PTA pt lives independently alone, however her sister will assist after DC. At baseline, pt uses a rollator. Discussed with PA who states it is OK for Araina to use her rollator for balance. Educated [place] on importance of using her rollator for balance, and to not lock/unlock her brakes using her R hand until cleared to do so by her MD/PA. Pt and caregiver/sister seen for education prior to surgery due to scheduling needs. Education completed regarding compensatory strategies for ADL tasks and functional mobility, management of sling, RUE ROM per specified parameters in the order set as indicated below, positioning of operative arm in sitting and supine and edema control, including use of "Iceman" Cold Therapy machine. Caregiver present for education, written handouts provided and reviewed using Teach Back and pt/caregiver verbalized/demonstrated understanding. Pt currently modified independent with ADL and mobility @ rollator later, however anticipate pt will need assistance with ADL postop. Pt to follow up with MD to progress rehab of the operative shoulder.        If plan is discharge home, recommend the following:      Functional Status Assessment  Patient has had a recent decline in their functional status and demonstrates the ability to make significant improvements in function in a reasonable and predictable amount of time.  Equipment Recommendations  None recommended by OT    Recommendations for Other Services       Precautions / Restrictions Precautions Precautions: Shoulder;Fall; Shoulder FF 0-60; ER 0-20; Abd 0-45. ROM for ADL purposes only; AROM elbow/wrist/hand; OK for lap slides and pendulums; Loosen sling from neck when arm is supported  in sitting  Shoulder Interventions: Shoulder sling/immobilizer;At all times;Off for dressing/bathing/exercises Precaution Booklet Issued: Yes (comment) Required Braces or Orthoses: Sling Restrictions Weight Bearing Restrictions Per Provider Order: Yes RUE Weight Bearing Per Provider Order: Non weight bearing      Mobility Bed Mobility               General bed mobility comments: Educated on compensatory strategy for bed mobility    Transfers Overall transfer level: Modified independent                        Balance      Hx of "balance issues" Uses a rollator                                     ADL either performed or assessed with clinical judgement   ADL Overall ADL's : Needs assistance/impaired                                Per orders, R shoulder parameters as follows for ADL tasks: Abd 0-45; ER 0-20; FF 0-60. While moving within specified parameters, pt/caregiver instructed on bathing and how to donn/doff shirt, placing operative arm through sleeve first when donning and off last when doffing.Pt/caregiver educated on compensatory strategies for LB ADL and strategies to reduce risk of falls.  Pt/caregiver educated on donning/doffing sling and to wear the sling at all times with the exception of ADL, and to loosen the neck strap of the sling  when the operative arm is in a supported position when sitting. In sitting or supine, pt instructed to have a pillow behind and under their operative arm to provide support. If assist needed with ambulation, caregiver educated on the importance of walking on pt's non-operative side.  Education regarding use of "IceMan" Cold Therapy completed, including the importance of using a barrier on the shoulder prior to positioning the wrap-on pad. Pt/caregiver verbalized/demonstrated understanding. Teach Back used throughout eduction.       Functional mobility during ADLs: Modified independent;Rollator (4  wheels) General ADL Comments: see comments below in shoulder section. Seen as "prehab" for education.     Vision Baseline Vision/History: 1 Wears glasses Vision Assessment?: Wears glasses for reading     Perception         Praxis         Pertinent Vitals/Pain Pain Assessment Pain Assessment: Faces Pain Score: 6  Pain Location: R shoulder Pain Descriptors / Indicators: Discomfort, Grimacing Pain Intervention(s): Limited activity within patient's tolerance     Extremity/Trunk Assessment Upper Extremity Assessment Upper Extremity Assessment: Right hand dominant;RUE deficits/detail RUE Deficits / Details: elbow/wrist/hand AROM WFL; hx of CTS per pt; shoulder AROM very limited to @ 30 degree FF adn Abd; ER to 60 RUE Coordination: decreased gross motor       Cervical / Trunk Assessment Cervical / Trunk Assessment: Other exceptions (hx of back pain)   Communication     Cognition Arousal: Alert Behavior During Therapy: WFL for tasks assessed/performed Overall Cognitive Status: Within Functional Limits for tasks assessed                                       General Comments       Exercises Exercises: Shoulder Shoulder Exercises Pendulum Exercise: PROM, Right, Other (comment) (dangle) Elbow Flexion: AROM, Right, 5 reps Elbow Extension: AROM, Right, 5 reps Wrist Flexion: AROM, Right, 5 reps Wrist Extension: AROM, Right, 5 reps Digit Composite Flexion: AROM, Right, 5 reps Composite Extension: AROM, Right, 5 reps Neck Flexion: AROM Neck Extension: AROM Neck Lateral Flexion - Right: AROM Neck Lateral Flexion - Left: AROM   Shoulder Instructions Shoulder Instructions Donning/doffing shirt without moving shoulder: Caregiver independent with task;Patient able to independently direct caregiver Method for sponge bathing under operated UE: Caregiver independent with task;Patient able to independently direct caregiver Donning/doffing sling/immobilizer:  Caregiver independent with task;Patient able to independently direct caregiver Correct positioning of sling/immobilizer: Caregiver independent with task;Patient able to independently direct caregiver Pendulum exercises (written home exercise program): Caregiver independent with task;Patient able to independently direct caregiver (dangle) ROM for elbow, wrist and digits of operated UE: Independent;Caregiver independent with task Sling wearing schedule (on at all times/off for ADL's): Independent;Caregiver independent with task Proper positioning of operated UE when showering: Independent;Caregiver independent with task Positioning of UE while sleeping: Independent;Caregiver independent with task    Home Living Family/patient expects to be discharged to:: Private residence Living Arrangements: Other relatives Available Help at Discharge: Family Type of Home: House Home Access: Stairs to enter Secretary/administrator of Steps: 2 Entrance Stairs-Rails: Left Home Layout: One level     Bathroom Shower/Tub: Chief Strategy Officer: Standard Bathroom Accessibility: Yes How Accessible: Accessible via walker Home Equipment: Rollator (4 wheels);Grab bars - tub/shower;BSC/3in1          Prior Functioning/Environment Prior Level of Function : Independent/Modified Independent  OT Problem List: Decreased strength;Decreased range of motion;Decreased knowledge of precautions;Impaired UE functional use      OT Treatment/Interventions:      OT Goals(Current goals can be found in the care plan section) Acute Rehab OT Goals Patient Stated Goal: to get better use of her arm OT Goal Formulation: With patient/family  OT Frequency:      Co-evaluation              AM-PAC OT "6 Clicks" Daily Activity     Outcome Measure Help from another person eating meals?: A Little Help from another person taking care of personal grooming?: A Little Help from  another person toileting, which includes using toliet, bedpan, or urinal?: A Little Help from another person bathing (including washing, rinsing, drying)?: A Little Help from another person to put on and taking off regular upper body clothing?: A Little Help from another person to put on and taking off regular lower body clothing?: A Little 6 Click Score: 18   End of Session Equipment Utilized During Treatment: Rollator (4 wheels) Nurse Communication: Mobility status  Activity Tolerance: Patient tolerated treatment well Patient left: Other (comment) (stretcher)  OT Visit Diagnosis: Muscle weakness (generalized) (M62.81)                Time: 1610-9604 OT Time Calculation (min): 37 min Charges:  OT General Charges $OT Visit: 1 Visit OT Evaluation $OT Eval Low Complexity: 1 Low OT Treatments $Self Care/Home Management : 8-22 mins  Luisa Dago, OT/L   Acute OT Clinical Specialist Acute Rehabilitation Services Pager [phone] Office [phone]   [facility] 01/24/2023, 4:21 PM

## 2023-01-24 NOTE — Anesthesia Procedure Notes (Signed)
Anesthesia Regional Block: Interscalene brachial plexus block   Pre-Anesthetic Checklist: , timeout performed,  Correct Patient, Correct Site, Correct Laterality,  Correct Procedure, Correct Position, site marked,  Risks and benefits discussed,  Surgical consent,  Pre-op evaluation,  At surgeon's request and post-op pain management  Laterality: Upper and Right  Prep: Maximum Sterile Barrier Precautions used, chloraprep       Needles:  Injection technique: Single-shot  Needle Type: Echogenic Needle     Needle Length: 5cm  Needle Gauge: 21     Additional Needles:   Procedures:,,,, ultrasound used (permanent image in chart),,    Narrative:  Start time: 01/24/2023 1:53 PM End time: 01/24/2023 1:59 PM Injection made incrementally with aspirations every 5 mL.  Performed by: Personally  Anesthesiologist: Trevor Iha, MD  Additional Notes: Block assessed prior to procedure. Patient tolerated procedure well.

## 2023-01-24 NOTE — Transfer of Care (Signed)
Immediate Anesthesia Transfer of Care Note  Patient: Allison Jimenez  Procedure(s) Performed: REVERSE SHOULDER ARTHROPLASTY (Right: Shoulder)  Patient Location: PACU  Anesthesia Type:General  Level of Consciousness: awake, alert , and oriented  Airway & Oxygen Therapy: Patient Spontanous Breathing  Post-op Assessment: Report given to RN  Post vital signs: Reviewed and stable  Last Vitals:  Vitals Value Taken Time  BP 107/78 01/24/23 1608  Temp    Pulse 90 01/24/23 1610  Resp 22 01/24/23 1610  SpO2 95 % 01/24/23 1610  Vitals shown include unfiled device data.  Last Pain:  Vitals:   01/24/23 1420  PainSc: 0-No pain         Complications: No notable events documented.

## 2023-01-24 NOTE — Anesthesia Postprocedure Evaluation (Signed)
Anesthesia Post Note  Patient: Allison Jimenez  Procedure(s) Performed: REVERSE SHOULDER ARTHROPLASTY (Right: Shoulder)     Patient location during evaluation: PACU Anesthesia Type: Regional and General Level of consciousness: awake and alert Pain management: pain level controlled Vital Signs Assessment: post-procedure vital signs reviewed and stable Respiratory status: spontaneous breathing, nonlabored ventilation, respiratory function stable and patient connected to nasal cannula oxygen Cardiovascular status: blood pressure returned to baseline and stable Postop Assessment: no apparent nausea or vomiting Anesthetic complications: no   No notable events documented.  Last Vitals:  Vitals:   01/24/23 1654 01/24/23 1704  BP: 126/78 136/73  Pulse: 65 67  Resp:    Temp: 36.8 C   SpO2: 95% 93%    Last Pain:  Vitals:   01/24/23 1654  PainSc: 0-No pain                 Trevor Iha

## 2023-01-24 NOTE — Care Plan (Signed)
Ortho Bundle Case Management Note  Patient Details  Name: Allison Jimenez MRN: [id number] Date of Birth: 1950/10/02                  R Rev TSA on 01/24/23.  DCP: Home with sister, who lives with her.  DME: No needs.  PT: Wants to discuss therapy options at first post op visit.   DME Arranged:  N/A DME Agency:       Additional Comments: Please contact me with any questions of if this plan should need to change.    Despina Pole, CCM Case Manager, Raechel Chute  [phone] 01/24/2023, 2:37 PM

## 2023-01-24 NOTE — H&P (Signed)
Allison Jimenez    Chief Complaint: Right Rotator cuff tear arthropathy HPI: The patient is a 73 y.o. female with chronic and progressively increasing right shoulder pain related to severe rotator cuff tear arthropathy.  Additionally, she has significant underlying chronic medical comorbidities including COPD.  She has been a tobacco smoker but has been counseled regarding smoking cessation in preparation for surgery.  Additionally she has obtained appropriate pre-op medical clearance for surgery.  Past Medical History:  Diagnosis Date   Acute blood loss anemia (ABLA) 09/05/2020   Allergy    ANXIETY 06/05/2006   Arthritis    SHOULDERS    BACK PAIN 01/05/2010   Carotid artery occlusion    Cataract    bilateral, left worse   Centrilobular emphysema (HCC)    COPD (chronic obstructive pulmonary disease) (HCC)    Depression    DIVERTICULOSIS, COLON 06/05/2006   DIZZINESS OR VERTIGO 06/05/2006   positional   GERD (gastroesophageal reflux disease)    Headache    Migraines   Hearing loss in left ear    no hearing aid   History of blood transfusion 08/2020   History of kidney stones    passed stones   HYPERLIPIDEMIA 06/05/2006   Hypertension    HYPOKALEMIA 12/14/2008   Hypothyroidism    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   Qualifier: Diagnosis of  By: Amador Cunas  MD, Janett Labella    Pneumonia    Pre-diabetes    TOBACCO USER 11/25/2007      Past Surgical History:  Procedure Laterality Date   APPENDECTOMY     bilateral cataract surgery      BRONCHIAL BIOPSY  09/28/2021   Procedure: BRONCHIAL BIOPSIES;  Surgeon: Omar Person, MD;  Location: [facility] ENDOSCOPY;  Service: Pulmonary;;   BRONCHIAL NEEDLE ASPIRATION BIOPSY  09/28/2021   Procedure: BRONCHIAL NEEDLE ASPIRATION BIOPSIES;  Surgeon: Omar Person, MD;  Location: [facility] ENDOSCOPY;  Service: Pulmonary;;   BRONCHIAL WASHINGS  09/28/2021   Procedure: BRONCHIAL WASHINGS;  Surgeon: Omar Person, MD;  Location[facility] ENDOSCOPY;   Service: Pulmonary;;   CESAREAN SECTION     x1   CHOLECYSTECTOMY     COLONOSCOPY     COSMETIC SURGERY     CYSTOSCOPY W/ URETERAL STENT PLACEMENT Right 07/29/2022   Procedure: CYSTOSCOPY WITH RETROGRADE PYELOGRAM/URETERAL STENT PLACEMENT;  Surgeon: Malen Gauze, MD;  Location: [facility] ORS;  Service: Urology;  Laterality: Right;   CYSTOSCOPY/URETEROSCOPY/HOLMIUM LASER/STENT PLACEMENT Right 09/05/2022   Procedure: RIGHT URETEROSCOPY/HOLMIUM LASER/STENT PLACEMENT;  Surgeon: Despina Arias, MD;  Location: [facility] ORS;  Service: Urology;  Laterality: Right;   mastoid surgery  1992   shunt in mastoid- endolymphatic sac in left ear    MRI  07/11/2020   x several, last one located in CE   TUBAL LIGATION     UPPER GI ENDOSCOPY     vocal cord surgery     nodule x2    Family History  Problem Relation Age of Onset   Breast cancer Mother    Dementia Mother    Heart disease Mother    Hypertension Mother    Thyroid disease Mother    Alzheimer's disease Father    Heart disease Father    Hypertension Father    Thyroid disease Father    Diabetes Sister    Colon cancer Neg Hx    Rectal cancer Neg Hx    Stomach cancer Neg Hx    Colon polyps Neg Hx    Esophageal cancer Neg Hx  Social History:  reports that she has been smoking cigarettes. She has a 24.5 pack-year smoking history. She has never used smokeless tobacco. She reports that she does not drink alcohol and does not use drugs.  BMI: There is no height or weight on file to calculate BMI.  Lab Results  Component Value Date   ALBUMIN 4.5 01/16/2023   Diabetes:   Patient has a diagnosis of diabetes,  Lab Results  Component Value Date   HGBA1C 5.6 08/08/2022   Smoking Status: Social History   Tobacco Use  Smoking Status Every Day   Current packs/day: 0.50   Average packs/day: 0.5 packs/day for 49.0 years (24.5 ttl pk-yrs)   Types: Cigarettes  Smokeless Tobacco Never  Tobacco Comments   3/4 ppd 08/30/21 Vernie Murders, CMA    Ready to quit: Not Answered Counseling given: Not Answered Tobacco comments: 3/4 ppd 08/30/21 Vernie Murders, CMA  The patient has participated in a 4-week cessation program.           No medications prior to admission.     Physical Exam: Right shoulder demonstrates painful and severely restricted motion of the right shoulder as noted at her recent office visit.  Her strength is globally decreased secondary to pain and guarding.  Otherwise grossly neurovascular intact in the right upper extremity.  Radiographs  Plain films of the right shoulder confirm changes consistent with chronic severe rotator cuff tear arthropathy.  Vitals     Assessment/Plan  Impression: Right Rotator cuff tear arthropathy  Plan of Action: Procedure(s): REVERSE SHOULDER ARTHROPLASTY  Allison Jimenez 01/24/2023, 6:04 AM Contact # [phone]

## 2023-01-24 NOTE — Anesthesia Procedure Notes (Signed)
Procedure Name: Intubation Date/Time: 01/24/2023 2:35 PM  Performed by: Randa Evens, CRNAPre-anesthesia Checklist: Patient identified, Emergency Drugs available, Suction available and Patient being monitored Patient Re-evaluated:Patient Re-evaluated prior to induction Oxygen Delivery Method: Circle System Utilized Preoxygenation: Pre-oxygenation with 100% oxygen Induction Type: IV induction Ventilation: Mask ventilation without difficulty Tube type: Oral Number of attempts: 1 Airway Equipment and Method: Stylet and Oral airway Placement Confirmation: ETT inserted through vocal cords under direct vision, positive ETCO2 and breath sounds checked- equal and bilateral Secured at: 20 cm Tube secured with: Tape Dental Injury: Teeth and Oropharynx as per pre-operative assessment

## 2023-01-24 NOTE — Telephone Encounter (Signed)
Fax received from Dr. Francena Hanly with [facility] to perform a right reverse shoulder arthroplasty under general anesthesia on patient.  Patient needs surgery clearance. Surgery is pending. Patient was seen on 01/21/23. Office protocol is a risk assessment can be sent to surgeon if patient has been seen in 60 days or less.   Copy of the 01/21/23 ov note containing risk assessment faxed to [facility]

## 2023-01-24 NOTE — Op Note (Signed)
01/24/2023  3:45 PM  PATIENT:   Allison Jimenez  73 y.o. female  PRE-OPERATIVE DIAGNOSIS:  Right Rotator cuff tear arthropathy  POST-OPERATIVE DIAGNOSIS: Same  PROCEDURE: Right shoulder reverse arthroplasty utilizing a press-fit size 6 Arthrex stem with a neutral metathesis, +3 constrained polyethylene insert, 33/+4 glenosphere and a small/+2 baseplate  SURGEON:  Wane Mollett, Vania Rea M.D.  ASSISTANTS: Ralene Bathe, PA-C  Ralene Bathe, PA-C was utilized as an Geophysicist/field seismologist throughout this case, essential for help with positioning the patient, positioning extremity, tissue manipulation, implantation of the prosthesis, suture management, wound closure, and intraoperative decision-making.  ANESTHESIA:   General Endotracheal and interscalene block with Exparel  EBL: 100 cc  SPECIMEN: None  Drains: None   PATIENT DISPOSITION:  PACU - hemodynamically stable.    PLAN OF CARE: Discharge to home after PACU  Brief history:  Patient is a 73 year old female with chronic and progressively increasing right shoulder pain related to severe rotator cuff tear arthropathy.  Due to her increasing pain and functional limitations and failure to respond to prolonged attempts at conservative management, she is brought to the operating this time for planned right shoulder reverse arthroplasty.  Preoperatively, I counseled the patient regarding treatment options and risks versus benefits thereof.  Possible surgical complications were all reviewed including potential for bleeding, infection, neurovascular injury, persistent pain, loss of motion, anesthetic complication, failure of the implant, and possible need for additional surgery. They understand and accept and agrees with our planned procedure.   Procedure detail:  After undergoing routine preop evaluation the patient received prophylactic antibiotics and interscalene block with Exparel was established in the holding area by the anesthesia department.   Subsequently placed spine on the operating table and underwent the smooth induction of a general endotracheal anesthesia.  Placed into the beachchair position and appropriately padded and protected.  The right shoulder girdle region was sterilely prepped and draped in standard fashion.  Timeout was called.  A deltopectoral approach to the right shoulder is made an approximately 8 cm incision.  Skin flaps elevated dissection carried deeply deltopectoral interval developed from proximal to distal with the vein taken laterally.  Conjoined tendon mobilized and retracted medially.  Humeral head was noted to have severe anterosuperior escape.  The remnant of the subscapularis was separated from the tuberosity with electrocautery and the margin tagged with a pair of grasping suture tape sutures although the subscap did not appear to have any significant viability.  They have been previous rupture long head biceps tendon.  Humeral head delivered through the wound an extra medullary guide was then used to outline the proposed humeral head resection which we performed with an oscillating saw at approximately 20 degrees of retroversion.  A metal cap was then placed at the cut proximal humeral surface and exposed glenoid and performed a circumferential labral resection.  Guidepin was then directed into the center of the glenoid and the glenoid was reamed with the central followed by the peripheral reamer to a stable subchondral bony bed.  Preparation completed with a drill and tapped for a 25 mm lag screw.  A baseplate was assembled and inserted with vancomycin powder applied to the threads of the lag screw and excellent fixation was achieved.  All of the peripheral locking screws were then placed using standard technique with excellent fixation.  A 33/+4 glenosphere was then impacted onto the baseplate and a central locking screw was placed.  We returned attention back to the humeral metaphysis and we did identify an area  where the calcar had been compromised by the [facility] retractor and so we prophylactically place a fiber cerclage around the humeral metaphysis to prevent propagation of unicortical fracture lines.  We then broached the humerus up to a size 6 and used the metaphyseal reaming guide to prepare the metaphysis.  A trial implant was placed which upon reduction showed good motion stability and soft tissue balance.  The trial was then removed.  A final implant was assembled.  The canal was irrigated cleaned and dried with vancomycin powder applied in the canal.  The implant was then partially seated and then I took bone graft from the resected humeral head to fill a slight metaphyseal void posteriorly and then went ahead and terminally seated the implant with excellent fixation.  Trial reduction showed good motion stability and soft tissue balance with a +3 poly.  Given the absence of the subscap function and we utilized a +3 constrained poly which was impacted on the implant and a final reduction showed good motion stability and soft tissue balance.  The wound was copes irrigated.  This point the deltopectoral interval was then reapproximated with a series of figure-of-eight number Vicryl sutures.  2-0 Monocryl used to close the subcu layer and intracuticular 3-0 Monocryl used to close the skin followed by Steri-Strips and an Aquacel dressing.  The right arm placed into a sling.  The patient was awakened, extubated, and taken to recovery in stable condition.   Contact # [phone]

## 2023-01-24 NOTE — Discharge Instructions (Signed)
? ?Allison Jimenez, M.D., F.A.A.O.S. ?Orthopaedic Surgery ?Specializing in Arthroscopic and Reconstructive ?Surgery of the Shoulder ?[phone] ?[address] - [place],  [address] - Fax [phone] ? ? ?POST-OP TOTAL SHOULDER REPLACEMENT INSTRUCTIONS ? ?1. Follow up in the office for your first post-op appointment 10-14 days from the date of your surgery. If you do not already have a scheduled appointment, our office will contact you to schedule. ? ?2. The bandage over your incision is waterproof. You may begin showering with this dressing on. You may leave this dressing on until first follow up appointment within 2 weeks. We prefer you leave this dressing in place until follow up however after 5-7 days if you are having itching or skin irritation and would like to remove it you may do so. Go slow and tug at the borders gently to break the bond the dressing has with the skin. At this point if there is no drainage it is okay to go without a bandage or you may cover it with a light guaze and tape. You can also expect significant bruising around your shoulder that will drift down your arm and into your chest wall. This is very normal and should resolve over several days. ? ? 3. Wear your sling/immobilizer at all times except to perform the exercises below or to occasionally let your arm dangle by your side to stretch your elbow. You also need to sleep in your sling immobilizer until instructed otherwise. It is ok to remove your sling if you are sitting in a controlled environment and allow your arm to rest in a position of comfort by your side or on your lap with pillows to give your neck and skin a break from the sling. You may remove it to allow arm to dangle by side to shower. If you are up walking around and when you go to sleep at night you need to wear it. ? ?4. Range of motion to your elbow, wrist, and hand are encouraged 3-5 times daily. Exercise to your hand and fingers helps to reduce  swelling you may experience. ? ? ?5. Prescriptions for a pain medication and a muscle relaxant are provided for you. It is recommended that if you are experiencing pain that you pain medication alone is not controlling, add the muscle relaxant along with the pain medication which can give additional pain relief. The first 1-2 days is generally the most severe of your pain and then should gradually decrease. As your pain lessens it is recommended that you decrease your use of the pain medications to an "as needed basis'" only and to always comply with the recommended dosages of the pain medications. ? ?6. Pain medications can produce constipation along with their use. If you experience this, the use of an over the counter stool softener or laxative daily is recommended.  ? ?7. For additional questions or concerns, please do not hesitate to call the office. If after hours there is an answering service to forward your concerns to the physician on call. ? ?8.Pain control following an exparel block ? ?To help control your post-operative pain you received a nerve block  performed with Exparel which is a long acting anesthetic (numbing agent) which can provide pain relief and sensations of numbness (and relief of pain) in the operative shoulder and arm for up to 3 days. Sometimes it provides mixed relief, meaning you may still have numbness in certain areas of the arm but can still be able to   move  parts of that arm, hand, and fingers. We recommend that your prescribed pain medications  be used as needed. We do not feel it is necessary to "pre medicate" and "stay ahead" of pain.  Taking narcotic pain medications when you are not having any pain can lead to unnecessary and potentially dangerous side effects.   ? ?9. Use the ice machine as much as possible in the first 5-7 days from surgery, then you can wean its use to as needed. The ice typically needs to be replaced every 6 hours, instead of ice you can actually freeze  water bottles to put in the cooler and then fill water around them to avoid having to purchase ice. You can have spare water bottles freezing to allow you to rotate them once they have melted. Try to have a thin shirt or light cloth or towel under the ice wrap to protect your skin.  ? ?FOR ADDITIONAL INFO ON ICE MACHINE AND INSTRUCTIONS GO TO THE WEBSITE AT ? ?https://www.djoglobal.com/products/donjoy/donjoy-iceman-classic3 ? ?10.  We recommend that you avoid any dental work or cleaning in the first 3 months following your joint replacement. This is to help minimize the possibility of infection from the bacteria in your mouth that enters your bloodstream during dental work. We also recommend that you take an antibiotic prior to your dental work for the first year after your shoulder replacement to further help reduce that risk. Please simply contact our office for antibiotics to be sent to your pharmacy prior to dental work. ? ?11. Dental Antibiotics: ? ?We recommend waiting at least 3 months for any dental work even cleanings unless there is a dental emergency. We also recommend  prophylactic antibiotics for all dental procdeures  the first year following your joint replacement. In some exceptions we recommend them to be used lifelong. We will provide you with that prescription in follow up office visits, or you can call our office. ? ?Exceptions are as follows: ? ?1. History of prior total joint infection ? ?2. Severely immunocompromised (Organ Transplant, cancer chemotherapy, Rheumatoid biologic ?meds such as Humera) ? ?3. Poorly controlled diabetes (A1C &gt; 8.0, blood glucose over 200) ? ? ?POST-OP EXERCISES ? ?Pendulum Exercises ? ?Perform pendulum exercises while standing and bending at the waist. Support your uninvolved arm on a table or chair and allow your operated arm to hang freely. Make sure to do these exercises passively - not using you shoulder muscles. These exercises can be performed once your  nerve block effects have worn off. ? ?Repeat 20 times. Do 3 sessions per day. ? ? ?  ?

## 2023-01-25 ENCOUNTER — Encounter (HOSPITAL_COMMUNITY): Payer: Self-pay | Admitting: Orthopedic Surgery

## 2023-01-28 ENCOUNTER — Ambulatory Visit: Payer: Self-pay | Admitting: Family Medicine

## 2023-01-28 NOTE — Telephone Encounter (Signed)
Copied from CRM [id number]. Topic: Clinical - Prescription Issue >> Jan 28, 2023  4:16 PM Dennison Nancy wrote: Reason for CRM: request a callback from someone directly in the office to call patient about [facility] reason they keep texting patient that she need a co pay $1200 for her bone  medication  patient can not afford the medication , patient would need fiancial assistance

## 2023-01-29 NOTE — Telephone Encounter (Signed)
Per [facility] patient has a $450 deductible plus $2000 out of pocket max that she has to meet before her 27% co-insurance kicks in.  Per patient she cannot afford the $1200 for Prolia.

## 2023-01-31 ENCOUNTER — Other Ambulatory Visit: Payer: Medicare PPO

## 2023-02-01 ENCOUNTER — Ambulatory Visit (HOSPITAL_BASED_OUTPATIENT_CLINIC_OR_DEPARTMENT_OTHER)
Admission: RE | Admit: 2023-02-01 | Discharge: 2023-02-01 | Disposition: A | Discharge status: Home / Self Care | Payer: Medicare PPO | Source: Ambulatory Visit | Attending: Nurse Practitioner | Admitting: Radiology

## 2023-02-01 DIAGNOSIS — J439 Emphysema, unspecified: Secondary | ICD-10-CM | POA: Diagnosis not present

## 2023-02-01 DIAGNOSIS — I7143 Infrarenal abdominal aortic aneurysm, without rupture: Secondary | ICD-10-CM | POA: Diagnosis not present

## 2023-02-01 DIAGNOSIS — R918 Other nonspecific abnormal finding of lung field: Secondary | ICD-10-CM

## 2023-02-01 DIAGNOSIS — I700 Atherosclerosis of aorta: Secondary | ICD-10-CM | POA: Diagnosis not present

## 2023-02-03 DIAGNOSIS — J209 Acute bronchitis, unspecified: Secondary | ICD-10-CM | POA: Diagnosis not present

## 2023-02-03 DIAGNOSIS — R070 Pain in throat: Secondary | ICD-10-CM | POA: Diagnosis not present

## 2023-02-06 DIAGNOSIS — Z96611 Presence of right artificial shoulder joint: Secondary | ICD-10-CM | POA: Diagnosis not present

## 2023-02-08 DIAGNOSIS — H04123 Dry eye syndrome of bilateral lacrimal glands: Secondary | ICD-10-CM | POA: Diagnosis not present

## 2023-02-08 DIAGNOSIS — E119 Type 2 diabetes mellitus without complications: Secondary | ICD-10-CM | POA: Diagnosis not present

## 2023-02-08 LAB — HM DIABETES EYE EXAM

## 2023-02-13 ENCOUNTER — Encounter: Payer: Self-pay | Admitting: Emergency Medicine

## 2023-02-13 ENCOUNTER — Ambulatory Visit: Payer: Medicare PPO | Admitting: Emergency Medicine

## 2023-02-13 VITALS — BP 120/52 | HR 78 | Ht <= 58 in | Wt 95.2 lb

## 2023-02-13 DIAGNOSIS — J449 Chronic obstructive pulmonary disease, unspecified: Secondary | ICD-10-CM

## 2023-02-13 DIAGNOSIS — R918 Other nonspecific abnormal finding of lung field: Secondary | ICD-10-CM

## 2023-02-13 DIAGNOSIS — F17200 Nicotine dependence, unspecified, uncomplicated: Secondary | ICD-10-CM | POA: Diagnosis not present

## 2023-02-13 NOTE — Assessment & Plan Note (Signed)
Tobacco Use Currently smoking half a pack per day. -Reduce to 7 cigarettes per day by next visit.

## 2023-02-13 NOTE — Progress Notes (Signed)
Subjective:    Patient ID: Allison Jimenez, female    DOB: March 31, 1950, 73 y.o.   MRN: [id number]  HPI Allison Jimenez is a 73 year old female with emphysema, COPD, and chronic cavitary aspergillosis who presents for follow-up of pulmonary nodular disease. She is accompanied by her sister. She was referred by Dr. Thora Lance for follow-up of pulmonary nodular disease.  She has a history of pulmonary nodular disease, with a CT scan on June 23, 2022, showing multiple pulmonary nodules, most of which were stable except for a right upper lobe nodule that increased from 4 mm to 7 mm. A follow-up CT scan on February 01, 2023, showed multiple bilateral pulmonary nodules, many stable or slightly decreased in size, with no new nodules. The right upper lobe nodule was 8 mm, unchanged from the previous scan.  She has chronic cavitary aspergillosis, confirmed by bronchoscopy in September 2023, and was treated with itraconazole from February 2024 to May 2024. She experienced side effects, including bruising on her legs, and did not notice significant improvement in her symptoms.  She has a history of COPD and emphysema, currently managed with Trelegy and occasional use of albuterol. She recently visited urgent care for throat issues and sinus drainage and was prescribed doxycycline, which she is currently taking. She has not used albuterol recently but keeps it available.  She smokes about half a pack of cigarettes a day and lives with her sister, who is a Engineer, civil (consulting).  Her past medical history includes abdominal aortic aneurysm without rupture, hypertension, diabetes, hypothyroidism, Alzheimer's, anxiety/depression, and hyperlipidemia. She recently underwent shoulder replacement surgery three weeks ago and is managing postoperative pain with oxycodone, which she takes every seven to eight hours as needed.   RADIOLOGY Chest CT: Multiple pulmonary nodules, majority stable, 7mm right upper lobe nodule increased in size from  4mm on 05/15/2022 (06/23/2022) Chest CT: Multiple bilateral pulmonary nodules, many stable, some slightly decreased in size, 8mm nodule in right upper lobe (02/01/2023)  DIAGNOSTIC Bronchoscopy: Aspergillus identified (09/2021)  Review of Systems As per HPI  Past Medical History:  Diagnosis Date   Acute blood loss anemia (ABLA) 09/05/2020   Allergy    ANXIETY 06/05/2006   Arthritis    SHOULDERS    BACK PAIN 01/05/2010   Carotid artery occlusion    Cataract    bilateral, left worse   Centrilobular emphysema (HCC)    COPD (chronic obstructive pulmonary disease) (HCC)    Depression    DIVERTICULOSIS, COLON 06/05/2006   DIZZINESS OR VERTIGO 06/05/2006   positional   GERD (gastroesophageal reflux disease)    Headache    Migraines   Hearing loss in left ear    no hearing aid   History of blood transfusion 08/2020   History of kidney stones    passed stones   HYPERLIPIDEMIA 06/05/2006   Hypertension    HYPOKALEMIA 12/14/2008   Hypothyroidism    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   Qualifier: Diagnosis of  By: Amador Cunas  MD, Janett Labella    Pneumonia    Pre-diabetes    TOBACCO USER 11/25/2007     Family History  Problem Relation Age of Onset   Breast cancer Mother    Dementia Mother    Heart disease Mother    Hypertension Mother    Thyroid disease Mother    Alzheimer's disease Father    Heart disease Father    Hypertension Father    Thyroid disease Father    Diabetes Sister  Colon cancer Neg Hx    Rectal cancer Neg Hx    Stomach cancer Neg Hx    Colon polyps Neg Hx    Esophageal cancer Neg Hx      Social History   Socioeconomic History   Marital status: Divorced    Spouse name: Not on file   Number of children: 1   Years of education: 13   Highest education level: Not on file  Occupational History   Occupation: ASSISTANT CREDIT MANAGER    Employer: [employer]    Comment: Retired  Tobacco Use   Smoking status: Every Day    Current packs/day:  0.50    Average packs/day: 0.5 packs/day for 49.0 years (24.5 ttl pk-yrs)    Types: Cigarettes   Smokeless tobacco: Never   Tobacco comments:    3/4 ppd 08/30/21 Vernie Murders, CMA  Vaping Use   Vaping status: Former   Start date: 01/01/2010   Quit date: 08/01/2021   Substances: Nicotine   Devices: menthol/fruit flavor  Substance and Sexual Activity   Alcohol use: No    Alcohol/week: 0.0 standard drinks of alcohol   Drug use: No   Sexual activity: Not Currently    Birth control/protection: Post-menopausal  Other Topics Concern   Not on file  Social History Narrative   Patient lives at home alone. Patient has a Biochemist, clinical. Patient is divorced .   Patient is retired.   Education high school.   Right handed.   Caffeine none.   Social Drivers of Corporate investment banker Strain: Low Risk  (10/31/2022)   Overall Financial Resource Strain (CARDIA)    Difficulty of Paying Living Expenses: Not hard at all  Food Insecurity: No Food Insecurity (09/27/2022)   Hunger Vital Sign    Worried About Running Out of Food in the Last Year: Never true    Ran Out of Food in the Last Year: Never true  Transportation Needs: No Transportation Needs (09/27/2022)   PRAPARE - Administrator, Civil Service (Medical): No    Lack of Transportation (Non-Medical): No  Physical Activity: Inactive (10/31/2022)   Exercise Vital Sign    Days of Exercise per Week: 0 days    Minutes of Exercise per Session: 0 min  Stress: No Stress Concern Present (10/31/2022)   [employer] of Occupational Health - Occupational Stress Questionnaire    Feeling of Stress : Not at all  Social Connections: Socially Isolated (10/31/2022)   Social Connection and Isolation Panel [NHANES]    Frequency of Communication with Friends and Family: Three times a week    Frequency of Social Gatherings with Friends and Family: Three times a week    Attends Religious Services: Never    Active Member of Clubs or  Organizations: No    Attends Banker Meetings: Never    Marital Status: Divorced  Catering manager Violence: Not At Risk (07/29/2022)   Humiliation, Afraid, Rape, and Kick questionnaire    Fear of Current or Ex-Partner: No    Emotionally Abused: No    Physically Abused: No    Sexually Abused: No     Allergies  Allergen Reactions   Erythromycin Base Diarrhea   Vfend [Voriconazole] Other (See Comments)    Visual changes   Penicillins Rash     Outpatient Medications Prior to Visit  Medication Sig Dispense Refill   acetaminophen (TYLENOL) 500 MG tablet Take 500-1,000 mg by mouth every 6 (six) hours as needed  for mild pain, moderate pain or headache.     albuterol (VENTOLIN HFA) 108 (90 Base) MCG/ACT inhaler Inhale 2 puffs into the lungs every 6 (six) hours as needed for shortness of breath or wheezing. 8 g 2   ALPRAZolam (XANAX) 0.25 MG tablet Take 0.5-1 tablets (0.125-0.25 mg total) by mouth daily as needed for anxiety or sleep. 30 tablet 1   atorvastatin (LIPITOR) 20 MG tablet TAKE 1 TABLET(20 MG) BY MOUTH DAILY 90 tablet 1   calcitonin, salmon, (MIACALCIN/FORTICAL) 200 UNIT/ACT nasal spray Place 1 spray into alternate nostrils daily.     Carboxymethylcellul-Glycerin (LUBRICATING EYE DROPS OP) Place 1 drop into both eyes daily.     carvedilol (COREG) 25 MG tablet TAKE 1 TABLET(25 MG) BY MOUTH TWICE DAILY WITH A MEAL 180 tablet 0   cetirizine (ZYRTEC) 10 MG tablet Take 10 mg by mouth daily as needed for allergies.     chlorthalidone (HYGROTON) 25 MG tablet Take 1 tablet (25 mg total) by mouth daily. 90 tablet 1   Cholecalciferol (VITAMIN D) 50 MCG (2000 UT) CAPS Take 2,000 Units by mouth daily.     conjugated estrogens (PREMARIN) vaginal cream Place 1 applicator vaginally daily as needed (dryness / burning).     cyclobenzaprine (FLEXERIL) 10 MG tablet Take 1 tablet (10 mg total) by mouth 3 (three) times daily as needed for muscle spasms. 30 tablet 1   diclofenac Sodium  (VOLTAREN) 1 % GEL Apply 1 Application topically 4 (four) times daily as needed (pain).     doxycycline (VIBRA-TABS) 100 MG tablet Take 100 mg by mouth 2 (two) times daily.     Erenumab-aooe (AIMOVIG) 140 MG/ML SOAJ Inject 140 mg into the skin every 30 (thirty) days.     famotidine (PEPCID) 40 MG tablet TAKE 1 TABLET(40 MG) BY MOUTH AT BEDTIME 90 tablet 3   FLUoxetine (PROZAC) 20 MG capsule Take 2 capsules (40 mg total) by mouth daily. (Patient taking differently: Take 60 mg by mouth daily.) 180 capsule 0   fluticasone (FLONASE) 50 MCG/ACT nasal spray SHAKE LIQUID AND USE 2 SPRAYS IN EACH NOSTRIL DAILY (Patient taking differently: Place 2 sprays into both nostrils daily as needed for allergies.) 16 g 6   Fluticasone-Umeclidin-Vilant (TRELEGY ELLIPTA) 100-62.5-25 MCG/ACT AEPB Inhale 1 puff into the lungs daily. 3 each 3   HYDROmorphone (DILAUDID) 2 MG tablet Take 1 tablet (2 mg total) by mouth every 4 (four) hours as needed for severe pain (pain score 7-10) (prn post op pain not controlled by usual percocet). 20 tablet 0   hyoscyamine (LEVSIN/SL) 0.125 MG SL tablet Place 1 tablet (0.125 mg total) under the tongue every 6 (six) hours as needed. 30 tablet 0   levothyroxine (SYNTHROID) 25 MCG tablet TAKE 1 TABLET(25 MCG) BY MOUTH DAILY 90 tablet 0   linaclotide (LINZESS) 145 MCG CAPS capsule Take 1 capsule (145 mcg total) by mouth daily as needed (constipation).     memantine (NAMENDA) 10 MG tablet TAKE 1 TABLET(10 MG) BY MOUTH DAILY FOR 14 DAYS THEN TAKE 1 TABLET(10 MG) BY MOUTH TWICE DAILY FOR 16 DAYS (Patient taking differently: Take 5 mg by mouth at bedtime.) 46 tablet 0   montelukast (SINGULAIR) 10 MG tablet Take 1 tablet (10 mg total) by mouth at bedtime. 90 tablet 3   ondansetron (ZOFRAN) 4 MG tablet Take 4 mg by mouth every 8 (eight) hours as needed for vomiting or nausea.     ondansetron (ZOFRAN) 4 MG tablet Take 1 tablet (4 mg total)  by mouth every 8 (eight) hours as needed for nausea or  vomiting. 10 tablet 0   OVER THE COUNTER MEDICATION Take 1 tablet by mouth in the morning. Salt Supplement     oxyCODONE-acetaminophen (PERCOCET) 10-325 MG tablet Take 1 tablet by mouth every 8 (eight) hours as needed for pain.     pantoprazole (PROTONIX) 40 MG tablet TAKE 1 TABLET(40 MG) BY MOUTH DAILY 90 tablet 0   polyethylene glycol (MIRALAX / GLYCOLAX) 17 g packet Take 17 g by mouth daily.     potassium chloride (MICRO-K) 10 MEQ CR capsule Take 2 capsules (20 mEq total) by mouth 2 (two) times daily. 360 capsule 1   pregabalin (LYRICA) 25 MG capsule Take 1 capsule (25 mg total) by mouth 3 (three) times daily. (Patient taking differently: Take 25 mg by mouth at bedtime.) 90 capsule 2   promethazine (PHENERGAN) 25 MG tablet TAKE 1 TABLET BY MOUTH EVERY 8 HOURS IF NEEDED 20 tablet 0   rizatriptan (MAXALT) 10 MG tablet Take 10 mg by mouth as needed for migraine.     sennosides-docusate sodium (SENOKOT-S) 8.6-50 MG tablet Take 1 tablet by mouth daily. 30 tablet 3   vitamin B-12 (CYANOCOBALAMIN) 500 MCG tablet Take 500 mcg by mouth daily at 12 noon.     denosumab (PROLIA) 60 MG/ML SOSY injection Inject 60 mg into the skin every 6 (six) months. (Patient not taking: Reported on 02/13/2023) 1 mL 1   No facility-administered medications prior to visit.        Objective:   Physical Exam  Vitals:   02/13/23 1336  BP: (!) 120/52  Pulse: 78  SpO2: 97%  Weight: 95 lb 3.2 oz (43.2 kg)  Height: 4\' 10"  (1.473 m)    Gen: Pleasant, thin, in no distress,  normal affect  ENT: No lesions,  mouth clear,  oropharynx clear, no postnasal drip  Neck: No JVD, no stridor  Lungs: No use of accessory muscles, bilateral scattered rhonchi.  She does have some end expiratory wheeze on forced expiration  Cardiovascular: RRR, heart sounds normal, no murmur or gallops, no peripheral edema  Musculoskeletal: No deformities, no cyanosis or clubbing  Neuro: alert, awake, non focal  Skin: Warm, no lesions or  rash, venous stasis changes on her ankles      Assessment & Plan:  Pulmonary nodules Pulmonary Nodular Disease Multiple bilateral pulmonary nodules, some stable and some slightly decreased in size. One nodule in the right upper lobe increased from 4mm to 8mm in a short period, then stable on the most recent CT. History of cavitary aspergillosis treated with Itraconazole. Uncertainty if all Aspergillus was eradicated.  The history and changes on CT would be inconsistent with malignancy but possible. -Repeat CT scan of the chest at the end of July 2025 to monitor nodules.   COPD (chronic obstructive pulmonary disease) (HCC) COPD Stable on Trelegy and Albuterol as needed. Recent upper respiratory symptoms treated with Doxycycline. -Continue current COPD management. -Complete course of Doxycycline.  TOBACCO USER Tobacco Use Currently smoking half a pack per day. -Reduce to 7 cigarettes per day by next visit.   Levy Pupa, MD, PhD 02/13/2023, 2:07 PM [facility] [phone] or if no answer before 7:00PM call [phone] For any issues after 7:00PM please call eLink [phone]

## 2023-02-13 NOTE — Assessment & Plan Note (Signed)
COPD Stable on Trelegy and Albuterol as needed. Recent upper respiratory symptoms treated with Doxycycline. -Continue current COPD management. -Complete course of Doxycycline.

## 2023-02-13 NOTE — Patient Instructions (Signed)
VISIT SUMMARY:  Today, we reviewed your pulmonary nodular disease, COPD, and general health. We discussed your recent CT scans, current medications, and smoking habits. We also planned for a follow-up CT scan and your next appointment.  YOUR PLAN:  -PULMONARY NODULAR DISEASE: Pulmonary nodular disease involves the presence of small growths in the lungs. Your recent CT scan showed that most of your nodules are stable or slightly decreased in size. We will repeat the CT scan at the end of July 2025 to monitor these nodules.  -COPD: Chronic Obstructive Pulmonary Disease (COPD) is a long-term lung condition that makes it hard to breathe. Your COPD is stable with your current medications, Trelegy and Albuterol. You are also taking Doxycycline for recent upper respiratory symptoms. Please continue your current COPD management and complete the course of Doxycycline.  -TOBACCO USE: Smoking can worsen your lung conditions. You are currently smoking half a pack per day. Please try to reduce your smoking to 7 cigarettes per day by your next visit.  -GENERAL HEALTH MAINTENANCE: We will review the results of your CT scan and discuss your overall health at your next appointment in early August 2025.  INSTRUCTIONS:  Please schedule your next appointment for early August 2025 to discuss the results of your CT scan. Continue your current medications and try to reduce your smoking to 7 cigarettes per day.

## 2023-02-13 NOTE — Assessment & Plan Note (Signed)
Pulmonary Nodular Disease Multiple bilateral pulmonary nodules, some stable and some slightly decreased in size. One nodule in the right upper lobe increased from 4mm to 8mm in a short period, then stable on the most recent CT. History of cavitary aspergillosis treated with Itraconazole. Uncertainty if all Aspergillus was eradicated.  The history and changes on CT would be inconsistent with malignancy but possible. -Repeat CT scan of the chest at the end of July 2025 to monitor nodules.

## 2023-02-20 ENCOUNTER — Other Ambulatory Visit: Payer: Self-pay

## 2023-02-20 DIAGNOSIS — E039 Hypothyroidism, unspecified: Secondary | ICD-10-CM

## 2023-02-28 DIAGNOSIS — R634 Abnormal weight loss: Secondary | ICD-10-CM | POA: Diagnosis not present

## 2023-02-28 DIAGNOSIS — K5900 Constipation, unspecified: Secondary | ICD-10-CM | POA: Diagnosis not present

## 2023-02-28 DIAGNOSIS — R1310 Dysphagia, unspecified: Secondary | ICD-10-CM | POA: Diagnosis not present

## 2023-02-28 DIAGNOSIS — K219 Gastro-esophageal reflux disease without esophagitis: Secondary | ICD-10-CM | POA: Diagnosis not present

## 2023-02-28 DIAGNOSIS — R109 Unspecified abdominal pain: Secondary | ICD-10-CM | POA: Diagnosis not present

## 2023-02-28 DIAGNOSIS — R112 Nausea with vomiting, unspecified: Secondary | ICD-10-CM | POA: Diagnosis not present

## 2023-02-28 DIAGNOSIS — J449 Chronic obstructive pulmonary disease, unspecified: Secondary | ICD-10-CM | POA: Diagnosis not present

## 2023-03-01 ENCOUNTER — Other Ambulatory Visit: Payer: Self-pay | Admitting: Family Medicine

## 2023-03-01 DIAGNOSIS — E119 Type 2 diabetes mellitus without complications: Secondary | ICD-10-CM

## 2023-03-05 ENCOUNTER — Other Ambulatory Visit: Payer: Self-pay | Admitting: Family Medicine

## 2023-03-05 DIAGNOSIS — E119 Type 2 diabetes mellitus without complications: Secondary | ICD-10-CM

## 2023-03-06 DIAGNOSIS — M6281 Muscle weakness (generalized): Secondary | ICD-10-CM | POA: Diagnosis not present

## 2023-03-06 DIAGNOSIS — M25611 Stiffness of right shoulder, not elsewhere classified: Secondary | ICD-10-CM | POA: Diagnosis not present

## 2023-03-06 DIAGNOSIS — M25511 Pain in right shoulder: Secondary | ICD-10-CM | POA: Diagnosis not present

## 2023-03-07 ENCOUNTER — Encounter: Payer: Self-pay | Admitting: Vascular Surgery

## 2023-03-12 DIAGNOSIS — M6281 Muscle weakness (generalized): Secondary | ICD-10-CM | POA: Diagnosis not present

## 2023-03-12 DIAGNOSIS — M25611 Stiffness of right shoulder, not elsewhere classified: Secondary | ICD-10-CM | POA: Diagnosis not present

## 2023-03-12 DIAGNOSIS — M25511 Pain in right shoulder: Secondary | ICD-10-CM | POA: Diagnosis not present

## 2023-03-19 ENCOUNTER — Other Ambulatory Visit: Payer: Self-pay

## 2023-03-19 DIAGNOSIS — M25511 Pain in right shoulder: Secondary | ICD-10-CM | POA: Diagnosis not present

## 2023-03-19 DIAGNOSIS — M6281 Muscle weakness (generalized): Secondary | ICD-10-CM | POA: Diagnosis not present

## 2023-03-19 DIAGNOSIS — M25611 Stiffness of right shoulder, not elsewhere classified: Secondary | ICD-10-CM | POA: Diagnosis not present

## 2023-03-20 DIAGNOSIS — G43909 Migraine, unspecified, not intractable, without status migrainosus: Secondary | ICD-10-CM | POA: Diagnosis not present

## 2023-03-22 DIAGNOSIS — S22070A Wedge compression fracture of T9-T10 vertebra, initial encounter for closed fracture: Secondary | ICD-10-CM | POA: Diagnosis not present

## 2023-03-22 DIAGNOSIS — M8008XA Age-related osteoporosis with current pathological fracture, vertebra(e), initial encounter for fracture: Secondary | ICD-10-CM | POA: Diagnosis not present

## 2023-03-26 DIAGNOSIS — M25511 Pain in right shoulder: Secondary | ICD-10-CM | POA: Diagnosis not present

## 2023-03-26 DIAGNOSIS — M25611 Stiffness of right shoulder, not elsewhere classified: Secondary | ICD-10-CM | POA: Diagnosis not present

## 2023-03-26 DIAGNOSIS — M6281 Muscle weakness (generalized): Secondary | ICD-10-CM | POA: Diagnosis not present

## 2023-03-26 NOTE — Progress Notes (Unsigned)
 Subjective:  Patient ID: Allison Jimenez, female    DOB: 1950-09-23  Age: 73 y.o. MRN: [id number]  Chief Complaint  Patient presents with   Medical Management of Chronic Issues   Discussed the use of AI scribe software for clinical note transcription with the patient, who gave verbal consent to proceed.  History of Present Illness   The patient, with a history of shoulder surgery, arthritis, carpal tunnel syndrome, and migraines, presents with multiple complaints. She reports persistent pain following shoulder surgery, which is currently managed with physical therapy and oxycodone. The patient also mentions difficulty with daily activities due to the dominance of the affected arm.  In addition to post-surgical pain, the patient is experiencing arthritis-related discomfort, particularly in her hands. She suspects carpal tunnel syndrome may be contributing to her hand pain. The patient plans to consult with another doctor regarding these issues.  The patient's back pain, despite previous interventions, remains a significant concern. She reports that the pain is not fully controlled by oxycodone and affects both the upper and lower back. The patient expresses a desire for anti-inflammatory medication to manage her arthritis and back pain, but has been advised against it due to gastrointestinal issues.  The patient also reports migraines, which have been more frequent recently. She attributes this increase to a lapse in her Aimovig prescription. The patient is planning to restart Aimovig when she sees neurology.  Tobacco use: currently at 10-12 cigarettes per day. She acknowledges the health risks but expresses difficulty in quitting.     GAD: prozac 20 mg 2 daily, off xanax.    Hypertension: on chlorthalidone 25 mg daily and carvedilol 25 mg one twice daily.  Persistent lumbar back pain: hydrocodone/apap 5/325 mg 1 pill twice a day as needed. Patient is taking 1/2 pill twice daily. Given by Dr.  Wells [facility] office.  Takes tylenol  Hyperlipidemia: on lipitor 20 mg once daily before bed. Marland Kitchen  GERD: on pepcid 40 mg once before bed. Protonix 40 mg daily Allergic rhinitis: singulair 10 mg daily.  Patient HAD aspergillis in lungs and completed voriconazole.  She follows with pulmonology.   Diabetes: last A1c was 6. Not eating healthy. Unable to exercise due to foot and back pain.  Not checking sugars.   Tobacco: 10-12 cigarettes per day. ON trelegy one inhalation daily for copd.      03/27/2023   11:03 AM 12/24/2022   11:37 AM 09/27/2022    4:21 PM 09/17/2022    3:07 PM 01/31/2022    2:27 PM  Depression screen PHQ 2/9  Decreased Interest 2 0 1 1 0  Down, Depressed, Hopeless 1 0 1 0 0  PHQ - 2 Score 3 0 2 1 0  Altered sleeping 1 0  1   Tired, decreased energy 2 0  1   Change in appetite 1 0     Feeling bad or failure about yourself  1 0  0   Trouble concentrating 0 0  0   Moving slowly or fidgety/restless 1 0  0   Suicidal thoughts 1 0  0   PHQ-9 Score 10 0  3   Difficult doing work/chores Somewhat difficult Not difficult at all  Not difficult at all         03/27/2023   11:02 AM  Fall Risk   Falls in the past year? 1  Number falls in past yr: 1  Injury with Fall? 1  Risk for fall due to : History of fall(s);Impaired  mobility  Follow up Falls prevention discussed    Patient Care Team: Blane Ohara, MD as PCP - General (Family Medicine) Bevelyn Ngo, NP as Nurse Practitioner (Pulmonary Disease) Quinn Plowman, PA-C as Physician Assistant (General Practice) Misenheimer, Marcial Pacas, MD as Consulting Physician (Gastroenterology) Sinda Du, MD as Consulting Physician (Ophthalmology) Florene Glen (Physician Assistant) Omar Person, MD as Consulting Physician (Pulmonary Disease) Weston Settle, MD as Consulting Physician (Oncology)   Review of Systems  Constitutional:  Negative for chills, fatigue and fever.  HENT:  Negative for congestion, ear pain and  sore throat.   Respiratory:  Positive for cough. Negative for shortness of breath.   Cardiovascular:  Negative for chest pain.  Gastrointestinal:  Negative for abdominal pain, constipation, diarrhea, nausea and vomiting.  Genitourinary:  Negative for dysuria and urgency.  Skin:  Negative for rash.  Neurological:  Negative for dizziness and headaches.  Psychiatric/Behavioral:  Negative for dysphoric mood. The patient is not nervous/anxious.     Current Outpatient Medications on File Prior to Visit  Medication Sig Dispense Refill   acetaminophen (TYLENOL) 500 MG tablet Take 500-1,000 mg by mouth every 6 (six) hours as needed for mild pain, moderate pain or headache.     albuterol (VENTOLIN HFA) 108 (90 Base) MCG/ACT inhaler Inhale 2 puffs into the lungs every 6 (six) hours as needed for shortness of breath or wheezing. 8 g 2   ALPRAZolam (XANAX) 0.25 MG tablet Take 0.5-1 tablets (0.125-0.25 mg total) by mouth daily as needed for anxiety or sleep. 30 tablet 1   atorvastatin (LIPITOR) 20 MG tablet TAKE 1 TABLET(20 MG) BY MOUTH DAILY 90 tablet 1   calcitonin, salmon, (MIACALCIN/FORTICAL) 200 UNIT/ACT nasal spray Place 1 spray into alternate nostrils daily.     Carboxymethylcellul-Glycerin (LUBRICATING EYE DROPS OP) Place 1 drop into both eyes daily.     carvedilol (COREG) 25 MG tablet TAKE 1 TABLET(25 MG) BY MOUTH TWICE DAILY WITH A MEAL 180 tablet 0   cetirizine (ZYRTEC) 10 MG tablet Take 10 mg by mouth daily as needed for allergies.     chlorthalidone (HYGROTON) 25 MG tablet Take 1 tablet (25 mg total) by mouth daily. 90 tablet 1   Cholecalciferol (VITAMIN D) 50 MCG (2000 UT) CAPS Take 2,000 Units by mouth daily.     conjugated estrogens (PREMARIN) vaginal cream Place 1 applicator vaginally daily as needed (dryness / burning).     cyclobenzaprine (FLEXERIL) 10 MG tablet Take 1 tablet (10 mg total) by mouth 3 (three) times daily as needed for muscle spasms. 30 tablet 1   diclofenac Sodium  (VOLTAREN) 1 % GEL Apply 1 Application topically 4 (four) times daily as needed (pain).     famotidine (PEPCID) 40 MG tablet TAKE 1 TABLET(40 MG) BY MOUTH AT BEDTIME 90 tablet 3   fluorometholone (FML) 0.1 % ophthalmic suspension INSTILL 1 DROP INTO LEFT EYE THREE TIMES DAILY FOR 3 DAYS THEN TWICE DAILY FOR 3 DAYS THEN EVERY DAY FOR 3 DAYS THEN STOP     fluticasone (FLONASE) 50 MCG/ACT nasal spray SHAKE LIQUID AND USE 2 SPRAYS IN EACH NOSTRIL DAILY (Patient taking differently: Place 2 sprays into both nostrils daily as needed for allergies.) 16 g 6   Fluticasone-Umeclidin-Vilant (TRELEGY ELLIPTA) 100-62.5-25 MCG/ACT AEPB Inhale 1 puff into the lungs daily. 3 each 3   levothyroxine (SYNTHROID) 25 MCG tablet TAKE 1 TABLET(25 MCG) BY MOUTH DAILY 90 tablet 0   montelukast (SINGULAIR) 10 MG tablet Take 1 tablet (10 mg  total) by mouth at bedtime. 90 tablet 3   ondansetron (ZOFRAN) 4 MG tablet Take 1 tablet (4 mg total) by mouth every 8 (eight) hours as needed for nausea or vomiting. 10 tablet 0   OVER THE COUNTER MEDICATION Take 1 tablet by mouth in the morning. Salt Supplement     oxyCODONE-acetaminophen (PERCOCET) 10-325 MG tablet Take 1 tablet by mouth every 8 (eight) hours as needed for pain.     pantoprazole (PROTONIX) 40 MG tablet TAKE 1 TABLET(40 MG) BY MOUTH DAILY 90 tablet 0   polyethylene glycol (MIRALAX / GLYCOLAX) 17 g packet Take 17 g by mouth daily.     potassium chloride (MICRO-K) 10 MEQ CR capsule Take 2 capsules (20 mEq total) by mouth 2 (two) times daily. 360 capsule 1   pregabalin (LYRICA) 25 MG capsule Take 1 capsule (25 mg total) by mouth 3 (three) times daily. (Patient taking differently: Take 25 mg by mouth at bedtime.) 90 capsule 2   promethazine (PHENERGAN) 25 MG tablet TAKE 1 TABLET BY MOUTH EVERY 8 HOURS IF NEEDED 20 tablet 0   rizatriptan (MAXALT) 10 MG tablet Take 10 mg by mouth as needed for migraine.     vitamin B-12 (CYANOCOBALAMIN) 500 MCG tablet Take 500 mcg by mouth daily  at 12 noon.     No current facility-administered medications on file prior to visit.   Past Medical History:  Diagnosis Date   Acute blood loss anemia (ABLA) 09/05/2020   Allergy    ANXIETY 06/05/2006   Arthritis    SHOULDERS    BACK PAIN 01/05/2010   Carotid artery occlusion    Cataract    bilateral, left worse   Centrilobular emphysema (HCC)    COPD (chronic obstructive pulmonary disease) (HCC)    Depression    DIVERTICULOSIS, COLON 06/05/2006   DIZZINESS OR VERTIGO 06/05/2006   positional   GERD (gastroesophageal reflux disease)    Headache    Migraines   Hearing loss in left ear    no hearing aid   History of blood transfusion 08/2020   History of kidney stones    passed stones   HYPERLIPIDEMIA 06/05/2006   Hypertension    HYPOKALEMIA 12/14/2008   Hypothyroidism    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   Qualifier: Diagnosis of  By: Amador Cunas  MD, Janett Labella    Pneumonia    Pre-diabetes    TOBACCO USER 11/25/2007   Past Surgical History:  Procedure Laterality Date   APPENDECTOMY     bilateral cataract surgery      BRONCHIAL BIOPSY  09/28/2021   Procedure: BRONCHIAL BIOPSIES;  Surgeon: Omar Person, MD;  Location: [facility] END[facility]COPY;  Service: Pulmonary;;   BRONCHIAL NEEDLE ASPIRATION BIOPSY  09/28/2021   Procedure: BRONCHIAL NEEDLE ASPIRATION BIOPSIES;  Surgeon: Omar Person, MD;  Location: [facility] ENDOSCOPY;  Service: Pulmonary;;   BRONCHIAL WASHINGS  09/28/2021   Procedure: BRONCHIAL WASHINGS;  Surgeon: Omar Person, MD;  Location: [facility] ENDOSCOPY;  Service: Pulmonary;;   CESAREAN SECTION     x1   CHOLECYSTECTOMY     COLONOSCOPY     COSMETIC SURGERY     CYSTOSCOPY W/ URETERAL STENT PLACEMENT Right 07/29/2022   Procedure: CYSTOSCOPY WITH RETROGRADE PYELOGRAM/URETERAL STENT PLACEMENT;  Surgeon: Malen Gauze, MD;  Location: [facility] ORS;  Service: Urology;  Laterality: Right;   CYSTOSCOPY/URETEROSCOPY/HOLMIUM LASER/STENT PLACEMENT Right 09/05/2022   Procedure:  RIGHT URETEROSCOPY/HOLMIUM LASER/STENT PLACEMENT;  Surgeon: Despina Arias, MD;  Location: [facility] ORS;  Service: Urology;  Laterality:  Right;   mastoid surgery  1992   shunt in mastoid- endolymphatic sac in left ear    MRI  07/11/2020   x several, last one located in CE   REVERSE SHOULDER ARTHROPLASTY Right 01/24/2023   Procedure: REVERSE SHOULDER ARTHROPLASTY;  Surgeon: Francena Hanly, MD;  Location: [facility] ORS;  Service: Orthopedics;  Laterality: Right;  120 min   TUBAL LIGATION     UPPER GI ENDOSCOPY     vocal cord surgery     nodule x2    Family History  Problem Relation Age of Onset   Breast cancer Mother    Dementia Mother    Heart disease Mother    Hypertension Mother    Thyroid disease Mother    Alzheimer's disease Father    Heart disease Father    Hypertension Father    Thyroid disease Father    Diabetes Sister    Colon cancer Neg Hx    Rectal cancer Neg Hx    Stomach cancer Neg Hx    Colon polyps Neg Hx    Esophageal cancer Neg Hx    Social History   Socioeconomic History   Marital status: Divorced    Spouse name: Not on file   Number of children: 1   Years of education: 13   Highest education level: Not on file  Occupational History   Occupation: ASSISTANT CREDIT MANAGER    Employer: [employer]    Comment: Retired  Tobacco Use   Smoking status: Every Day    Current packs/day: 0.50    Average packs/day: 0.5 packs/day for 49.0 years (24.5 ttl pk-yrs)    Types: Cigarettes   Smokeless tobacco: Never   Tobacco comments:    3/4 ppd 08/30/21 Vernie Murders, CMA  Vaping Use   Vaping status: Former   Start date: 01/01/2010   Quit date: 08/01/2021   Substances: Nicotine   Devices: menthol/fruit flavor  Substance and Sexual Activity   Alcohol use: No    Alcohol/week: 0.0 standard drinks of alcohol   Drug use: No   Sexual activity: Not Currently    Birth control/protection: Post-menopausal  Other Topics Concern   Not on file  Social History Narrative    Patient lives at home alone. Patient has a Biochemist, clinical. Patient is divorced .   Patient is retired.   Education high school.   Right handed.   Caffeine none.   Social Drivers of Corporate investment banker Strain: Low Risk  (10/31/2022)   Overall Financial Resource Strain (CARDIA)    Difficulty of Paying Living Expenses: Not hard at all  Food Insecurity: No Food Insecurity (09/27/2022)   Hunger Vital Sign    Worried About Running Out of Food in the Last Year: Never true    Ran Out of Food in the Last Year: Never true  Transportation Needs: No Transportation Needs (09/27/2022)   PRAPARE - Administrator, Civil Service (Medical): No    Lack of Transportation (Non-Medical): No  Physical Activity: Inactive (10/31/2022)   Exercise Vital Sign    Days of Exercise per Week: 0 days    Minutes of Exercise per Session: 0 min  Stress: No Stress Concern Present (10/31/2022)   [employer] of Occupational Health - Occupational Stress Questionnaire    Feeling of Stress : Not at all  Social Connections: Socially Isolated (10/31/2022)   Social Connection and Isolation Panel [NHANES]    Frequency of Communication with Friends and Family: Three times  a week    Frequency of Social Gatherings with Friends and Family: Three times a week    Attends Religious Services: Never    Active Member of Clubs or Organizations: No    Attends Banker Meetings: Never    Marital Status: Divorced    Objective:  BP (!) 118/50   Pulse 69   Temp (!) 97.2 F (36.2 C)   Resp 14   Ht 4\' 10"  (1.473 m)   Wt 98 lb (44.5 kg)   SpO2 96%   BMI 20.48 kg/m      03/27/2023   11:33 AM 03/27/2023   11:02 AM 03/27/2023   10:49 AM  BP/Weight  Systolic BP 118 115 100  Diastolic BP 50 58 40  Wt. (Lbs)   98  BMI   20.48 kg/m2    Physical Exam Vitals reviewed.  Constitutional:      Appearance: Normal appearance. She is normal weight.  Neck:     Vascular: No carotid bruit.   Cardiovascular:     Rate and Rhythm: Normal rate and regular rhythm.     Heart sounds: Normal heart sounds.  Pulmonary:     Effort: Pulmonary effort is normal. No respiratory distress.     Breath sounds: Normal breath sounds.  Abdominal:     General: Abdomen is flat. Bowel sounds are normal.     Palpations: Abdomen is soft.     Tenderness: There is no abdominal tenderness.  Neurological:     Mental Status: She is alert and oriented to person, place, and time.     Gait: Gait abnormal (uses a walker.).  Psychiatric:        Mood and Affect: Mood normal.        Behavior: Behavior normal.     Diabetic Foot Exam - Simple   No data filed      Lab Results  Component Value Date   WBC 9.1 03/27/2023   HGB 11.5 03/27/2023   HCT 35.5 03/27/2023   PLT 335 03/27/2023   GLUCOSE 84 03/27/2023   CHOL 104 03/27/2023   TRIG 102 03/27/2023   HDL 41 03/27/2023   LDLDIRECT 151.9 09/13/2011   LDLCALC 44 03/27/2023   ALT 11 03/27/2023   AST 14 03/27/2023   NA 140 03/27/2023   K 4.4 03/27/2023   CL 102 03/27/2023   CREATININE 0.60 03/27/2023   BUN 13 03/27/2023   CO2 19 (L) 03/27/2023   TSH 2.640 09/17/2022   INR 1.1 07/29/2022   HGBA1C 5.1 03/27/2023      Assessment & Plan:   Essential hypertension Assessment & Plan: No changes to medicines. chlorthalidone 25 mg daily and carvedilol 12.5 mg one twice daily. Continue to work on eating a healthy diet and exercise.  Labs drawn today.    Orders: -     CBC with Differential/Platelet -     Comprehensive metabolic panel with GFR  Chronic obstructive pulmonary disease, unspecified COPD type (HCC) Assessment & Plan: Has COPD and is using Trelegy inconsistently. Reports a wheeze and is concerned about the cost of Trelegy after insurance changes. Advised to use Trelegy consistently and to check the cost with insurance. - Encourage consistent use of Trelegy - Instruct to check cost of Trelegy and explore patient assistance programs  if needed   Impaired fasting glucose Assessment & Plan: Recommend continue to work on eating healthy diet and exercise.   Orders: -     Hemoglobin A1c  Hypothyroidism (acquired) Assessment &  Plan: Previously well controlled Continue Synthroid at current dose     Mixed hyperlipidemia Assessment & Plan: Not quite at goal. Continue Atorvastatin 20 mg daily.  Continue to work on eating a healthy diet and exercise.  Labs drawn today.    Orders: -     Lipid panel  Nicotine dependence, cigarettes, uncomplicated Assessment & Plan: Smokes 10-12 cigarettes per day and has tried nicotine patches in the past. Acknowledges the need to quit but is not currently attempting cessation. - Discuss smoking cessation options at future appointments   GAD (generalized anxiety disorder) Assessment & Plan: The current medical regimen is effective;  continue present plan and medications.   Orders: -     FLUoxetine HCl; Take 3 capsules (60 mg total) by mouth daily.  Dispense: 270 capsule; Refill: 0  Bilateral hand pain Assessment & Plan: Hand pain likely due to arthritis and carpal tunnel syndrome, with the left hand worse than the right. Plans to see Dr. Elonda Husky for further evaluation. - Refer to Dr. Elonda Husky for evaluation of hand pain   Lumbar back pain Assessment & Plan: Chronic back pain likely due to arthritis and poor posture, with a history of spinal curvature and previous surgeries. Oxycodone is used for pain management, but additional relief is needed. Oral anti-inflammatories are contraindicated due to gastrointestinal issues. Diclofenac patch is considered as an alternative. - Prescribe diclofenac patch for back pain management - Instruct to inquire with pharmacist about cutting patches for application to multiple sites   Major depressive disorder, recurrent episode, mild (HCC) Assessment & Plan: On Prozac, which has been increased and is helping, though she still experiences bad  days. Has not used Xanax recently due to concerns about mixing it with oxycodone. - Refill Prozac prescription with a 90-day supply   Age-related osteoporosis with current pathological fracture with routine healing, subsequent encounter Assessment & Plan: Osteoporosis contributing to decreased height and frail bone structure. - Discuss bone health management at future appointments   Other orders -     Diclofenac Epolamine; Place 1 patch onto the skin 2 (two) times daily.  Dispense: 60 patch; Refill: 5     Meds ordered this encounter  Medications   diclofenac (FLECTOR) 1.3 % PTCH    Sig: Place 1 patch onto the skin 2 (two) times daily.    Dispense:  60 patch    Refill:  5   FLUoxetine (PROZAC) 20 MG capsule    Sig: Take 3 capsules (60 mg total) by mouth daily.    Dispense:  270 capsule    Refill:  0    Orders Placed This Encounter  Procedures   CBC with Differential/Platelet   Comprehensive metabolic panel   Hemoglobin A1c   Lipid panel     Follow-up: Return in about 3 months (around 06/27/2023) for chronic follow up.   I,Marla I Leal-Borjas,acting as a scribe for Blane Ohara, MD.,have documented all relevant documentation on the behalf of Blane Ohara, MD,as directed by  Blane Ohara, MD while in the presence of Blane Ohara, MD.   An After Visit Summary was printed and given to the patient.  I attest that I have reviewed this visit and agree with the plan scribed by my staff.   Blane Ohara, MD [facility] [phone]

## 2023-03-27 ENCOUNTER — Ambulatory Visit: Payer: Medicare PPO | Admitting: Family Medicine

## 2023-03-27 ENCOUNTER — Encounter: Payer: Self-pay | Admitting: Family Medicine

## 2023-03-27 ENCOUNTER — Telehealth: Payer: Self-pay

## 2023-03-27 VITALS — BP 118/50 | HR 69 | Temp 97.2°F | Resp 14 | Ht <= 58 in | Wt 98.0 lb

## 2023-03-27 DIAGNOSIS — F411 Generalized anxiety disorder: Secondary | ICD-10-CM | POA: Diagnosis not present

## 2023-03-27 DIAGNOSIS — E039 Hypothyroidism, unspecified: Secondary | ICD-10-CM

## 2023-03-27 DIAGNOSIS — G8918 Other acute postprocedural pain: Secondary | ICD-10-CM

## 2023-03-27 DIAGNOSIS — J449 Chronic obstructive pulmonary disease, unspecified: Secondary | ICD-10-CM

## 2023-03-27 DIAGNOSIS — M545 Low back pain, unspecified: Secondary | ICD-10-CM | POA: Diagnosis not present

## 2023-03-27 DIAGNOSIS — G43801 Other migraine, not intractable, with status migrainosus: Secondary | ICD-10-CM

## 2023-03-27 DIAGNOSIS — E782 Mixed hyperlipidemia: Secondary | ICD-10-CM

## 2023-03-27 DIAGNOSIS — M79641 Pain in right hand: Secondary | ICD-10-CM

## 2023-03-27 DIAGNOSIS — M8000XD Age-related osteoporosis with current pathological fracture, unspecified site, subsequent encounter for fracture with routine healing: Secondary | ICD-10-CM

## 2023-03-27 DIAGNOSIS — I10 Essential (primary) hypertension: Secondary | ICD-10-CM

## 2023-03-27 DIAGNOSIS — F17210 Nicotine dependence, cigarettes, uncomplicated: Secondary | ICD-10-CM | POA: Diagnosis not present

## 2023-03-27 DIAGNOSIS — R7301 Impaired fasting glucose: Secondary | ICD-10-CM

## 2023-03-27 DIAGNOSIS — M79642 Pain in left hand: Secondary | ICD-10-CM

## 2023-03-27 DIAGNOSIS — F330 Major depressive disorder, recurrent, mild: Secondary | ICD-10-CM

## 2023-03-27 MED ORDER — FLUOXETINE HCL 20 MG PO CAPS: 60.0000 mg | ORAL_CAPSULE | Freq: Every day | ORAL | 0 refills | Status: DC

## 2023-03-27 MED ORDER — DICLOFENAC EPOLAMINE 1.3 % EX PTCH: 1.0000 | MEDICATED_PATCH | Freq: Two times a day (BID) | CUTANEOUS | 5 refills | Status: DC

## 2023-03-27 NOTE — Telephone Encounter (Signed)
 Patient had questions regarding her "bone" medication, Evenity? She stated she had spoke with you last year about one of the medications being too expensive but is unsure what she was supposed to do. Can you call and speak with her? She is aware that you are not in the office today.

## 2023-03-27 NOTE — Assessment & Plan Note (Signed)
 Recommend smoking cessation.

## 2023-03-27 NOTE — Assessment & Plan Note (Signed)
 Stable on Trelegy and Albuterol as needed.  -Continue current COPD management.

## 2023-03-27 NOTE — Assessment & Plan Note (Addendum)
 Previously well controlled Continue Synthroid at current dose

## 2023-03-27 NOTE — Assessment & Plan Note (Signed)
 Recommend continue to work on eating healthy diet and exercise.

## 2023-03-27 NOTE — Assessment & Plan Note (Signed)
 Not quite at goal. Continue Atorvastatin 20 mg daily.  Continue to work on eating a healthy diet and exercise.  Labs drawn today.

## 2023-03-27 NOTE — Assessment & Plan Note (Signed)
No changes to medicines. chlorthalidone 25 mg daily and carvedilol 12.5 mg one twice daily. Continue to work on eating a healthy diet and exercise.  Labs drawn today.

## 2023-03-28 ENCOUNTER — Encounter: Payer: Self-pay | Admitting: Family Medicine

## 2023-03-28 DIAGNOSIS — M79642 Pain in left hand: Secondary | ICD-10-CM | POA: Insufficient documentation

## 2023-03-28 DIAGNOSIS — M25511 Pain in right shoulder: Secondary | ICD-10-CM | POA: Diagnosis not present

## 2023-03-28 DIAGNOSIS — G8918 Other acute postprocedural pain: Secondary | ICD-10-CM | POA: Insufficient documentation

## 2023-03-28 DIAGNOSIS — M25611 Stiffness of right shoulder, not elsewhere classified: Secondary | ICD-10-CM | POA: Diagnosis not present

## 2023-03-28 DIAGNOSIS — M6281 Muscle weakness (generalized): Secondary | ICD-10-CM | POA: Diagnosis not present

## 2023-03-28 DIAGNOSIS — M79641 Pain in right hand: Secondary | ICD-10-CM | POA: Insufficient documentation

## 2023-03-28 LAB — CBC WITH DIFFERENTIAL/PLATELET
Basophils Absolute: 0.1 10*3/uL (ref 0.0–0.2)
Basos: 1 %
EOS (ABSOLUTE): 0.3 10*3/uL (ref 0.0–0.4)
Eos: 4 %
Hematocrit: 35.5 % (ref 34.0–46.6)
Hemoglobin: 11.5 g/dL (ref 11.1–15.9)
Immature Grans (Abs): 0 10*3/uL (ref 0.0–0.1)
Immature Granulocytes: 0 %
Lymphocytes Absolute: 2.7 10*3/uL (ref 0.7–3.1)
Lymphs: 29 %
MCH: 29.3 pg (ref 26.6–33.0)
MCHC: 32.4 g/dL (ref 31.5–35.7)
MCV: 91 fL (ref 79–97)
Monocytes Absolute: 0.8 10*3/uL (ref 0.1–0.9)
Monocytes: 9 %
Neutrophils Absolute: 5.2 10*3/uL (ref 1.4–7.0)
Neutrophils: 57 %
Platelets: 335 10*3/uL (ref 150–450)
RBC: 3.92 x10E6/uL (ref 3.77–5.28)
RDW: 13.1 % (ref 11.7–15.4)
WBC: 9.1 10*3/uL (ref 3.4–10.8)

## 2023-03-28 LAB — LIPID PANEL
Chol/HDL Ratio: 2.5 ratio (ref 0.0–4.4)
Cholesterol, Total: 104 mg/dL (ref 100–199)
HDL: 41 mg/dL (ref >39)
LDL Chol Calc (NIH): 44 mg/dL (ref 0–99)
Triglycerides: 102 mg/dL (ref 0–149)
VLDL Cholesterol Cal: 19 mg/dL (ref 5–40)

## 2023-03-28 LAB — COMPREHENSIVE METABOLIC PANEL WITH GFR
ALT: 11 IU/L (ref 0–32)
AST: 14 IU/L (ref 0–40)
Albumin: 4.3 g/dL (ref 3.8–4.8)
Alkaline Phosphatase: 102 IU/L (ref 44–121)
BUN/Creatinine Ratio: 22 (ref 12–28)
BUN: 13 mg/dL (ref 8–27)
Bilirubin Total: 0.3 mg/dL (ref 0.0–1.2)
CO2: 19 mmol/L — ABNORMAL LOW (ref 20–29)
Calcium: 10 mg/dL (ref 8.7–10.3)
Chloride: 102 mmol/L (ref 96–106)
Creatinine, Ser: 0.6 mg/dL (ref 0.57–1.00)
Globulin, Total: 2.5 g/dL (ref 1.5–4.5)
Glucose: 84 mg/dL (ref 70–99)
Potassium: 4.4 mmol/L (ref 3.5–5.2)
Sodium: 140 mmol/L (ref 134–144)
Total Protein: 6.8 g/dL (ref 6.0–8.5)
eGFR: 96 mL/min/{1.73_m2} (ref >59)

## 2023-03-28 LAB — HEMOGLOBIN A1C
Est. average glucose Bld gHb Est-mCnc: 100 mg/dL
Hgb A1c MFr Bld: 5.1 % (ref 4.8–5.6)

## 2023-03-28 NOTE — Assessment & Plan Note (Signed)
 Recovering from shoulder surgery with ongoing pain. The right shoulder, previously frozen with severe damage, is improving post-surgery. Undergoing physical therapy twice a week. The left shoulder may require a cortisone injection, pending insurance considerations. - Continue physical therapy twice a week - Schedule follow-up with orthopedic surgeon for postoperative evaluation and potential left shoulder x-ray

## 2023-03-28 NOTE — Assessment & Plan Note (Signed)
 Smokes 10-12 cigarettes per day and has tried nicotine patches in the past. Acknowledges the need to quit but is not currently attempting cessation. - Discuss smoking cessation options at future appointments

## 2023-03-28 NOTE — Assessment & Plan Note (Signed)
 Experiences migraines and has been off Aimovig for two months, possibly contributing to headaches. Oxycodone use may cause rebound headaches. Rizatriptan is effective when taken at the onset of migraine aura. - Resume Aimovig for migraine prevention - Advise to take rizatriptan at the onset of migraine aura

## 2023-03-28 NOTE — Assessment & Plan Note (Signed)
 Chronic back pain likely due to arthritis and poor posture, with a history of spinal curvature and previous surgeries. Oxycodone is used for pain management, but additional relief is needed. Oral anti-inflammatories are contraindicated due to gastrointestinal issues. Diclofenac patch is considered as an alternative. - Prescribe diclofenac patch for back pain management - Instruct to inquire with pharmacist about cutting patches for application to multiple sites

## 2023-03-28 NOTE — Assessment & Plan Note (Signed)
 Osteoporosis contributing to decreased height and frail bone structure. - Discuss bone health management at future appointments

## 2023-03-28 NOTE — Assessment & Plan Note (Signed)
 Hand pain likely due to arthritis and carpal tunnel syndrome, with the left hand worse than the right. Plans to see Dr. Elonda Husky for further evaluation. - Refer to Dr. Elonda Husky for evaluation of hand pain

## 2023-03-28 NOTE — Assessment & Plan Note (Signed)
 Has COPD and is using Trelegy inconsistently. Reports a wheeze and is concerned about the cost of Trelegy after insurance changes. Advised to use Trelegy consistently and to check the cost with insurance. - Encourage consistent use of Trelegy - Instruct to check cost of Trelegy and explore patient assistance programs if needed

## 2023-03-28 NOTE — Assessment & Plan Note (Signed)
 The current medical regimen is effective;  continue present plan and medications.

## 2023-03-28 NOTE — Assessment & Plan Note (Signed)
 On Prozac, which has been increased and is helping, though she still experiences bad days. Has not used Xanax recently due to concerns about mixing it with oxycodone. - Refill Prozac prescription with a 90-day supply

## 2023-04-02 DIAGNOSIS — M25511 Pain in right shoulder: Secondary | ICD-10-CM | POA: Diagnosis not present

## 2023-04-02 DIAGNOSIS — G5602 Carpal tunnel syndrome, left upper limb: Secondary | ICD-10-CM | POA: Diagnosis not present

## 2023-04-02 DIAGNOSIS — M25611 Stiffness of right shoulder, not elsewhere classified: Secondary | ICD-10-CM | POA: Diagnosis not present

## 2023-04-02 DIAGNOSIS — M6281 Muscle weakness (generalized): Secondary | ICD-10-CM | POA: Diagnosis not present

## 2023-04-02 DIAGNOSIS — G5601 Carpal tunnel syndrome, right upper limb: Secondary | ICD-10-CM | POA: Diagnosis not present

## 2023-04-02 DIAGNOSIS — G5603 Carpal tunnel syndrome, bilateral upper limbs: Secondary | ICD-10-CM | POA: Diagnosis not present

## 2023-04-04 DIAGNOSIS — M6281 Muscle weakness (generalized): Secondary | ICD-10-CM | POA: Diagnosis not present

## 2023-04-04 DIAGNOSIS — M25511 Pain in right shoulder: Secondary | ICD-10-CM | POA: Diagnosis not present

## 2023-04-04 DIAGNOSIS — M25611 Stiffness of right shoulder, not elsewhere classified: Secondary | ICD-10-CM | POA: Diagnosis not present

## 2023-04-08 ENCOUNTER — Other Ambulatory Visit: Payer: Self-pay | Admitting: Oncology

## 2023-04-08 DIAGNOSIS — M25611 Stiffness of right shoulder, not elsewhere classified: Secondary | ICD-10-CM | POA: Diagnosis not present

## 2023-04-08 DIAGNOSIS — M6281 Muscle weakness (generalized): Secondary | ICD-10-CM | POA: Diagnosis not present

## 2023-04-08 DIAGNOSIS — D500 Iron deficiency anemia secondary to blood loss (chronic): Secondary | ICD-10-CM

## 2023-04-08 DIAGNOSIS — M25511 Pain in right shoulder: Secondary | ICD-10-CM | POA: Diagnosis not present

## 2023-04-08 NOTE — Progress Notes (Unsigned)
 [facility]  [address] [place],  [place]  [address] [phone]  Clinic Day:  04/09/2023  Referring physician: Blane Ohara, MD   HISTORY OF PRESENT ILLNESS:  The patient is a 73 y.o. female with iron deficiency anemia.  She comes in today to reassess her iron levels and her peripheral counts.  Of note, she last received IV iron in October 2024.  Since her last visit, the patient has been doing relatively well.  She continues to deny having any overt forms of blood loss.  This patient did undergo both an EGD and colonoscopy in October 2024.  2 duodenal AVMs were seen and ablated with her EGD.  Her colonoscopy revealed 1 polyp, which was removed.   PHYSICAL EXAM:  Blood pressure (!) 113/91, pulse 91, temperature 98.2 F (36.8 C), temperature source Oral, resp. rate 14, height 4\' 10"  (1.473 m), weight 92 lb 4.8 oz (41.9 kg), SpO2 97%. Wt Readings from Last 3 Encounters:  04/09/23 92 lb 4.8 oz (41.9 kg)  03/27/23 98 lb (44.5 kg)  02/13/23 95 lb 3.2 oz (43.2 kg)   Body mass index is 19.29 kg/m. Performance status (ECOG): 1 - Symptomatic but completely ambulatory Physical Exam Constitutional:      Appearance: Normal appearance. She is not ill-appearing.  HENT:     Mouth/Throat:     Mouth: Mucous membranes are moist.     Pharynx: Oropharynx is clear. No oropharyngeal exudate or posterior oropharyngeal erythema.  Cardiovascular:     Rate and Rhythm: Normal rate and regular rhythm.     Heart sounds: No murmur heard.    No friction rub. No gallop.  Pulmonary:     Effort: Pulmonary effort is normal. No respiratory distress.     Breath sounds: Normal breath sounds. No wheezing, rhonchi or rales.  Abdominal:     General: Bowel sounds are normal. There is no distension.     Palpations: Abdomen is soft. There is no mass.     Tenderness: There is no abdominal tenderness.  Musculoskeletal:        General: No swelling.     Right lower leg: No edema.      Left lower leg: No edema.  Lymphadenopathy:     Cervical: No cervical adenopathy.     Upper Body:     Right upper body: No supraclavicular or axillary adenopathy.     Left upper body: No supraclavicular or axillary adenopathy.     Lower Body: No right inguinal adenopathy. No left inguinal adenopathy.  Skin:    General: Skin is warm.     Coloration: Skin is not jaundiced.     Findings: No lesion or rash.  Neurological:     General: No focal deficit present.     Mental Status: She is alert and oriented to person, place, and time. Mental status is at baseline.  Psychiatric:        Mood and Affect: Mood normal.        Behavior: Behavior normal.        Thought Content: Thought content normal.   LABS:      Latest Ref Rng & Units 04/09/2023    1:38 PM 03/27/2023   11:42 AM 01/14/2023    1:54 PM  CBC  WBC 4.0 - 10.5 K/uL 8.3  9.1  8.4   Hemoglobin 12.0 - 15.0 g/dL 91.4  78.2  95.6   Hematocrit 36.0 - 46.0 % 39.3  35.5  38.4  Platelets 150 - 400 K/uL 327  335  342       Latest Ref Rng & Units 03/27/2023   11:42 AM 01/16/2023    4:08 PM 01/14/2023    1:54 PM  CMP  Glucose 70 - 99 mg/dL 84  782  956   BUN 8 - 27 mg/dL 13  13  14    Creatinine 0.57 - 1.00 mg/dL 2.13  0.86  5.78   Sodium 134 - 144 mmol/L 140  139  133   Potassium 3.5 - 5.2 mmol/L 4.4  4.5  4.0   Chloride 96 - 106 mmol/L 102  102  103   CO2 20 - 29 mmol/L 19  21  26    Calcium 8.7 - 10.3 mg/dL 46.9  62.9  9.7   Total Protein 6.0 - 8.5 g/dL 6.8  6.9    Total Bilirubin 0.0 - 1.2 mg/dL 0.3  0.4    Alkaline Phos 44 - 121 IU/L 102  68    AST 0 - 40 IU/L 14  20    ALT 0 - 32 IU/L 11  12      Latest Reference Range & Units 04/09/23 13:38  Iron 28 - 170 ug/dL 71  UIBC ug/dL 528  TIBC 413 - 244 ug/dL 010  Saturation Ratios 10.4 - 31.8 % 21  Ferritin 11 - 307 ng/mL 279    ASSESSMENT & PLAN:  A 73 y.o. female with a history of iron deficiency anemia.  When evaluating her labs today, I am very pleased with her hemoglobin  and iron levels.  From a hematologic standpoint, the patient is doing well.  As that is the case, I will see her back in 6 months for repeat clinical assessment.  The patient understands all the plans discussed today and is in agreement with them.  Sachin Ferencz Kirby Funk, MD

## 2023-04-09 ENCOUNTER — Other Ambulatory Visit: Payer: Self-pay

## 2023-04-09 ENCOUNTER — Other Ambulatory Visit: Payer: Self-pay | Admitting: Oncology

## 2023-04-09 ENCOUNTER — Inpatient Hospital Stay: Attending: Oncology

## 2023-04-09 ENCOUNTER — Inpatient Hospital Stay (HOSPITAL_BASED_OUTPATIENT_CLINIC_OR_DEPARTMENT_OTHER): Admitting: Oncology

## 2023-04-09 VITALS — BP 113/91 | HR 91 | Temp 98.2°F | Resp 14 | Ht <= 58 in | Wt 92.3 lb

## 2023-04-09 DIAGNOSIS — D509 Iron deficiency anemia, unspecified: Secondary | ICD-10-CM | POA: Diagnosis not present

## 2023-04-09 DIAGNOSIS — D508 Other iron deficiency anemias: Secondary | ICD-10-CM

## 2023-04-09 DIAGNOSIS — D500 Iron deficiency anemia secondary to blood loss (chronic): Secondary | ICD-10-CM

## 2023-04-09 DIAGNOSIS — Z79899 Other long term (current) drug therapy: Secondary | ICD-10-CM | POA: Diagnosis not present

## 2023-04-09 LAB — CBC WITH DIFFERENTIAL (CANCER CENTER ONLY)
Abs Immature Granulocytes: 0.03 10*3/uL (ref 0.00–0.07)
Basophils Absolute: 0.1 10*3/uL (ref 0.0–0.1)
Basophils Relative: 1 %
Eosinophils Absolute: 0.5 10*3/uL (ref 0.0–0.5)
Eosinophils Relative: 6 %
HCT: 39.3 % (ref 36.0–46.0)
Hemoglobin: 12.5 g/dL (ref 12.0–15.0)
Immature Granulocytes: 0 %
Lymphocytes Relative: 36 %
Lymphs Abs: 3 10*3/uL (ref 0.7–4.0)
MCH: 29 pg (ref 26.0–34.0)
MCHC: 31.8 g/dL (ref 30.0–36.0)
MCV: 91.2 fL (ref 80.0–100.0)
Monocytes Absolute: 0.7 10*3/uL (ref 0.1–1.0)
Monocytes Relative: 9 %
Neutro Abs: 4 10*3/uL (ref 1.7–7.7)
Neutrophils Relative %: 48 %
Platelet Count: 327 10*3/uL (ref 150–400)
RBC: 4.31 MIL/uL (ref 3.87–5.11)
RDW: 13.3 % (ref 11.5–15.5)
WBC Count: 8.3 10*3/uL (ref 4.0–10.5)
nRBC: 0 % (ref 0.0–0.2)
nRBC: 0 /100{WBCs}

## 2023-04-09 LAB — FERRITIN: Ferritin: 279 ng/mL (ref 11–307)

## 2023-04-09 LAB — IRON AND TIBC
Iron: 71 ug/dL (ref 28–170)
Saturation Ratios: 21 % (ref 10.4–31.8)
TIBC: 342 ug/dL (ref 250–450)
UIBC: 271 ug/dL

## 2023-04-10 ENCOUNTER — Encounter: Payer: Self-pay | Admitting: Oncology

## 2023-04-10 DIAGNOSIS — M25611 Stiffness of right shoulder, not elsewhere classified: Secondary | ICD-10-CM | POA: Diagnosis not present

## 2023-04-10 DIAGNOSIS — M6281 Muscle weakness (generalized): Secondary | ICD-10-CM | POA: Diagnosis not present

## 2023-04-10 DIAGNOSIS — M25511 Pain in right shoulder: Secondary | ICD-10-CM | POA: Diagnosis not present

## 2023-04-11 ENCOUNTER — Encounter: Payer: Self-pay | Admitting: Family Medicine

## 2023-04-12 ENCOUNTER — Other Ambulatory Visit: Payer: Self-pay | Admitting: Family Medicine

## 2023-04-12 ENCOUNTER — Other Ambulatory Visit: Payer: Self-pay

## 2023-04-12 DIAGNOSIS — Z79899 Other long term (current) drug therapy: Secondary | ICD-10-CM

## 2023-04-15 DIAGNOSIS — Z8711 Personal history of peptic ulcer disease: Secondary | ICD-10-CM | POA: Diagnosis not present

## 2023-04-15 DIAGNOSIS — R1310 Dysphagia, unspecified: Secondary | ICD-10-CM | POA: Diagnosis not present

## 2023-04-15 DIAGNOSIS — K219 Gastro-esophageal reflux disease without esophagitis: Secondary | ICD-10-CM | POA: Diagnosis not present

## 2023-04-15 DIAGNOSIS — K5900 Constipation, unspecified: Secondary | ICD-10-CM | POA: Diagnosis not present

## 2023-04-15 DIAGNOSIS — J449 Chronic obstructive pulmonary disease, unspecified: Secondary | ICD-10-CM | POA: Diagnosis not present

## 2023-04-15 DIAGNOSIS — R634 Abnormal weight loss: Secondary | ICD-10-CM | POA: Diagnosis not present

## 2023-04-15 DIAGNOSIS — R112 Nausea with vomiting, unspecified: Secondary | ICD-10-CM | POA: Diagnosis not present

## 2023-04-15 DIAGNOSIS — R109 Unspecified abdominal pain: Secondary | ICD-10-CM | POA: Diagnosis not present

## 2023-04-18 ENCOUNTER — Ambulatory Visit: Payer: Medicare PPO | Admitting: Oncology

## 2023-04-18 ENCOUNTER — Other Ambulatory Visit: Payer: Medicare PPO

## 2023-04-22 DIAGNOSIS — Z5189 Encounter for other specified aftercare: Secondary | ICD-10-CM | POA: Diagnosis not present

## 2023-04-22 DIAGNOSIS — Z96611 Presence of right artificial shoulder joint: Secondary | ICD-10-CM | POA: Diagnosis not present

## 2023-04-23 ENCOUNTER — Other Ambulatory Visit (HOSPITAL_COMMUNITY): Payer: Medicare PPO

## 2023-04-23 ENCOUNTER — Encounter (HOSPITAL_COMMUNITY): Payer: Medicare PPO

## 2023-04-23 ENCOUNTER — Other Ambulatory Visit: Payer: Self-pay | Admitting: Acute Care

## 2023-04-23 ENCOUNTER — Ambulatory Visit: Payer: Medicare PPO | Admitting: Vascular Surgery

## 2023-04-23 DIAGNOSIS — Z1231 Encounter for screening mammogram for malignant neoplasm of breast: Secondary | ICD-10-CM

## 2023-04-24 DIAGNOSIS — G5603 Carpal tunnel syndrome, bilateral upper limbs: Secondary | ICD-10-CM | POA: Diagnosis not present

## 2023-04-25 ENCOUNTER — Encounter: Payer: Self-pay | Admitting: Family Medicine

## 2023-04-27 ENCOUNTER — Other Ambulatory Visit: Payer: Self-pay | Admitting: Family Medicine

## 2023-04-29 ENCOUNTER — Telehealth: Payer: Self-pay

## 2023-04-29 NOTE — Progress Notes (Signed)
   04/29/2023  Patient ID: Allison Jimenez, female   DOB: 1950-03-04, 73 y.o.   MRN: [id number]  Contacted patient regarding referral for medication access and medication management from Mercy Stall, MD .   Left patient a voicemail to return my call at their convenience  Rolando Cliche, PharmD, [facility] Clinical Pharmacist  [phone]

## 2023-05-05 NOTE — Telephone Encounter (Signed)
 Patient currently on prolia . Dr. Reinhold Carbine

## 2023-05-06 DIAGNOSIS — M25511 Pain in right shoulder: Secondary | ICD-10-CM | POA: Diagnosis not present

## 2023-05-06 DIAGNOSIS — M6281 Muscle weakness (generalized): Secondary | ICD-10-CM | POA: Diagnosis not present

## 2023-05-06 DIAGNOSIS — M25611 Stiffness of right shoulder, not elsewhere classified: Secondary | ICD-10-CM | POA: Diagnosis not present

## 2023-05-07 ENCOUNTER — Other Ambulatory Visit: Payer: Self-pay | Admitting: Family Medicine

## 2023-05-08 ENCOUNTER — Telehealth: Payer: Self-pay

## 2023-05-08 NOTE — Progress Notes (Signed)
 Complex Care Management Care Guide Note  05/08/2023 Name: Allison Jimenez MRN: [id number] DOB: 1950-12-20  Allison Jimenez is a 73 y.o. year old female who is a primary care patient of Cox, Kirsten, MD and is actively engaged with the care management team. I reached out to Nathaniel Bald by phone today to assist with re-scheduling  with the Pharmacist.  Follow up plan: Per patient, she plans to outreach Rolando Cliche, [facility] via [place].  [facility]  [facility], [facility] Health Care Management Assistant Direct Dial: [phone]  Fax: [phone]

## 2023-05-13 ENCOUNTER — Other Ambulatory Visit: Payer: Self-pay | Admitting: Family Medicine

## 2023-05-13 NOTE — Progress Notes (Unsigned)
 [facility] [place]  ASSESSMENT / PLAN: Allison Jimenez is a 73 y.o. female with a 49mm infrarenal abdominal aortic aneurysm  The patient is not yet a candidate for repair based on size criteria. Her estimated annual rupture risk is 1-5%.   Recommend the following to reduce the risk of major adverse cardiac / limb events.  Complete cessation from all tobacco products. Blood glucose control with goal A1c < 7%. Blood pressure control with goal blood pressure < 140/90 mmHg. Lipid reduction therapy with goal LDL-C <100 mg/dL. Aspirin 81mg  PO QD.  Atorvastatin  40-80mg  PO QD (or other "high intensity" statin therapy).  Follow up with me in 3 months with AAA duplex.  CHIEF COMPLAINT: AAA growth  HISTORY OF PRESENT ILLNESS: Allison Jimenez is a 73 y.o. female who returns to clinic for discussion of abdominal aortic aneurysm.  This was noted to grow slightly from 3.8 to 4.1 cm on recent serial imaging.  The patient has no symptoms referable to her abdominal aortic aneurysm.  We spent the majority of our visit discussing the natural history abdominal aortic aneurysm disease, the rationale for the threshold of 5 cm for intervention in females, and the rationale for surveillance.  10/23/22: Patient returns to clinic for surveillance of carotid artery stenosis and abdominal aortic aneurysm.  She reports over the past 6 months she has been much less active.  She is frustrated by her inability to be independent and to get up and do things she previously enjoyed.  She is particularly bothered by her right shoulder which has very limited range of motion.  We reviewed her noninvasive test in detail.  05/14/23: Patient returns to clinic for surveillance of carotid artery disease and abdominal aortic aneurysm.  Patient is had multiple health challenges over the past several months.  She suffered a fall with multiple rib fractures.  She was admitted for urosepsis.  She has lost a significant  amount of weight.  We reviewed her duplex in detail.  I counseled her about her being very near the threshold for intervention in a woman.  Past Medical History:  Diagnosis Date   Acute blood loss anemia (ABLA) 09/05/2020   Allergy    ANXIETY 06/05/2006   Arthritis    SHOULDERS    BACK PAIN 01/05/2010   Carotid artery occlusion    Cataract    bilateral, left worse   Centrilobular emphysema (HCC)    COPD (chronic obstructive pulmonary disease) (HCC)    Depression    DIVERTICULOSIS, COLON 06/05/2006   DIZZINESS OR VERTIGO 06/05/2006   positional   GERD (gastroesophageal reflux disease)    Headache    Migraines   Hearing loss in left ear    no hearing aid   History of blood transfusion 08/2020   History of kidney stones    passed stones   HYPERLIPIDEMIA 06/05/2006   Hypertension    HYPOKALEMIA 12/14/2008   Hypothyroidism    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   Qualifier: Diagnosis of  By: Minnette Amato  MD, Ronie Cohen    Pneumonia    Pre-diabetes    TOBACCO USER 11/25/2007    Past Surgical History:  Procedure Laterality Date   APPENDECTOMY     bilateral cataract surgery      BRONCHIAL BIOPSY  09/28/2021   Procedure: BRONCHIAL BIOPSIES;  Surgeon: Gloriajean Large, MD;  Location: [facility] ENDOSCOPY;  Service: Pulmonary;;   BRONCHIAL NEEDLE ASPIRATION BIOPSY  09/28/2021   Procedure: BRONCHIAL NEEDLE ASPIRATION BIOPSIES;  Surgeon:  Gloriajean Large, MD;  Location: [facility] ENDOSCOPY;  Service: Pulmonary;;   BRONCHIAL WASHINGS  09/28/2021   Procedure: BRONCHIAL WASHINGS;  Surgeon: Gloriajean Large, MD;  Location: [facility] ENDOSCOPY;  Service: Pulmonary;;   CESAREAN SECTION     x1   CHOLECYSTECTOMY     COLONOSCOPY     COSMETIC SURGERY     CYSTOSCOPY W/ URETERAL STENT PLACEMENT Right 07/29/2022   Procedure: CYSTOSCOPY WITH RETROGRADE PYELOGRAM/URETERAL STENT PLACEMENT;  Surgeon: Marco Severs, MD;  Location: [facility] ORS;  Service: Urology;  Laterality: Right;   CYSTOSCOPY/URETEROSCOPY/HOLMIUM  LASER/STENT PLACEMENT Right 09/05/2022   Procedure: RIGHT URETEROSCOPY/HOLMIUM LASER/STENT PLACEMENT;  Surgeon: Mallie Seal, MD;  Location: [facility] ORS;  Service: Urology;  Laterality: Right;   mastoid surgery  1992   shunt in mastoid- endolymphatic sac in left ear    MRI  07/11/2020   x several, last one located in CE   REVERSE SHOULDER ARTHROPLASTY Right 01/24/2023   Procedure: REVERSE SHOULDER ARTHROPLASTY;  Surgeon: Ellard Gunning, MD;  Location: [facility] ORS;  Service: Orthopedics;  Laterality: Right;  120 min   TUBAL LIGATION     UPPER GI ENDOSCOPY     vocal cord surgery     nodule x2    Family History  Problem Relation Age of Onset   Breast cancer Mother    Dementia Mother    Heart disease Mother    Hypertension Mother    Thyroid  disease Mother    Alzheimer's disease Father    Heart disease Father    Hypertension Father    Thyroid  disease Father    Diabetes Sister    Colon cancer Neg Hx    Rectal cancer Neg Hx    Stomach cancer Neg Hx    Colon polyps Neg Hx    Esophageal cancer Neg Hx     Social History   Socioeconomic History   Marital status: Divorced    Spouse name: Not on file   Number of children: 1   Years of education: 13   Highest education level: Not on file  Occupational History   Occupation: ASSISTANT CREDIT MANAGER    Employer: [employer]    Comment: Retired  Tobacco Use   Smoking status: Every Day    Current packs/day: 0.50    Average packs/day: 0.5 packs/day for 49.0 years (24.5 ttl pk-yrs)    Types: Cigarettes   Smokeless tobacco: Never   Tobacco comments:    3/4 ppd 08/30/21 [name], CMA  Vaping Use   Vaping status: Former   Start date: 01/01/2010   Quit date: 08/01/2021   Substances: Nicotine    Devices: menthol/fruit flavor  Substance and Sexual Activity   Alcohol  use: No    Alcohol /week: 0.0 standard drinks of alcohol    Drug use: No   Sexual activity: Not Currently    Birth control/protection: Post-menopausal  Other  Topics Concern   Not on file  Social History Narrative   Patient lives at home alone. Patient has a Biochemist, clinical. Patient is divorced .   Patient is retired.   Education high school.   Right handed.   Caffeine none.   Social Drivers of Corporate investment banker Strain: Low Risk  (10/31/2022)   Overall Financial Resource Strain (CARDIA)    Difficulty of Paying Living Expenses: Not hard at all  Food Insecurity: No Food Insecurity (09/27/2022)   Hunger Vital Sign    Worried About Running Out of Food in the Last Year: Never true  Ran Out of Food in the Last Year: Never true  Transportation Needs: No Transportation Needs (09/27/2022)   PRAPARE - Administrator, Civil Service (Medical): No    Lack of Transportation (Non-Medical): No  Physical Activity: Inactive (10/31/2022)   Exercise Vital Sign    Days of Exercise per Week: 0 days    Minutes of Exercise per Session: 0 min  Stress: No Stress Concern Present (10/31/2022)   [employer] of Occupational Health - Occupational Stress Questionnaire    Feeling of Stress : Not at all  Social Connections: Socially Isolated (10/31/2022)   Social Connection and Isolation Panel [NHANES]    Frequency of Communication with Friends and Family: Three times a week    Frequency of Social Gatherings with Friends and Family: Three times a week    Attends Religious Services: Never    Active Member of Clubs or Organizations: No    Attends Banker Meetings: Never    Marital Status: Divorced  Catering manager Violence: Not At Risk (07/29/2022)   Humiliation, Afraid, Rape, and Kick questionnaire    Fear of Current or Ex-Partner: No    Emotionally Abused: No    Physically Abused: No    Sexually Abused: No    Allergies  Allergen Reactions   Erythromycin Base Diarrhea   Vfend  [Voriconazole ] Other (See Comments)    Visual changes   Penicillins Rash    Current Outpatient Medications  Medication Sig Dispense  Refill   acetaminophen  (TYLENOL ) 500 MG tablet Take 500-1,000 mg by mouth every 6 (six) hours as needed for mild pain, moderate pain or headache.     albuterol  (VENTOLIN  HFA) 108 (90 Base) MCG/ACT inhaler Inhale 2 puffs into the lungs every 6 (six) hours as needed for shortness of breath or wheezing. 8 g 2   atorvastatin  (LIPITOR) 20 MG tablet TAKE 1 TABLET(20 MG) BY MOUTH DAILY 90 tablet 1   calcitonin, salmon, (MIACALCIN /FORTICAL) 200 UNIT/ACT nasal spray Place 1 spray into alternate nostrils daily.     Carboxymethylcellul-Glycerin (LUBRICATING EYE DROPS OP) Place 1 drop into both eyes daily.     carvedilol  (COREG ) 25 MG tablet TAKE 1 TABLET(25 MG) BY MOUTH TWICE DAILY WITH A MEAL 180 tablet 0   cetirizine (ZYRTEC) 10 MG tablet Take 10 mg by mouth daily as needed for allergies.     chlorthalidone  (HYGROTON ) 25 MG tablet TAKE 1 TABLET(25 MG) BY MOUTH DAILY 90 tablet 1   Cholecalciferol (VITAMIN D ) 50 MCG (2000 UT) CAPS Take 2,000 Units by mouth daily.     conjugated estrogens  (PREMARIN ) vaginal cream Place 1 applicator vaginally daily as needed (dryness / burning).     cyclobenzaprine  (FLEXERIL ) 10 MG tablet Take 1 tablet (10 mg total) by mouth 3 (three) times daily as needed for muscle spasms. 30 tablet 1   diclofenac  (FLECTOR ) 1.3 % PTCH Place 1 patch onto the skin 2 (two) times daily. 60 patch 5   diclofenac  Sodium (VOLTAREN ) 1 % GEL Apply 1 Application topically 4 (four) times daily as needed (pain).     famotidine  (PEPCID ) 40 MG tablet TAKE 1 TABLET(40 MG) BY MOUTH AT BEDTIME 90 tablet 1   fluorometholone (FML) 0.1 % ophthalmic suspension INSTILL 1 DROP INTO LEFT EYE THREE TIMES DAILY FOR 3 DAYS THEN TWICE DAILY FOR 3 DAYS THEN EVERY DAY FOR 3 DAYS THEN STOP     FLUoxetine  (PROZAC ) 20 MG capsule Take 3 capsules (60 mg total) by mouth daily. 270 capsule 0  fluticasone  (FLONASE ) 50 MCG/ACT nasal spray SHAKE LIQUID AND USE 2 SPRAYS IN EACH NOSTRIL DAILY (Patient taking differently: Place 2  sprays into both nostrils daily as needed for allergies.) 16 g 6   Fluticasone -Umeclidin-Vilant (TRELEGY ELLIPTA ) 100-62.5-25 MCG/ACT AEPB INHALE 1 PUFF INTO THE LUNGS DAILY 180 each 3   levothyroxine  (SYNTHROID ) 25 MCG tablet TAKE 1 TABLET(25 MCG) BY MOUTH DAILY 90 tablet 0   montelukast  (SINGULAIR ) 10 MG tablet Take 1 tablet (10 mg total) by mouth at bedtime. 90 tablet 3   ondansetron  (ZOFRAN ) 4 MG tablet Take 1 tablet (4 mg total) by mouth every 8 (eight) hours as needed for nausea or vomiting. 10 tablet 0   OVER THE COUNTER MEDICATION Take 1 tablet by mouth in the morning. Salt Supplement     oxyCODONE -acetaminophen  (PERCOCET) 10-325 MG tablet Take 1 tablet by mouth every 8 (eight) hours as needed for pain.     pantoprazole  (PROTONIX ) 40 MG tablet TAKE 1 TABLET(40 MG) BY MOUTH DAILY 90 tablet 0   polyethylene glycol (MIRALAX  / GLYCOLAX ) 17 g packet Take 17 g by mouth daily.     potassium chloride  (MICRO-K ) 10 MEQ CR capsule Take 2 capsules (20 mEq total) by mouth 2 (two) times daily. 360 capsule 1   pregabalin  (LYRICA ) 25 MG capsule Take 1 capsule (25 mg total) by mouth at bedtime. 90 capsule 0   promethazine  (PHENERGAN ) 25 MG tablet TAKE 1 TABLET BY MOUTH EVERY 8 HOURS IF NEEDED 20 tablet 0   rizatriptan  (MAXALT ) 10 MG tablet Take 10 mg by mouth as needed for migraine.     vitamin B-12 (CYANOCOBALAMIN ) 500 MCG tablet Take 500 mcg by mouth daily at 12 noon.     No current facility-administered medications for this visit.    PHYSICAL EXAM There were no vitals filed for this visit.   Elderly woman in no acute distress Regular rate and rhythm Unlabored breathing Soft, nontender abdomen 2+ posterior tibial pulses bilaterally   PERTINENT LABORATORY AND RADIOLOGIC DATA  Most recent CBC    Latest Ref Rng & Units 04/09/2023    1:38 PM 03/27/2023   11:42 AM 01/14/2023    1:54 PM  CBC  WBC 4.0 - 10.5 K/uL 8.3  9.1  8.4   Hemoglobin 12.0 - 15.0 g/dL 16.1  09.6  04.5   Hematocrit 36.0 -  46.0 % 39.3  35.5  38.4   Platelets 150 - 400 K/uL 327  335  342      Most recent CMP    Latest Ref Rng & Units 03/27/2023   11:42 AM 01/16/2023    4:08 PM 01/14/2023    1:54 PM  CMP  Glucose 70 - 99 mg/dL 84  409  811   BUN 8 - 27 mg/dL 13  13  14    Creatinine 0.57 - 1.00 mg/dL 9.14  7.82  9.56   Sodium 134 - 144 mmol/L 140  139  133   Potassium 3.5 - 5.2 mmol/L 4.4  4.5  4.0   Chloride 96 - 106 mmol/L 102  102  103   CO2 20 - 29 mmol/L 19  21  26    Calcium  8.7 - 10.3 mg/dL 21.3  08.6  9.7   Total Protein 6.0 - 8.5 g/dL 6.8  6.9    Total Bilirubin 0.0 - 1.2 mg/dL 0.3  0.4    Alkaline Phos 44 - 121 IU/L 102  68    AST 0 - 40 IU/L 14  20  ALT 0 - 32 IU/L 11  12      Renal function CrCl cannot be calculated (Patient's most recent lab result is older than the maximum 21 days allowed.).  Hgb A1c MFr Bld (%)  Date Value  03/27/2023 5.1    LDL Chol Calc (NIH)  Date Value Ref Range Status  03/27/2023 44 0 - 99 mg/dL Final   Direct LDL  Date Value Ref Range Status  09/13/2011 151.9 mg/dL Final    Comment:    Optimal:  <100 mg/dLNear or Above Optimal:  100-129 mg/dLBorderline High:  130-159 mg/dLHigh:  160-189 mg/dLVery High:  >190 mg/dL    Abdominal aortic aneurysm duplex shows interval growth of infrarenal abdominal aortic aneurysm to 49.5 mm  Right Carotid: Velocities in the right ICA are consistent with a 40-59%                 stenosis.   Left Carotid: Velocities in the left ICA are consistent with a 1-39%  stenosis.   Vertebrals: Bilateral vertebral arteries demonstrate antegrade flow.  Subclavians: Normal flow hemodynamics were seen in bilateral subclavian               arteries.   Heber Little. Edgardo Goodwill, MD FACS [facility] Phone Number: [phone] 05/13/2023 8:43 PM   Total time spent on preparing this encounter including chart review, data review, collecting history, examining the patient, coordinating care for this  established patient, 30 minutes.  Portions of this report may have been transcribed using voice recognition software.  Every effort has been made to ensure accuracy; however, inadvertent computerized transcription errors may still be present.

## 2023-05-14 ENCOUNTER — Encounter: Payer: Self-pay | Admitting: Vascular Surgery

## 2023-05-14 ENCOUNTER — Ambulatory Visit: Admitting: Vascular Surgery

## 2023-05-14 ENCOUNTER — Ambulatory Visit (HOSPITAL_COMMUNITY)
Admission: RE | Admit: 2023-05-14 | Discharge: 2023-05-14 | Disposition: A | Discharge status: Home / Self Care | Source: Ambulatory Visit | Attending: Vascular Surgery | Admitting: Vascular Surgery

## 2023-05-14 ENCOUNTER — Ambulatory Visit (HOSPITAL_BASED_OUTPATIENT_CLINIC_OR_DEPARTMENT_OTHER)
Admission: RE | Admit: 2023-05-14 | Discharge: 2023-05-14 | Disposition: A | Discharge status: Home / Self Care | Source: Ambulatory Visit | Attending: Vascular Surgery | Admitting: Vascular Surgery

## 2023-05-14 VITALS — BP 152/74 | HR 57 | Temp 97.7°F | Ht <= 58 in | Wt 92.3 lb

## 2023-05-14 DIAGNOSIS — I6523 Occlusion and stenosis of bilateral carotid arteries: Secondary | ICD-10-CM

## 2023-05-14 DIAGNOSIS — I7143 Infrarenal abdominal aortic aneurysm, without rupture: Secondary | ICD-10-CM | POA: Diagnosis not present

## 2023-05-17 ENCOUNTER — Other Ambulatory Visit: Payer: Self-pay | Admitting: *Deleted

## 2023-05-17 DIAGNOSIS — I7143 Infrarenal abdominal aortic aneurysm, without rupture: Secondary | ICD-10-CM

## 2023-05-22 ENCOUNTER — Other Ambulatory Visit: Payer: Self-pay

## 2023-05-22 DIAGNOSIS — F411 Generalized anxiety disorder: Secondary | ICD-10-CM

## 2023-05-22 DIAGNOSIS — J0181 Other acute recurrent sinusitis: Secondary | ICD-10-CM

## 2023-05-22 MED ORDER — FLUTICASONE PROPIONATE 50 MCG/ACT NA SUSP: 2.0000 | Freq: Every day | NASAL | 6 refills | Status: AC

## 2023-05-23 DIAGNOSIS — G894 Chronic pain syndrome: Secondary | ICD-10-CM | POA: Diagnosis not present

## 2023-05-23 DIAGNOSIS — M546 Pain in thoracic spine: Secondary | ICD-10-CM | POA: Diagnosis not present

## 2023-05-23 DIAGNOSIS — M545 Low back pain, unspecified: Secondary | ICD-10-CM | POA: Diagnosis not present

## 2023-05-23 DIAGNOSIS — M791 Myalgia, unspecified site: Secondary | ICD-10-CM | POA: Diagnosis not present

## 2023-05-25 ENCOUNTER — Other Ambulatory Visit: Payer: Self-pay | Admitting: Family Medicine

## 2023-05-25 DIAGNOSIS — E039 Hypothyroidism, unspecified: Secondary | ICD-10-CM

## 2023-05-28 ENCOUNTER — Other Ambulatory Visit: Payer: Self-pay | Admitting: Family Medicine

## 2023-05-28 ENCOUNTER — Ambulatory Visit
Admission: RE | Admit: 2023-05-28 | Discharge: 2023-05-28 | Disposition: A | Discharge status: Home / Self Care | Source: Ambulatory Visit | Attending: Acute Care | Admitting: Acute Care

## 2023-05-28 DIAGNOSIS — Z1231 Encounter for screening mammogram for malignant neoplasm of breast: Secondary | ICD-10-CM

## 2023-05-30 ENCOUNTER — Ambulatory Visit: Admitting: Family Medicine

## 2023-05-30 ENCOUNTER — Encounter: Payer: Self-pay | Admitting: Family Medicine

## 2023-05-30 VITALS — BP 136/80 | HR 62 | Temp 98.0°F | Ht <= 58 in | Wt 91.2 lb

## 2023-05-30 DIAGNOSIS — L219 Seborrheic dermatitis, unspecified: Secondary | ICD-10-CM | POA: Diagnosis not present

## 2023-05-30 DIAGNOSIS — J029 Acute pharyngitis, unspecified: Secondary | ICD-10-CM | POA: Diagnosis not present

## 2023-05-30 DIAGNOSIS — H6123 Impacted cerumen, bilateral: Secondary | ICD-10-CM | POA: Diagnosis not present

## 2023-05-30 LAB — POCT RAPID STREP A (OFFICE): Rapid Strep A Screen: NEGATIVE

## 2023-05-30 MED ORDER — CEFDINIR 300 MG PO CAPS: 300.0000 mg | ORAL_CAPSULE | Freq: Two times a day (BID) | ORAL | 0 refills | Status: DC

## 2023-05-30 MED ORDER — KETOCONAZOLE 2 % EX SHAM: 1.0000 | MEDICATED_SHAMPOO | CUTANEOUS | 0 refills | Status: AC

## 2023-05-30 NOTE — Progress Notes (Signed)
 Acute Office Visit  Subjective:    Patient ID: Allison Jimenez, female    DOB: [birth year], 73 y.o.   MRN: [id number]  Chief Complaint  Patient presents with   Cough    HPI: Patient is in today for sore throat, red and scratchy x 5 days ago. States she has white spots in her mouth. Only taking allergy medication.  Past Medical History:  Diagnosis Date   Acute blood loss anemia (ABLA) 09/05/2020   Allergy    ANXIETY 06/05/2006   Arthritis    SHOULDERS    BACK PAIN 01/05/2010   Carotid artery occlusion    Cataract    bilateral, left worse   Centrilobular emphysema (HCC)    COPD (chronic obstructive pulmonary disease) (HCC)    Depression    DIVERTICULOSIS, COLON 06/05/2006   DIZZINESS OR VERTIGO 06/05/2006   positional   GERD (gastroesophageal reflux disease)    Headache    Migraines   Hearing loss in left ear    no hearing aid   History of blood transfusion 08/2020   History of kidney stones    passed stones   HYPERLIPIDEMIA 06/05/2006   Hypertension    HYPOKALEMIA 12/14/2008   Hypothyroidism    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   Qualifier: Diagnosis of  By: Minnette Amato  MD, Ronie Cohen    Pneumonia    Pre-diabetes    TOBACCO USER 11/25/2007    Past Surgical History:  Procedure Laterality Date   APPENDECTOMY     bilateral cataract surgery      BRONCHIAL BIOPSY  09/28/2021   Procedure: BRONCHIAL BIOPSIES;  Surgeon: Gloriajean Large, MD;  Location: [facility] ENDOSCOPY;  Service: Pulmonary;;   BRONCHIAL NEEDLE ASPIRATION BIOPSY  09/28/2021   Procedure: BRONCHIAL NEEDLE ASPIRATION BIOPSIES;  Surgeon: Gloriajean Large, MD;  Location: [facility] ENDOSCOPY;  Service: Pulmonary;;   BRONCHIAL WASHINGS  09/28/2021   Procedure: BRONCHIAL WASHINGS;  Surgeon: Gloriajean Large, MD;  Location: [facility] ENDOSCOPY;  Service: Pulmonary;;   CESAREAN SECTION     x1   CHOLECYSTECTOMY     COLONOSCOPY     COSMETIC SURGERY     CYSTOSCOPY W/ URETERAL STENT PLACEMENT Right 07/29/2022   Procedure:  CYSTOSCOPY WITH RETROGRADE PYELOGRAM/URETERAL STENT PLACEMENT;  Surgeon: Marco Severs, MD;  Location: [facility] ORS;  Service: Urology;  Laterality: Right;   CYSTOSCOPY/URETEROSCOPY/HOLMIUM LASER/STENT PLACEMENT Right 09/05/2022   Procedure: RIGHT URETEROSCOPY/HOLMIUM LASER/STENT PLACEMENT;  Surgeon: Mallie Seal, MD;  Location: [facility] ORS;  Service: Urology;  Laterality: Right;   mastoid surgery  1992   shunt in mastoid- endolymphatic sac in left ear    MRI  07/11/2020   x several, last one located in CE   REVERSE SHOULDER ARTHROPLASTY Right 01/24/2023   Procedure: REVERSE SHOULDER ARTHROPLASTY;  Surgeon: Ellard Gunning, MD;  Location: [facility] ORS;  Service: Orthopedics;  Laterality: Right;  120 min   TUBAL LIGATION     UPPER GI ENDOSCOPY     vocal cord surgery     nodule x2    Family History  Problem Relation Age of Onset   Breast cancer Mother    Dementia Mother    Heart disease Mother    Hypertension Mother    Thyroid  disease Mother    Alzheimer's disease Father    Heart disease Father    Hypertension Father    Thyroid  disease Father    Diabetes Sister    Colon cancer Neg Hx    Rectal cancer Neg Hx  Stomach cancer Neg Hx    Colon polyps Neg Hx    Esophageal cancer Neg Hx     Social History   Socioeconomic History   Marital status: Divorced    Spouse name: Not on file   Number of children: 1   Years of education: 13   Highest education level: Not on file  Occupational History   Occupation: ASSISTANT CREDIT MANAGER    Employer: [employer]    Comment: Retired  Tobacco Use   Smoking status: Every Day    Current packs/day: 0.50    Average packs/day: 0.5 packs/day for 49.0 years (24.5 ttl pk-yrs)    Types: Cigarettes   Smokeless tobacco: Never   Tobacco comments:    3/4 ppd 08/30/21 [name], CMA  Vaping Use   Vaping status: Former   Start date: 01/01/2010   Quit date: 08/01/2021   Substances: Nicotine    Devices: menthol/fruit flavor  Substance and  Sexual Activity   Alcohol  use: No    Alcohol /week: 0.0 standard drinks of alcohol    Drug use: No   Sexual activity: Not Currently    Birth control/protection: Post-menopausal  Other Topics Concern   Not on file  Social History Narrative   Patient lives at home alone. Patient has a Biochemist, clinical. Patient is divorced .   Patient is retired.   Education high school.   Right handed.   Caffeine none.   Social Drivers of Corporate investment banker Strain: Low Risk  (05/23/2023)   Received from [facility]   Overall Financial Resource Strain (CARDIA)    Difficulty of Paying Living Expenses: Not hard at all  Food Insecurity: No Food Insecurity (05/23/2023)   Received from [facility]   Hunger Vital Sign    Worried About Running Out of Food in the Last Year: Never true    Ran Out of Food in the Last Year: Never true  Transportation Needs: No Transportation Needs (05/23/2023)   Received from [facility] - Transportation    Lack of Transportation (Medical): No    Lack of Transportation (Non-Medical): No  Physical Activity: Inactive (10/31/2022)   Exercise Vital Sign    Days of Exercise per Week: 0 days    Minutes of Exercise per Session: 0 min  Stress: No Stress Concern Present (10/31/2022)   [employer] of Occupational Health - Occupational Stress Questionnaire    Feeling of Stress : Not at all  Social Connections: Socially Isolated (10/31/2022)   Social Connection and Isolation Panel [NHANES]    Frequency of Communication with Friends and Family: Three times a week    Frequency of Social Gatherings with Friends and Family: Three times a week    Attends Religious Services: Never    Active Member of Clubs or Organizations: No    Attends Banker Meetings: Never    Marital Status: Divorced  Catering manager Violence: Not At Risk (07/29/2022)   Humiliation, Afraid, Rape, and Kick questionnaire    Fear of Current or Ex-Partner: No     Emotionally Abused: No    Physically Abused: No    Sexually Abused: No    Outpatient Medications Prior to Visit  Medication Sig Dispense Refill   acetaminophen  (TYLENOL ) 500 MG tablet Take 500-1,000 mg by mouth every 6 (six) hours as needed for mild pain, moderate pain or headache.     albuterol  (VENTOLIN  HFA) 108 (90 Base) MCG/ACT inhaler Inhale 2 puffs into the lungs every 6 (  six) hours as needed for shortness of breath or wheezing. 8 g 2   atorvastatin  (LIPITOR) 20 MG tablet TAKE 1 TABLET(20 MG) BY MOUTH DAILY 90 tablet 1   calcitonin, salmon, (MIACALCIN /FORTICAL) 200 UNIT/ACT nasal spray Place 1 spray into alternate nostrils daily.     Carboxymethylcellul-Glycerin (LUBRICATING EYE DROPS OP) Place 1 drop into both eyes daily.     carvedilol  (COREG ) 25 MG tablet TAKE 1 TABLET(25 MG) BY MOUTH TWICE DAILY WITH A MEAL 180 tablet 0   cetirizine (ZYRTEC) 10 MG tablet Take 10 mg by mouth daily as needed for allergies.     chlorthalidone  (HYGROTON ) 25 MG tablet TAKE 1 TABLET(25 MG) BY MOUTH DAILY 90 tablet 1   Cholecalciferol (VITAMIN D ) 50 MCG (2000 UT) CAPS Take 2,000 Units by mouth daily.     conjugated estrogens  (PREMARIN ) vaginal cream Place 1 applicator vaginally daily as needed (dryness / burning).     cyclobenzaprine  (FLEXERIL ) 10 MG tablet Take 1 tablet (10 mg total) by mouth 3 (three) times daily as needed for muscle spasms. 30 tablet 1   diclofenac  Sodium (VOLTAREN ) 1 % GEL Apply 1 Application topically 4 (four) times daily as needed (pain).     famotidine  (PEPCID ) 40 MG tablet TAKE 1 TABLET(40 MG) BY MOUTH AT BEDTIME 90 tablet 1   fluorometholone (FML) 0.1 % ophthalmic suspension INSTILL 1 DROP INTO LEFT EYE THREE TIMES DAILY FOR 3 DAYS THEN TWICE DAILY FOR 3 DAYS THEN EVERY DAY FOR 3 DAYS THEN STOP     FLUoxetine  (PROZAC ) 20 MG capsule Take 3 capsules (60 mg total) by mouth daily. 270 capsule 0   fluticasone  (FLONASE ) 50 MCG/ACT nasal spray Place 2 sprays into both nostrils daily. SHAKE  LIQUID AND USE 2 SPRAYS IN EACH NOSTRIL DAILY 16 g 6   Fluticasone -Umeclidin-Vilant (TRELEGY ELLIPTA ) 100-62.5-25 MCG/ACT AEPB INHALE 1 PUFF INTO THE LUNGS DAILY 180 each 3   montelukast  (SINGULAIR ) 10 MG tablet Take 1 tablet (10 mg total) by mouth at bedtime. 90 tablet 3   ondansetron  (ZOFRAN ) 4 MG tablet Take 1 tablet (4 mg total) by mouth every 8 (eight) hours as needed for nausea or vomiting. 10 tablet 0   OVER THE COUNTER MEDICATION Take 1 tablet by mouth in the morning. Salt Supplement     oxyCODONE -acetaminophen  (PERCOCET) 10-325 MG tablet Take 1 tablet by mouth every 8 (eight) hours as needed for pain.     pantoprazole  (PROTONIX ) 40 MG tablet TAKE 1 TABLET(40 MG) BY MOUTH DAILY 90 tablet 0   polyethylene glycol (MIRALAX  / GLYCOLAX ) 17 g packet Take 17 g by mouth daily.     potassium chloride  (MICRO-K ) 10 MEQ CR capsule TAKE 2 CAPSULES(20 MEQ) BY MOUTH TWICE DAILY 360 capsule 1   pregabalin  (LYRICA ) 25 MG capsule Take 1 capsule (25 mg total) by mouth at bedtime. 90 capsule 0   promethazine  (PHENERGAN ) 25 MG tablet TAKE 1 TABLET BY MOUTH EVERY 8 HOURS IF NEEDED 20 tablet 0   rizatriptan  (MAXALT ) 10 MG tablet Take 10 mg by mouth as needed for migraine.     vitamin B-12 (CYANOCOBALAMIN ) 500 MCG tablet Take 500 mcg by mouth daily at 12 noon.     diclofenac  (FLECTOR ) 1.3 % PTCH Place 1 patch onto the skin 2 (two) times daily. 60 patch 5   levothyroxine  (SYNTHROID ) 25 MCG tablet TAKE 1 TABLET(25 MCG) BY MOUTH DAILY 90 tablet 0   No facility-administered medications prior to visit.    Allergies  Allergen Reactions  Erythromycin Base Diarrhea   Vfend  [Voriconazole ] Other (See Comments)    Visual changes   Penicillins Rash    Review of Systems  Constitutional:  Negative for chills, fatigue and fever.  HENT:  Positive for sore throat and voice change. Negative for congestion, ear pain, postnasal drip, rhinorrhea, sinus pressure and sinus pain.   Respiratory:  Positive for cough. Negative  for shortness of breath.   Cardiovascular:  Negative for chest pain.  Gastrointestinal:  Negative for diarrhea, nausea and vomiting.  Neurological:  Negative for dizziness and headaches.       Objective:         05/30/2023   10:11 AM 05/14/2023   11:09 AM 05/14/2023   11:07 AM  Vitals with BMI  Height 4\' 10"   4\' 10"   Weight 91 lbs 3 oz  92 lbs 5 oz  BMI 19.07  19.3  Systolic 136 152 161  Diastolic 80 74 69  Pulse 62  57    No data found.   Physical Exam Vitals reviewed.  Constitutional:      Appearance: Normal appearance.  HENT:     Right Ear: There is impacted cerumen.     Left Ear: There is impacted cerumen.     Nose: Rhinorrhea present.     Mouth/Throat:     Pharynx: Posterior oropharyngeal erythema present. No oropharyngeal exudate.  Cardiovascular:     Rate and Rhythm: Normal rate and regular rhythm.     Heart sounds: Normal heart sounds. No murmur heard. Pulmonary:     Effort: Pulmonary effort is normal. No respiratory distress.     Breath sounds: Normal breath sounds.  Lymphadenopathy:     Cervical: No cervical adenopathy.  Skin:    Comments: Dry scaling patches on scalp.   Neurological:     Mental Status: She is alert and oriented to person, place, and time.  Psychiatric:        Mood and Affect: Mood normal.        Behavior: Behavior normal.     Health Maintenance Due  Topic Date Due   Zoster Vaccines- Shingrix (2 of 2) 06/22/2016   DTaP/Tdap/Td (2 - Td or Tdap) 05/17/2020   Diabetic kidney evaluation - Urine ACR  01/26/2023   COVID-19 Vaccine (7 - Pfizer risk 2024-25 season) 03/17/2023    There are no preventive care reminders to display for this patient.   Lab Results  Component Value Date   TSH 2.640 09/17/2022   Lab Results  Component Value Date   WBC 8.3 04/09/2023   HGB 12.5 04/09/2023   HCT 39.3 04/09/2023   MCV 91.2 04/09/2023   PLT 327 04/09/2023   Lab Results  Component Value Date   NA 140 03/27/2023   K 4.4 03/27/2023    CO2 19 (L) 03/27/2023   GLUCOSE 84 03/27/2023   BUN 13 03/27/2023   CREATININE 0.60 03/27/2023   BILITOT 0.3 03/27/2023   ALKPHOS 102 03/27/2023   AST 14 03/27/2023   ALT 11 03/27/2023   PROT 6.8 03/27/2023   ALBUMIN 4.3 03/27/2023   CALCIUM  10.0 03/27/2023   ANIONGAP 4 (L) 01/14/2023   EGFR 96 03/27/2023   GFR 75.48 07/09/2016   Lab Results  Component Value Date   CHOL 104 03/27/2023   Lab Results  Component Value Date   HDL 41 03/27/2023   Lab Results  Component Value Date   LDLCALC 44 03/27/2023   Lab Results  Component Value Date   TRIG 102 03/27/2023  Lab Results  Component Value Date   CHOLHDL 2.5 03/27/2023   Lab Results  Component Value Date   HGBA1C 5.1 03/27/2023       Assessment & Plan:  Acute pharyngitis, unspecified etiology Assessment & Plan: Cefdinir  300 mg twice daily x 10 days for throat infection.  Recommend gargle warm salt water .    Orders: -     POCT rapid strep A  Seborrheic dermatitis Assessment & Plan: Recommend ketoconazole  shampoo use twice a week for 4 weeks.    Bilateral impacted cerumen Assessment & Plan: Recommend debrox.    Other orders -     Ketoconazole ; Apply 1 Application topically 2 (two) times a week.  Dispense: 120 mL; Refill: 0 -     Cefdinir ; Take 1 capsule (300 mg total) by mouth 2 (two) times daily.  Dispense: 20 capsule; Refill: 0     Meds ordered this encounter  Medications   ketoconazole  (NIZORAL ) 2 % shampoo    Sig: Apply 1 Application topically 2 (two) times a week.    Dispense:  120 mL    Refill:  0   cefdinir  (OMNICEF ) 300 MG capsule    Sig: Take 1 capsule (300 mg total) by mouth 2 (two) times daily.    Dispense:  20 capsule    Refill:  0    Orders Placed This Encounter  Procedures   POCT rapid strep A     Follow-up: Return if symptoms worsen or fail to improve.  An After Visit Summary was printed and given to the patient.  Mercy Stall, MD [facility] [phone]

## 2023-05-30 NOTE — Patient Instructions (Signed)
 Cefdinir  300 mg twice daily x 10 days for throat infection.  Recommend gargle warm salt water .  Recommend ketoconazole  shampoo use twice a week for 4 weeks.

## 2023-05-31 ENCOUNTER — Other Ambulatory Visit: Payer: Self-pay | Admitting: Family Medicine

## 2023-05-31 DIAGNOSIS — E039 Hypothyroidism, unspecified: Secondary | ICD-10-CM

## 2023-06-01 DIAGNOSIS — H6123 Impacted cerumen, bilateral: Secondary | ICD-10-CM | POA: Insufficient documentation

## 2023-06-01 DIAGNOSIS — L219 Seborrheic dermatitis, unspecified: Secondary | ICD-10-CM | POA: Insufficient documentation

## 2023-06-01 DIAGNOSIS — J060 Acute laryngopharyngitis: Secondary | ICD-10-CM | POA: Insufficient documentation

## 2023-06-01 DIAGNOSIS — J029 Acute pharyngitis, unspecified: Secondary | ICD-10-CM | POA: Insufficient documentation

## 2023-06-01 NOTE — Assessment & Plan Note (Signed)
Recommend debrox

## 2023-06-01 NOTE — Assessment & Plan Note (Signed)
 Recommend ketoconazole  shampoo use twice a week for 4 weeks.

## 2023-06-01 NOTE — Assessment & Plan Note (Signed)
 Cefdinir  300 mg twice daily x 10 days for throat infection.  Recommend gargle warm salt water .

## 2023-06-03 ENCOUNTER — Other Ambulatory Visit: Payer: Self-pay

## 2023-06-03 DIAGNOSIS — M8000XD Age-related osteoporosis with current pathological fracture, unspecified site, subsequent encounter for fracture with routine healing: Secondary | ICD-10-CM

## 2023-06-03 MED ORDER — DENOSUMAB 60 MG/ML ~~LOC~~ SOSY: 60.0000 mg | PREFILLED_SYRINGE | Freq: Once | SUBCUTANEOUS | 0 refills | Status: AC

## 2023-06-03 NOTE — Progress Notes (Signed)
   06/03/2023  Patient ID: Allison Jimenez, female   DOB: 04-03-1950, 73 y.o.   MRN: [id number]  Performed test claim on prolia  Rx. Kim LPN states pt is getting LIS, appears pt responsibility would be ~$12/180 days supply    Rolando Cliche, PharmD, BCGP Clinical Pharmacist  [phone]

## 2023-06-06 ENCOUNTER — Encounter: Payer: Self-pay | Admitting: Family Medicine

## 2023-06-09 ENCOUNTER — Encounter: Payer: Self-pay | Admitting: Family Medicine

## 2023-06-11 NOTE — Telephone Encounter (Signed)
 Left message for patient to return call.

## 2023-06-13 ENCOUNTER — Ambulatory Visit (INDEPENDENT_AMBULATORY_CARE_PROVIDER_SITE_OTHER): Admitting: Family Medicine

## 2023-06-13 ENCOUNTER — Other Ambulatory Visit: Payer: Self-pay | Admitting: Family Medicine

## 2023-06-13 ENCOUNTER — Encounter: Payer: Self-pay | Admitting: Family Medicine

## 2023-06-13 VITALS — BP 122/64 | HR 83 | Temp 98.0°F | Ht <= 58 in | Wt 91.6 lb

## 2023-06-13 DIAGNOSIS — R232 Flushing: Secondary | ICD-10-CM | POA: Diagnosis not present

## 2023-06-13 DIAGNOSIS — R300 Dysuria: Secondary | ICD-10-CM

## 2023-06-13 DIAGNOSIS — R5383 Other fatigue: Secondary | ICD-10-CM | POA: Diagnosis not present

## 2023-06-13 DIAGNOSIS — M545 Low back pain, unspecified: Secondary | ICD-10-CM | POA: Diagnosis not present

## 2023-06-13 DIAGNOSIS — H6123 Impacted cerumen, bilateral: Secondary | ICD-10-CM

## 2023-06-13 DIAGNOSIS — N3001 Acute cystitis with hematuria: Secondary | ICD-10-CM

## 2023-06-13 DIAGNOSIS — L219 Seborrheic dermatitis, unspecified: Secondary | ICD-10-CM | POA: Diagnosis not present

## 2023-06-13 DIAGNOSIS — R202 Paresthesia of skin: Secondary | ICD-10-CM | POA: Diagnosis not present

## 2023-06-13 DIAGNOSIS — J029 Acute pharyngitis, unspecified: Secondary | ICD-10-CM

## 2023-06-13 DIAGNOSIS — E119 Type 2 diabetes mellitus without complications: Secondary | ICD-10-CM

## 2023-06-13 LAB — POCT URINALYSIS DIP (CLINITEK)
Bilirubin, UA: NEGATIVE
Glucose, UA: NEGATIVE mg/dL
Ketones, POC UA: NEGATIVE mg/dL
Nitrite, UA: NEGATIVE
POC PROTEIN,UA: NEGATIVE
Spec Grav, UA: 1.02 (ref 1.010–1.025)
Urobilinogen, UA: 0.2 U/dL
pH, UA: 6 (ref 5.0–8.0)

## 2023-06-13 MED ORDER — DOXYCYCLINE HYCLATE 100 MG PO TABS: 100.0000 mg | ORAL_TABLET | Freq: Two times a day (BID) | ORAL | 0 refills | Status: DC

## 2023-06-13 MED ORDER — NYSTATIN-TRIAMCINOLONE 100000-0.1 UNIT/GM-% EX OINT: 1.0000 | TOPICAL_OINTMENT | Freq: Two times a day (BID) | CUTANEOUS | 0 refills | Status: AC

## 2023-06-13 NOTE — Assessment & Plan Note (Signed)
 Management per specialist.  Needs new walker. Recommended go to [facility] with order.

## 2023-06-13 NOTE — Patient Instructions (Signed)
 For scalp use mycolog apply to affected area twice daily. Continue selenium sulfide shampoo as prescribed.   Check labs.   For urinary tract infection and for sore throat: doxycycline .   Recommend quit smoking.   Checking labs.   Go to [facility] for walker.

## 2023-06-13 NOTE — Assessment & Plan Note (Signed)
 Start on doxy. UA and Urine culture done.

## 2023-06-13 NOTE — Assessment & Plan Note (Signed)
 Check labs

## 2023-06-13 NOTE — Assessment & Plan Note (Signed)
 Recommend ketoconazole  shampoo use twice a week for 4 weeks.

## 2023-06-13 NOTE — Progress Notes (Signed)
 Acute Office Visit  Subjective:    Patient ID: Allison Jimenez, female    DOB: 05/06/1950, 73 y.o.   MRN: [id number]  Chief Complaint  Patient presents with   Urinary Tract Infection    HPI: Patient is in today for dysuria x 1 week. Denies fever/chills/back pain/abd pain/vomiting. Some nausea.  Hot flashes started 3 weeks ago.   Patient continues to be hoarse. Took antibiotic without improvement. Requesting another. Patient has sore throat. Still smoking. Doing warm salt water  gargle.   Patient is having continuous back pain and is seen by the [facility] who provide her oxycodone . She tries to stretch them to every 7 hours, but will start to have tingling on her legs and arms.  Her rollator walker is too heavy for her. Her current walker she has with her is very worn out and part of it is broken.   Patient is also complaining of malaise and fatigue and just overall feeling poorly.   Her seborrheic dermatitis of her scalp is not healing despite using selenium sulfide shampoo.  Past Medical History:  Diagnosis Date   Acute blood loss anemia (ABLA) 09/05/2020   Allergy    ANXIETY 06/05/2006   Arthritis    SHOULDERS    BACK PAIN 01/05/2010   Carotid artery occlusion    Cataract    bilateral, left worse   Centrilobular emphysema (HCC)    COPD (chronic obstructive pulmonary disease) (HCC)    Depression    DIVERTICULOSIS, COLON 06/05/2006   DIZZINESS OR VERTIGO 06/05/2006   positional   GERD (gastroesophageal reflux disease)    Headache    Migraines   Hearing loss in left ear    no hearing aid   History of blood transfusion 08/2020   History of kidney stones    passed stones   HYPERLIPIDEMIA 06/05/2006   Hypertension    HYPOKALEMIA 12/14/2008   Hypothyroidism    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   Qualifier: Diagnosis of  By: Minnette Amato  MD, Ronie Cohen    Pneumonia    Pre-diabetes    TOBACCO USER 11/25/2007    Past Surgical History:  Procedure Laterality  Date   APPENDECTOMY     bilateral cataract surgery      BRONCHIAL BIOPSY  09/28/2021   Procedure: BRONCHIAL BIOPSIES;  Surgeon: Gloriajean Large, MD;  Location: [facility] ENDOSCOPY;  Service: Pulmonary;;   BRONCHIAL NEEDLE ASPIRATION BIOPSY  09/28/2021   Procedure: BRONCHIAL NEEDLE ASPIRATION BIOPSIES;  Surgeon: Gloriajean Large, MD;  Location: [facility] ENDOSCOPY;  Service: Pulmonary;;   BRONCHIAL WASHINGS  09/28/2021   Procedure: BRONCHIAL WASHINGS;  Surgeon: Gloriajean Large, MD;  Location: [facility] ENDOSCOPY;  Service: Pulmonary;;   CESAREAN SECTION     x1   CHOLECYSTECTOMY     COLONOSCOPY     COSMETIC SURGERY     CYSTOSCOPY W/ URETERAL STENT PLACEMENT Right 07/29/2022   Procedure: CYSTOSCOPY WITH RETROGRADE PYELOGRAM/URETERAL STENT PLACEMENT;  Surgeon: Marco Severs, MD;  Location: [facility] ORS;  Service: Urology;  Laterality: Right;   CYSTOSCOPY/URETEROSCOPY/HOLMIUM LASER/STENT PLACEMENT Right 09/05/2022   Procedure: RIGHT URETEROSCOPY/HOLMIUM LASER/STENT PLACEMENT;  Surgeon: Mallie Seal, MD;  Location: [facility] ORS;  Service: Urology;  Laterality: Right;   mastoid surgery  1992   shunt in mastoid- endolymphatic sac in left ear    MRI  07/11/2020   x several, last one located in CE   REVERSE SHOULDER ARTHROPLASTY Right 01/24/2023   Procedure: REVERSE SHOULDER ARTHROPLASTY;  Surgeon: Ellard Gunning, MD;  Location: [facility] ORS;  Service: Orthopedics;  Laterality: Right;  120 min   TUBAL LIGATION     UPPER GI ENDOSCOPY     vocal cord surgery     nodule x2    Family History  Problem Relation Age of Onset   Breast cancer Mother    Dementia Mother    Heart disease Mother    Hypertension Mother    Thyroid  disease Mother    Alzheimer's disease Father    Heart disease Father    Hypertension Father    Thyroid  disease Father    Diabetes Sister    Colon cancer Neg Hx    Rectal cancer Neg Hx    Stomach cancer Neg Hx    Colon polyps Neg Hx    Esophageal cancer Neg Hx     Social History    Socioeconomic History   Marital status: Divorced    Spouse name: Not on file   Number of children: 1   Years of education: 13   Highest education level: Not on file  Occupational History   Occupation: ASSISTANT CREDIT MANAGER    Employer: [employer]    Comment: Retired  Tobacco Use   Smoking status: Every Day    Current packs/day: 0.50    Average packs/day: 0.5 packs/day for 49.0 years (24.5 ttl pk-yrs)    Types: Cigarettes   Smokeless tobacco: Never   Tobacco comments:    3/4 ppd 08/30/21 [name], CMA  Vaping Use   Vaping status: Former   Start date: 01/01/2010   Quit date: 08/01/2021   Substances: Nicotine    Devices: menthol/fruit flavor  Substance and Sexual Activity   Alcohol  use: No    Alcohol /week: 0.0 standard drinks of alcohol    Drug use: No   Sexual activity: Not Currently    Birth control/protection: Post-menopausal  Other Topics Concern   Not on file  Social History Narrative   Patient lives at home alone. Patient has a Biochemist, clinical. Patient is divorced .   Patient is retired.   Education high school.   Right handed.   Caffeine none.   Social Drivers of Corporate investment banker Strain: Low Risk  (05/23/2023)   Received from [facility]   Overall Financial Resource Strain (CARDIA)    Difficulty of Paying Living Expenses: Not hard at all  Food Insecurity: No Food Insecurity (05/23/2023)   Received from [facility]   Hunger Vital Sign    Worried About Running Out of Food in the Last Year: Never true    Ran Out of Food in the Last Year: Never true  Transportation Needs: No Transportation Needs (05/23/2023)   Received from [facility] - Transportation    Lack of Transportation (Medical): No    Lack of Transportation (Non-Medical): No  Physical Activity: Inactive (10/31/2022)   Exercise Vital Sign    Days of Exercise per Week: 0 days    Minutes of Exercise per Session: 0 min  Stress: No Stress Concern  Present (10/31/2022)   [employer] of Occupational Health - Occupational Stress Questionnaire    Feeling of Stress : Not at all  Social Connections: Socially Isolated (10/31/2022)   Social Connection and Isolation Panel    Frequency of Communication with Friends and Family: Three times a week    Frequency of Social Gatherings with Friends and Family: Three times a week    Attends Religious Services: Never    Active Member of Clubs or  Organizations: No    Attends Banker Meetings: Never    Marital Status: Divorced  Catering manager Violence: Not At Risk (07/29/2022)   Humiliation, Afraid, Rape, and Kick questionnaire    Fear of Current or Ex-Partner: No    Emotionally Abused: No    Physically Abused: No    Sexually Abused: No    Outpatient Medications Prior to Visit  Medication Sig Dispense Refill   acetaminophen  (TYLENOL ) 500 MG tablet Take 500-1,000 mg by mouth every 6 (six) hours as needed for mild pain, moderate pain or headache.     albuterol  (VENTOLIN  HFA) 108 (90 Base) MCG/ACT inhaler Inhale 2 puffs into the lungs every 6 (six) hours as needed for shortness of breath or wheezing. 8 g 2   atorvastatin  (LIPITOR) 20 MG tablet TAKE 1 TABLET(20 MG) BY MOUTH DAILY 90 tablet 1   calcitonin, salmon, (MIACALCIN /FORTICAL) 200 UNIT/ACT nasal spray Place 1 spray into alternate nostrils daily.     Carboxymethylcellul-Glycerin (LUBRICATING EYE DROPS OP) Place 1 drop into both eyes daily.     carvedilol  (COREG ) 25 MG tablet TAKE 1 TABLET(25 MG) BY MOUTH TWICE DAILY WITH A MEAL 180 tablet 0   cefdinir  (OMNICEF ) 300 MG capsule Take 1 capsule (300 mg total) by mouth 2 (two) times daily. 20 capsule 0   cetirizine (ZYRTEC) 10 MG tablet Take 10 mg by mouth daily as needed for allergies.     chlorthalidone  (HYGROTON ) 25 MG tablet TAKE 1 TABLET(25 MG) BY MOUTH DAILY 90 tablet 1   Cholecalciferol (VITAMIN D ) 50 MCG (2000 UT) CAPS Take 2,000 Units by mouth daily.     conjugated  estrogens  (PREMARIN ) vaginal cream Place 1 applicator vaginally daily as needed (dryness / burning).     cyclobenzaprine  (FLEXERIL ) 10 MG tablet Take 1 tablet (10 mg total) by mouth 3 (three) times daily as needed for muscle spasms. 30 tablet 1   diclofenac  Sodium (VOLTAREN ) 1 % GEL Apply 1 Application topically 4 (four) times daily as needed (pain).     famotidine  (PEPCID ) 40 MG tablet TAKE 1 TABLET(40 MG) BY MOUTH AT BEDTIME 90 tablet 1   fluorometholone (FML) 0.1 % ophthalmic suspension INSTILL 1 DROP INTO LEFT EYE THREE TIMES DAILY FOR 3 DAYS THEN TWICE DAILY FOR 3 DAYS THEN EVERY DAY FOR 3 DAYS THEN STOP     FLUoxetine  (PROZAC ) 20 MG capsule Take 3 capsules (60 mg total) by mouth daily. 270 capsule 0   fluticasone  (FLONASE ) 50 MCG/ACT nasal spray Place 2 sprays into both nostrils daily. SHAKE LIQUID AND USE 2 SPRAYS IN EACH NOSTRIL DAILY 16 g 6   Fluticasone -Umeclidin-Vilant (TRELEGY ELLIPTA ) 100-62.5-25 MCG/ACT AEPB INHALE 1 PUFF INTO THE LUNGS DAILY 180 each 3   ketoconazole  (NIZORAL ) 2 % shampoo Apply 1 Application topically 2 (two) times a week. 120 mL 0   levothyroxine  (SYNTHROID ) 25 MCG tablet TAKE 1 TABLET(25 MCG) BY MOUTH DAILY 90 tablet 0   montelukast  (SINGULAIR ) 10 MG tablet Take 1 tablet (10 mg total) by mouth at bedtime. 90 tablet 3   ondansetron  (ZOFRAN ) 4 MG tablet Take 1 tablet (4 mg total) by mouth every 8 (eight) hours as needed for nausea or vomiting. 10 tablet 0   OVER THE COUNTER MEDICATION Take 1 tablet by mouth in the morning. Salt Supplement     oxyCODONE -acetaminophen  (PERCOCET) 10-325 MG tablet Take 1 tablet by mouth every 8 (eight) hours as needed for pain.     pantoprazole  (PROTONIX ) 40 MG tablet TAKE 1 TABLET(40  MG) BY MOUTH DAILY 90 tablet 0   polyethylene glycol (MIRALAX  / GLYCOLAX ) 17 g packet Take 17 g by mouth daily.     potassium chloride  (MICRO-K ) 10 MEQ CR capsule TAKE 2 CAPSULES(20 MEQ) BY MOUTH TWICE DAILY 360 capsule 1   pregabalin  (LYRICA ) 25 MG capsule  Take 1 capsule (25 mg total) by mouth at bedtime. 90 capsule 0   promethazine  (PHENERGAN ) 25 MG tablet TAKE 1 TABLET BY MOUTH EVERY 8 HOURS IF NEEDED 20 tablet 0   rizatriptan  (MAXALT ) 10 MG tablet Take 10 mg by mouth as needed for migraine.     vitamin B-12 (CYANOCOBALAMIN ) 500 MCG tablet Take 500 mcg by mouth daily at 12 noon.     No facility-administered medications prior to visit.    Allergies  Allergen Reactions   Erythromycin Base Diarrhea   Vfend  [Voriconazole ] Other (See Comments)    Visual changes   Penicillins Rash    Review of Systems  Constitutional:  Positive for diaphoresis and fatigue. Negative for chills and fever.  HENT:  Negative for congestion and sore throat.   Respiratory:  Negative for cough and shortness of breath.   Cardiovascular:  Negative for chest pain.  Gastrointestinal:  Negative for abdominal pain, nausea and vomiting.  Genitourinary:  Positive for dysuria. Negative for flank pain, frequency and urgency.  Musculoskeletal:  Positive for back pain.       Objective:        06/13/2023    9:44 AM 05/30/2023   10:11 AM 05/14/2023   11:09 AM  Vitals with BMI  Height 4' 10 4' 10   Weight 91 lbs 10 oz 91 lbs 3 oz   BMI 19.15 19.07   Systolic 122 136 409  Diastolic 64 80 74  Pulse 83 62     No data found.   Physical Exam Vitals reviewed.  Constitutional:      Appearance: Normal appearance.  HENT:     Right Ear: There is impacted cerumen.     Left Ear: There is impacted cerumen.     Nose: Nose normal.     Mouth/Throat:     Pharynx: Posterior oropharyngeal erythema (pharynx. symmetrical) present. No oropharyngeal exudate.   Cardiovascular:     Rate and Rhythm: Normal rate and regular rhythm.     Heart sounds: Normal heart sounds. No murmur heard. Pulmonary:     Effort: Pulmonary effort is normal. No respiratory distress.     Breath sounds: Normal breath sounds.  Abdominal:     Palpations: Abdomen is soft.     Tenderness: There is no  abdominal tenderness.  Lymphadenopathy:     Cervical: No cervical adenopathy.   Neurological:     Mental Status: She is alert and oriented to person, place, and time.     Gait: Gait abnormal (walks leaning forward a little.).   Psychiatric:        Mood and Affect: Mood normal.        Behavior: Behavior normal.     Health Maintenance Due  Topic Date Due   Zoster Vaccines- Shingrix (2 of 2) 06/22/2016   DTaP/Tdap/Td (2 - Td or Tdap) 05/17/2020   Diabetic kidney evaluation - Urine ACR  01/26/2023   COVID-19 Vaccine (7 - Pfizer risk 2024-25 season) 03/17/2023    There are no preventive care reminders to display for this patient.   Lab Results  Component Value Date   TSH 2.640 09/17/2022   Lab Results  Component Value Date  WBC 8.3 04/09/2023   HGB 12.5 04/09/2023   HCT 39.3 04/09/2023   MCV 91.2 04/09/2023   PLT 327 04/09/2023   Lab Results  Component Value Date   NA 140 03/27/2023   K 4.4 03/27/2023   CO2 19 (L) 03/27/2023   GLUCOSE 84 03/27/2023   BUN 13 03/27/2023   CREATININE 0.60 03/27/2023   BILITOT 0.3 03/27/2023   ALKPHOS 102 03/27/2023   AST 14 03/27/2023   ALT 11 03/27/2023   PROT 6.8 03/27/2023   ALBUMIN 4.3 03/27/2023   CALCIUM  10.0 03/27/2023   ANIONGAP 4 (L) 01/14/2023   EGFR 96 03/27/2023   GFR 75.48 07/09/2016   Lab Results  Component Value Date   CHOL 104 03/27/2023   Lab Results  Component Value Date   HDL 41 03/27/2023   Lab Results  Component Value Date   LDLCALC 44 03/27/2023   Lab Results  Component Value Date   TRIG 102 03/27/2023   Lab Results  Component Value Date   CHOLHDL 2.5 03/27/2023   Lab Results  Component Value Date   HGBA1C 5.1 03/27/2023       Assessment & Plan:  Dysuria Assessment & Plan: Start on doxy. UA and Urine culture done.   Orders: -     Urine Culture -     POCT URINALYSIS DIP (CLINITEK) -     Doxycycline  Hyclate; Take 1 tablet (100 mg total) by mouth 2 (two) times daily.  Dispense: 20  tablet; Refill: 0 -     CBC with Differential/Platelet -     Comprehensive metabolic panel with GFR -     TSH  Acute pharyngitis, unspecified etiology Assessment & Plan: Doxycycline . Continue warm salt water  gargles.  Quit smoking.    Hot flashes Assessment & Plan: Check labs.   Orders: -     CBC with Differential/Platelet -     Comprehensive metabolic panel with GFR -     TSH  Other fatigue Assessment & Plan: Check labs.    Paresthesia -     B12 and Folate Panel -     Methylmalonic acid, serum  Seborrheic dermatitis Assessment & Plan: Recommend ketoconazole  shampoo use twice a week for 4 weeks.   Orders: -     Nystatin -Triamcinolone ; Apply 1 Application topically 2 (two) times daily.  Dispense: 30 g; Refill: 0  Lumbar back pain Assessment & Plan: Management per specialist.  Needs new walker. [facility].    Bilateral impacted cerumen Assessment & Plan: Successfully irrigated.    Acute cystitis with hematuria Assessment & Plan: Start on doxy. UA and Urine culture done.       Meds ordered this encounter  Medications   doxycycline  (VIBRA -TABS) 100 MG tablet    Sig: Take 1 tablet (100 mg total) by mouth 2 (two) times daily.    Dispense:  20 tablet    Refill:  0   nystatin -triamcinolone  ointment (MYCOLOG)    Sig: Apply 1 Application topically 2 (two) times daily.    Dispense:  30 g    Refill:  0    Orders Placed This Encounter  Procedures   Urine Culture   CBC with Differential/Platelet   Comprehensive metabolic panel with GFR   TSH   B12 and Folate Panel   Methylmalonic acid, serum   POCT URINALYSIS DIP (CLINITEK)     Follow-up: No follow-ups on file.  An After Visit Summary was printed and given to the patient.  Mercy Stall, MD [facility] [phone]

## 2023-06-13 NOTE — Assessment & Plan Note (Signed)
 Doxycycline . Continue warm salt water  gargles.  Quit smoking.

## 2023-06-13 NOTE — Assessment & Plan Note (Signed)
 Successfully irrigated.

## 2023-06-16 LAB — URINE CULTURE

## 2023-06-17 ENCOUNTER — Ambulatory Visit: Payer: Self-pay | Admitting: Family Medicine

## 2023-06-17 LAB — CBC WITH DIFFERENTIAL/PLATELET
Basophils Absolute: 0.1 10*3/uL (ref 0.0–0.2)
Basos: 1 %
EOS (ABSOLUTE): 0.3 10*3/uL (ref 0.0–0.4)
Eos: 2 %
Hematocrit: 41.6 % (ref 34.0–46.6)
Hemoglobin: 13.3 g/dL (ref 11.1–15.9)
Immature Grans (Abs): 0 10*3/uL (ref 0.0–0.1)
Immature Granulocytes: 0 %
Lymphocytes Absolute: 2.7 10*3/uL (ref 0.7–3.1)
Lymphs: 21 %
MCH: 30.2 pg (ref 26.6–33.0)
MCHC: 32 g/dL (ref 31.5–35.7)
MCV: 94 fL (ref 79–97)
Monocytes Absolute: 0.8 10*3/uL (ref 0.1–0.9)
Monocytes: 6 %
Neutrophils Absolute: 9.1 10*3/uL — ABNORMAL HIGH (ref 1.4–7.0)
Neutrophils: 70 %
Platelets: 351 10*3/uL (ref 150–450)
RBC: 4.41 x10E6/uL (ref 3.77–5.28)
RDW: 13.9 % (ref 11.7–15.4)
WBC: 13 10*3/uL — ABNORMAL HIGH (ref 3.4–10.8)

## 2023-06-17 LAB — COMPREHENSIVE METABOLIC PANEL WITH GFR
ALT: 8 IU/L (ref 0–32)
AST: 11 IU/L (ref 0–40)
Albumin: 4.1 g/dL (ref 3.8–4.8)
Alkaline Phosphatase: 98 IU/L (ref 44–121)
BUN/Creatinine Ratio: 25 (ref 12–28)
BUN: 13 mg/dL (ref 8–27)
Bilirubin Total: 0.2 mg/dL (ref 0.0–1.2)
CO2: 25 mmol/L (ref 20–29)
Calcium: 9.9 mg/dL (ref 8.7–10.3)
Chloride: 96 mmol/L (ref 96–106)
Creatinine, Ser: 0.51 mg/dL — ABNORMAL LOW (ref 0.57–1.00)
Globulin, Total: 2.5 g/dL (ref 1.5–4.5)
Glucose: 91 mg/dL (ref 70–99)
Potassium: 3.8 mmol/L (ref 3.5–5.2)
Sodium: 135 mmol/L (ref 134–144)
Total Protein: 6.6 g/dL (ref 6.0–8.5)
eGFR: 100 mL/min/{1.73_m2} (ref >59)

## 2023-06-17 LAB — B12 AND FOLATE PANEL
Folate: 4.4 ng/mL (ref >3.0)
Vitamin B-12: 466 pg/mL (ref 232–1245)

## 2023-06-17 LAB — METHYLMALONIC ACID, SERUM: Methylmalonic Acid: 251 nmol/L (ref 0–378)

## 2023-06-17 LAB — TSH: TSH: 1.98 u[IU]/mL (ref 0.450–4.500)

## 2023-06-17 MED ORDER — CEPHALEXIN 500 MG PO CAPS: 500.0000 mg | ORAL_CAPSULE | Freq: Two times a day (BID) | ORAL | 0 refills | Status: DC

## 2023-06-18 ENCOUNTER — Ambulatory Visit: Payer: Self-pay

## 2023-06-18 NOTE — Telephone Encounter (Signed)
 Patient made aware, to stop the doxycycline  and start Keflex 

## 2023-06-18 NOTE — Telephone Encounter (Signed)
 FYI Only or Action Required?: Action required by provider  Patient was last seen in primary care on 06/13/2023 by Mercy Stall, MD. Called Nurse Triage reporting Lab Results/Medication Question. Symptoms are: unchanged.  Triage Disposition: Call PCP Now  Patient/caregiver understands and will follow disposition?: Yes                   Copied from CRM [id number]. Topic: Clinical - Lab/Test Results >> Jun 18, 2023 10:14 AM Rennis Case wrote: Reason for CRM: Relayed results, pt has additional questions. Reason for Disposition  [1] Pharmacy calling with prescription question AND [2] triager unable to answer question  Answer Assessment - Initial Assessment Questions 1. NAME of MEDICINE: What medicine(s) are you calling about?     Doxycycline  and Keflex  --- Patient received a text message from the Pharmacy about a new Keflex  prescription. She wasn't told anything about this medication being sent in and didn't know if she was supposed to finish the Doxycycline  first or stop the Doxycycline  since she also was seen for her throat.  2. QUESTION: What is your question? (e.g., double dose of medicine, side effect)     Patient states that she hasn't gotten a call about her labwork and what was going on with her UTI. Patient wants lab work emailed to her. She states that she usually gets this emailed to her. [email or web address] is where patient usually gets her results emailed to. Sore throat is better patient is still having itching.  3. PRESCRIBER: Who prescribed the medicine? Reason: if prescribed by specialist, call should be referred to that group.     Dr Mercy Stall  4. SYMPTOMS: Do you have any symptoms? If Yes, ask: What symptoms are you having?  How bad are the symptoms (e.g., mild, moderate, severe)     Vaginal itching still present  Patient's contact number is [phone]   This RN read the patient this message from her PCP Dr Mercy Stall: Urinary tract  infection not sensitive to doxycycline .  Sent keflex  500 mg twice daily x 7 days  Patient wanted to know if she was supposed to stop the Doxycycline  completely and what all her results showed. She is advised that once her PCP has completely read her results someone will contact her.  Protocols used: Medication Question Call-A-AH

## 2023-06-19 ENCOUNTER — Telehealth: Payer: Self-pay

## 2023-06-19 DIAGNOSIS — M8000XD Age-related osteoporosis with current pathological fracture, unspecified site, subsequent encounter for fracture with routine healing: Secondary | ICD-10-CM

## 2023-06-19 NOTE — Telephone Encounter (Signed)
 Copied from CRM [id number]. Topic: Clinical - Medication Question >> Jun 18, 2023  2:49 PM Sanjuana Crutch wrote: Reason for CRM: Ilda Malkin from [facility] called in to see if we need to fill prolia  injection as they received 06/03/23. Called yesterday and following up again today 06/17  May you please advise.    [phone]

## 2023-06-19 NOTE — Telephone Encounter (Signed)
 Spoke with [facility] again confirming that the Prolia  does need to be shipped to the office.  Patient has upcoming appointment 7/2.  Pharmacy will reach back out to patient for approval prior to shipping.

## 2023-06-20 ENCOUNTER — Other Ambulatory Visit: Payer: Self-pay

## 2023-06-20 DIAGNOSIS — M79641 Pain in right hand: Secondary | ICD-10-CM | POA: Diagnosis not present

## 2023-06-20 DIAGNOSIS — G5623 Lesion of ulnar nerve, bilateral upper limbs: Secondary | ICD-10-CM | POA: Diagnosis not present

## 2023-06-20 DIAGNOSIS — M65342 Trigger finger, left ring finger: Secondary | ICD-10-CM | POA: Diagnosis not present

## 2023-06-20 DIAGNOSIS — M79642 Pain in left hand: Secondary | ICD-10-CM | POA: Diagnosis not present

## 2023-06-20 DIAGNOSIS — M65341 Trigger finger, right ring finger: Secondary | ICD-10-CM | POA: Diagnosis not present

## 2023-06-20 DIAGNOSIS — G5601 Carpal tunnel syndrome, right upper limb: Secondary | ICD-10-CM | POA: Diagnosis not present

## 2023-06-21 ENCOUNTER — Other Ambulatory Visit: Payer: Self-pay | Admitting: Family Medicine

## 2023-06-27 ENCOUNTER — Other Ambulatory Visit: Payer: Self-pay | Admitting: Family Medicine

## 2023-06-27 DIAGNOSIS — F411 Generalized anxiety disorder: Secondary | ICD-10-CM

## 2023-06-27 MED ORDER — DENOSUMAB 60 MG/ML ~~LOC~~ SOSY
60.0000 mg | PREFILLED_SYRINGE | Freq: Once | SUBCUTANEOUS | Status: AC
  Administered 2023-07-03: 60 mg via SUBCUTANEOUS

## 2023-06-27 NOTE — Addendum Note (Signed)
 Addended by: CLAUDENE SUZEN HERO on: 06/27/2023 01:04 PM   Modules accepted: Orders

## 2023-06-27 NOTE — Telephone Encounter (Signed)
 Prolia  received from [facility] 06/27/23

## 2023-06-30 ENCOUNTER — Encounter: Payer: Self-pay | Admitting: Family Medicine

## 2023-07-01 ENCOUNTER — Other Ambulatory Visit: Payer: Self-pay

## 2023-07-02 NOTE — Progress Notes (Signed)
 Subjective:  Patient ID: Allison Jimenez, female    DOB: April 06, 1950  Age: 73 y.o. MRN: [id number]  Chief Complaint  Patient presents with   Medical Management of Chronic Issues    Discussed the use of AI scribe software for clinical note transcription with the patient, who gave verbal consent to proceed.  History of Present Illness   Allison Jimenez is a 73 year old female who presents for a regular follow-up visit.  Diabetes: last A1c 5.1%. Not eating healthy. Unable to exercise due to foot and back pain.  Not checking sugars.   Oropharyngeal symptoms - Recently completed a course of doxycycline  for throat symptoms with partial relief - Intermittent throat symptoms persist - Symptoms may be exacerbated by heat  Chronic pain management - Takes oxycodone  10/325 mg three to four times daily for pain control - Pain is improved with current regimen, but she feels her body has become accustomed to the medication - Received two cortisone injections in the lower back  Mood and anxiety symptoms - Takes Prozac  for anxiety - Experiences intermittent depression attributed to lifestyle changes - No significant depression, but occasional low mood days  Tobacco use - Continues to smoke - Feels ambivalent about quitting - Smoking provides a sense of contentment - Tobacco: 10-12 cigarettes per day.  - ON trelegy one inhalation daily for copd.   Gastroesophageal reflux symptoms - Takes Protonix  and Pepcid  for acid reflux  Hyperlipidemia - Takes atorvastatin  20 mg once daily for cholesterol management - Cholesterol last checked in March  Hypertension - Takes chlorthalidone  and carvedilol  for blood pressure control  Hematologic monitoring - Scheduled to see hematology in October for follow-up on hemoglobin levels - Last hemoglobin recorded at 13.3  Lung cancer screening - CT lung scan scheduled for the end of the month         07/03/2023   10:55 AM 03/27/2023   11:03 AM  12/24/2022   11:37 AM 09/27/2022    4:21 PM 09/17/2022    3:07 PM  Depression screen PHQ 2/9  Decreased Interest 1 2 0 1 1  Down, Depressed, Hopeless 0 1 0 1 0  PHQ - 2 Score 1 3 0 2 1  Altered sleeping 2 1 0  1  Tired, decreased energy 1 2 0  1  Change in appetite 0 1 0    Feeling bad or failure about yourself  0 1 0  0  Trouble concentrating 0 0 0  0  Moving slowly or fidgety/restless 0 1 0  0  Suicidal thoughts 0 1 0  0  PHQ-9 Score 4 10 0  3  Difficult doing work/chores Not difficult at all Somewhat difficult Not difficult at all  Not difficult at all        07/03/2023   10:54 AM  Fall Risk   Falls in the past year? 0  Number falls in past yr: 0  Injury with Fall? 0  Risk for fall due to : Impaired balance/gait  Follow up Falls evaluation completed    Patient Care Team: Sherre Clapper, MD as PCP - General (Family Medicine) Ruthell Lauraine FALCON, NP as Nurse Practitioner (Pulmonary Disease) Livingston No, PA-C as Physician Assistant (General Practice) Misenheimer, Evalene, MD as Consulting Physician (Gastroenterology) Waylan Cain, MD as Consulting Physician (Ophthalmology) Rudy Cain CHRISTELLA DEVONNA (Physician Assistant) Gladis Leonor CHRISTELLA, MD as Consulting Physician (Pulmonary Disease) Ezzard Valaria LABOR, MD as Consulting Physician (Oncology) Pandora Cadet, [facility] (Pharmacist)   Review of  Systems  Constitutional:  Negative for chills, fatigue and fever.  HENT:  Negative for congestion, ear pain, rhinorrhea and sore throat.   Respiratory:  Negative for cough and shortness of breath.   Cardiovascular:  Negative for chest pain.  Gastrointestinal:  Negative for abdominal pain, constipation, diarrhea, nausea and vomiting.  Genitourinary:  Negative for dysuria and urgency.  Musculoskeletal:  Negative for back pain and myalgias.  Neurological:  Negative for dizziness, weakness, light-headedness and headaches.  Psychiatric/Behavioral:  Negative for dysphoric mood. The patient is not  nervous/anxious.     Current Outpatient Medications on File Prior to Visit  Medication Sig Dispense Refill   acetaminophen  (TYLENOL ) 500 MG tablet Take 500-1,000 mg by mouth every 6 (six) hours as needed for mild pain, moderate pain or headache.     albuterol  (VENTOLIN  HFA) 108 (90 Base) MCG/ACT inhaler Inhale 2 puffs into the lungs every 6 (six) hours as needed for shortness of breath or wheezing. 8 g 2   atorvastatin  (LIPITOR) 20 MG tablet TAKE 1 TABLET(20 MG) BY MOUTH DAILY 90 tablet 1   calcitonin, salmon, (MIACALCIN /FORTICAL) 200 UNIT/ACT nasal spray Place 1 spray into alternate nostrils daily.     Carboxymethylcellul-Glycerin (LUBRICATING EYE DROPS OP) Place 1 drop into both eyes daily.     carvedilol  (COREG ) 25 MG tablet TAKE 1 TABLET(25 MG) BY MOUTH TWICE DAILY WITH A MEAL 180 tablet 0   cetirizine (ZYRTEC) 10 MG tablet Take 10 mg by mouth daily as needed for allergies.     chlorthalidone  (HYGROTON ) 25 MG tablet TAKE 1 TABLET(25 MG) BY MOUTH DAILY 90 tablet 1   Cholecalciferol (VITAMIN D ) 50 MCG (2000 UT) CAPS Take 2,000 Units by mouth daily.     conjugated estrogens  (PREMARIN ) vaginal cream Place 1 applicator vaginally daily as needed (dryness / burning).     cyclobenzaprine  (FLEXERIL ) 10 MG tablet Take 1 tablet (10 mg total) by mouth 3 (three) times daily as needed for muscle spasms. 30 tablet 1   diclofenac  Sodium (VOLTAREN ) 1 % GEL Apply 1 Application topically 4 (four) times daily as needed (pain).     EMGALITY 120 MG/ML SOAJ      famotidine  (PEPCID ) 40 MG tablet TAKE 1 TABLET(40 MG) BY MOUTH AT BEDTIME 90 tablet 1   fluorometholone (FML) 0.1 % ophthalmic suspension INSTILL 1 DROP INTO LEFT EYE THREE TIMES DAILY FOR 3 DAYS THEN TWICE DAILY FOR 3 DAYS THEN EVERY DAY FOR 3 DAYS THEN STOP     fluticasone  (FLONASE ) 50 MCG/ACT nasal spray Place 2 sprays into both nostrils daily. SHAKE LIQUID AND USE 2 SPRAYS IN EACH NOSTRIL DAILY 16 g 6   Fluticasone -Umeclidin-Vilant (TRELEGY ELLIPTA )  100-62.5-25 MCG/ACT AEPB INHALE 1 PUFF INTO THE LUNGS DAILY 180 each 3   ketoconazole  (NIZORAL ) 2 % shampoo Apply 1 Application topically 2 (two) times a week. 120 mL 0   levothyroxine  (SYNTHROID ) 25 MCG tablet TAKE 1 TABLET(25 MCG) BY MOUTH DAILY 90 tablet 0   montelukast  (SINGULAIR ) 10 MG tablet Take 1 tablet (10 mg total) by mouth at bedtime. 90 tablet 3   nystatin -triamcinolone  ointment (MYCOLOG) Apply 1 Application topically 2 (two) times daily. 30 g 0   ondansetron  (ZOFRAN ) 4 MG tablet Take 1 tablet (4 mg total) by mouth every 8 (eight) hours as needed for nausea or vomiting. 10 tablet 0   OVER THE COUNTER MEDICATION Take 1 tablet by mouth in the morning. Salt Supplement     oxyCODONE -acetaminophen  (PERCOCET) 10-325 MG tablet Take 1 tablet by  mouth every 8 (eight) hours as needed for pain.     pantoprazole  (PROTONIX ) 40 MG tablet TAKE 1 TABLET(40 MG) BY MOUTH DAILY 90 tablet 0   polyethylene glycol (MIRALAX  / GLYCOLAX ) 17 g packet Take 17 g by mouth daily.     potassium chloride  (MICRO-K ) 10 MEQ CR capsule TAKE 2 CAPSULES(20 MEQ) BY MOUTH TWICE DAILY 360 capsule 1   pregabalin  (LYRICA ) 25 MG capsule Take 1 capsule (25 mg total) by mouth at bedtime. 90 capsule 0   promethazine  (PHENERGAN ) 25 MG tablet TAKE 1 TABLET BY MOUTH EVERY 8 HOURS IF NEEDED 20 tablet 0   rizatriptan  (MAXALT ) 10 MG tablet Take 10 mg by mouth as needed for migraine.     vitamin B-12 (CYANOCOBALAMIN ) 500 MCG tablet Take 500 mcg by mouth daily at 12 noon.     No current facility-administered medications on file prior to visit.   Past Medical History:  Diagnosis Date   Acute blood loss anemia (ABLA) 09/05/2020   Allergy    ANXIETY 06/05/2006   Arthritis    SHOULDERS    BACK PAIN 01/05/2010   Carotid artery occlusion    Cataract    bilateral, left worse   Centrilobular emphysema (HCC)    COPD (chronic obstructive pulmonary disease) (HCC)    Depression    DIVERTICULOSIS, COLON 06/05/2006   DIZZINESS OR VERTIGO  06/05/2006   positional   GERD (gastroesophageal reflux disease)    Headache    Migraines   Hearing loss in left ear    no hearing aid   History of blood transfusion 08/2020   History of kidney stones    passed stones   HYPERLIPIDEMIA 06/05/2006   Hypertension    HYPOKALEMIA 12/14/2008   Hypothyroidism    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   Qualifier: Diagnosis of  By: Jame  MD, Maude FALCON    Pneumonia    Pre-diabetes    TOBACCO USER 11/25/2007   Past Surgical History:  Procedure Laterality Date   APPENDECTOMY     bilateral cataract surgery      BRONCHIAL BIOPSY  09/28/2021   Procedure: BRONCHIAL BIOPSIES;  Surgeon: Gladis Leonor HERO, MD;  Location: [facility] ENDOSCOPY;  Service: Pulmonary;;   BRONCHIAL NEEDLE ASPIRATION BIOPSY  09/28/2021   Procedure: BRONCHIAL NEEDLE ASPIRATION BIOPSIES;  Surgeon: Gladis Leonor HERO, MD;  Location: [facility] ENDOSCOPY;  Service: Pulmonary;;   BRONCHIAL WASHINGS  09/28/2021   Procedure: BRONCHIAL WASHINGS;  Surgeon: Gladis Leonor HERO, MD;  Location: [facility] ENDOSCOPY;  Service: Pulmonary;;   CESAREAN SECTION     x1   CHOLECYSTECTOMY     COLONOSCOPY     COSMETIC SURGERY     CYSTOSCOPY W/ URETERAL STENT PLACEMENT Right 07/29/2022   Procedure: CYSTOSCOPY WITH RETROGRADE PYELOGRAM/URETERAL STENT PLACEMENT;  Surgeon: Sherrilee Belvie CROME, MD;  Location: [facility] ORS;  Service: Urology;  Laterality: Right;   CYSTOSCOPY/URETEROSCOPY/HOLMIUM LASER/STENT PLACEMENT Right 09/05/2022   Procedure: RIGHT URETEROSCOPY/HOLMIUM LASER/STENT PLACEMENT;  Surgeon: Lovie Arlyss CROME, MD;  Location: [facility] ORS;  Service: Urology;  Laterality: Right;   mastoid surgery  1992   shunt in mastoid- endolymphatic sac in left ear    MRI  07/11/2020   x several, last one located in CE   REVERSE SHOULDER ARTHROPLASTY Right 01/24/2023   Procedure: REVERSE SHOULDER ARTHROPLASTY;  Surgeon: Melita Drivers, MD;  Location: [facility] ORS;  Service: Orthopedics;  Laterality: Right;  120 min   TUBAL LIGATION     UPPER  GI ENDOSCOPY     vocal cord surgery  nodule x2    Family History  Problem Relation Age of Onset   Breast cancer Mother    Dementia Mother    Heart disease Mother    Hypertension Mother    Thyroid  disease Mother    Alzheimer's disease Father    Heart disease Father    Hypertension Father    Thyroid  disease Father    Diabetes Sister    Colon cancer Neg Hx    Rectal cancer Neg Hx    Stomach cancer Neg Hx    Colon polyps Neg Hx    Esophageal cancer Neg Hx    Social History   Socioeconomic History   Marital status: Divorced    Spouse name: Not on file   Number of children: 1   Years of education: 13   Highest education level: Not on file  Occupational History   Occupation: ASSISTANT CREDIT MANAGER    Employer: [employer]    Comment: Retired  Tobacco Use   Smoking status: Every Day    Current packs/day: 0.50    Average packs/day: 0.5 packs/day for 49.0 years (24.5 ttl pk-yrs)    Types: Cigarettes   Smokeless tobacco: Never   Tobacco comments:    3/4 ppd 08/30/21 Sonny Lecher, CMA  Vaping Use   Vaping status: Former   Start date: 01/01/2010   Quit date: 08/01/2021   Substances: Nicotine    Devices: menthol/fruit flavor  Substance and Sexual Activity   Alcohol  use: No    Alcohol /week: 0.0 standard drinks of alcohol    Drug use: No   Sexual activity: Not Currently    Birth control/protection: Post-menopausal  Other Topics Concern   Not on file  Social History Narrative   Patient lives at home alone. Patient has a Biochemist, clinical. Patient is divorced .   Patient is retired.   Education high school.   Right handed.   Caffeine none.   Social Drivers of Corporate investment banker Strain: Low Risk  (05/23/2023)   Received from [facility]   Overall Financial Resource Strain (CARDIA)    Difficulty of Paying Living Expenses: Not hard at all  Food Insecurity: No Food Insecurity (05/23/2023)   [facility] from [facility]   Hunger Vital Sign     Within the past 12 months, you worried that your food would run out before you got the money to buy more.: Never true    Within the past 12 months, the food you bought just didn't last and you didn't have money to get more.: Never true  Transportation Needs: No Transportation Needs (05/23/2023)   Received from [facility] - Transportation    Lack of Transportation (Medical): No    Lack of Transportation (Non-Medical): No  Physical Activity: Inactive (10/31/2022)   Exercise Vital Sign    Days of Exercise per Week: 0 days    Minutes of Exercise per Session: 0 min  Stress: No Stress Concern Present (10/31/2022)   [employer] of Occupational Health - Occupational Stress Questionnaire    Feeling of Stress : Not at all  Social Connections: Socially Isolated (10/31/2022)   Social Connection and Isolation Panel    Frequency of Communication with Friends and Family: Three times a week    Frequency of Social Gatherings with Friends and Family: Three times a week    Attends Religious Services: Never    Active Member of Clubs or Organizations: No    Attends Banker Meetings: Never  Marital Status: Divorced    Objective:  BP 124/62   Pulse 76   Temp 97.8 F (36.6 C)   Ht 4' 10 (1.473 m)   Wt 94 lb 9.6 oz (42.9 kg)   SpO2 98%   BMI 19.77 kg/m      07/03/2023   10:29 AM 06/13/2023    9:44 AM 05/30/2023   10:11 AM  BP/Weight  Systolic BP 124 122 136  Diastolic BP 62 64 80  Wt. (Lbs) 94.6 91.6 91.2  BMI 19.77 kg/m2 19.14 kg/m2 19.06 kg/m2    Physical Exam Vitals reviewed.  Constitutional:      Appearance: Normal appearance. She is normal weight.  HENT:     Mouth/Throat:     Comments: Hoarse. Neck:     Vascular: No carotid bruit.  Cardiovascular:     Rate and Rhythm: Normal rate and regular rhythm.     Heart sounds: Normal heart sounds.  Pulmonary:     Effort: Pulmonary effort is normal. No respiratory distress.     Breath sounds: Normal breath  sounds.  Abdominal:     General: Abdomen is flat. Bowel sounds are normal.     Palpations: Abdomen is soft.     Tenderness: There is no abdominal tenderness.  Neurological:     Mental Status: She is alert and oriented to person, place, and time.  Psychiatric:        Mood and Affect: Mood normal.        Behavior: Behavior normal.         Lab Results  Component Value Date   WBC 13.0 (H) 06/13/2023   HGB 13.3 06/13/2023   HCT 41.6 06/13/2023   PLT 351 06/13/2023   GLUCOSE 91 06/13/2023   CHOL 104 03/27/2023   TRIG 102 03/27/2023   HDL 41 03/27/2023   LDLDIRECT 151.9 09/13/2011   LDLCALC 44 03/27/2023   ALT 8 06/13/2023   AST 11 06/13/2023   NA 135 06/13/2023   K 3.8 06/13/2023   CL 96 06/13/2023   CREATININE 0.51 (L) 06/13/2023   BUN 13 06/13/2023   CO2 25 06/13/2023   TSH 1.980 06/13/2023   INR 1.1 07/29/2022   HGBA1C 5.1 03/27/2023      Assessment & Plan:  Hypertension associated with diabetes [facility]) Assessment & Plan: Diabetes and hypertension are well-controlled.  Continue chlorthalidone  and carvedilol  as prescribed.   Chronic obstructive pulmonary disease, unspecified COPD type (HCC) Assessment & Plan: Progressing.  Recommended smoking cessation. Refused.  Continue trelegy one inhalation daily.  Use albuterol  2 puffs four times a day as needed.     Hypothyroidism (acquired) Assessment & Plan: Previously well controlled Continue Synthroid  at current dose     Mixed hyperlipidemia Assessment & Plan: At goal. Continue Atorvastatin  20 mg daily.  Continue to work on eating a healthy diet and exercise.     Acute cystitis with hematuria Assessment & Plan: Repeat UA normal.  Resolved.   Orders: -     POCT URINALYSIS DIP (CLINITEK)  Age-related osteoporosis with current pathological fracture with routine healing, subsequent encounter Assessment & Plan: Prolia  given today.   Orders: -     Denosumab      Meds ordered this encounter   Medications   denosumab  (PROLIA ) injection 60 mg    Patient is enrolled in REMS program for this medication and I have provided a copy of the Prolia  Medication Guide and Patient Brochure.:   No    I have reviewed with the patient  the information in the Prolia  Medication Guide and Patient Counseling Chart including the serious risks of Prolia  and symptoms of each risk.:   Yes    I have advised the patient to seek medical attention if they have signs or symptoms of any of the serious risks.:   Yes    Orders Placed This Encounter  Procedures   POCT URINALYSIS DIP (CLINITEK)     Follow-up: Return in about 4 months (around 11/03/2023) for chronic follow up.   I,Marla I Leal-Borjas,acting as a scribe for Abigail Free, MD.,have documented all relevant documentation on the behalf of Abigail Free, MD,as directed by  Abigail Free, MD while in the presence of Abigail Free, MD.   An After Visit Summary was printed and given to the patient.  I attest that I have reviewed this visit and agree with the plan scribed by my staff.   Abigail Free, MD [facility] [phone]

## 2023-07-03 ENCOUNTER — Encounter: Payer: Self-pay | Admitting: Family Medicine

## 2023-07-03 ENCOUNTER — Ambulatory Visit: Admitting: Family Medicine

## 2023-07-03 VITALS — BP 124/62 | HR 76 | Temp 97.8°F | Ht <= 58 in | Wt 94.6 lb

## 2023-07-03 DIAGNOSIS — E039 Hypothyroidism, unspecified: Secondary | ICD-10-CM

## 2023-07-03 DIAGNOSIS — E1159 Type 2 diabetes mellitus with other circulatory complications: Secondary | ICD-10-CM | POA: Diagnosis not present

## 2023-07-03 DIAGNOSIS — I152 Hypertension secondary to endocrine disorders: Secondary | ICD-10-CM

## 2023-07-03 DIAGNOSIS — M8000XD Age-related osteoporosis with current pathological fracture, unspecified site, subsequent encounter for fracture with routine healing: Secondary | ICD-10-CM | POA: Diagnosis not present

## 2023-07-03 DIAGNOSIS — N3001 Acute cystitis with hematuria: Secondary | ICD-10-CM

## 2023-07-03 DIAGNOSIS — E782 Mixed hyperlipidemia: Secondary | ICD-10-CM | POA: Diagnosis not present

## 2023-07-03 DIAGNOSIS — J449 Chronic obstructive pulmonary disease, unspecified: Secondary | ICD-10-CM | POA: Diagnosis not present

## 2023-07-03 LAB — POCT URINALYSIS DIP (CLINITEK)
Bilirubin, UA: NEGATIVE
Blood, UA: NEGATIVE
Glucose, UA: NEGATIVE mg/dL
Ketones, POC UA: NEGATIVE mg/dL
Leukocytes, UA: NEGATIVE
Nitrite, UA: NEGATIVE
POC PROTEIN,UA: NEGATIVE
Spec Grav, UA: 1.02 (ref 1.010–1.025)
Urobilinogen, UA: 0.2 U/dL
pH, UA: 6 (ref 5.0–8.0)

## 2023-07-03 MED ORDER — DENOSUMAB 60 MG/ML ~~LOC~~ SOSY: 60.0000 mg | PREFILLED_SYRINGE | Freq: Once | SUBCUTANEOUS | Status: AC

## 2023-07-03 NOTE — Assessment & Plan Note (Signed)
 Previously well controlled Continue Synthroid at current dose

## 2023-07-04 ENCOUNTER — Other Ambulatory Visit: Payer: Self-pay | Admitting: Family Medicine

## 2023-07-04 DIAGNOSIS — F411 Generalized anxiety disorder: Secondary | ICD-10-CM

## 2023-07-06 NOTE — Assessment & Plan Note (Signed)
 Repeat UA normal.  Resolved.

## 2023-07-06 NOTE — Assessment & Plan Note (Signed)
 At goal. Continue Atorvastatin  20 mg daily.  Continue to work on eating a healthy diet and exercise.

## 2023-07-06 NOTE — Assessment & Plan Note (Signed)
 Diabetes and hypertension are well-controlled.  Continue chlorthalidone  and carvedilol  as prescribed.

## 2023-07-06 NOTE — Assessment & Plan Note (Signed)
 Progressing.  Recommended smoking cessation. Refused.  Continue trelegy one inhalation daily.  Use albuterol  2 puffs four times a day as needed.

## 2023-07-06 NOTE — Assessment & Plan Note (Signed)
Prolia given today. 

## 2023-07-13 ENCOUNTER — Other Ambulatory Visit: Payer: Self-pay | Admitting: Family Medicine

## 2023-07-29 DIAGNOSIS — M545 Low back pain, unspecified: Secondary | ICD-10-CM | POA: Diagnosis not present

## 2023-07-29 DIAGNOSIS — Z1330 Encounter for screening examination for mental health and behavioral disorders, unspecified: Secondary | ICD-10-CM | POA: Diagnosis not present

## 2023-07-29 DIAGNOSIS — M810 Age-related osteoporosis without current pathological fracture: Secondary | ICD-10-CM | POA: Diagnosis not present

## 2023-07-29 DIAGNOSIS — G894 Chronic pain syndrome: Secondary | ICD-10-CM | POA: Diagnosis not present

## 2023-07-31 ENCOUNTER — Ambulatory Visit (INDEPENDENT_AMBULATORY_CARE_PROVIDER_SITE_OTHER)
Admission: RE | Admit: 2023-07-31 | Discharge: 2023-07-31 | Disposition: A | Discharge status: Home / Self Care | Source: Ambulatory Visit | Attending: Emergency Medicine | Admitting: Emergency Medicine

## 2023-07-31 ENCOUNTER — Other Ambulatory Visit: Payer: Self-pay | Admitting: Family Medicine

## 2023-07-31 DIAGNOSIS — R918 Other nonspecific abnormal finding of lung field: Secondary | ICD-10-CM

## 2023-07-31 DIAGNOSIS — I700 Atherosclerosis of aorta: Secondary | ICD-10-CM | POA: Diagnosis not present

## 2023-07-31 DIAGNOSIS — J439 Emphysema, unspecified: Secondary | ICD-10-CM | POA: Diagnosis not present

## 2023-08-01 DIAGNOSIS — H04123 Dry eye syndrome of bilateral lacrimal glands: Secondary | ICD-10-CM | POA: Diagnosis not present

## 2023-08-06 ENCOUNTER — Ambulatory Visit: Payer: Self-pay | Admitting: *Deleted

## 2023-08-06 NOTE — Telephone Encounter (Signed)
 FYI Only or Action Required?: FYI only for provider.  Patient was last seen in primary care on 07/03/2023 by Sherre Clapper, MD.  Called Nurse Triage reporting Fatigue (Fatigue, urinary symptoms).  Symptoms began a week ago.  Interventions attempted: Rest, hydration, or home remedies.  Symptoms are: gradually worsening.  Triage Disposition: See PCP Within 2 Weeks  Patient/caregiver understands and will follow disposition?: yes     Reason for Disposition  Urination is difficult to start (i.e., hesitancy) or straining  Answer Assessment - Initial Assessment Questions 1. SYMPTOM: What's the main symptom you're concerned about? (e.g., frequency, incontinence)     Fatigue, hesitation with urinary stream 2. ONSET: When did the  urinary symptoms  start?     Last week 3. PAIN: Is there any pain? If Yes, ask: How bad is it? (Scale: 1-10; mild, moderate, severe)     Lower back pain- hx chronic back pain 4. CAUSE: What do you think is causing the symptoms?     Same symptoms as UTI in the past 5. OTHER SYMPTOMS: Do you have any other symptoms? (e.g., blood in urine, fever, flank pain, pain with urination)     no  Protocols used: Urinary Symptoms-A-AH    Copied from CRM #[id number]. Topic: Clinical - Red Word Triage >> Aug 06, 2023  1:41 PM Sasha H wrote: Red Word that prompted transfer to Nurse Triage: Pt states she is feeling really bad and run down and having issues with urinating, where it is very slow to come. States she felt like this in June and has a bladder infection

## 2023-08-07 ENCOUNTER — Ambulatory Visit (INDEPENDENT_AMBULATORY_CARE_PROVIDER_SITE_OTHER): Admitting: Family Medicine

## 2023-08-07 ENCOUNTER — Encounter: Payer: Self-pay | Admitting: Family Medicine

## 2023-08-07 VITALS — BP 102/60 | HR 70 | Temp 98.2°F | Ht <= 58 in | Wt 95.0 lb

## 2023-08-07 DIAGNOSIS — R5383 Other fatigue: Secondary | ICD-10-CM

## 2023-08-07 DIAGNOSIS — I10 Essential (primary) hypertension: Secondary | ICD-10-CM | POA: Diagnosis not present

## 2023-08-07 DIAGNOSIS — N3000 Acute cystitis without hematuria: Secondary | ICD-10-CM | POA: Diagnosis not present

## 2023-08-07 LAB — POCT URINALYSIS DIP (CLINITEK)
Blood, UA: NEGATIVE
Glucose, UA: NEGATIVE mg/dL
Ketones, POC UA: NEGATIVE mg/dL
Nitrite, UA: NEGATIVE
POC PROTEIN,UA: 30 — AB
Spec Grav, UA: 1.01 (ref 1.010–1.025)
Urobilinogen, UA: 0.2 U/dL
pH, UA: 7 (ref 5.0–8.0)

## 2023-08-07 MED ORDER — CIPROFLOXACIN HCL 250 MG PO TABS
250.0000 mg | ORAL_TABLET | Freq: Two times a day (BID) | ORAL | 0 refills | Status: AC
Start: 2023-08-07 — End: 2023-08-14

## 2023-08-07 NOTE — Progress Notes (Signed)
 Acute Office Visit  Subjective:    Patient ID: Allison Jimenez, female    DOB: 06/15/1950, 73 y.o.   MRN: [id number]  Chief Complaint  Patient presents with   Urinary Frequency    HPI: Patient is in today for Urinary tract infection symptoms denies dysuria but has some lower back pain, urinary hesitation x 4-5 days ago.  Discussed the use of AI scribe software for clinical note transcription with the patient, who gave verbal consent to proceed.  History of Present Illness   Allison Jimenez is a 73 year old female who presents with fatigue and sleep disturbances.  Fatigue and sleep disturbance - Extreme fatigue and low energy levels despite sleeping extensively - Erratic sleep with frequent awakenings every couple of hours - Sleepiness attributed in part to daily pain medication - Sleep further disrupted by loud tinnitus in one ear, often waking her during vivid dreams - No issues with eating or drinking  Low back pain and mobility impairment - Significant persistent low back pain associated with previous fractures - History of kyphoplasty for two vertebral fractures - Pain affects ability to stand up straight and walk without discomfort - Uses a heating pad to relieve pressure when lying down - Occasionally uses a walker; able to walk short distances without it, though this causes pain  Tinnitus - Loud tinnitus in one ear, often waking her from sleep - Does not use background noise to mask tinnitus  Allergic rhinitis and sinus congestion - Occasional use of sinus medication for congestion and allergies - Congestion particularly affects the left eye - Allergic to cats and has cats in the home  Gastrointestinal symptoms - Mild constipation - Acknowledges need to increase water  intake - No diarrhea or vomiting  Urinary symptoms - History of slow urination - Provided a urine sample during the visit  Constitutional and other symptoms - No fevers or seizures - Breathing  is generally okay       Past Medical History:  Diagnosis Date   Acute blood loss anemia (ABLA) 09/05/2020   Allergy    ANXIETY 06/05/2006   Arthritis    SHOULDERS    BACK PAIN 01/05/2010   Carotid artery occlusion    Cataract    bilateral, left worse   Centrilobular emphysema (HCC)    COPD (chronic obstructive pulmonary disease) (HCC)    Depression    DIVERTICULOSIS, COLON 06/05/2006   DIZZINESS OR VERTIGO 06/05/2006   positional   GERD (gastroesophageal reflux disease)    Headache    Migraines   Hearing loss in left ear    no hearing aid   History of blood transfusion 08/2020   History of kidney stones    passed stones   HYPERLIPIDEMIA 06/05/2006   Hypertension    HYPOKALEMIA 12/14/2008   Hypothyroidism    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   Qualifier: Diagnosis of  By: Jame  MD, Maude FALCON    Pneumonia    Pre-diabetes    TOBACCO USER 11/25/2007    Past Surgical History:  Procedure Laterality Date   APPENDECTOMY     bilateral cataract surgery      BRONCHIAL BIOPSY  09/28/2021   Procedure: BRONCHIAL BIOPSIES;  Surgeon: Gladis Leonor HERO, MD;  Location: [facility] ENDOSCOPY;  Service: Pulmonary;;   BRONCHIAL NEEDLE ASPIRATION BIOPSY  09/28/2021   Procedure: BRONCHIAL NEEDLE ASPIRATION BIOPSIES;  Surgeon: Gladis Leonor HERO, MD;  Location: [facility] ENDOSCOPY;  Service: Pulmonary;;   BRONCHIAL WASHINGS  09/28/2021   Procedure: BRONCHIAL  WASHINGS;  Surgeon: Gladis Leonor HERO, MD;  Location: [facility] ENDOSCOPY;  Service: Pulmonary;;   CESAREAN SECTION     x1   CHOLECYSTECTOMY     COLONOSCOPY     COSMETIC SURGERY     CYSTOSCOPY W/ URETERAL STENT PLACEMENT Right 07/29/2022   Procedure: CYSTOSCOPY WITH RETROGRADE PYELOGRAM/URETERAL STENT PLACEMENT;  Surgeon: Sherrilee Belvie CROME, MD;  Location: [facility] ORS;  Service: Urology;  Laterality: Right;   CYSTOSCOPY/URETEROSCOPY/HOLMIUM LASER/STENT PLACEMENT Right 09/05/2022   Procedure: RIGHT URETEROSCOPY/HOLMIUM LASER/STENT PLACEMENT;  Surgeon:  Lovie Arlyss CROME, MD;  Location: [facility];  Service: Urology;  Laterality: Right;   mastoid surgery  1992   shunt in mastoid- endolymphatic sac in left ear    MRI  07/11/2020   x several, last one located in CE   REVERSE SHOULDER ARTHROPLASTY Right 01/24/2023   Procedure: REVERSE SHOULDER ARTHROPLASTY;  Surgeon: Melita Drivers, MD;  Location: [facility] ORS;  Service: Orthopedics;  Laterality: Right;  120 min   TUBAL LIGATION     UPPER GI ENDOSCOPY     vocal cord surgery     nodule x2    Family History  Problem Relation Age of Onset   Breast cancer Mother    Dementia Mother    Heart disease Mother    Hypertension Mother    Thyroid  disease Mother    Alzheimer's disease Father    Heart disease Father    Hypertension Father    Thyroid  disease Father    Diabetes Sister    Colon cancer Neg Hx    Rectal cancer Neg Hx    Stomach cancer Neg Hx    Colon polyps Neg Hx    Esophageal cancer Neg Hx     Social History   Socioeconomic History   Marital status: Divorced    Spouse name: Not on file   Number of children: 1   Years of education: 13   Highest education level: Not on file  Occupational History   Occupation: ASSISTANT CREDIT MANAGER    Employer: [employer]    Comment: Retired  Tobacco Use   Smoking status: Every Day    Current packs/day: 0.50    Average packs/day: 0.5 packs/day for 49.0 years (24.5 ttl pk-yrs)    Types: Cigarettes   Smokeless tobacco: Never   Tobacco comments:    3/4 ppd 08/30/21 Sonny Lecher, CMA  Vaping Use   Vaping status: Former   Start date: 01/01/2010   Quit date: 08/01/2021   Substances: Nicotine    Devices: menthol/fruit flavor  Substance and Sexual Activity   Alcohol  use: No    Alcohol /week: 0.0 standard drinks of alcohol    Drug use: No   Sexual activity: Not Currently    Birth control/protection: Post-menopausal  Other Topics Concern   Not on file  Social History Narrative   Patient lives at home alone. Patient has a Garment/textile technologist. Patient is divorced .   Patient is retired.   Education high school.   Right handed.   Caffeine none.   Social Drivers of Corporate investment banker Strain: Low Risk  (05/23/2023)   Received from [facility]   Overall Financial Resource Strain (CARDIA)    Difficulty of Paying Living Expenses: Not hard at all  Food Insecurity: No Food Insecurity (05/23/2023)   Received from [facility]   Hunger Vital Sign    Within the past 12 months, you worried that your food would run out before you got the money to buy more.:  Never true    Within the past 12 months, the food you bought just didn't last and you didn't have money to get more.: Never true  Transportation Needs: No Transportation Needs (05/23/2023)   Received from [facility]   PRAPARE - Transportation    Lack of Transportation (Medical): No    Lack of Transportation (Non-Medical): No  Physical Activity: Inactive (10/31/2022)   Exercise Vital Sign    Days of Exercise per Week: 0 days    Minutes of Exercise per Session: 0 min  Stress: No Stress Concern Present (10/31/2022)   [employer] of Occupational Health - Occupational Stress Questionnaire    Feeling of Stress : Not at all  Social Connections: Socially Isolated (10/31/2022)   Social Connection and Isolation Panel    Frequency of Communication with Friends and Family: Three times a week    Frequency of Social Gatherings with Friends and Family: Three times a week    Attends Religious Services: Never    Active Member of Clubs or Organizations: No    Attends Banker Meetings: Never    Marital Status: Divorced  Catering manager Violence: Not At Risk (07/03/2023)   Humiliation, Afraid, Rape, and Kick questionnaire    Fear of Current or Ex-Partner: No    Emotionally Abused: No    Physically Abused: No    Sexually Abused: No    Outpatient Medications Prior to Visit  Medication Sig Dispense Refill   acetaminophen  (TYLENOL ) 500 MG tablet Take  500-1,000 mg by mouth every 6 (six) hours as needed for mild pain, moderate pain or headache.     albuterol  (VENTOLIN  HFA) 108 (90 Base) MCG/ACT inhaler Inhale 2 puffs into the lungs every 6 (six) hours as needed for shortness of breath or wheezing. 8 g 2   atorvastatin  (LIPITOR) 20 MG tablet TAKE 1 TABLET(20 MG) BY MOUTH DAILY 90 tablet 1   calcitonin, salmon, (MIACALCIN /FORTICAL) 200 UNIT/ACT nasal spray Place 1 spray into alternate nostrils daily.     Carboxymethylcellul-Glycerin (LUBRICATING EYE DROPS OP) Place 1 drop into both eyes daily.     carvedilol  (COREG ) 25 MG tablet TAKE 1 TABLET(25 MG) BY MOUTH TWICE DAILY WITH A MEAL 180 tablet 0   cetirizine (ZYRTEC) 10 MG tablet Take 10 mg by mouth daily as needed for allergies.     chlorthalidone  (HYGROTON ) 25 MG tablet TAKE 1 TABLET(25 MG) BY MOUTH DAILY 90 tablet 1   Cholecalciferol (VITAMIN D ) 50 MCG (2000 UT) CAPS Take 2,000 Units by mouth daily.     conjugated estrogens  (PREMARIN ) vaginal cream Place 1 applicator vaginally daily as needed (dryness / burning).     cyclobenzaprine  (FLEXERIL ) 10 MG tablet Take 1 tablet (10 mg total) by mouth 3 (three) times daily as needed for muscle spasms. 30 tablet 1   diclofenac  Sodium (VOLTAREN ) 1 % GEL Apply 1 Application topically 4 (four) times daily as needed (pain).     EMGALITY 120 MG/ML SOAJ      famotidine  (PEPCID ) 40 MG tablet TAKE 1 TABLET(40 MG) BY MOUTH AT BEDTIME 90 tablet 1   fluorometholone (FML) 0.1 % ophthalmic suspension INSTILL 1 DROP INTO LEFT EYE THREE TIMES DAILY FOR 3 DAYS THEN TWICE DAILY FOR 3 DAYS THEN EVERY DAY FOR 3 DAYS THEN STOP     FLUoxetine  (PROZAC ) 20 MG capsule TAKE 3 CAPSULES(60 MG) BY MOUTH DAILY 270 capsule 0   fluticasone  (FLONASE ) 50 MCG/ACT nasal spray Place 2 sprays into both nostrils daily. SHAKE LIQUID AND USE  2 SPRAYS IN EACH NOSTRIL DAILY 16 g 6   Fluticasone -Umeclidin-Vilant (TRELEGY ELLIPTA ) 100-62.5-25 MCG/ACT AEPB INHALE 1 PUFF INTO THE LUNGS DAILY 180 each 3    ketoconazole  (NIZORAL ) 2 % shampoo Apply 1 Application topically 2 (two) times a week. 120 mL 0   levothyroxine  (SYNTHROID ) 25 MCG tablet TAKE 1 TABLET(25 MCG) BY MOUTH DAILY 90 tablet 0   montelukast  (SINGULAIR ) 10 MG tablet TAKE 1 TABLET(10 MG) BY MOUTH AT BEDTIME 90 tablet 3   nystatin -triamcinolone  ointment (MYCOLOG) Apply 1 Application topically 2 (two) times daily. 30 g 0   ondansetron  (ZOFRAN ) 4 MG tablet Take 1 tablet (4 mg total) by mouth every 8 (eight) hours as needed for nausea or vomiting. 10 tablet 0   OVER THE COUNTER MEDICATION Take 1 tablet by mouth in the morning. Salt Supplement     oxyCODONE -acetaminophen  (PERCOCET) 10-325 MG tablet Take 1 tablet by mouth every 8 (eight) hours as needed for pain.     pantoprazole  (PROTONIX ) 40 MG tablet TAKE 1 TABLET(40 MG) BY MOUTH DAILY 90 tablet 0   polyethylene glycol (MIRALAX  / GLYCOLAX ) 17 g packet Take 17 g by mouth daily.     potassium chloride  (MICRO-K ) 10 MEQ CR capsule TAKE 2 CAPSULES(20 MEQ) BY MOUTH TWICE DAILY 360 capsule 1   pregabalin  (LYRICA ) 25 MG capsule Take 1 capsule (25 mg total) by mouth at bedtime. 90 capsule 0   promethazine  (PHENERGAN ) 25 MG tablet TAKE 1 TABLET BY MOUTH EVERY 8 HOURS IF NEEDED 20 tablet 0   rizatriptan  (MAXALT ) 10 MG tablet Take 10 mg by mouth as needed for migraine.     vitamin B-12 (CYANOCOBALAMIN ) 500 MCG tablet Take 500 mcg by mouth daily at 12 noon.     Facility-Administered Medications Prior to Visit  Medication Dose Route Frequency Provider Last Rate Last Admin   [START ON 01/06/2024] denosumab  (PROLIA ) injection 60 mg  60 mg Subcutaneous Once         Allergies  Allergen Reactions   Erythromycin Base Diarrhea   Vfend  [Voriconazole ] Other (See Comments)    Visual changes   Penicillins Rash    Review of Systems  Constitutional:  Positive for fatigue. Negative for chills and fever.  HENT:  Positive for tinnitus. Negative for congestion, ear pain and sore throat.   Respiratory:   Negative for cough and shortness of breath.   Cardiovascular:  Negative for chest pain.  Gastrointestinal:  Positive for constipation (mild). Negative for abdominal pain, diarrhea, nausea and vomiting.  Genitourinary:  Negative for dysuria and urgency.  Musculoskeletal:  Positive for arthralgias and back pain. Negative for myalgias.  Skin:  Negative for rash.  Neurological:  Negative for dizziness and headaches.  Psychiatric/Behavioral:  Positive for sleep disturbance. Negative for dysphoric mood. The patient is not nervous/anxious.        Objective:        08/07/2023   10:44 AM 07/03/2023   10:29 AM 06/13/2023    9:44 AM  Vitals with BMI  Height 4' 10 4' 10 4' 10  Weight 95 lbs 94 lbs 10 oz 91 lbs 10 oz  BMI 19.86 19.78 19.15  Systolic 102 124 877  Diastolic 60 62 64  Pulse 70 76 83    No data found.   Physical Exam Vitals reviewed.  Constitutional:      Appearance: Normal appearance.     Comments: thin  Neck:     Vascular: No carotid bruit.  Cardiovascular:     Rate and  Rhythm: Normal rate and regular rhythm.     Heart sounds: Normal heart sounds.  Pulmonary:     Effort: Pulmonary effort is normal. No respiratory distress.     Breath sounds: Normal breath sounds.  Abdominal:     General: Abdomen is flat. Bowel sounds are normal.     Palpations: Abdomen is soft.     Tenderness: There is no abdominal tenderness.  Musculoskeletal:        General: Tenderness (lumbar back) present.  Neurological:     Mental Status: She is alert and oriented to person, place, and time.  Psychiatric:        Mood and Affect: Mood normal.        Behavior: Behavior normal.     Health Maintenance Due  Topic Date Due   DTaP/Tdap/Td (2 - Td or Tdap) 05/17/2020   Diabetic kidney evaluation - Urine ACR  01/26/2023   COVID-19 Vaccine (7 - Pfizer risk 2024-25 season) 03/17/2023    There are no preventive care reminders to display for this patient.   Lab Results  Component Value Date    TSH 1.980 06/13/2023   Lab Results  Component Value Date   WBC 13.0 (H) 06/13/2023   HGB 13.3 06/13/2023   HCT 41.6 06/13/2023   MCV 94 06/13/2023   PLT 351 06/13/2023   Lab Results  Component Value Date   NA 135 06/13/2023   K 3.8 06/13/2023   CO2 25 06/13/2023   GLUCOSE 91 06/13/2023   BUN 13 06/13/2023   CREATININE 0.51 (L) 06/13/2023   BILITOT 0.2 06/13/2023   ALKPHOS 98 06/13/2023   AST 11 06/13/2023   ALT 8 06/13/2023   PROT 6.6 06/13/2023   ALBUMIN 4.1 06/13/2023   CALCIUM  9.9 06/13/2023   ANIONGAP 4 (L) 01/14/2023   EGFR 100 06/13/2023   GFR 75.48 07/09/2016   Lab Results  Component Value Date   CHOL 104 03/27/2023   Lab Results  Component Value Date   HDL 41 03/27/2023   Lab Results  Component Value Date   LDLCALC 44 03/27/2023   Lab Results  Component Value Date   TRIG 102 03/27/2023   Lab Results  Component Value Date   CHOLHDL 2.5 03/27/2023   Lab Results  Component Value Date   HGBA1C 5.1 03/27/2023       Assessment & Plan:  Acute cystitis without hematuria Assessment & Plan: Check UA Start on cipro  250 mg twice daily x 7 days.  Ordering urine culture.  Orders: -     POCT URINALYSIS DIP (CLINITEK) -     Urine Culture -     Ciprofloxacin  HCl; Take 1 tablet (250 mg total) by mouth 2 (two) times daily for 7 days.  Dispense: 14 tablet; Refill: 0  Essential hypertension Assessment & Plan: Continue on  chlorthalidone  25 mg daily. Decrease carvedilol  25 mg 1/2 pill twice daily  Continue to work on eating a healthy diet and exercise.     Other fatigue Assessment & Plan: Decrease carvedilol  25 mg 1/2 pill twice daily in case low bp is causing fatigue.       Meds ordered this encounter  Medications   ciprofloxacin  (CIPRO ) 250 MG tablet    Sig: Take 1 tablet (250 mg total) by mouth 2 (two) times daily for 7 days.    Dispense:  14 tablet    Refill:  0    Orders Placed This Encounter  Procedures   Urine Culture   POCT  URINALYSIS DIP (CLINITEK)     Follow-up: Return in about 2 weeks (around 08/21/2023) for chronic follow up, Dina - BP visit/Recheck Urine. .  An After Visit Summary was printed and given to the patient.  Abigail Free, MD [facility] [phone]

## 2023-08-07 NOTE — Patient Instructions (Addendum)
 Decrease carvedilol  25 mg 1/2 pill twice daily   Urinary tract infection: start on cipro  250 mg twice daily x 7 days.

## 2023-08-08 ENCOUNTER — Ambulatory Visit: Payer: Self-pay | Admitting: Family Medicine

## 2023-08-09 DIAGNOSIS — H04122 Dry eye syndrome of left lacrimal gland: Secondary | ICD-10-CM | POA: Diagnosis not present

## 2023-08-09 LAB — URINE CULTURE

## 2023-08-10 NOTE — Assessment & Plan Note (Signed)
 Decrease carvedilol  25 mg 1/2 pill twice daily in case low bp is causing fatigue.

## 2023-08-10 NOTE — Assessment & Plan Note (Signed)
 Continue on  chlorthalidone  25 mg daily. Decrease carvedilol  25 mg 1/2 pill twice daily  Continue to work on eating a healthy diet and exercise.

## 2023-08-10 NOTE — Assessment & Plan Note (Signed)
 Check UA Start on cipro  250 mg twice daily x 7 days.  Ordering urine culture.

## 2023-08-12 ENCOUNTER — Other Ambulatory Visit: Payer: Self-pay | Admitting: Family Medicine

## 2023-08-13 ENCOUNTER — Ambulatory Visit: Payer: Self-pay | Admitting: Emergency Medicine

## 2023-08-14 NOTE — Progress Notes (Signed)
 Called and spoke with pt. Informed her of her CT results. Pt verbalized understanding. Set up appt for 09/18/2023 @ 3pm as she was supposed to follow up with an appt in August and no appt was scheduled. NFN

## 2023-08-15 DIAGNOSIS — H04123 Dry eye syndrome of bilateral lacrimal glands: Secondary | ICD-10-CM | POA: Diagnosis not present

## 2023-08-15 DIAGNOSIS — Z961 Presence of intraocular lens: Secondary | ICD-10-CM | POA: Diagnosis not present

## 2023-08-21 ENCOUNTER — Ambulatory Visit (INDEPENDENT_AMBULATORY_CARE_PROVIDER_SITE_OTHER): Admitting: Family Medicine

## 2023-08-21 VITALS — BP 122/74 | HR 76 | Temp 98.2°F | Ht <= 58 in | Wt 94.0 lb

## 2023-08-21 DIAGNOSIS — R42 Dizziness and giddiness: Secondary | ICD-10-CM | POA: Diagnosis not present

## 2023-08-21 DIAGNOSIS — N3000 Acute cystitis without hematuria: Secondary | ICD-10-CM

## 2023-08-21 DIAGNOSIS — M545 Low back pain, unspecified: Secondary | ICD-10-CM | POA: Diagnosis not present

## 2023-08-21 DIAGNOSIS — G8929 Other chronic pain: Secondary | ICD-10-CM

## 2023-08-21 LAB — POCT URINALYSIS DIP (CLINITEK)
Bilirubin, UA: NEGATIVE
Blood, UA: NEGATIVE
Glucose, UA: NEGATIVE mg/dL
Ketones, POC UA: NEGATIVE mg/dL
Leukocytes, UA: NEGATIVE
Nitrite, UA: NEGATIVE
POC PROTEIN,UA: NEGATIVE
Spec Grav, UA: 1.015 (ref 1.010–1.025)
Urobilinogen, UA: 0.2 U/dL
pH, UA: 6 (ref 5.0–8.0)

## 2023-08-21 NOTE — Patient Instructions (Signed)
 Sent meclizine.  Checking labs.  Push fluids.

## 2023-08-21 NOTE — Progress Notes (Unsigned)
 Acute Office Visit  Subjective:    Patient ID: Allison Jimenez, female    DOB: 11/08/1950, 73 y.o.   MRN: [id number]  Chief Complaint  Patient presents with   Urinary Tract Infection    HPI: Patient is in today for f/u UTI symptoms states her back pain is worse and doesn't feel right. Urine culture came back mixed urogenital flora. 25K-50K CFUs.   Vertigo started Sunday. Constant tinnitus in left ear.  No falls. Spinning. No light headedness. No chest pain. No shortness of breath.   Past Medical History:  Diagnosis Date   Acute blood loss anemia (ABLA) 09/05/2020   Allergy    ANXIETY 06/05/2006   Arthritis    SHOULDERS    BACK PAIN 01/05/2010   Carotid artery occlusion    Cataract    bilateral, left worse   Centrilobular emphysema (HCC)    COPD (chronic obstructive pulmonary disease) (HCC)    Depression    DIVERTICULOSIS, COLON 06/05/2006   DIZZINESS OR VERTIGO 06/05/2006   positional   GERD (gastroesophageal reflux disease)    Headache    Migraines   Hearing loss in left ear    no hearing aid   History of blood transfusion 08/2020   History of kidney stones    passed stones   HYPERLIPIDEMIA 06/05/2006   Hypertension    HYPOKALEMIA 12/14/2008   Hypothyroidism    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   Qualifier: Diagnosis of  By: Jame  MD, Maude FALCON    Pneumonia    Pre-diabetes    TOBACCO USER 11/25/2007    Past Surgical History:  Procedure Laterality Date   APPENDECTOMY     bilateral cataract surgery      BRONCHIAL BIOPSY  09/28/2021   Procedure: BRONCHIAL BIOPSIES;  Surgeon: Gladis Leonor HERO, MD;  Location: [facility] ENDOSCOPY;  Service: Pulmonary;;   BRONCHIAL NEEDLE ASPIRATION BIOPSY  09/28/2021   Procedure: BRONCHIAL NEEDLE ASPIRATION BIOPSIES;  Surgeon: Gladis Leonor HERO, MD;  Location: [facility] ENDOSCOPY;  Service: Pulmonary;;   BRONCHIAL WASHINGS  09/28/2021   Procedure: BRONCHIAL WASHINGS;  Surgeon: Gladis Leonor HERO, MD;  Location: [facility] ENDOSCOPY;  Service:  Pulmonary;;   CESAREAN SECTION     x1   CHOLECYSTECTOMY     COLONOSCOPY     COSMETIC SURGERY     CYSTOSCOPY W/ URETERAL STENT PLACEMENT Right 07/29/2022   Procedure: CYSTOSCOPY WITH RETROGRADE PYELOGRAM/URETERAL STENT PLACEMENT;  Surgeon: Sherrilee Dover CROME, MD;  Location: [facility] ORS;  Service: Urology;  Laterality: Right;   CYSTOSCOPY/URETEROSCOPY/HOLMIUM LASER/STENT PLACEMENT Right 09/05/2022   Procedure: RIGHT URETEROSCOPY/HOLMIUM LASER/STENT PLACEMENT;  Surgeon: Lovie Arlyss CROME, MD;  Location: [facility];  Service: Urology;  Laterality: Right;   mastoid surgery  1992   shunt in mastoid- endolymphatic sac in left ear    MRI  07/11/2020   x several, last one located in CE   REVERSE SHOULDER ARTHROPLASTY Right 01/24/2023   Procedure: REVERSE SHOULDER ARTHROPLASTY;  Surgeon: Melita Drivers, MD;  Location: [facility];  Service: Orthopedics;  Laterality: Right;  120 min   TUBAL LIGATION     UPPER GI ENDOSCOPY     vocal cord surgery     nodule x2    Family History  Problem Relation Age of Onset   Breast cancer Mother    Dementia Mother    Heart disease Mother    Hypertension Mother    Thyroid  disease Mother    Alzheimer's disease Father    Heart disease Father    Hypertension Father  Thyroid  disease Father    Diabetes Sister    Colon cancer Neg Hx    Rectal cancer Neg Hx    Stomach cancer Neg Hx    Colon polyps Neg Hx    Esophageal cancer Neg Hx     Social History   Socioeconomic History   Marital status: Divorced    Spouse name: Not on file   Number of children: 1   Years of education: 13   Highest education level: Not on file  Occupational History   Occupation: ASSISTANT CREDIT MANAGER    Employer: [employer]    Comment: Retired  Tobacco Use   Smoking status: Every Day    Current packs/day: 0.50    Average packs/day: 0.5 packs/day for 49.0 years (24.5 ttl pk-yrs)    Types: Cigarettes   Smokeless tobacco: Never   Tobacco comments:    3/4 ppd 08/30/21 Sonny Lecher, CMA  Vaping Use   Vaping status: Former   Start date: 01/01/2010   Quit date: 08/01/2021   Substances: Nicotine    Devices: menthol/fruit flavor  Substance and Sexual Activity   Alcohol  use: No    Alcohol /week: 0.0 standard drinks of alcohol    Drug use: No   Sexual activity: Not Currently    Birth control/protection: Post-menopausal  Other Topics Concern   Not on file  Social History Narrative   Patient lives at home alone. Patient has a Biochemist, clinical. Patient is divorced .   Patient is retired.   Education high school.   Right handed.   Caffeine none.   Social Drivers of Corporate investment banker Strain: Low Risk  (05/23/2023)   Received from [facility]   Overall Financial Resource Strain (CARDIA)    Difficulty of Paying Living Expenses: Not hard at all  Food Insecurity: No Food Insecurity (05/23/2023)   Received from [facility]   Hunger Vital Sign    Within the past 12 months, you worried that your food would run out before you got the money to buy more.: Never true    Within the past 12 months, the food you bought just didn't last and you didn't have money to get more.: Never true  Transportation Needs: No Transportation Needs (05/23/2023)   Received from [facility] - Transportation    Lack of Transportation (Medical): No    Lack of Transportation (Non-Medical): No  Physical Activity: Inactive (10/31/2022)   Exercise Vital Sign    Days of Exercise per Week: 0 days    Minutes of Exercise per Session: 0 min  Stress: No Stress Concern Present (08/21/2023)   [employer] of Occupational Health - Occupational Stress Questionnaire    Feeling of Stress: Not at all  Social Connections: Socially Isolated (08/21/2023)   Social Connection and Isolation Panel    Frequency of Communication with Friends and Family: Three times a week    Frequency of Social Gatherings with Friends and Family: Three times a week    Attends Religious Services: Never     Active Member of Clubs or Organizations: No    Attends Banker Meetings: Never    Marital Status: Divorced  Catering manager Violence: Not At Risk (07/03/2023)   Humiliation, Afraid, Rape, and Kick questionnaire    Fear of Current or Ex-Partner: No    Emotionally Abused: No    Physically Abused: No    Sexually Abused: No    Outpatient Medications Prior to Visit  Medication Sig Dispense  Refill   acetaminophen  (TYLENOL ) 500 MG tablet Take 500-1,000 mg by mouth every 6 (six) hours as needed for mild pain, moderate pain or headache.     albuterol  (VENTOLIN  HFA) 108 (90 Base) MCG/ACT inhaler Inhale 2 puffs into the lungs every 6 (six) hours as needed for shortness of breath or wheezing. 8 g 2   atorvastatin  (LIPITOR) 20 MG tablet TAKE 1 TABLET(20 MG) BY MOUTH DAILY 90 tablet 1   calcitonin, salmon, (MIACALCIN /FORTICAL) 200 UNIT/ACT nasal spray Place 1 spray into alternate nostrils daily.     Carboxymethylcellul-Glycerin (LUBRICATING EYE DROPS OP) Place 1 drop into both eyes daily.     carvedilol  (COREG ) 25 MG tablet TAKE 1 TABLET(25 MG) BY MOUTH TWICE DAILY WITH A MEAL 180 tablet 0   cetirizine (ZYRTEC) 10 MG tablet Take 10 mg by mouth daily as needed for allergies.     chlorthalidone  (HYGROTON ) 25 MG tablet TAKE 1 TABLET(25 MG) BY MOUTH DAILY 90 tablet 1   Cholecalciferol (VITAMIN D ) 50 MCG (2000 UT) CAPS Take 2,000 Units by mouth daily.     conjugated estrogens  (PREMARIN ) vaginal cream Place 1 applicator vaginally daily as needed (dryness / burning).     cyclobenzaprine  (FLEXERIL ) 10 MG tablet Take 1 tablet (10 mg total) by mouth 3 (three) times daily as needed for muscle spasms. 30 tablet 1   diclofenac  Sodium (VOLTAREN ) 1 % GEL Apply 1 Application topically 4 (four) times daily as needed (pain).     EMGALITY 120 MG/ML SOAJ      famotidine  (PEPCID ) 40 MG tablet TAKE 1 TABLET(40 MG) BY MOUTH AT BEDTIME 90 tablet 1   fluorometholone (FML) 0.1 % ophthalmic suspension INSTILL 1  DROP INTO LEFT EYE THREE TIMES DAILY FOR 3 DAYS THEN TWICE DAILY FOR 3 DAYS THEN EVERY DAY FOR 3 DAYS THEN STOP     FLUoxetine  (PROZAC ) 20 MG capsule TAKE 3 CAPSULES(60 MG) BY MOUTH DAILY 270 capsule 0   fluticasone  (FLONASE ) 50 MCG/ACT nasal spray Place 2 sprays into both nostrils daily. SHAKE LIQUID AND USE 2 SPRAYS IN EACH NOSTRIL DAILY 16 g 6   Fluticasone -Umeclidin-Vilant (TRELEGY ELLIPTA ) 100-62.5-25 MCG/ACT AEPB INHALE 1 PUFF INTO THE LUNGS DAILY 180 each 3   ketoconazole  (NIZORAL ) 2 % shampoo Apply 1 Application topically 2 (two) times a week. 120 mL 0   levothyroxine  (SYNTHROID ) 25 MCG tablet TAKE 1 TABLET(25 MCG) BY MOUTH DAILY 90 tablet 0   montelukast  (SINGULAIR ) 10 MG tablet TAKE 1 TABLET(10 MG) BY MOUTH AT BEDTIME 90 tablet 3   nystatin -triamcinolone  ointment (MYCOLOG) Apply 1 Application topically 2 (two) times daily. 30 g 0   ondansetron  (ZOFRAN ) 4 MG tablet Take 1 tablet (4 mg total) by mouth every 8 (eight) hours as needed for nausea or vomiting. 10 tablet 0   OVER THE COUNTER MEDICATION Take 1 tablet by mouth in the morning. Salt Supplement     oxyCODONE -acetaminophen  (PERCOCET) 10-325 MG tablet Take 1 tablet by mouth every 8 (eight) hours as needed for pain.     pantoprazole  (PROTONIX ) 40 MG tablet TAKE 1 TABLET(40 MG) BY MOUTH DAILY 90 tablet 0   polyethylene glycol (MIRALAX  / GLYCOLAX ) 17 g packet Take 17 g by mouth daily.     potassium chloride  (MICRO-K ) 10 MEQ CR capsule TAKE 2 CAPSULES(20 MEQ) BY MOUTH TWICE DAILY 360 capsule 1   pregabalin  (LYRICA ) 25 MG capsule TAKE 1 CAPSULE(25 MG) BY MOUTH AT BEDTIME 90 capsule 0   promethazine  (PHENERGAN ) 25 MG tablet TAKE 1 TABLET  BY MOUTH EVERY 8 HOURS IF NEEDED 20 tablet 0   rizatriptan  (MAXALT ) 10 MG tablet Take 10 mg by mouth as needed for migraine.     vitamin B-12 (CYANOCOBALAMIN ) 500 MCG tablet Take 500 mcg by mouth daily at 12 noon.     Facility-Administered Medications Prior to Visit  Medication Dose Route Frequency  Provider Last Rate Last Admin   [START ON 01/06/2024] denosumab  (PROLIA ) injection 60 mg  60 mg Subcutaneous Once         Allergies  Allergen Reactions   Erythromycin Base Diarrhea   Vfend  [Voriconazole ] Other (See Comments)    Visual changes   Penicillins Rash    Review of Systems  Constitutional:  Positive for fatigue. Negative for chills and fever.  HENT:  Negative for congestion, ear pain, rhinorrhea and sore throat.   Respiratory:  Negative for cough and shortness of breath.   Cardiovascular:  Negative for chest pain.  Gastrointestinal:  Positive for diarrhea and nausea. Negative for abdominal pain, constipation and vomiting.  Genitourinary:  Negative for dysuria and urgency.  Musculoskeletal:  Positive for back pain. Negative for myalgias.  Neurological:  Positive for dizziness (vertigo). Negative for weakness, light-headedness and headaches.  Psychiatric/Behavioral:  Negative for dysphoric mood. The patient is not nervous/anxious.        Objective:        08/27/2023    9:26 AM 08/21/2023   10:11 AM 08/07/2023   10:44 AM  Vitals with BMI  Height 4' 10 4' 10 4' 10  Weight 93 lbs 14 oz 94 lbs 95 lbs  BMI 19.63 19.65 19.86  Systolic 130 122 897  Diastolic 71 74 60  Pulse 61 76 70    No data found.   Physical Exam Vitals reviewed.  Constitutional:      Appearance: Normal appearance. She is normal weight.  Neck:     Vascular: No carotid bruit.  Cardiovascular:     Rate and Rhythm: Normal rate and regular rhythm.     Heart sounds: Normal heart sounds.  Pulmonary:     Effort: Pulmonary effort is normal. No respiratory distress.     Breath sounds: Normal breath sounds.  Abdominal:     General: Abdomen is flat. Bowel sounds are normal.     Palpations: Abdomen is soft.     Tenderness: There is no abdominal tenderness.  Musculoskeletal:        General: Tenderness (lumbar) present.  Neurological:     Mental Status: She is alert and oriented to person, place, and  time.  Psychiatric:        Mood and Affect: Mood normal.        Behavior: Behavior normal.     Health Maintenance Due  Topic Date Due   DTaP/Tdap/Td (2 - Td or Tdap) 05/17/2020   Diabetic kidney evaluation - Urine ACR  01/26/2023   COVID-19 Vaccine (7 - Pfizer risk 2024-25 season) 03/17/2023   Medicare Annual Wellness (AWV)  10/24/2023    There are no preventive care reminders to display for this patient.   Lab Results  Component Value Date   TSH 1.980 06/13/2023   Lab Results  Component Value Date   WBC 8.0 08/21/2023   HGB 12.2 08/21/2023   HCT 38.4 08/21/2023   MCV 94 08/21/2023   PLT 342 08/21/2023   Lab Results  Component Value Date   NA 135 08/21/2023   K 4.4 08/21/2023   CO2 22 08/21/2023   GLUCOSE 92 08/21/2023  BUN 11 08/21/2023   CREATININE 0.66 08/21/2023   BILITOT 0.5 08/21/2023   ALKPHOS 87 08/21/2023   AST 28 08/21/2023   ALT 21 08/21/2023   PROT 6.3 08/21/2023   ALBUMIN 4.0 08/21/2023   CALCIUM  9.3 08/21/2023   ANIONGAP 4 (L) 01/14/2023   EGFR 94 08/21/2023   GFR 75.48 07/09/2016   Lab Results  Component Value Date   CHOL 104 03/27/2023   Lab Results  Component Value Date   HDL 41 03/27/2023   Lab Results  Component Value Date   LDLCALC 44 03/27/2023   Lab Results  Component Value Date   TRIG 102 03/27/2023   Lab Results  Component Value Date   CHOLHDL 2.5 03/27/2023   Lab Results  Component Value Date   HGBA1C 5.1 03/27/2023       Assessment & Plan:  Chronic midline low back pain without sciatica Assessment & Plan: Check UA  Orders: -     POCT URINALYSIS DIP (CLINITEK)  Dizziness Assessment & Plan: Sent meclizine.  Checking labs.  Push fluids.   Orders: -     CBC with Differential/Platelet -     Comprehensive metabolic panel with GFR     No orders of the defined types were placed in this encounter.   Orders Placed This Encounter  Procedures   CBC with Differential/Platelet   Comprehensive metabolic  panel with GFR   POCT URINALYSIS DIP (CLINITEK)     Follow-up: Return if symptoms worsen or fail to improve.  An After Visit Summary was printed and given to the patient.  Allison Jimenez,acting as a scribe for Abigail Free, MD.,have documented all relevant documentation on the behalf of Abigail Free, MD,as directed by  Abigail Free, MD while in the presence of Abigail Free, MD.   Abigail Free, MD [facility] [phone]

## 2023-08-22 LAB — COMPREHENSIVE METABOLIC PANEL WITH GFR
ALT: 21 IU/L (ref 0–32)
AST: 28 IU/L (ref 0–40)
Albumin: 4 g/dL (ref 3.8–4.8)
Alkaline Phosphatase: 87 IU/L (ref 44–121)
BUN/Creatinine Ratio: 17 (ref 12–28)
BUN: 11 mg/dL (ref 8–27)
Bilirubin Total: 0.5 mg/dL (ref 0.0–1.2)
CO2: 22 mmol/L (ref 20–29)
Calcium: 9.3 mg/dL (ref 8.7–10.3)
Chloride: 98 mmol/L (ref 96–106)
Creatinine, Ser: 0.66 mg/dL (ref 0.57–1.00)
Globulin, Total: 2.3 g/dL (ref 1.5–4.5)
Glucose: 92 mg/dL (ref 70–99)
Potassium: 4.4 mmol/L (ref 3.5–5.2)
Sodium: 135 mmol/L (ref 134–144)
Total Protein: 6.3 g/dL (ref 6.0–8.5)
eGFR: 94 mL/min/1.73 (ref >59)

## 2023-08-22 LAB — CBC WITH DIFFERENTIAL/PLATELET
Basophils Absolute: 0.1 x10E3/uL (ref 0.0–0.2)
Basos: 1 %
EOS (ABSOLUTE): 0.4 x10E3/uL (ref 0.0–0.4)
Eos: 6 %
Hematocrit: 38.4 % (ref 34.0–46.6)
Hemoglobin: 12.2 g/dL (ref 11.1–15.9)
Immature Grans (Abs): 0 x10E3/uL (ref 0.0–0.1)
Immature Granulocytes: 0 %
Lymphocytes Absolute: 2.9 x10E3/uL (ref 0.7–3.1)
Lymphs: 36 %
MCH: 29.8 pg (ref 26.6–33.0)
MCHC: 31.8 g/dL (ref 31.5–35.7)
MCV: 94 fL (ref 79–97)
Monocytes Absolute: 0.7 x10E3/uL (ref 0.1–0.9)
Monocytes: 9 %
Neutrophils Absolute: 3.8 x10E3/uL (ref 1.4–7.0)
Neutrophils: 48 %
Platelets: 342 x10E3/uL (ref 150–450)
RBC: 4.09 x10E6/uL (ref 3.77–5.28)
RDW: 12.5 % (ref 11.7–15.4)
WBC: 8 x10E3/uL (ref 3.4–10.8)

## 2023-08-24 NOTE — Assessment & Plan Note (Signed)
 Check labs

## 2023-08-24 NOTE — Assessment & Plan Note (Signed)
 Check UA

## 2023-08-25 ENCOUNTER — Ambulatory Visit: Payer: Self-pay | Admitting: Family Medicine

## 2023-08-26 DIAGNOSIS — G894 Chronic pain syndrome: Secondary | ICD-10-CM | POA: Diagnosis not present

## 2023-08-26 DIAGNOSIS — M546 Pain in thoracic spine: Secondary | ICD-10-CM | POA: Diagnosis not present

## 2023-08-26 DIAGNOSIS — M791 Myalgia, unspecified site: Secondary | ICD-10-CM | POA: Diagnosis not present

## 2023-08-26 DIAGNOSIS — M545 Low back pain, unspecified: Secondary | ICD-10-CM | POA: Diagnosis not present

## 2023-08-26 NOTE — Progress Notes (Unsigned)
 [facility] [place]  ASSESSMENT / PLAN: Allison Jimenez is a 73 y.o. female with a 49mm infrarenal abdominal aortic aneurysm  The patient is not yet a candidate for repair based on size criteria. Her estimated annual rupture risk is 1-5%.   Recommend the following to reduce the risk of major adverse cardiac / limb events.  Complete cessation from all tobacco products. Blood glucose control with goal A1c < 7%. Blood pressure control with goal blood pressure < 140/90 mmHg. Lipid reduction therapy with goal LDL-C <100 mg/dL. Aspirin 81mg  PO QD.  Atorvastatin  40-80mg  PO QD (or other high intensity statin therapy).  Follow up with me in 6 months with AAA duplex.  CHIEF COMPLAINT: AAA growth  HISTORY OF PRESENT ILLNESS: Allison Jimenez is a 73 y.o. female who returns to clinic for discussion of abdominal aortic aneurysm.  This was noted to grow slightly from 3.8 to 4.1 cm on recent serial imaging.  The patient has no symptoms referable to her abdominal aortic aneurysm.  We spent the majority of our visit discussing the natural history abdominal aortic aneurysm disease, the rationale for the threshold of 5 cm for intervention in females, and the rationale for surveillance.  10/23/22: Patient returns to clinic for surveillance of carotid artery stenosis and abdominal aortic aneurysm.  She reports over the past 6 months she has been much less active.  She is frustrated by her inability to be independent and to get up and do things she previously enjoyed.  She is particularly bothered by her right shoulder which has very limited range of motion.  We reviewed her noninvasive test in detail.  05/14/23: Patient returns to clinic for surveillance of carotid artery disease and abdominal aortic aneurysm.  Patient is had multiple health challenges over the past several months.  She suffered a fall with multiple rib fractures.  She was admitted for urosepsis.  She has lost a significant  amount of weight.  We reviewed her duplex in detail.  I counseled her about her being very near the threshold for intervention in a woman.  08/27/23: Patient returns to clinic for surveillance of abdominal aortic aneurysm.  She is doing well overall.  She reports she has gained about 2 pounds.  Her strength is improving slowly.  She continues to smoke.  She has not yet reached threshold for aneurysm repair.  Past Medical History:  Diagnosis Date   Acute blood loss anemia (ABLA) 09/05/2020   Allergy    ANXIETY 06/05/2006   Arthritis    SHOULDERS    BACK PAIN 01/05/2010   Carotid artery occlusion    Cataract    bilateral, left worse   Centrilobular emphysema (HCC)    COPD (chronic obstructive pulmonary disease) (HCC)    Depression    DIVERTICULOSIS, COLON 06/05/2006   DIZZINESS OR VERTIGO 06/05/2006   positional   GERD (gastroesophageal reflux disease)    Headache    Migraines   Hearing loss in left ear    no hearing aid   History of blood transfusion 08/2020   History of kidney stones    passed stones   HYPERLIPIDEMIA 06/05/2006   Hypertension    HYPOKALEMIA 12/14/2008   Hypothyroidism    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   Qualifier: Diagnosis of  By: Jame  MD, Maude FALCON    Pneumonia    Pre-diabetes    TOBACCO USER 11/25/2007    Past Surgical History:  Procedure Laterality Date   APPENDECTOMY  bilateral cataract surgery      BRONCHIAL BIOPSY  09/28/2021   Procedure: BRONCHIAL BIOPSIES;  Surgeon: Gladis Leonor HERO, MD;  Location: [facility] ENDOSCOPY;  Service: Pulmonary;;   BRONCHIAL NEEDLE ASPIRATION BIOPSY  09/28/2021   Procedure: BRONCHIAL NEEDLE ASPIRATION BIOPSIES;  Surgeon: Gladis Leonor HERO, MD;  Location: [facility] ENDOSCOPY;  Service: Pulmonary;;   BRONCHIAL WASHINGS  09/28/2021   Procedure: BRONCHIAL WASHINGS;  Surgeon: Gladis Leonor HERO, MD;  Location[facility] ENDOSCOPY;  Service: Pulmonary;;   CESAREAN SECTION     x1   CHOLECYSTECTOMY     COLONOSCOPY     COSMETIC  SURGERY     CYSTOSCOPY W/ URETERAL STENT PLACEMENT Right 07/29/2022   Procedure: CYSTOSCOPY WITH RETROGRADE PYELOGRAM/URETERAL STENT PLACEMENT;  Surgeon: Sherrilee Belvie CROME, MD;  Location: [facility] ORS;  Service: Urology;  Laterality: Right;   CYSTOSCOPY/URETEROSCOPY/HOLMIUM LASER/STENT PLACEMENT Right 09/05/2022   Procedure: RIGHT URETEROSCOPY/HOLMIUM LASER/STENT PLACEMENT;  Surgeon: Lovie Arlyss CROME, MD;  Location: [facility] ORS;  Service: Urology;  Laterality: Right;   mastoid surgery  1992   shunt in mastoid- endolymphatic sac in left ear    MRI  07/11/2020   x several, last one located in CE   REVERSE SHOULDER ARTHROPLASTY Right 01/24/2023   Procedure: REVERSE SHOULDER ARTHROPLASTY;  Surgeon: Melita Drivers, MD;  Location: [facility] ORS;  Service: Orthopedics;  Laterality: Right;  120 min   TUBAL LIGATION     UPPER GI ENDOSCOPY     vocal cord surgery     nodule x2    Family History  Problem Relation Age of Onset   Breast cancer Mother    Dementia Mother    Heart disease Mother    Hypertension Mother    Thyroid  disease Mother    Alzheimer's disease Father    Heart disease Father    Hypertension Father    Thyroid  disease Father    Diabetes Sister    Colon cancer Neg Hx    Rectal cancer Neg Hx    Stomach cancer Neg Hx    Colon polyps Neg Hx    Esophageal cancer Neg Hx     Social History   Socioeconomic History   Marital status: Divorced    Spouse name: Not on file   Number of children: 1   Years of education: 13   Highest education level: Not on file  Occupational History   Occupation: ASSISTANT CREDIT MANAGER    Employer: [employer]    Comment: Retired  Tobacco Use   Smoking status: Every Day    Current packs/day: 0.50    Average packs/day: 0.5 packs/day for 49.0 years (24.5 ttl pk-yrs)    Types: Cigarettes   Smokeless tobacco: Never   Tobacco comments:    3/4 ppd 08/30/21 Sonny Lecher, CMA  Vaping Use   Vaping status: Former   Start date: 01/01/2010   Quit date:  08/01/2021   Substances: Nicotine    Devices: menthol/fruit flavor  Substance and Sexual Activity   Alcohol  use: No    Alcohol /week: 0.0 standard drinks of alcohol    Drug use: No   Sexual activity: Not Currently    Birth control/protection: Post-menopausal  Other Topics Concern   Not on file  Social History Narrative   Patient lives at home alone. Patient has a Biochemist, clinical. Patient is divorced .   Patient is retired.   Education high school.   Right handed.   Caffeine none.   Social Drivers of Corporate investment banker Strain: Low Risk  (  05/23/2023)   Received from [facility]   Overall Financial Resource Strain (CARDIA)    Difficulty of Paying Living Expenses: Not hard at all  Food Insecurity: No Food Insecurity (05/23/2023)   Received [facility]   Hunger Vital Sign    Within the past 12 months, you worried that your food would run out before you got the money to buy more.: Never true    Within the past 12 months, the food you bought just didn't last and you didn't have money to get more.: Never true  Transportation Needs: No Transportation Needs (05/23/2023)   Received from [facility] - Transportation    Lack of Transportation (Medical): No    Lack of Transportation (Non-Medical): No  Physical Activity: Inactive (10/31/2022)   Exercise Vital Sign    Days of Exercise per Week: 0 days    Minutes of Exercise per Session: 0 min  Stress: No Stress Concern Present (08/21/2023)   [employer] of Occupational Health - Occupational Stress Questionnaire    Feeling of Stress: Not at all  Social Connections: Socially Isolated (08/21/2023)   Social Connection and Isolation Panel    Frequency of Communication with Friends and Family: Three times a week    Frequency of Social Gatherings with Friends and Family: Three times a week    Attends Religious Services: Never    Active Member of Clubs or Organizations: No    Attends Banker  Meetings: Never    Marital Status: Divorced  Catering manager Violence: Not At Risk (07/03/2023)   Humiliation, Afraid, Rape, and Kick questionnaire    Fear of Current or Ex-Partner: No    Emotionally Abused: No    Physically Abused: No    Sexually Abused: No    Allergies  Allergen Reactions   Erythromycin Base Diarrhea   Vfend  [Voriconazole ] Other (See Comments)    Visual changes   Penicillins Rash    Current Outpatient Medications  Medication Sig Dispense Refill   acetaminophen  (TYLENOL ) 500 MG tablet Take 500-1,000 mg by mouth every 6 (six) hours as needed for mild pain, moderate pain or headache.     albuterol  (VENTOLIN  HFA) 108 (90 Base) MCG/ACT inhaler Inhale 2 puffs into the lungs every 6 (six) hours as needed for shortness of breath or wheezing. 8 g 2   atorvastatin  (LIPITOR) 20 MG tablet TAKE 1 TABLET(20 MG) BY MOUTH DAILY 90 tablet 1   calcitonin, salmon, (MIACALCIN /FORTICAL) 200 UNIT/ACT nasal spray Place 1 spray into alternate nostrils daily.     Carboxymethylcellul-Glycerin (LUBRICATING EYE DROPS OP) Place 1 drop into both eyes daily.     carvedilol  (COREG ) 25 MG tablet TAKE 1 TABLET(25 MG) BY MOUTH TWICE DAILY WITH A MEAL 180 tablet 0   cetirizine (ZYRTEC) 10 MG tablet Take 10 mg by mouth daily as needed for allergies.     chlorthalidone  (HYGROTON ) 25 MG tablet TAKE 1 TABLET(25 MG) BY MOUTH DAILY 90 tablet 1   Cholecalciferol (VITAMIN D ) 50 MCG (2000 UT) CAPS Take 2,000 Units by mouth daily.     conjugated estrogens  (PREMARIN ) vaginal cream Place 1 applicator vaginally daily as needed (dryness / burning).     cyclobenzaprine  (FLEXERIL ) 10 MG tablet Take 1 tablet (10 mg total) by mouth 3 (three) times daily as needed for muscle spasms. 30 tablet 1   diclofenac  Sodium (VOLTAREN ) 1 % GEL Apply 1 Application topically 4 (four) times daily as needed (pain).     EMGALITY 120 MG/ML SOAJ  famotidine  (PEPCID ) 40 MG tablet TAKE 1 TABLET(40 MG) BY MOUTH AT BEDTIME 90 tablet 1    fluorometholone (FML) 0.1 % ophthalmic suspension INSTILL 1 DROP INTO LEFT EYE THREE TIMES DAILY FOR 3 DAYS THEN TWICE DAILY FOR 3 DAYS THEN EVERY DAY FOR 3 DAYS THEN STOP     FLUoxetine  (PROZAC ) 20 MG capsule TAKE 3 CAPSULES(60 MG) BY MOUTH DAILY 270 capsule 0   fluticasone  (FLONASE ) 50 MCG/ACT nasal spray Place 2 sprays into both nostrils daily. SHAKE LIQUID AND USE 2 SPRAYS IN EACH NOSTRIL DAILY 16 g 6   Fluticasone -Umeclidin-Vilant (TRELEGY ELLIPTA ) 100-62.5-25 MCG/ACT AEPB INHALE 1 PUFF INTO THE LUNGS DAILY 180 each 3   ketoconazole  (NIZORAL ) 2 % shampoo Apply 1 Application topically 2 (two) times a week. 120 mL 0   levothyroxine  (SYNTHROID ) 25 MCG tablet TAKE 1 TABLET(25 MCG) BY MOUTH DAILY 90 tablet 0   montelukast  (SINGULAIR ) 10 MG tablet TAKE 1 TABLET(10 MG) BY MOUTH AT BEDTIME 90 tablet 3   nystatin -triamcinolone  ointment (MYCOLOG) Apply 1 Application topically 2 (two) times daily. 30 g 0   ondansetron  (ZOFRAN ) 4 MG tablet Take 1 tablet (4 mg total) by mouth every 8 (eight) hours as needed for nausea or vomiting. 10 tablet 0   OVER THE COUNTER MEDICATION Take 1 tablet by mouth in the morning. Salt Supplement     oxyCODONE -acetaminophen  (PERCOCET) 10-325 MG tablet Take 1 tablet by mouth every 8 (eight) hours as needed for pain.     pantoprazole  (PROTONIX ) 40 MG tablet TAKE 1 TABLET(40 MG) BY MOUTH DAILY 90 tablet 0   polyethylene glycol (MIRALAX  / GLYCOLAX ) 17 g packet Take 17 g by mouth daily.     potassium chloride  (MICRO-K ) 10 MEQ CR capsule TAKE 2 CAPSULES(20 MEQ) BY MOUTH TWICE DAILY 360 capsule 1   pregabalin  (LYRICA ) 25 MG capsule TAKE 1 CAPSULE(25 MG) BY MOUTH AT BEDTIME 90 capsule 0   promethazine  (PHENERGAN ) 25 MG tablet TAKE 1 TABLET BY MOUTH EVERY 8 HOURS IF NEEDED 20 tablet 0   rizatriptan  (MAXALT ) 10 MG tablet Take 10 mg by mouth as needed for migraine.     vitamin B-12 (CYANOCOBALAMIN ) 500 MCG tablet Take 500 mcg by mouth daily at 12 noon.     Current Facility-Administered  Medications  Medication Dose Route Frequency Provider Last Rate Last Admin   [START ON 01/06/2024] denosumab  (PROLIA ) injection 60 mg  60 mg Subcutaneous Once         PHYSICAL EXAM There were no vitals filed for this visit.   Elderly woman in no acute distress Regular rate and rhythm Unlabored breathing Soft, nontender abdomen 2+ posterior tibial pulses bilaterally   PERTINENT LABORATORY AND RADIOLOGIC DATA  Most recent CBC    Latest Ref Rng & Units 08/21/2023   10:33 AM 06/13/2023   10:51 AM 04/09/2023    1:38 PM  CBC  WBC 3.4 - 10.8 x10E3/uL 8.0  13.0  8.3   Hemoglobin 11.1 - 15.9 g/dL 87.7  86.6  87.4   Hematocrit 34.0 - 46.6 % 38.4  41.6  39.3   Platelets 150 - 450 x10E3/uL 342  351  327      Most recent CMP    Latest Ref Rng & Units 08/21/2023   10:33 AM 06/13/2023   10:51 AM 03/27/2023   11:42 AM  CMP  Glucose 70 - 99 mg/dL 92  91  84   BUN 8 - 27 mg/dL 11  13  13    Creatinine 0.57 -  1.00 mg/dL 9.33  9.48  9.39   Sodium 134 - 144 mmol/L 135  135  140   Potassium 3.5 - 5.2 mmol/L 4.4  3.8  4.4   Chloride 96 - 106 mmol/L 98  96  102   CO2 20 - 29 mmol/L 22  25  19    Calcium  8.7 - 10.3 mg/dL 9.3  9.9  89.9   Total Protein 6.0 - 8.5 g/dL 6.3  6.6  6.8   Total Bilirubin 0.0 - 1.2 mg/dL 0.5  0.2  0.3   Alkaline Phos 44 - 121 IU/L 87  98  102   AST 0 - 40 IU/L 28  11  14    ALT 0 - 32 IU/L 21  8  11      Renal function Estimated Creatinine Clearance: 41.6 mL/min (by C-G formula based on SCr of 0.66 mg/dL).  Hgb A1c MFr Bld (%)  Date Value  03/27/2023 5.1    LDL Chol Calc (NIH)  Date Value Ref Range Status  03/27/2023 44 0 - 99 mg/dL Final   Direct LDL  Date Value Ref Range Status  09/13/2011 151.9 mg/dL Final    Comment:    Optimal:  <100 mg/dLNear or Above Optimal:  100-129 mg/dLBorderline High:  130-159 mg/dLHigh:  160-189 mg/dLVery High:  >190 mg/dL    Abdominal aortic aneurysm duplex 08/27/23 = infrarenal abdominal aortic aneurysm 49 mm  Debby SAILOR.  Magda, MD FACS [facility] Phone Number: [phone] 08/26/2023 6:18 PM   Total time spent on preparing this encounter including chart review, data review, collecting history, examining the patient, coordinating care for this established patient, 30 minutes.  Portions of this report may have been transcribed using voice recognition software.  Every effort has been made to ensure accuracy; however, inadvertent computerized transcription errors may still be present.

## 2023-08-27 ENCOUNTER — Ambulatory Visit (HOSPITAL_COMMUNITY)
Admission: RE | Admit: 2023-08-27 | Discharge: 2023-08-27 | Disposition: A | Discharge status: Home / Self Care | Source: Ambulatory Visit | Attending: Vascular Surgery | Admitting: Vascular Surgery

## 2023-08-27 ENCOUNTER — Encounter: Payer: Self-pay | Admitting: Vascular Surgery

## 2023-08-27 ENCOUNTER — Ambulatory Visit: Admitting: Family Medicine

## 2023-08-27 ENCOUNTER — Ambulatory Visit: Attending: Vascular Surgery | Admitting: Vascular Surgery

## 2023-08-27 ENCOUNTER — Encounter: Payer: Self-pay | Admitting: Family Medicine

## 2023-08-27 VITALS — BP 130/71 | HR 61 | Temp 97.8°F | Ht <= 58 in | Wt 93.9 lb

## 2023-08-27 DIAGNOSIS — I7143 Infrarenal abdominal aortic aneurysm, without rupture: Secondary | ICD-10-CM | POA: Diagnosis not present

## 2023-08-27 DIAGNOSIS — M546 Pain in thoracic spine: Secondary | ICD-10-CM | POA: Diagnosis not present

## 2023-08-28 ENCOUNTER — Other Ambulatory Visit: Payer: Self-pay

## 2023-08-28 DIAGNOSIS — I7143 Infrarenal abdominal aortic aneurysm, without rupture: Secondary | ICD-10-CM

## 2023-09-10 ENCOUNTER — Encounter: Payer: Self-pay | Admitting: Family Medicine

## 2023-09-10 ENCOUNTER — Ambulatory Visit: Admitting: Family Medicine

## 2023-09-10 VITALS — BP 130/68 | HR 71 | Temp 97.8°F | Resp 16 | Ht <= 58 in | Wt 95.0 lb

## 2023-09-10 DIAGNOSIS — R339 Retention of urine, unspecified: Secondary | ICD-10-CM

## 2023-09-10 DIAGNOSIS — R490 Dysphonia: Secondary | ICD-10-CM | POA: Diagnosis not present

## 2023-09-10 DIAGNOSIS — J301 Allergic rhinitis due to pollen: Secondary | ICD-10-CM | POA: Diagnosis not present

## 2023-09-10 DIAGNOSIS — R053 Chronic cough: Secondary | ICD-10-CM | POA: Diagnosis not present

## 2023-09-10 DIAGNOSIS — J0100 Acute maxillary sinusitis, unspecified: Secondary | ICD-10-CM | POA: Diagnosis not present

## 2023-09-10 LAB — POCT URINALYSIS DIP (CLINITEK)
Blood, UA: NEGATIVE
Glucose, UA: NEGATIVE mg/dL
Leukocytes, UA: NEGATIVE
Nitrite, UA: NEGATIVE
Spec Grav, UA: 1.025 (ref 1.010–1.025)
Urobilinogen, UA: 0.2 U/dL
pH, UA: 6 (ref 5.0–8.0)

## 2023-09-10 MED ORDER — DOXYCYCLINE HYCLATE 100 MG PO TABS: 100.0000 mg | ORAL_TABLET | Freq: Two times a day (BID) | ORAL | 0 refills | Status: DC

## 2023-09-10 NOTE — Progress Notes (Signed)
 Cv ttttt  Subjective:  Patient ID: Allison Jimenez, female    DOB: [birth year]  Age: 73 y.o. MRN: [id number]  Chief Complaint  Patient presents with   Sore Throat    HPI: Discussed the use of AI scribe software for clinical note transcription with the patient, who gave verbal consent to proceed.  History of Present Illness ARIELL Jimenez is a 73 year old female who presents with persistent hoarseness and throat discomfort.  Hoarseness and throat discomfort - Persistent hoarseness and throat discomfort for the past two weeks - Voice described as 'raspy', with some days so affected she can hardly speak above a whisper - No pain with pressure applied to the throat - Warm salt water  gargles and hot beverages provide some relief  Postnasal drainage and upper respiratory symptoms - Significant postnasal drainage present - Zyrtec used to manage postnasal symptoms - Normal coughing, possibly slightly worse than usual - No ear symptoms  Medication use and allergies - Trelegy inhaler used for a couple of years, not daily due to insurance changes - Rinses mouth well after using Trelegy inhaler - Uses a nasal drop prescribed for dry eyes, applied in the nose - Allergic to penicillin and erythromycin - Previously treated with doxycycline  for a bladder infection, which also improved throat symptoms  Lifestyle factors - Smokes tobacco - Consumes coffee, water , and [employer]eps[employer]       07/03/2023   10:55 AM 03/27/2023   11:03 AM 12/24/2022   11:37 AM 09/27/2022    4:21 PM 09/17/2022    3:07 PM  Depression screen PHQ 2/9  Decreased Interest 1 2 0 1 1  Down, Depressed, Hopeless 0 1 0 1 0  PHQ - 2 Score 1 3 0 2 1  Altered sleeping 2 1 0  1  Tired, decreased energy 1 2 0  1  Change in appetite 0 1 0    Feeling bad or failure about yourself  0 1 0  0  Trouble concentrating 0 0 0  0  Moving slowly or fidgety/restless 0 1 0  0  Suicidal thoughts 0 1 0  0  PHQ-9 Score 4 10 0  3  Difficult doing  work/chores Not difficult at all Somewhat difficult Not difficult at all  Not difficult at all        07/03/2023   10:54 AM  Fall Risk   Falls in the past year? 0  Number falls in past yr: 0  Injury with Fall? 0  Risk for fall due to : Impaired balance/gait  Follow up Falls evaluation completed    Patient Care Team: Sherre Clapper, MD as PCP - General (Family Medicine) Ruthell Lauraine FALCON, NP as Nurse Practitioner (Pulmonary Disease) Livingston No, PA-C as Physician Assistant (General Practice) Misenheimer, Evalene, MD as Consulting Physician (Gastroenterology) Waylan Cain, MD as Consulting Physician (Ophthalmology) Rudy Cain CHRISTELLA DEVONNA (Physician Assistant) Gladis Leonor CHRISTELLA, MD as Consulting Physician (Pulmonary Disease) Ezzard Valaria LABOR, MD as Consulting Physician (Oncology) Pandora Cadet, [facility] (Pharmacist)   Review of Systems  Constitutional:  Negative for chills, fatigue and fever.  HENT:  Positive for sore throat and voice change. Negative for congestion and ear pain.   Respiratory:  Positive for cough. Negative for shortness of breath.   Cardiovascular:  Negative for chest pain.    Current Outpatient Medications on File Prior to Visit  Medication Sig Dispense Refill   acetaminophen  (TYLENOL ) 500 MG tablet Take 500-1,000 mg by mouth every 6 (six) hours as  needed for mild pain, moderate pain or headache.     albuterol  (VENTOLIN  HFA) 108 (90 Base) MCG/ACT inhaler Inhale 2 puffs into the lungs every 6 (six) hours as needed for shortness of breath or wheezing. 8 g 2   atorvastatin  (LIPITOR) 20 MG tablet TAKE 1 TABLET(20 MG) BY MOUTH DAILY 90 tablet 1   calcitonin, salmon, (MIACALCIN /FORTICAL) 200 UNIT/ACT nasal spray Place 1 spray into alternate nostrils daily.     Carboxymethylcellul-Glycerin (LUBRICATING EYE DROPS OP) Place 1 drop into both eyes daily.     carvedilol  (COREG ) 25 MG tablet TAKE 1 TABLET(25 MG) BY MOUTH TWICE DAILY WITH A MEAL 180 tablet 0   cetirizine (ZYRTEC) 10  MG tablet Take 10 mg by mouth daily as needed for allergies.     chlorthalidone  (HYGROTON ) 25 MG tablet TAKE 1 TABLET(25 MG) BY MOUTH DAILY 90 tablet 1   Cholecalciferol (VITAMIN D ) 50 MCG (2000 UT) CAPS Take 2,000 Units by mouth daily.     conjugated estrogens  (PREMARIN ) vaginal cream Place 1 applicator vaginally daily as needed (dryness / burning).     cyclobenzaprine  (FLEXERIL ) 10 MG tablet Take 1 tablet (10 mg total) by mouth 3 (three) times daily as needed for muscle spasms. 30 tablet 1   diclofenac  Sodium (VOLTAREN ) 1 % GEL Apply 1 Application topically 4 (four) times daily as needed (pain).     EMGALITY 120 MG/ML SOAJ      famotidine  (PEPCID ) 40 MG tablet TAKE 1 TABLET(40 MG) BY MOUTH AT BEDTIME 90 tablet 1   fluorometholone (FML) 0.1 % ophthalmic suspension INSTILL 1 DROP INTO LEFT EYE THREE TIMES DAILY FOR 3 DAYS THEN TWICE DAILY FOR 3 DAYS THEN EVERY DAY FOR 3 DAYS THEN STOP     FLUoxetine  (PROZAC ) 20 MG capsule TAKE 3 CAPSULES(60 MG) BY MOUTH DAILY 270 capsule 0   fluticasone  (FLONASE ) 50 MCG/ACT nasal spray Place 2 sprays into both nostrils daily. SHAKE LIQUID AND USE 2 SPRAYS IN EACH NOSTRIL DAILY 16 g 6   Fluticasone -Umeclidin-Vilant (TRELEGY ELLIPTA ) 100-62.5-25 MCG/ACT AEPB INHALE 1 PUFF INTO THE LUNGS DAILY 180 each 3   ketoconazole  (NIZORAL ) 2 % shampoo Apply 1 Application topically 2 (two) times a week. 120 mL 0   levothyroxine  (SYNTHROID ) 25 MCG tablet TAKE 1 TABLET(25 MCG) BY MOUTH DAILY 90 tablet 0   montelukast  (SINGULAIR ) 10 MG tablet TAKE 1 TABLET(10 MG) BY MOUTH AT BEDTIME 90 tablet 3   nystatin -triamcinolone  ointment (MYCOLOG) Apply 1 Application topically 2 (two) times daily. 30 g 0   ondansetron  (ZOFRAN ) 4 MG tablet Take 1 tablet (4 mg total) by mouth every 8 (eight) hours as needed for nausea or vomiting. 10 tablet 0   OVER THE COUNTER MEDICATION Take 1 tablet by mouth in the morning. Salt Supplement     oxyCODONE -acetaminophen  (PERCOCET) 10-325 MG tablet Take 1 tablet  by mouth every 8 (eight) hours as needed for pain.     pantoprazole  (PROTONIX ) 40 MG tablet TAKE 1 TABLET(40 MG) BY MOUTH DAILY 90 tablet 0   polyethylene glycol (MIRALAX  / GLYCOLAX ) 17 g packet Take 17 g by mouth daily.     potassium chloride  (MICRO-K ) 10 MEQ CR capsule TAKE 2 CAPSULES(20 MEQ) BY MOUTH TWICE DAILY 360 capsule 1   pregabalin  (LYRICA ) 25 MG capsule TAKE 1 CAPSULE(25 MG) BY MOUTH AT BEDTIME 90 capsule 0   promethazine  (PHENERGAN ) 25 MG tablet TAKE 1 TABLET BY MOUTH EVERY 8 HOURS IF NEEDED 20 tablet 0   rizatriptan  (MAXALT ) 10 MG tablet  Take 10 mg by mouth as needed for migraine.     TYRVAYA 0.03 MG/ACT SOLN Place 1 spray into both nostrils 2 (two) times daily.     vitamin B-12 (CYANOCOBALAMIN ) 500 MCG tablet Take 500 mcg by mouth daily at 12 noon.     Current Facility-Administered Medications on File Prior to Visit  Medication Dose Route Frequency Provider Last Rate Last Admin   [START ON 01/06/2024] denosumab  (PROLIA ) injection 60 mg  60 mg Subcutaneous Once        Past Medical History:  Diagnosis Date   Acute blood loss anemia (ABLA) 09/05/2020   Allergy    ANXIETY 06/05/2006   Arthritis    SHOULDERS    BACK PAIN 01/05/2010   Carotid artery occlusion    Cataract    bilateral, left worse   Centrilobular emphysema (HCC)    COPD (chronic obstructive pulmonary disease) (HCC)    Depression    DIVERTICULOSIS, COLON 06/05/2006   DIZZINESS OR VERTIGO 06/05/2006   positional   GERD (gastroesophageal reflux disease)    Headache    Migraines   Hearing loss in left ear    no hearing aid   History of blood transfusion 08/2020   History of kidney stones    passed stones   HYPERLIPIDEMIA 06/05/2006   Hypertension    HYPOKALEMIA 12/14/2008   Hypothyroidism    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   Qualifier: Diagnosis of  By: Jame  MD, Maude FALCON    Pneumonia    Pre-diabetes    TOBACCO USER 11/25/2007   Past Surgical History:  Procedure Laterality Date   APPENDECTOMY      bilateral cataract surgery      BRONCHIAL BIOPSY  09/28/2021   Procedure: BRONCHIAL BIOPSIES;  Surgeon: Gladis Leonor HERO, MD;  Location[facility] ENDOSCOPY;  Service: Pulmonary;;   BRONCHIAL NEEDLE ASPIRATION BIOPSY  09/28/2021   Procedure: BRONCHIAL NEEDLE ASPIRATION BIOPSIES;  Surgeon: Gladis Leonor HERO, MD;  Location: [facility] ENDOSCOPY;  Service: Pulmonary;;   BRONCHIAL WASHINGS  09/28/2021   Procedure: BRONCHIAL WASHINGS;  Surgeon: Gladis Leonor HERO, MD;  Location: [facility] ENDOSCOPY;  Service: Pulmonary;;   CESAREAN SECTION     x1   CHOLECYSTECTOMY     COLONOSCOPY     COSMETIC SURGERY     CYSTOSCOPY W/ URETERAL STENT PLACEMENT Right 07/29/2022   Procedure: CYSTOSCOPY WITH RETROGRADE PYELOGRAM/URETERAL STENT PLACEMENT;  Surgeon: Sherrilee Belvie CROME, MD;  Location: [facility] ORS;  Service: Urology;  Laterality: Right;   CYSTOSCOPY/URETEROSCOPY/HOLMIUM LASER/STENT PLACEMENT Right 09/05/2022   Procedure: RIGHT URETEROSCOPY/HOLMIUM LASER/STENT PLACEMENT;  Surgeon: Lovie Arlyss CROME, MD;  Location: [facility] ORS;  Service: Urology;  Laterality: Right;   mastoid surgery  1992   shunt in mastoid- endolymphatic sac in left ear    MRI  07/11/2020   x several, last one located in CE   REVERSE SHOULDER ARTHROPLASTY Right 01/24/2023   Procedure: REVERSE SHOULDER ARTHROPLASTY;  Surgeon: Melita Drivers, MD;  Location: [facility] ORS;  Service: Orthopedics;  Laterality: Right;  120 min   TUBAL LIGATION     UPPER GI ENDOSCOPY     vocal cord surgery     nodule x2    Family History  Problem Relation Age of Onset   Breast cancer Mother    Dementia Mother    Heart disease Mother    Hypertension Mother    Thyroid  disease Mother    Alzheimer's disease Father    Heart disease Father    Hypertension Father    Thyroid  disease Father  Diabetes Sister    Colon cancer Neg Hx    Rectal cancer Neg Hx    Stomach cancer Neg Hx    Colon polyps Neg Hx    Esophageal cancer Neg Hx    Social History   Socioeconomic History   Marital  status: Divorced    Spouse name: Not on file   Number of children: 1   Years of education: 13   Highest education level: Not on file  Occupational History   Occupation: ASSISTANT CREDIT MANAGER    Employer: [employer]    Comment: Retired  Tobacco Use   Smoking status: Every Day    Current packs/day: 0.50    Average packs/day: 0.5 packs/day for 49.0 years (24.5 ttl pk-yrs)    Types: Cigarettes   Smokeless tobacco: Never   Tobacco comments:    3/4 ppd 08/30/21 Sonny Lecher, CMA  Vaping Use   Vaping status: Former   Start date: 01/01/2010   Quit date: 08/01/2021   Substances: Nicotine    Devices: menthol/fruit flavor  Substance and Sexual Activity   Alcohol  use: No    Alcohol /week: 0.0 standard drinks of alcohol    Drug use: No   Sexual activity: Not Currently    Birth control/protection: Post-menopausal  Other Topics Concern   Not on file  Social History Narrative   Patient lives at home alone. Patient has a Biochemist, clinical. Patient is divorced .   Patient is retired.   Education high school.   Right handed.   Caffeine none.   Social Drivers of Corporate investment banker Strain: Low Risk  (05/23/2023)   Received from [facility]   Overall Financial Resource Strain (CARDIA)    Difficulty of Paying Living Expenses: Not hard at all  Food Insecurity: No Food Insecurity (05/23/2023)   Received from [facility]   Hunger Vital Sign    Within the past 12 months, you worried that your food would run out before you got the money to buy more.: Never true    Within the past 12 months, the food you bought just didn't last and you didn't have money to get more.: Never true  Transportation Needs: No Transportation Needs (05/23/2023)   Received from [facility] - Transportation    Lack of Transportation (Medical): No    Lack of Transportation (Non-Medical): No  Physical Activity: Inactive (10/31/2022)   Exercise Vital Sign    Days of Exercise per  Week: 0 days    Minutes of Exercise per Session: 0 min  Stress: No Stress Concern Present (08/21/2023)   [employer] of Occupational Health - Occupational Stress Questionnaire    Feeling of Stress: Not at all  Social Connections: Socially Isolated (08/21/2023)   Social Connection and Isolation Panel    Frequency of Communication with Friends and Family: Three times a week    Frequency of Social Gatherings with Friends and Family: Three times a week    Attends Religious Services: Never    Active Member of Clubs or Organizations: No    Attends Banker Meetings: Never    Marital Status: Divorced    Objective:  BP 130/68   Pulse 71   Temp 97.8 F (36.6 C) (Temporal)   Resp 16   Ht 4' 10 (1.473 m)   Wt 95 lb (43.1 kg)   SpO2 98%   BMI 19.86 kg/m      09/10/2023    4:07 PM 08/27/2023    9:26  AM 08/21/2023   10:11 AM  BP/Weight  Systolic BP 130 130 122  Diastolic BP 68 71 74  Wt. (Lbs) 95 93.9 94  BMI 19.86 kg/m2 19.63 kg/m2 19.65 kg/m2    Physical Exam Vitals reviewed.  Constitutional:      Appearance: Normal appearance.  HENT:     Right Ear: Tympanic membrane, ear canal and external ear normal.     Left Ear: Tympanic membrane, ear canal and external ear normal.     Nose:     Right Turbinates: Enlarged.     Left Turbinates: Enlarged.     Right Sinus: Maxillary sinus tenderness present.     Left Sinus: Maxillary sinus tenderness present.     Mouth/Throat:     Pharynx: Oropharynx is clear. No oropharyngeal exudate or posterior oropharyngeal erythema.     Tonsils: No tonsillar exudate.  Cardiovascular:     Rate and Rhythm: Normal rate and regular rhythm.     Heart sounds: Normal heart sounds. No murmur heard. Pulmonary:     Effort: Pulmonary effort is normal. No respiratory distress.     Breath sounds: Normal breath sounds.  Lymphadenopathy:     Cervical: No cervical adenopathy.  Neurological:     Mental Status: She is alert and oriented to person,  place, and time.  Psychiatric:        Mood and Affect: Mood normal.        Behavior: Behavior normal.         Lab Results  Component Value Date   WBC 8.0 08/21/2023   HGB 12.2 08/21/2023   HCT 38.4 08/21/2023   PLT 342 08/21/2023   GLUCOSE 92 08/21/2023   CHOL 104 03/27/2023   TRIG 102 03/27/2023   HDL 41 03/27/2023   LDLDIRECT 151.9 09/13/2011   LDLCALC 44 03/27/2023   ALT 21 08/21/2023   AST 28 08/21/2023   NA 135 08/21/2023   K 4.4 08/21/2023   CL 98 08/21/2023   CREATININE 0.66 08/21/2023   BUN 11 08/21/2023   CO2 22 08/21/2023   TSH 1.980 06/13/2023   INR 1.1 07/29/2022   HGBA1C 5.1 03/27/2023      Assessment & Plan:  Incomplete emptying of bladder Assessment & Plan: Chronic bladder dysfunction with recurrent urinary tract infections. Difficulty emptying the bladder completely. Previous doxycycline  treatment for a bladder infection also alleviated throat symptoms. - Prescribe doxycycline , which may also help with bladder symptoms. - Consider referral to a urologist for further evaluation.  Orders: -     POCT URINALYSIS DIP (CLINITEK)  Acute non-recurrent maxillary sinusitis Assessment & Plan: Doxycycline  prescribed.   Orders: -     Doxycycline  Hyclate; Take 1 tablet (100 mg total) by mouth 2 (two) times daily.  Dispense: 20 tablet; Refill: 0  Hoarseness Assessment & Plan: Chronic hoarseness and sore throat with postnasal drainage persisting for two weeks. Previous doxycycline  treatment for a bladder infection also alleviated throat symptoms. - Prescribe doxycycline  twice a day for ten days. - Recommend gargling warm salt water . - Advise drinking hot beverages and plenty of water .   Seasonal allergic rhinitis due to pollen Assessment & Plan: Allergic rhinitis with nasal drainage and dry eyes. Using Zyrtec and nasal drops prescribed by an eye doctor.   Chronic cough Assessment & Plan: Chronic cough with no significant change in pattern. Slight  increase in severity. Lungs sound clear. Recommend quit smoking.        Meds ordered this encounter  Medications   doxycycline  (VIBRA -TABS)  100 MG tablet    Sig: Take 1 tablet (100 mg total) by mouth 2 (two) times daily.    Dispense:  20 tablet    Refill:  0    Orders Placed This Encounter  Procedures   POCT URINALYSIS DIP (CLINITEK)     Follow-up: Return if symptoms worsen or fail to improve.  An After Visit Summary was printed and given to the patient.  Abigail Free, MD [facility] [phone]

## 2023-09-10 NOTE — Patient Instructions (Signed)
  VISIT SUMMARY: During your visit, we discussed your persistent hoarseness and throat discomfort, which have been ongoing for two weeks. We also reviewed your chronic bladder dysfunction and recurrent urinary tract infections, as well as your chronic cough and allergic rhinitis.  YOUR PLAN: CHRONIC HOARSENESS AND SORE THROAT WITH POSTNASAL DRAINAGE: You have been experiencing hoarseness and throat discomfort for the past two weeks, along with postnasal drainage. -Take doxycycline  twice a day for ten days. -Gargle with warm salt water . -Drink hot beverages and plenty of water .  CHRONIC BLADDER DYSFUNCTION WITH RECURRENT URINARY TRACT INFECTIONS: You have chronic bladder dysfunction and recurrent urinary tract infections, which have been previously treated with doxycycline . -Take doxycycline , which may also help with your bladder symptoms. -Provide a urine sample for analysis. -Consider seeing a urologist for further evaluation.  CHRONIC COUGH: You have a chronic cough that has slightly increased in severity, but your lungs sound clear. -Continue monitoring your cough. No new treatment is needed at this time.  ALLERGIC RHINITIS: You have allergic rhinitis with nasal drainage and dry eyes. -Continue using Zyrtec and the nasal drops prescribed by your eye doctor.                      Contains text generated by [employer]bridge.                                 Contains text generated by Abridge.

## 2023-09-12 DIAGNOSIS — H04122 Dry eye syndrome of left lacrimal gland: Secondary | ICD-10-CM | POA: Diagnosis not present

## 2023-09-12 DIAGNOSIS — H04121 Dry eye syndrome of right lacrimal gland: Secondary | ICD-10-CM | POA: Diagnosis not present

## 2023-09-12 DIAGNOSIS — H04123 Dry eye syndrome of bilateral lacrimal glands: Secondary | ICD-10-CM | POA: Diagnosis not present

## 2023-09-16 ENCOUNTER — Encounter: Payer: Self-pay | Admitting: Family Medicine

## 2023-09-16 DIAGNOSIS — R053 Chronic cough: Secondary | ICD-10-CM | POA: Insufficient documentation

## 2023-09-16 NOTE — Assessment & Plan Note (Signed)
 Chronic bladder dysfunction with recurrent urinary tract infections. Difficulty emptying the bladder completely. Previous doxycycline  treatment for a bladder infection also alleviated throat symptoms. - Prescribe doxycycline , which may also help with bladder symptoms. - Consider referral to a urologist for further evaluation.

## 2023-09-16 NOTE — Assessment & Plan Note (Signed)
 Chronic hoarseness and sore throat with postnasal drainage persisting for two weeks. Previous doxycycline  treatment for a bladder infection also alleviated throat symptoms. - Prescribe doxycycline  twice a day for ten days. - Recommend gargling warm salt water . - Advise drinking hot beverages and plenty of water .

## 2023-09-16 NOTE — Assessment & Plan Note (Signed)
 Chronic cough with no significant change in pattern. Slight increase in severity. Lungs sound clear. Recommend quit smoking.

## 2023-09-16 NOTE — Assessment & Plan Note (Signed)
 Allergic rhinitis with nasal drainage and dry eyes. Using Zyrtec and nasal drops prescribed by an eye doctor.

## 2023-09-16 NOTE — Assessment & Plan Note (Signed)
Doxycycline prescribed

## 2023-09-18 ENCOUNTER — Ambulatory Visit: Admitting: Emergency Medicine

## 2023-09-18 ENCOUNTER — Encounter: Payer: Self-pay | Admitting: Emergency Medicine

## 2023-09-18 VITALS — BP 132/68 | HR 69 | Temp 98.6°F | Ht <= 58 in | Wt 94.4 lb

## 2023-09-18 DIAGNOSIS — F17210 Nicotine dependence, cigarettes, uncomplicated: Secondary | ICD-10-CM

## 2023-09-18 DIAGNOSIS — J449 Chronic obstructive pulmonary disease, unspecified: Secondary | ICD-10-CM | POA: Diagnosis not present

## 2023-09-18 DIAGNOSIS — F17200 Nicotine dependence, unspecified, uncomplicated: Secondary | ICD-10-CM

## 2023-09-18 DIAGNOSIS — R918 Other nonspecific abnormal finding of lung field: Secondary | ICD-10-CM

## 2023-09-18 NOTE — Assessment & Plan Note (Signed)
 With a history of cavitary aspergillosis that was treated with itraconazole .  Her CT scan remained stable, pulmonary nodules were unchanged.  She needs continued surveillance clinically and radiographically.  Next scan to be done in July 2026

## 2023-09-18 NOTE — Patient Instructions (Signed)
 Reviewed your CT scan of the chest today. We will plan to repeat your CT chest in July 2026 to follow pulmonary nodules for stability. Please continue your Trelegy once daily.  Rinse and gargle after using. Keep albuterol  available to use 2 puffs if needed for shortness of breath, chest tightness, wheezing. Get your flu shot and your COVID-19 vaccine this fall Continue to work on decreasing your cigarettes. Follow in our office in July 2026.  Please call sooner if you have any problems.

## 2023-09-18 NOTE — Progress Notes (Signed)
 Subjective:    Patient ID: Allison Jimenez, female    DOB: Aug 25, 1950, 73 y.o.   MRN: [id number]  HPI   ROV 09/18/2023 --73 year old woman with COPD and chronic cavitary aspergillosis that was treated with itraconazole  in 2024.  I saw her in February 2025 for her COPD, tobacco use and for pulmonary nodular disease on CT scan of the chest.  Her most recent scan showed multiple bilateral nodules many stable some slightly decreased in size with an index nodule of 8 mm in the right upper lobe. Currently managed on Trelegy, but she doesn't use it every day. She has not needed any albuterol .  She is on doxycycline  currently for a laryngitis/ sinusitis.  She continues to smoke approximately 15 a day.  She has some dyspnea with her back pain, with some exertion. She gets around with a walker. She has daily cough w clear/white mucous. She has had some throat irritation and vocal changes that prompted start doxycycline . She has some anxiety and depression. No prednisone  since last time.   CT scan of the chest 07/31/2023 reviewed by me shows severe emphysema, multiple bilateral pulmonary nodules that are unchanged and appear to have some faint calcification including a 1.4 x 1.  0 left upper lobe nodule, 0.9 x 0.8 cm peripheral right upper lobe nodule.   Review of Systems As per HPI  Past Medical History:  Diagnosis Date   Acute blood loss anemia (ABLA) 09/05/2020   Allergy    ANXIETY 06/05/2006   Arthritis    SHOULDERS    BACK PAIN 01/05/2010   Carotid artery occlusion    Cataract    bilateral, left worse   Centrilobular emphysema (HCC)    COPD (chronic obstructive pulmonary disease) (HCC)    Depression    DIVERTICULOSIS, COLON 06/05/2006   DIZZINESS OR VERTIGO 06/05/2006   positional   GERD (gastroesophageal reflux disease)    Headache    Migraines   Hearing loss in left ear    no hearing aid   History of blood transfusion 08/2020   History of kidney stones    passed stones    HYPERLIPIDEMIA 06/05/2006   Hypertension    HYPOKALEMIA 12/14/2008   Hypothyroidism    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   Qualifier: Diagnosis of  By: Jame  MD, Maude FALCON    Pneumonia    Pre-diabetes    TOBACCO USER 11/25/2007     Family History  Problem Relation Age of Onset   Breast cancer Mother    Dementia Mother    Heart disease Mother    Hypertension Mother    Thyroid  disease Mother    Alzheimer's disease Father    Heart disease Father    Hypertension Father    Thyroid  disease Father    Diabetes Sister    Colon cancer Neg Hx    Rectal cancer Neg Hx    Stomach cancer Neg Hx    Colon polyps Neg Hx    Esophageal cancer Neg Hx      Social History   Socioeconomic History   Marital status: Divorced    Spouse name: Not on file   Number of children: 1   Years of education: 13   Highest education level: Not on file  Occupational History   Occupation: ASSISTANT CREDIT MANAGER    Employer: [employer]    Comment: Retired  Tobacco Use   Smoking status: Every Day    Current packs/day: 0.50    Average packs/day: 0.5  packs/day for 49.0 years (24.5 ttl pk-yrs)    Types: Cigarettes   Smokeless tobacco: Never   Tobacco comments:    3/4 ppd 09/18/23 Marcus HERO CMA  Vaping Use   Vaping status: Former   Start date: 01/01/2010   Quit date: 08/01/2021   Substances: Nicotine    Devices: menthol/fruit flavor  Substance and Sexual Activity   Alcohol  use: No    Alcohol /week: 0.0 standard drinks of alcohol    Drug use: No   Sexual activity: Not Currently    Birth control/protection: Post-menopausal  Other Topics Concern   Not on file  Social History Narrative   Patient lives at home alone. Patient has a Biochemist, clinical. Patient is divorced .   Patient is retired.   Education high school.   Right handed.   Caffeine none.   Social Drivers of Corporate investment banker Strain: Low Risk  (05/23/2023)   [facility]   Overall Financial Resource  Strain (CARDIA)    Difficulty of Paying Living Expenses: Not hard at all  Food Insecurity: No Food Insecurity (05/23/2023)   Received from [facility]   Hunger Vital Sign    Within the past 12 months, you worried that your food would run out before you got the money to buy more.: Never true    Within the past 12 months, the food you bought just didn't last and you didn't have money to get more.: Never true  Transportation Needs: No Transportation Needs (05/23/2023)   Received from [facility] - Transportation    Lack of Transportation (Medical): No    Lack of Transportation (Non-Medical): No  Physical Activity: Inactive (10/31/2022)   Exercise Vital Sign    Days of Exercise per Week: 0 days    Minutes of Exercise per Session: 0 min  Stress: No Stress Concern Present (08/21/2023)   [employer] of Occupational Health - Occupational Stress Questionnaire    Feeling of Stress: Not at all  Social Connections: Socially Isolated (08/21/2023)   Social Connection and Isolation Panel    Frequency of Communication with Friends and Family: Three times a week    Frequency of Social Gatherings with Friends and Family: Three times a week    Attends Religious Services: Never    Active Member of Clubs or Organizations: No    Attends Banker Meetings: Never    Marital Status: Divorced  Catering manager Violence: Not At Risk (07/03/2023)   Humiliation, Afraid, Rape, and Kick questionnaire    Fear of Current or Ex-Partner: No    Emotionally Abused: No    Physically Abused: No    Sexually Abused: No     Allergies  Allergen Reactions   Erythromycin Base Diarrhea   Vfend  [Voriconazole ] Other (See Comments)    Visual changes   Penicillins Rash     Outpatient Medications Prior to Visit  Medication Sig Dispense Refill   acetaminophen  (TYLENOL ) 500 MG tablet Take 500-1,000 mg by mouth every 6 (six) hours as needed for mild pain, moderate pain or headache.     albuterol   (VENTOLIN  HFA) 108 (90 Base) MCG/ACT inhaler Inhale 2 puffs into the lungs every 6 (six) hours as needed for shortness of breath or wheezing. 8 g 2   atorvastatin  (LIPITOR) 20 MG tablet TAKE 1 TABLET(20 MG) BY MOUTH DAILY 90 tablet 1   calcitonin, salmon, (MIACALCIN /FORTICAL) 200 UNIT/ACT nasal spray Place 1 spray into alternate nostrils daily.     Carboxymethylcellul-Glycerin (LUBRICATING  EYE DROPS OP) Place 1 drop into both eyes daily.     carvedilol  (COREG ) 25 MG tablet TAKE 1 TABLET(25 MG) BY MOUTH TWICE DAILY WITH A MEAL 180 tablet 0   cetirizine (ZYRTEC) 10 MG tablet Take 10 mg by mouth daily as needed for allergies.     chlorthalidone  (HYGROTON ) 25 MG tablet TAKE 1 TABLET(25 MG) BY MOUTH DAILY 90 tablet 1   Cholecalciferol (VITAMIN D ) 50 MCG (2000 UT) CAPS Take 2,000 Units by mouth daily.     conjugated estrogens  (PREMARIN ) vaginal cream Place 1 applicator vaginally daily as needed (dryness / burning).     cyclobenzaprine  (FLEXERIL ) 10 MG tablet Take 1 tablet (10 mg total) by mouth 3 (three) times daily as needed for muscle spasms. 30 tablet 1   diclofenac  Sodium (VOLTAREN ) 1 % GEL Apply 1 Application topically 4 (four) times daily as needed (pain).     doxycycline  (VIBRA -TABS) 100 MG tablet Take 1 tablet (100 mg total) by mouth 2 (two) times daily. 20 tablet 0   EMGALITY 120 MG/ML SOAJ      famotidine  (PEPCID ) 40 MG tablet TAKE 1 TABLET(40 MG) BY MOUTH AT BEDTIME 90 tablet 1   fluorometholone (FML) 0.1 % ophthalmic suspension INSTILL 1 DROP INTO LEFT EYE THREE TIMES DAILY FOR 3 DAYS THEN TWICE DAILY FOR 3 DAYS THEN EVERY DAY FOR 3 DAYS THEN STOP     FLUoxetine  (PROZAC ) 20 MG capsule TAKE 3 CAPSULES(60 MG) BY MOUTH DAILY 270 capsule 0   fluticasone  (FLONASE ) 50 MCG/ACT nasal spray Place 2 sprays into both nostrils daily. SHAKE LIQUID AND USE 2 SPRAYS IN EACH NOSTRIL DAILY 16 g 6   Fluticasone -Umeclidin-Vilant (TRELEGY ELLIPTA ) 100-62.5-25 MCG/ACT AEPB INHALE 1 PUFF INTO THE LUNGS DAILY 180  each 3   ketoconazole  (NIZORAL ) 2 % shampoo Apply 1 Application topically 2 (two) times a week. 120 mL 0   levothyroxine  (SYNTHROID ) 25 MCG tablet TAKE 1 TABLET(25 MCG) BY MOUTH DAILY 90 tablet 0   montelukast  (SINGULAIR ) 10 MG tablet TAKE 1 TABLET(10 MG) BY MOUTH AT BEDTIME 90 tablet 3   nystatin -triamcinolone  ointment (MYCOLOG) Apply 1 Application topically 2 (two) times daily. 30 g 0   ondansetron  (ZOFRAN ) 4 MG tablet Take 1 tablet (4 mg total) by mouth every 8 (eight) hours as needed for nausea or vomiting. 10 tablet 0   OVER THE COUNTER MEDICATION Take 1 tablet by mouth in the morning. Salt Supplement     oxyCODONE -acetaminophen  (PERCOCET) 10-325 MG tablet Take 1 tablet by mouth every 8 (eight) hours as needed for pain.     pantoprazole  (PROTONIX ) 40 MG tablet TAKE 1 TABLET(40 MG) BY MOUTH DAILY 90 tablet 0   polyethylene glycol (MIRALAX  / GLYCOLAX ) 17 g packet Take 17 g by mouth daily.     potassium chloride  (MICRO-K ) 10 MEQ CR capsule TAKE 2 CAPSULES(20 MEQ) BY MOUTH TWICE DAILY 360 capsule 1   pregabalin  (LYRICA ) 25 MG capsule TAKE 1 CAPSULE(25 MG) BY MOUTH AT BEDTIME 90 capsule 0   promethazine  (PHENERGAN ) 25 MG tablet TAKE 1 TABLET BY MOUTH EVERY 8 HOURS IF NEEDED 20 tablet 0   rizatriptan  (MAXALT ) 10 MG tablet Take 10 mg by mouth as needed for migraine.     TYRVAYA 0.03 MG/ACT SOLN Place 1 spray into both nostrils 2 (two) times daily.     vitamin B-12 (CYANOCOBALAMIN ) 500 MCG tablet Take 500 mcg by mouth daily at 12 noon.     Facility-Administered Medications Prior to Visit  Medication Dose Route  Frequency Provider Last Rate Last Admin   [START ON 01/06/2024] denosumab  (PROLIA ) injection 60 mg  60 mg Subcutaneous Once             Objective:   Physical Exam  Vitals:   09/18/23 1453  BP: 132/68  Pulse: 69  Temp: 98.6 F (37 C)  SpO2: 99%  Weight: 94 lb 6.4 oz (42.8 kg)  Height: 4' 10 (1.473 m)    Gen: Pleasant, thin, in no distress,  normal affect  ENT: No lesions,   mouth clear,  oropharynx clear, no postnasal drip  Neck: No JVD, no stridor  Lungs: No use of accessory muscles, bilateral scattered rhonchi.  She does have some end expiratory wheeze on forced expiration  Cardiovascular: RRR, heart sounds normal, no murmur or gallops, no peripheral edema  Musculoskeletal: No deformities, no cyanosis or clubbing  Neuro: alert, awake, non focal  Skin: Warm, no lesions or rash, venous stasis changes on her ankles      Assessment & Plan:  COPD (chronic obstructive pulmonary disease) (HCC) Please continue your Trelegy once daily.  Rinse and gargle after using. Keep albuterol  available to use 2 puffs if needed for shortness of breath, chest tightness, wheezing. Get your flu shot and your COVID-19 vaccine this fall Continue to work on decreasing your cigarettes. Follow in our office in July 2026.  Please call sooner if you have any problems.  Pulmonary nodules With a history of cavitary aspergillosis that was treated with itraconazole .  Her CT scan remained stable, pulmonary nodules were unchanged.  She needs continued surveillance clinically and radiographically.  Next scan to be done in July 2026  TOBACCO USER Discussed tobacco use and tobacco cessation with her.  She has increased her use to 15 cigarettes daily.  She thinks that she can get back down to 10 cigarettes daily and we set a goal to accomplish this.  Will try to support her in any way that we can.    Lamar Chris, MD, PhD 09/18/2023, 3:21 PM [facility] [phone] or if no answer before 7:00PM call [phone] For any issues after 7:00PM please call eLink [phone]

## 2023-09-18 NOTE — Assessment & Plan Note (Signed)
 Discussed tobacco use and tobacco cessation with her.  She has increased her use to 15 cigarettes daily.  She thinks that she can get back down to 10 cigarettes daily and we set a goal to accomplish this.  Will try to support her in any way that we can.

## 2023-09-18 NOTE — Assessment & Plan Note (Signed)
 Please continue your Trelegy once daily.  Rinse and gargle after using. Keep albuterol  available to use 2 puffs if needed for shortness of breath, chest tightness, wheezing. Get your flu shot and your COVID-19 vaccine this fall Continue to work on decreasing your cigarettes. Follow in our office in July 2026.  Please call sooner if you have any problems.

## 2023-09-23 ENCOUNTER — Other Ambulatory Visit: Payer: Self-pay | Admitting: Family Medicine

## 2023-09-25 DIAGNOSIS — G43909 Migraine, unspecified, not intractable, without status migrainosus: Secondary | ICD-10-CM | POA: Diagnosis not present

## 2023-10-04 ENCOUNTER — Other Ambulatory Visit: Payer: Self-pay | Admitting: Physician Assistant

## 2023-10-04 DIAGNOSIS — F411 Generalized anxiety disorder: Secondary | ICD-10-CM

## 2023-10-07 DIAGNOSIS — M545 Low back pain, unspecified: Secondary | ICD-10-CM | POA: Diagnosis not present

## 2023-10-07 DIAGNOSIS — M47816 Spondylosis without myelopathy or radiculopathy, lumbar region: Secondary | ICD-10-CM | POA: Diagnosis not present

## 2023-10-07 DIAGNOSIS — M810 Age-related osteoporosis without current pathological fracture: Secondary | ICD-10-CM | POA: Diagnosis not present

## 2023-10-07 DIAGNOSIS — M791 Myalgia, unspecified site: Secondary | ICD-10-CM | POA: Diagnosis not present

## 2023-10-07 DIAGNOSIS — G8929 Other chronic pain: Secondary | ICD-10-CM | POA: Diagnosis not present

## 2023-10-09 NOTE — Progress Notes (Unsigned)
 [facility]  [address] [place],  [place]  [address] [phone]  Clinic Day:  04/09/2023  Referring physician: Sherre Clapper, MD   HISTORY OF PRESENT ILLNESS:  The patient is a 73 y.o. female with iron  deficiency anemia.  She comes in today to reassess her iron  levels and her peripheral counts.  Of note, she last received IV iron  in October 2024.  Since her last visit, the patient has been doing relatively well.  She continues to deny having any overt forms of blood loss.  This patient did undergo both an EGD and colonoscopy in October 2024.  2 duodenal AVMs were seen and ablated with her EGD.  Her colonoscopy revealed 1 polyp, which was removed.   PHYSICAL EXAM:  There were no vitals taken for this visit. Wt Readings from Last 3 Encounters:  09/18/23 94 lb 6.4 oz (42.8 kg)  09/10/23 95 lb (43.1 kg)  08/27/23 93 lb 14.4 oz (42.6 kg)   There is no height or weight on file to calculate BMI. Performance status (ECOG): 1 - Symptomatic but completely ambulatory Physical Exam Constitutional:      Appearance: Normal appearance. She is not ill-appearing.  HENT:     Mouth/Throat:     Mouth: Mucous membranes are moist.     Pharynx: Oropharynx is clear. No oropharyngeal exudate or posterior oropharyngeal erythema.  Cardiovascular:     Rate and Rhythm: Normal rate and regular rhythm.     Heart sounds: No murmur heard.    No friction rub. No gallop.  Pulmonary:     Effort: Pulmonary effort is normal. No respiratory distress.     Breath sounds: Normal breath sounds. No wheezing, rhonchi or rales.  Abdominal:     General: Bowel sounds are normal. There is no distension.     Palpations: Abdomen is soft. There is no mass.     Tenderness: There is no abdominal tenderness.  Musculoskeletal:        General: No swelling.     Right lower leg: No edema.     Left lower leg: No edema.  Lymphadenopathy:     Cervical: No cervical adenopathy.     Upper Body:      Right upper body: No supraclavicular or axillary adenopathy.     Left upper body: No supraclavicular or axillary adenopathy.     Lower Body: No right inguinal adenopathy. No left inguinal adenopathy.  Skin:    General: Skin is warm.     Coloration: Skin is not jaundiced.     Findings: No lesion or rash.  Neurological:     General: No focal deficit present.     Mental Status: She is alert and oriented to person, place, and time. Mental status is at baseline.  Psychiatric:        Mood and Affect: Mood normal.        Behavior: Behavior normal.        Thought Content: Thought content normal.    LABS:      Latest Ref Rng & Units 08/21/2023   10:33 AM 06/13/2023   10:51 AM 04/09/2023    1:38 PM  CBC  WBC 3.4 - 10.8 x10E3/uL 8.0  13.0  8.3   Hemoglobin 11.1 - 15.9 g/dL 87.7  86.6  87.4   Hematocrit 34.0 - 46.6 % 38.4  41.6  39.3   Platelets 150 - 450 x10E3/uL 342  351  327       Latest Ref  Rng & Units 08/21/2023   10:33 AM 06/13/2023   10:51 AM 03/27/2023   11:42 AM  CMP  Glucose 70 - 99 mg/dL 92  91  84   BUN 8 - 27 mg/dL 11  13  13    Creatinine 0.57 - 1.00 mg/dL 9.33  9.48  9.39   Sodium 134 - 144 mmol/L 135  135  140   Potassium 3.5 - 5.2 mmol/L 4.4  3.8  4.4   Chloride 96 - 106 mmol/L 98  96  102   CO2 20 - 29 mmol/L 22  25  19    Calcium  8.7 - 10.3 mg/dL 9.3  9.9  89.9   Total Protein 6.0 - 8.5 g/dL 6.3  6.6  6.8   Total Bilirubin 0.0 - 1.2 mg/dL 0.5  0.2  0.3   Alkaline Phos 44 - 121 IU/L 87  98  102   AST 0 - 40 IU/L 28  11  14    ALT 0 - 32 IU/L 21  8  11      Latest Reference Range & Units 04/09/23 13:38  Iron  28 - 170 ug/dL 71  UIBC ug/dL 728  TIBC 749 - 549 ug/dL 657  Saturation Ratios 10.4 - 31.8 % 21  Ferritin 11 - 307 ng/mL 279    ASSESSMENT & PLAN:  A 73 y.o. female with a history of iron  deficiency anemia.  When evaluating her labs today, I am very pleased with her hemoglobin and iron  levels.  From a hematologic standpoint, the patient is doing well.  As that  is the case, I will see her back in 6 months for repeat clinical assessment.  The patient understands all the plans discussed today and is in agreement with them.  Finneus Kaneshiro DELENA Kerns, MD

## 2023-10-10 ENCOUNTER — Inpatient Hospital Stay (HOSPITAL_BASED_OUTPATIENT_CLINIC_OR_DEPARTMENT_OTHER): Admitting: Oncology

## 2023-10-10 ENCOUNTER — Other Ambulatory Visit: Payer: Self-pay | Admitting: Oncology

## 2023-10-10 ENCOUNTER — Inpatient Hospital Stay: Attending: Oncology

## 2023-10-10 VITALS — BP 114/98 | HR 71 | Temp 97.8°F | Resp 14 | Ht <= 58 in | Wt 94.6 lb

## 2023-10-10 DIAGNOSIS — K31819 Angiodysplasia of stomach and duodenum without bleeding: Secondary | ICD-10-CM | POA: Diagnosis not present

## 2023-10-10 DIAGNOSIS — R3911 Hesitancy of micturition: Secondary | ICD-10-CM

## 2023-10-10 DIAGNOSIS — K635 Polyp of colon: Secondary | ICD-10-CM | POA: Diagnosis not present

## 2023-10-10 DIAGNOSIS — D500 Iron deficiency anemia secondary to blood loss (chronic): Secondary | ICD-10-CM

## 2023-10-10 DIAGNOSIS — D509 Iron deficiency anemia, unspecified: Secondary | ICD-10-CM | POA: Insufficient documentation

## 2023-10-10 DIAGNOSIS — Z87440 Personal history of urinary (tract) infections: Secondary | ICD-10-CM | POA: Insufficient documentation

## 2023-10-10 LAB — URINALYSIS, COMPLETE (UACMP) WITH MICROSCOPIC
Bilirubin Urine: NEGATIVE
Glucose, UA: NEGATIVE mg/dL
Hgb urine dipstick: NEGATIVE
Ketones, ur: NEGATIVE mg/dL
Nitrite: NEGATIVE
Protein, ur: NEGATIVE mg/dL
Specific Gravity, Urine: 1.015 (ref 1.005–1.030)
pH: 8 (ref 5.0–8.0)

## 2023-10-10 LAB — CMP (CANCER CENTER ONLY)
ALT: 26 U/L (ref 0–44)
AST: 27 U/L (ref 15–41)
Albumin: 4.4 g/dL (ref 3.5–5.0)
Alkaline Phosphatase: 53 U/L (ref 38–126)
Anion gap: 10 (ref 5–15)
BUN: 13 mg/dL (ref 8–23)
CO2: 28 mmol/L (ref 22–32)
Calcium: 9.8 mg/dL (ref 8.9–10.3)
Chloride: 94 mmol/L — ABNORMAL LOW (ref 98–111)
Creatinine: 0.65 mg/dL (ref 0.44–1.00)
GFR, Estimated: >60 mL/min (ref >60)
Glucose, Bld: 100 mg/dL — ABNORMAL HIGH (ref 70–99)
Potassium: 3.5 mmol/L (ref 3.5–5.1)
Sodium: 132 mmol/L — ABNORMAL LOW (ref 135–145)
Total Bilirubin: 0.4 mg/dL (ref 0.0–1.2)
Total Protein: 7 g/dL (ref 6.5–8.1)

## 2023-10-10 LAB — CBC WITH DIFFERENTIAL (CANCER CENTER ONLY)
Abs Immature Granulocytes: 0.04 K/uL (ref 0.00–0.07)
Basophils Absolute: 0.1 K/uL (ref 0.0–0.1)
Basophils Relative: 1 %
Eosinophils Absolute: 0.3 K/uL (ref 0.0–0.5)
Eosinophils Relative: 3 %
HCT: 41 % (ref 36.0–46.0)
Hemoglobin: 13.9 g/dL (ref 12.0–15.0)
Immature Granulocytes: 0 %
Lymphocytes Relative: 31 %
Lymphs Abs: 3.2 K/uL (ref 0.7–4.0)
MCH: 31.1 pg (ref 26.0–34.0)
MCHC: 33.9 g/dL (ref 30.0–36.0)
MCV: 91.7 fL (ref 80.0–100.0)
Monocytes Absolute: 0.8 K/uL (ref 0.1–1.0)
Monocytes Relative: 8 %
Neutro Abs: 6 K/uL (ref 1.7–7.7)
Neutrophils Relative %: 57 %
Platelet Count: 313 K/uL (ref 150–400)
RBC: 4.47 MIL/uL (ref 3.87–5.11)
RDW: 13.1 % (ref 11.5–15.5)
WBC Count: 10.3 K/uL (ref 4.0–10.5)
nRBC: 0 % (ref 0.0–0.2)

## 2023-10-10 LAB — IRON AND TIBC
Iron: 85 ug/dL (ref 28–170)
Saturation Ratios: 26 % (ref 10.4–31.8)
TIBC: 333 ug/dL (ref 250–450)
UIBC: 248 ug/dL

## 2023-10-10 LAB — FERRITIN: Ferritin: 312 ng/mL — ABNORMAL HIGH (ref 11–307)

## 2023-10-11 ENCOUNTER — Telehealth: Payer: Self-pay

## 2023-10-11 NOTE — Telephone Encounter (Signed)
  Latest Reference Range & Units 10/10/23 13:38   Iron  28 - 170 ug/dL 85  UIBC ug/dL 751  TIBC 749 - 549 ug/dL 666  Saturation Ratios 10.4 - 31.8 % 26  Ferritin 11 - 307 ng/mL 312 (H  Dr Ezzard: A 73 y.o. female with a history of iron  deficiency anemia.  When evaluating her labs today, I am very pleased with her hemoglobin and iron  levels.  From a hematologic standpoint, the patient is doing well.  As that is the case, I will see her back in 6 months for repeat clinical assessment.

## 2023-10-12 LAB — URINE CULTURE: Culture: <10000 — AB

## 2023-10-15 ENCOUNTER — Other Ambulatory Visit: Payer: Self-pay | Admitting: Family Medicine

## 2023-10-15 DIAGNOSIS — F411 Generalized anxiety disorder: Secondary | ICD-10-CM

## 2023-10-15 NOTE — Telephone Encounter (Signed)
 Copied from CRM [id number]. Topic: Clinical - Medication Refill >> Oct 15, 2023  2:46 PM Amber H wrote: Medication: FLUoxetine  (PROZAC ) 20 MG capsule  Has the patient contacted their pharmacy? No,  pharmacy called in and  stated that patient was requesting advance approval to prevent missed doses.  (Agent: If no, request that the patient contact the pharmacy for the refill. If patient does not wish to contact the pharmacy document the reason why and proceed with request.) (Agent: If yes, when and what did the pharmacy advise?)  This is the patient's preferred pharmacy:  [id number] [place], [place] - [address] AT [facility] [address] [place] [address] Phone: [phone] Fax: [phone]   Is this the correct pharmacy for this prescription? Yes If no, delete pharmacy and type the correct one.   Has the prescription been filled recently? Yes  Is the patient out of the medication? No, but wants refill before the holidays   Has the patient been seen for an appointment in the last year OR does the patient have an upcoming appointment? Yes  Can we respond through MyChart? No  Agent: Please be advised that Rx refills may take up to 3 business days. We ask that you follow-up with your pharmacy.

## 2023-10-15 NOTE — Telephone Encounter (Unsigned)
 Copied from CRM #[id number]. Topic: Clinical - Medication Refill >> Oct 15, 2023  2:58 PM Amber H wrote: Medication: FLUoxetine  (PROZAC ) 20 MG capsule  Has the patient contacted their pharmacy? No, pharmacy called and stated patient was requesting advance approval for this med to prevent missed doses. (Agent: If no, request that the patient contact the pharmacy for the refill. If patient does not wish to contact the pharmacy document the reason why and proceed with request.) (Agent: If yes, when and what did the pharmacy advise?)  This is the patient's preferred pharmacy:  [id number] [place], [place] - [address] AT [facility] [address] [place] [address] Phone: [phone] Fax: [phone]  Is this the correct pharmacy for this prescription? Yes If no, delete pharmacy and type the correct one.   Has the prescription been filled recently? Yes  Is the patient out of the medication? No, however wants to refill before the holidays.  Has the patient been seen for an appointment in the last year OR does the patient have an upcoming appointment? Yes  Can we respond through MyChart? No  Agent: Please be advised that Rx refills may take up to 3 business days. We ask that you follow-up with your pharmacy.

## 2023-10-16 MED ORDER — FLUOXETINE HCL 20 MG PO CAPS: 60.0000 mg | ORAL_CAPSULE | Freq: Every day | ORAL | 1 refills | Status: AC

## 2023-10-17 ENCOUNTER — Other Ambulatory Visit: Payer: Self-pay | Admitting: Family Medicine

## 2023-10-17 DIAGNOSIS — Z87442 Personal history of urinary calculi: Secondary | ICD-10-CM | POA: Diagnosis not present

## 2023-10-17 DIAGNOSIS — R399 Unspecified symptoms and signs involving the genitourinary system: Secondary | ICD-10-CM | POA: Diagnosis not present

## 2023-10-17 DIAGNOSIS — N201 Calculus of ureter: Secondary | ICD-10-CM | POA: Diagnosis not present

## 2023-10-17 DIAGNOSIS — I10 Essential (primary) hypertension: Secondary | ICD-10-CM

## 2023-10-21 ENCOUNTER — Other Ambulatory Visit: Payer: Self-pay | Admitting: Family Medicine

## 2023-10-23 DIAGNOSIS — L219 Seborrheic dermatitis, unspecified: Secondary | ICD-10-CM | POA: Diagnosis not present

## 2023-11-04 ENCOUNTER — Ambulatory Visit: Admitting: Family Medicine

## 2023-11-04 ENCOUNTER — Encounter: Payer: Self-pay | Admitting: Family Medicine

## 2023-11-04 VITALS — BP 102/56 | HR 61 | Temp 97.4°F | Resp 14 | Ht <= 58 in | Wt 95.0 lb

## 2023-11-04 DIAGNOSIS — M545 Low back pain, unspecified: Secondary | ICD-10-CM | POA: Diagnosis not present

## 2023-11-04 DIAGNOSIS — E039 Hypothyroidism, unspecified: Secondary | ICD-10-CM

## 2023-11-04 DIAGNOSIS — J410 Simple chronic bronchitis: Secondary | ICD-10-CM

## 2023-11-04 DIAGNOSIS — M8000XD Age-related osteoporosis with current pathological fracture, unspecified site, subsequent encounter for fracture with routine healing: Secondary | ICD-10-CM | POA: Diagnosis not present

## 2023-11-04 DIAGNOSIS — E119 Type 2 diabetes mellitus without complications: Secondary | ICD-10-CM | POA: Diagnosis not present

## 2023-11-04 DIAGNOSIS — N3000 Acute cystitis without hematuria: Secondary | ICD-10-CM | POA: Diagnosis not present

## 2023-11-04 DIAGNOSIS — I10 Essential (primary) hypertension: Secondary | ICD-10-CM | POA: Diagnosis not present

## 2023-11-04 DIAGNOSIS — Z23 Encounter for immunization: Secondary | ICD-10-CM | POA: Diagnosis not present

## 2023-11-04 DIAGNOSIS — R82998 Other abnormal findings in urine: Secondary | ICD-10-CM

## 2023-11-04 DIAGNOSIS — E782 Mixed hyperlipidemia: Secondary | ICD-10-CM | POA: Diagnosis not present

## 2023-11-04 LAB — POCT URINALYSIS DIP (CLINITEK)
Blood, UA: NEGATIVE
Glucose, UA: NEGATIVE mg/dL
Ketones, POC UA: NEGATIVE mg/dL
Nitrite, UA: NEGATIVE
Spec Grav, UA: 1.01 (ref 1.010–1.025)
Urobilinogen, UA: 0.2 U/dL
pH, UA: 8 (ref 5.0–8.0)

## 2023-11-04 NOTE — Assessment & Plan Note (Signed)
 Management per specialist.

## 2023-11-04 NOTE — Assessment & Plan Note (Addendum)
 Hypertension: low normal.  Diabetes well controlled.  Hypertension management complicated by low blood pressure and possible dehydration. Carvedilol  discontinued due to low blood pressure, fatigue, and low heart rate. - Discontinue carvedilol . - Continue chlorthalidone  25 mg daily.  Orders:   Hemoglobin A1c   Lipid panel   POCT URINALYSIS DIP (CLINITEK)   Microalbumin / creatinine urine ratio

## 2023-11-04 NOTE — Assessment & Plan Note (Addendum)
 Send for culture.  Orders:   Urine Culture

## 2023-11-04 NOTE — Assessment & Plan Note (Signed)
 Progressive. Recommended smoking cessation. Refused.  Continue trelegy one inhalation daily.  Use albuterol  2 puffs four times a day as needed.

## 2023-11-04 NOTE — Assessment & Plan Note (Signed)
 Prolia given

## 2023-11-04 NOTE — Patient Instructions (Signed)
  VISIT SUMMARY: During your visit, we discussed your increased fatigue, balance issues, back pain, and other symptoms. We reviewed your medications and made some adjustments to better manage your conditions.  YOUR PLAN: HYPERTENSION: Your blood pressure management is complicated by low blood pressure and possible dehydration. -Stop taking carvedilol .  MENIERE'S DISEASE: You have balance issues that may be related to your Meniere's disease. -Continue current management for Meniere's disease.  CHRONIC PAIN: You experience daily back pain, which may be contributing to your fatigue. -Continue current pain management strategies.  MIGRAINE: You had severe migraines after your first Emgality dose. -Continue Emgality injections. -Use Maxalt  as needed for acute migraine attacks.  DEPRESSION: Your depression is managed with Prozac . -Continue taking Prozac  60 mg daily.  HYPOTHYROIDISM: You reported increased sleepiness, so we need to check your thyroid  function. -Get a thyroid  function test.  HYPERLIPIDEMIA: Routine monitoring of your cholesterol levels is needed. -Get a lipid panel test.  GASTROESOPHAGEAL REFLUX DISEASE: Your acid reflux is managed with famotidine  and pantoprazole . -Continue taking famotidine . -Continue taking pantoprazole .  GENERAL HEALTH MAINTENANCE: Routine diabetes screening is due. -Get an A1c test.                      Contains text generated by [employer]bridge.                                 Contains text generated by [employer]bridge.

## 2023-11-04 NOTE — Assessment & Plan Note (Signed)
 Lipid panel due for routine monitoring. - Order lipid panel.

## 2023-11-04 NOTE — Progress Notes (Signed)
 Subjective:  Patient ID: Allison Jimenez, female    DOB: 09/09/1950  Age: 73 y.o. MRN: [id number]  Chief Complaint  Patient presents with   Medical Management of Chronic Issues    HPI: Discussed the use of AI scribe software for clinical note transcription with the patient, who gave verbal consent to proceed.  History of Present Illness Allison Jimenez is a 73 year old female with hypertension and Meniere's disease who presents with fatigue and medication review.  Fatigue and somnolence - Increased fatigue, particularly in the evenings - Sensation of extreme sleepiness  - Currently taking carvedilol  (half a pill twice daily) and chlorthalidone  25 mg daily as part of antihypertensive regimen - Frequently feels cold, which she associates with changing weather  Balance disturbance and meniere's disease - History of Meniere's disease - Balance affected when getting out of the car on a hill - Chlorthalidone  25 mg daily has been used for years, initially prescribed to help with ear issues  Back pain and neuropathy - Daily back pain - Persistent numbness in the right foot since hospitalization two years ago - Attributes numbness and back pain to prior hospital stay and possible underlying back issues  Allergic symptoms and hoarseness - Recent exacerbation of allergies affecting vocal cords - Hoarseness and episode of only being able to whisper, improved after vocal rest  Ocular symptoms - Uses Tyrvaya for dry eyes - Eyes sometimes feel excessively watery       10/10/2023    2:00 PM 07/03/2023   10:55 AM 03/27/2023   11:03 AM 12/24/2022   11:37 AM 09/27/2022    4:21 PM  Depression screen PHQ 2/9  Decreased Interest 0 1 2 0 1  Down, Depressed, Hopeless 0 0 1 0 1  PHQ - 2 Score 0 1 3 0 2  Altered sleeping  2 1 0   Tired, decreased energy  1 2 0   Change in appetite  0 1 0   Feeling bad or failure about yourself   0 1 0   Trouble concentrating  0 0 0   Moving slowly or  fidgety/restless  0 1 0   Suicidal thoughts  0 1 0   PHQ-9 Score  4 10 0   Difficult doing work/chores  Not difficult at all Somewhat difficult Not difficult at all         07/03/2023   10:54 AM  Fall Risk   Falls in the past year? 0  Number falls in past yr: 0  Injury with Fall? 0  Risk for fall due to : Impaired balance/gait  Follow up Falls evaluation completed    Patient Care Team: Sherre Clapper, MD as PCP - General (Family Medicine) Ruthell Lauraine FALCON, NP as Nurse Practitioner (Pulmonary Disease) Livingston No, PA-C as Physician Assistant (General Practice) Misenheimer, Evalene, MD as Consulting Physician (Gastroenterology) Waylan Cain, MD as Consulting Physician (Ophthalmology) Rudy Cain CHRISTELLA DEVONNA (Physician Assistant) Gladis Leonor CHRISTELLA, MD as Consulting Physician (Pulmonary Disease) Ezzard Valaria LABOR, MD as Consulting Physician (Oncology) Pandora Cadet, [facility] (Pharmacist)   Review of Systems  Constitutional:  Positive for fatigue. Negative for chills and fever.  HENT:  Positive for voice change. Negative for congestion, ear pain and sore throat.   Respiratory:  Negative for cough and shortness of breath.   Cardiovascular:  Negative for chest pain.  Gastrointestinal:  Negative for abdominal pain, constipation, diarrhea, nausea and vomiting.  Genitourinary:  Negative for dysuria and urgency.  Musculoskeletal:  Positive  for back pain. Negative for arthralgias and myalgias.  Skin:  Negative for rash.  Neurological:  Negative for dizziness and headaches.  Psychiatric/Behavioral:  Negative for dysphoric mood. The patient is not nervous/anxious.     Current Outpatient Medications on File Prior to Visit  Medication Sig Dispense Refill   acetaminophen  (TYLENOL ) 500 MG tablet Take 500-1,000 mg by mouth every 6 (six) hours as needed for mild pain, moderate pain or headache.     albuterol  (VENTOLIN  HFA) 108 (90 Base) MCG/ACT inhaler Inhale 2 puffs into the lungs every 6 (six) hours  as needed for shortness of breath or wheezing. 8 g 2   atorvastatin  (LIPITOR) 20 MG tablet TAKE 1 TABLET(20 MG) BY MOUTH DAILY 90 tablet 1   calcitonin, salmon, (MIACALCIN /FORTICAL) 200 UNIT/ACT nasal spray Place 1 spray into alternate nostrils daily.     Carboxymethylcellul-Glycerin (LUBRICATING EYE DROPS OP) Place 1 drop into both eyes daily.     cetirizine (ZYRTEC) 10 MG tablet Take 10 mg by mouth daily as needed for allergies.     chlorthalidone  (HYGROTON ) 25 MG tablet TAKE 1 TABLET(25 MG) BY MOUTH DAILY 90 tablet 1   Cholecalciferol (VITAMIN D ) 50 MCG (2000 UT) CAPS Take 2,000 Units by mouth daily.     conjugated estrogens  (PREMARIN ) vaginal cream Place 1 applicator vaginally daily as needed (dryness / burning).     cyclobenzaprine  (FLEXERIL ) 10 MG tablet Take 1 tablet (10 mg total) by mouth 3 (three) times daily as needed for muscle spasms. 30 tablet 1   DERMA-SMOOTHE/FS SCALP 0.01 % OIL APPLY 3 NIGHTS PER WEEK FOR 2 WEEKS; THEN 2 NIGHTS PER WEEK FOR 2 WEEKS; THEN USE AS NEEDED. DAMPEN SCALP BEFORE USE.     diclofenac  Sodium (VOLTAREN ) 1 % GEL Apply 1 Application topically 4 (four) times daily as needed (pain).     EMGALITY 120 MG/ML SOAJ      famotidine  (PEPCID ) 40 MG tablet TAKE 1 TABLET(40 MG) BY MOUTH AT BEDTIME 90 tablet 1   fluorometholone (FML) 0.1 % ophthalmic suspension INSTILL 1 DROP INTO LEFT EYE THREE TIMES DAILY FOR 3 DAYS THEN TWICE DAILY FOR 3 DAYS THEN EVERY DAY FOR 3 DAYS THEN STOP     FLUoxetine  (PROZAC ) 20 MG capsule Take 3 capsules (60 mg total) by mouth daily. 270 capsule 1   fluticasone  (FLONASE ) 50 MCG/ACT nasal spray Place 2 sprays into both nostrils daily. SHAKE LIQUID AND USE 2 SPRAYS IN EACH NOSTRIL DAILY 16 g 6   Fluticasone -Umeclidin-Vilant (TRELEGY ELLIPTA ) 100-62.5-25 MCG/ACT AEPB INHALE 1 PUFF INTO THE LUNGS DAILY 180 each 3   ketoconazole  (NIZORAL ) 2 % shampoo Apply 1 Application topically 2 (two) times a week. 120 mL 0   levothyroxine  (SYNTHROID ) 25 MCG  tablet TAKE 1 TABLET(25 MCG) BY MOUTH DAILY 90 tablet 0   montelukast  (SINGULAIR ) 10 MG tablet TAKE 1 TABLET(10 MG) BY MOUTH AT BEDTIME 90 tablet 3   nystatin -triamcinolone  ointment (MYCOLOG) Apply 1 Application topically 2 (two) times daily. 30 g 0   ondansetron  (ZOFRAN ) 4 MG tablet Take 1 tablet (4 mg total) by mouth every 8 (eight) hours as needed for nausea or vomiting. 10 tablet 0   OVER THE COUNTER MEDICATION Take 1 tablet by mouth in the morning. Salt Supplement     oxyCODONE -acetaminophen  (PERCOCET) 10-325 MG tablet Take 1 tablet by mouth every 8 (eight) hours as needed for pain.     pantoprazole  (PROTONIX ) 40 MG tablet TAKE 1 TABLET(40 MG) BY MOUTH DAILY 90 tablet 0  polyethylene glycol (MIRALAX  / GLYCOLAX ) 17 g packet Take 17 g by mouth daily.     potassium chloride  (MICRO-K ) 10 MEQ CR capsule TAKE 2 CAPSULES(20 MEQ) BY MOUTH TWICE DAILY 360 capsule 1   pregabalin  (LYRICA ) 25 MG capsule TAKE 1 CAPSULE(25 MG) BY MOUTH AT BEDTIME 90 capsule 0   PROLIA  60 MG/ML SOSY injection Inject into the skin.     promethazine  (PHENERGAN ) 25 MG tablet TAKE 1 TABLET BY MOUTH EVERY 8 HOURS IF NEEDED 20 tablet 0   rizatriptan  (MAXALT ) 10 MG tablet Take 10 mg by mouth as needed for migraine.     TYRVAYA 0.03 MG/ACT SOLN Place 1 spray into both nostrils 2 (two) times daily.     vitamin B-12 (CYANOCOBALAMIN ) 500 MCG tablet Take 500 mcg by mouth daily at 12 noon.     carboxymethylcellul-glycerin (REFRESH RELIEVA) 0.5-0.9 % ophthalmic solution Apply 1 drop to eye.     Current Facility-Administered Medications on File Prior to Visit  Medication Dose Route Frequency Provider Last Rate Last Admin   [START ON 01/06/2024] denosumab  (PROLIA ) injection 60 mg  60 mg Subcutaneous Once        Past Medical History:  Diagnosis Date   Acute blood loss anemia (ABLA) 09/05/2020   Acute metabolic encephalopathy 07/28/2022   Allergy    ANXIETY 06/05/2006   Arthritis    SHOULDERS    BACK PAIN 01/05/2010   Carotid  artery occlusion    Cataract    bilateral, left worse   Centrilobular emphysema (HCC)    COPD (chronic obstructive pulmonary disease) (HCC)    Depression    DIVERTICULOSIS, COLON 06/05/2006   DIZZINESS OR VERTIGO 06/05/2006   positional   GERD (gastroesophageal reflux disease)    Headache    Migraines   Hearing loss in left ear    no hearing aid   History of blood transfusion 08/2020   History of kidney stones    passed stones   HYPERLIPIDEMIA 06/05/2006   Hypertension    HYPOKALEMIA 12/14/2008   Hypothyroidism    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   Qualifier: Diagnosis of  By: Jame  MD, Maude FALCON    Pneumonia    Pre-diabetes    TOBACCO USER 11/25/2007   Past Surgical History:  Procedure Laterality Date   APPENDECTOMY     bilateral cataract surgery      BRONCHIAL BIOPSY  09/28/2021   Procedure: BRONCHIAL BIOPSIES;  Surgeon: Gladis Leonor HERO, MD;  Location: [facility] ENDOSCOPY;  Service: Pulmonary;;   BRONCHIAL NEEDLE ASPIRATION BIOPSY  09/28/2021   Procedure: BRONCHIAL NEEDLE ASPIRATION BIOPSIES;  Surgeon: Gladis Leonor HERO, MD;  Location: [facility] ENDOSCOPY;  Service: Pulmonary;;   BRONCHIAL WASHINGS  09/28/2021   Procedure: BRONCHIAL WASHINGS;  Surgeon: Gladis Leonor HERO, MD;  Location: [facility] ENDOSCOPY;  Service: Pulmonary;;   CESAREAN SECTION     x1   CHOLECYSTECTOMY     COLONOSCOPY     COSMETIC SURGERY     CYSTOSCOPY W/ URETERAL STENT PLACEMENT Right 07/29/2022   Procedure: CYSTOSCOPY WITH RETROGRADE PYELOGRAM/URETERAL STENT PLACEMENT;  Surgeon: Sherrilee Belvie CROME, MD;  Location: [facility] ORS;  Service: Urology;  Laterality: Right;   CYSTOSCOPY/URETEROSCOPY/HOLMIUM LASER/STENT PLACEMENT Right 09/05/2022   Procedure: RIGHT URETEROSCOPY/HOLMIUM LASER/STENT PLACEMENT;  Surgeon: Lovie Arlyss CROME, MD;  Location: [facility] ORS;  Service: Urology;  Laterality: Right;   mastoid surgery  1992   shunt in mastoid- endolymphatic sac in left ear    MRI  07/11/2020   x several, last one located in CE  REVERSE SHOULDER ARTHROPLASTY Right 01/24/2023   Procedure: REVERSE SHOULDER ARTHROPLASTY;  Surgeon: Melita Drivers, MD;  Location: [facility] ORS;  Service: Orthopedics;  Laterality: Right;  120 min   TUBAL LIGATION     UPPER GI ENDOSCOPY     vocal cord surgery     nodule x2    Family History  Problem Relation Age of Onset   Breast cancer Mother    Dementia Mother    Heart disease Mother    Hypertension Mother    Thyroid  disease Mother    Alzheimer's disease Father    Heart disease Father    Hypertension Father    Thyroid  disease Father    Diabetes Sister    Colon cancer Neg Hx    Rectal cancer Neg Hx    Stomach cancer Neg Hx    Colon polyps Neg Hx    Esophageal cancer Neg Hx    Social History   Socioeconomic History   Marital status: Divorced    Spouse name: Not on file   Number of children: 1   Years of education: 13   Highest education level: Not on file  Occupational History   Occupation: ASSISTANT CREDIT MANAGER    Employer: [employer]    Comment: Retired  Tobacco Use   Smoking status: Every Day    Current packs/day: 0.50    Average packs/day: 0.5 packs/day for 49.0 years (24.5 ttl pk-yrs)    Types: Cigarettes   Smokeless tobacco: Never   Tobacco comments:    3/4 ppd 09/18/23 Marcus HERO CMA  Vaping Use   Vaping status: Former   Start date: 01/01/2010   Quit date: 08/01/2021   Substances: Nicotine    Devices: menthol/fruit flavor  Substance and Sexual Activity   Alcohol  use: No    Alcohol /week: 0.0 standard drinks of alcohol    Drug use: No   Sexual activity: Not Currently    Birth control/protection: Post-menopausal  Other Topics Concern   Not on file  Social History Narrative   Patient lives at home alone. Patient has a biochemist, clinical. Patient is divorced .   Patient is retired.   Education high school.   Right handed.   Caffeine none.   Social Drivers of Corporate Investment Banker Strain: Low Risk  (05/23/2023)   Received from [facility]   Overall Financial Resource Strain (CARDIA)    Difficulty of Paying Living Expenses: Not hard at all  Food Insecurity: No Food Insecurity (05/23/2023)   [facility]   Hunger Vital Sign    Within the past 12 months, you worried that your food would run out before you got the money to buy more.: Never true    Within the past 12 months, the food you bought just didn't last and you didn't have money to get more.: Never true  Transportation Needs: No Transportation Needs (05/23/2023)   Received from [facility] - Transportation    Lack of Transportation (Medical): No    Lack of Transportation (Non-Medical): No  Physical Activity: Inactive (10/31/2022)   Exercise Vital Sign    Days of Exercise per Week: 0 days    Minutes of Exercise per Session: 0 min  Stress: No Stress Concern Present (08/21/2023)   [employer] of Occupational Health - Occupational Stress Questionnaire    Feeling of Stress: Not at all  Social Connections: Socially Isolated (08/21/2023)   Social Connection and Isolation Panel    Frequency of Communication with  Friends and Family: Three times a week    Frequency of Social Gatherings with Friends and Family: Three times a week    Attends Religious Services: Never    Active Member of Clubs or Organizations: No    Attends Banker Meetings: Never    Marital Status: Divorced    Objective:  BP (!) 102/56   Pulse 61   Temp (!) 97.4 F (36.3 C)   Resp 14   Ht 4' 10 (1.473 m)   Wt 95 lb (43.1 kg)   SpO2 97%   BMI 19.86 kg/m      11/04/2023   10:41 AM 10/10/2023    2:40 PM 09/18/2023    2:53 PM  BP/Weight  Systolic BP 102 114 132  Diastolic BP 56 98 68  Wt. (Lbs) 95 94.6 94.4  BMI 19.86 kg/m2 19.77 kg/m2 19.73 kg/m2    Physical Exam Vitals reviewed.  Constitutional:      Appearance: Normal appearance.     Comments: thin  Neck:     Vascular: No carotid bruit.  Cardiovascular:     Rate and Rhythm: Normal rate  and regular rhythm.     Heart sounds: Normal heart sounds.  Pulmonary:     Effort: Pulmonary effort is normal. No respiratory distress.     Breath sounds: Normal breath sounds.  Abdominal:     General: Abdomen is flat. Bowel sounds are normal.     Palpations: Abdomen is soft.     Tenderness: There is no abdominal tenderness.  Neurological:     Mental Status: She is alert and oriented to person, place, and time.  Psychiatric:        Mood and Affect: Mood normal.        Behavior: Behavior normal.         Lab Results  Component Value Date   WBC 10.3 10/10/2023   HGB 13.9 10/10/2023   HCT 41.0 10/10/2023   PLT 313 10/10/2023   GLUCOSE 100 (H) 10/10/2023   CHOL 104 03/27/2023   TRIG 102 03/27/2023   HDL 41 03/27/2023   LDLDIRECT 151.9 09/13/2011   LDLCALC 44 03/27/2023   ALT 26 10/10/2023   AST 27 10/10/2023   NA 132 (L) 10/10/2023   K 3.5 10/10/2023   CL 94 (L) 10/10/2023   CREATININE 0.65 10/10/2023   BUN 13 10/10/2023   CO2 28 10/10/2023   TSH 1.980 06/13/2023   INR 1.1 07/29/2022   HGBA1C 5.1 03/27/2023    Results for orders placed or performed in visit on 11/04/23  POCT URINALYSIS DIP (CLINITEK)   Collection Time: 11/04/23 11:35 AM  Result Value Ref Range   Color, UA yellow yellow   Clarity, UA clear clear   Glucose, UA negative negative mg/dL   Bilirubin, UA small (A) negative   Ketones, POC UA negative negative mg/dL   Spec Grav, UA 8.989 8.989 - 1.025   Blood, UA negative negative   pH, UA 8.0 5.0 - 8.0   POC PROTEIN,UA trace negative, trace   Urobilinogen, UA 0.2 0.2 or 1.0 E.U./dL   Nitrite, UA Negative Negative   Leukocytes, UA Small (1+) (A) Negative  .  Assessment & Plan:   Assessment & Plan Hypertension complicating diabetes (HCC) Hypertension: low normal.  Diabetes well controlled.  Hypertension management complicated by low blood pressure and possible dehydration. Carvedilol  discontinued due to low blood pressure, fatigue, and low heart  rate. - Discontinue carvedilol . - Continue chlorthalidone  25 mg daily.  Orders:   Hemoglobin A1c   Lipid panel   POCT URINALYSIS DIP (CLINITEK)   Microalbumin / creatinine urine ratio  Hypothyroidism (acquired) Check due to fatigue.  Previously therapeutic. Orders:   T4, free   TSH  Urine WBC increased Send for culture.  Orders:   Urine Culture  Encounter for immunization  Orders:   Flu vaccine HIGH DOSE PF(Fluzone Trivalent)  Encounter for immunization  Orders:   Pfizer Comirnaty Covid-19 Vaccine 62yrs & older  Lumbar back pain Management per specialist.       Simple chronic bronchitis (HCC) Progressive. Recommended smoking cessation. Refused.  Continue trelegy one inhalation daily.  Use albuterol  2 puffs four times a day as needed.       Age-related osteoporosis with current pathological fracture with routine healing, subsequent encounter Prolia  given.    Mixed hyperlipidemia Lipid panel due for routine monitoring. - Order lipid panel.      Body mass index is 19.86 kg/m.    No orders of the defined types were placed in this encounter.   Orders Placed This Encounter  Procedures   Urine Culture   Flu vaccine HIGH DOSE PF(Fluzone Trivalent)   Pfizer Comirnaty Covid-19 Vaccine 41yrs & older   T4, free   TSH   Hemoglobin A1c   Lipid panel   Microalbumin / creatinine urine ratio   POCT URINALYSIS DIP (CLINITEK)       Follow-up: Return in about 3 months (around 02/04/2024) for chronic follow up.  An After Visit Summary was printed and given to the patient.  Abigail Free, MD [facility] [phone]

## 2023-11-04 NOTE — Assessment & Plan Note (Deleted)
 Allison Jimenez

## 2023-11-04 NOTE — Assessment & Plan Note (Addendum)
 Check due to fatigue.  Previously therapeutic. Orders:   T4, free   TSH

## 2023-11-05 ENCOUNTER — Other Ambulatory Visit: Payer: Self-pay | Admitting: Family Medicine

## 2023-11-05 ENCOUNTER — Ambulatory Visit: Payer: Self-pay | Admitting: Family Medicine

## 2023-11-05 ENCOUNTER — Other Ambulatory Visit: Payer: Self-pay | Admitting: Physician Assistant

## 2023-11-05 LAB — LIPID PANEL
Chol/HDL Ratio: 3 ratio (ref 0.0–4.4)
Cholesterol, Total: 112 mg/dL (ref 100–199)
HDL: 37 mg/dL — ABNORMAL LOW (ref >39)
LDL Chol Calc (NIH): 54 mg/dL (ref 0–99)
Triglycerides: 116 mg/dL (ref 0–149)
VLDL Cholesterol Cal: 21 mg/dL (ref 5–40)

## 2023-11-05 LAB — MICROALBUMIN / CREATININE URINE RATIO
Creatinine, Urine: 92.5 mg/dL
Microalb/Creat Ratio: 43 mg/g{creat} — ABNORMAL HIGH (ref 0–29)
Microalbumin, Urine: 39.8 ug/mL

## 2023-11-05 LAB — T4, FREE: Free T4: 1.16 ng/dL (ref 0.82–1.77)

## 2023-11-05 LAB — HEMOGLOBIN A1C
Est. average glucose Bld gHb Est-mCnc: 117 mg/dL
Hgb A1c MFr Bld: 5.7 % — ABNORMAL HIGH (ref 4.8–5.6)

## 2023-11-05 LAB — TSH: TSH: 3.4 u[IU]/mL (ref 0.450–4.500)

## 2023-11-07 LAB — URINE CULTURE

## 2023-11-07 MED ORDER — CIPROFLOXACIN HCL 250 MG PO TABS: 250.0000 mg | ORAL_TABLET | Freq: Two times a day (BID) | ORAL | 0 refills | Status: AC

## 2023-11-08 DIAGNOSIS — H04122 Dry eye syndrome of left lacrimal gland: Secondary | ICD-10-CM | POA: Diagnosis not present

## 2023-11-16 ENCOUNTER — Other Ambulatory Visit: Payer: Self-pay | Admitting: Family Medicine

## 2023-11-16 DIAGNOSIS — E039 Hypothyroidism, unspecified: Secondary | ICD-10-CM

## 2023-11-18 DIAGNOSIS — M419 Scoliosis, unspecified: Secondary | ICD-10-CM | POA: Diagnosis not present

## 2023-11-18 DIAGNOSIS — M546 Pain in thoracic spine: Secondary | ICD-10-CM | POA: Diagnosis not present

## 2023-11-18 DIAGNOSIS — G8929 Other chronic pain: Secondary | ICD-10-CM | POA: Diagnosis not present

## 2023-11-18 DIAGNOSIS — M791 Myalgia, unspecified site: Secondary | ICD-10-CM | POA: Diagnosis not present

## 2023-11-18 DIAGNOSIS — M545 Low back pain, unspecified: Secondary | ICD-10-CM | POA: Diagnosis not present

## 2023-11-22 ENCOUNTER — Other Ambulatory Visit: Payer: Self-pay | Admitting: Family Medicine

## 2023-11-22 ENCOUNTER — Ambulatory Visit

## 2023-11-22 DIAGNOSIS — E039 Hypothyroidism, unspecified: Secondary | ICD-10-CM

## 2023-11-25 NOTE — Progress Notes (Signed)
 Acute Office Visit  Subjective:    Patient ID: Allison Jimenez, female    DOB: 1950/02/09, 73 y.o.   MRN: [id number]  Chief Complaint  Patient presents with   Sore throat/sinuses x2 weeks    Discussed the use of AI scribe software for clinical note transcription with the patient, who gave verbal consent to proceed.  History of Present Illness Allison Jimenez is a 73 year old female with emphysema who presents with a persistent cough and respiratory symptoms.  Cough and respiratory symptoms - Persistent cough for at least two weeks with fluctuating severity - Cough has worsened and is producing increased phlegm compared to baseline - Shortness of breath present - No chest pain - Possible fevers, described as 'heat flashes' - History of emphysema and significant drainage issues - Not currently taking prescription medication for cough, using over-the-counter options  Blood pressure and cardiovascular symptoms - Antihypertensive medication discontinued due to consistently low blood pressure readings - Increased energy and decreased sleepiness since stopping medication - Occasional sensation of heart beating harder  Urinary hesitancy - Slow initiation of urination, requiring a few minutes to start - Saw urologist last month and discussed slow urination symptoms  Ocular and nasal symptoms - Severe dry eyes managed with nasal spray, which is helpful - Attempts to stay hydrated - Occasionally gargles with warm salt water  - Takes allergy medication    Past Medical History:  Diagnosis Date   Acute blood loss anemia (ABLA) 09/05/2020   Acute metabolic encephalopathy 07/28/2022   Allergy    ANXIETY 06/05/2006   Arthritis    SHOULDERS    BACK PAIN 01/05/2010   Carotid artery occlusion    Cataract    bilateral, left worse   Centrilobular emphysema (HCC)    COPD (chronic obstructive pulmonary disease) (HCC)    Depression    DIVERTICULOSIS, COLON 06/05/2006   DIZZINESS OR  VERTIGO 06/05/2006   positional   GERD (gastroesophageal reflux disease)    Headache    Migraines   Hearing loss in left ear    no hearing aid   History of blood transfusion 08/2020   History of kidney stones    passed stones   HYPERLIPIDEMIA 06/05/2006   Hypertension    HYPOKALEMIA 12/14/2008   Hypothyroidism    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   Qualifier: Diagnosis of  By: Jame  MD, Maude FALCON    Pneumonia    Pre-diabetes    TOBACCO USER 11/25/2007    Past Surgical History:  Procedure Laterality Date   APPENDECTOMY     bilateral cataract surgery      BRONCHIAL BIOPSY  09/28/2021   Procedure: BRONCHIAL BIOPSIES;  Surgeon: Gladis Leonor HERO, MD;  Location: [facility] ENDOSCOPY;  Service: Pulmonary;;   BRONCHIAL NEEDLE ASPIRATION BIOPSY  09/28/2021   Procedure: BRONCHIAL NEEDLE ASPIRATION BIOPSIES;  Surgeon: Gladis Leonor HERO, MD;  Location: [facility] ENDOSCOPY;  Service: Pulmonary;;   BRONCHIAL WASHINGS  09/28/2021   Procedure: BRONCHIAL WASHINGS;  Surgeon: Gladis Leonor HERO, MD;  Location: [facility] ENDOSCOPY;  Service: Pulmonary;;   CESAREAN SECTION     x1   CHOLECYSTECTOMY     COLONOSCOPY     COSMETIC SURGERY     CYSTOSCOPY W/ URETERAL STENT PLACEMENT Right 07/29/2022   Procedure: CYSTOSCOPY WITH RETROGRADE PYELOGRAM/URETERAL STENT PLACEMENT;  Surgeon: Sherrilee Dover CROME, MD;  Location: [facility] ORS;  Service: Urology;  Laterality: Right;   CYSTOSCOPY/URETEROSCOPY/HOLMIUM LASER/STENT PLACEMENT Right 09/05/2022   Procedure: RIGHT URETEROSCOPY/HOLMIUM LASER/STENT PLACEMENT;  Surgeon: Lovie Molly  L, MD;  Location: [facility] ORS;  Service: Urology;  Laterality: Right;   mastoid surgery  1992   shunt in mastoid- endolymphatic sac in left ear    MRI  07/11/2020   x several, last one located in CE   REVERSE SHOULDER ARTHROPLASTY Right 01/24/2023   Procedure: REVERSE SHOULDER ARTHROPLASTY;  Surgeon: Melita Drivers, MD;  Location: [facility] ORS;  Service: Orthopedics;  Laterality: Right;  120 min   TUBAL LIGATION      UPPER GI ENDOSCOPY     vocal cord surgery     nodule x2    Family History  Problem Relation Age of Onset   Breast cancer Mother    Dementia Mother    Heart disease Mother    Hypertension Mother    Thyroid  disease Mother    Alzheimer's disease Father    Heart disease Father    Hypertension Father    Thyroid  disease Father    Diabetes Sister    Colon cancer Neg Hx    Rectal cancer Neg Hx    Stomach cancer Neg Hx    Colon polyps Neg Hx    Esophageal cancer Neg Hx     Social History   Socioeconomic History   Marital status: Divorced    Spouse name: Not on file   Number of children: 1   Years of education: 13   Highest education level: Not on file  Occupational History   Occupation: ASSISTANT CREDIT MANAGER    Employer: [employer]    Comment: Retired  Tobacco Use   Smoking status: Every Day    Current packs/day: 0.50    Average packs/day: 0.5 packs/day for 49.0 years (24.5 ttl pk-yrs)    Types: Cigarettes   Smokeless tobacco: Never   Tobacco comments:    3/4 ppd 09/18/23 Marcus HERO CMA  Vaping Use   Vaping status: Former   Start date: 01/01/2010   Quit date: 08/01/2021   Substances: Nicotine    Devices: menthol/fruit flavor  Substance and Sexual Activity   Alcohol  use: No    Alcohol /week: 0.0 standard drinks of alcohol    Drug use: No   Sexual activity: Not Currently    Birth control/protection: Post-menopausal  Other Topics Concern   Not on file  Social History Narrative   Patient lives at home alone. Patient has a biochemist, clinical. Patient is divorced .   Patient is retired.   Education high school.   Right handed.   Caffeine none.   Social Drivers of Corporate Investment Banker Strain: Low Risk  (05/23/2023)   Received from [facility]   Overall Financial Resource Strain (CARDIA)    Difficulty of Paying Living Expenses: Not hard at all  Food Insecurity: No Food Insecurity (05/23/2023)   [facility] from [facility]   Hunger Vital Sign     Within the past 12 months, you worried that your food would run out before you got the money to buy more.: Never true    Within the past 12 months, the food you bought just didn't last and you didn't have money to get more.: Never true  Transportation Needs: No Transportation Needs (05/23/2023)   Received from [facility] - Transportation    Lack of Transportation (Medical): No    Lack of Transportation (Non-Medical): No  Physical Activity: Inactive (10/31/2022)   Exercise Vital Sign    Days of Exercise per Week: 0 days    Minutes of Exercise per Session: 0 min  Stress: No Stress Concern Present (08/21/2023)   [employer] of Occupational [employer] - Occupational Stress Questionnaire    Feeling of Stress: Not at all  Social Connections: Socially Isolated (08/21/2023)   Social Connection and Isolation Panel    Frequency of Communication with Friends and Family: Three times a week    Frequency of Social Gatherings with Friends and Family: Three times a week    Attends Religious Services: Never    Active Member of Clubs or Organizations: No    Attends Banker Meetings: Never    Marital Status: Divorced  Catering Manager Violence: Not At Risk (07/03/2023)   Humiliation, Afraid, Rape, and Kick questionnaire    Fear of Current or Ex-Partner: No    Emotionally Abused: No    Physically Abused: No    Sexually Abused: No    Outpatient Medications Prior to Visit  Medication Sig Dispense Refill   acetaminophen  (TYLENOL ) 500 MG tablet Take 500-1,000 mg by mouth every 6 (six) hours as needed for mild pain, moderate pain or headache.     albuterol  (VENTOLIN  HFA) 108 (90 Base) MCG/ACT inhaler Inhale 2 puffs into the lungs every 6 (six) hours as needed for shortness of breath or wheezing. 8 g 2   atorvastatin  (LIPITOR) 20 MG tablet TAKE 1 TABLET(20 MG) BY MOUTH DAILY 90 tablet 1   calcitonin, salmon, (MIACALCIN /FORTICAL) 200 UNIT/ACT nasal spray Place 1 spray into  alternate nostrils daily.     Carboxymethylcellul-Glycerin (LUBRICATING EYE DROPS OP) Place 1 drop into both eyes daily.     carboxymethylcellul-glycerin (REFRESH RELIEVA) 0.5-0.9 % ophthalmic solution Apply 1 drop to eye.     cetirizine (ZYRTEC) 10 MG tablet Take 10 mg by mouth daily as needed for allergies.     chlorthalidone  (HYGROTON ) 25 MG tablet TAKE 1 TABLET(25 MG) BY MOUTH DAILY 90 tablet 1   Cholecalciferol (VITAMIN D ) 50 MCG (2000 UT) CAPS Take 2,000 Units by mouth daily.     conjugated estrogens  (PREMARIN ) vaginal cream Place 1 applicator vaginally daily as needed (dryness / burning).     cyclobenzaprine  (FLEXERIL ) 10 MG tablet Take 1 tablet (10 mg total) by mouth 3 (three) times daily as needed for muscle spasms. 30 tablet 1   DERMA-SMOOTHE/FS SCALP 0.01 % OIL APPLY 3 NIGHTS PER WEEK FOR 2 WEEKS; THEN 2 NIGHTS PER WEEK FOR 2 WEEKS; THEN USE AS NEEDED. DAMPEN SCALP BEFORE USE.     diclofenac  Sodium (VOLTAREN ) 1 % GEL Apply 1 Application topically 4 (four) times daily as needed (pain).     EMGALITY 120 MG/ML SOAJ      famotidine  (PEPCID ) 40 MG tablet TAKE 1 TABLET(40 MG) BY MOUTH AT BEDTIME 90 tablet 1   fluorometholone (FML) 0.1 % ophthalmic suspension INSTILL 1 DROP INTO LEFT EYE THREE TIMES DAILY FOR 3 DAYS THEN TWICE DAILY FOR 3 DAYS THEN EVERY DAY FOR 3 DAYS THEN STOP     FLUoxetine  (PROZAC ) 20 MG capsule Take 3 capsules (60 mg total) by mouth daily. 270 capsule 1   fluticasone  (FLONASE ) 50 MCG/ACT nasal spray Place 2 sprays into both nostrils daily. SHAKE LIQUID AND USE 2 SPRAYS IN EACH NOSTRIL DAILY 16 g 6   Fluticasone -Umeclidin-Vilant (TRELEGY ELLIPTA ) 100-62.5-25 MCG/ACT AEPB INHALE 1 PUFF INTO THE LUNGS DAILY 180 each 3   ketoconazole  (NIZORAL ) 2 % shampoo Apply 1 Application topically 2 (two) times a week. 120 mL 0   levothyroxine  (SYNTHROID ) 25 MCG tablet TAKE 1 TABLET(25 MCG) BY MOUTH DAILY 90 tablet 0  montelukast  (SINGULAIR ) 10 MG tablet TAKE 1 TABLET(10 MG) BY MOUTH AT  BEDTIME 90 tablet 3   nystatin -triamcinolone  ointment (MYCOLOG) Apply 1 Application topically 2 (two) times daily. 30 g 0   ondansetron  (ZOFRAN ) 4 MG tablet Take 1 tablet (4 mg total) by mouth every 8 (eight) hours as needed for nausea or vomiting. 10 tablet 0   OVER THE COUNTER MEDICATION Take 1 tablet by mouth in the morning. Salt Supplement     oxyCODONE -acetaminophen  (PERCOCET) 10-325 MG tablet Take 1 tablet by mouth every 8 (eight) hours as needed for pain.     pantoprazole  (PROTONIX ) 40 MG tablet TAKE 1 TABLET(40 MG) BY MOUTH DAILY 90 tablet 0   polyethylene glycol (MIRALAX  / GLYCOLAX ) 17 g packet Take 17 g by mouth daily.     potassium chloride  (MICRO-K ) 10 MEQ CR capsule TAKE 2 CAPSULES(20 MEQ) BY MOUTH TWICE DAILY 360 capsule 1   pregabalin  (LYRICA ) 25 MG capsule TAKE 1 CAPSULE(25 MG) BY MOUTH AT BEDTIME 90 capsule 1   PROLIA  60 MG/ML SOSY injection Inject into the skin.     promethazine  (PHENERGAN ) 25 MG tablet TAKE 1 TABLET BY MOUTH EVERY 8 HOURS IF NEEDED 20 tablet 0   rizatriptan  (MAXALT ) 10 MG tablet Take 10 mg by mouth as needed for migraine.     TYRVAYA 0.03 MG/ACT SOLN Place 1 spray into both nostrils 2 (two) times daily.     vitamin B-12 (CYANOCOBALAMIN ) 500 MCG tablet Take 500 mcg by mouth daily at 12 noon.     Facility-Administered Medications Prior to Visit  Medication Dose Route Frequency Provider Last Rate Last Admin   [START ON 01/06/2024] denosumab  (PROLIA ) injection 60 mg  60 mg Subcutaneous Once         Allergies  Allergen Reactions   Erythromycin Base Diarrhea   Vfend  [Voriconazole ] Other (See Comments)    Visual changes   Penicillins Rash    Review of Systems  All other systems reviewed and are negative.      Objective:        11/26/2023    3:59 PM 11/04/2023   10:41 AM 10/10/2023    2:40 PM  Vitals with BMI  Height 4' 10 4' 10 4' 10  Weight 96 lbs 3 oz 95 lbs 94 lbs 10 oz  BMI 20.11 19.86 19.78  Systolic 138 102 885  Diastolic 62 56 98   Pulse 92 61 71    No data found.   Physical Exam Vitals reviewed.  Constitutional:      Appearance: Normal appearance.  HENT:     Right Ear: There is impacted cerumen.     Left Ear: Tympanic membrane, ear canal and external ear normal.     Nose: Nose normal.     Right Turbinates: Enlarged, swollen and pale.     Left Turbinates: Enlarged, swollen and pale.     Mouth/Throat:     Pharynx: Oropharynx is clear. Postnasal drip present.  Cardiovascular:     Rate and Rhythm: Normal rate and regular rhythm.     Heart sounds: Normal heart sounds. No murmur heard. Pulmonary:     Effort: Pulmonary effort is normal. No respiratory distress.     Breath sounds: Normal breath sounds.  Lymphadenopathy:     Cervical: No cervical adenopathy.  Neurological:     Mental Status: She is alert and oriented to person, place, and time.  Psychiatric:        Mood and Affect: Mood normal.  Behavior: Behavior normal.     Health Maintenance Due  Topic Date Due   DTaP/Tdap/Td (2 - Td or Tdap) 05/17/2020   FOOT EXAM  09/17/2023   Medicare Annual Wellness (AWV)  10/24/2023    There are no preventive care reminders to display for this patient.   Lab Results  Component Value Date   TSH 3.400 11/04/2023   Lab Results  Component Value Date   WBC 10.3 10/10/2023   HGB 13.9 10/10/2023   HCT 41.0 10/10/2023   MCV 91.7 10/10/2023   PLT 313 10/10/2023   Lab Results  Component Value Date   NA 132 (L) 10/10/2023   K 3.5 10/10/2023   CO2 28 10/10/2023   GLUCOSE 100 (H) 10/10/2023   BUN 13 10/10/2023   CREATININE 0.65 10/10/2023   BILITOT 0.4 10/10/2023   ALKPHOS 53 10/10/2023   AST 27 10/10/2023   ALT 26 10/10/2023   PROT 7.0 10/10/2023   ALBUMIN 4.4 10/10/2023   CALCIUM  9.8 10/10/2023   ANIONGAP 10 10/10/2023   EGFR 94 08/21/2023   GFR 75.48 07/09/2016   Lab Results  Component Value Date   CHOL 112 11/04/2023   Lab Results  Component Value Date   HDL 37 (L) 11/04/2023    Lab Results  Component Value Date   LDLCALC 54 11/04/2023   Lab Results  Component Value Date   TRIG 116 11/04/2023   Lab Results  Component Value Date   CHOLHDL 3.0 11/04/2023   Lab Results  Component Value Date   HGBA1C 5.7 (H) 11/04/2023        Results for orders placed or performed in visit on 11/26/23  POCT URINALYSIS DIP (CLINITEK)   Collection Time: 11/26/23  5:05 PM  Result Value Ref Range   Color, UA     Clarity, UA     Glucose, UA negative negative mg/dL   Bilirubin, UA negative negative   Ketones, POC UA negative negative mg/dL   Spec Grav, UA 8.989 8.989 - 1.025   Blood, UA negative negative   pH, UA 6.0 5.0 - 8.0   POC PROTEIN,UA negative negative, trace   Urobilinogen, UA negative 0.2 or 1.0 E.U./dL   Nitrite, UA Negative Negative   Leukocytes, UA Negative Negative     Assessment & Plan:   Assessment & Plan Dysuria normal Orders:   POCT URINALYSIS DIP (CLINITEK)  Simple chronic bronchitis (HCC) Persistent cough with increased shortness of breath and drainage. Lungs clear.  - Provided samples of Mucinex for cough management. - prescription cefdinir .    Acute laryngopharyngitis Persistent cough with increased shortness of breath and drainage. Lungs clear.  - Provided samples of Mucinex for cough management. - Gargle warm salt water .  - Rx cefdinir     Hearing loss of right ear due to cerumen impaction Ear drops for wax removal. - Consider nurse visit for ear cleaning if drops are ineffective.       Body mass index is 20.11 kg/m..  Meds ordered this encounter  Medications   cefdinir  (OMNICEF ) 300 MG capsule    Sig: Take 1 capsule (300 mg total) by mouth 2 (two) times daily.    Dispense:  20 capsule    Refill:  0    Orders Placed This Encounter  Procedures   POCT URINALYSIS DIP (CLINITEK)     Follow-up: No follow-ups on file.  An After Visit Summary was printed and given to the patient.  Abigail Free, MD [facility] [phone]

## 2023-11-26 ENCOUNTER — Encounter: Payer: Self-pay | Admitting: Family Medicine

## 2023-11-26 ENCOUNTER — Ambulatory Visit: Admitting: Family Medicine

## 2023-11-26 VITALS — BP 138/62 | HR 92 | Temp 97.3°F | Ht <= 58 in | Wt 96.2 lb

## 2023-11-26 DIAGNOSIS — J060 Acute laryngopharyngitis: Secondary | ICD-10-CM

## 2023-11-26 DIAGNOSIS — J410 Simple chronic bronchitis: Secondary | ICD-10-CM

## 2023-11-26 DIAGNOSIS — H6121 Impacted cerumen, right ear: Secondary | ICD-10-CM | POA: Diagnosis not present

## 2023-11-26 DIAGNOSIS — R300 Dysuria: Secondary | ICD-10-CM

## 2023-11-26 LAB — POCT URINALYSIS DIP (CLINITEK)
Bilirubin, UA: NEGATIVE
Blood, UA: NEGATIVE
Glucose, UA: NEGATIVE mg/dL
Ketones, POC UA: NEGATIVE mg/dL
Leukocytes, UA: NEGATIVE
Nitrite, UA: NEGATIVE
POC PROTEIN,UA: NEGATIVE
Spec Grav, UA: 1.01 (ref 1.010–1.025)
Urobilinogen, UA: NEGATIVE U/dL
pH, UA: 6 (ref 5.0–8.0)

## 2023-11-26 MED ORDER — CEFDINIR 300 MG PO CAPS: 300.0000 mg | ORAL_CAPSULE | Freq: Two times a day (BID) | ORAL | 0 refills | Status: DC

## 2023-11-26 NOTE — Patient Instructions (Signed)
  VISIT SUMMARY: During your visit, we addressed your persistent cough, blood pressure management, ear wax buildup, COPD symptoms, urinary hesitancy, and nasal irritation.  YOUR PLAN: ACUTE UPPER RESPIRATORY INFECTION WITH COUGH: You have a persistent cough with increased shortness of breath and drainage. -Use the provided samples of Mucinex to help manage your cough.  ESSENTIAL HYPERTENSION: Your blood pressure is stable after stopping carvedilol . -Continue to monitor your blood pressure at home.  CERUMEN IMPACTION, RIGHT EAR: You have a partial blockage in your right ear due to ear wax. -Continue using ear drops to remove the ear wax. -If the ear drops do not work, consider visiting a nurse for ear cleaning.  CHRONIC OBSTRUCTIVE PULMONARY DISEASE (COPD)/EMPHYSEMA: Your COPD symptoms include a persistent cough and drainage, which have worsened. -Monitor your symptoms and use over-the-counter medications as needed.  URINARY HESITANCY: You have slow urination, which you discussed with a urologist recently. -Follow any recommendations provided by your urologist.  ALLERGIC RHINITIS: You have nasal irritation and redness, possibly due to dry eyes. -Continue gargling with warm salt water  to help with nasal irritation.                      Contains text generated by [employer]bridge.                                 Contains text generated by [employer]bridge.

## 2023-12-01 DIAGNOSIS — R300 Dysuria: Secondary | ICD-10-CM | POA: Insufficient documentation

## 2023-12-01 DIAGNOSIS — H6121 Impacted cerumen, right ear: Secondary | ICD-10-CM | POA: Insufficient documentation

## 2023-12-01 NOTE — Assessment & Plan Note (Addendum)
 Persistent cough with increased shortness of breath and drainage. Lungs clear.  - Provided samples of Mucinex for cough management. - prescription cefdinir .

## 2023-12-01 NOTE — Assessment & Plan Note (Addendum)
 Ear drops for wax removal. - Consider nurse visit for ear cleaning if drops are ineffective.

## 2023-12-01 NOTE — Assessment & Plan Note (Addendum)
 Persistent cough with increased shortness of breath and drainage. Lungs clear.  - Provided samples of Mucinex for cough management. - Gargle warm salt water .  - Rx cefdinir 

## 2023-12-01 NOTE — Assessment & Plan Note (Addendum)
 normal Orders:   POCT URINALYSIS DIP (CLINITEK)

## 2023-12-04 ENCOUNTER — Encounter: Payer: Self-pay | Admitting: Family Medicine

## 2023-12-05 ENCOUNTER — Other Ambulatory Visit: Payer: Self-pay | Admitting: Family Medicine

## 2023-12-05 MED ORDER — DOXYCYCLINE HYCLATE 100 MG PO TABS: 100.0000 mg | ORAL_TABLET | Freq: Two times a day (BID) | ORAL | 0 refills | Status: DC

## 2023-12-11 ENCOUNTER — Ambulatory Visit

## 2023-12-11 VITALS — BP 112/60 | HR 96 | Temp 97.8°F | Ht <= 58 in | Wt 96.0 lb

## 2023-12-11 DIAGNOSIS — Z0000 Encounter for general adult medical examination without abnormal findings: Secondary | ICD-10-CM | POA: Diagnosis not present

## 2023-12-11 NOTE — Progress Notes (Signed)
 Chief Complaint  Patient presents with   Annual Exam     Subjective:   Allison Jimenez is a 73 y.o. female who presents for a Medicare Annual Wellness Visit.  Visit info / Clinical Intake: Medicare Wellness Visit Type:: Subsequent Annual Wellness Visit Persons participating in visit and providing information:: patient Medicare Wellness Visit Mode:: In-person (required for WTM) Interpreter Needed?: No Pre-visit prep was completed: no AWV questionnaire completed by patient prior to visit?: no Living arrangements:: with family/others Patient's Overall Health Status Rating: good Typical amount of pain: some Does pain affect daily life?: no Are you currently prescribed opioids?: (!) yes (oxy QID)  Dietary Habits and Nutritional Risks How many meals a day?: 2 Eats fruit and vegetables daily?: yes Most meals are obtained by: eating out; having others provide food In the last 2 weeks, have you had any of the following?: none Diabetic:: no  Functional Status Activities of Daily Living (to include ambulation/medication): Independent Ambulation: Independent with device- listed below Home Assistive Devices/Equipment: Walker (specify Type) Medication Administration: Independent Home Management (perform basic housework or laundry): Independent Manage your own finances?: yes Primary transportation is: driving; family / friends Concerns about vision?: no *vision screening is required for WTM* Concerns about hearing?: no  Fall Screening Falls in the past year?: 0 Number of falls in past year: 0 Was there an injury with Fall?: 0 Fall Risk Category Calculator: 0 Patient Fall Risk Level: Low Fall Risk  Fall Risk Patient at Risk for Falls Due to: No Fall Risks Fall risk Follow up: Falls evaluation completed  Home and Transportation Safety: All rugs have non-skid backing?: yes All stairs or steps have railings?: yes Grab bars in the bathtub or shower?: yes Have non-skid surface in  bathtub or shower?: yes Good home lighting?: yes Regular seat belt use?: (!) no Hospital stays in the last year:: no  Cognitive Assessment Difficulty concentrating, remembering, or making decisions? : no Will 6CIT or Mini Cog be Completed: yes What year is it?: 0 points What month is it?: 0 points Give patient an address phrase to remember (5 components): [address] [place] About what time is it?: 0 points Count backwards from 20 to 1: 0 points Say the months of the year in reverse: 0 points Repeat the address phrase from earlier: 0 points 6 CIT Score: 0 points  Advance Directives (For Healthcare) Does Patient Have a Medical Advance Directive?: No Would patient like information on creating a medical advance directive?: No - Patient declined  Reviewed/Updated  Reviewed/Updated: Reviewed All (Medical, Surgical, Family, Medications, Allergies, Care Teams, Patient Goals)    Allergies (verified) Erythromycin base, Vfend  [voriconazole ], and Penicillins   Current Medications (verified) Outpatient Encounter Medications as of 12/11/2023  Medication Sig   acetaminophen  (TYLENOL ) 500 MG tablet Take 500-1,000 mg by mouth every 6 (six) hours as needed for mild pain, moderate pain or headache.   albuterol  (VENTOLIN  HFA) 108 (90 Base) MCG/ACT inhaler Inhale 2 puffs into the lungs every 6 (six) hours as needed for shortness of breath or wheezing.   atorvastatin  (LIPITOR) 20 MG tablet TAKE 1 TABLET(20 MG) BY MOUTH DAILY   calcitonin, salmon, (MIACALCIN /FORTICAL) 200 UNIT/ACT nasal spray Place 1 spray into alternate nostrils daily.   Carboxymethylcellul-Glycerin (LUBRICATING EYE DROPS OP) Place 1 drop into both eyes daily.   carboxymethylcellul-glycerin (REFRESH RELIEVA) 0.5-0.9 % ophthalmic solution Apply 1 drop to eye.   cefdinir  (OMNICEF ) 300 MG capsule Take 1 capsule (300 mg total) by mouth 2 (two) times  daily.   cetirizine (ZYRTEC) 10 MG tablet Take 10 mg by mouth daily as needed for  allergies.   chlorthalidone  (HYGROTON ) 25 MG tablet TAKE 1 TABLET(25 MG) BY MOUTH DAILY   Cholecalciferol (VITAMIN D ) 50 MCG (2000 UT) CAPS Take 2,000 Units by mouth daily.   conjugated estrogens  (PREMARIN ) vaginal cream Place 1 applicator vaginally daily as needed (dryness / burning).   cyclobenzaprine  (FLEXERIL ) 10 MG tablet Take 1 tablet (10 mg total) by mouth 3 (three) times daily as needed for muscle spasms.   DERMA-SMOOTHE/FS SCALP 0.01 % OIL APPLY 3 NIGHTS PER WEEK FOR 2 WEEKS; THEN 2 NIGHTS PER WEEK FOR 2 WEEKS; THEN USE AS NEEDED. DAMPEN SCALP BEFORE USE.   diclofenac  Sodium (VOLTAREN ) 1 % GEL Apply 1 Application topically 4 (four) times daily as needed (pain).   doxycycline  (VIBRA -TABS) 100 MG tablet Take 1 tablet (100 mg total) by mouth 2 (two) times daily.   EMGALITY 120 MG/ML SOAJ    famotidine  (PEPCID ) 40 MG tablet TAKE 1 TABLET(40 MG) BY MOUTH AT BEDTIME   fluorometholone (FML) 0.1 % ophthalmic suspension INSTILL 1 DROP INTO LEFT EYE THREE TIMES DAILY FOR 3 DAYS THEN TWICE DAILY FOR 3 DAYS THEN EVERY DAY FOR 3 DAYS THEN STOP   FLUoxetine  (PROZAC ) 20 MG capsule Take 3 capsules (60 mg total) by mouth daily.   fluticasone  (FLONASE ) 50 MCG/ACT nasal spray Place 2 sprays into both nostrils daily. SHAKE LIQUID AND USE 2 SPRAYS IN EACH NOSTRIL DAILY   Fluticasone -Umeclidin-Vilant (TRELEGY ELLIPTA ) 100-62.5-25 MCG/ACT AEPB INHALE 1 PUFF INTO THE LUNGS DAILY   ketoconazole  (NIZORAL ) 2 % shampoo Apply 1 Application topically 2 (two) times a week.   levothyroxine  (SYNTHROID ) 25 MCG tablet TAKE 1 TABLET(25 MCG) BY MOUTH DAILY   montelukast  (SINGULAIR ) 10 MG tablet TAKE 1 TABLET(10 MG) BY MOUTH AT BEDTIME   nystatin -triamcinolone  ointment (MYCOLOG) Apply 1 Application topically 2 (two) times daily.   ondansetron  (ZOFRAN ) 4 MG tablet Take 1 tablet (4 mg total) by mouth every 8 (eight) hours as needed for nausea or vomiting.   OVER THE COUNTER MEDICATION Take 1 tablet by mouth in the morning. Salt  Supplement   oxyCODONE -acetaminophen  (PERCOCET) 10-325 MG tablet Take 1 tablet by mouth every 8 (eight) hours as needed for pain.   pantoprazole  (PROTONIX ) 40 MG tablet TAKE 1 TABLET(40 MG) BY MOUTH DAILY   polyethylene glycol (MIRALAX  / GLYCOLAX ) 17 g packet Take 17 g by mouth daily.   potassium chloride  (MICRO-K ) 10 MEQ CR capsule TAKE 2 CAPSULES(20 MEQ) BY MOUTH TWICE DAILY   pregabalin  (LYRICA ) 25 MG capsule TAKE 1 CAPSULE(25 MG) BY MOUTH AT BEDTIME   PROLIA  60 MG/ML SOSY injection Inject into the skin.   promethazine  (PHENERGAN ) 25 MG tablet TAKE 1 TABLET BY MOUTH EVERY 8 HOURS IF NEEDED   rizatriptan  (MAXALT ) 10 MG tablet Take 10 mg by mouth as needed for migraine.   TYRVAYA 0.03 MG/ACT SOLN Place 1 spray into both nostrils 2 (two) times daily.   vitamin B-12 (CYANOCOBALAMIN ) 500 MCG tablet Take 500 mcg by mouth daily at 12 noon.   Facility-Administered Encounter Medications as of 12/11/2023  Medication   [START ON 01/06/2024] denosumab  (PROLIA ) injection 60 mg    History: Past Medical History:  Diagnosis Date   Acute blood loss anemia (ABLA) 09/05/2020   Acute metabolic encephalopathy 07/28/2022   Allergy    ANXIETY 06/05/2006   Arthritis    SHOULDERS    BACK PAIN 01/05/2010   Carotid artery occlusion  Cataract    bilateral, left worse   Centrilobular emphysema (HCC)    COPD (chronic obstructive pulmonary disease) (HCC)    Depression    DIVERTICULOSIS, COLON 06/05/2006   DIZZINESS OR VERTIGO 06/05/2006   positional   GERD (gastroesophageal reflux disease)    Headache    Migraines   Hearing loss in left ear    no hearing aid   History of blood transfusion 08/2020   History of kidney stones    passed stones   HYPERLIPIDEMIA 06/05/2006   Hypertension    HYPOKALEMIA 12/14/2008   Hypothyroidism    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   Qualifier: Diagnosis of  By: Jame  MD, Maude FALCON    Pneumonia    Pre-diabetes    TOBACCO USER 11/25/2007   Past Surgical History:   Procedure Laterality Date   APPENDECTOMY     bilateral cataract surgery      BRONCHIAL BIOPSY  09/28/2021   Procedure: BRONCHIAL BIOPSIES;  Surgeon: Gladis Leonor HERO, MD;  Location: [facility] ENDOSCOPY;  Service: Pulmonary;;   BRONCHIAL NEEDLE ASPIRATION BIOPSY  09/28/2021   Procedure: BRONCHIAL NEEDLE ASPIRATION BIOPSIES;  Surgeon: Gladis Leonor HERO, MD;  Location: [facility] ENDOSCOPY;  Service: Pulmonary;;   BRONCHIAL WASHINGS  09/28/2021   Procedure: BRONCHIAL WASHINGS;  Surgeon: Gladis Leonor HERO, MD;  Location: [facility] ENDOSCOPY;  Service: Pulmonary;;   CESAREAN SECTION     x1   CHOLECYSTECTOMY     COLONOSCOPY     COSMETIC SURGERY     CYSTOSCOPY W/ URETERAL STENT PLACEMENT Right 07/29/2022   Procedure: CYSTOSCOPY WITH RETROGRADE PYELOGRAM/URETERAL STENT PLACEMENT;  Surgeon: Sherrilee Belvie CROME, MD;  Location: [facility] ORS;  Service: Urology;  Laterality: Right;   CYSTOSCOPY/URETEROSCOPY/HOLMIUM LASER/STENT PLACEMENT Right 09/05/2022   Procedure: RIGHT URETEROSCOPY/HOLMIUM LASER/STENT PLACEMENT;  Surgeon: Lovie Arlyss CROME, MD;  Location: [facility] ORS;  Service: Urology;  Laterality: Right;   mastoid surgery  1992   shunt in mastoid- endolymphatic sac in left ear    MRI  07/11/2020   x several, last one located in CE   REVERSE SHOULDER ARTHROPLASTY Right 01/24/2023   Procedure: REVERSE SHOULDER ARTHROPLASTY;  Surgeon: Melita Drivers, MD;  Location: [facility] ORS;  Service: Orthopedics;  Laterality: Right;  120 min   TUBAL LIGATION     UPPER GI ENDOSCOPY     vocal cord surgery     nodule x2   Family History  Problem Relation Age of Onset   Breast cancer Mother    Dementia Mother    Heart disease Mother    Hypertension Mother    Thyroid  disease Mother    Alzheimer's disease Father    Heart disease Father    Hypertension Father    Thyroid  disease Father    Diabetes Sister    Colon cancer Neg Hx    Rectal cancer Neg Hx    Stomach cancer Neg Hx    Colon polyps Neg Hx    Esophageal cancer Neg Hx    Social  History   Occupational History   Occupation: ASSISTANT CREDIT MANAGER    Employer: [employer]    Comment: Retired  Tobacco Use   Smoking status: Every Day    Current packs/day: 0.50    Average packs/day: 0.5 packs/day for 49.0 years (24.5 ttl pk-yrs)    Types: Cigarettes   Smokeless tobacco: Never   Tobacco comments:    3/4 ppd 09/18/23 Marcus HERO CMA  Vaping Use   Vaping status: Former   Start date: 01/01/2010  Quit date: 08/01/2021   Substances: Nicotine    Devices: menthol/fruit flavor  Substance and Sexual Activity   Alcohol  use: No    Alcohol /week: 0.0 standard drinks of alcohol    Drug use: No   Sexual activity: Not Currently    Birth control/protection: Post-menopausal   Tobacco Counseling Ready to quit: Not Answered Counseling given: Not Answered Tobacco comments: 3/4 ppd 09/18/23 Marcus HERO CMA  SDOH Screenings   Food Insecurity: No Food Insecurity (12/11/2023)  Housing: Unknown (12/11/2023)  Transportation Needs: No Transportation Needs (12/11/2023)  Utilities: Not At Risk (12/11/2023)  Alcohol  Screen: Low Risk  (07/03/2023)  Depression (PHQ2-9): Low Risk  (12/11/2023)  Financial Resource Strain: Low Risk (05/23/2023)   Received from [facility]  Physical Activity: Inactive (12/11/2023)  Social Connections: Socially Isolated (12/11/2023)  Stress: No Stress Concern Present (12/11/2023)  Tobacco Use: High Risk (12/11/2023)  Health Literacy: Adequate Health Literacy (12/11/2023)   See flowsheets for full screening details  Depression Screen PHQ 2 & 9 Depression Scale- Over the past 2 weeks, how often have you been bothered by any of the following problems? Little interest or pleasure in doing things: 0 Feeling down, depressed, or hopeless (PHQ Adolescent also includes...irritable): 1 PHQ-2 Total Score: 1 Trouble falling or staying asleep, or sleeping too much: 1 Feeling tired or having little energy: 1 Poor appetite or overeating (PHQ Adolescent also  includes...weight loss): 0 Feeling bad about yourself - or that you are a failure or have let yourself or your family down: 0 Trouble concentrating on things, such as reading the newspaper or watching television (PHQ Adolescent also includes...like school work): 0 Moving or speaking so slowly that other people could have noticed. Or the opposite - being so fidgety or restless that you have been moving around a lot more than usual: 0 Thoughts that you would be better off dead, or of hurting yourself in some way: 0 PHQ-9 Total Score: 3 If you checked off any problems, how difficult have these problems made it for you to do your work, take care of things at home, or get along with other people?: Not difficult at all  Depression Treatment Depression Interventions/Treatment : Currently on Treatment; PHQ2-9 Score <4 Follow-up Not Indicated     Goals Addressed   None          Objective:    Today's Vitals   12/11/23 1445  BP: 112/60  Pulse: 96  Temp: 97.8 F (36.6 C)  SpO2: 96%  Weight: 96 lb (43.5 kg)  Height: 4' 10 (1.473 m)   Body mass index is 20.06 kg/m.  Hearing/Vision screen No results found. Immunizations and Health Maintenance Health Maintenance  Topic Date Due   DTaP/Tdap/Td (2 - Td or Tdap) 05/17/2020   FOOT EXAM  09/17/2023   OPHTHALMOLOGY EXAM  02/08/2024   HEMOGLOBIN A1C  05/03/2024   COVID-19 Vaccine (8 - Pfizer risk 2025-26 season) 05/03/2024   Lung Cancer Screening  07/30/2024   Diabetic kidney evaluation - eGFR measurement  10/09/2024   Diabetic kidney evaluation - Urine ACR  11/03/2024   Medicare Annual Wellness (AWV)  12/10/2024   Mammogram  05/27/2025   Colonoscopy  10/18/2032   Pneumococcal Vaccine: 50+ Years  Completed   Influenza Vaccine  Completed   Bone Density Scan  Completed   Hepatitis C Screening  Completed   Zoster Vaccines- Shingrix  Completed   Meningococcal B Vaccine  Aged Out        Assessment/Plan:  This is a routine wellness  examination for Allison Jimenez.  Patient Care Team: Sherre Clapper, MD as PCP - General (Family Medicine) Ruthell Lauraine FALCON, NP as Nurse Practitioner (Pulmonary Disease) Livingston No, PA-C as Physician Assistant (General Practice) Misenheimer, Evalene, MD as Consulting Physician (Gastroenterology) Waylan Cain, MD as Consulting Physician (Ophthalmology) Rudy Cain CHRISTELLA DEVONNA (Physician Assistant) Gladis Leonor CHRISTELLA, MD as Consulting Physician (Pulmonary Disease) Ezzard Valaria LABOR, MD as Consulting Physician (Oncology) Pandora Cadet, [facility] (Pharmacist)  I have personally reviewed and noted the following in the patients chart:   Medical and social history Use of alcohol , tobacco or illicit drugs  Current medications and supplements including opioid prescriptions. Functional ability and status Nutritional status Physical activity Advanced directives List of other physicians Hospitalizations, surgeries, and ER visits in previous 12 months Vitals Screenings to include cognitive, depression, and falls Referrals and appointments  No orders of the defined types were placed in this encounter.  In addition, I have reviewed and discussed with patient certain preventive protocols, quality metrics, and best practice recommendations. A written personalized care plan for preventive services as well as general preventive health recommendations were provided to patient.   [place] Mailman, [place]   12/11/2023   Return in 1 year (on 12/10/2024).  After Visit Summary: (In Person-Declined) Patient declined AVS at this time.  Nurse Notes: I spent 25 minutes with patient. Patient is sweet and has no questions or concerns at this time.

## 2023-12-11 NOTE — Patient Instructions (Signed)
 Allison Jimenez , Thank you for taking time to come for your Medicare Wellness Visit. I appreciate your ongoing commitment to your health goals. Please review the following plan we discussed and let me know if I can assist you in the future.   These are the goals we discussed:  Goals      Have 3 meals a day     Eat when she is hungry Stomach has smaller and can't eat as much Eating smaller meals     Keep Myself Safe     Timeframe:  Short-Term Goal Priority:  Medium Start Date:                             Expected End Date:                       Follow Up Date 09/01/2021/   - accept and begin counseling    Why is this important?   Being hurt by someone close to you is scary.  Having a plan to keep you safe is important.    Notes:         This is a list of the screening recommended for you and due dates:  Health Maintenance  Topic Date Due   DTaP/Tdap/Td vaccine (2 - Td or Tdap) 05/17/2020   Complete foot exam   09/17/2023   Eye exam for diabetics  02/08/2024   Hemoglobin A1C  05/03/2024   COVID-19 Vaccine (8 - Pfizer risk 2025-26 season) 05/03/2024   Screening for Lung Cancer  07/30/2024   Yearly kidney function blood test for diabetes  10/09/2024   Yearly kidney health urinalysis for diabetes  11/03/2024   Medicare Annual Wellness Visit  12/10/2024   Breast Cancer Screening  05/27/2025   Colon Cancer Screening  10/18/2032   Pneumococcal Vaccine for age over 55  Completed   Flu Shot  Completed   Osteoporosis screening with Bone Density Scan  Completed   Hepatitis C Screening  Completed   Zoster (Shingles) Vaccine  Completed   Meningitis B Vaccine  Aged Out    Advanced directives: no  Next appointment: Follow up in one year for your annual wellness visit. Patient would like to wait till next year to make annual wellness visit appointment.    Preventive Care 20 Years and Older, Female Preventive care refers to lifestyle choices and visits with your health care  provider that can promote health and wellness. What does preventive care include? A yearly physical exam. This is also called an annual well check. Dental exams once or twice a year. Routine eye exams. Ask your health care provider how often you should have your eyes checked. Personal lifestyle choices, including: Daily care of your teeth and gums. Regular physical activity. Eating a healthy diet. Avoiding tobacco and drug use. Limiting alcohol  use. Practicing safe sex. Taking low-dose aspirin every day. Taking vitamin and mineral supplements as recommended by your health care provider. What happens during an annual well check? The services and screenings done by your health care provider during your annual well check will depend on your age, overall health, lifestyle risk factors, and family history of disease. Counseling  Your health care provider may ask you questions about your: Alcohol  use. Tobacco use. Drug use. Emotional well-being. Home and relationship well-being. Sexual activity. Eating habits. History of falls. Memory and ability to understand (cognition). Work and work astronomer. Reproductive health. Screening  You may have the following tests or measurements: Height, weight, and BMI. Blood pressure. Lipid and cholesterol levels. These may be checked every 5 years, or more frequently if you are over 47 years old. Skin check. Lung cancer screening. You may have this screening every year starting at age 4 if you have a 30-pack-year history of smoking and currently smoke or have quit within the past 15 years. Fecal occult blood test (FOBT) of the stool. You may have this test every year starting at age 63. Flexible sigmoidoscopy or colonoscopy. You may have a sigmoidoscopy every 5 years or a colonoscopy every 10 years starting at age 52. Hepatitis C blood test. Hepatitis B blood test. Sexually transmitted disease (STD) testing. Diabetes screening. This is done by  checking your blood sugar (glucose) after you have not eaten for a while (fasting). You may have this done every 1-3 years. Bone density scan. This is done to screen for osteoporosis. You may have this done starting at age 54. Mammogram. This may be done every 1-2 years. Talk to your health care provider about how often you should have regular mammograms. Talk with your health care provider about your test results, treatment options, and if necessary, the need for more tests. Vaccines  Your health care provider may recommend certain vaccines, such as: Influenza vaccine. This is recommended every year. Tetanus, diphtheria, and acellular pertussis (Tdap, Td) vaccine. You may need a Td booster every 10 years. Zoster vaccine. You may need this after age 47. Pneumococcal 13-valent conjugate (PCV13) vaccine. One dose is recommended after age 49. Pneumococcal polysaccharide (PPSV23) vaccine. One dose is recommended after age 45. Talk to your health care provider about which screenings and vaccines you need and how often you need them. This information is not intended to replace advice given to you by your health care provider. Make sure you discuss any questions you have with your health care provider. Document Released: 01/14/2015 Document Revised: 09/07/2015 Document Reviewed: 10/19/2014 Elsevier Interactive Patient Education  2017 [employer].  Fall Prevention in the Home Falls can cause injuries. They can happen to people of all ages. There are many things you can do to make your home safe and to help prevent falls. What can I do on the outside of my home? Regularly fix the edges of walkways and driveways and fix any cracks. Remove anything that might make you trip as you walk through a door, such as a raised step or threshold. Trim any bushes or trees on the path to your home. Use bright outdoor lighting. Clear any walking paths of anything that might make someone trip, such as rocks or  tools. Regularly check to see if handrails are loose or broken. Make sure that both sides of any steps have handrails. Any raised decks and porches should have guardrails on the edges. Have any leaves, snow, or ice cleared regularly. Use sand or salt on walking paths during winter. Clean up any spills in your garage right away. This includes oil or grease spills. What can I do in the bathroom? Use night lights. Install grab bars by the toilet and in the tub and shower. Do not use towel bars as grab bars. Use non-skid mats or decals in the tub or shower. If you need to sit down in the shower, use a plastic, non-slip stool. Keep the floor dry. Clean up any water  that spills on the floor as soon as it happens. Remove soap buildup in the tub or shower regularly.  Attach bath mats securely with double-sided non-slip rug tape. Do not have throw rugs and other things on the floor that can make you trip. What can I do in the bedroom? Use night lights. Make sure that you have a light by your bed that is easy to reach. Do not use any sheets or blankets that are too big for your bed. They should not hang down onto the floor. Have a firm chair that has side arms. You can use this for support while you get dressed. Do not have throw rugs and other things on the floor that can make you trip. What can I do in the kitchen? Clean up any spills right away. Avoid walking on wet floors. Keep items that you use a lot in easy-to-reach places. If you need to reach something above you, use a strong step stool that has a grab bar. Keep electrical cords out of the way. Do not use floor polish or wax that makes floors slippery. If you must use wax, use non-skid floor wax. Do not have throw rugs and other things on the floor that can make you trip. What can I do with my stairs? Do not leave any items on the stairs. Make sure that there are handrails on both sides of the stairs and use them. Fix handrails that are  broken or loose. Make sure that handrails are as long as the stairways. Check any carpeting to make sure that it is firmly attached to the stairs. Fix any carpet that is loose or worn. Avoid having throw rugs at the top or bottom of the stairs. If you do have throw rugs, attach them to the floor with carpet tape. Make sure that you have a light switch at the top of the stairs and the bottom of the stairs. If you do not have them, ask someone to add them for you. What else can I do to help prevent falls? Wear shoes that: Do not have high heels. Have rubber bottoms. Are comfortable and fit you well. Are closed at the toe. Do not wear sandals. If you use a stepladder: Make sure that it is fully opened. Do not climb a closed stepladder. Make sure that both sides of the stepladder are locked into place. Ask someone to hold it for you, if possible. Clearly mark and make sure that you can see: Any grab bars or handrails. First and last steps. Where the edge of each step is. Use tools that help you move around (mobility aids) if they are needed. These include: Canes. Walkers. Scooters. Crutches. Turn on the lights when you go into a dark area. Replace any light bulbs as soon as they burn out. Set up your furniture so you have a clear path. Avoid moving your furniture around. If any of your floors are uneven, fix them. If there are any pets around you, be aware of where they are. Review your medicines with your doctor. Some medicines can make you feel dizzy. This can increase your chance of falling. Ask your doctor what other things that you can do to help prevent falls. This information is not intended to replace advice given to you by your health care provider. Make sure you discuss any questions you have with your health care provider. Document Released: 10/14/2008 Document Revised: 05/26/2015 Document Reviewed: 01/22/2014 Elsevier Interactive Patient Education  2017 [employer].

## 2023-12-16 ENCOUNTER — Other Ambulatory Visit: Payer: Self-pay | Admitting: Family Medicine

## 2023-12-20 ENCOUNTER — Other Ambulatory Visit: Payer: Self-pay | Admitting: Family Medicine

## 2023-12-24 ENCOUNTER — Encounter: Payer: Self-pay | Admitting: Family Medicine

## 2024-01-03 ENCOUNTER — Other Ambulatory Visit (HOSPITAL_COMMUNITY): Payer: Self-pay | Admitting: Family Medicine

## 2024-01-03 ENCOUNTER — Other Ambulatory Visit: Payer: Self-pay | Admitting: Family Medicine

## 2024-01-16 ENCOUNTER — Ambulatory Visit: Admitting: Family Medicine

## 2024-01-16 ENCOUNTER — Ambulatory Visit: Payer: Self-pay

## 2024-01-16 ENCOUNTER — Encounter: Payer: Self-pay | Admitting: Family Medicine

## 2024-01-16 VITALS — BP 132/60 | HR 96 | Temp 98.0°F | Resp 18 | Ht <= 58 in | Wt 97.0 lb

## 2024-01-16 DIAGNOSIS — E039 Hypothyroidism, unspecified: Secondary | ICD-10-CM

## 2024-01-16 DIAGNOSIS — R300 Dysuria: Secondary | ICD-10-CM

## 2024-01-16 DIAGNOSIS — L509 Urticaria, unspecified: Secondary | ICD-10-CM | POA: Diagnosis not present

## 2024-01-16 DIAGNOSIS — R159 Full incontinence of feces: Secondary | ICD-10-CM | POA: Insufficient documentation

## 2024-01-16 LAB — POCT URINALYSIS DIP (CLINITEK)
Bilirubin, UA: NEGATIVE
Blood, UA: NEGATIVE
Glucose, UA: NEGATIVE mg/dL
Ketones, POC UA: NEGATIVE mg/dL
Leukocytes, UA: NEGATIVE
Nitrite, UA: NEGATIVE
POC PROTEIN,UA: NEGATIVE
Spec Grav, UA: 1.02
Urobilinogen, UA: 0.2 U/dL
pH, UA: 6

## 2024-01-16 NOTE — Assessment & Plan Note (Addendum)
 Lower urinary tract symptoms and evaluation for urinary tract infection. Patient reports slow stream and incomplete bladder emptying. Symptoms suggest possible UTI or medication-related issues. Lab Results  Component Value Date   COLORU yellow 01/16/2024   CLARITYU clear 01/16/2024   GLUCOSEUR negative 01/16/2024   BILIRUBINUR negative 01/16/2024   KETONESU negative 01/19/2021   SPECGRAV 1.020 01/16/2024   RBCUR negative 01/16/2024   PHUR 6.0 01/16/2024   PROTEINUR NEGATIVE 10/10/2023   UROBILINOGEN 0.2 01/16/2024   LEUKOCYTESUR Negative 01/16/2024  - Sent urine sample for culture. Orders:   POCT URINALYSIS DIP (CLINITEK)   Urine Culture

## 2024-01-16 NOTE — Assessment & Plan Note (Addendum)
 Check due to urticaria Previously therapeutic. - labs drawn today Orders:   T4, free   TSH

## 2024-01-16 NOTE — Assessment & Plan Note (Addendum)
 Pruritus and possible hypothyroidism Pruritus possibly linked to hypothyroidism due to recent discontinuation of thyroid  medication. - Ordered thyroid  function tests. - Continue Zyrtec. - Use thick emollient moisturizer with hydrocortisone cream. Orders:   T4, free   TSH

## 2024-01-16 NOTE — Telephone Encounter (Signed)
 FYI Only or Action Required?: FYI only for provider: appointment scheduled on today.  Patient was last seen in primary care on 11/26/2023 by Sherre Clapper, MD.  Called Nurse Triage reporting Urinary Retention.  Symptoms began several weeks ago.  Interventions attempted: OTC medications: hydrocortisone cream and Rest, hydration, or home remedies.  Symptoms are: gradually worsening.  Triage Disposition: See HCP Within 4 Hours (Or PCP Triage)  Patient/caregiver understands and will follow disposition?: Yes Reason for Disposition  Side (flank) or lower back pain present  Answer Assessment - Initial Assessment Questions Patient calling to schedule an appointment for 2-3 weeks of urinary hesitancy and slow urine stream. She is also experiencing vaginal itching.     1. SYMPTOM: What's the main symptom you're concerned about? (e.g., frequency, incontinence)     Itching, hesitancy  2. ONSET: When did the  itching, hesitancy start?     2-3 weeks  3. PAIN: Is there any pain? If Yes, ask: How bad is it? (Scale: 1-10; mild, moderate, severe)     denies 4. CAUSE: What do you think is causing the symptoms?     unsure 5. OTHER SYMPTOMS: Do you have any other symptoms? (e.g., blood in urine, fever, flank pain, pain with urination)     Slow urine stream. Low back pain. Denies all other symptoms  Protocols used: Urinary Symptoms-A-AH Copied from CRM [id number]. Topic: Clinical - Red Word Triage >> Jan 16, 2024  8:59 AM Ivette P wrote: Red Word that prompted transfer to Nurse Triage: Urine checked - bladder infection and just get it done.   alot of hesitation. takes longer to urinate. sometimes feel like get it out. several  Urniary infections. Already has had infections 2 times this year.

## 2024-01-16 NOTE — Assessment & Plan Note (Signed)
 Fecal incontinence and diarrhea Intermittent fecal incontinence and diarrhea possibly medication-related. - Increase dietary fiber intake. - Follow up with gastroenterologist.

## 2024-01-16 NOTE — Progress Notes (Signed)
 "  Acute Office Visit  Subjective:    Patient ID: Allison Jimenez, female    DOB: 05/18/1950, 74 y.o.   MRN: [id number]  Chief Complaint  Patient presents with   Dysuria    Discussed the use of AI scribe software for clinical note transcription with the patient, who gave verbal consent to proceed.  History of Present Illness   Allison Jimenez is a 74 year old female who presents with generalized itching.  Generalized pruritus - Generalized itching for the past three weeks, primarily affecting arms, chest, and abdomen - Severe pruritus, especially around the bra area - Associated erythema from scratching - No new medications, detergents, or body washes - Zyrtec taken daily and hydrocortisone cream used, both providing some relief  Urinary symptoms - Sensation of incomplete bladder emptying - Slow urine flow - History of urethral dilation and recurrent urinary tract infections - Recent urinalysis negative for infection - History of kidney stones  Rectal incontinence and diarrhea - Rectal incontinence described as a 'dribble' - Recent episodes of diarrhea - Miralax  not taken regularly due to loose stools  Gastrointestinal history - History of gastrointestinal ulcers - Recent colonoscopy in 2024 - Abdominal aneurysm under surveillance  Musculoskeletal and neurological history - History of osteoporosis and arthritis - Significant back injury in 2024, treated with epoxy  Endocrine history - History of hypothyroidism - Thyroid  medication discontinued due to fluctuating levels  Tobacco use - Continues to smoke despite previous health issues    Past Medical History:  Diagnosis Date   Acute blood loss anemia (ABLA) 09/05/2020   Acute metabolic encephalopathy 07/28/2022   Allergy    ANXIETY 06/05/2006   Arthritis    SHOULDERS    BACK PAIN 01/05/2010   Carotid artery occlusion    Cataract    bilateral, left worse   Centrilobular emphysema (HCC)    COPD (chronic  obstructive pulmonary disease) (HCC)    Depression    DIVERTICULOSIS, COLON 06/05/2006   DIZZINESS OR VERTIGO 06/05/2006   positional   GERD (gastroesophageal reflux disease)    Headache    Migraines   Hearing loss in left ear    no hearing aid   History of blood transfusion 08/2020   History of kidney stones    passed stones   HYPERLIPIDEMIA 06/05/2006   Hypertension    HYPOKALEMIA 12/14/2008   Hypothyroidism    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   Qualifier: Diagnosis of  By: Jame  MD, Maude FALCON    Pneumonia    Pre-diabetes    TOBACCO USER 11/25/2007    Past Surgical History:  Procedure Laterality Date   APPENDECTOMY     bilateral cataract surgery      BRONCHIAL BIOPSY  09/28/2021   Procedure: BRONCHIAL BIOPSIES;  Surgeon: Gladis Leonor HERO, MD;  Location: [facility] ENDOSCOPY;  Service: Pulmonary;;   BRONCHIAL NEEDLE ASPIRATION BIOPSY  09/28/2021   Procedure: BRONCHIAL NEEDLE ASPIRATION BIOPSIES;  Surgeon: Gladis Leonor HERO, MD;  Location: [facility] ENDOSCOPY;  Service: Pulmonary;;   BRONCHIAL WASHINGS  09/28/2021   Procedure: BRONCHIAL WASHINGS;  Surgeon: Gladis Leonor HERO, MD;  Location: [facility] ENDOSCOPY;  Service: Pulmonary;;   CESAREAN SECTION     x1   CHOLECYSTECTOMY     COLONOSCOPY     COSMETIC SURGERY     CYSTOSCOPY W/ URETERAL STENT PLACEMENT Right 07/29/2022   Procedure: CYSTOSCOPY WITH RETROGRADE PYELOGRAM/URETERAL STENT PLACEMENT;  Surgeon: Sherrilee Dover CROME, MD;  Location: [facility];  Service: Urology;  Laterality: Right;  CYSTOSCOPY/URETEROSCOPY/HOLMIUM LASER/STENT PLACEMENT Right 09/05/2022   Procedure: RIGHT URETEROSCOPY/HOLMIUM LASER/STENT PLACEMENT;  Surgeon: Lovie Arlyss CROME, MD;  Location: [facility];  Service: Urology;  Laterality: Right;   mastoid surgery  1992   shunt in mastoid- endolymphatic sac in left ear    MRI  07/11/2020   x several, last one located in CE   REVERSE SHOULDER ARTHROPLASTY Right 01/24/2023   Procedure: REVERSE SHOULDER ARTHROPLASTY;  Surgeon:  Melita Drivers, MD;  Location: [facility];  Service: Orthopedics;  Laterality: Right;  120 min   TUBAL LIGATION     UPPER GI ENDOSCOPY     vocal cord surgery     nodule x2    Family History  Problem Relation Age of Onset   Breast cancer Mother    Dementia Mother    Heart disease Mother    Hypertension Mother    Thyroid  disease Mother    Alzheimer's disease Father    Heart disease Father    Hypertension Father    Thyroid  disease Father    Diabetes Sister    Colon cancer Neg Hx    Rectal cancer Neg Hx    Stomach cancer Neg Hx    Colon polyps Neg Hx    Esophageal cancer Neg Hx     Social History   Socioeconomic History   Marital status: Divorced    Spouse name: Not on file   Number of children: 1   Years of education: 13   Highest education level: Not on file  Occupational History   Occupation: ASSISTANT CREDIT MANAGER    Employer: [employer]    Comment: Retired  Tobacco Use   Smoking status: Every Day    Current packs/day: 0.50    Average packs/day: 0.5 packs/day for 49.0 years (24.5 ttl pk-yrs)    Types: Cigarettes   Smokeless tobacco: Never   Tobacco comments:    3/4 ppd 09/18/23 Marcus HERO CMA  Vaping Use   Vaping status: Former   Start date: 01/01/2010   Quit date: 08/01/2021   Substances: Nicotine    Devices: menthol/fruit flavor  Substance and Sexual Activity   Alcohol  use: No    Alcohol /week: 0.0 standard drinks of alcohol    Drug use: No   Sexual activity: Not Currently    Birth control/protection: Post-menopausal  Other Topics Concern   Not on file  Social History Narrative   Patient lives at home alone. Patient has a biochemist, clinical. Patient is divorced .   Patient is retired.   Education high school.   Right handed.   Caffeine none.   Social Drivers of Health   Tobacco Use: High Risk (01/16/2024)   Patient History    Smoking Tobacco Use: Every Day    Smokeless Tobacco Use: Never    Passive Exposure: Not on file  Financial Resource  Strain: Low Risk (05/23/2023)   Received from [facility]   Overall Financial Resource Strain (CARDIA)    Difficulty of Paying Living Expenses: Not hard at all  Food Insecurity: No Food Insecurity (12/11/2023)   Epic    Worried About Programme Researcher, Broadcasting/film/video in the Last Year: Never true    Ran Out of Food in the Last Year: Never true  Transportation Needs: No Transportation Needs (12/11/2023)   Epic    Lack of Transportation (Medical): No    Lack of Transportation (Non-Medical): No  Physical Activity: Inactive (12/11/2023)   Exercise Vital Sign    Days of Exercise per Week: 0 days  Minutes of Exercise per Session: 0 min  Stress: No Stress Concern Present (12/11/2023)   [employer] of Occupational Health - Occupational Stress Questionnaire    Feeling of Stress: Not at all  Social Connections: Socially Isolated (12/11/2023)   Social Connection and Isolation Panel    Frequency of Communication with Friends and Family: More than three times a week    Frequency of Social Gatherings with Friends and Family: More than three times a week    Attends Religious Services: Never    Database Administrator or Organizations: No    Attends Banker Meetings: Never    Marital Status: Divorced  Catering Manager Violence: Not At Risk (12/11/2023)   Epic    Fear of Current or Ex-Partner: No    Emotionally Abused: No    Physically Abused: No    Sexually Abused: No  Depression (PHQ2-9): Low Risk (12/11/2023)   Depression (PHQ2-9)    PHQ-2 Score: 3  Alcohol  Screen: Low Risk (07/03/2023)   Alcohol  Screen    Last Alcohol  Screening Score (AUDIT): 0  Housing: Unknown (12/11/2023)   Epic    Unable to Pay for Housing in the Last Year: No    Number of Times Moved in the Last Year: Not on file    Homeless in the Last Year: No  Utilities: Not At Risk (12/11/2023)   Epic    Threatened with loss of utilities: No  Health Literacy: Adequate Health Literacy (12/11/2023)   B1300 Health  Literacy    Frequency of need for help with medical instructions: Never    Outpatient Medications Prior to Visit  Medication Sig Dispense Refill   acetaminophen  (TYLENOL ) 500 MG tablet Take 500-1,000 mg by mouth every 6 (six) hours as needed for mild pain, moderate pain or headache.     albuterol  (VENTOLIN  HFA) 108 (90 Base) MCG/ACT inhaler Inhale 2 puffs into the lungs every 6 (six) hours as needed for shortness of breath or wheezing. 8 g 2   atorvastatin  (LIPITOR) 20 MG tablet TAKE 1 TABLET(20 MG) BY MOUTH DAILY 90 tablet 1   calcitonin, salmon, (MIACALCIN /FORTICAL) 200 UNIT/ACT nasal spray Place 1 spray into alternate nostrils daily.     Carboxymethylcellul-Glycerin (LUBRICATING EYE DROPS OP) Place 1 drop into both eyes daily.     carboxymethylcellul-glycerin (REFRESH RELIEVA) 0.5-0.9 % ophthalmic solution Apply 1 drop to eye.     cefdinir  (OMNICEF ) 300 MG capsule Take 1 capsule (300 mg total) by mouth 2 (two) times daily. 20 capsule 0   cetirizine (ZYRTEC) 10 MG tablet Take 10 mg by mouth daily as needed for allergies.     chlorthalidone  (HYGROTON ) 25 MG tablet TAKE 1 TABLET(25 MG) BY MOUTH DAILY 90 tablet 1   Cholecalciferol (VITAMIN D ) 50 MCG (2000 UT) CAPS Take 2,000 Units by mouth daily.     conjugated estrogens  (PREMARIN ) vaginal cream Place 1 applicator vaginally daily as needed (dryness / burning).     cyclobenzaprine  (FLEXERIL ) 10 MG tablet Take 1 tablet (10 mg total) by mouth 3 (three) times daily as needed for muscle spasms. 30 tablet 1   DERMA-SMOOTHE/FS SCALP 0.01 % OIL APPLY 3 NIGHTS PER WEEK FOR 2 WEEKS; THEN 2 NIGHTS PER WEEK FOR 2 WEEKS; THEN USE AS NEEDED. DAMPEN SCALP BEFORE USE.     diclofenac  Sodium (VOLTAREN ) 1 % GEL Apply 1 Application topically 4 (four) times daily as needed (pain).     doxycycline  (VIBRA -TABS) 100 MG tablet Take 1 tablet (100 mg total) by mouth  2 (two) times daily. 20 tablet 0   EMGALITY 120 MG/ML SOAJ      famotidine  (PEPCID ) 40 MG tablet TAKE 1  TABLET(40 MG) BY MOUTH AT BEDTIME 90 tablet 1   fluorometholone (FML) 0.1 % ophthalmic suspension INSTILL 1 DROP INTO LEFT EYE THREE TIMES DAILY FOR 3 DAYS THEN TWICE DAILY FOR 3 DAYS THEN EVERY DAY FOR 3 DAYS THEN STOP     FLUoxetine  (PROZAC ) 20 MG capsule Take 3 capsules (60 mg total) by mouth daily. 270 capsule 1   fluticasone  (FLONASE ) 50 MCG/ACT nasal spray Place 2 sprays into both nostrils daily. SHAKE LIQUID AND USE 2 SPRAYS IN EACH NOSTRIL DAILY 16 g 6   Fluticasone -Umeclidin-Vilant (TRELEGY ELLIPTA ) 100-62.5-25 MCG/ACT AEPB INHALE 1 PUFF INTO THE LUNGS DAILY 180 each 3   ketoconazole  (NIZORAL ) 2 % shampoo Apply 1 Application topically 2 (two) times a week. 120 mL 0   levothyroxine  (SYNTHROID ) 25 MCG tablet TAKE 1 TABLET(25 MCG) BY MOUTH DAILY 90 tablet 0   montelukast  (SINGULAIR ) 10 MG tablet TAKE 1 TABLET(10 MG) BY MOUTH AT BEDTIME 90 tablet 3   nystatin -triamcinolone  ointment (MYCOLOG) Apply 1 Application topically 2 (two) times daily. 30 g 0   ondansetron  (ZOFRAN ) 4 MG tablet Take 1 tablet (4 mg total) by mouth every 8 (eight) hours as needed for nausea or vomiting. 10 tablet 0   OVER THE COUNTER MEDICATION Take 1 tablet by mouth in the morning. Salt Supplement     oxyCODONE -acetaminophen  (PERCOCET) 10-325 MG tablet Take 1 tablet by mouth every 8 (eight) hours as needed for pain.     pantoprazole  (PROTONIX ) 40 MG tablet TAKE 1 TABLET(40 MG) BY MOUTH DAILY 90 tablet 0   polyethylene glycol (MIRALAX  / GLYCOLAX ) 17 g packet Take 17 g by mouth daily.     potassium chloride  (MICRO-K ) 10 MEQ CR capsule TAKE 2 CAPSULES(20 MEQ) BY MOUTH TWICE DAILY 360 capsule 1   pregabalin  (LYRICA ) 25 MG capsule TAKE 1 CAPSULE(25 MG) BY MOUTH AT BEDTIME 90 capsule 1   PROLIA  60 MG/ML SOSY injection Inject into the skin.     promethazine  (PHENERGAN ) 25 MG tablet TAKE 1 TABLET BY MOUTH EVERY 8 HOURS IF NEEDED 20 tablet 0   rizatriptan  (MAXALT ) 10 MG tablet Take 10 mg by mouth as needed for migraine.      TYRVAYA 0.03 MG/ACT SOLN Place 1 spray into both nostrils 2 (two) times daily.     vitamin B-12 (CYANOCOBALAMIN ) 500 MCG tablet Take 500 mcg by mouth daily at 12 noon.     Facility-Administered Medications Prior to Visit  Medication Dose Route Frequency Provider Last Rate Last Admin   denosumab  (PROLIA ) injection 60 mg  60 mg Subcutaneous Once         Allergies[1]  Review of Systems  Constitutional:  Negative for chills, diaphoresis, fatigue and fever.  HENT:  Negative for congestion, ear pain and sinus pain.   Eyes: Negative.   Respiratory:  Negative for cough and shortness of breath.   Cardiovascular:  Negative for chest pain.  Gastrointestinal:  Negative for abdominal pain, constipation, diarrhea, nausea and vomiting.       Incontinence of stool  Endocrine: Negative.   Genitourinary:  Positive for difficulty urinating and urgency.  Musculoskeletal:  Negative for arthralgias.  Skin:        uticaria  Allergic/Immunologic: Negative.   Neurological:  Negative for dizziness, weakness, light-headedness and headaches.  Hematological: Negative.   Psychiatric/Behavioral: Negative.  Negative for dysphoric mood. The patient  is not nervous/anxious.        Objective:        01/16/2024    1:37 PM 12/11/2023    2:45 PM 11/26/2023    3:59 PM  Vitals with BMI  Height 4' 10 4' 10 4' 10  Weight 97 lbs 96 lbs 96 lbs 3 oz  BMI 20.28 20.07 20.11  Systolic 132 112 861  Diastolic 60 60 62  Pulse 96 96 92    No data found.   Physical Exam Vitals reviewed.  Constitutional:      General: She is not in acute distress.    Appearance: Normal appearance. She is not ill-appearing.  Eyes:     Conjunctiva/sclera: Conjunctivae normal.  Cardiovascular:     Rate and Rhythm: Normal rate and regular rhythm.     Heart sounds: Normal heart sounds. No murmur heard. Pulmonary:     Effort: Pulmonary effort is normal. No respiratory distress.     Breath sounds: Normal breath sounds. No wheezing.   Skin:    Findings: Ecchymosis (from scratching) present.  Neurological:     Mental Status: She is alert and oriented to person, place, and time. Mental status is at baseline.  Psychiatric:        Mood and Affect: Mood normal.        Behavior: Behavior normal.     Health Maintenance Due  Topic Date Due   DTaP/Tdap/Td (2 - Td or Tdap) 05/17/2020   FOOT EXAM  09/17/2023    There are no preventive care reminders to display for this patient.   Lab Results  Component Value Date   TSH 3.400 11/04/2023   Lab Results  Component Value Date   WBC 10.3 10/10/2023   HGB 13.9 10/10/2023   HCT 41.0 10/10/2023   MCV 91.7 10/10/2023   PLT 313 10/10/2023   Lab Results  Component Value Date   NA 132 (L) 10/10/2023   K 3.5 10/10/2023   CO2 28 10/10/2023   GLUCOSE 100 (H) 10/10/2023   BUN 13 10/10/2023   CREATININE 0.65 10/10/2023   BILITOT 0.4 10/10/2023   ALKPHOS 53 10/10/2023   AST 27 10/10/2023   ALT 26 10/10/2023   PROT 7.0 10/10/2023   ALBUMIN 4.4 10/10/2023   CALCIUM  9.8 10/10/2023   ANIONGAP 10 10/10/2023   EGFR 94 08/21/2023   GFR 75.48 07/09/2016   Lab Results  Component Value Date   CHOL 112 11/04/2023   Lab Results  Component Value Date   HDL 37 (L) 11/04/2023   Lab Results  Component Value Date   LDLCALC 54 11/04/2023   Lab Results  Component Value Date   TRIG 116 11/04/2023   Lab Results  Component Value Date   CHOLHDL 3.0 11/04/2023   Lab Results  Component Value Date   HGBA1C 5.7 (H) 11/04/2023        Results for orders placed or performed in visit on 01/16/24  POCT URINALYSIS DIP (CLINITEK)   Collection Time: 01/16/24  1:49 PM  Result Value Ref Range   Color, UA yellow yellow   Clarity, UA clear clear   Glucose, UA negative negative mg/dL   Bilirubin, UA negative negative   Ketones, POC UA negative negative mg/dL   Spec Grav, UA 8.979 8.989 - 1.025   Blood, UA negative negative   pH, UA 6.0 5.0 - 8.0   POC PROTEIN,UA negative  negative, trace   Urobilinogen, UA 0.2 0.2 or 1.0 E.U./dL   Nitrite, UA Negative  Negative   Leukocytes, UA Negative Negative     Assessment & Plan:   Assessment & Plan Dysuria Lower urinary tract symptoms and evaluation for urinary tract infection. Patient reports slow stream and incomplete bladder emptying. Symptoms suggest possible UTI or medication-related issues. Lab Results  Component Value Date   COLORU yellow 01/16/2024   CLARITYU clear 01/16/2024   GLUCOSEUR negative 01/16/2024   BILIRUBINUR negative 01/16/2024   KETONESU negative 01/19/2021   SPECGRAV 1.020 01/16/2024   RBCUR negative 01/16/2024   PHUR 6.0 01/16/2024   PROTEINUR NEGATIVE 10/10/2023   UROBILINOGEN 0.2 01/16/2024   LEUKOCYTESUR Negative 01/16/2024  - Sent urine sample for culture. Orders:   POCT URINALYSIS DIP (CLINITEK)   Urine Culture  Hypothyroidism (acquired) Check due to urticaria Previously therapeutic. - labs drawn today Orders:   T4, free   TSH   Urticaria Pruritus and possible hypothyroidism Pruritus possibly linked to hypothyroidism due to recent discontinuation of thyroid  medication. - Ordered thyroid  function tests. - Continue Zyrtec. - Use thick emollient moisturizer with hydrocortisone cream. Orders:   T4, free   TSH  Incontinence of feces, unspecified fecal incontinence type Fecal incontinence and diarrhea Intermittent fecal incontinence and diarrhea possibly medication-related. - Increase dietary fiber intake. - Follow up with gastroenterologist.       Follow-up: Return if symptoms worsen or fail to improve.  An After Visit Summary was printed and given to the patient. Harrie Cedar, FNP [facility] [phone]       [1]  Allergies Allergen Reactions   Erythromycin Base Diarrhea   Vfend  [Voriconazole ] Other (See Comments)    Visual changes   Penicillins Rash   "

## 2024-01-17 LAB — TSH: TSH: 2.98 u[IU]/mL (ref 0.450–4.500)

## 2024-01-17 LAB — T4, FREE: Free T4: 1.07 ng/dL (ref 0.82–1.77)

## 2024-01-18 LAB — URINE CULTURE

## 2024-01-19 ENCOUNTER — Ambulatory Visit: Payer: Self-pay | Admitting: Family Medicine

## 2024-01-29 ENCOUNTER — Ambulatory Visit: Admitting: Physician Assistant

## 2024-01-29 ENCOUNTER — Ambulatory Visit: Payer: Self-pay

## 2024-01-29 ENCOUNTER — Encounter: Payer: Self-pay | Admitting: Physician Assistant

## 2024-01-29 VITALS — BP 110/68 | HR 91 | Temp 97.7°F | Ht <= 58 in | Wt 96.3 lb

## 2024-01-29 DIAGNOSIS — L509 Urticaria, unspecified: Secondary | ICD-10-CM | POA: Diagnosis not present

## 2024-01-29 DIAGNOSIS — R300 Dysuria: Secondary | ICD-10-CM

## 2024-01-29 DIAGNOSIS — S81801A Unspecified open wound, right lower leg, initial encounter: Secondary | ICD-10-CM | POA: Diagnosis not present

## 2024-01-29 MED ORDER — DOXYCYCLINE HYCLATE 100 MG PO TABS: 100.0000 mg | ORAL_TABLET | Freq: Two times a day (BID) | ORAL | 0 refills | Status: AC

## 2024-01-29 NOTE — Assessment & Plan Note (Addendum)
 Urinary hesitancy Difficulty emptying bladder, possibly related to back issues or medication. Previous culture showed mixed flora. - Ordered urinalysis to check for new urinary tract infection. - Monitor urinary symptoms and report changes. Orders:   POCT URINALYSIS DIP (CLINITEK)

## 2024-01-29 NOTE — Progress Notes (Unsigned)
 "  Acute Office Visit  Subjective:    Patient ID: Allison Jimenez, female    DOB: [birth year], 74 y.o.   MRN: [id number]  Chief Complaint  Patient presents with   Wound on right shin    HPI: Patient is in today for wound on their leg, dysuria, itching on her abdomen.   Discussed the use of AI scribe software for clinical note transcription with the patient, who gave verbal consent to proceed.  History of Present Illness Allison Jimenez is a 74 year old female who presents with a wound on her leg. She is accompanied by her sister, who is a designer, jewellery.  The wound on her leg originated from a blood blister that was accidentally bumped, causing it to burst and bleed. Initially managed with a Band-Aid, the wound continued to bleed due to her activity levels. The patient reports that the wound was very red and hot, with redness extending down to where her sock would be. She has been applying polysporin and covering it with a Band-Aid. She started taking leftover doxycycline  from a previous sinus infection, taking a total of five tablets since Monday.  She has a history of spinal issues, including five broken bones in her back two years ago, treated with cortisone injections and epoxy. She has osteoporosis and reports ongoing follow-up with a spine specialist. She experiences numbness in her toes and legs, which she attributes to her back problems. 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  She has a history of urinary issues, including hesitation and incomplete emptying, which she associates with her back pain. A previous urine culture showed mixed flora, and she reports no significant burning during urination.  She experiences generalized itching since around Christmas, affecting her chest, stomach, and shoulders. She has tried administrator, civil service detergents and uses Benadryl  and cortisone cream for relief. She has a history of allergies,  including to cats, and has two cats at home.  Her current medications include oxycodone  for back pain, and she has been using Benadryl  and cortisone cream for itching.       Past Medical History:  Diagnosis Date   Acute blood loss anemia (ABLA) 09/05/2020   Acute metabolic encephalopathy 07/28/2022   Allergy    ANXIETY 06/05/2006   Arthritis    SHOULDERS    BACK PAIN 01/05/2010   Carotid artery occlusion    Cataract    bilateral, left worse   Centrilobular emphysema (HCC)    COPD (chronic obstructive pulmonary disease) (HCC)    Depression    DIVERTICULOSIS, COLON 06/05/2006   DIZZINESS OR VERTIGO 06/05/2006   positional   GERD (gastroesophageal reflux disease)    Headache    Migraines   Hearing loss in left ear    no hearing aid   History of blood transfusion 08/2020   History of kidney stones    passed stones   HYPERLIPIDEMIA 06/05/2006   Hypertension    HYPOKALEMIA 12/14/2008   Hypothyroidism    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   Qualifier: Diagnosis of  By: Jame  MD, Maude FALCON    Pneumonia    Pre-diabetes    TOBACCO USER 11/25/2007    Past Surgical History:  Procedure Laterality Date   APPENDECTOMY     bilateral cataract surgery      BRONCHIAL BIOPSY  09/28/2021   Procedure: BRONCHIAL BIOPSIES;  Surgeon: Gladis Leonor HERO, MD;  Location: [facility] ENDOSCOPY;  Service: Pulmonary;;   BRONCHIAL NEEDLE ASPIRATION BIOPSY  09/28/2021   Procedure: BRONCHIAL NEEDLE ASPIRATION BIOPSIES;  Surgeon: Gladis Leonor HERO, MD;  Location: [facility] ENDOSCOPY;  Service: Pulmonary;;   BRONCHIAL WASHINGS  09/28/2021   Procedure: BRONCHIAL WASHINGS;  Surgeon: Gladis Leonor HERO, MD;  Location: [facility] ENDOSCOPY;  Service: Pulmonary;;   CESAREAN SECTION     x1   CHOLECYSTECTOMY     COLONOSCOPY     COSMETIC SURGERY     CYSTOSCOPY W/ URETERAL STENT PLACEMENT Right 07/29/2022   Procedure: CYSTOSCOPY WITH RETROGRADE PYELOGRAM/URETERAL STENT PLACEMENT;  Surgeon: Sherrilee Belvie CROME, MD;   Location: [facility] ORS;  Service: Urology;  Laterality: Right;   CYSTOSCOPY/URETEROSCOPY/HOLMIUM LASER/STENT PLACEMENT Right 09/05/2022   Procedure: RIGHT URETEROSCOPY/HOLMIUM LASER/STENT PLACEMENT;  Surgeon: Lovie Arlyss CROME, MD;  Location: [facility] ORS;  Service: Urology;  Laterality: Right;   mastoid surgery  1992   shunt in mastoid- endolymphatic sac in left ear    MRI  07/11/2020   x several, last one located in CE   REVERSE SHOULDER ARTHROPLASTY Right 01/24/2023   Procedure: REVERSE SHOULDER ARTHROPLASTY;  Surgeon: Melita Drivers, MD;  Location: [facility] ORS;  Service: Orthopedics;  Laterality: Right;  120 min   TUBAL LIGATION     UPPER GI ENDOSCOPY     vocal cord surgery     nodule x2    Family History  Problem Relation Age of Onset   Breast cancer Mother    Dementia Mother    Heart disease Mother    Hypertension Mother    Thyroid  disease Mother    Alzheimer's disease Father    Heart disease Father    Hypertension Father    Thyroid  disease Father    Diabetes Sister    Colon cancer Neg Hx    Rectal cancer Neg Hx    Stomach cancer Neg Hx    Colon polyps Neg Hx    Esophageal cancer Neg Hx     Social History   Socioeconomic History   Marital status: Divorced    Spouse name: Not on file   Number of children: 1   Years of education: 13   Highest education level: Not on file  Occupational History   Occupation: ASSISTANT CREDIT MANAGER    Employer: [employer]    Comment: Retired  Tobacco Use   Smoking status: Every Day    Current packs/day: 0.50    Average packs/day: 0.5 packs/day for 49.0 years (24.5 ttl pk-yrs)    Types: Cigarettes   Smokeless tobacco: Never   Tobacco comments:    3/4 ppd 09/18/23 Marcus HERO CMA  Vaping Use   Vaping status: Former   Start date: 01/01/2010   Quit date: 08/01/2021   Substances: Nicotine    Devices: menthol/fruit flavor  Substance and Sexual Activity   Alcohol  use: No    Alcohol /week: 0.0 standard drinks of alcohol    Drug use: No   Sexual  activity: Not Currently    Birth control/protection: Post-menopausal  Other Topics Concern   Not on file  Social History Narrative   Patient lives at home alone. Patient has a biochemist, clinical. Patient is divorced .   Patient is retired.   Education high school.   Right handed.   Caffeine none.   Social Drivers of Health   Tobacco Use: High Risk (01/29/2024)   Patient History    Smoking Tobacco Use: Every Day    Smokeless Tobacco Use: Never    Passive Exposure: Not on file  Financial  Resource Strain: Low Risk (05/23/2023)   Received from [facility]   Overall Financial Resource Strain (CARDIA)    Difficulty of Paying Living Expenses: Not hard at all  Food Insecurity: No Food Insecurity (12/11/2023)   Epic    Worried About Programme Researcher, Broadcasting/film/video in the Last Year: Never true    Ran Out of Food in the Last Year: Never true  Transportation Needs: No Transportation Needs (12/11/2023)   Epic    Lack of Transportation (Medical): No    Lack of Transportation (Non-Medical): No  Physical Activity: Inactive (12/11/2023)   Exercise Vital Sign    Days of Exercise per Week: 0 days    Minutes of Exercise per Session: 0 min  Stress: No Stress Concern Present (12/11/2023)   [employer] of Occupational Health - Occupational Stress Questionnaire    Feeling of Stress: Not at all  Social Connections: Socially Isolated (12/11/2023)   Social Connection and Isolation Panel    Frequency of Communication with Friends and Family: More than three times a week    Frequency of Social Gatherings with Friends and Family: More than three times a week    Attends Religious Services: Never    Database Administrator or Organizations: No    Attends Banker Meetings: Never    Marital Status: Divorced  Catering Manager Violence: Not At Risk (12/11/2023)   Epic    Fear of Current or Ex-Partner: No    Emotionally Abused: No    Physically Abused: No    Sexually Abused: No  Depression  (PHQ2-9): Low Risk (12/11/2023)   Depression (PHQ2-9)    PHQ-2 Score: 3  Alcohol  Screen: Low Risk (07/03/2023)   Alcohol  Screen    Last Alcohol  Screening Score (AUDIT): 0  Housing: Unknown (12/11/2023)   Epic    Unable to Pay for Housing in the Last Year: No    Number of Times Moved in the Last Year: Not on file    Homeless in the Last Year: No  Utilities: Not At Risk (12/11/2023)   Epic    Threatened with loss of utilities: No  Health Literacy: Adequate Health Literacy (12/11/2023)   B1300 Health Literacy    Frequency of need for help with medical instructions: Never    Outpatient Medications Prior to Visit  Medication Sig Dispense Refill   acetaminophen  (TYLENOL ) 500 MG tablet Take 500-1,000 mg by mouth every 6 (six) hours as needed for mild pain, moderate pain or headache.     albuterol  (VENTOLIN  HFA) 108 (90 Base) MCG/ACT inhaler Inhale 2 puffs into the lungs every 6 (six) hours as needed for shortness of breath or wheezing. 8 g 2   atorvastatin  (LIPITOR) 20 MG tablet TAKE 1 TABLET(20 MG) BY MOUTH DAILY 90 tablet 1   calcitonin, salmon, (MIACALCIN /FORTICAL) 200 UNIT/ACT nasal spray Place 1 spray into alternate nostrils daily.     Carboxymethylcellul-Glycerin (LUBRICATING EYE DROPS OP) Place 1 drop into both eyes daily.     carboxymethylcellul-glycerin (REFRESH RELIEVA) 0.5-0.9 % ophthalmic solution Apply 1 drop to eye.     cetirizine (ZYRTEC) 10 MG tablet Take 10 mg by mouth daily as needed for allergies.     chlorthalidone  (HYGROTON ) 25 MG tablet TAKE 1 TABLET(25 MG) BY MOUTH DAILY 90 tablet 1   Cholecalciferol (VITAMIN D ) 50 MCG (2000 UT) CAPS Take 2,000 Units by mouth daily.     conjugated estrogens  (PREMARIN ) vaginal cream Place 1 applicator vaginally daily as needed (dryness / burning).  cyclobenzaprine  (FLEXERIL ) 10 MG tablet Take 1 tablet (10 mg total) by mouth 3 (three) times daily as needed for muscle spasms. 30 tablet 1   DERMA-SMOOTHE/FS SCALP 0.01 % OIL APPLY 3 NIGHTS  PER WEEK FOR 2 WEEKS; THEN 2 NIGHTS PER WEEK FOR 2 WEEKS; THEN USE AS NEEDED. DAMPEN SCALP BEFORE USE.     diclofenac  Sodium (VOLTAREN ) 1 % GEL Apply 1 Application topically 4 (four) times daily as needed (pain).     doxycycline  (VIBRA -TABS) 100 MG tablet Take 1 tablet (100 mg total) by mouth 2 (two) times daily. 20 tablet 0   EMGALITY 120 MG/ML SOAJ      famotidine  (PEPCID ) 40 MG tablet TAKE 1 TABLET(40 MG) BY MOUTH AT BEDTIME 90 tablet 1   fluorometholone (FML) 0.1 % ophthalmic suspension INSTILL 1 DROP INTO LEFT EYE THREE TIMES DAILY FOR 3 DAYS THEN TWICE DAILY FOR 3 DAYS THEN EVERY DAY FOR 3 DAYS THEN STOP     FLUoxetine  (PROZAC ) 20 MG capsule Take 3 capsules (60 mg total) by mouth daily. 270 capsule 1   fluticasone  (FLONASE ) 50 MCG/ACT nasal spray Place 2 sprays into both nostrils daily. SHAKE LIQUID AND USE 2 SPRAYS IN EACH NOSTRIL DAILY 16 g 6   Fluticasone -Umeclidin-Vilant (TRELEGY ELLIPTA ) 100-62.5-25 MCG/ACT AEPB INHALE 1 PUFF INTO THE LUNGS DAILY 180 each 3   ketoconazole  (NIZORAL ) 2 % shampoo Apply 1 Application topically 2 (two) times a week. 120 mL 0   levothyroxine  (SYNTHROID ) 25 MCG tablet TAKE 1 TABLET(25 MCG) BY MOUTH DAILY 90 tablet 0   montelukast  (SINGULAIR ) 10 MG tablet TAKE 1 TABLET(10 MG) BY MOUTH AT BEDTIME 90 tablet 3   nystatin -triamcinolone  ointment (MYCOLOG) Apply 1 Application topically 2 (two) times daily. 30 g 0   ondansetron  (ZOFRAN ) 4 MG tablet Take 1 tablet (4 mg total) by mouth every 8 (eight) hours as needed for nausea or vomiting. 10 tablet 0   OVER THE COUNTER MEDICATION Take 1 tablet by mouth in the morning. Salt Supplement     oxyCODONE -acetaminophen  (PERCOCET) 10-325 MG tablet Take 1 tablet by mouth every 8 (eight) hours as needed for pain.     pantoprazole  (PROTONIX ) 40 MG tablet TAKE 1 TABLET(40 MG) BY MOUTH DAILY 90 tablet 0   polyethylene glycol (MIRALAX  / GLYCOLAX ) 17 g packet Take 17 g by mouth daily.     potassium chloride  (MICRO-K ) 10 MEQ CR capsule  TAKE 2 CAPSULES(20 MEQ) BY MOUTH TWICE DAILY 360 capsule 1   pregabalin  (LYRICA ) 25 MG capsule TAKE 1 CAPSULE(25 MG) BY MOUTH AT BEDTIME 90 capsule 1   PROLIA  60 MG/ML SOSY injection Inject into the skin.     promethazine  (PHENERGAN ) 25 MG tablet TAKE 1 TABLET BY MOUTH EVERY 8 HOURS IF NEEDED 20 tablet 0   rizatriptan  (MAXALT ) 10 MG tablet Take 10 mg by mouth as needed for migraine.     TYRVAYA 0.03 MG/ACT SOLN Place 1 spray into both nostrils 2 (two) times daily.     vitamin B-12 (CYANOCOBALAMIN ) 500 MCG tablet Take 500 mcg by mouth daily at 12 noon.     cefdinir  (OMNICEF ) 300 MG capsule Take 1 capsule (300 mg total) by mouth 2 (two) times daily. 20 capsule 0   Facility-Administered Medications Prior to Visit  Medication Dose Route Frequency Provider Last Rate Last Admin   denosumab  (PROLIA ) injection 60 mg  60 mg Subcutaneous Once         Allergies[1]  Review of Systems  Constitutional:  Negative for appetite  change, fatigue and fever.  HENT:  Negative for congestion, ear pain, sinus pressure and sore throat.   Respiratory:  Negative for cough, chest tightness, shortness of breath and wheezing.   Cardiovascular:  Negative for chest pain and palpitations.  Gastrointestinal:  Negative for abdominal pain, constipation, diarrhea, nausea and vomiting.  Genitourinary:  Negative for dysuria and hematuria.  Musculoskeletal:  Negative for arthralgias, back pain, joint swelling and myalgias.  Skin:  Positive for wound (Right shin bone). Negative for rash.  Neurological:  Negative for dizziness, weakness and headaches.  Psychiatric/Behavioral:  Negative for dysphoric mood. The patient is not nervous/anxious.        Objective:        01/29/2024    3:26 PM 01/16/2024    1:37 PM 12/11/2023    2:45 PM  Vitals with BMI  Height 4' 10 4' 10 4' 10  Weight 96 lbs 5 oz 97 lbs 96 lbs  BMI 20.13 20.28 20.07  Systolic 110 132 887  Diastolic 68 60 60  Pulse 91 96 96    Orthostatic VS for the  past 72 hrs (Last 3 readings):  Patient Position BP Location  01/29/24 1526 Sitting Left Arm     Physical Exam Vitals reviewed.  Constitutional:      Appearance: Normal appearance.  Neck:     Vascular: No carotid bruit.  Cardiovascular:     Rate and Rhythm: Normal rate and regular rhythm.     Heart sounds: Normal heart sounds.  Pulmonary:     Effort: Pulmonary effort is normal.     Breath sounds: Normal breath sounds.  Abdominal:     General: Bowel sounds are normal.     Palpations: Abdomen is soft.     Tenderness: There is no abdominal tenderness.  Skin:    Findings: Rash and wound present.  Neurological:     Mental Status: She is alert and oriented to person, place, and time.  Psychiatric:        Mood and Affect: Mood normal.        Behavior: Behavior normal.       Media Information   Document Information  Photographic Image: Photos    01/29/2024 15:39  Attached To:  Office Visit on 01/29/24 with Milon Cleaves, PA  Source Information  [place], [place], [place]  [facility]    There are no preventive care reminders to display for this patient.   Lab Results  Component Value Date   TSH 2.980 01/16/2024   Lab Results  Component Value Date   WBC 10.3 10/10/2023   HGB 13.9 10/10/2023   HCT 41.0 10/10/2023   MCV 91.7 10/10/2023   PLT 313 10/10/2023   Lab Results  Component Value Date   NA 132 (L) 10/10/2023   K 3.5 10/10/2023   CO2 28 10/10/2023   GLUCOSE 100 (H) 10/10/2023   BUN 13 10/10/2023   CREATININE 0.65 10/10/2023   BILITOT 0.4 10/10/2023   ALKPHOS 53 10/10/2023   AST 27 10/10/2023   ALT 26 10/10/2023   PROT 7.0 10/10/2023   ALBUMIN 4.4 10/10/2023   CALCIUM  9.8 10/10/2023   ANIONGAP 10 10/10/2023   EGFR 94 08/21/2023   GFR 75.48 07/09/2016   Lab Results  Component Value Date   CHOL 112 11/04/2023   Lab Results  Component Value Date   HDL 37 (L) 11/04/2023   Lab Results  Component Value Date   LDLCALC 54 11/04/2023   Lab  Results  Component Value  Date   TRIG 116 11/04/2023   Lab Results  Component Value Date   CHOLHDL 3.0 11/04/2023   Lab Results  Component Value Date   HGBA1C 5.7 (H) 11/04/2023        Results for orders placed or performed in visit on 01/16/24  POCT URINALYSIS DIP (CLINITEK)   Collection Time: 01/16/24  1:49 PM  Result Value Ref Range   Color, UA yellow yellow   Clarity, UA clear clear   Glucose, UA negative negative mg/dL   Bilirubin, UA negative negative   Ketones, POC UA negative negative mg/dL   Spec Grav, UA 8.979 8.989 - 1.025   Blood, UA negative negative   pH, UA 6.0 5.0 - 8.0   POC PROTEIN,UA negative negative, trace   Urobilinogen, UA 0.2 0.2 or 1.0 E.U./dL   Nitrite, UA Negative Negative   Leukocytes, UA Negative Negative  T4, free   Collection Time: 01/16/24  2:15 PM  Result Value Ref Range   Free T4 1.07 0.82 - 1.77 ng/dL  TSH   Collection Time: 01/16/24  2:15 PM  Result Value Ref Range   TSH 2.980 0.450 - 4.500 uIU/mL  Urine Culture   Collection Time: 01/16/24  3:13 PM   Specimen: Urine   UR  Result Value Ref Range   Urine Culture, Routine Final report    Organism ID, Bacteria Comment      Assessment & Plan:   Assessment & Plan Leg wound, right, initial encounter Cellulitis of left lower extremity Superficial cellulitis likely secondary to a blood blister. No signs of osteomyelitis. Doxycycline  appears effective. - Prescribed doxycycline  for one week. - Advised to keep wound covered with a bandage when out. - Instructed to apply polysporin to the wound. - Instructed to monitor for spreading redness and report if it occurs. Orders:   doxycycline  (VIBRA -TABS) 100 MG tablet; Take 1 tablet (100 mg total) by mouth 2 (two) times daily.  Dysuria Urinary hesitancy Difficulty emptying bladder, possibly related to back issues or medication. Previous culture showed mixed flora. - Ordered urinalysis to check for new urinary tract infection. - Monitor  urinary symptoms and report changes. Orders:   POCT URINALYSIS DIP (CLINITEK)  Urticaria Chronic pruritus Pruritus affecting multiple areas, possibly due to allergies or medication. Previous allergy testing showed cat and tree pollen allergies. - Referred to allergist for further evaluation. - Continue Benadryl  at night, consider adding Allegra or Zyrtec in the morning. Orders:   Ambulatory referral to Allergy   Onychomycosis Possible onychomycosis with brittle nails. No current infection but risk of ingrown toenails. - Trimmed nails to prevent ingrown toenails. - Referred to podiatry for further evaluation and management.  Body mass index is 20.13 kg/m.Allison Jimenez    No orders of the defined types were placed in this encounter.   No orders of the defined types were placed in this encounter.    Follow-up: No follow-ups on file.  An After Visit Summary was printed and given to the patient.    I,Lauren M Auman,acting as a neurosurgeon for [employer], PA.,have documented all relevant documentation on the behalf of Nola Angles, PA,as directed by  Nola Angles, PA while in the presence of Nola Angles, [place].    Nola Angles, [place] [facility] [phone]     [1]  Allergies Allergen Reactions   Erythromycin Base Diarrhea   Vfend  [Voriconazole ] Other (See Comments)    Visual changes   Penicillins Rash   "

## 2024-01-29 NOTE — Telephone Encounter (Signed)
 FYI Only or Action Required?: FYI only for provider: appointment scheduled on 1/28.  Patient was last seen in primary care on 01/16/2024 by Teressa Harrie HERO, FNP.  Called Nurse Triage reporting Wound Infection.  Symptoms began several days ago.  Interventions attempted: Prescription medications: doxy.  Symptoms are: gradually worsening.  Triage Disposition: See HCP Within 4 Hours (Or PCP Triage)  Patient/caregiver understands and will follow disposition?: Yes  Message from [facility] sent at 01/29/2024  8:18 AM EST  Summary: wound on leg   Reason for Triage: wound on leg right leg knee down swollen very red hot to touch bottom part of leg very red been going on for 3 days started as a blood blister         Reason for Disposition  [1] Skin around the wound has become red AND [2] larger than 2 inches (5 cm)  Answer Assessment - Initial Assessment Questions Last week a blood blister opened up the size of a dime- 2-3days ago started with redness warmth and some mild swelling. Looks worse at the end of the day after being up on it. Painful when first stands on it in the morning.  -Started Doxycycline  that she had from her sinus infection for the last 2 days. No improvement  Wrapped leg last night but didn't stay on  Foot numbness for 2 years- had hyponatremia Pain meds for back daily Denies Fever, SOB, CP, Dizziness, or palpable knot   Appt with PCP office this afternoon to assess. Understands ED precautions.   1. LOCATION: Where is the wound located?      Right leg from knee down  2. WOUND APPEARANCE: What does the wound look like?      Leg is red and inflammed- slightly swollen  3. SIZE: If redness is present, ask: What is the size of the red area? (Inches, centimeters, or compare to size of a coin)      Dime size  4. SPREAD: What's changed in the last day?  Do you see any red streaks coming from the wound?     Redness from 2-3inches below the knee and down  5.  ONSET: When did it start to look infected?      Last week- worsened over the last 2-3days  6. MECHANISM: How did the wound start, what was the cause?     Started as blood blister 7. PAIN: Do you have any pain?  If Yes, ask: How bad is the pain?  (e.g., Scale 1-10; mild, moderate, or severe)     When first gets up in AM 8. FEVER: Do you have a fever? If Yes, ask: What is your temperature, how was it measured, and when did it start?     denies 9. OTHER SYMPTOMS: Do you have any other symptoms? (e.g., shaking chills, weakness, rash elsewhere on body)     Denies  Protocols used: Wound Infection Suspected-A-AH

## 2024-02-01 DIAGNOSIS — S81801A Unspecified open wound, right lower leg, initial encounter: Secondary | ICD-10-CM | POA: Insufficient documentation

## 2024-02-01 IMAGING — MG DIGITAL SCREENING BILAT W/ CAD
8 series · 8 of 24 positions shown · non-contrast
Comparison: Previous exam(s).

CLINICAL DATA: Screening.

EXAM:
DIGITAL SCREENING BILATERAL MAMMOGRAM WITH CAD
TECHNIQUE: Bilateral screening digital craniocaudal and mediolateral oblique
mammograms were obtained. The images were evaluated with
computer-aided detection.

[L MLO synth-2D]
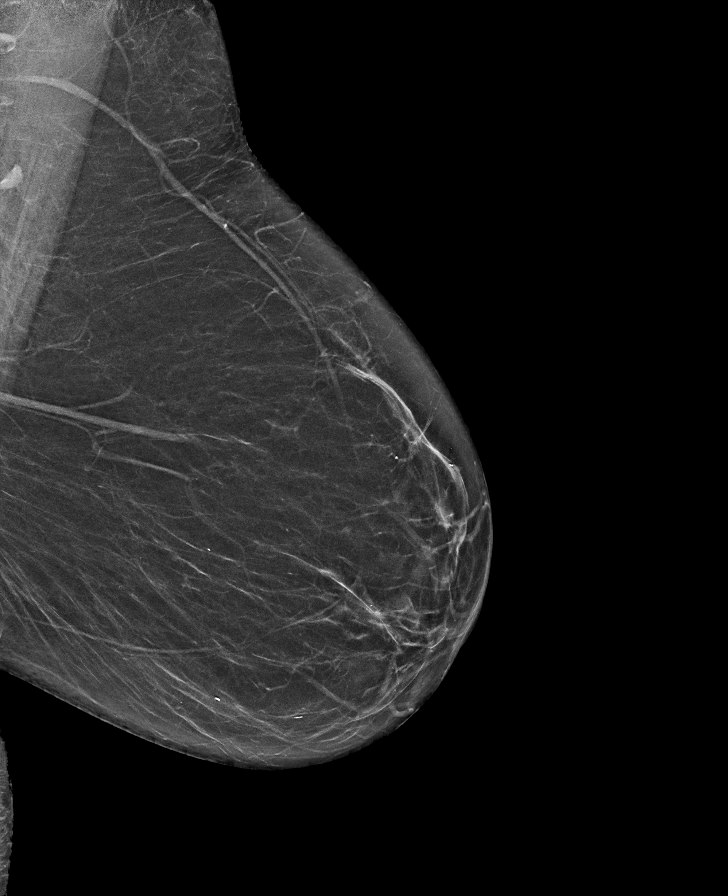

[L CC synth-2D]
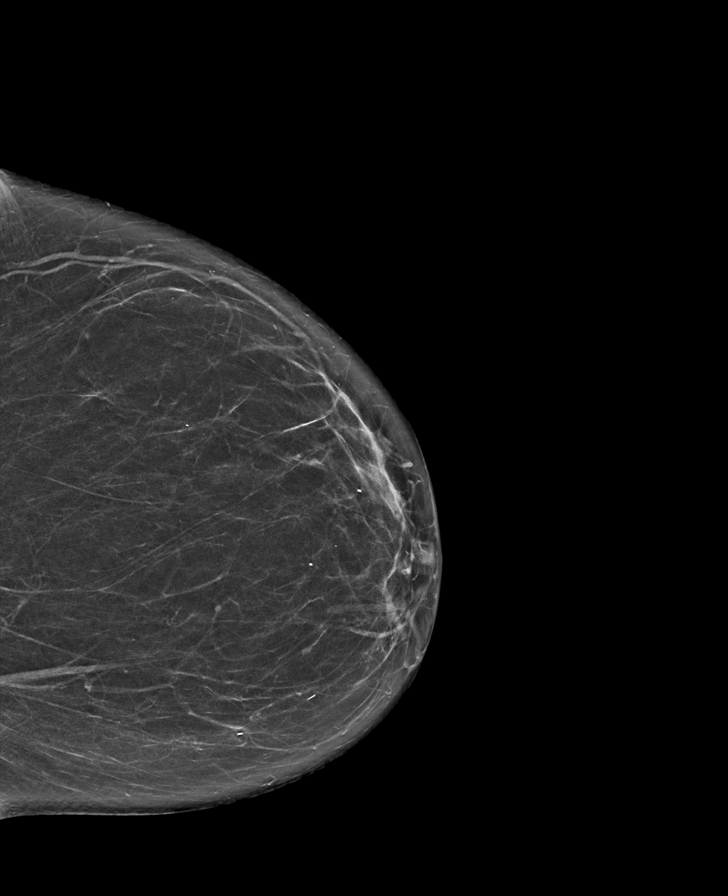

[R CC synth-2D]
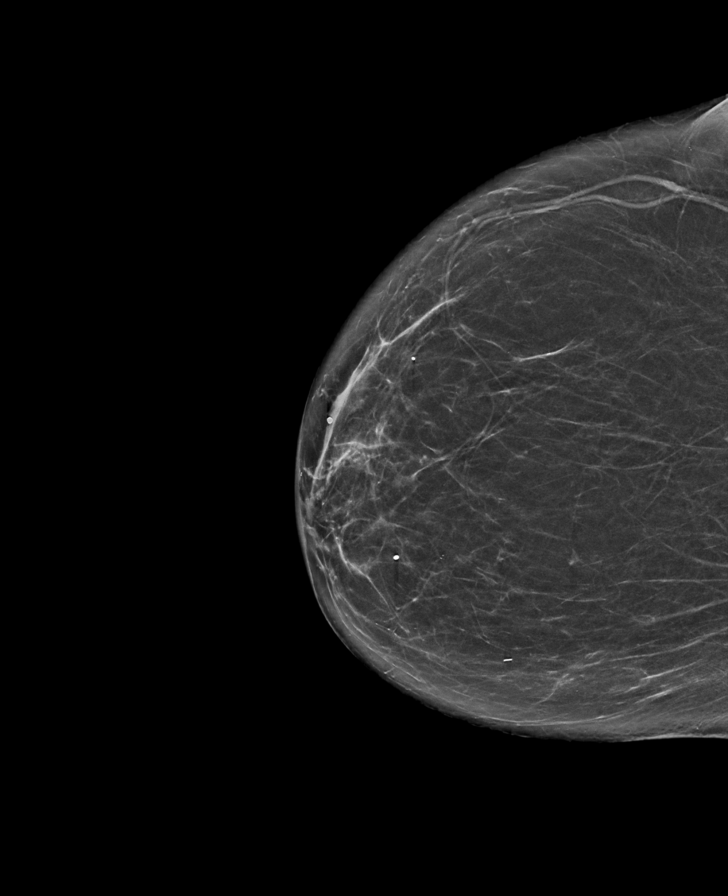

[R MLO synth-2D]
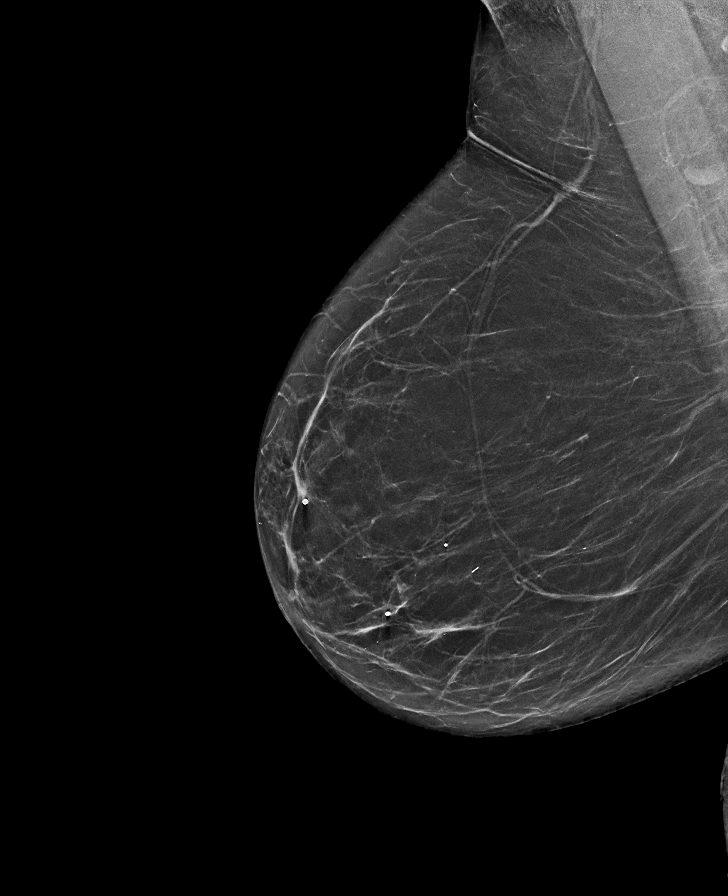

[R CC tomo · tomo slice 32/63.0]
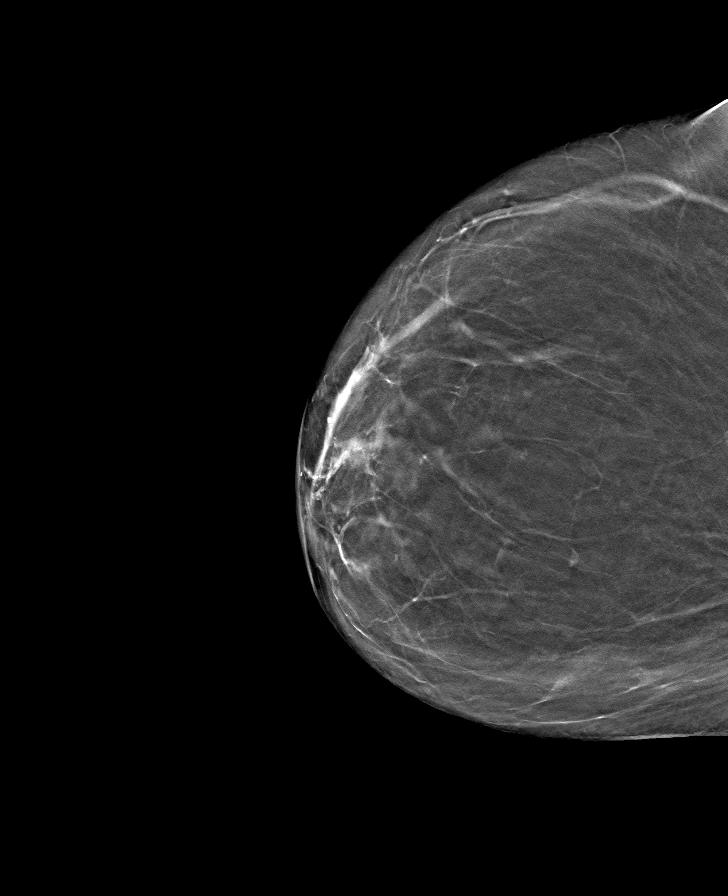

[L CC tomo · tomo slice 31/60.0]
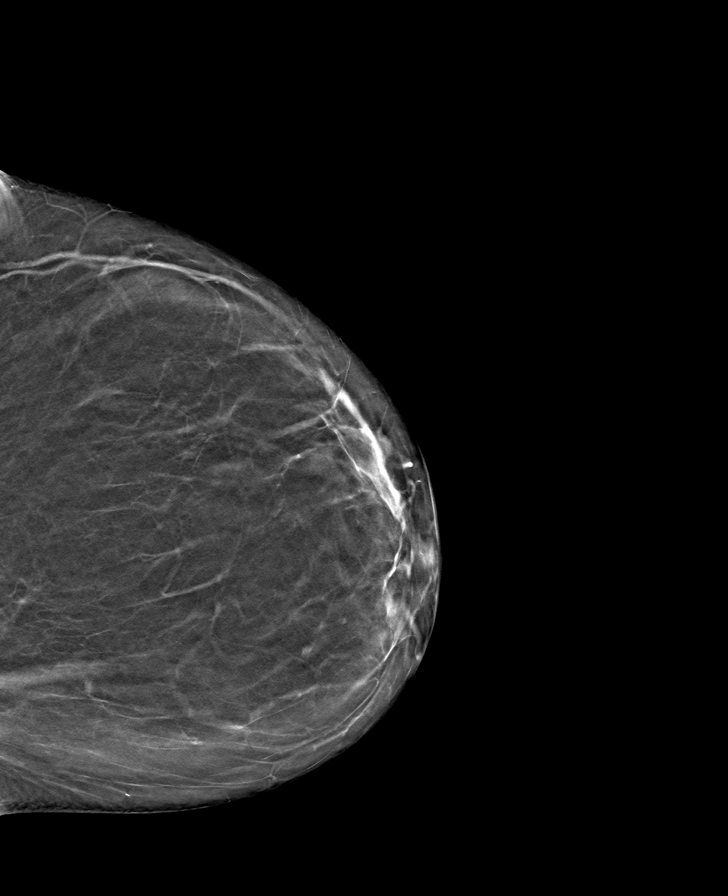

[L MLO tomo · tomo slice 31/62.0]
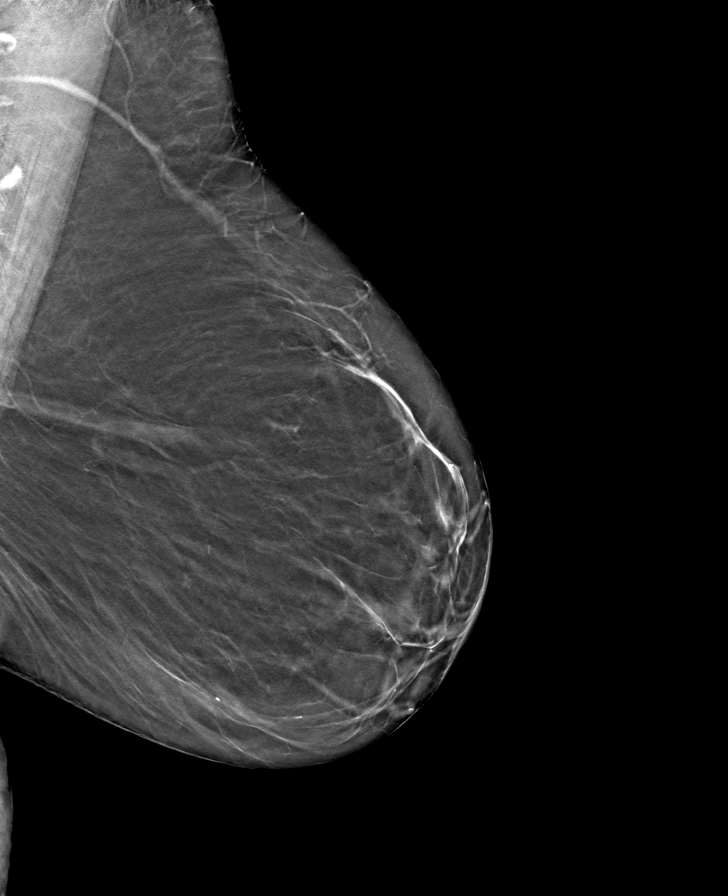

[R MLO tomo · tomo slice 32/63.0]
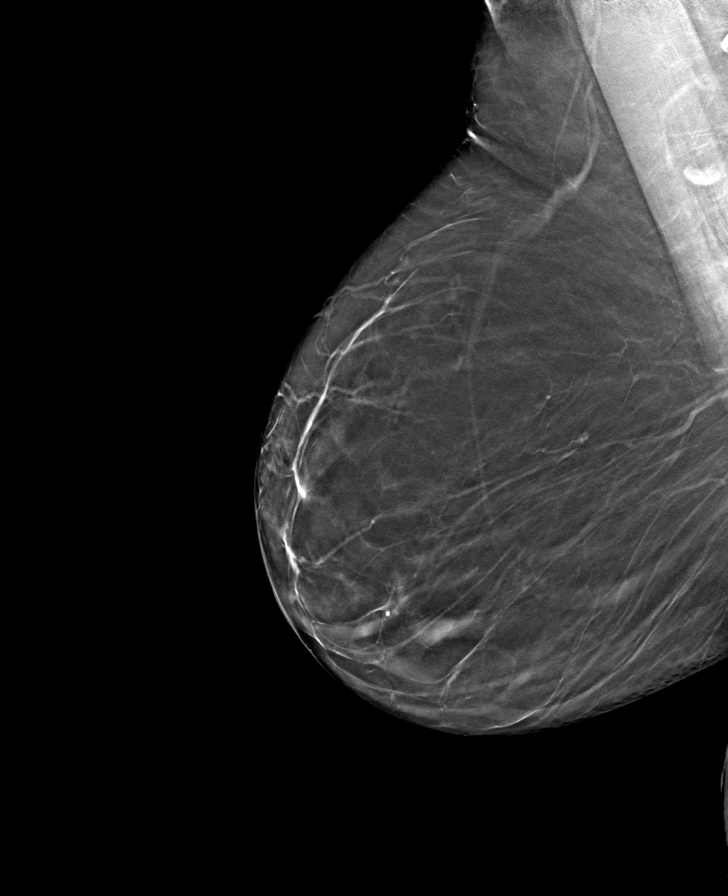

[8 of 24 positions shown; findings below may reference images not displayed]

ACR Breast Density Category b: There are scattered areas of
fibroglandular density.
FINDINGS: There are no findings suspicious for malignancy.
IMPRESSION: No mammographic evidence of malignancy. A result letter of this
screening mammogram will be mailed directly to the patient.

RECOMMENDATION:
Screening mammogram in one year. (Code:WO-V-ZRK)

BI-RADS CATEGORY  1: Negative.

## 2024-02-01 NOTE — Assessment & Plan Note (Addendum)
 Cellulitis of left lower extremity Superficial cellulitis likely secondary to a blood blister. No signs of osteomyelitis. Doxycycline  appears effective. - Prescribed doxycycline  for one week. - Advised to keep wound covered with a bandage when out. - Instructed to apply polysporin to the wound. - Instructed to monitor for spreading redness and report if it occurs. Orders:   doxycycline  (VIBRA -TABS) 100 MG tablet; Take 1 tablet (100 mg total) by mouth 2 (two) times daily.

## 2024-02-04 LAB — POCT URINALYSIS DIP (CLINITEK)
Bilirubin, UA: NEGATIVE
Blood, UA: NEGATIVE
Glucose, UA: NEGATIVE mg/dL
Ketones, POC UA: NEGATIVE mg/dL
Leukocytes, UA: NEGATIVE
Nitrite, UA: NEGATIVE
POC PROTEIN,UA: NEGATIVE
Spec Grav, UA: 1.015
Urobilinogen, UA: NEGATIVE U/dL — AB
pH, UA: 6.5

## 2024-02-05 ENCOUNTER — Other Ambulatory Visit: Payer: Self-pay | Admitting: Physician Assistant

## 2024-02-05 ENCOUNTER — Ambulatory Visit: Payer: Self-pay | Admitting: Physician Assistant

## 2024-02-06 ENCOUNTER — Other Ambulatory Visit: Payer: Self-pay | Admitting: Physician Assistant

## 2024-02-06 DIAGNOSIS — B3731 Acute candidiasis of vulva and vagina: Secondary | ICD-10-CM

## 2024-02-06 MED ORDER — FLUCONAZOLE 150 MG PO TABS: 150.0000 mg | ORAL_TABLET | Freq: Once | ORAL | 0 refills | Status: AC

## 2024-02-11 ENCOUNTER — Ambulatory Visit: Admitting: Family Medicine

## 2024-02-11 DIAGNOSIS — I10 Essential (primary) hypertension: Secondary | ICD-10-CM

## 2024-02-11 DIAGNOSIS — E782 Mixed hyperlipidemia: Secondary | ICD-10-CM

## 2024-02-25 ENCOUNTER — Ambulatory Visit: Admitting: Vascular Surgery

## 2024-02-25 ENCOUNTER — Other Ambulatory Visit (HOSPITAL_COMMUNITY)

## 2024-02-26 ENCOUNTER — Ambulatory Visit: Admitting: Internal Medicine

## 2024-03-05 ENCOUNTER — Ambulatory Visit: Admitting: Allergy and Immunology

## 2024-04-09 ENCOUNTER — Ambulatory Visit: Admitting: Oncology

## 2024-04-09 ENCOUNTER — Other Ambulatory Visit
